# Patient Record
Sex: Male | Born: 1960 | Race: Black or African American | Hispanic: No | Smoking: Current every day smoker
Health system: Southern US, Community
[De-identification: ages and names within clinical notes are randomized; demographics above are authoritative.]

## PROBLEM LIST (undated history)

## (undated) DIAGNOSIS — I10 Essential (primary) hypertension: Secondary | ICD-10-CM

## (undated) DIAGNOSIS — E785 Hyperlipidemia, unspecified: Secondary | ICD-10-CM

## (undated) DIAGNOSIS — E119 Type 2 diabetes mellitus without complications: Secondary | ICD-10-CM

## (undated) DIAGNOSIS — Z720 Tobacco use: Secondary | ICD-10-CM

## (undated) DIAGNOSIS — I739 Peripheral vascular disease, unspecified: Secondary | ICD-10-CM

## (undated) HISTORY — DX: Tobacco use: Z72.0

## (undated) HISTORY — DX: Hyperlipidemia, unspecified: E78.5

## (undated) HISTORY — PX: OTHER SURGICAL HISTORY: SHX169

---

## 1999-07-14 ENCOUNTER — Emergency Department (HOSPITAL_COMMUNITY): Admission: EM | Admit: 1999-07-14 | Discharge: 1999-07-14 | Payer: Self-pay | Admitting: Emergency Medicine

## 2003-12-09 ENCOUNTER — Ambulatory Visit: Payer: Self-pay | Admitting: Internal Medicine

## 2003-12-24 ENCOUNTER — Ambulatory Visit (HOSPITAL_COMMUNITY): Admission: RE | Admit: 2003-12-24 | Discharge: 2003-12-24 | Payer: Self-pay | Admitting: Internal Medicine

## 2004-01-23 ENCOUNTER — Ambulatory Visit: Payer: Self-pay | Admitting: Internal Medicine

## 2004-02-24 ENCOUNTER — Ambulatory Visit: Payer: Self-pay | Admitting: Internal Medicine

## 2004-02-27 ENCOUNTER — Ambulatory Visit (HOSPITAL_COMMUNITY): Admission: RE | Admit: 2004-02-27 | Discharge: 2004-02-27 | Payer: Self-pay | Admitting: Internal Medicine

## 2004-03-31 ENCOUNTER — Ambulatory Visit: Payer: Self-pay | Admitting: Internal Medicine

## 2004-06-01 ENCOUNTER — Observation Stay (HOSPITAL_COMMUNITY): Admission: RE | Admit: 2004-06-01 | Discharge: 2004-06-02 | Payer: Self-pay | Admitting: Orthopedic Surgery

## 2008-07-15 ENCOUNTER — Emergency Department (HOSPITAL_COMMUNITY): Admission: EM | Admit: 2008-07-15 | Discharge: 2008-07-15 | Payer: Self-pay | Admitting: Emergency Medicine

## 2009-07-11 ENCOUNTER — Emergency Department (HOSPITAL_COMMUNITY): Admission: EM | Admit: 2009-07-11 | Discharge: 2009-07-11 | Payer: Self-pay | Admitting: Emergency Medicine

## 2009-10-09 ENCOUNTER — Ambulatory Visit: Payer: Self-pay | Admitting: Internal Medicine

## 2009-10-09 ENCOUNTER — Encounter: Payer: Self-pay | Admitting: Physician Assistant

## 2009-10-09 LAB — CONVERTED CEMR LAB
ALT: 22 units/L (ref 0–53)
AST: 15 units/L (ref 0–37)
Albumin: 4.6 g/dL (ref 3.5–5.2)
Alkaline Phosphatase: 78 units/L (ref 39–117)
BUN: 16 mg/dL (ref 6–23)
Basophils Absolute: 0 10*3/uL (ref 0.0–0.1)
Basophils Relative: 0 % (ref 0–1)
CO2: 23 meq/L (ref 19–32)
Calcium: 9.9 mg/dL (ref 8.4–10.5)
Chloride: 106 meq/L (ref 96–112)
Creatinine, Ser: 1.25 mg/dL (ref 0.40–1.50)
Eosinophils Absolute: 0.1 10*3/uL (ref 0.0–0.7)
Eosinophils Relative: 2 % (ref 0–5)
Glucose, Bld: 100 mg/dL — ABNORMAL HIGH (ref 70–99)
HCT: 45.1 % (ref 39.0–52.0)
Hemoglobin: 15 g/dL (ref 13.0–17.0)
Lymphocytes Relative: 40 % (ref 12–46)
Lymphs Abs: 2.2 10*3/uL (ref 0.7–4.0)
MCHC: 33.3 g/dL (ref 30.0–36.0)
MCV: 89 fL (ref 78.0–100.0)
Monocytes Absolute: 0.7 10*3/uL (ref 0.1–1.0)
Monocytes Relative: 13 % — ABNORMAL HIGH (ref 3–12)
Neutro Abs: 2.4 10*3/uL (ref 1.7–7.7)
Neutrophils Relative %: 45 % (ref 43–77)
Platelets: 232 10*3/uL (ref 150–400)
Potassium: 4.2 meq/L (ref 3.5–5.3)
RBC: 5.07 M/uL (ref 4.22–5.81)
RDW: 15.5 % (ref 11.5–15.5)
Sodium: 140 meq/L (ref 135–145)
TSH: 2.137 microintl units/mL (ref 0.350–4.500)
Total Bilirubin: 0.4 mg/dL (ref 0.3–1.2)
Total Protein: 7.9 g/dL (ref 6.0–8.3)
WBC: 5.4 10*3/uL (ref 4.0–10.5)

## 2009-10-10 ENCOUNTER — Encounter: Payer: Self-pay | Admitting: Physician Assistant

## 2009-10-23 ENCOUNTER — Ambulatory Visit: Payer: Self-pay | Admitting: Internal Medicine

## 2009-10-23 DIAGNOSIS — I10 Essential (primary) hypertension: Secondary | ICD-10-CM | POA: Insufficient documentation

## 2009-11-06 ENCOUNTER — Ambulatory Visit: Payer: Self-pay | Admitting: Physician Assistant

## 2009-11-07 LAB — CONVERTED CEMR LAB
BUN: 15 mg/dL (ref 6–23)
CO2: 24 meq/L (ref 19–32)
Calcium: 9.6 mg/dL (ref 8.4–10.5)
Chloride: 106 meq/L (ref 96–112)
Creatinine, Ser: 0.97 mg/dL (ref 0.40–1.50)
Glucose, Bld: 122 mg/dL — ABNORMAL HIGH (ref 70–99)
Potassium: 4 meq/L (ref 3.5–5.3)
Sodium: 142 meq/L (ref 135–145)

## 2010-02-13 ENCOUNTER — Ambulatory Visit: Admit: 2010-02-13 | Payer: Self-pay | Admitting: Nurse Practitioner

## 2010-02-24 NOTE — Letter (Signed)
Summary: *HSN Results Follow up  [facility]  [address] [place], [place] [address]   Phone: [phone]  Fax: [phone]      10/10/2009   GRASON DRAWHORN [address] [place], [place]  [address]   Dear  Mr. ELII OSCARSON,                            ____S.Drinkard,FNP   ____D. Gore,FNP       ____B. McPherson,MD   ____V. Rankins,MD    ____E. Mulberry,MD    ____N. Hassell Done, FNP  ____D. Jobe Igo, MD    ____K. Tomma Lightning, MD    __x__S. Kathlen Mody, PA-C     This letter is to inform you that your recent test(s):  _______Pap Smear    ___x____Lab Test     _______X-ray    ___x____ is within acceptable limits  _______ requires a medication change  _______ requires a follow-up lab visit  _______ requires a follow-up visit with your provider   Comments:  Liver, kidney, thyroid function normal.  Blood counts normal.       _________________________________________________________ If you have any questions, please contact our office                     Sincerely,  Richardson Dopp PA-C [facility]

## 2010-02-24 NOTE — Assessment & Plan Note (Signed)
Summary: Elevated BP   Vital Signs:  Patient profile:   50 year old male Height:      63 inches Weight:      125.8 pounds BMI:     22.37 Temp:     98.5 degrees F oral Pulse rate:   88 / minute Pulse rhythm:   regular Resp:     18 per minute BP sitting:   140 / 90  (left arm) Cuff size:   regular  Vitals Entered By: [place] Shannon (October 09, 2009 2:30 PM) CC: NP... pt is concerned about bp and would like a cpe... Is Patient Diabetic? No Pain Assessment Patient in pain? no       Does patient need assistance? Functional Status Self care Ambulation Normal   Primary Care Provider:  Richardson Dopp, PA-C  CC:  NP... pt is concerned about bp and would like a cpe....  History of Present Illness: Notes BP high during donating plasma.  Sounds like diastolic in [id number] range on multiple visits. Denies chest pain, sob, syncope, headaches.  No real complaints. Wants to get a physical. Needs a note that says he can donate plasma.    Habits & Providers  Alcohol-Tobacco-Diet     Tobacco Status: current  Exercise-Depression-Behavior     Drug Use: no  Current Medications (verified): 1)  None  Allergies (verified): No Known Drug Allergies  Past History:  Past Medical History: Unremarkable  Past Surgical History: Rotator cuff repair - right shoulder  Family History: Family History Diabetes 1st degree relative - bro, dad  Social History: Married-separated Current Smoker - 1/2 ppd Alcohol use-yes (on weekends) Drug use-no Smoking Status:  current Drug Use:  no  Review of Systems      See HPI General:  Denies chills and fever. CV:  Denies chest pain or discomfort and shortness of breath with exertion. Resp:  Denies cough. GI:  Denies bloody stools and dark tarry stools. GU:  Denies dysuria. Neuro:  Denies headaches.  Physical Exam  General:  alert, well-developed, and well-nourished.   Head:  normocephalic and atraumatic.   Eyes:  pupils equal, pupils  round, and pupils reactive to light.  fundi diff to visualize Neck:  supple and no carotid bruits.   Lungs:  normal breath sounds, no crackles, and no wheezes.   Heart:  normal rate and regular rhythm.   Abdomen:  soft and non-tender.   Neurologic:  alert & oriented X3 and cranial nerves II-XII intact.   Psych:  normally interactive.     Impression & Recommendations:  Problem # 1:  ELEVATED BP READING WITHOUT DX HYPERTENSION (ICD-796.2)  initiate TLC first handout on salt get ECG. . . has LVH; will likely need tx. bp checks start med if consistently above 140/90 consider echo. . . discuss at f/u  Orders: T-Comprehensive Metabolic Panel (A999333) T-CBC w/Diff [phone]) T-TSH [phone]) EKG w/ Interpretation (93000)  Problem # 2:  [place] (ICD-V70.0)  schedule cpe  Orders: T-Comprehensive Metabolic Panel (A999333)  Problem # 3:  FAMILY HISTORY DIABETES 1ST DEGREE RELATIVE (ICD-V18.0)  Orders: T-Comprehensive Metabolic Panel (A999333)  Patient Instructions: 1)  Return for BP check with the nurse in 2 weeks, then 4 weeks. 2)  Schedule CPE with Scott in 6 weeks. 3)  Try to follow low salt diet. 4)  It is important that you exercise reguarly at least 20 minutes 5 times a week. If you develop chest pain, have severe difficulty breathing, or feel very tired, stop exercising  immediately and seek medical attention.    EKG  Procedure date:  10/09/2009  Findings:      Normal sinus rhythm with rate of:  77 normal axis LVH PRWP LAE no isch changes

## 2010-02-24 NOTE — Assessment & Plan Note (Signed)
Summary: BP recheck  Nurse Visit   Vital Signs:  Patient profile:   50 year old male Pulse rate:   64 / minute Pulse rhythm:   regular Resp:     20 per minute BP sitting:   148 / 90  (right arm) Cuff size:   regular  Vitals Entered By: Sherian Maroon RN (October 23, 2009 9:32 AM)  Primary Provider:  Richardson Dopp, PA-C  CC:  BP check.  History of Present Illness: Last visit BP 140/90.  In office for f/u BP check.  Currently has no medications for treatment. Is a smoker, last cigarette was last night.   Review of Systems CV:  Denies bluish discoloration of lips or nails, chest pain or discomfort, difficulty breathing at night, difficulty breathing while lying down, fainting, fatigue, leg cramps with exertion, lightheadness, near fainting, palpitations, shortness of breath with exertion, swelling of feet, swelling of hands, and weight gain; Denies headache, visual changes..   Impression & Recommendations:  Problem # 1:  ESSENTIAL HYPERTENSION, BENIGN (ICD-401.1)  BP remains high needs tx (LVH on ECG) start hctz repeat bp and bmet in 2 weeks  His updated medication list for this problem includes:    Hydrochlorothiazide 25 Mg Tabs (Hydrochlorothiazide) .Marland Kitchen... 1/2 by mouth once daily for blood pressure  Complete Medication List: 1)  Hydrochlorothiazide 25 Mg Tabs (Hydrochlorothiazide) .... 1/2 by mouth once daily for blood pressure   Physical Exam  Lungs:  normal respiratory effort, normal breath sounds, no crackles, and no wheezes.   Heart:  normal rate and regular rhythm.     Patient Instructions: 1)  Reviewed with S. Weaver 2)  Blood pressure today is 148/90. 3)  You need to start medication. 4)  Take HCTZ 25 mg 1/2 tab once daily for blood pressure. 5)  Return in 2 weeks for labs (BMET) and BP check with the nurse.  CC: BP check Is Patient Diabetic? No Pain Assessment Patient in pain? no        Allergies: No Known Drug Allergies  Orders Added: 1)   Est. Patient Level II [id number] Prescriptions: HYDROCHLOROTHIAZIDE 25 MG TABS (HYDROCHLOROTHIAZIDE) 1/2 by mouth once daily for blood pressure  #15 x 5   Entered by:   Sherian Maroon RN   Authorized by:   Richardson Dopp PA-C   Signed by:   Sherian Maroon RN on 10/23/2009   Method used:   Print then Give to Patient   RxID:   [id number]

## 2010-02-24 NOTE — Letter (Signed)
Summary: PT INFORMATION SHEET  PT INFORMATION SHEET   Imported By: Roland Earl 10/10/2009 12:36:35  _____________________________________________________________________  External Attachment:    Type:   Image     Comment:   External Document

## 2010-06-12 NOTE — Op Note (Signed)
[name], [name] NO.:  [id number]   MEDICAL RECORD NO.:  [id number]          PATIENT TYPE:  AMB   LOCATION:  DAY                          FACILITY:  [facility]   PHYSICIAN:  Kipp Brood. Gioffre, M.D.DATE OF BIRTH:  21-Feb-1960   DATE OF PROCEDURE:  06/01/2004  DATE OF DISCHARGE:                                 OPERATIVE REPORT   SURGEON:  Kipp Brood. Gladstone Lighter, M.D.   ASSISTANT:  Mardene Sayer, P.A.   PREOPERATIVE DIAGNOSES:  1.  Complete retracted tear of the rotator cuff tendon, right shoulder.  2.  Severe impingement syndrome, right shoulder.   POSTOPERATIVE DIAGNOSES:  1.  Complete retracted tear of the rotator cuff tendon, right shoulder.  2.  Severe impingement syndrome, right shoulder.   OPERATION:  1.  A partial acromionectomy and acromioplasty, right shoulder.  2.  Repair of a severely torn retracted rotator cuff tendon, right shoulder.      I utilized the double thickness tissue mend 4 x 4 graft with three multi-      tack sutures.   DESCRIPTION OF PROCEDURE:  Under general anesthesia, a routine orthopedic  prep and drape of the right shoulder was carried out.  An incision was made  over the anterior aspect of the right shoulder.  Bleeders were identified  and cauterized.  Self-retaining retractors were inserted.  I detached the  deltoid tendon by sharp dissection from the acromion.  He had a severe  impingement-type syndrome.  The acromion was literally thickened and beak-  shaped like an eagle's beak, and after I protected the underlying soft  tissue structures and did my acromionectomy in the usual fashion, I then  identified the rotator cuff.  It was severely retracted and torn.  I had to  take it medially piece-meal and suture a portion of it to the long head of  the biceps for reinforcement there.  I had to bring some sutures across the  piece that was attached more laterally.  Once I repaired that proximal  portion, I was then able to bur down the  lateral articular surface of the  humerus.  I inserted three multi-tack suture.  Then at this particular time  I utilized a double thickness 4 x 4 tissue __________ graft.  I sutured out  to the remaining tendon, after I brought it forward as much as possible.  I  then sutured it, anchored it down in place to the proximal humerus.  I  thoroughly irrigated out the area.  We then repaired the deltoid tendon and  muscle in the usual fashion.  The subcutaneous was closed with #0 Vicryl.  The skin was closed with metal staples.  I injected 10 mL of 0.5% Marcaine  with epinephrine in the wound site.  A sterile Neosporin dressing was  applied.  He was placed in a shoulder immobilizer.      RAG/MEDQ  D:  06/01/2004  T:  06/01/2004  Job:  [id number]

## 2014-10-14 ENCOUNTER — Encounter (HOSPITAL_COMMUNITY): Payer: Self-pay | Admitting: *Deleted

## 2014-10-14 ENCOUNTER — Emergency Department (HOSPITAL_COMMUNITY)
Admission: EM | Admit: 2014-10-14 | Discharge: 2014-10-15 | Disposition: A | Discharge status: Home / Self Care | Payer: Medicare Other | Attending: Emergency Medicine | Admitting: Emergency Medicine

## 2014-10-14 DIAGNOSIS — F1010 Alcohol abuse, uncomplicated: Secondary | ICD-10-CM | POA: Diagnosis not present

## 2014-10-14 DIAGNOSIS — F1410 Cocaine abuse, uncomplicated: Secondary | ICD-10-CM | POA: Diagnosis not present

## 2014-10-14 DIAGNOSIS — Z720 Tobacco use: Secondary | ICD-10-CM | POA: Insufficient documentation

## 2014-10-14 DIAGNOSIS — F1910 Other psychoactive substance abuse, uncomplicated: Secondary | ICD-10-CM

## 2014-10-14 DIAGNOSIS — F1210 Cannabis abuse, uncomplicated: Secondary | ICD-10-CM | POA: Insufficient documentation

## 2014-10-14 LAB — RAPID URINE DRUG SCREEN, HOSP PERFORMED
Amphetamines: NOT DETECTED
Barbiturates: NOT DETECTED
Benzodiazepines: NOT DETECTED
Cocaine: POSITIVE — AB
Opiates: NOT DETECTED
Tetrahydrocannabinol: POSITIVE — AB

## 2014-10-14 LAB — CBC
HCT: 46.2 % (ref 39.0–52.0)
Hemoglobin: 14.8 g/dL (ref 13.0–17.0)
MCH: 30.1 pg (ref 26.0–34.0)
MCHC: 32 g/dL (ref 30.0–36.0)
MCV: 93.9 fL (ref 78.0–100.0)
Platelets: 311 10*3/uL (ref 150–400)
RBC: 4.92 MIL/uL (ref 4.22–5.81)
RDW: 14.8 % (ref 11.5–15.5)
WBC: 5.5 10*3/uL (ref 4.0–10.5)

## 2014-10-14 LAB — ETHANOL: Alcohol, Ethyl (B): <5 mg/dL (ref <5)

## 2014-10-14 LAB — ACETAMINOPHEN LEVEL: Acetaminophen (Tylenol), Serum: <10 ug/mL — ABNORMAL LOW (ref 10–30)

## 2014-10-14 LAB — COMPREHENSIVE METABOLIC PANEL
ALT: 21 U/L (ref 17–63)
AST: 24 U/L (ref 15–41)
Albumin: 3.2 g/dL — ABNORMAL LOW (ref 3.5–5.0)
Alkaline Phosphatase: 84 U/L (ref 38–126)
Anion gap: 8 (ref 5–15)
BUN: 19 mg/dL (ref 6–20)
CO2: 23 mmol/L (ref 22–32)
Calcium: 9.1 mg/dL (ref 8.9–10.3)
Chloride: 110 mmol/L (ref 101–111)
Creatinine, Ser: 1.2 mg/dL (ref 0.61–1.24)
GFR calc Af Amer: >60 mL/min (ref >60)
GFR calc non Af Amer: >60 mL/min (ref >60)
Glucose, Bld: 87 mg/dL (ref 65–99)
Potassium: 4.3 mmol/L (ref 3.5–5.1)
Sodium: 141 mmol/L (ref 135–145)
Total Bilirubin: 0.4 mg/dL (ref 0.3–1.2)
Total Protein: 6 g/dL — ABNORMAL LOW (ref 6.5–8.1)

## 2014-10-14 LAB — SALICYLATE LEVEL: Salicylate Lvl: <4 mg/dL (ref 2.8–30.0)

## 2014-10-14 MED ORDER — THIAMINE HCL 100 MG/ML IJ SOLN: 100.0000 mg | Freq: Every day | INTRAMUSCULAR | Status: DC

## 2014-10-14 MED ORDER — VITAMIN B-1 100 MG PO TABS: 100.0000 mg | ORAL_TABLET | Freq: Every day | ORAL | Status: DC

## 2014-10-14 MED ORDER — LORAZEPAM 1 MG PO TABS: 0.0000 mg | ORAL_TABLET | Freq: Four times a day (QID) | ORAL | Status: DC

## 2014-10-14 MED ORDER — LORAZEPAM 1 MG PO TABS: 0.0000 mg | ORAL_TABLET | Freq: Two times a day (BID) | ORAL | Status: DC

## 2014-10-14 NOTE — ED Notes (Signed)
Staffing notified of need of sitter. Pt changed in paper scrubs. Security notified to wand pt.

## 2014-10-14 NOTE — ED Notes (Signed)
PA at bedside.

## 2014-10-14 NOTE — ED Notes (Signed)
Pt requesting detox from alcohol, cocaine and marijuana. Last alcohol, cocaine and marijuana use was last night. Requesting to be sent to rehab facility.

## 2014-10-14 NOTE — ED Provider Notes (Signed)
CSN: [id number]     Arrival date & time 10/14/14  1752 History  This chart was scribed for non-physician practitioner Hyman Bible, PA-C working with Charlesetta Shanks, MD by Hilda Lias, ED Scribe. This patient was seen in room C21C/C21C and the patient's care was started at 9:33 PM.  Chief Complaint  Patient presents with  . Alcohol Problem  . Drug Problem  . Homicidal      The history is provided by the patient. No language interpreter was used.   HPI Comments: NEEV MCBURNIE is a 54 y.o. male who presents to the Emergency Department requesting detox from alcohol, cocaine, and marijuana use. Pt states that his last use of all three was last night, and states that he uses all three of them at the same time every other day. Pt denies any symptoms of withdrawal at this time.   No history of DT's.  Pt states he has had homicidal ideations, and states he thought about stabbing someone and killing them earlier because they took his money.  Denies SI.  He denies any physical symptoms at this time.     History reviewed. No pertinent past medical history. Past Surgical History  Procedure Laterality Date  . Arm surgery     No family history on file. Social History  Substance Use Topics  . Smoking status: Current Every Day Smoker -- 0.50 packs/day    Types: Cigarettes  . Smokeless tobacco: None  . Alcohol Use: Yes     Comment: beer and liquor daily    Review of Systems  Psychiatric/Behavioral: Positive for behavioral problems. Negative for suicidal ideas and hallucinations.  All other systems reviewed and are negative.     Allergies  Review of patient's allergies indicates no known allergies.  Home Medications   Prior to Admission medications   Not on File   BP 129/76 mmHg  Pulse 80  Temp(Src) 98.4 F (36.9 C) (Oral)  Resp 18  Ht 5\' 2"  (1.575 m)  Wt 120 lb (54.432 kg)  BMI 21.94 kg/m2  SpO2 98% Physical Exam  Constitutional: He is oriented to person, place, and  time. He appears well-developed and well-nourished.  HENT:  Head: Normocephalic and atraumatic.  Neck: Normal range of motion. Neck supple.  Cardiovascular: Normal rate, regular rhythm and normal heart sounds.   Pulmonary/Chest: Effort normal and breath sounds normal.  Abdominal: He exhibits no distension.  Musculoskeletal: Normal range of motion.  Neurological: He is alert and oriented to person, place, and time.  Skin: Skin is warm and dry.  Psychiatric: He has a normal mood and affect. His speech is normal. He is not actively hallucinating. He expresses no homicidal and no suicidal ideation. He expresses no suicidal plans and no homicidal plans.  Nursing note and vitals reviewed.   ED Course  Procedures (including critical care time)  DIAGNOSTIC STUDIES: Oxygen Saturation is 98% on room air, normal by my interpretation.    COORDINATION OF CARE: 9:36 PM Discussed treatment plan with pt at bedside and pt agreed to plan.   Labs Review Labs Reviewed  COMPREHENSIVE METABOLIC PANEL - Abnormal; Notable for the following:    Total Protein 6.0 (*)    Albumin 3.2 (*)    All other components within normal limits  ACETAMINOPHEN LEVEL - Abnormal; Notable for the following:    Acetaminophen (Tylenol), Serum <10 (*)    All other components within normal limits  ETHANOL  SALICYLATE LEVEL  CBC  URINE RAPID DRUG SCREEN, HOSP PERFORMED  Imaging Review No results found. I have personally reviewed and evaluated these images and lab results as part of my medical decision-making.   EKG Interpretation None      MDM   Final diagnoses:  None  Patient presents today requesting detox from alcohol and cocaine.  He is also expressing HI with a plan to stab a person that took his money.  He denies HI.  VSS.  He denies physical complaints.  Labs unremarkable.  CIWA orders placed.  Psych holding orders also placed.  TTS consulted.  I personally performed the services described in this  documentation, which was scribed in my presence. The recorded information has been reviewed and is accurate.    Hyman Bible, PA-C 10/14/14 2329  Charlesetta Shanks, MD 10/15/14 [phone]

## 2014-10-15 ENCOUNTER — Emergency Department (HOSPITAL_COMMUNITY)
Admission: EM | Admit: 2014-10-15 | Discharge: 2014-10-15 | Disposition: A | Discharge status: Home / Self Care | Payer: Medicare Other | Source: Home / Self Care | Attending: Emergency Medicine | Admitting: Emergency Medicine

## 2014-10-15 ENCOUNTER — Emergency Department (HOSPITAL_COMMUNITY)
Admission: EM | Admit: 2014-10-15 | Discharge: 2014-10-15 | Disposition: A | Discharge status: Home / Self Care | Payer: Medicaid Other | Attending: Emergency Medicine | Admitting: Emergency Medicine

## 2014-10-15 ENCOUNTER — Encounter (HOSPITAL_COMMUNITY): Payer: Self-pay | Admitting: *Deleted

## 2014-10-15 ENCOUNTER — Encounter (HOSPITAL_COMMUNITY): Payer: Self-pay

## 2014-10-15 DIAGNOSIS — F1210 Cannabis abuse, uncomplicated: Secondary | ICD-10-CM | POA: Insufficient documentation

## 2014-10-15 DIAGNOSIS — Z720 Tobacco use: Secondary | ICD-10-CM | POA: Insufficient documentation

## 2014-10-15 DIAGNOSIS — F1010 Alcohol abuse, uncomplicated: Secondary | ICD-10-CM

## 2014-10-15 DIAGNOSIS — F1410 Cocaine abuse, uncomplicated: Secondary | ICD-10-CM | POA: Insufficient documentation

## 2014-10-15 DIAGNOSIS — F1910 Other psychoactive substance abuse, uncomplicated: Secondary | ICD-10-CM

## 2014-10-15 DIAGNOSIS — E119 Type 2 diabetes mellitus without complications: Secondary | ICD-10-CM | POA: Insufficient documentation

## 2014-10-15 HISTORY — DX: Type 2 diabetes mellitus without complications: E11.9

## 2014-10-15 NOTE — BH Assessment (Signed)
Tele Assessment Note   [name] is a 54 y.o. male who voluntarily presents to [facility] for detox.  Upon arrival, pt told medical staff that he was HI and thought about stabbing someone and killing them because they took his money.  He no longer endorses HI and denies SI/AVH.  Pt reports the following: he drinks 2-40's, daily and his last drink was 10/13/14.  Pt.'s current BAL <5.  He also uses $20 worth of cocaine, every other day.  His last use was 10/13/14, he used $50 worth of cocaine.  Pt smokes 2 blunts, every other day and his last use was 10/13/14, he used  $20 worth of marijuana.  Pr denies w/d sxs, no seizure/blackout hx and no legal issues.  Pt has no past detox/rehab hx and is requesting help with rehab.    Axis I: Alcohol use disorder, Severe; Cannabis use disorder, Moderate;Cocaine use disorder, Severe  Axis II: Deferred Axis III: History reviewed. No pertinent past medical history. Axis IV: other psychosocial or environmental problems, problems related to social environment and problems with primary support group Axis V: 41-50 serious symptoms  Past Medical History: History reviewed. No pertinent past medical history.  Past Surgical History  Procedure Laterality Date  . Arm surgery      Family History: No family history on file.  Social History:  reports that he has been smoking Cigarettes.  He has been smoking about 0.50 packs per day. He does not have any smokeless tobacco history on file. He reports that he drinks alcohol. He reports that he uses illicit drugs (Cocaine and Marijuana) about 3 times per week.  Additional Social History:  Alcohol / Drug Use Pain Medications: None  Prescriptions: None  Over the Counter: None  History of alcohol / drug use?: Yes Longest period of sobriety (when/how long): None  Negative Consequences of Use: Work / Youth worker, Charity fundraiser relationships Withdrawal Symptoms: Other (Comment) (No current w/d sxs ) Substance #1 Name of Substance 1:  Alcohol  1 - Age of First Use: 15 YOM  1 - Amount (size/oz): 2-40's 1 - Frequency: Daily  1 - Duration: On-going  1 - Last Use / Amount: 10/13/14 Substance #2 Name of Substance 2: Cocaine  2 - Age of First Use: 20 YOM  2 - Amount (size/oz): $20 2 - Frequency: Every Other Day  2 - Duration: On-going  2 - Last Use / Amount: 10/13/14 Substance #3 Name of Substance 3: THC  3 - Age of First Use: 11 YOM  3 - Amount (size/oz): 2 Blunts  3 - Frequency: Every Other Day  3 - Duration: On-going  3 - Last Use / Amount: 10/13/14  CIWA: CIWA-Ar BP: 129/77 mmHg Pulse Rate: 79 Nausea and Vomiting: no nausea and no vomiting Tactile Disturbances: none Tremor: no tremor Auditory Disturbances: not present Paroxysmal Sweats: no sweat visible Visual Disturbances: not present Anxiety: no anxiety, at ease Headache, Fullness in Head: none present Agitation: normal activity Orientation and Clouding of Sensorium: oriented and can do serial additions CIWA-Ar Total: 0 COWS: Clinical Opiate Withdrawal Scale (COWS) Resting Pulse Rate: Pulse Rate 80 or below Sweating: No report of chills or flushing Restlessness: Able to sit still Pupil Size: Pupils pinned or normal size for room light Bone or Joint Aches: Not present Runny Nose or Tearing: Not present GI Upset: No GI symptoms Tremor: No tremor Yawning: No yawning Anxiety or Irritability: None Gooseflesh Skin: Skin is smooth COWS Total Score: 0  PATIENT STRENGTHS: (choose at  least two) Armed forces logistics/support/administrative officer Motivation for treatment/growth  Allergies: No Known Allergies  Home Medications:  (Not in a hospital admission)  OB/GYN Status:  No LMP for male patient.  General Assessment Data Location of Assessment: [facility] ED TTS Assessment: In system Is this a Tele or Face-to-Face Assessment?: Tele Assessment Is this an Initial Assessment or a Re-assessment for this encounter?: Initial Assessment Marital status: Single Maiden name: None  Is  patient pregnant?: No Pregnancy Status: No Living Arrangements: Alone Can pt return to current living arrangement?: Yes Admission Status: Voluntary Is patient capable of signing voluntary admission?: Yes Referral Source: MD Insurance type: MCD  Medical Screening Exam ([place]) Medical Exam completed: No Reason for MSE not completed: Other: (None )  Crisis Care Plan Living Arrangements: Alone Name of Psychiatrist: None  Name of Therapist: None   Education Status Is patient currently in school?: No Current Grade: None  Highest grade of school patient has completed: None  Name of school: None  Contact person: None   Risk to self with the past 6 months Suicidal Ideation: No Has patient been a risk to self within the past 6 months prior to admission? : No Suicidal Intent: No Has patient had any suicidal intent within the past 6 months prior to admission? : No Is patient at risk for suicide?: No Suicidal Plan?: No Has patient had any suicidal plan within the past 6 months prior to admission? : No Access to Means: No What has been your use of drugs/alcohol within the last 12 months?: Pt denies  Previous Attempts/Gestures: No How many times?: 0 Other Self Harm Risks: None Triggers for Past Attempts: None known Intentional Self Injurious Behavior: None Family Suicide History: No Recent stressful life event(s): Other (Comment) (Chronic SA ) Persecutory voices/beliefs?: No Depression: Yes Depression Symptoms: Loss of interest in usual pleasures Substance abuse history and/or treatment for substance abuse?: Yes Suicide prevention information given to non-admitted patients: Not applicable  Risk to Others within the past 6 months Homicidal Ideation: No Does patient have any lifetime risk of violence toward others beyond the six months prior to admission? : No Thoughts of Harm to Others: No Current Homicidal Intent: No Current Homicidal Plan: No Access to Homicidal  Means: No Identified Victim: None  History of harm to others?: No Assessment of Violence: None Noted Violent Behavior Description: None  Does patient have access to weapons?: No Criminal Charges Pending?: No Does patient have a court date: No Is patient on probation?: No  Psychosis Hallucinations: None noted Delusions: None noted  Mental Status Report Appearance/Hygiene: In scrubs Eye Contact: Fair Motor Activity: Unremarkable Speech: Logical/coherent Level of Consciousness: Alert, Quiet/awake Mood: Other (Comment) (Appropriate ) Affect: Appropriate to circumstance Anxiety Level: None Thought Processes: Coherent, Relevant Judgement: Unimpaired Orientation: Person, Place, Time, Situation Obsessive Compulsive Thoughts/Behaviors: None  Cognitive Functioning Concentration: Normal Memory: Recent Intact, Remote Intact IQ: Average Insight: Fair Impulse Control: Fair Appetite: Good Weight Loss: 0 Weight Gain: 0 Sleep: No Change Total Hours of Sleep: 5 Vegetative Symptoms: None  ADLScreening [facility] Assessment Services) Patient's cognitive ability adequate to safely complete daily activities?: Yes Patient able to express need for assistance with ADLs?: Yes Independently performs ADLs?: Yes (appropriate for developmental age)  Prior Inpatient Therapy Prior Inpatient Therapy: No Prior Therapy Dates: None  Prior Therapy Facilty/Shanielle Correll(s): None  Reason for Treatment: None   Prior Outpatient Therapy Prior Outpatient Therapy: No Prior Therapy Dates: None  Prior Therapy Facilty/Jackelin Correia(s): None  Reason for Treatment: None  Does  patient have an ACCT team?: No Does patient have Intensive In-House Services?  : No Does patient have [facility] services? : No Does patient have P4CC services?: No  ADL Screening (condition at time of admission) Patient's cognitive ability adequate to safely complete daily activities?: Yes Is the patient deaf or have difficulty hearing?: No Does  the patient have difficulty seeing, even when wearing glasses/contacts?: No Does the patient have difficulty concentrating, remembering, or making decisions?: Yes Patient able to express need for assistance with ADLs?: Yes Does the patient have difficulty dressing or bathing?: No Independently performs ADLs?: Yes (appropriate for developmental age) Does the patient have difficulty walking or climbing stairs?: No Weakness of Legs: None Weakness of Arms/Hands: None  Home Assistive Devices/Equipment Home Assistive Devices/Equipment: None  Therapy Consults (therapy consults require a physician order) PT Evaluation Needed: No OT Evalulation Needed: No SLP Evaluation Needed: No Abuse/Neglect Assessment (Assessment to be complete while patient is alone) Physical Abuse: Denies Verbal Abuse: Denies Sexual Abuse: Denies Exploitation of patient/patient's resources: Denies Self-Neglect: Denies Values / Beliefs Cultural Requests During Hospitalization: None Spiritual Requests During Hospitalization: None Consults Spiritual Care Consult Needed: No Social Work Consult Needed: No Regulatory affairs officer (For Healthcare) Does patient have an advance directive?: No Would patient like information on creating an advanced directive?: No - patient declined information    Additional Information 1:1 In Past 12 Months?: No CIRT Risk: No Elopement Risk: No Does patient have medical clearance?: Yes     Disposition:  Disposition Initial Assessment Completed for this Encounter: Yes Disposition of Patient: Referred to (Per Patriciaann Clan, PA recommends ) Patient referred to: Other (Comment) (Per Patriciaann Clan, PA recommends )  Polo Riley C 10/15/2014 12:29 AM

## 2014-10-15 NOTE — ED Provider Notes (Signed)
CSN: [id number]     Arrival date & time 10/15/14  1348 History   First MD Initiated Contact with Patient 10/15/14 1505     Chief Complaint  Patient presents with  . Addiction Problem     (Consider location/radiation/quality/duration/timing/severity/associated sxs/prior Treatment) The history is provided by the patient.  [name] is a 54 y.o. male hx of DM, drug and alcohol abuse here requesting detox and a place to stay. He went to [facility] earlier today for cocaine, marijuana, alcohol abuse and was given a list of resources. [facility] and is suppose to go there in a week for 90 day detox. He is homeless and requesting a place to stay. Denies chest pain or abdominal pain or vomiting. Denies fevers or shakiness or seizures. He doesn't want medications to help him with detox.   Past Medical History  Diagnosis Date  . Diabetes mellitus without complication    Past Surgical History  Procedure Laterality Date  . Arm surgery     No family history on file. Social History  Substance Use Topics  . Smoking status: Current Every Day Smoker -- 0.50 packs/day    Types: Cigarettes  . Smokeless tobacco: None  . Alcohol Use: Yes     Comment: beer and liquor daily    Review of Systems  Psychiatric/Behavioral: Negative for suicidal ideas, sleep disturbance and self-injury.  All other systems reviewed and are negative.     Allergies  Review of patient's allergies indicates no known allergies.  Home Medications   Prior to Admission medications   Not on File   BP 151/87 mmHg  Pulse 70  Temp(Src) 98.1 F (36.7 C) (Oral)  Resp 20  SpO2 99% Physical Exam  Constitutional: He appears well-developed and well-nourished.  Disheveled   HENT:  Head: Normocephalic.  Mouth/Throat: Oropharynx is clear and moist.  Eyes: Conjunctivae are normal. Pupils are equal, round, and reactive to light.  Neck: Normal range of motion. Neck supple.  Cardiovascular: Normal rate, regular  rhythm and normal heart sounds.   Pulmonary/Chest: Effort normal and breath sounds normal. No respiratory distress. He has no wheezes. He has no rales.  Abdominal: Soft. Bowel sounds are normal. He exhibits no distension. There is no tenderness. There is no rebound.  Musculoskeletal: Normal range of motion.  Neurological: He is alert. No cranial nerve deficit. Coordination normal.  Skin: Skin is warm and dry.  Psychiatric: He has a normal mood and affect. His behavior is normal. Judgment and thought content normal.  Nursing note and vitals reviewed.   ED Course  Procedures (including critical care time) Labs Review Labs Reviewed - No data to display  Imaging Review No results found. I have personally reviewed and evaluated these images and lab results as part of my medical decision-making.   EKG Interpretation None      MDM   Final diagnoses:  None   [name] is a 54 y.o. male here wanting a place to stay. Doesn't appear intoxicated. Vitals stable. Not in withdrawal. Patient is suppose to go to [facility] in a week. Contacted social work, unfortunately its too late to get into a shelter today. Will give list of resources. Will discharge      Wandra Arthurs, MD 10/15/14 [phone]

## 2014-10-15 NOTE — Discharge Instructions (Signed)
You were seen today for alcohol and drug abuse. You will be given outpatient resources for detox.

## 2014-10-15 NOTE — ED Notes (Signed)
NAD at this time. Pt is calling family to pick him up.

## 2014-10-15 NOTE — ED Notes (Signed)
Per pt he wants to detox from marijuana, cocaine, alcohol. Pt states that he checked back in because he can't utilize his resources until 8 am. The pts last substance use was Sunday.

## 2014-10-15 NOTE — ED Notes (Signed)
Social worker bringing pt resources.

## 2014-10-15 NOTE — ED Provider Notes (Signed)
Spoke with behavioral health. Patient does not meet inpatient criteria. Will sign a safety contract regarding verbalization of HI. Patient reportedly has anger issues toward someone is to lose money.  I have reviewed initial history and physical exam. Resources given for outpatient detox.  Merryl [name], MD 10/15/14 [phone]

## 2014-10-15 NOTE — ED Notes (Signed)
Yao MD at bedside. 

## 2014-10-15 NOTE — Discharge Instructions (Signed)
Call shelters for a place to stay.   [facility] for [facility].   Return to ER if you have thoughts of harming yourself or others, hallucinations, seizures, withdrawal.    Emergency Department Resource Guide 1) Find a Doctor and Pay Out of Pocket Although you won't have to find out who is covered by your insurance plan, it is a good idea to ask around and get recommendations. You will then need to call the office and see if the doctor you have chosen will accept you as a new patient and what types of options they offer for patients who are self-pay. Some doctors offer discounts or will set up payment plans for their patients who do not have insurance, but you will need to ask so you aren't surprised when you get to your appointment.  2) Contact Your Local Health Department Not all health departments have doctors that can see patients for sick visits, but many do, so it is worth a call to see if yours does. If you don't know where your local health department is, you can check in your phone book. The CDC also has a tool to help you locate your state's health department, and many state websites also have listings of all of their local health departments.  3) Find a [facility] If your illness is not likely to be very severe or complicated, you may want to try a walk in clinic. These are popping up all over the country in pharmacies, drugstores, and shopping centers. They're usually staffed by nurse practitioners or physician assistants that have been trained to treat common illnesses and complaints. They're usually fairly quick and inexpensive. However, if you have serious medical issues or chronic medical problems, these are probably not your best option.  No Primary Care Doctor: - Call Health Connect at  [phone] - they can help you locate a primary care doctor that  accepts your insurance, provides certain services, etc. - Physician Referral Service- [phone]  Chronic Pain  Problems: Organization         Address  Phone   Notes  [facility]  [phone] Patients need to be referred by their primary care doctor.   Medication Assistance: Organization         Address  Phone   Notes  [facility] Medication [facility] [address] [place], [place] [address] [phone] --Must be a resident of [facility] -- Must have NO insurance coverage whatsoever (no Medicaid/ Medicare, etc.) -- The pt. MUST have a primary care doctor that directs their care regularly and follows them in the community   MedAssist  [phone]   [employer]  [phone]    Agencies that provide inexpensive medical care: Organization         Address  Phone   Notes  [place]  [phone]   [facility]    [phone]   [facility] [place], [place] [address] [phone]   [place] [address], [place] [phone]   Planned Parenthood    [phone]   [facility]    [phone]   [place] and [place] [address],  Phone:  [phone], Fax:  [phone] Hours of Operation:  9 am - 6 pm, M-F.  Also accepts Medicaid/Medicare and self-pay.  [facility]  [place] [employer], [address], [place] Phone: [phone], Fax: [phone]. Hours of Operation:  8:30 am - 5:30 pm, M-F.  Also accepts Medicaid and self-pay.  [facility] [place] [address], [place] Phone: [phone]   [place], [place], [place] [phone], Ext. [phone] Mondays & Thursdays: 7-9 AM.  First 15 patients are seen on a first come, first serve basis.    [place] Providers:  Organization         Address  Phone   Notes  [facility] [address], [place] [phone] Also  accepts self-pay patients.  [facility] [address], [place]  [phone]   [place], [address], [place] [phone]   [facility] [address], [place] [phone]   Lucianne Lei [address], [place]   [phone] Only accepts [place] Access [place] patients after they have their name applied to their card.   Self-Pay (no insurance) in [facility]:  Organization         Address  Phone   Notes  Sickle Cell Patients, [facility] Internal Medicine [place] [phone]   [facility] Urgent Care [place] [phone]   [facility] [place]  [place], [place], [place] [phone]   [facility]/Dr. Osei-Bonsu  [address] [place] or [address], [place] [phone] Phone number for both [place] and [place] locations is the same.  Urgent Medical and [facility] [address], [place] [phone]   [facility] [address] [place] or [address] [phone]   [facility] [address] [place] [phone], phone; [phone], fax Sees patients 1st and 3rd Saturday of every month.  Must not qualify for public or private insurance (i.e. Medicaid, Medicare, [place] Health Choice, Veterans' Benefits)  Household income should be no more than 200% of the poverty level The clinic cannot treat you if you are pregnant or think you are pregnant  Sexually transmitted diseases are not treated at the clinic.    Dental Care: Organization         Address  Phone  Notes  [facility] Department of [facility] [place] [phone] Accepts children up to age 37 who are enrolled in [place] or [place]; pregnant  women with a Medicaid card; and children who have applied for Medicaid or [place] Health Choice, but were declined, whose parents can pay a reduced fee at time of service.  [facility] Department of [facility]  [address], [place] [phone] Accepts children up to age 58 who are enrolled in [place] or [place]; pregnant women with a Medicaid card; and children who have applied for Medicaid or [place] Health Choice, but were declined, whose parents can pay a reduced fee at time of service.  [place] Adult Dental Access PROGRAM  [place] [phone] Patients are seen by appointment only. Walk-ins are not accepted. [place] will see patients 22 years of age and older. Monday - Tuesday (8am-5pm) Most Wednesdays (8:30-5pm) $30 per visit, cash only  [place] Adult  Dental Access PROGRAM  [address], [facility] [phone] Patients are seen by appointment only. Walk-ins are not accepted. [place] will see patients 60 years of age and older. One Wednesday Evening (Monthly: Volunteer Based).  $30 per visit, cash only  [place]  [phone] for adults; Children under age 4, call Graduate Pediatric Dentistry at [phone]. Children aged 49-14, please call [phone] to request a pediatric application.  Dental services are provided in all areas of dental care including fillings, crowns and bridges, complete and partial dentures, implants, gum treatment, root canals, and extractions. Preventive care is also provided. Treatment is provided to both adults and children. Patients are selected via a lottery and there is often a waiting list.   [facility] [address] [place]  [phone] [email or web address]   [facility] [address] [place], [place] [phone], Ext. [phone] Second and Fourth Thursday of each month, opens at 6:30 AM; Clinic ends at 9 AM.  Patients are  seen on a first-come first-served basis, and a limited number are seen during each clinic.   [facility]  [address] Hillard Danker [place], [place] [phone]   Eligibility Requirements You must have lived in [place], [place], or [place] counties for at least the last three months.   You cannot be eligible for state or federal sponsored [employer], including [employer], [place], or [employer].   You generally cannot be eligible for healthcare insurance through your employer.    How to apply: Eligibility screenings are held every Tuesday and Wednesday afternoon from 1:00 pm until 4:00 pm. You do not need an appointment for the interview!  [facility] [address] [place], [place]   [place]  [place] Department  [place]  [phone]    Behavioral Health Resources in the Community: Intensive Outpatient Programs Organization         Address  Phone  Notes  Deport B[place]ler. [address], [place], [place] [phone]   [facility] Outpatient [address], [place], [place]   ADS: Alcohol & Drug Svcs [address], [place], [place]   [place] [address],  [place], [place] or [phone]   Substance Abuse Resources Organization         Address  Phone  Notes  Alcohol and Drug Services  [phone]   [place]  [phone]   The [place]   Chinita Pester  [phone]   Residential & Outpatient Substance Abuse Program  [phone]   Psychological Services Organization         Address  Phone  Notes  [facility] [place]  [place]  [phone]   [place] [address] [place] [phone] or [phone]    Mobile Crisis  Teams Organization         Address  Phone  Notes  [facility] Alternatives, Mobile Crisis Care Unit  [phone]   [facility]ive Psychotherapeutic Services  [address] [place], [place]   [place] Levels [address] [place] [phone]    Self-Help/Support Groups Organization         Address  Phone             Notes  Mental  Health Assoc. of [place] - variety of support groups  [place] Call for more information  Narcotics Anonymous (NA), [facility]aring [facility] [address], [employer] [place]  2 meetings at this location   Special educational needs teacher         Address  Phone  Notes  [facility]AP Residential Treatment [place],    [place]  [phone]   [facility]  [address], [place] [address], [place], [place]   [place], [place] [phone] Admissions: 8am-3pm M-F  Incentives Substance [place] [address],    [place], [place] [phone]   [facility] [address] [place], [place], [place]   [facility] [address]  [place], [place]   [facility]ight Programs - Intensive Outpatient [address] Kristeen [address], [place], [place]   [facility] ([place] [place].,  [place], [place] [phone] or [phone]   Residential Treatment Services (RTS) [address] [place], [place] Accepts Medicaid  [facility] [place] [address],  [place] [phone] Substance Abuse/Addiction Treatment   [facility] Organization         Address  Phone  Notes  [facility]  [phone]   Domenic Schwab, PhD [address] Arlis [place], [place]   [phone] or [phone]   [place], [place] [phone]   [facility] [address], [place], [place] [phone]  Insurance/Medicaid/sponsorship through [facility] and Families [address]                                    [place], [place] [phone] [place] [address], [place] [phone]    Dr. Adele Schilder  [phone]   [facility] Dept. 1) [address] [place] 2) [place] 3)  [place] [address], [place] [phone]  [phone]   [place] [phone] or [phone] (After Hours)

## 2014-10-15 NOTE — Progress Notes (Signed)
Patient was seen by LCSW this morning as he had been discharged and awaiting for SW in the lobby. Patient reports he is wanting help with substance, reporting: cocaine, etoh, and THC.  Patient is not appropriate for detox as he is not actively detoxing from substance and no UDS/tox report completed.   Assessment screening date given: October 22, 2014. ADS information given as walk in today appointments [facility] information given [facility] information given.  Patient also given a bus pass.  Lane [name], MSW Clinical Social Work: Emergency Room [phone]

## 2014-10-15 NOTE — ED Notes (Signed)
Contract for safety sign by pt and placed in chart per [facility] request

## 2014-10-15 NOTE — ED Provider Notes (Signed)
CSN: [id number]     Arrival date & time 10/15/14  0603 History   First MD Initiated Contact with Patient 10/15/14 [phone]     Chief Complaint  Patient presents with  . detox      (Consider location/radiation/quality/duration/timing/severity/associated sxs/prior Treatment) HPI   Pt is a 54 year-old male with history of polysubstance abuse.  He was evaluated yesterday in the emergency department due to seeking detox, and was subsequently did not meet inpatient psych criteria Patient states that he last used marijuana, cocaine and alcohol 2 nights ago prior to his evaluation in the ER. He was given resources and states that he has a number to call at 8 AM. He decided to come back to the ER this morning because he felt that he would go and buy more drugs and was worried about unintentionally [place].  He also complains of being in withdrawal.  He claims he is shaky, but denies any sweats, chills, abdominal pain, nausea, vomiting, diarrhea, agitation He denies any suicidal ideations, does not have a plan, denies HI, states that he hears voices which tell him "you can get better, even stop this," he does not have any visual hallucinations.  He is asking for a sandwich.  History reviewed. No pertinent past medical history. Past Surgical History  Procedure Laterality Date  . Arm surgery     No family history on file. Social History  Substance Use Topics  . Smoking status: Current Every Day Smoker -- 0.50 packs/day    Types: Cigarettes  . Smokeless tobacco: None  . Alcohol Use: Yes     Comment: beer and liquor daily    Review of Systems    Allergies  Review of patient's allergies indicates no known allergies.  Home Medications   Prior to Admission medications   Not on File   BP 154/83 mmHg  Pulse 70  Temp(Src) 98.4 F (36.9 C) (Oral)  Resp 16  Ht 5\' 2"  (1.575 m)  Wt 120 lb (54.432 kg)  BMI 21.94 kg/m2  SpO2 98% Physical Exam  Constitutional: He is oriented to person, place, and  time. He appears well-developed and well-nourished. No distress.  Cachectic male, appears older than stated age, non-toxic appearing, no distress, pt calm and laying in ER gurney  HENT:  Head: Normocephalic and atraumatic.  Nose: Nose normal.  Mouth/Throat: Oropharynx is clear and moist. No oropharyngeal exudate.  Poor dentition, multiple missing and decayed teeth.  Oral mucosa moist  Eyes: Conjunctivae and EOM are normal. Pupils are equal, round, and reactive to light. Right eye exhibits no discharge. Left eye exhibits no discharge. No scleral icterus.  Neck: Normal range of motion. No JVD present. No tracheal deviation present. No thyromegaly present.  Cardiovascular: Normal rate, regular rhythm, normal heart sounds and intact distal pulses.  Exam reveals no gallop and no friction rub.   No murmur heard. Pulmonary/Chest: Effort normal and breath sounds normal. No respiratory distress. He has no wheezes. He has no rales. He exhibits no tenderness.  Abdominal: Soft. Normal appearance and bowel sounds are normal. He exhibits no distension and no mass. There is no hepatosplenomegaly. There is no tenderness. There is no rebound and no guarding.  Musculoskeletal: Normal range of motion. He exhibits no edema or tenderness.  Lymphadenopathy:    He has no cervical adenopathy.  Neurological: He is alert and oriented to person, place, and time. He has normal reflexes. No cranial nerve deficit. He exhibits normal muscle tone. Coordination normal.  Skin: Skin is  warm and dry. No rash noted. He is not diaphoretic. No erythema. No pallor.  Psychiatric: He has a normal mood and affect. His speech is normal and behavior is normal. Judgment and thought content normal. His mood appears not anxious. Thought content is not paranoid. Cognition and memory are normal. He does not express impulsivity. He does not exhibit a depressed mood. He expresses no homicidal and no suicidal ideation. He expresses no suicidal plans  and no homicidal plans.  Nursing note and vitals reviewed.   ED Course  Procedures (including critical care time) Labs Review Labs Reviewed - No data to display  Imaging Review No results found. I have personally reviewed and evaluated these images and lab results as part of my medical decision-making.   EKG Interpretation None      MDM   Final diagnoses:  None    Pt has no acute issues.  He states that he only came back to the ER because otherwise, if he had to "be out there" he would use drugs, and he is trying to avoid drug use until 8am when he can contact a facility whose information he was given yesterday.  He was well appearing, with stable vital signs, no pain complaints, and non-concerning exam. He denies HI, SI, visual hallucinations.  The "voices" he states he is hearing appear to be his own positive thoughts encouraging him to detox and change his life for the better, and I do not believe they represent auditory hallucinations.  He was given food and drink in the ED, and was discharged and invited to wait in the waiting room if he needed a safe place to stay well he waited for 8 AM it comes he could call the facility which she intends to go to today.  He was discharged with resource guide. Filed Vitals:   10/15/14 0645 10/15/14 0715 10/15/14 0720 10/15/14 0730  BP: 159/91 163/96  140/81  Pulse: 79 90  60  Temp:   97.6 F (36.4 C)   TempSrc:   Oral   Resp:      Height:      Weight:      SpO2: 100% 98%  99%        Delsa Grana, PA-C 10/15/14 0945  Ripley Fraise, MD 10/15/14 2304

## 2014-10-15 NOTE — Progress Notes (Signed)
Dr Darl Householder spoke with ED CM about pt needing a shelter Cm left ED SW a message

## 2014-10-15 NOTE — ED Notes (Signed)
Patient has been seen at [facility] two days in a row with a request for detox. Patient is aware that detox is not provided here. He states that he has a place to go next week for inpatient treatment, but he is here to keep from using until. Patient still wants to be seen although he received the necessary resources this week with a plan for placement next week. Patient uses cocaine marijuana, and alcohol. Last use yesterday. Patient in no distress at this time.

## 2014-10-15 NOTE — Discharge Instructions (Signed)
°Emergency Department Resource Guide °1) Find a Doctor and Pay Out of Pocket °Although you won't have to find out who is covered by your insurance plan, it is a good idea to ask around and get recommendations. You will then need to call the office and see if the doctor you have chosen will accept you as a new patient and what types of options they offer for patients who are self-pay. Some doctors offer discounts or will set up payment plans for their patients who do not have insurance, but you will need to ask so you aren't surprised when you get to your appointment. ° °2) Contact Your Local Health Department °Not all health departments have doctors that can see patients for sick visits, but many do, so it is worth a call to see if yours does. If you don't know where your local health department is, you can check in your phone book. The CDC also has a tool to help you locate your state's health department, and many state websites also have listings of all of their local health departments. ° °3) Find a Walk-in Clinic °If your illness is not likely to be very severe or complicated, you may want to try a walk in clinic. These are popping up all over the country in pharmacies, drugstores, and shopping centers. They're usually staffed by nurse practitioners or physician assistants that have been trained to treat common illnesses and complaints. They're usually fairly quick and inexpensive. However, if you have serious medical issues or chronic medical problems, these are probably not your best option. ° °No Primary Care Doctor: °- Call Health Connect at  832-8000 - they can help you locate a primary care doctor that  accepts your insurance, provides certain services, etc. °- Physician Referral Service- 1-800-533-3463 ° °Chronic Pain Problems: °Organization         Address  Phone   Notes  °[facility]  (336) 297-2271 Patients need to be referred by their primary care doctor.  ° °Medication  Assistance: °Organization         Address  Phone   Notes  °[place] [facility] [address] °[place], [place] [address] (336) 641-8030 --Must be a resident of [place] °-- Must have NO insurance coverage whatsoever (no Medicaid/ Medicare, etc.) °-- The pt. MUST have a primary care doctor that directs their care regularly and follows them in the community °  °[facility]  (866) 331-1348   °[employer]  (888) 892-1162   ° °Agencies that provide inexpensive medical care: °Organization         Address  Phone   Notes  °[facility]  (336) 832-8035   °[facility]    (336) 832-7272   °[facility] [address] °[place], [place] [address] (336) 832-4777   °[facility] [address], °[place] (336) 271-4999   °[facility]    (336) 373-0678   °[facility]    (336) 272-1050   °[facility] ° [address],  Phone:  (336) 832-4444, Fax:  (336) 832-4440 Hours of Operation:  9 am - 6 pm, M-F.  Also accepts Medicaid/Medicare and self-pay.  °[facility] ° [address],  Phone: (336) 832-3150, Fax: (336) 832-3151. Hours of Operation:  8:30 am - 5:30 pm, M-F.  Also accepts Medicaid and self-pay.  °[facility] [place] [address], [place] Phone: (336) 878-6027   °[facility] [address], [place], [place] (336)723-1848, Ext. 123 Mondays & Thursdays: 7-9 AM.  First 15 patients are seen on a first come, first serve basis. °  ° °Medicaid-accepting [place] Providers: ° °Organization         Address  Phone   Notes  °[facility] [address], [place] (336) 641-2100 Also accepts self-pay patients.  °[facility] [address], [place] ° (336) 856-9996   °[facility] [address], [place]  (336) 288-8857   °[facility] [address] [place] [address], [place] (336) 299-7000   °Veita Bland [address], [place]  ° (336) 373-1557 Only accepts [place] Access Medicaid patients after they have their name applied to their card.  ° °Self-Pay (no insurance) in [place]: ° °Organization         Address  Phone   Notes  °Sickle Cell Patients, [facility] [address], [place] (336) 832-1970   °[facility] [address], [place] (336) 832-4400   °[facility] ° [address], [place] (336) 992-4800   °[facility]/Dr. Osei-Bonsu ° [address] [place] [address], [place] or [address], [place] (336) 841-8500 Phone number for both [place] and [place] locations is the same.  °[facility] [address], [place] (336) 299-0000   °[facility] [address] [place] [address], [place] or [address] (336) 852-7530 °(336) 878-2260   °[facility] [address], [place] (336) 350-1642, phone; (336) 294-5005, fax Sees patients 1st and 3rd Saturday of every month.  Must not qualify for public or private insurance (i.e. Medicaid, Medicare, [place] Health Choice, Veterans' Benefits) • Household income should be no more than 200% of the poverty level •The clinic cannot treat you if you are pregnant or think you are pregnant • Sexually transmitted diseases are not treated at the clinic.  ° ° °Dental Care: °Organization         Address  Phone  Notes  °[place] [facility] [address], [place] (336) 641-6152 Accepts children up to age 21 who are enrolled in Medicaid or [place] Health Choice; pregnant women with a Medicaid card; and children who have applied for Medicaid or [place] Health Choice, but were declined, whose parents can pay a reduced fee at time of service.  °[place]  [facility] [place]  [address], [place] (336) 641-7733 Accepts children up to age 21 who are enrolled in Medicaid or [place] Health Choice; pregnant women with a Medicaid card; and children who have applied for Medicaid or [place] Health Choice, but were declined, whose parents can pay a reduced fee at time of service.  °[facility] ° [address], [place] (336) 641-4533 Patients are seen by appointment only. Walk-ins are not accepted. [facility] will see patients 18 years of age and older. °Monday - Tuesday (8am-5pm) °Most Wednesdays (8:30-5pm) °$30 per visit, cash only  °[facility] ° [address], [place] (336) 641-4533 Patients are seen by appointment only. Walk-ins are not accepted. [facility] will see patients 18 years of age and older. °One   Wednesday Evening (Monthly: Volunteer Based).  $30 per visit, cash only  °[facility]  (919) 537-3737 for adults; Children under age 4, call Graduate Pediatric Dentistry at (919) [phone]. Children aged 4-14, please call (919) [phone] to request a pediatric application. ° Dental services are provided in all areas of dental care including fillings, crowns and bridges, complete and partial dentures, implants, gum treatment, root canals, and extractions. Preventive care is also provided. Treatment is provided to both adults and children. °Patients are selected via a lottery and there is often a waiting list. °  °[facility] [address], °[place] ° (336) 763-8833 [email or web address] °  °[facility] [address], [place], [place] (336)723-1848, Ext. 123 Second and Fourth Thursday of each month, opens at 6:30 AM; Clinic ends at 9 AM.  Patients are seen on a first-come first-served basis, and a limited number are seen during each clinic.  ° °[facility] ° [address], [place], [place] (336) 723-7904    Eligibility Requirements °You must have lived in [place], [place], or [place] counties for at least the last three months. °  You cannot be eligible for state or federal sponsored healthcare insurance, including Veterans Administration, Medicaid, or Medicare. °  You generally cannot be eligible for healthcare insurance through your employer.  °  How to apply: °Eligibility screenings are held every Tuesday and Wednesday afternoon from 1:00 pm until 4:00 pm. You do not need an appointment for the interview!  °[facility] [address], [place], [place] 336-631-2330   °[facility]  336-342-8273   °[facility]  336-703-3100   °[facility]  336-570-6415   ° °Behavioral Health Resources in the Community: °Intensive Outpatient Programs °Organization         Address  Phone  Notes  °[facility] [address], [place], [place] 336-878-6098   °[facility] [address], [place], [place] 336-832-9800   °[facility] [address], [place], [place] ° 336-882-2125   °[facility] [address],  °[place], [place] 1-800-853-5163 or 336-641-4981   °Substance Abuse Resources °Organization         Address  Phone  Notes  °[facility]  336-882-2125   °[facility]  336-784-9470   °[facility]  336-285-9073   °[facility]  336-845-3988   °Residential & Outpatient Substance Abuse Program  1-800-659-3381   °Psychological Services °Organization         Address  Phone  Notes  °[facility]  336- 832-9600   °[facility]  336- 378-7881   °[facility] [address], [place] 1-800-853-5163 or 336-641-4981   ° °Mobile Crisis Teams °Organization         Address  Phone  Notes  °[facility], Mobile Crisis Care Unit  1-877-626-1772   °Assertive °[facility] ° [address]  [place], [place] 336-834-9664   °Sharon DeEsch [address] °[place] 336-554-5454   ° °Self-Help/Support Groups °Organization         Address  Phone             Notes  °[facility]  - variety of support groups  336- 373-1402 Call for more information  °Narcotics Anonymous (NA), [facility] [address], °[place]  2 meetings at this location  ° °  Residential Treatment Programs Organization         Address  Phone  Notes  [facility] [address]    [place]  1-(223) 875-7590   [facility]  [address], [place] [id number], [place], [place]   [place], [place] (951)370-2507 Admissions: 8am-3pm M-F  [facility] [place] [address],    [place], [place] [phone]   [facility] [address] [place], [place], [place]   [facility] [address]  [place], [place]   [facility] Intensive Outpatient [address], [place], [place]   [facility] [address]  [place], [place] 1-838-590-7662 or 581-669-1697   [facility] [address], [place], [place] Accepts Medicaid  [facility] [address],  [place] 1-302-697-2349 Substance Abuse/Addiction Treatment   [facility] Organization         Address  Phone  Notes  [facility]  249-383-0151   Domenic Schwab, PhD [address] [place]a [place], [place]   785-215-9904 or 480-764-6229   [facility] [place], [place] 831-522-9185   [facility] [address], [place], [place] [phone] Insurance/Medicaid/sponsorship through [facility] [address] [place]                                    [place], [place] [phone] [facility] [address], [place] [phone]    Dr. Adele Schilder  ([phone]   [facility] Dept. 1) [address] [place] 2) [place] 3)  [address], [place] ([phone])[phone] [phone])[phone]  ([phone]) [phone]   [place] (562)497-6321 or 618-484-8844 (After Hours)        Polysubstance Abuse When people abuse more than one drug or type of drug it is called polysubstance or polydrug abuse. For example, many smokers also drink alcohol. This is one form of polydrug abuse. Polydrug abuse also refers to the use of a drug to counteract an unpleasant effect produced by another drug. It may also be used to help with withdrawal from another drug. People who take stimulants may become agitated. Sometimes this agitation is countered with a tranquilizer. This helps protect against the unpleasant side effects. Polydrug abuse also refers to the use of different drugs at the same time.  Anytime drug use is interfering with normal living activities, it has become abuse. This includes problems with family and friends. Psychological dependence has developed when your mind tells you that the drug is needed. This is usually followed by physical dependence which has developed when continuing increases of drug are required to get the same feeling or "high". This is known as addiction or chemical dependency. A person's risk is much higher if there is a history of chemical dependency in the family. SIGNS OF CHEMICAL DEPENDENCY  You have been told by friends or family that drugs have become a problem.  You fight when using drugs.  You are having blackouts (not remembering what you do while using).  You feel sick from using drugs  but continue using.  You lie about use or amounts of drugs (chemicals) used.  You need chemicals to get you going.  You are suffering in work performance or in  school because of drug use.  You get sick from use of drugs but continue to use anyway.  You need drugs to relate to people or feel comfortable in social situations.  You use drugs to forget problems. "Yes" answered to any of the above signs of chemical dependency indicates there are problems. The longer the use of drugs continues, the greater the problems will become. If there is a family history of drug or alcohol use, it is best not to experiment with these drugs. Continual use leads to tolerance. After tolerance develops more of the drug is needed to get the same feeling. This is followed by addiction. With addiction, drugs become the most important part of life. It becomes more important to take drugs than participate in the other usual activities of life. This includes relating to friends and family. Addiction is followed by dependency. Dependency is a condition where drugs are now needed not just to get high, but to feel normal. Addiction cannot be cured but it can be stopped. This often requires outside help and the care of professionals. Treatment centers are listed in the yellow pages under: Cocaine, Narcotics, and Alcoholics Anonymous. Most hospitals and clinics can refer you to a specialized care center. Talk to your caregiver if you need help. Document Released: 09/02/2004 Document Revised: 04/05/2011 Document Reviewed: 01/11/2005 [facility] Patient Information 2015 [place], [place]. This information is not intended to replace advice given to you by your health care provider. Make sure you discuss any questions you have with your health care provider.

## 2014-10-15 NOTE — ED Notes (Signed)
TTS in process 

## 2014-10-15 NOTE — ED Notes (Signed)
Patient allowed to [place] the phone to call for immediate help from resources provided.
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
## 2014-11-09 DIAGNOSIS — I10 Essential (primary) hypertension: Secondary | ICD-10-CM

## 2014-11-09 NOTE — Congregational Nurse Program (Signed)
B/P check  160/120.  States had run out of meds.  Meds are ready for pick up at the pharmacy.  Client indicates has enough money for co-pay.  Discussed with him the importance of getting blood pressure med today.  Provided bus passes to get to the pharmacy

## 2014-11-09 NOTE — Patient Instructions (Signed)
1.  Pick up medication today from the pharmacy 2.  Return to clinic on 10/13 to re-check B/P 3.  Call PHP for follow-up

## 2015-04-07 ENCOUNTER — Encounter (HOSPITAL_COMMUNITY): Payer: Self-pay | Admitting: Vascular Surgery

## 2015-04-07 ENCOUNTER — Emergency Department (HOSPITAL_COMMUNITY)
Admission: EM | Admit: 2015-04-07 | Discharge: 2015-04-07 | Disposition: A | Discharge status: Home / Self Care | Payer: No Typology Code available for payment source | Attending: Emergency Medicine | Admitting: Emergency Medicine

## 2015-04-07 DIAGNOSIS — F17210 Nicotine dependence, cigarettes, uncomplicated: Secondary | ICD-10-CM | POA: Insufficient documentation

## 2015-04-07 DIAGNOSIS — M5440 Lumbago with sciatica, unspecified side: Secondary | ICD-10-CM | POA: Diagnosis not present

## 2015-04-07 DIAGNOSIS — Y998 Other external cause status: Secondary | ICD-10-CM | POA: Diagnosis not present

## 2015-04-07 DIAGNOSIS — M5442 Lumbago with sciatica, left side: Secondary | ICD-10-CM

## 2015-04-07 DIAGNOSIS — E119 Type 2 diabetes mellitus without complications: Secondary | ICD-10-CM | POA: Insufficient documentation

## 2015-04-07 DIAGNOSIS — I10 Essential (primary) hypertension: Secondary | ICD-10-CM | POA: Insufficient documentation

## 2015-04-07 DIAGNOSIS — Y92410 Unspecified street and highway as the place of occurrence of the external cause: Secondary | ICD-10-CM | POA: Diagnosis not present

## 2015-04-07 DIAGNOSIS — M5441 Lumbago with sciatica, right side: Secondary | ICD-10-CM

## 2015-04-07 DIAGNOSIS — S3992XA Unspecified injury of lower back, initial encounter: Secondary | ICD-10-CM | POA: Diagnosis present

## 2015-04-07 DIAGNOSIS — Y9389 Activity, other specified: Secondary | ICD-10-CM | POA: Diagnosis not present

## 2015-04-07 MED ORDER — IBUPROFEN 400 MG PO TABS
600.0000 mg | ORAL_TABLET | Freq: Once | ORAL | Status: AC
  Administered 2015-04-07: 600 mg via ORAL
  Filled 2015-04-07: qty 1

## 2015-04-07 NOTE — Discharge Instructions (Signed)
Please read and follow all provided instructions.  Your diagnoses today include:  1. Bilateral low back pain with sciatica, sciatica laterality unspecified    Tests performed today include:  Vital signs - see below for your results today  Medications prescribed:   Take any prescribed medications only as directed.  Home care instructions:   Follow any educational materials contained in this packet  Please rest, use ice or heat on your back for the next several days  Do not lift, push, pull anything more than 10 pounds for the next week  Follow-up instructions: Please follow-up with your primary care provider in the next 1 week for further evaluation of your symptoms.   Return instructions:  SEEK IMMEDIATE MEDICAL ATTENTION IF YOU HAVE:  New numbness, tingling, weakness, or problem with the use of your arms or legs  Severe back pain not relieved with medications  Loss control of your bowels or bladder  Increasing pain in any areas of the body (such as chest or abdominal pain)  Shortness of breath, dizziness, or fainting.   Worsening nausea (feeling sick to your stomach), vomiting, fever, or sweats  Any other emergent concerns regarding your health   Additional Information:  Your vital signs today were: BP 173/99 mmHg   Pulse 84   Temp(Src) 98.2 F (36.8 C) (Oral)   Resp 16   SpO2 100% If your blood pressure (BP) was elevated above 135/85 this visit, please have this repeated by your doctor within one month. --------------
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
## 2015-06-02 ENCOUNTER — Encounter (HOSPITAL_COMMUNITY): Payer: Medicare Other

## 2015-10-31 ENCOUNTER — Emergency Department (HOSPITAL_COMMUNITY)
Admission: EM | Admit: 2015-10-31 | Discharge: 2015-10-31 | Disposition: A | Discharge status: Home / Self Care | Payer: Medicare Other | Attending: Emergency Medicine | Admitting: Emergency Medicine

## 2015-10-31 ENCOUNTER — Encounter (HOSPITAL_COMMUNITY): Payer: Self-pay | Admitting: Vascular Surgery

## 2015-10-31 DIAGNOSIS — I739 Peripheral vascular disease, unspecified: Secondary | ICD-10-CM | POA: Insufficient documentation

## 2015-10-31 DIAGNOSIS — E119 Type 2 diabetes mellitus without complications: Secondary | ICD-10-CM | POA: Diagnosis not present

## 2015-10-31 DIAGNOSIS — M79662 Pain in left lower leg: Secondary | ICD-10-CM | POA: Diagnosis present

## 2015-10-31 DIAGNOSIS — F17210 Nicotine dependence, cigarettes, uncomplicated: Secondary | ICD-10-CM | POA: Insufficient documentation

## 2015-10-31 DIAGNOSIS — I10 Essential (primary) hypertension: Secondary | ICD-10-CM | POA: Insufficient documentation

## 2015-10-31 HISTORY — DX: Essential (primary) hypertension: I10

## 2015-10-31 LAB — CBC WITH DIFFERENTIAL/PLATELET
Basophils Absolute: 0 10*3/uL (ref 0.0–0.1)
Basophils Relative: 0 %
Eosinophils Absolute: 0.4 10*3/uL (ref 0.0–0.7)
Eosinophils Relative: 6 %
HCT: 44.7 % (ref 39.0–52.0)
Hemoglobin: 14.1 g/dL (ref 13.0–17.0)
Lymphocytes Relative: 45 %
Lymphs Abs: 2.8 10*3/uL (ref 0.7–4.0)
MCH: 28.1 pg (ref 26.0–34.0)
MCHC: 31.5 g/dL (ref 30.0–36.0)
MCV: 89.2 fL (ref 78.0–100.0)
MONOS PCT: 13 %
Monocytes Absolute: 0.8 10*3/uL (ref 0.1–1.0)
NEUTROS ABS: 2.2 10*3/uL (ref 1.7–7.7)
NEUTROS PCT: 36 %
PLATELETS: 243 10*3/uL (ref 150–400)
RBC: 5.01 MIL/uL (ref 4.22–5.81)
RDW: 16.1 % — ABNORMAL HIGH (ref 11.5–15.5)
WBC: 6.2 10*3/uL (ref 4.0–10.5)

## 2015-10-31 LAB — BASIC METABOLIC PANEL
ANION GAP: 8 (ref 5–15)
BUN: 10 mg/dL (ref 6–20)
CALCIUM: 9.7 mg/dL (ref 8.9–10.3)
CO2: 27 mmol/L (ref 22–32)
CREATININE: 1.11 mg/dL (ref 0.61–1.24)
Chloride: 108 mmol/L (ref 101–111)
Glucose, Bld: 88 mg/dL (ref 65–99)
Potassium: 4.1 mmol/L (ref 3.5–5.1)
SODIUM: 143 mmol/L (ref 135–145)

## 2015-10-31 LAB — MAGNESIUM: Magnesium: 2.5 mg/dL — ABNORMAL HIGH (ref 1.7–2.4)

## 2015-10-31 NOTE — ED Notes (Signed)
Pt eating chips, prior to vitals being reassessed. Pt asked for water. Pt given 1/2c water to wash down chips, per Joellen Jersey - RN.

## 2015-10-31 NOTE — ED Triage Notes (Signed)
Pt reports to the ED for eval of bilateral leg pain. Pt reports he can only walk for 2 mins before he has to sit down because his legs hurt so bad. Pt reports he has had this pain x 3-4 years but it has been getting worse in the past 1 month so he came here.

## 2015-10-31 NOTE — ED Provider Notes (Signed)
[facility] DEPT Provider Note   CSN: [id number] Arrival date & time: 10/31/15  1302     History   Chief Complaint Chief Complaint  Patient presents with  . Leg Pain    HPI [name] is a 55 y.o. male.  HPI Patient ports is sent to the hospital by his nurse. He reports that she checked his pulses in the feet and they were normal. The patient reports for several years he's been having problems with getting severe cramping and pain in his calves and feet when he walks. Ports he can walk about 5 minutes and has to sit down for about 5 minutes. He reports the cramping seems to gotten worse over time. He denies associated symptoms. He has not noted any swelling. No redness. The pain resolves when he rests. No associated chest pain or dyspnea. Past Medical History:  Diagnosis Date  . Diabetes mellitus without complication ([place])   . Hypertension     Patient Active Problem List   Diagnosis Date Noted  . ESSENTIAL HYPERTENSION, BENIGN 10/23/2009    Past Surgical History:  Procedure Laterality Date  . arm surgery         Home Medications    Prior to Admission medications   Not on File    Family History History reviewed. No pertinent family history.  Social History Social History  Substance Use Topics  . Smoking status: Current Every Day Smoker    Packs/day: 0.50    Types: Cigarettes  . Smokeless tobacco: Never Used  . Alcohol use Yes     Comment: beer and liquor daily     Allergies   Review of patient's allergies indicates no known allergies.   Review of Systems Review of Systems 10 Systems reviewed and are negative for acute change except as noted in the HPI.   Physical Exam Updated Vital Signs BP 156/97 (BP Location: Right Arm)   Pulse 73   Temp 98 F (36.7 C) (Oral)   Resp 22   SpO2 100%   Physical Exam  Constitutional: He is oriented to person, place, and time.  Patient is thin but well nourished appearing. He is nontoxic and alert.  No respiratory distress.  HENT:  Head: Normocephalic and atraumatic.  Eyes: EOM are normal.  Cardiovascular: Normal rate, regular rhythm and normal heart sounds.   Pulmonary/Chest: Effort normal and breath sounds normal.  Abdominal: Soft. He exhibits no distension. There is no tenderness.  Musculoskeletal: He exhibits no edema, tenderness or deformity.  Patient has thickened toenails and some dry skin of the feet. Generally however skin is intact good condition. There are no ulcers or erythema. There is skin thinning and hair loss. No peripheral edema. Muscle bodies are well formed in the calf and the thigh. Soft Tissues are nontender to compression. Feet are warm and dry with brisk cap refill. Pulses are not palpable manually. Doppler however, both dorsalis pedis and posterior tibial pulses are present in both feet.  Neurological: He is alert and oriented to person, place, and time. He exhibits normal muscle tone. Coordination normal.  Skin: Skin is warm and dry.  Psychiatric: He has a normal mood and affect.     ED Treatments / Results  Labs (all labs ordered are listed, but only abnormal results are displayed) Labs Reviewed  CBC WITH DIFFERENTIAL/PLATELET - Abnormal; Notable for the following:       Result Value   RDW 16.1 (*)    All other components within normal limits  MAGNESIUM - Abnormal; Notable for the following:    Magnesium 2.5 (*)    All other components within normal limits  BASIC METABOLIC PANEL    EKG  EKG Interpretation None       Radiology No results found.  Procedures Procedures (including critical care time)  Medications Ordered in ED Medications - No data to display   Initial Impression / Assessment and Plan / ED Course  I have reviewed the triage vital signs and the nursing notes.  Pertinent labs & imaging results that were available during my care of the patient were reviewed by me and considered in my medical decision making (see chart for  details).  Clinical Course     Final Clinical Impressions(s) / ED Diagnoses   Final diagnoses:  Claudication [facility])  History is consistent with claudications. Patient has equal pulses by Doppler and no signs of acute occlusion. Also on the follow-up plan with his primary care physician and further outpatient testing and vascular referral.  New Prescriptions New Prescriptions   No medications on file     Charlesetta Shanks, MD 10/31/15 2205

## 2016-08-24 ENCOUNTER — Other Ambulatory Visit: Payer: Self-pay | Admitting: Surgery

## 2016-08-24 DIAGNOSIS — H47292 Other optic atrophy, left eye: Secondary | ICD-10-CM

## 2016-09-06 ENCOUNTER — Ambulatory Visit
Admission: RE | Admit: 2016-09-06 | Discharge: 2016-09-06 | Disposition: A | Discharge status: Home / Self Care | Payer: Medicare Other | Source: Ambulatory Visit | Attending: Surgery | Admitting: Surgery

## 2016-09-06 DIAGNOSIS — H47292 Other optic atrophy, left eye: Secondary | ICD-10-CM

## 2016-09-06 MED ORDER — GADOBENATE DIMEGLUMINE 529 MG/ML IV SOLN: 10.0000 mL | Freq: Once | INTRAVENOUS | Status: DC | PRN

## 2017-04-01 ENCOUNTER — Other Ambulatory Visit (HOSPITAL_COMMUNITY): Payer: Self-pay | Admitting: Primary Care

## 2017-04-01 DIAGNOSIS — M25511 Pain in right shoulder: Secondary | ICD-10-CM

## 2017-04-04 ENCOUNTER — Emergency Department (HOSPITAL_COMMUNITY)
Admission: EM | Admit: 2017-04-04 | Discharge: 2017-04-04 | Disposition: A | Discharge status: Home / Self Care | Payer: Medicare Other | Attending: Emergency Medicine | Admitting: Emergency Medicine

## 2017-04-04 ENCOUNTER — Emergency Department (HOSPITAL_COMMUNITY): Payer: Medicare Other

## 2017-04-04 ENCOUNTER — Encounter (HOSPITAL_COMMUNITY): Payer: Self-pay

## 2017-04-04 ENCOUNTER — Other Ambulatory Visit: Payer: Self-pay

## 2017-04-04 DIAGNOSIS — F17210 Nicotine dependence, cigarettes, uncomplicated: Secondary | ICD-10-CM | POA: Diagnosis not present

## 2017-04-04 DIAGNOSIS — M25511 Pain in right shoulder: Secondary | ICD-10-CM | POA: Diagnosis present

## 2017-04-04 DIAGNOSIS — I10 Essential (primary) hypertension: Secondary | ICD-10-CM | POA: Diagnosis not present

## 2017-04-04 DIAGNOSIS — E119 Type 2 diabetes mellitus without complications: Secondary | ICD-10-CM | POA: Insufficient documentation

## 2017-04-04 MED ORDER — KETOROLAC TROMETHAMINE 30 MG/ML IJ SOLN
15.0000 mg | Freq: Once | INTRAMUSCULAR | Status: AC
  Administered 2017-04-04: 15 mg via INTRAMUSCULAR
  Filled 2017-04-04: qty 1

## 2017-04-04 NOTE — Discharge Instructions (Signed)
As discussed, your evaluation today has been largely reassuring.  But, it is important that you monitor your condition carefully, and do not hesitate to return to the ED if you develop new, or concerning changes in your condition. ? ?Otherwise, please follow-up with your physician for appropriate ongoing care. ? ?

## 2017-04-04 NOTE — ED Provider Notes (Signed)
[facility] DEPT Provider Note   CSN: [id number] Arrival date & time: 04/04/17  6295     History   Chief Complaint Chief Complaint  Patient presents with  . Shoulder Pain    right    HPI [name] is a 57 y.o. male.  HPI Presents with concern of right shoulder pain. Pain began about 1 week ago, since onset is been persistent, worsening, worse with any attempted motion. It is unclear if patient has taken any medication for pain relief. Patient saw his physician on Friday, was referred here for x-ray, evaluation, but has waited 3 days to be seen. He has a notable history of prior surgical intervention in the shoulder, with placement of a rod, but he is unsure of when or why that was done. He states that he is otherwise well, though he admits to cigarette smoking.  We discussed the importance of smoking cessation, the patient seems amenable to this.  Past Medical History:  Diagnosis Date  . Diabetes mellitus without complication ([place])   . Hypertension     Patient Active Problem List   Diagnosis Date Noted  . ESSENTIAL HYPERTENSION, BENIGN 10/23/2009    Past Surgical History:  Procedure Laterality Date  . arm surgery         Home Medications    Prior to Admission medications   Not on File    Family History History reviewed. No pertinent family history.  Social History Social History   Tobacco Use  . Smoking status: Current Every Day Smoker    Packs/day: 0.50    Types: Cigarettes  . Smokeless tobacco: Never Used  Substance Use Topics  . Alcohol use: Yes    Comment: beer and liquor daily  . Drug use: Yes    Frequency: 3.0 times per week    Types: Cocaine, Marijuana     Allergies   Patient has no known allergies.   Review of Systems Review of Systems  Constitutional:       Per HPI, otherwise negative  HENT:       Per HPI, otherwise negative  Respiratory:       Per HPI, otherwise negative    Cardiovascular:       Per HPI, otherwise negative  Gastrointestinal: Negative for vomiting.  Endocrine:       Negative aside from HPI  Genitourinary:       Neg aside from HPI   Musculoskeletal:       Per HPI, otherwise negative  Skin: Negative.   Neurological: Negative for syncope.     Physical Exam Updated Vital Signs Wt 54.4 kg (120 lb)   BMI 21.95 kg/m   Physical Exam  Constitutional: He is oriented to person, place, and time. He appears well-developed. No distress.  HENT:  Head: Normocephalic and atraumatic.  Eyes: Conjunctivae and EOM are normal.  Cardiovascular: Normal rate and regular rhythm.  Pulmonary/Chest: Effort normal. No stridor. No respiratory distress.  Abdominal: He exhibits no distension.  Musculoskeletal: He exhibits no edema.       Right shoulder: He exhibits decreased range of motion, tenderness and bony tenderness. He exhibits no swelling, no effusion and no crepitus.       Left shoulder: Normal.       Right elbow: Normal. Neurological: He is alert and oriented to person, place, and time.  Skin: Skin is warm and dry.  Psychiatric: He has a normal mood and affect.  Nursing note and vitals reviewed.  ED Treatments / Results   Radiology Dg Shoulder Right  Result Date: 04/04/2017 CLINICAL DATA:  Shoulder pain worsening over the last week. Previous rotator cuff repair. EXAM: RIGHT SHOULDER - 2+ VIEW COMPARISON:  Radiography 12/24/2003.  MRI 02/27/2004. FINDINGS: Chronic degenerative change of the glenohumeral joint. Previous acromioplasty. Previous cuff repair with anchors. Irregular contour of the lucency of the greater tuberosity of the humerus with underlying lucency. Given these two views, I cannot rule out a recent avulsion. Findings could be chronic however. IMPRESSION: Question avulsion fracture of the greater tuberosity of the humerus in this patient with previous cuff repair. This is not definite however. Chronic glenohumeral joint  osteoarthritis. Previous acromioplasty. Electronically Signed   By: Nelson Chimes M.D.   On: 04/04/2017 10:30    Procedures Procedures (including critical care time)  Medications Ordered in ED Medications  ketorolac (TORADOL) 30 MG/ML injection 15 mg (15 mg Intramuscular Given 04/04/17 1013)     Initial Impression / Assessment and Plan / ED Course  I have reviewed the triage vital signs and the nursing notes.  Pertinent labs & imaging results that were available during my care of the patient were reviewed by me and considered in my medical decision making (see chart for details).     10:44 AM Patient awake and alert, in no distress. We discussed findings of possible avulsion fracture.  Patient has had a splint applied by our orthopedic technician. This was well tolerated, without complication. We discussed the importance of following up with his orthopedist, the patient was amenable to this. Absent other notable findings, patient discharged in stable condition.  Final Clinical Impressions(s) / ED Diagnoses   Final diagnoses:  Acute pain of right shoulder     Carmin Muskrat, MD 04/04/17 1610

## 2017-04-04 NOTE — ED Notes (Signed)
Bed: AF79 Expected date:  Expected time:  Means of arrival:  Comments: 57 yo m arm pain

## 2017-04-04 NOTE — ED Triage Notes (Signed)
Patient BIB EMS from home with complaints of right shoulder pain. Patient was seen by his doctor on Friday and was told to get an xray of his right shoulder due to a history of hardware in that shoulder. Patient denies injury/falls. Patient states he thinks its "from the way I sleep." Patient ambulatory in triage. AxOx4. VSS.

## 2018-10-13 ENCOUNTER — Ambulatory Visit (INDEPENDENT_AMBULATORY_CARE_PROVIDER_SITE_OTHER): Payer: Medicare Other | Admitting: Family Medicine

## 2018-10-13 ENCOUNTER — Encounter: Payer: Self-pay | Admitting: Family Medicine

## 2018-10-13 ENCOUNTER — Other Ambulatory Visit: Payer: Self-pay

## 2018-10-13 VITALS — BP 94/61 | HR 80 | Temp 97.4°F | Ht 62.0 in | Wt 108.0 lb

## 2018-10-13 DIAGNOSIS — E119 Type 2 diabetes mellitus without complications: Secondary | ICD-10-CM

## 2018-10-13 DIAGNOSIS — Z114 Encounter for screening for human immunodeficiency virus [HIV]: Secondary | ICD-10-CM

## 2018-10-13 DIAGNOSIS — E7800 Pure hypercholesterolemia, unspecified: Secondary | ICD-10-CM

## 2018-10-13 DIAGNOSIS — I10 Essential (primary) hypertension: Secondary | ICD-10-CM | POA: Diagnosis not present

## 2018-10-13 DIAGNOSIS — Z1211 Encounter for screening for malignant neoplasm of colon: Secondary | ICD-10-CM

## 2018-10-13 DIAGNOSIS — Z1159 Encounter for screening for other viral diseases: Secondary | ICD-10-CM

## 2018-10-13 DIAGNOSIS — Z125 Encounter for screening for malignant neoplasm of prostate: Secondary | ICD-10-CM

## 2018-10-13 DIAGNOSIS — Z23 Encounter for immunization: Secondary | ICD-10-CM

## 2018-10-13 DIAGNOSIS — F17200 Nicotine dependence, unspecified, uncomplicated: Secondary | ICD-10-CM | POA: Diagnosis not present

## 2018-10-13 MED ORDER — CHANTIX STARTING MONTH PAK 0.5 MG X 11 & 1 MG X 42 PO TABS: ORAL_TABLET | ORAL | 0 refills | Status: DC

## 2018-10-13 NOTE — Progress Notes (Signed)
 9/18/20209:33 AM  [name] 04/26/1960, 58 y.o., male [id number]  Chief Complaint  Patient presents with  . New Patient (Initial Visit)    taking htn meds, soes not need refills at this time    HPI:   Patient is a 58 y.o. male with past medical history significant for HTN, HLP, DM2, alcohol use who presents today to establish care  Previous PCP Dr Michelle [facility] Last OV was about a year ago  Has not taken BP medication yet Has never taken medication for DM2, diagnosed within the past 2-3 years  Has cut back on drinking Drinks about a 6 pack on the weekends Has never been told he has liver damage Uses THC occasionally Smokes 1/2 ppd x 15 years Denies h/o COPD Interested in quitting Tried patches -did not work  He reports his weight has been stable He rates his health at 8/10  Saw eye doctor yesterday, told normal exam other than he needs eyeglasses Does not remember name of eye doctor  Denies fhx colon cancer Denies fhx prostate cancer Nocturia x 2  Reports bilateral foot pain, recently saw podiatry Prescribed medication - does not know name, has not started yet  Depression screen PHQ 2/9 10/13/2018  Decreased Interest 0  Down, Depressed, Hopeless 0  PHQ - 2 Score 0    Fall Risk  10/13/2018  Falls in the past year? 0  Number falls in past yr: 0  Injury with Fall? 0     No Known Allergies  Prior to Admission medications   Medication Sig Start Date End Date Taking? Authorizing Provider  atorvastatin (LIPITOR) 20 MG tablet Take 20 mg by mouth daily.   Yes [provider]  lisinopril-hydrochlorothiazide (ZESTORETIC) 20-12.5 MG tablet Take 1 tablet by mouth daily.   Yes [provider]    Past Medical History:  Diagnosis Date  . Diabetes mellitus without complication (H[facility])   . Hypertension     Past Surgical History:  Procedure Laterality Date  . arm surgery      Social History   Tobacco Use  .  Smoking status: Current Every Day Smoker    Packs/day: 0.50    Types: Cigarettes  . Smokeless tobacco: Never Used  Substance Use Topics  . Alcohol use: Yes    Comment: beer and liquor daily    No family history on file.  Review of Systems  Constitutional: Negative for chills, diaphoresis, fever and malaise/fatigue.  HENT: Negative for hearing loss.   Eyes: Positive for blurred vision.  Respiratory: Negative for cough and shortness of breath.   Cardiovascular: Negative for chest pain, palpitations and leg swelling.  Gastrointestinal: Negative for abdominal pain, blood in stool, constipation, diarrhea, melena, nausea and vomiting.  Genitourinary: Negative for dysuria and hematuria.  Musculoskeletal: Positive for joint pain. Negative for falls and myalgias.  Neurological: Negative for dizziness, tingling, focal weakness and headaches.  Endo/Heme/Allergies: Negative for polydipsia.  Psychiatric/Behavioral: Negative for depression. The patient is not nervous/anxious and does not have insomnia.   All other systems reviewed and are negative.  Per hpi  OBJECTIVE:  Today's Vitals   10/13/18 0928  BP: 94/61  Pulse: 80  Temp: (!) 97.4 F (36.3 C)  SpO2: 99%  Weight: 108 lb (49 kg)  Height: 5' 2" (1.575 m)   Body mass index is 19.75 kg/m.   Physical Exam Vitals signs and nursing note reviewed.  Constitutional:      Appearance: He is well-developed.  HENT:       Head: Normocephalic and atraumatic.     Right Ear: Hearing, tympanic membrane, ear canal and external ear normal.     Left Ear: Hearing, tympanic membrane, ear canal and external ear normal.     Mouth/Throat:     Pharynx: No oropharyngeal exudate.  Eyes:     Conjunctiva/sclera: Conjunctivae normal.     Pupils: Pupils are equal, round, and reactive to light.  Neck:     Musculoskeletal: Neck supple.     Thyroid: No thyromegaly.  Cardiovascular:     Rate and Rhythm: Normal rate and regular rhythm.     Heart sounds:  Normal heart sounds. No murmur. No friction rub. No gallop.   Pulmonary:     Effort: Pulmonary effort is normal.     Breath sounds: Normal breath sounds. No wheezing, rhonchi or rales.  Abdominal:     General: Bowel sounds are normal. There is no distension.     Palpations: Abdomen is soft. There is no mass.     Tenderness: There is no abdominal tenderness.  Musculoskeletal: Normal range of motion.     Right lower leg: No edema.     Left lower leg: No edema.  Lymphadenopathy:     Cervical: No cervical adenopathy.  Skin:    General: Skin is warm and dry.  Neurological:     Mental Status: He is alert and oriented to person, place, and time.     Cranial Nerves: No cranial nerve deficit.     Coordination: Coordination normal.     Gait: Gait normal.     Deep Tendon Reflexes: Reflexes are normal and symmetric.     No results found for this or any previous visit (from the past 24 hour(s)).  No results found.   ASSESSMENT and PLAN  1. Essential hypertension, benign Patient hypotensive wo meds. Asymptomatic. Hold meds until next visit to re-eval. - CMP14+EGFR  2. Pure hypercholesterolemia Checking labs today, medications will be adjusted as needed.  - Lipid panel  3. Type 2 diabetes mellitus without complication, without long-term current use of insulin (HCC) Not on meds. Control status unknown. Labs pending. Has had recent eye exam.  - TSH - Hemoglobin A1c - Microalbumin/Creatinine Ratio, Urine  4. Tobacco use disorder Smoking cessation instruction/counseling given 3-10 minutes:  counseled patient on the dangers of tobacco use, advised patient to stop smoking, and reviewed strategies to maximize success  Trial of chantix. Reviewed r/se/b  5. Colon cancer screening - Cologuard  6. Prostate cancer screening - PSA  7. Screening for HIV (human immunodeficiency virus) - HIV Antibody (routine testing w rflx)  8. Encounter for hepatitis C screening test for low risk patient  - Hepatitis C antibody  Other orders - Flu Vaccine QUAD 36+ mos IM - atorvastatin (LIPITOR) 20 MG tablet; Take 20 mg by mouth daily. - varenicline (CHANTIX STARTING MONTH PAK) 0.5 MG X 11 & 1 MG X 42 tablet; Take one 0.5 mg tablet by mouth once daily for 3 days, then increase to one 0.5 mg tablet twice daily for 4 days, then increase to one 1 mg tablet twice daily.  Return in about 1 week (around 10/20/2018).    Irma M Santiago, MD [facility] [address] [place], [place] [address] Ph.  [phone] Fax [phone]   

## 2018-10-13 NOTE — Patient Instructions (Addendum)
Hold off on BP medication until next appt in 1 week Please bring me name of eye doctor to next appt    Coping with Quitting Smoking  Quitting smoking is a physical and mental challenge. You will face cravings, withdrawal symptoms, and temptation. Before quitting, work with your health care provider to make a plan that can help you cope. Preparation can help you quit and keep you from giving in. How can I cope with cravings? Cravings usually last for 5-10 minutes. If you get through it, the craving will pass. Consider taking the following actions to help you cope with cravings:  Keep your mouth busy: ? Chew sugar-free gum. ? Suck on hard candies or a straw. ? Brush your teeth.  Keep your hands and body busy: ? Immediately change to a different activity when you feel a craving. ? Squeeze or play with a ball. ? Do an activity or a hobby, like making bead jewelry, practicing needlepoint, or working with wood. ? Mix up your normal routine. ? Take a short exercise break. Go for a quick walk or run up and down stairs. ? Spend time in public places where smoking is not allowed.  Focus on doing something kind or helpful for someone else.  Call a friend or family member to talk during a craving.  Join a support group.  Call a quit line, such as 1-800-QUIT-NOW.  Talk with your health care provider about medicines that might help you cope with cravings and make quitting easier for you. How can I deal with withdrawal symptoms? Your body may experience negative effects as it tries to get used to not having nicotine in the system. These effects are called withdrawal symptoms. They may include:  Feeling hungrier than normal.  Trouble concentrating.  Irritability.  Trouble sleeping.  Feeling depressed.  Restlessness and agitation.  Craving a cigarette. To manage withdrawal symptoms:  Avoid places, people, and activities that trigger your cravings.  Remember why you want to  quit.  Get plenty of sleep.  Avoid coffee and other caffeinated drinks. These may worsen some of your symptoms. How can I handle social situations? Social situations can be difficult when you are quitting smoking, especially in the first few weeks. To manage this, you can:  Avoid parties, bars, and other social situations where people might be smoking.  Avoid alcohol.  Leave right away if you have the urge to smoke.  Explain to your family and friends that you are quitting smoking. Ask for understanding and support.  Plan activities with friends or family where smoking is not an option. What are some ways I can cope with stress? Wanting to smoke may cause stress, and stress can make you want to smoke. Find ways to manage your stress. Relaxation techniques can help. For example:  Breathe slowly and deeply, in through your nose and out through your mouth.  Listen to soothing, relaxing music.  Talk with a family member or friend about your stress.  Light a candle.  Soak in a bath or take a shower.  Think about a peaceful place. What are some ways I can prevent weight gain? Be aware that many people gain weight after they quit smoking. However, not everyone does. To keep from gaining weight, have a plan in place before you quit and stick to the plan after you quit. Your plan should include:  Having healthy snacks. When you have a craving, it may help to: ? Eat plain popcorn, crunchy carrots, celery, or  other cut vegetables. ? Chew sugar-free gum.  Changing how you eat: ? Eat small portion sizes at meals. ? Eat 4-6 small meals throughout the day instead of 1-2 large meals a day. ? Be mindful when you eat. Do not watch television or do other things that might distract you as you eat.  Exercising regularly: ? Make time to exercise each day. If you do not have time for a long workout, do short bouts of exercise for 5-10 minutes several times a day. ? Do some form of strengthening  exercise, like weight lifting, and some form of aerobic exercise, like running or swimming.  Drinking plenty of water or other low-calorie or no-calorie drinks. Drink 6-8 glasses of water daily, or as much as instructed by your health care provider. Summary  Quitting smoking is a physical and mental challenge. You will face cravings, withdrawal symptoms, and temptation to smoke again. Preparation can help you as you go through these challenges.  You can cope with cravings by keeping your mouth busy (such as by chewing gum), keeping your body and hands busy, and making calls to family, friends, or a helpline for people who want to quit smoking.  You can cope with withdrawal symptoms by avoiding places where people smoke, avoiding drinks with caffeine, and getting plenty of rest.  Ask your health care provider about the different ways to prevent weight gain, avoid stress, and handle social situations. This information is not intended to replace advice given to you by your health care provider. Make sure you discuss any questions you have with your health care provider. Document Released: 01/09/2016 Document Revised: 12/24/2016 Document Reviewed: 01/09/2016 Elsevier Patient Education  [employer].  If you have lab work done today you will be contacted with your lab results within the next 2 weeks.  If you have not heard from [place] then please contact us. The fastest way to get your results is to register for My Chart.   IF you received an x-ray today, you will receive an invoice from [facility]. Please contact [facility] at [phone] with questions or concerns regarding your invoice.   IF you received labwork today, you will receive an invoice from [place]. Please contact [facility] at [phone] with questions or concerns regarding your invoice.   Our billing staff will not be able to assist you with questions regarding bills from these companies.  You will be  contacted with the lab results as soon as they are available. The fastest way to get your results is to activate your My Chart account. Instructions are located on the last page of this paperwork. If you have not heard from [place] regarding the results in 2 weeks, please contact this office.

## 2018-10-14 LAB — LIPID PANEL
Chol/HDL Ratio: 3.6 ratio (ref 0.0–5.0)
Cholesterol, Total: 154 mg/dL (ref 100–199)
HDL: 43 mg/dL (ref >39)
LDL Chol Calc (NIH): 89 mg/dL (ref 0–99)
Triglycerides: 121 mg/dL (ref 0–149)
VLDL Cholesterol Cal: 22 mg/dL (ref 5–40)

## 2018-10-14 LAB — HEPATITIS C ANTIBODY: Hep C Virus Ab: <0.1 s/co ratio (ref 0.0–0.9)

## 2018-10-14 LAB — CMP14+EGFR
ALT: 23 IU/L (ref 0–44)
AST: 13 IU/L (ref 0–40)
Albumin/Globulin Ratio: 1.7 (ref 1.2–2.2)
Albumin: 4.3 g/dL (ref 3.8–4.9)
Alkaline Phosphatase: 98 IU/L (ref 39–117)
BUN/Creatinine Ratio: 17 (ref 9–20)
BUN: 26 mg/dL — ABNORMAL HIGH (ref 6–24)
Bilirubin Total: <0.2 mg/dL (ref 0.0–1.2)
CO2: 24 mmol/L (ref 20–29)
Calcium: 9.3 mg/dL (ref 8.7–10.2)
Chloride: 111 mmol/L — ABNORMAL HIGH (ref 96–106)
Creatinine, Ser: 1.55 mg/dL — ABNORMAL HIGH (ref 0.76–1.27)
GFR calc Af Amer: 56 mL/min/{1.73_m2} — ABNORMAL LOW (ref >59)
GFR calc non Af Amer: 48 mL/min/{1.73_m2} — ABNORMAL LOW (ref >59)
Globulin, Total: 2.6 g/dL (ref 1.5–4.5)
Glucose: 121 mg/dL — ABNORMAL HIGH (ref 65–99)
Potassium: 3.9 mmol/L (ref 3.5–5.2)
Sodium: 147 mmol/L — ABNORMAL HIGH (ref 134–144)
Total Protein: 6.9 g/dL (ref 6.0–8.5)

## 2018-10-14 LAB — PSA: Prostate Specific Ag, Serum: 1.2 ng/mL (ref 0.0–4.0)

## 2018-10-14 LAB — HEMOGLOBIN A1C
Est. average glucose Bld gHb Est-mCnc: 134 mg/dL
Hgb A1c MFr Bld: 6.3 % — ABNORMAL HIGH (ref 4.8–5.6)

## 2018-10-14 LAB — HIV ANTIBODY (ROUTINE TESTING W REFLEX): HIV Screen 4th Generation wRfx: NONREACTIVE

## 2018-10-14 LAB — TSH: TSH: 0.59 u[IU]/mL (ref 0.450–4.500)

## 2018-10-20 ENCOUNTER — Ambulatory Visit (INDEPENDENT_AMBULATORY_CARE_PROVIDER_SITE_OTHER): Payer: Medicare Other | Admitting: Family Medicine

## 2018-10-20 ENCOUNTER — Other Ambulatory Visit: Payer: Self-pay

## 2018-10-20 ENCOUNTER — Encounter: Payer: Self-pay | Admitting: Family Medicine

## 2018-10-20 VITALS — BP 124/80 | HR 77 | Temp 98.4°F | Wt 109.0 lb

## 2018-10-20 DIAGNOSIS — E119 Type 2 diabetes mellitus without complications: Secondary | ICD-10-CM

## 2018-10-20 DIAGNOSIS — E7800 Pure hypercholesterolemia, unspecified: Secondary | ICD-10-CM

## 2018-10-20 DIAGNOSIS — N289 Disorder of kidney and ureter, unspecified: Secondary | ICD-10-CM

## 2018-10-20 DIAGNOSIS — I10 Essential (primary) hypertension: Secondary | ICD-10-CM | POA: Diagnosis not present

## 2018-10-20 MED ORDER — BLOOD PRESSURE MONITOR KIT: PACK | 0 refills | Status: AC

## 2018-10-20 NOTE — Patient Instructions (Addendum)
    1. Nurse visit in 2 weeks for BP check (now off meds) and BMP lab work  If you have lab work done today you will be contacted with your lab results within the next 2 weeks.  If you have not heard from [place] then please contact us. The fastest way to get your results is to register for My Chart.   IF you received an x-ray today, you will receive an invoice from [facility]. Please contact [facility] at [phone] with questions or concerns regarding your invoice.   IF you received labwork today, you will receive an invoice from [place]. Please contact [facility] at [phone] with questions or concerns regarding your invoice.   Our billing staff will not be able to assist you with questions regarding bills from these companies.  You will be contacted with the lab results as soon as they are available. The fastest way to get your results is to activate your My Chart account. Instructions are located on the last page of this paperwork. If you have not heard from [place] regarding the results in 2 weeks, please contact this office.

## 2018-10-20 NOTE — Progress Notes (Signed)
9/25/20204:46 PM  [name] 1960-05-12, 58 y.o., male [id number]  Chief Complaint  Patient presents with  . Follow-up    1 wk follow up for bp, did give names of prior pcp and eye doctor. Faxing consents today. was able to give urine today    HPI:   Patient is a 58 y.o. male with past medical history significant for HTN, HLP, DM2, alcohol use  who presents today for followup  Last oV a week ago Stopped BP meds Here to review labs  Lab Results  Component Value Date   HGBA1C 6.3 (H) 10/13/2018   Lab Results  Component Value Date   WBC 6.2 10/31/2015   HGB 14.1 10/31/2015   HCT 44.7 10/31/2015   MCV 89.2 10/31/2015   PLT 243 10/31/2015   Lab Results  Component Value Date   CREATININE 1.55 (H) 10/13/2018   BUN 26 (H) 10/13/2018   NA 147 (H) 10/13/2018   K 3.9 10/13/2018   CL 111 (H) 10/13/2018   CO2 24 10/13/2018   Lab Results  Component Value Date   ALT 23 10/13/2018   AST 13 10/13/2018   ALKPHOS 98 10/13/2018   BILITOT <0.2 10/13/2018   Lab Results  Component Value Date   CHOL 154 10/13/2018   HDL 43 10/13/2018   TRIG 121 10/13/2018   CHOLHDL 3.6 10/13/2018     Depression screen PHQ 2/9 10/20/2018 10/13/2018  Decreased Interest 0 0  Down, Depressed, Hopeless 0 0  PHQ - 2 Score 0 0    Fall Risk  10/20/2018 10/13/2018  Falls in the past year? 0 0  Number falls in past yr: 0 0  Injury with Fall? 0 0     No Known Allergies  Prior to Admission medications   Medication Sig Start Date End Date Taking? Authorizing Provider  atorvastatin (LIPITOR) 20 MG tablet Take 20 mg by mouth daily.   Yes [provider]  varenicline (CHANTIX STARTING MONTH PAK) 0.5 MG X 11 & 1 MG X 42 tablet Take one 0.5 mg tablet by mouth once daily for 3 days, then increase to one 0.5 mg tablet twice daily for 4 days, then increase to one 1 mg tablet twice daily. 10/13/18  Yes Rutherford Guys, MD  lisinopril-hydrochlorothiazide (ZESTORETIC) 20-12.5 MG tablet Take 1  tablet by mouth daily.    [provider]    Past Medical History:  Diagnosis Date  . Diabetes mellitus without complication ([place])   . Hyperlipidemia   . Hypertension   . Tobacco use     Past Surgical History:  Procedure Laterality Date  . arm surgery      Social History   Tobacco Use  . Smoking status: Current Every Day Smoker    Packs/day: 0.50    Types: Cigarettes  . Smokeless tobacco: Never Used  Substance Use Topics  . Alcohol use: Yes    Comment: beer and liquor daily    No family history on file.  Review of Systems  Constitutional: Negative for chills and fever.  Respiratory: Negative for cough and shortness of breath.   Cardiovascular: Negative for chest pain, palpitations and leg swelling.  Gastrointestinal: Negative for abdominal pain, nausea and vomiting.     OBJECTIVE:  Today's Vitals   10/20/18 1623  BP: 124/80  Pulse: 77  Temp: 98.4 F (36.9 C)  SpO2: 100%  Weight: 109 lb (49.4 kg)   Body mass index is 19.94 kg/m.   Physical Exam Vitals signs  and nursing note reviewed.  Constitutional:      Appearance: He is well-developed.  HENT:     Head: Normocephalic and atraumatic.  Eyes:     Conjunctiva/sclera: Conjunctivae normal.     Pupils: Pupils are equal, round, and reactive to light.  Neck:     Musculoskeletal: Neck supple.  Cardiovascular:     Rate and Rhythm: Normal rate and regular rhythm.     Heart sounds: No murmur. No friction rub. No gallop.   Pulmonary:     Effort: Pulmonary effort is normal.     Breath sounds: Normal breath sounds. No wheezing or rales.  Skin:    General: Skin is warm and dry.  Neurological:     Mental Status: He is alert and oriented to person, place, and time.     No results found for this or any previous visit (from the past 24 hour(s)).  No results found.   ASSESSMENT and PLAN  1. Essential hypertension, benign Hypotension resolved. Normal bp off meds. Discussed nurse bp check and  home bp monitoring  2. Type 2 diabetes mellitus without complication, without long-term current use of insulin (HCC) Diet controlled. - urine microalbuminuria/creatinine ratio  3. Pure hypercholesterolemia Controlled. Continue current regime.  4. Decreased renal function - Basic Metabolic Panel; Future  Other orders - Blood Pressure Monitor KIT; Check blood pressure daily  Return in about 3 months (around 01/19/2019).    Rutherford Guys, MD [facility], [place] [address] Ph.  [phone] Fax [phone]

## 2018-10-21 LAB — MICROALBUMIN / CREATININE URINE RATIO
Creatinine, Urine: 88.3 mg/dL
Microalb/Creat Ratio: 10 mg/g creat (ref 0–29)
Microalbumin, Urine: 8.9 ug/mL

## 2018-11-03 ENCOUNTER — Ambulatory Visit: Payer: Medicare Other | Admitting: Family Medicine

## 2018-11-07 ENCOUNTER — Encounter: Payer: Self-pay | Admitting: Family Medicine

## 2019-01-22 ENCOUNTER — Ambulatory Visit: Payer: Medicare Other | Admitting: Family Medicine

## 2019-01-25 ENCOUNTER — Ambulatory Visit: Payer: Medicare Other | Admitting: Family Medicine

## 2019-01-29 ENCOUNTER — Encounter: Payer: Self-pay | Admitting: Family Medicine

## 2019-02-26 ENCOUNTER — Ambulatory Visit (INDEPENDENT_AMBULATORY_CARE_PROVIDER_SITE_OTHER): Payer: Medicaid Other

## 2019-02-26 ENCOUNTER — Other Ambulatory Visit: Payer: Self-pay

## 2019-02-26 ENCOUNTER — Ambulatory Visit (INDEPENDENT_AMBULATORY_CARE_PROVIDER_SITE_OTHER): Payer: Medicare HMO | Admitting: Podiatry

## 2019-02-26 DIAGNOSIS — E0843 Diabetes mellitus due to underlying condition with diabetic autonomic (poly)neuropathy: Secondary | ICD-10-CM

## 2019-02-26 DIAGNOSIS — M7751 Other enthesopathy of right foot: Secondary | ICD-10-CM

## 2019-02-26 DIAGNOSIS — M79676 Pain in unspecified toe(s): Secondary | ICD-10-CM

## 2019-02-26 DIAGNOSIS — M7752 Other enthesopathy of left foot: Secondary | ICD-10-CM

## 2019-02-26 DIAGNOSIS — M778 Other enthesopathies, not elsewhere classified: Secondary | ICD-10-CM

## 2019-02-26 DIAGNOSIS — B351 Tinea unguium: Secondary | ICD-10-CM

## 2019-02-26 MED ORDER — MELOXICAM 15 MG PO TABS: 15.0000 mg | ORAL_TABLET | Freq: Every day | ORAL | 1 refills | Status: DC

## 2019-03-01 NOTE — Progress Notes (Signed)
   SUBJECTIVE Patient with a history of diabetes mellitus presents to office today complaining of elongated, thickened nails that cause pain while ambulating in shoes. He is unable to trim his own nails.  He also notes a new complaint of a stinging pain to the dorsal midfoot and plantar arch that has been ongoing for the past year. Walking increases the pain. He has been soaking the feet in Epsom salt, taking OTC pain medication and applying pain cream and patches for treatment. Patient is here for further evaluation and treatment.   Past Medical History:  Diagnosis Date  . Diabetes mellitus without complication ([place])   . Hyperlipidemia   . Hypertension   . Tobacco use     OBJECTIVE General Patient is awake, alert, and oriented x 3 and in no acute distress. Derm Skin is dry and supple bilateral. Negative open lesions or macerations. Remaining integument unremarkable. Nails are tender, long, thickened and dystrophic with subungual debris, consistent with onychomycosis, 1-5 bilateral. No signs of infection noted. Vasc  DP and PT pedal pulses palpable bilaterally. Temperature gradient within normal limits.  Neuro Epicritic and protective threshold sensation diminished bilaterally.  Musculoskeletal Exam Pain with palpation noted to the bilateral midfoot. No symptomatic pedal deformities noted bilateral. Muscular strength within normal limits.  ASSESSMENT 1. Diabetes Mellitus w/ peripheral neuropathy 2. Onychomycosis of nail due to dermatophyte bilateral 3. Bilateral midfoot capsulitis   PLAN OF CARE 1. Patient evaluated today. X-Rays reviewed.  2. Instructed to maintain good pedal hygiene and foot care. Stressed importance of controlling blood sugar.  3. Mechanical debridement of nails 1-5 bilaterally performed using a nail nipper. Filed with dremel without incident.  4. Injection of 0.5 mLs Celestone Soluspan injected into the bilateral midfoot.  5. Prescription for Meloxicam provided to  patient. 6. Return to clinic in 4 weeks.  Disabled.    Edrick Kins, DPM [facility]  Dr. Edrick Kins, [place]                                        [place], [place] [address]                Office [phone]  Fax [phone]

## 2019-03-21 ENCOUNTER — Other Ambulatory Visit: Payer: Self-pay

## 2019-03-21 MED ORDER — MELOXICAM 15 MG PO TABS: 15.0000 mg | ORAL_TABLET | Freq: Every day | ORAL | 1 refills | Status: DC

## 2019-03-21 NOTE — Progress Notes (Signed)
Rx meloxicam 15mg  sent to [facility] on [address]

## 2019-03-26 ENCOUNTER — Ambulatory Visit: Payer: Medicare HMO | Admitting: Podiatry

## 2019-03-30 ENCOUNTER — Encounter: Payer: Self-pay | Admitting: Podiatry

## 2019-03-30 ENCOUNTER — Ambulatory Visit (INDEPENDENT_AMBULATORY_CARE_PROVIDER_SITE_OTHER): Payer: Medicare Other | Admitting: Podiatry

## 2019-03-30 ENCOUNTER — Other Ambulatory Visit: Payer: Self-pay

## 2019-03-30 DIAGNOSIS — M778 Other enthesopathies, not elsewhere classified: Secondary | ICD-10-CM

## 2019-03-30 DIAGNOSIS — M19071 Primary osteoarthritis, right ankle and foot: Secondary | ICD-10-CM

## 2019-03-30 DIAGNOSIS — M19079 Primary osteoarthritis, unspecified ankle and foot: Secondary | ICD-10-CM

## 2019-03-30 NOTE — Progress Notes (Signed)
  Subjective:  Patient ID: Ruben Beams., male    DOB: [birth year],  MRN: [id number]  Chief Complaint  Patient presents with  . Foot Pain    Bilateral capsulitis follow up. Pt states bilateral dorsal midfoot is still painful, although his previous injections did help a little.    59 y.o. male presents with the above complaint.  Patient presents with a follow-up bilateral dorsal midfoot arthritic changes that received injections by Dr. Amalia Hailey.  Patient states the left side completely resolved with injection however the right side only got 30% relief.  Patient states is still painful to touch.  Is very painful to ambulate on.  He denies any other acute plaints.  He would like to know if there is anything else that could be done for this.   Review of Systems: Negative except as noted in the HPI. Denies N/V/F/Ch.  Past Medical History:  Diagnosis Date  . Diabetes mellitus without complication ([place])   . Hyperlipidemia   . Hypertension   . Tobacco use     Current Outpatient Medications:  .  atorvastatin (LIPITOR) 20 MG tablet, Take 20 mg by mouth daily., Disp: , Rfl:  .  Blood Pressure Monitor KIT, Check blood pressure daily, Disp: 1 kit, Rfl: 0 .  meloxicam (MOBIC) 15 MG tablet, Take 1 tablet (15 mg total) by mouth daily., Disp: 30 tablet, Rfl: 1 .  varenicline (CHANTIX STARTING MONTH PAK) 0.5 MG X 11 & 1 MG X 42 tablet, Take one 0.5 mg tablet by mouth once daily for 3 days, then increase to one 0.5 mg tablet twice daily for 4 days, then increase to one 1 mg tablet twice daily., Disp: 53 tablet, Rfl: 0  Social History   Tobacco Use  Smoking Status Current Every Day Smoker  . Packs/day: 0.50  . Types: Cigarettes  Smokeless Tobacco Never Used    No Known Allergies Objective:  There were no vitals filed for this visit. There is no height or weight on file to calculate BMI. Constitutional Well developed. Well nourished.  Vascular Dorsalis pedis pulses palpable  bilaterally. Posterior tibial pulses palpable bilaterally. Capillary refill normal to all digits.  No cyanosis or clubbing noted. Pedal hair growth normal.  Neurologic Normal speech. Oriented to person, place, and time. Epicritic sensation to light touch grossly present bilaterally.  Dermatologic Nails well groomed and normal in appearance. No open wounds. No skin lesions.  Orthopedic:  Pain on palpation to the right midfoot generalized with some local point of maximal tenderness.  Mild pain with range of motion of the tarsometatarsal joints.  No pain on palpation to the left midfoot   Radiographs: None Assessment:  No diagnosis found. Plan:  Patient was evaluated and treated and all questions answered.  Left midfoot arthritis -Resolved after 1 steroid injection given by Dr. Amalia Hailey.  Right midfoot arthritis -It appears that patient got 30 to 40% improvement with steroid injection.  I believe that patient will benefit from another steroid injection I explained to the patient that sometimes it takes more than 1 injection to decrease the acute inflammatory component of pain associated with arthritis.  Patient states understanding would like to proceed with the injection. -A steroid injection was performed at right dorsal midfoot at point of maximal tenderness using 1% plain Lidocaine and 10 mg of Kenalog. This was well tolerated.   Return in about 4 weeks (around 04/27/2019), or see Dr. Amalia Hailey.

## 2019-04-30 ENCOUNTER — Ambulatory Visit (INDEPENDENT_AMBULATORY_CARE_PROVIDER_SITE_OTHER): Payer: Medicare Other | Admitting: Podiatry

## 2019-04-30 ENCOUNTER — Other Ambulatory Visit: Payer: Self-pay

## 2019-04-30 VITALS — Temp 97.9°F

## 2019-04-30 DIAGNOSIS — M778 Other enthesopathies, not elsewhere classified: Secondary | ICD-10-CM

## 2019-04-30 DIAGNOSIS — L97512 Non-pressure chronic ulcer of other part of right foot with fat layer exposed: Secondary | ICD-10-CM | POA: Diagnosis not present

## 2019-04-30 DIAGNOSIS — E0843 Diabetes mellitus due to underlying condition with diabetic autonomic (poly)neuropathy: Secondary | ICD-10-CM

## 2019-05-03 NOTE — Progress Notes (Signed)
   Subjective:  59 y.o. male with PMHx of diabetes mellitus presenting today for follow up evaluation of bilateral foot pain. He reports pain where he last received injections. He has not done anything at home for treatment. There are no modifying factors noted. Patient is here for further evaluation and treatment.   Past Medical History:  Diagnosis Date  . Diabetes mellitus without complication ([employer])   . Hyperlipidemia   . Hypertension   . Tobacco use       Objective/Physical Exam General: The patient is alert and oriented x3 in no acute distress.  Dermatology:  Wound #1 noted to the right dorsal midfoot measuring approximately 0.6 x 0.7 x 0.1 cm (LxWxD).   To the noted ulceration(s), there is no eschar. There is a moderate amount of slough, fibrin, and necrotic tissue noted. Granulation tissue and wound base is red. There is a minimal amount of serosanguineous drainage noted. There is no exposed bone muscle-tendon ligament or joint. There is no malodor. Periwound integrity is intact. Skin is warm, dry and supple bilateral lower extremities.  Vascular: Palpable pedal pulses bilaterally. No edema or erythema noted. Capillary refill within normal limits.  Neurological: Epicritic and protective threshold diminished bilaterally.   Musculoskeletal Exam: Range of motion within normal limits to all pedal and ankle joints bilateral. Muscle strength 5/5 in all groups bilateral.   Assessment: 1. Ulceration of the right dorsal midfoot secondary to diabetes mellitus 2. Capsulitis bilateral right worse than left  3. diabetes mellitus w/ peripheral neuropathy   Plan of Care:  1. Patient was evaluated. 2. medically necessary excisional debridement including subcutaneous tissue was performed using a tissue nipper and a chisel blade. Excisional debridement of all the necrotic nonviable tissue down to healthy bleeding viable tissue was performed with post-debridement measurements same as  pre-. 3. the wound was cleansed and dry sterile dressing applied. 4. Recommended Silvadene cream daily with a bandage.  5. patient is to return to clinic in 4 weeks.   Edrick Kins, DPM [facility]  Dr. Edrick Kins, [place]                                        [place], [place] [address]                Office [phone]  Fax [phone]

## 2019-05-28 ENCOUNTER — Other Ambulatory Visit: Payer: Self-pay

## 2019-05-28 ENCOUNTER — Ambulatory Visit (INDEPENDENT_AMBULATORY_CARE_PROVIDER_SITE_OTHER): Payer: Medicare Other | Admitting: Podiatry

## 2019-05-28 ENCOUNTER — Telehealth: Payer: Self-pay | Admitting: *Deleted

## 2019-05-28 VITALS — Temp 97.0°F

## 2019-05-28 DIAGNOSIS — I70229 Atherosclerosis of native arteries of extremities with rest pain, unspecified extremity: Secondary | ICD-10-CM

## 2019-05-28 DIAGNOSIS — E0843 Diabetes mellitus due to underlying condition with diabetic autonomic (poly)neuropathy: Secondary | ICD-10-CM | POA: Diagnosis not present

## 2019-05-28 DIAGNOSIS — L97512 Non-pressure chronic ulcer of other part of right foot with fat layer exposed: Secondary | ICD-10-CM

## 2019-05-28 DIAGNOSIS — I998 Other disorder of circulatory system: Secondary | ICD-10-CM | POA: Diagnosis not present

## 2019-05-28 NOTE — Telephone Encounter (Signed)
Pt is established with [facility] on [facility] [address] Dr. Amalia Hailey would like pt referred as urgent. Prepared available clinical and demographics to be faxed with referral to [facility], once 05/28/2019 clinicals are available.

## 2019-05-28 NOTE — Progress Notes (Signed)
   HPI: 59 y.o. male presenting today for follow-up evaluation of right lower extremity pain.  The patient has developed a superficial ulcer to the right dorsal midfoot.  Patient was last seen on 04/30/2019.  Patient states that the pain has increased recently.  He is also noticed increased ulcers developing along the dorsal midfoot.  He presents with throbbing and he is currently on antibiotics from his primary care physician.  Patient is an active smoker for several years  Past Medical History:  Diagnosis Date  . Diabetes mellitus without complication ([place])   . Hyperlipidemia   . Hypertension   . Tobacco use         Physical Exam: General: The patient is alert and oriented x3 in no acute distress.  Dermatology: Skin is warm, dry and supple bilateral lower extremities. Negative for open lesions or macerations.  Vascular: NonPalpable pedal pulses right lower extremity today. No edema or erythema noted.  There is some dusky discoloration to digits 1-5 of the right foot compared to the contralateral limb  Neurological: Epicritic and protective threshold diminished bilaterally.   Musculoskeletal Exam: Range of motion within normal limits to all pedal and ankle joints bilateral. Muscle strength 5/5 in all groups bilateral.   Assessment: 1.  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  Diabetes mellitus with peripheral polyneuropathy 3.  Right lower extremity limb ischemia   Plan of Care:  1. Patient evaluated.  2.  Orders placed today for arterial Dopplers right lower extremity.  If arterial Doppler is abnormal he will need an immediate consult with vascular 3.  Continue Silvadene cream and light dressing 4.  Return to clinic in 3 weeks      Edrick Kins, DPM [facility]  Dr. Edrick Kins, DPM    [address], [place] [address]                Office [phone]  Fax [phone]

## 2019-06-07 NOTE — Telephone Encounter (Signed)
Faxed referral to [facility].

## 2019-06-11 ENCOUNTER — Telehealth: Payer: Self-pay | Admitting: Podiatry

## 2019-06-11 NOTE — Telephone Encounter (Signed)
Pt called states the number he was given was to a heart doctor and he doesn't need a heart doctor he needs something to heal his foot.

## 2019-06-11 NOTE — Telephone Encounter (Signed)
I spoke with pt and informed the referral was made to [facility] 05/28/2019 and gave [phone] to call to schedule.

## 2019-06-11 NOTE — Telephone Encounter (Signed)
Pt called wanting an update on the referral to [facility] please advise

## 2019-06-11 NOTE — Telephone Encounter (Signed)
Left message informing pt the heart doctor would help with his circulation which would help with the healing of the foot wounds, [facility] [phone].

## 2019-06-12 ENCOUNTER — Telehealth: Payer: Self-pay

## 2019-06-12 NOTE — Telephone Encounter (Signed)
NOTES ON FILE FROM  [facility] . REFERRAL SENT TO SCHEDULING

## 2019-06-12 NOTE — Telephone Encounter (Signed)
Pt called and states he needs an appt to get his feet healed. I told pt that he needs to see the heart doctor so the circulation to can be improved to help his feet heal. Pt states understanding and I gave the [facility] [phone].

## 2019-06-17 NOTE — Progress Notes (Signed)
Cardiology Office Note:   Date:  06/19/2019  NAME:  Ruben J Cunliffe Jr.    MRN: [id number] DOB:  09/28/1960   PCP:  Daye, Deneda T, FNP  Cardiologist:  No primary care provider on file.   Referring MD: Evans, Brent M, DPM   Chief Complaint  Patient presents with  . Claudication    History of Present Illness:   Ruben J Bachmann Jr. is a 58 y.o. male with a hx of DM, HTN, HLD, tobacco abuse who is being seen today for the evaluation of foot ulcer/PAD at the request of Evans, Brent M, DPM. He reports that he had steroid shots in his right foot about 5 months ago. Apparently after the shots he developed nonhealing ulcers. He was evaluated by podiatrist who recommended he be evaluated for PAD given his lack of pulses in the right lower extremity. He reports his ulcer started after the shots 5 months ago. He is in quite a deal of pain daily. Reports pain is worse with walking and activity. He also reports that he has intermittent episodes of thigh cramping in the right lower extremity and thigh. Symptoms are improved by rest. He does have a significant smoking history nearly 40 pack years. He is still smoking 1/2 pack/day. CVD risk factors include hypertension and prediabetes. His most recent cholesterol levels were acceptable. His EKG today demonstrates normal sinus rhythm with possibly an old anteroseptal infarct. No prior history per his knowledge. No echocardiogram in the system. No strong family history of heart disease.  Problem List  1. Diabetes -A1c 6.3 2. CKD 3 3. HLD -T chol 154, HDL 43, LDL 89, TG 121 4. HTN 5. Tobacco abuse   Past Medical History: Past Medical History:  Diagnosis Date  . Diabetes mellitus without complication (HCC)   . Hyperlipidemia   . Hypertension   . Tobacco use     Past Surgical History: Past Surgical History:  Procedure Laterality Date  . arm surgery      Current Medications: Current Meds  Medication Sig  . atorvastatin (LIPITOR) 20 MG  tablet Take 20 mg by mouth daily.  . Blood Pressure Monitor KIT Check blood pressure daily  . meloxicam (MOBIC) 15 MG tablet Take 1 tablet (15 mg total) by mouth daily.  . varenicline (CHANTIX STARTING MONTH PAK) 0.5 MG X 11 & 1 MG X 42 tablet Take one 0.5 mg tablet by mouth once daily for 3 days, then increase to one 0.5 mg tablet twice daily for 4 days, then increase to one 1 mg tablet twice daily.     Allergies:    Patient has no known allergies.   Social History: Social History   Socioeconomic History  . Marital status: Legally Separated    Spouse name: Not on file  . Number of children: Not on file  . Years of education: Not on file  . Highest education level: Not on file  Occupational History  . Not on file  Tobacco Use  . Smoking status: Current Every Day Smoker    Packs/day: 0.50    Years: 40.00    Pack years: 20.00    Types: Cigarettes  . Smokeless tobacco: Never Used  Substance and Sexual Activity  . Alcohol use: Yes    Comment: beer and liquor daily  . Drug use: Yes    Frequency: 3.0 times per week    Types: Marijuana  . Sexual activity: Not on file  Other Topics Concern  . Not on file    Social History Narrative  . Not on file   Social Determinants of Health   Financial Resource Strain:   . Difficulty of Paying Living Expenses:   Food Insecurity:   . Worried About Running Out of Food in the Last Year:   . Ran Out of Food in the Last Year:   Transportation Needs:   . Lack of Transportation (Medical):   . Lack of Transportation (Non-Medical):   Physical Activity:   . Days of Exercise per Week:   . Minutes of Exercise per Session:   Stress:   . Feeling of Stress :   Social Connections:   . Frequency of Communication with Friends and Family:   . Frequency of Social Gatherings with Friends and Family:   . Attends Religious Services:   . Active Member of Clubs or Organizations:   . Attends Club or Organization Meetings:   . Marital Status:       Family History: The patient's family history includes Cancer in his mother; Diabetes in his father.  ROS:   All other ROS reviewed and negative. Pertinent positives noted in the HPI.     EKGs/Labs/Other Studies Reviewed:   The following studies were personally reviewed by me today:  EKG:  EKG is ordered today.  The ekg ordered today demonstrates normal sinus rhythm, heart rate 64, old anteroseptal infarct, no acute ischemic changes, and was personally reviewed by me.   Recent Labs: 10/13/2018: ALT 23; BUN 26; Creatinine, Ser 1.55; Potassium 3.9; Sodium 147; TSH 0.590   Recent Lipid Panel    Component Value Date/Time   CHOL 154 10/13/2018 1008   TRIG 121 10/13/2018 1008   HDL 43 10/13/2018 1008   CHOLHDL 3.6 10/13/2018 1008   LDLCALC 89 10/13/2018 1008    Physical Exam:   VS:  BP (!) 142/80   Pulse 64   Ht 5' 2" (1.575 m)   Wt 113 lb 3.2 oz (51.3 kg)   SpO2 98%   BMI 20.70 kg/m    Wt Readings from Last 3 Encounters:  06/19/19 113 lb 3.2 oz (51.3 kg)  10/20/18 109 lb (49.4 kg)  10/13/18 108 lb (49 kg)    General: Well nourished, well developed, in no acute distress Heart: Atraumatic, normal size  Eyes: PEERLA, EOMI  Neck: Supple, no JVD Endocrine: No thryomegaly Cardiac: Normal S1, S2; RRR; no murmurs, rubs, or gallops Lungs: Clear to auscultation bilaterally, no wheezing, rhonchi or rales  Abd: Soft, nontender, no hepatomegaly  Ext: Diminished pulses in the left lower extremity, absent pulses in the right lower extremity, 2 clean punched-out ulcers noted over the right dorsal aspect of the foot. They appear clean and dry. The foot overall is warm to touch without signs of infection Musculoskeletal: No deformities, BUE and BLE strength normal and equal Neuro: Alert and oriented to person, place, time, and situation, CNII-XII grossly intact, no focal deficits  Psych: Normal mood and affect   ASSESSMENT:   Ruben J Daughtrey Jr. is a 58 y.o. male who presents for the  following: 1. Ulcer of foot, unspecified laterality, unspecified ulcer stage (HCC)   2. Mixed hyperlipidemia   3. Essential hypertension   4. Nonspecific abnormal electrocardiogram (ECG) (EKG)     PLAN:   1. Ulcer of foot, unspecified laterality, unspecified ulcer stage (HCC) -Absent pulses in the right lower extremity. He is clean-based dry ulcers on the right foot. I highly suspect he has PAD and nonhealing ulcers do this. We will proceed with vascular   ultrasounds of the arteries below. I did briefly discussed the case with Dr. Alvester Chou we will proceed with his first. I suspect he will have significant PAD that will need evaluation with angiogram.  2. Mixed hyperlipidemia -We will continue to monitor for now. His most recent LDL cholesterol was acceptable. Should he have PAD he will need to be on a statin. Also will likely need to be on aspirin. We will discuss this after we know the results of his test.  3. Essential hypertension -Acceptable today. Is unclear what medications he is taking. When he sees his back he will bring his medications with him.  4. 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 Could be hypertensive related. We will obtain an echocardiogram.  Disposition: Return in about 1 month (around 07/20/2019).  Medication Adjustments/Labs and Tests Ordered: Current medicines are reviewed at length with the patient today.  Concerns regarding medicines are outlined above.  Orders Placed This Encounter  Procedures  . EKG 12-Lead  . ECHOCARDIOGRAM COMPLETE  . VAS [place] LOWER EXTREMITY ARTERIAL DUPLEX   No orders of the defined types were placed in this encounter.   Patient Instructions  Medication Instructions:  The current medical regimen is effective;  continue present plan and medications.  *If you need a refill on your cardiac medications before your next appointment, please call your pharmacy*   Testing/Procedures: Echocardiogram  - Your physician has requested that you have an echocardiogram. Echocardiography is a painless test that uses sound waves to create images of your heart. It provides your doctor with information about the size and shape of your heart and how well your heart's chambers and valves are working. This procedure takes approximately one hour. There are no restrictions for this procedure. This will be performed at our [facility] location - [address], [address].  Your physician has requested that you have a lower or upper extremity arterial duplex. This test is an ultrasound of the arteries in the legs or arms. It looks at arterial blood flow in the legs and arms. Allow one hour for Lower and Upper Arterial scans. There are no restrictions or special instructions    Follow-Up: At [facility], you and your health needs are our priority.  As part of our continuing mission to provide you with exceptional heart care, we have created designated Provider Care Teams.  These Care Teams include your primary Cardiologist (physician) and Advanced Practice Providers (APPs -  Physician Assistants and Nurse Practitioners) who all work together to provide you with the care you need, when you need it.  We recommend signing up for the patient portal called "MyChart".  Sign up information is provided on this After Visit Summary.  MyChart is used to connect with patients for Virtual Visits (Telemedicine).  Patients are able to view lab/test results, encounter notes, upcoming appointments, etc.  Non-urgent messages can be sent to your provider as well.   To learn more about what you can do with MyChart, go to [email or web address].    Your next appointment:   1 month(s)  The format for your next appointment:   In Person  Provider:   Eleonore Chiquito, MD     Signed, Addison Naegeli. Audie Box, [place]  [address], [place], [place] [address] [phone]  06/19/2019 12:16 PM

## 2019-06-19 ENCOUNTER — Encounter: Payer: Self-pay | Admitting: Cardiovascular Disease

## 2019-06-19 ENCOUNTER — Other Ambulatory Visit: Payer: Self-pay

## 2019-06-19 ENCOUNTER — Ambulatory Visit (INDEPENDENT_AMBULATORY_CARE_PROVIDER_SITE_OTHER): Payer: Medicare Other | Admitting: Cardiovascular Disease

## 2019-06-19 VITALS — BP 142/80 | HR 64 | Ht 62.0 in | Wt 113.2 lb

## 2019-06-19 DIAGNOSIS — I10 Essential (primary) hypertension: Secondary | ICD-10-CM

## 2019-06-19 DIAGNOSIS — E782 Mixed hyperlipidemia: Secondary | ICD-10-CM

## 2019-06-19 DIAGNOSIS — R9431 Abnormal electrocardiogram [ECG] [EKG]: Secondary | ICD-10-CM | POA: Diagnosis not present

## 2019-06-19 DIAGNOSIS — L97509 Non-pressure chronic ulcer of other part of unspecified foot with unspecified severity: Secondary | ICD-10-CM

## 2019-06-19 NOTE — Patient Instructions (Addendum)
Medication Instructions:  The current medical regimen is effective;  continue present plan and medications.  *If you need a refill on your cardiac medications before your next appointment, please call your pharmacy*   Testing/Procedures: Echocardiogram - Your physician has requested that you have an echocardiogram. Echocardiography is a painless test that uses sound waves to create images of your heart. It provides your doctor with information about the size and shape of your heart and how well your heart's chambers and valves are working. This procedure takes approximately one hour. There are no restrictions for this procedure. This will be performed at our [facility] location - [address].  Your physician has requested that you have a lower or upper extremity arterial duplex. This test is an ultrasound of the arteries in the legs or arms. It looks at arterial blood flow in the legs and arms. Allow one hour for Lower and Upper Arterial scans. There are no restrictions or special instructions    Follow-Up: At [facility], you and your health needs are our priority.  As part of our continuing mission to provide you with exceptional heart care, we have created designated Provider Care Teams.  These Care Teams include your primary Cardiologist (physician) and Advanced Practice Providers (APPs -  Physician Assistants and Nurse Practitioners) who all work together to provide you with the care you need, when you need it.  We recommend signing up for the patient portal called "MyChart".  Sign up information is provided on this After Visit Summary.  MyChart is used to connect with patients for Virtual Visits (Telemedicine).  Patients are able to view lab/test results, encounter notes, upcoming appointments, etc.  Non-urgent messages can be sent to your provider as well.   To learn more about what you can do with MyChart, go to [email or web address].    Your next appointment:   1  month(s)  The format for your next appointment:   In Person  Provider:   Eleonore Chiquito, MD

## 2019-06-27 ENCOUNTER — Other Ambulatory Visit (HOSPITAL_COMMUNITY): Payer: Self-pay | Admitting: Cardiovascular Disease

## 2019-06-27 DIAGNOSIS — M79604 Pain in right leg: Secondary | ICD-10-CM

## 2019-07-10 ENCOUNTER — Other Ambulatory Visit: Payer: Self-pay

## 2019-07-10 ENCOUNTER — Telehealth: Payer: Self-pay | Admitting: Cardiovascular Disease

## 2019-07-10 ENCOUNTER — Encounter: Payer: Self-pay | Admitting: Cardiovascular Disease

## 2019-07-10 ENCOUNTER — Ambulatory Visit (HOSPITAL_COMMUNITY)
Admission: RE | Admit: 2019-07-10 | Discharge: 2019-07-10 | Disposition: A | Discharge status: Home / Self Care | Payer: Medicare Other | Source: Ambulatory Visit | Attending: Cardiology | Admitting: Cardiology

## 2019-07-10 ENCOUNTER — Ambulatory Visit (INDEPENDENT_AMBULATORY_CARE_PROVIDER_SITE_OTHER): Payer: Medicare Other | Admitting: Cardiovascular Disease

## 2019-07-10 DIAGNOSIS — M79604 Pain in right leg: Secondary | ICD-10-CM | POA: Insufficient documentation

## 2019-07-10 DIAGNOSIS — Z01810 Encounter for preprocedural cardiovascular examination: Secondary | ICD-10-CM | POA: Diagnosis not present

## 2019-07-10 DIAGNOSIS — L97509 Non-pressure chronic ulcer of other part of unspecified foot with unspecified severity: Secondary | ICD-10-CM | POA: Insufficient documentation

## 2019-07-10 DIAGNOSIS — I70229 Atherosclerosis of native arteries of extremities with rest pain, unspecified extremity: Secondary | ICD-10-CM

## 2019-07-10 DIAGNOSIS — I998 Other disorder of circulatory system: Secondary | ICD-10-CM | POA: Diagnosis not present

## 2019-07-10 DIAGNOSIS — Z01812 Encounter for preprocedural laboratory examination: Secondary | ICD-10-CM

## 2019-07-10 NOTE — Addendum Note (Signed)
Addended by: Meryl Crutch on: 07/10/2019 02:45 PM   Modules accepted: Orders

## 2019-07-10 NOTE — Telephone Encounter (Signed)
Patient would like to know where to go for his appt on 07/16/19. He states he knows to go to [facility] but would like to know exactly where to go.

## 2019-07-10 NOTE — H&P (View-Only) (Signed)
07/10/2019 [place].   May 08, 1960  [id number]  Primary Physician Loretha Brasil, FNP Primary Cardiologist: Lorretta Harp MD Lupe Carney, [place]  HPI:  [name]. is a 59 y.o. thin and chronically ill-appearing separated African-American male with no children who does not work because of being disabled.  Is referred by Dr. Audie Box to me for evaluation treatment of critical limb ischemia.  The patient was initially referred to our practice by Dr. Daylene Katayama, podiatry.  His risk factors include 20 to 30 pack years of tobacco use having smoked for last 45 years, treated hypertension and hyperlipidemia.  He is never had a heart attack or stroke.  He denies chest pain or shortness of breath.  He apparently had 3 cortisone shots in his right foot 4 to 5 months ago which resulted in wounds at the injection sites which have not healed.  Also complains of claudication bilaterally.  Recent Dopplers performed today revealed a right ABI of 0.60 and a left of 0.45 with occluded SFAs bilaterally.  He does have CKD 3 with serum creatinine in the mid 1 range.   No outpatient medications have been marked as taking for the 07/10/19 encounter (Office Visit) with Lorretta Harp, MD.     No Known Allergies  Social History   Socioeconomic History  . Marital status: Legally Separated    Spouse name: Not on file  . Number of children: Not on file  . Years of education: Not on file  . Highest education level: Not on file  Occupational History  . Not on file  Tobacco Use  . Smoking status: Current Every Day Smoker    Packs/day: 0.50    Years: 40.00    Pack years: 20.00    Types: Cigarettes  . Smokeless tobacco: Never Used  Substance and Sexual Activity  . Alcohol use: Yes    Comment: beer and liquor daily  . Drug use: Yes    Frequency: 3.0 times per week    Types: Marijuana  . Sexual activity: Not on file  Other Topics Concern  . Not on file  Social History  Narrative  . Not on file   Social Determinants of Health   Financial Resource Strain:   . Difficulty of Paying Living Expenses:   Food Insecurity:   . Worried About Charity fundraiser in the Last Year:   . Arboriculturist in the Last Year:   Transportation Needs:   . Film/video editor (Medical):   Marland Kitchen Lack of Transportation (Non-Medical):   Physical Activity:   . Days of Exercise per Week:   . Minutes of Exercise per Session:   Stress:   . Feeling of Stress :   Social Connections:   . Frequency of Communication with Friends and Family:   . Frequency of Social Gatherings with Friends and Family:   . Attends Religious Services:   . Active Member of Clubs or Organizations:   . Attends Archivist Meetings:   Marland Kitchen Marital Status:   Intimate Partner Violence:   . Fear of Current or Ex-Partner:   . Emotionally Abused:   Marland Kitchen Physically Abused:   . Sexually Abused:      Review of Systems: General: negative for chills, fever, night sweats or weight changes.  Cardiovascular: negative for chest pain, dyspnea on exertion, edema, orthopnea, palpitations, paroxysmal nocturnal dyspnea or shortness of breath Dermatological: negative for rash Respiratory: negative for cough  or wheezing Urologic: negative for hematuria Abdominal: negative for nausea, vomiting, diarrhea, bright red blood per rectum, melena, or hematemesis Neurologic: negative for visual changes, syncope, or dizziness All other systems reviewed and are otherwise negative except as noted above.    There were no vitals taken for this visit.  General appearance: alert and no distress Neck: no adenopathy, no carotid bruit, no JVD, supple, symmetrical, trachea midline and thyroid not enlarged, symmetric, no tenderness/mass/nodules Lungs: clear to auscultation bilaterally Heart: regular rate and rhythm, S1, S2 normal, no murmur, click, rub or gallop Extremities: extremities normal, atraumatic, no cyanosis or  edema Pulses: Absent pedal pulses Skin: 3 ischemic appearing wounds on the ventral surface of the right foot Neurologic: Grossly normal  EKG not performed today  ASSESSMENT AND PLAN:   Critical lower limb ischemia Mr. Dejaynes was referred to me by Dr. Audie Box for evaluation treatment of critical limb ischemia.  He was referred initially by Dr. Amalia Hailey, podiatrist, for evaluation of this.  He does have a history of tobacco abuse, hypertension hyperlipidemia.  He apparently had 3 cortisone shots for 5 months ago and since that time is developed 3 wounds on his right foot that have not been healing.  He is also complained of left limiting claudication.  Recent Dopplers performed today revealed a right ABI of 0.60 and a left of 0.45 with occluded SFAs bilaterally.  He will need peripheral angiography potential endovascular therapy for critical ischemia.      Lorretta Harp MD FACP,FACC,FAHA, [facility] 07/10/2019 2:27 PM

## 2019-07-10 NOTE — Progress Notes (Signed)
07/10/2019 [place].   19-Mar-1960  [id number]  Primary Physician Loretha Brasil, FNP Primary Cardiologist: Lorretta Harp MD Lupe Carney, [place]  HPI:  [name]. is a 59 y.o. thin and chronically ill-appearing separated African-American male with no children who does not work because of being disabled.  Is referred by Dr. Audie Box to me for evaluation treatment of critical limb ischemia.  The patient was initially referred to our practice by Dr. Daylene Katayama, podiatry.  His risk factors include 20 to 30 pack years of tobacco use having smoked for last 45 years, treated hypertension and hyperlipidemia.  He is never had a heart attack or stroke.  He denies chest pain or shortness of breath.  He apparently had 3 cortisone shots in his right foot 4 to 5 months ago which resulted in wounds at the injection sites which have not healed.  Also complains of claudication bilaterally.  Recent Dopplers performed today revealed a right ABI of 0.60 and a left of 0.45 with occluded SFAs bilaterally.  He does have CKD 3 with serum creatinine in the mid 1 range.   No outpatient medications have been marked as taking for the 07/10/19 encounter (Office Visit) with Lorretta Harp, MD.     No Known Allergies  Social History   Socioeconomic History  . Marital status: Legally Separated    Spouse name: Not on file  . Number of children: Not on file  . Years of education: Not on file  . Highest education level: Not on file  Occupational History  . Not on file  Tobacco Use  . Smoking status: Current Every Day Smoker    Packs/day: 0.50    Years: 40.00    Pack years: 20.00    Types: Cigarettes  . Smokeless tobacco: Never Used  Substance and Sexual Activity  . Alcohol use: Yes    Comment: beer and liquor daily  . Drug use: Yes    Frequency: 3.0 times per week    Types: Marijuana  . Sexual activity: Not on file  Other Topics Concern  . Not on file  Social History  Narrative  . Not on file   Social Determinants of Health   Financial Resource Strain:   . Difficulty of Paying Living Expenses:   Food Insecurity:   . Worried About Charity fundraiser in the Last Year:   . Arboriculturist in the Last Year:   Transportation Needs:   . Film/video editor (Medical):   Marland Kitchen Lack of Transportation (Non-Medical):   Physical Activity:   . Days of Exercise per Week:   . Minutes of Exercise per Session:   Stress:   . Feeling of Stress :   Social Connections:   . Frequency of Communication with Friends and Family:   . Frequency of Social Gatherings with Friends and Family:   . Attends Religious Services:   . Active Member of Clubs or Organizations:   . Attends Archivist Meetings:   Marland Kitchen Marital Status:   Intimate Partner Violence:   . Fear of Current or Ex-Partner:   . Emotionally Abused:   Marland Kitchen Physically Abused:   . Sexually Abused:      Review of Systems: General: negative for chills, fever, night sweats or weight changes.  Cardiovascular: negative for chest pain, dyspnea on exertion, edema, orthopnea, palpitations, paroxysmal nocturnal dyspnea or shortness of breath Dermatological: negative for rash Respiratory: negative for cough  or wheezing Urologic: negative for hematuria Abdominal: negative for nausea, vomiting, diarrhea, bright red blood per rectum, melena, or hematemesis Neurologic: negative for visual changes, syncope, or dizziness All other systems reviewed and are otherwise negative except as noted above.    There were no vitals taken for this visit.  General appearance: alert and no distress Neck: no adenopathy, no carotid bruit, no JVD, supple, symmetrical, trachea midline and thyroid not enlarged, symmetric, no tenderness/mass/nodules Lungs: clear to auscultation bilaterally Heart: regular rate and rhythm, S1, S2 normal, no murmur, click, rub or gallop Extremities: extremities normal, atraumatic, no cyanosis or  edema Pulses: Absent pedal pulses Skin: 3 ischemic appearing wounds on the ventral surface of the right foot Neurologic: Grossly normal  EKG not performed today  ASSESSMENT AND PLAN:   Critical lower limb ischemia Mr. Proehl was referred to me by Dr. Audie Box for evaluation treatment of critical limb ischemia.  He was referred initially by Dr. Amalia Hailey, podiatrist, for evaluation of this.  He does have a history of tobacco abuse, hypertension hyperlipidemia.  He apparently had 3 cortisone shots for 5 months ago and since that time is developed 3 wounds on his right foot that have not been healing.  He is also complained of left limiting claudication.  Recent Dopplers performed today revealed a right ABI of 0.60 and a left of 0.45 with occluded SFAs bilaterally.  He will need peripheral angiography potential endovascular therapy for critical ischemia.      Lorretta Harp MD FACP,FACC,FAHA, [facility] 07/10/2019 2:27 PM

## 2019-07-10 NOTE — Telephone Encounter (Signed)
Called patient . No answer , left message on voicemail  main entrance(  A)  [place]  .   written instruction  given with after summary visit .  any question may call back .

## 2019-07-10 NOTE — Patient Instructions (Addendum)
Medication Instructions:  Your Physician recommend you continue on your current medication as directed.    *If you need a refill on your cardiac medications before your next appointment, please call your pharmacy*   Lab Work: Your physician recommends that you return for lab work today ( CBC, BMP)    Testing/Procedures: Your physician has requested that you have a peripheral vascular angiogram. This exam is performed at the hospital. During this exam IV contrast is used to look at arterial blood flow. Please review the information sheet given for details.  Your physician has requested that you have an ankle brachial index (ABI) . During this test an ultrasound and blood pressure cuff are used to evaluate the arteries that supply the arms and legs with blood. Allow thirty minutes for this exam. There are no restrictions or special instructions.  Your physician has requested that you have a lower extremity arterial exercise duplex. During this test, exercise and ultrasound are used to evaluate arterial blood flow in the legs. Allow one hour for this exam. There are no restrictions or special instructions.    Follow-Up: At [facility], you and your health needs are our priority.  As part of our continuing mission to provide you with exceptional heart care, we have created designated Provider Care Teams.  These Care Teams include your primary Cardiologist (physician) and Advanced Practice Providers (APPs -  Physician Assistants and Nurse Practitioners) who all work together to provide you with the care you need, when you need it.  We recommend signing up for the patient portal called "MyChart".  Sign up information is provided on this After Visit Summary.  MyChart is used to connect with patients for Virtual Visits (Telemedicine).  Patients are able to view lab/test results, encounter notes, upcoming appointments, etc.  Non-urgent messages can be sent to your provider as well.   To learn more  about what you can do with MyChart, go to [email or web address].    Your next appointment:   3 week(s)  The format for your next appointment:   In Person  Provider:   Quay Burow, MD      [address] [place] [address] Dept: [phone] Loc: [place].  07/10/2019  You are scheduled for a Peripheral Angiogram on Monday, June 21 with Dr. Quay Burow.  1. Please arrive at the [facility] (Main Entrance A) at [facility]: [address] [place], [place] [address] at 6:30 AM (This time is two hours before your procedure to ensure your preparation). Free valet parking service is available.   Special note: Every effort is made to have your procedure done on time. Please understand that emergencies sometimes delay scheduled procedures.  2. Diet: Do not eat solid foods after midnight.  The patient may have clear liquids until 5am upon the day of the procedure.  3. Labs: You will need to have blood drawn today ( CBC. BMP)  4. Medication instructions in preparation for your procedure:   Contrast Allergy: No     On the morning of your procedure, take your Aspirin and any morning medicines NOT listed above.  You may use sips of water.  5. Plan for one night stay--bring personal belongings. 6. Bring a current list of your medications and current insurance cards. 7. You MUST have a responsible person to drive you home. 8. Someone MUST be with you the first 24 hours  after you arrive home or your discharge will be delayed. 9. Please wear clothes that are easy to get on and off and wear slip-on shoes.  Thank you for allowing [place] to care for you!   -- [facility]

## 2019-07-10 NOTE — Assessment & Plan Note (Signed)
[name] was referred to me by Dr. Audie Box for evaluation treatment of critical limb ischemia.  He was referred initially by Dr. Amalia Hailey, podiatrist, for evaluation of this.  He does have a history of tobacco abuse, hypertension hyperlipidemia.  He apparently had 3 cortisone shots for 5 months ago and since that time is developed 3 wounds on his right foot that have not been healing.  He is also complained of left limiting claudication.  Recent Dopplers performed today revealed a right ABI of 0.60 and a left of 0.45 with occluded SFAs bilaterally.  He will need peripheral angiography potential endovascular therapy for critical ischemia.

## 2019-07-11 LAB — BASIC METABOLIC PANEL
BUN/Creatinine Ratio: 13 (ref 9–20)
BUN: 16 mg/dL (ref 6–24)
CO2: 26 mmol/L (ref 20–29)
Calcium: 9.5 mg/dL (ref 8.7–10.2)
Chloride: 104 mmol/L (ref 96–106)
Creatinine, Ser: 1.19 mg/dL (ref 0.76–1.27)
GFR calc Af Amer: 77 mL/min/{1.73_m2} (ref >59)
GFR calc non Af Amer: 66 mL/min/{1.73_m2} (ref >59)
Glucose: 84 mg/dL (ref 65–99)
Potassium: 4.7 mmol/L (ref 3.5–5.2)
Sodium: 143 mmol/L (ref 134–144)

## 2019-07-11 LAB — CBC
Hematocrit: 43.7 % (ref 37.5–51.0)
Hemoglobin: 14.1 g/dL (ref 13.0–17.7)
MCH: 29.2 pg (ref 26.6–33.0)
MCHC: 32.3 g/dL (ref 31.5–35.7)
MCV: 91 fL (ref 79–97)
Platelets: 216 10*3/uL (ref 150–450)
RBC: 4.83 x10E6/uL (ref 4.14–5.80)
RDW: 13.6 % (ref 11.6–15.4)
WBC: 5.9 10*3/uL (ref 3.4–10.8)

## 2019-07-12 ENCOUNTER — Ambulatory Visit (HOSPITAL_COMMUNITY): Payer: Medicare Other | Attending: Cardiovascular Disease

## 2019-07-12 ENCOUNTER — Other Ambulatory Visit: Payer: Self-pay

## 2019-07-12 ENCOUNTER — Telehealth: Payer: Self-pay | Admitting: *Deleted

## 2019-07-12 DIAGNOSIS — R9431 Abnormal electrocardiogram [ECG] [EKG]: Secondary | ICD-10-CM | POA: Diagnosis not present

## 2019-07-12 NOTE — Telephone Encounter (Addendum)
Pt contacted pre-abdominal aortogram scheduled at [facility] for: Monday July 16, 2019 11:30 AM Verified arrival time and place: [facility]) at: 9:30 AM   No solid food after midnight prior to cath, clear liquids until 5 AM day of procedure.  Hold: Lisinopril-HCT -AM of procedure   Except hold medications AM meds can be  taken pre-cath with sips of water including: ASA 81 mg   Confirmed patient has responsible adult to drive home post procedure and observe 24 hours after arriving home:   You are allowed ONE visitor in the waiting room during your procedure. Both you and your visitor must wear masks.      COVID-19 Pre-Screening Questions:  . In the past 7 to 10 days have you had a new cough, shortness of breath, headache, congestion, fever (100 or greater) unexplained body aches, new sore throat, or sudden loss of taste or sense of smell? Marland Kitchen In the past 7 to 10 days have you been around anyone with known Covid 19?     LMTCB to review procedure instructions with patient.

## 2019-07-12 NOTE — Telephone Encounter (Signed)
Voicemail message, no answer. 

## 2019-07-13 NOTE — Telephone Encounter (Addendum)
Patient returning call, he has questions about his medication he has to take that day.

## 2019-07-13 NOTE — Telephone Encounter (Signed)
Left message for patient, per instructions from [facility]. Only medication to hold on morning of procedure is Lisinopril -HCTZ. May take all other medications and an aspirin 81mg .

## 2019-07-16 ENCOUNTER — Ambulatory Visit (HOSPITAL_COMMUNITY)
Admission: RE | Admit: 2019-07-16 | Discharge: 2019-07-17 | Disposition: A | Discharge status: Home / Self Care | Payer: Medicare Other | Attending: Cardiovascular Disease | Admitting: Cardiovascular Disease

## 2019-07-16 ENCOUNTER — Other Ambulatory Visit: Payer: Self-pay

## 2019-07-16 ENCOUNTER — Encounter (HOSPITAL_COMMUNITY)
Admission: RE | Disposition: A | Discharge status: Home / Self Care | Payer: Self-pay | Source: Home / Self Care | Attending: Cardiovascular Disease

## 2019-07-16 DIAGNOSIS — Z7982 Long term (current) use of aspirin: Secondary | ICD-10-CM | POA: Diagnosis not present

## 2019-07-16 DIAGNOSIS — Z79899 Other long term (current) drug therapy: Secondary | ICD-10-CM | POA: Diagnosis not present

## 2019-07-16 DIAGNOSIS — E1122 Type 2 diabetes mellitus with diabetic chronic kidney disease: Secondary | ICD-10-CM | POA: Insufficient documentation

## 2019-07-16 DIAGNOSIS — I998 Other disorder of circulatory system: Secondary | ICD-10-CM | POA: Diagnosis not present

## 2019-07-16 DIAGNOSIS — Z7902 Long term (current) use of antithrombotics/antiplatelets: Secondary | ICD-10-CM | POA: Diagnosis not present

## 2019-07-16 DIAGNOSIS — N183 Chronic kidney disease, stage 3 unspecified: Secondary | ICD-10-CM | POA: Insufficient documentation

## 2019-07-16 DIAGNOSIS — I70235 Atherosclerosis of native arteries of right leg with ulceration of other part of foot: Secondary | ICD-10-CM | POA: Insufficient documentation

## 2019-07-16 DIAGNOSIS — F17210 Nicotine dependence, cigarettes, uncomplicated: Secondary | ICD-10-CM | POA: Insufficient documentation

## 2019-07-16 DIAGNOSIS — E7800 Pure hypercholesterolemia, unspecified: Secondary | ICD-10-CM | POA: Diagnosis present

## 2019-07-16 DIAGNOSIS — I10 Essential (primary) hypertension: Secondary | ICD-10-CM | POA: Diagnosis present

## 2019-07-16 DIAGNOSIS — E785 Hyperlipidemia, unspecified: Secondary | ICD-10-CM | POA: Diagnosis not present

## 2019-07-16 DIAGNOSIS — E119 Type 2 diabetes mellitus without complications: Secondary | ICD-10-CM

## 2019-07-16 DIAGNOSIS — E11621 Type 2 diabetes mellitus with foot ulcer: Secondary | ICD-10-CM | POA: Diagnosis not present

## 2019-07-16 DIAGNOSIS — I129 Hypertensive chronic kidney disease with stage 1 through stage 4 chronic kidney disease, or unspecified chronic kidney disease: Secondary | ICD-10-CM | POA: Diagnosis not present

## 2019-07-16 DIAGNOSIS — L97519 Non-pressure chronic ulcer of other part of right foot with unspecified severity: Secondary | ICD-10-CM | POA: Insufficient documentation

## 2019-07-16 DIAGNOSIS — F17200 Nicotine dependence, unspecified, uncomplicated: Secondary | ICD-10-CM | POA: Diagnosis present

## 2019-07-16 DIAGNOSIS — E1151 Type 2 diabetes mellitus with diabetic peripheral angiopathy without gangrene: Secondary | ICD-10-CM | POA: Insufficient documentation

## 2019-07-16 DIAGNOSIS — I70211 Atherosclerosis of native arteries of extremities with intermittent claudication, right leg: Secondary | ICD-10-CM

## 2019-07-16 DIAGNOSIS — I70229 Atherosclerosis of native arteries of extremities with rest pain, unspecified extremity: Secondary | ICD-10-CM | POA: Diagnosis present

## 2019-07-16 HISTORY — PX: ABDOMINAL AORTOGRAM W/LOWER EXTREMITY: CATH118223

## 2019-07-16 HISTORY — PX: PERIPHERAL VASCULAR INTERVENTION: CATH118257

## 2019-07-16 LAB — GLUCOSE, CAPILLARY: Glucose-Capillary: 113 mg/dL — ABNORMAL HIGH (ref 70–99)

## 2019-07-16 LAB — POCT ACTIVATED CLOTTING TIME
Activated Clotting Time: 406 seconds
Activated Clotting Time: 268 seconds

## 2019-07-16 SURGERY — ABDOMINAL AORTOGRAM W/LOWER EXTREMITY
Anesthesia: LOCAL | Laterality: Right

## 2019-07-16 MED ORDER — METHYLPREDNISOLONE SODIUM SUCC 125 MG IJ SOLR
INTRAMUSCULAR | Status: AC
  Filled 2019-07-16: qty 2

## 2019-07-16 MED ORDER — HEPARIN (PORCINE) IN NACL 1000-0.9 UT/500ML-% IV SOLN
INTRAVENOUS | Status: DC | PRN
  Administered 2019-07-16 (×2): 500 mL

## 2019-07-16 MED ORDER — ACETAMINOPHEN 325 MG PO TABS: 650.0000 mg | ORAL_TABLET | ORAL | Status: DC | PRN

## 2019-07-16 MED ORDER — CLOPIDOGREL BISULFATE 300 MG PO TABS
ORAL_TABLET | ORAL | Status: AC
  Filled 2019-07-16: qty 1

## 2019-07-16 MED ORDER — SODIUM CHLORIDE 0.9% FLUSH: 3.0000 mL | INTRAVENOUS | Status: DC | PRN

## 2019-07-16 MED ORDER — CLOPIDOGREL BISULFATE 300 MG PO TABS
ORAL_TABLET | ORAL | Status: DC | PRN
  Administered 2019-07-16: 300 mg via ORAL

## 2019-07-16 MED ORDER — SODIUM CHLORIDE 0.9% FLUSH
3.0000 mL | INTRAVENOUS | Status: DC | PRN
  Administered 2019-07-16: 3 mL via INTRAVENOUS

## 2019-07-16 MED ORDER — FENTANYL CITRATE (PF) 100 MCG/2ML IJ SOLN
INTRAMUSCULAR | Status: AC
  Filled 2019-07-16: qty 2

## 2019-07-16 MED ORDER — ONDANSETRON HCL 4 MG/2ML IJ SOLN: 4.0000 mg | Freq: Four times a day (QID) | INTRAMUSCULAR | Status: DC | PRN

## 2019-07-16 MED ORDER — ATORVASTATIN CALCIUM 80 MG PO TABS
80.0000 mg | ORAL_TABLET | Freq: Every day | ORAL | Status: DC
  Administered 2019-07-17: 80 mg via ORAL
  Filled 2019-07-16: qty 1

## 2019-07-16 MED ORDER — DIPHENHYDRAMINE HCL 50 MG/ML IJ SOLN
INTRAMUSCULAR | Status: AC
  Filled 2019-07-16: qty 1

## 2019-07-16 MED ORDER — SODIUM CHLORIDE 0.9 % IV SOLN: 250.0000 mL | INTRAVENOUS | Status: DC | PRN

## 2019-07-16 MED ORDER — LISINOPRIL 20 MG PO TABS
20.0000 mg | ORAL_TABLET | Freq: Every day | ORAL | Status: DC
  Administered 2019-07-17: 20 mg via ORAL
  Filled 2019-07-16: qty 1

## 2019-07-16 MED ORDER — FENTANYL CITRATE (PF) 100 MCG/2ML IJ SOLN
INTRAMUSCULAR | Status: DC | PRN
  Administered 2019-07-16 (×2): 25 ug via INTRAVENOUS

## 2019-07-16 MED ORDER — NITROGLYCERIN 1 MG/10 ML FOR IR/CATH LAB
INTRA_ARTERIAL | Status: DC | PRN
  Administered 2019-07-16: 200 ug via INTRA_ARTERIAL

## 2019-07-16 MED ORDER — HEPARIN (PORCINE) IN NACL 1000-0.9 UT/500ML-% IV SOLN
INTRAVENOUS | Status: AC
  Filled 2019-07-16: qty 1000

## 2019-07-16 MED ORDER — ATORVASTATIN CALCIUM 10 MG PO TABS: 20.0000 mg | ORAL_TABLET | Freq: Every day | ORAL | Status: DC

## 2019-07-16 MED ORDER — HEPARIN SODIUM (PORCINE) 1000 UNIT/ML IJ SOLN
INTRAMUSCULAR | Status: DC | PRN
  Administered 2019-07-16: 7000 [IU] via INTRAVENOUS

## 2019-07-16 MED ORDER — IODIXANOL 320 MG/ML IV SOLN
INTRAVENOUS | Status: DC | PRN
  Administered 2019-07-16: 280 mL

## 2019-07-16 MED ORDER — SODIUM CHLORIDE 0.9 % WEIGHT BASED INFUSION: 3.0000 mL/kg/h | INTRAVENOUS | Status: DC

## 2019-07-16 MED ORDER — LISINOPRIL-HYDROCHLOROTHIAZIDE 20-25 MG PO TABS: 1.0000 | ORAL_TABLET | Freq: Every day | ORAL | Status: DC

## 2019-07-16 MED ORDER — SODIUM CHLORIDE 0.9 % IV SOLN: INTRAVENOUS | Status: AC

## 2019-07-16 MED ORDER — NITROGLYCERIN 1 MG/10 ML FOR IR/CATH LAB
INTRA_ARTERIAL | Status: AC
  Filled 2019-07-16: qty 10

## 2019-07-16 MED ORDER — MORPHINE SULFATE (PF) 2 MG/ML IV SOLN: 2.0000 mg | INTRAVENOUS | Status: DC | PRN

## 2019-07-16 MED ORDER — SODIUM CHLORIDE 0.9% FLUSH
3.0000 mL | Freq: Two times a day (BID) | INTRAVENOUS | Status: DC
  Administered 2019-07-17: 3 mL via INTRAVENOUS

## 2019-07-16 MED ORDER — LIDOCAINE HCL (PF) 1 % IJ SOLN
INTRAMUSCULAR | Status: AC
  Filled 2019-07-16: qty 30

## 2019-07-16 MED ORDER — MIDAZOLAM HCL 2 MG/2ML IJ SOLN
INTRAMUSCULAR | Status: AC
  Filled 2019-07-16: qty 2

## 2019-07-16 MED ORDER — METHYLPREDNISOLONE SODIUM SUCC 125 MG IJ SOLR
INTRAMUSCULAR | Status: DC | PRN
  Administered 2019-07-16: 60 mg via INTRAVENOUS

## 2019-07-16 MED ORDER — MIDAZOLAM HCL 2 MG/2ML IJ SOLN
INTRAMUSCULAR | Status: DC | PRN
  Administered 2019-07-16 (×2): 1 mg via INTRAVENOUS

## 2019-07-16 MED ORDER — LABETALOL HCL 5 MG/ML IV SOLN
10.0000 mg | INTRAVENOUS | Status: DC | PRN
  Administered 2019-07-16: 10 mg via INTRAVENOUS

## 2019-07-16 MED ORDER — HYDRALAZINE HCL 20 MG/ML IJ SOLN: 5.0000 mg | INTRAMUSCULAR | Status: DC | PRN

## 2019-07-16 MED ORDER — LIDOCAINE HCL (PF) 1 % IJ SOLN
INTRAMUSCULAR | Status: DC | PRN
  Administered 2019-07-16: 30 mL via INTRADERMAL

## 2019-07-16 MED ORDER — HEPARIN SODIUM (PORCINE) 1000 UNIT/ML IJ SOLN
INTRAMUSCULAR | Status: AC
  Filled 2019-07-16: qty 1

## 2019-07-16 MED ORDER — DIPHENHYDRAMINE HCL 50 MG/ML IJ SOLN
INTRAMUSCULAR | Status: DC | PRN
  Administered 2019-07-16: 25 mg via INTRAVENOUS

## 2019-07-16 MED ORDER — LABETALOL HCL 5 MG/ML IV SOLN
INTRAVENOUS | Status: AC
  Filled 2019-07-16: qty 4

## 2019-07-16 MED ORDER — ASPIRIN EC 81 MG PO TBEC
81.0000 mg | DELAYED_RELEASE_TABLET | Freq: Every day | ORAL | Status: DC
  Administered 2019-07-17: 81 mg via ORAL
  Filled 2019-07-16: qty 1

## 2019-07-16 MED ORDER — CLOPIDOGREL BISULFATE 75 MG PO TABS
75.0000 mg | ORAL_TABLET | Freq: Every day | ORAL | Status: DC
  Administered 2019-07-17: 75 mg via ORAL
  Filled 2019-07-16: qty 1

## 2019-07-16 MED ORDER — HYDROCHLOROTHIAZIDE 25 MG PO TABS
25.0000 mg | ORAL_TABLET | Freq: Every day | ORAL | Status: DC
  Administered 2019-07-17: 25 mg via ORAL
  Filled 2019-07-16: qty 1

## 2019-07-16 MED ORDER — ASPIRIN 81 MG PO CHEW
81.0000 mg | CHEWABLE_TABLET | ORAL | Status: AC
  Administered 2019-07-16: 81 mg via ORAL
  Filled 2019-07-16: qty 1

## 2019-07-16 MED ORDER — SODIUM CHLORIDE 0.9 % WEIGHT BASED INFUSION: 1.0000 mL/kg/h | INTRAVENOUS | Status: DC

## 2019-07-16 SURGICAL SUPPLY — 30 items
BAG SNAP BAND KOVER 36X36 (MISCELLANEOUS) ×1 IMPLANT
BALLN IN.PACT DCB 4X150 (BALLOONS) ×6
BALLN STERLING OTW 2X150X150 (BALLOONS) ×3
BALLOON STERLING OTW 2X150X150 (BALLOONS) IMPLANT
CATH ANGIO 5F PIGTAIL 65CM (CATHETERS) ×1 IMPLANT
CATH CROSS OVER TEMPO 5F (CATHETERS) ×1 IMPLANT
CATH HAWKONE LX EXTENDED TIP (CATHETERS) ×1 IMPLANT
CATH VIANCE CROSS STAND 150CM (MICROCATHETER) ×3
CATH VIANCE CROSS STD 150CM (MICROCATHETER) IMPLANT
DCB IN.PACT 4X150 (BALLOONS) IMPLANT
DEVICE CLOSURE PERCLS PRGLD 6F (VASCULAR PRODUCTS) IMPLANT
DEVICE SPIDERFX EMB PROT 5MM (WIRE) ×1 IMPLANT
DEVICE TORQUE .014-.018 (MISCELLANEOUS) IMPLANT
GUIDEWIRE ZILIENT 6G 014 (WIRE) ×1 IMPLANT
KIT ENCORE 26 ADVANTAGE (KITS) ×1 IMPLANT
KIT PV (KITS) ×3 IMPLANT
PERCLOSE PROGLIDE 6F (VASCULAR PRODUCTS) ×3
SHEATH HIGHFLEX ANSEL 7FR 55CM (SHEATH) ×1 IMPLANT
SHEATH PINNACLE 5F 10CM (SHEATH) ×1 IMPLANT
SHEATH PINNACLE MP 7F 45CM (SHEATH) ×1 IMPLANT
SHEATH PROBE COVER 6X72 (BAG) ×1 IMPLANT
STOPCOCK MORSE 400PSI 3WAY (MISCELLANEOUS) ×1 IMPLANT
SYR MEDRAD MARK 7 150ML (SYRINGE) ×3 IMPLANT
TAPE VIPERTRACK RADIOPAQ (MISCELLANEOUS) IMPLANT
TAPE VIPERTRACK RADIOPAQUE (MISCELLANEOUS) ×3
TORQUE DEVICE .014-.018 (MISCELLANEOUS) ×3
TRANSDUCER W/STOPCOCK (MISCELLANEOUS) ×3 IMPLANT
TRAY PV CATH (CUSTOM PROCEDURE TRAY) ×3 IMPLANT
TUBING CIL FLEX 10 FLL-RA (TUBING) ×1 IMPLANT
WIRE HI TORQ VERSACORE-J 145CM (WIRE) ×1 IMPLANT

## 2019-07-16 NOTE — Interval H&P Note (Signed)
History and Physical Interval Note:  07/16/2019 10:29 AM  Hall Busing [employer].  has presented today for surgery, with the diagnosis of critical limb ischemia.  The various methods of treatment have been discussed with the patient and family. After consideration of risks, benefits and other options for treatment, the patient has consented to  Procedure(s): ABDOMINAL AORTOGRAM W/LOWER EXTREMITY (Left) as a surgical intervention.  The patient's history has been reviewed, patient examined, no change in status, stable for surgery.  I have reviewed the patient's chart and labs.  Questions were answered to the patient's satisfaction.     Quay Burow

## 2019-07-16 NOTE — Progress Notes (Signed)
Rennis Harding RN notified and she will notify Dr Gwenlyn Found

## 2019-07-17 ENCOUNTER — Encounter (HOSPITAL_COMMUNITY): Payer: Self-pay | Admitting: Cardiovascular Disease

## 2019-07-17 ENCOUNTER — Telehealth: Payer: Self-pay

## 2019-07-17 DIAGNOSIS — I998 Other disorder of circulatory system: Secondary | ICD-10-CM

## 2019-07-17 DIAGNOSIS — E1151 Type 2 diabetes mellitus with diabetic peripheral angiopathy without gangrene: Secondary | ICD-10-CM | POA: Diagnosis not present

## 2019-07-17 LAB — BASIC METABOLIC PANEL
Anion gap: 10 (ref 5–15)
BUN: 18 mg/dL (ref 6–20)
CO2: 20 mmol/L — ABNORMAL LOW (ref 22–32)
Calcium: 8.9 mg/dL (ref 8.9–10.3)
Chloride: 107 mmol/L (ref 98–111)
Creatinine, Ser: 1.01 mg/dL (ref 0.61–1.24)
GFR calc Af Amer: >60 mL/min (ref >60)
GFR calc non Af Amer: >60 mL/min (ref >60)
Glucose, Bld: 112 mg/dL — ABNORMAL HIGH (ref 70–99)
Potassium: 4.1 mmol/L (ref 3.5–5.1)
Sodium: 137 mmol/L (ref 135–145)

## 2019-07-17 LAB — CBC
HCT: 38.3 % — ABNORMAL LOW (ref 39.0–52.0)
Hemoglobin: 12.5 g/dL — ABNORMAL LOW (ref 13.0–17.0)
MCH: 29.7 pg (ref 26.0–34.0)
MCHC: 32.6 g/dL (ref 30.0–36.0)
MCV: 91 fL (ref 80.0–100.0)
Platelets: 198 10*3/uL (ref 150–400)
RBC: 4.21 MIL/uL — ABNORMAL LOW (ref 4.22–5.81)
RDW: 14.7 % (ref 11.5–15.5)
WBC: 6.9 10*3/uL (ref 4.0–10.5)
nRBC: 0 % (ref 0.0–0.2)

## 2019-07-17 MED ORDER — ATORVASTATIN CALCIUM 80 MG PO TABS: 80.0000 mg | ORAL_TABLET | Freq: Every day | ORAL | 4 refills | Status: DC

## 2019-07-17 MED ORDER — CLOPIDOGREL BISULFATE 75 MG PO TABS: 75.0000 mg | ORAL_TABLET | Freq: Every day | ORAL | 3 refills | Status: DC

## 2019-07-17 MED ORDER — ASPIRIN 81 MG PO TBEC: 81.0000 mg | DELAYED_RELEASE_TABLET | Freq: Every day | ORAL | 3 refills | Status: DC

## 2019-07-17 MED FILL — CLOPIDOGREL 75 MG TABLET: 75 | 90 days supply | Qty: 90 | Fill #0

## 2019-07-17 MED FILL — ATORVASTATIN CALCIUM 80 MG: 80 | 60 days supply | Qty: 60 | Fill #0

## 2019-07-17 MED FILL — ASPIRIN LOW DOSE 81 MG TBEC: 81 | 90 days supply | Qty: 90 | Fill #0

## 2019-07-17 NOTE — Discharge Instructions (Signed)
Information about your medication: Plavix (anti-platelet agent)  Generic Name (Brand): clopidogrel (Plavix), once daily medication  PURPOSE: You are taking this medication along with aspirin to lower your chance of having a heart attack, stroke, or blood clots in your stent. These can be fatal. Plavix and aspirin help prevent platelets from sticking together and forming a clot that can block an artery or your stent.   Common SIDE EFFECTS you may experience include: bruising or bleeding more easily, shortness of breath  Do not stop taking PLAVIX without talking to the doctor who prescribes it for you. People who are treated with a stent and stop taking Plavix too soon, have a higher risk of getting a blood clot in the stent, having a heart attack, or dying. If you stop Plavix because of bleeding, or for other reasons, your risk of a heart attack or stroke may increase.   Tell all of your doctors and dentists that you are taking Plavix. They should talk to the doctor who prescribed plavix for you before you have any surgery or invasive procedure.   Contact your health care provider if you experience: severe or uncontrollable bleeding, pink/red/brown urine, vomiting blood or vomit that looks like "coffee grounds", red or black stools (looks like tar), coughing up blood or blood clots ----------------------------------------------------------------------------------------------------------------------

## 2019-07-17 NOTE — Progress Notes (Addendum)
Progress Note  Patient Name: [name]. Date of Encounter: 07/17/2019  [facility] Cardiologist: Evalina Field, MD   Subjective   Patient underwent successful atherectomy of long right SFA CTO yesterday. Cath site left groin is stable.   Inpatient Medications    Scheduled Meds: . aspirin EC  81 mg Oral Daily  . atorvastatin  80 mg Oral Daily  . clopidogrel  75 mg Oral Q breakfast  . lisinopril  20 mg Oral Daily   And  . hydrochlorothiazide  25 mg Oral Daily  . sodium chloride flush  3 mL Intravenous Q12H   Continuous Infusions: . sodium chloride     PRN Meds: sodium chloride, acetaminophen, hydrALAZINE, labetalol, morphine injection, ondansetron (ZOFRAN) IV, sodium chloride flush   Vital Signs    Vitals:   07/16/19 1930 07/16/19 1955 07/16/19 2325 07/17/19 0355  BP:  (!) 146/85 (!) 144/86 115/77  Pulse: 86 82 65 67  Resp: 10 13    Temp:  98.4 F (36.9 C) 98.2 F (36.8 C) 98.1 F (36.7 C)  TempSrc:  Oral Oral Oral  SpO2: 97% 99% 97% 99%  Weight:    49.8 kg  Height:        Intake/Output Summary (Last 24 hours) at 07/17/2019 0858 Last data filed at 07/17/2019 0144 Gross per 24 hour  Intake 555.88 ml  Output 620 ml  Net -64.12 ml   Last 3 Weights 07/17/2019 07/16/2019 06/19/2019  Weight (lbs) 109 lb 13.7 oz 112 lb 7 oz 113 lb 3.2 oz  Weight (kg) 49.831 kg 51 kg 51.347 kg      Telemetry    NSR HR 60-70s - Personally Reviewed  ECG    No new- Personally Reviewed  Physical Exam   GEN: No acute distress.   Neck: No JVD Cardiac: RRR, no murmurs, rubs, or gallops.  Respiratory: Clear to auscultation bilaterally. GI: Soft, nontender, non-distended  MS: No edema; No deformity Neuro:  Nonfocal  Psych: Normal affect   Labs    High Sensitivity Troponin:  No results for input(s): TROPONINIHS in the last 720 hours.    Chemistry Recent Labs  Lab 07/10/19 1447 07/17/19 0442  NA 143 137  K 4.7 4.1  CL 104 107  CO2 26 20*  GLUCOSE 84  112*  BUN 16 18  CREATININE 1.19 1.01  CALCIUM 9.5 8.9  GFRNONAA 66 >60  GFRAA 77 >60  ANIONGAP  --  10     Hematology Recent Labs  Lab 07/10/19 1447 07/17/19 0442  WBC 5.9 6.9  RBC 4.83 4.21*  HGB 14.1 12.5*  HCT 43.7 38.3*  MCV 91 91.0  MCH 29.2 29.7  MCHC 32.3 32.6  RDW 13.6 14.7  PLT 216 198    BNPNo results for input(s): BNP, PROBNP in the last 168 hours.   DDimer No results for input(s): DDIMER in the last 168 hours.   Radiology    PERIPHERAL VASCULAR CATHETERIZATION  Result Date: 07/16/2019  [id number] LOCATION:  FACILITY: [facility] PHYSICIAN: Quay Burow, M.D. 31-Aug-1960 DATE OF PROCEDURE:  07/16/2019 DATE OF DISCHARGE: PV Angiogram/Intervention History obtained from chart review.Amir [employer]. is a 59 y.o. thin and chronically ill-appearing separated African-American male with no children who does not work because of being disabled.  Is referred by Dr. Audie Box to me for evaluation treatment of critical limb ischemia.  The patient was initially referred to our practice by Dr. Daylene Katayama, podiatry.  His risk factors include 20 to 30 pack  years of tobacco use having smoked for last 45 years, treated hypertension and hyperlipidemia.  He is never had a heart attack or stroke.  He denies chest pain or shortness of breath.  He apparently had 3 cortisone shots in his right foot 4 to 5 months ago which resulted in wounds at the injection sites which have not healed.  Also complains of claudication bilaterally.  Recent Dopplers performed today revealed a right ABI of 0.60 and a left of 0.45 with occluded SFAs bilaterally.  He does have CKD 3 with serum creatinine in the mid 1 range. Pre Procedure Diagnosis: Critical limb ischemia Post Procedure Diagnosis: Critical limb ischemia Operators: Dr. Quay Burow Procedures Performed:  1.  Ultrasound-guided left common femoral access  2.  Abdominal aortogram/bilateral iliac angiogram/bifemoral runoff  3.  Contralateral access (secondary  catheter placement)  4.  Placement of a spider distal protection device in the right below the knee popliteal artery  5.  Hawk 1 directional atherectomy right SFA  6.  DCB right SFA  7.  Perclose left common femoral artery PROCEDURE DESCRIPTION: The patient was brought to the second floor [facility] cath lab in the the postabsorptive state. He was premedicated with IV Versed and fentanyl.Marland Kitchen  He did complain of some itching and therefore received 25 mg of Benadryl IV and 60 mg of Solu-Medrol. His left groin was prepped and shaved in usual sterile fashion. Xylocaine 1% was used for local anesthesia. A 5 French sheath was inserted into the left common femoral artery using standard Seldinger technique.  Ultrasound was used to identify the left common femoral artery and obtain ultrasound-guided access.  A digital image of this was saved and placed the patient's chart.  A 5 French pigtail catheters placed the distal abdominal aorta.  Distal abdominal aortography, bilateral iliac angiogram echography with bifemoral runoff was performed using bolus chase, digital subtraction and step table technique.  Isovue dye was used for the entirety of the case.  Retrograde aortic pressures monitored in the case.  Angiographic Data: 1: Abdominal aortogram-there was a 75% left renal artery stenosis.  The the infrarenal abdominal aorta was mildly atherosclerotic 2: Right lower extremity-80% segmental ostial/proximal right SFA followed by a long segment CTO with reconstitution in the adductor canal.  There is one-vessel runoff via the peroneal. 3: Left lower extremity-long segment left SFA CTO beginning after the origin and reconstituting in the adductor canal by which reconstituted by profunda femoris collaterals.  There is one-vessel runoff via the anterior tibial   Mr. Hack has critical limb ischemia on the right with a long segment right SFA CTO and one-vessel runoff.  We will proceed with attempt at right SFA endovascular  recanalization for limb salvage. Procedure Description: The patient received a total of 11,000 as of heparin with an ACT in the 400 range.  Isovue dye was used for the entirety intervention (280 cc).  Retrograde aortic pressures monitored during the case. I think contralateral arc access with a crossover catheter, versa core wire and a 7 [place] destination sheath.  There was damping of the waveform probably related to small iliac arteries with diffuse though not physically obstructive disease. I was able to cross the CTO with a Viance CTO catheter along with a 0.14 6 gm Zilient wire.  I then performed PTA with a 2 mm x 150 mm Sterling balloon.  I placed a 5 mm spider distal protection device and the below the knee popliteal artery.  I then performed directional atherectomy  with a Hawk 1 LX device of the entire right SFA performing multiple circumferential cuts removing a copious amount of atherosclerotic plaque.  I performed drug-coated balloon angioplasty with a 4 mm x 150 mm long impact Admiral balloon x2 from the origin of the vessel down to the adductor canal resulting reduction of a long CTO to 0% residual.  There was some decrease in flow and lumen dimension in the peroneal artery potentially related to the spider device although PTA was not performed in this. The sheath was then withdrawn across the bifurcation and a Perclose hemostasis device was successfully deployed.  The patient received 300 mg of p.o. Plavix at the end of the case. Final Impression: Successful Hawk 1 directional atherectomy of a long right SFA CTO from the origin down to the adductor canal using spider distal protection and drug-coated balloon angioplasty with one-vessel runoff via the peroneal.  There was some potential luminal damage of the peroneal with the spider distal protection device.  I elected not to go back after this because I did not want to go back through the previously treated CTO.  We will follow this by duplex  ultrasound.  If Doppler show this to have low flow I may like to go back and reintervene.  The patient left lab in stable condition. Quay Burow. MD, [facility] 07/16/2019 12:37 PM    Cardiac Studies   Peripheral catheterization 07/16/19 Angiographic Data:   1: Abdominal aortogram-there was a 75% left renal artery stenosis.  The the infrarenal abdominal aorta was mildly atherosclerotic 2: Right lower extremity-80% segmental ostial/proximal right SFA followed by a long segment CTO with reconstitution in the adductor canal.  There is one-vessel runoff via the peroneal. 3: Left lower extremity-long segment left SFA CTO beginning after the origin and reconstituting in the adductor canal by which reconstituted by profunda femoris collaterals.  There is one-vessel runoff via the anterior tibial  IMPRESSION: Mr. Koopmann has critical limb ischemia on the right with a long segment right SFA CTO and one-vessel runoff.  We will proceed with attempt at right SFA endovascular recanalization for limb salvage.  Final Impression: Successful Hawk 1 directional atherectomy of a long right SFA CTO from the origin down to the adductor canal using spider distal protection and drug-coated balloon angioplasty with one-vessel runoff via the peroneal.  There was some potential luminal damage of the peroneal with the spider distal protection device.  I elected not to go back after this because I did not want to go back through the previously treated CTO.  We will follow this by duplex ultrasound.  If Doppler show this to have low flow I may like to go back and reintervene.  The patient left lab in stable condition.    Patient Profile     59 y.o. male with pmh of PAD with nonhealing wounds, tobacco use, HTN, HLD, CKD stage 3 who was admitted for peripheral angiography.   Assessment & Plan    PAD/critical limb ischemia - History of nonhealing wounds on right foot and limiting claudication - Right ABI 0.60 and left  0.45 - Peripheral angiography showed 75 % L renl artery stenosis. Right lower extremity 80% segmental ostial/prox right SFA followed by long segment CTO with reconitution in the adductor canal and one-vessel runoff via peroneal. LLE showed long segment left SFA beginning after the origin and reconstituting int he adductor canal by which reconstituted by profunda femoris collaterals. He underwent successful directional atherectomy of long right SFA CTO.  -  He got plavix 300 mg yesterday - creatinine stable at 1.01, Hgb 12.5 - Aspirin, 82 mg daily, Plavix 75 mg daily, statin - patient has follow-up with Dr. Gwenlyn Found  HTN - lisinopril 20 mg and HCTZ 25mg  daily  For questions or updates, please contact [place] Please consult [email or web address] for contact info under        Signed, Cadence Ninfa Meeker, PA-C  07/17/2019, 8:58 AM    I have seen and examined the patient along with Cadence Ninfa Meeker, PA-C .  I have reviewed the chart, notes and new data.  I agree with PA/NP's note.  Key new complaints: feels well, no complaints Key examination changes: no ecchymosis/hematoma or bleeding L groin access site Key new findings / data: creatinine 1.01, Hgb 12.5  PLAN: DC home. Has general Cardiology f/u w Dr. Audie Box tomorrow. F/U w Dr. Gwenlyn Found 1 months.  Sanda Klein, MD, [place] [phone] 07/17/2019, 10:24 AM

## 2019-07-17 NOTE — Plan of Care (Signed)
Problem: Education: Goal: Knowledge of General Education information will improve Description: Including pain rating scale, medication(s)/side effects and non-pharmacologic comfort measures 07/17/2019 1211 by Threasa Beards, RN Outcome: Adequate for Discharge 07/17/2019 0858 by Threasa Beards, RN Outcome: Progressing   Problem: Health Behavior/Discharge Planning: Goal: Ability to manage health-related needs will improve 07/17/2019 1211 by Threasa Beards, RN Outcome: Adequate for Discharge 07/17/2019 0858 by Threasa Beards, RN Outcome: Progressing   Problem: Clinical Measurements: Goal: Ability to maintain clinical measurements within normal limits will improve 07/17/2019 1211 by Threasa Beards, RN Outcome: Adequate for Discharge 07/17/2019 0858 by Threasa Beards, RN Outcome: Progressing Goal: Will remain free from infection 07/17/2019 1211 by Threasa Beards, RN Outcome: Adequate for Discharge 07/17/2019 0858 by Threasa Beards, RN Outcome: Progressing Goal: Diagnostic test results will improve 07/17/2019 1211 by Threasa Beards, RN Outcome: Adequate for Discharge 07/17/2019 0858 by Threasa Beards, RN Outcome: Progressing Goal: Respiratory complications will improve 07/17/2019 1211 by Threasa Beards, RN Outcome: Adequate for Discharge 07/17/2019 0858 by Threasa Beards, RN Outcome: Progressing Goal: Cardiovascular complication will be avoided 07/17/2019 1211 by Threasa Beards, RN Outcome: Adequate for Discharge 07/17/2019 0858 by Threasa Beards, RN Outcome: Progressing   Problem: Activity: Goal: Risk for activity intolerance will decrease 07/17/2019 1211 by Threasa Beards, RN Outcome: Adequate for Discharge 07/17/2019 0858 by Threasa Beards, RN Outcome: Progressing   Problem: Nutrition: Goal: Adequate nutrition will be maintained 07/17/2019 1211 by Threasa Beards, RN Outcome: Adequate for Discharge 07/17/2019 0858 by Threasa Beards, RN Outcome: Progressing   Problem: Coping: Goal: Level of anxiety will decrease 07/17/2019 1211 by Threasa Beards, RN Outcome: Adequate for Discharge 07/17/2019 0858 by Threasa Beards, RN Outcome: Progressing   Problem: Elimination: Goal: Will not experience complications related to bowel motility 07/17/2019 1211 by Threasa Beards, RN Outcome: Adequate for Discharge 07/17/2019 0858 by Threasa Beards, RN Outcome: Progressing Goal: Will not experience complications related to urinary retention 07/17/2019 1211 by Threasa Beards, RN Outcome: Adequate for Discharge 07/17/2019 0858 by Threasa Beards, RN Outcome: Progressing   Problem: Pain Managment: Goal: General experience of comfort will improve 07/17/2019 1211 by Threasa Beards, RN Outcome: Adequate for Discharge 07/17/2019 0858 by Threasa Beards, RN Outcome: Progressing   Problem: Safety: Goal: Ability to remain free from injury will improve 07/17/2019 1211 by Threasa Beards, RN Outcome: Adequate for Discharge 07/17/2019 0858 by Threasa Beards, RN Outcome: Progressing   Problem: Skin Integrity: Goal: Risk for impaired skin integrity will decrease 07/17/2019 1211 by Threasa Beards, RN Outcome: Adequate for Discharge 07/17/2019 0858 by Threasa Beards, RN Outcome: Progressing   Problem: Education: Goal: Understanding of CV disease, CV risk reduction, and recovery process will improve 07/17/2019 1211 by Threasa Beards, RN Outcome: Adequate for Discharge 07/17/2019 0858 by Threasa Beards, RN Outcome: Progressing Goal: Individualized Educational Video(s) 07/17/2019 1211 by Threasa Beards, RN Outcome: Adequate for Discharge 07/17/2019 0858 by Threasa Beards, RN Outcome: Progressing   Problem: Activity: Goal: Ability to return to baseline activity level will improve 07/17/2019 1211 by Threasa Beards, RN Outcome: Adequate for Discharge 07/17/2019 0858 by Threasa Beards,  RN Outcome: Progressing   Problem: Cardiovascular: Goal: Ability to achieve and maintain adequate cardiovascular perfusion will improve 07/17/2019 1211 by Threasa Beards, RN Outcome: Adequate for Discharge 07/17/2019 0858 by Threasa Beards, RN Outcome: Progressing Goal: Vascular access  site(s) Level 0-1 will be maintained 07/17/2019 1211 by Threasa Beards, RN Outcome: Adequate for Discharge 07/17/2019 0858 by Threasa Beards, RN Outcome: Progressing   Problem: Health Behavior/Discharge Planning: Goal: Ability to safely manage health-related needs after discharge will improve 07/17/2019 1211 by Threasa Beards, RN Outcome: Adequate for Discharge 07/17/2019 0858 by Threasa Beards, RN Outcome: Progressing

## 2019-07-17 NOTE — Discharge Summary (Signed)
Discharge Summary    Patient ID: [name]. MRN: [id number]; DOB: 12/08/1960  Admit date: 07/16/2019 Discharge date: 07/17/2019  Primary Care Provider: Loretha Brasil, FNP  Primary Cardiologist: Evalina Field, MD  Primary Electrophysiologist:  None   Discharge Diagnoses    Principal Problem:   Critical lower limb ischemia Active Problems:   Essential hypertension, benign   Pure hypercholesterolemia   Type 2 diabetes mellitus without complication, without long-term current use of insulin ([place])   Tobacco use disorder   Critical ischemia of foot    Diagnostic Studies/Procedures    Peripheral Vascular catheterization 07/16/19 Procedures Performed:               1.  Ultrasound-guided left common femoral access               2.  Abdominal aortogram/bilateral iliac angiogram/bifemoral runoff               3.  Contralateral access (secondary catheter placement)               4.  Placement of a spider distal protection device in the right below the knee popliteal artery               5.  Hawk 1 directional atherectomy right SFA               6.  DCB right SFA               7.  Perclose left common femoral artery 1: Abdominal aortogram-there was a 75% left renal artery stenosis.  The the infrarenal abdominal aorta was mildly atherosclerotic 2: Right lower extremity-80% segmental ostial/proximal right SFA followed by a long segment CTO with reconstitution in the adductor canal.  There is one-vessel runoff via the peroneal. 3: Left lower extremity-long segment left SFA CTO beginning after the origin and reconstituting in the adductor canal by which reconstituted by profunda femoris collaterals.  There is one-vessel runoff via the anterior tibial  IMPRESSION: [name] has critical limb ischemia on the right with a long segment right SFA CTO and one-vessel runoff.  We will proceed with attempt at right SFA endovascular recanalization for limb salvage. Final Impression:  Successful Hawk 1 directional atherectomy of a long right SFA CTO from the origin down to the adductor canal using spider distal protection and drug-coated balloon angioplasty with one-vessel runoff via the peroneal.  There was some potential luminal damage of the peroneal with the spider distal protection device.  I elected not to go back after this because I did not want to go back through the previously treated CTO.  We will follow this by duplex ultrasound.  If Doppler show this to have low flow I may like to go back and reintervene.  The patient left lab in stable condition.  _____________   History of Present Illness     [name]. is a 59 y.o. male with  with pmh of PAD with nonhealing wounds, tobacco use, HTN, HLD, CKD stage 3 who was admitted for peripheral angiography for critical limb ischemia. He was referred by Dr. Carmie End from podiatry for non healing wounds for the last 5 months. He had 3 cortisone shots in the right foot which resulted in injection site wounds that did not heal. He was seen by Dr. Audie Box where he also reported bilateral claudication and was referred to Dr. Gwenlyn Found. Dopplers showed right ABI 0.60 and left 0.45 with occluded SFAs  bilaterally and he was set up for peripheral angiography.   Hospital Course     Consultants: None   The patient was brought into the hospital 6/21 for his scheduled procedure. Peripheral angiography showed 75 % L renal artery stenosis; Right lower extremity 80% segmental ostial/prox right SFA followed by long segment CTO with reconstitution in the adductor canal and one-vessel runoff via peroneal; LLE showed long segment left SFA beginning after the origin and reconstituting in the adductor canal by which reconstituted by profunda femoris collaterals with one-vessel runoff via anterior tibial. He underwent successful Hawk 1 directional atherectomy of a lon right SFA CTO using spider distal protection and drug-coated balloon angioplasty  with one-vessel runoff via peroneal. There was possible luminal damage but did not want to enter back into previously treated CTO. Patient tolerated the procedure well. He was given 326m Plavix post procedure. The patient was admitted overnight. Cath site left groin remained stable with no tenderness, bleeding, bruising. Patient was started on Aspirin 81 mg daily, Plavix 75 mg daily and atorvastatin was increased to 80 mg daily. Follow-up labs showed stable creatinine and hemoglobin.  Plan to repeat UKoreaduplex to evaluate flow. Hospital follow-up was made.   The patient was evaluated by Dr. CSallyanne Kusteron 07/17/19 and felt to be stable for discharge.   Did the patient have an acute coronary syndrome (MI, NSTEMI, STEMI, etc) this admission?:  No                               Did the patient have a percutaneous coronary intervention (stent / angioplasty)?:  No.   _____________  Discharge Vitals Blood pressure 115/77, pulse 67, temperature 98.1 F (36.7 C), temperature source Oral, resp. rate 13, height 5' 2" (1.575 m), weight 49.8 kg, SpO2 99 %.  Filed Weights   07/16/19 0628 07/17/19 0355  Weight: 51 kg 49.8 kg    Labs & Radiologic Studies    CBC Recent Labs    07/17/19 0442  WBC 6.9  HGB 12.5*  HCT 38.3*  MCV 91.0  PLT 1678  Basic Metabolic Panel Recent Labs    07/17/19 0442  NA 137  K 4.1  CL 107  CO2 20*  GLUCOSE 112*  BUN 18  CREATININE 1.01  CALCIUM 8.9   Liver Function Tests No results for input(s): AST, ALT, ALKPHOS, BILITOT, PROT, ALBUMIN in the last 72 hours. No results for input(s): LIPASE, AMYLASE in the last 72 hours. High Sensitivity Troponin:   No results for input(s): TROPONINIHS in the last 720 hours.  BNP Invalid input(s): POCBNP D-Dimer No results for input(s): DDIMER in the last 72 hours. Hemoglobin A1C No results for input(s): HGBA1C in the last 72 hours. Fasting Lipid Panel No results for input(s): CHOL, HDL, LDLCALC, TRIG, CHOLHDL, LDLDIRECT in  the last 72 hours. Thyroid Function Tests No results for input(s): TSH, T4TOTAL, T3FREE, THYROIDAB in the last 72 hours.  Invalid input(s): FREET3 _____________  PERIPHERAL VASCULAR CATHETERIZATION  Result Date: 07/16/2019  [id number]:  [facility]: JQuay Burow M.D. [id number] OF PROCEDURE:  07/16/2019 DATE OF DISCHARGE: PV Angiogram/Intervention History obtained from chart review.Harvel J [employer] is a 59y.o. thin and chronically ill-appearing separated African-American male with no children who does not work because of being disabled.  Is referred by Dr. OAudie Boxto me for evaluation treatment of critical limb ischemia.  The patient was initially referred to our practice by  Dr. Daylene Katayama, podiatry.  His risk factors include 20 to 30 pack years of tobacco use having smoked for last 45 years, treated hypertension and hyperlipidemia.  He is never had a heart attack or stroke.  He denies chest pain or shortness of breath.  He apparently had 3 cortisone shots in his right foot 4 to 5 months ago which resulted in wounds at the injection sites which have not healed.  Also complains of claudication bilaterally.  Recent Dopplers performed today revealed a right ABI of 0.60 and a left of 0.45 with occluded SFAs bilaterally.  He does have CKD 3 with serum creatinine in the mid 1 range. Pre Procedure Diagnosis: Critical limb ischemia Post Procedure Diagnosis: Critical limb ischemia Operators: Dr. Quay Burow Procedures Performed:  1.  Ultrasound-guided left common femoral access  2.  Abdominal aortogram/bilateral iliac angiogram/bifemoral runoff  3.  Contralateral access (secondary catheter placement)  4.  Placement of a spider distal protection device in the right below the knee popliteal artery  5.  Hawk 1 directional atherectomy right SFA  6.  DCB right SFA  7.  Perclose left common femoral artery PROCEDURE DESCRIPTION: The patient was brought to the second floor [facility]  Cardiac cath lab in the the postabsorptive state. He was premedicated with IV Versed and fentanyl.Marland Kitchen  He did complain of some itching and therefore received 25 mg of Benadryl IV and 60 mg of Solu-Medrol. His left groin was prepped and shaved in usual sterile fashion. Xylocaine 1% was used for local anesthesia. A 5 French sheath was inserted into the left common femoral artery using standard Seldinger technique.  Ultrasound was used to identify the left common femoral artery and obtain ultrasound-guided access.  A digital image of this was saved and placed the patient's chart.  A 5 French pigtail catheters placed the distal abdominal aorta.  Distal abdominal aortography, bilateral iliac angiogram echography with bifemoral runoff was performed using bolus chase, digital subtraction and step table technique.  Isovue dye was used for the entirety of the case.  Retrograde aortic pressures monitored in the case.  Angiographic Data: 1: Abdominal aortogram-there was a 75% left renal artery stenosis.  The the infrarenal abdominal aorta was mildly atherosclerotic 2: Right lower extremity-80% segmental ostial/proximal right SFA followed by a long segment CTO with reconstitution in the adductor canal.  There is one-vessel runoff via the peroneal. 3: Left lower extremity-long segment left SFA CTO beginning after the origin and reconstituting in the adductor canal by which reconstituted by profunda femoris collaterals.  There is one-vessel runoff via the anterior tibial   [name] has critical limb ischemia on the right with a long segment right SFA CTO and one-vessel runoff.  We will proceed with attempt at right SFA endovascular recanalization for limb salvage. Procedure Description: The patient received a total of 11,000 as of heparin with an ACT in the 400 range.  Isovue dye was used for the entirety intervention (280 cc).  Retrograde aortic pressures monitored during the case. I think contralateral arc access with a  crossover catheter, versa core wire and a 7 [place] destination sheath.  There was damping of the waveform probably related to small iliac arteries with diffuse though not physically obstructive disease. I was able to cross the CTO with a Viance CTO catheter along with a 0.14 6 gm Zilient wire.  I then performed PTA with a 2 mm x 150 mm Sterling balloon.  I placed a 5 mm spider distal protection device  and the below the knee popliteal artery.  I then performed directional atherectomy with a Hawk 1 LX device of the entire right SFA performing multiple circumferential cuts removing a copious amount of atherosclerotic plaque.  I performed drug-coated balloon angioplasty with a 4 mm x 150 mm long impact Admiral balloon x2 from the origin of the vessel down to the adductor canal resulting reduction of a long CTO to 0% residual.  There was some decrease in flow and lumen dimension in the peroneal artery potentially related to the spider device although PTA was not performed in this. The sheath was then withdrawn across the bifurcation and a Perclose hemostasis device was successfully deployed.  The patient received 300 mg of p.o. Plavix at the end of the case. Final Impression: Successful Hawk 1 directional atherectomy of a long right SFA CTO from the origin down to the adductor canal using spider distal protection and drug-coated balloon angioplasty with one-vessel runoff via the peroneal.  There was some potential luminal damage of the peroneal with the spider distal protection device.  I elected not to go back after this because I did not want to go back through the previously treated CTO.  We will follow this by duplex ultrasound.  If Doppler show this to have low flow I may like to go back and reintervene.  The patient left lab in stable condition. Quay Burow. MD, [facility] 07/16/2019 12:37 PM   ECHOCARDIOGRAM COMPLETE  Result Date: 07/12/2019    ECHOCARDIOGRAM REPORT   Patient Name:   [name]. Date of  Exam: 07/12/2019 Medical Rec #:  [id number]             Height:       62.0 in Accession #:    [id number]            Weight:       113.2 lb Date of Birth:  Jun 08, 1960             BSA:          1.501 m Patient Age:    20 years              BP:           142/80 mmHg Patient Gender: M                     HR:           67 bpm. Exam Location:  [place] Procedure: 2D Echo, 3D Echo, Cardiac Doppler, Color Doppler and Strain Analysis Indications:    R94.31 Abnormal EKG  History:        Patient has no prior history of Echocardiogram examinations.                 Risk Factors:Hypertension, Diabetes, Dyslipidemia and Current                 Smoker.  Sonographer:    Cresenciano Lick RDCS Referring Phys: [id number] [place]  1. Left ventricular ejection fraction, by estimation, is 55 to 60%. Left ventricular ejection fraction by 3D volume is 59 %. The left ventricle has normal function. The left ventricle has no regional wall motion abnormalities. Left ventricular diastolic  parameters are indeterminate. The average left ventricular global longitudinal strain is -20.4 %. The global longitudinal strain is normal.  2. Right ventricular systolic function is normal. The right ventricular size is normal. Tricuspid regurgitation signal is inadequate for assessing PA pressure.  3. The mitral valve is grossly normal. Mild mitral valve regurgitation. No evidence of mitral stenosis.  4. The aortic valve is tricuspid. Aortic valve regurgitation is not visualized. No aortic stenosis is present.  5. The inferior vena cava is normal in size with greater than 50% respiratory variability, suggesting right atrial pressure of 3 mmHg. FINDINGS  Left Ventricle: Left ventricular ejection fraction, by estimation, is 55 to 60%. Left ventricular ejection fraction by 3D volume is 59 %. The left ventricle has normal function. The left ventricle has no regional wall motion abnormalities. The average left ventricular global  longitudinal strain is -20.4 %. The global longitudinal strain is normal. The left ventricular internal cavity size was normal in size. There is no left ventricular hypertrophy. Left ventricular diastolic parameters are indeterminate. Right Ventricle: The right ventricular size is normal. No increase in right ventricular wall thickness. Right ventricular systolic function is normal. Tricuspid regurgitation signal is inadequate for assessing PA pressure. Left Atrium: Left atrial size was normal in size. Right Atrium: Right atrial size was normal in size. Pericardium: There is no evidence of pericardial effusion. Mitral Valve: The mitral valve is grossly normal. There is mild thickening of the anterior and posterior mitral valve leaflet(s). Mild mitral valve regurgitation. No evidence of mitral valve stenosis. Tricuspid Valve: The tricuspid valve is grossly normal. Tricuspid valve regurgitation is not demonstrated. No evidence of tricuspid stenosis. Aortic Valve: The aortic valve is tricuspid. Aortic valve regurgitation is not visualized. No aortic stenosis is present. There is mild calcification of the aortic valve. Pulmonic Valve: The pulmonic valve was grossly normal. Pulmonic valve regurgitation is not visualized. No evidence of pulmonic stenosis. Aorta: The aortic root and ascending aorta are structurally normal, with no evidence of dilitation. Venous: The inferior vena cava is normal in size with greater than 50% respiratory variability, suggesting right atrial pressure of 3 mmHg. IAS/Shunts: The atrial septum is grossly normal.  LEFT VENTRICLE PLAX 2D LVIDd:         3.70 cm         Diastology LVIDs:         2.40 cm         LV e' lateral:   7.40 cm/s LV PW:         1.00 cm         LV E/e' lateral: 13.4 LV IVS:        1.00 cm         LV e' medial:    6.31 cm/s LVOT diam:     1.90 cm         LV E/e' medial:  15.7 LV SV:         46 LV SV Index:   31              2D LVOT Area:     2.84 cm        Longitudinal                                 Strain                                2D Strain GLS  -19.9 %                                (  A2C):                                2D Strain GLS  -20.2 %                                (A3C):                                2D Strain GLS  -21.2 %                                (A4C):                                2D Strain GLS  -20.4 %                                Avg:                                 3D Volume EF                                LV 3D EF:    Left                                             ventricular                                             ejection                                             fraction by                                             3D volume                                             is 59 %.                                 3D Volume EF:                                3D EF:        59 %  LV EDV:       79 ml                                LV ESV:       33 ml                                LV SV:        47 ml RIGHT VENTRICLE RV Basal diam:  3.20 cm RV S prime:     12.30 cm/s TAPSE (M-mode): 2.4 cm LEFT ATRIUM             Index       RIGHT ATRIUM          Index LA diam:        3.50 cm 2.33 cm/m  RA Area:     7.62 cm LA Vol (A2C):   56.6 ml 37.71 ml/m RA Volume:   13.00 ml 8.66 ml/m LA Vol (A4C):   28.4 ml 18.92 ml/m LA Biplane Vol: 40.8 ml 27.18 ml/m  AORTIC VALVE LVOT Vmax:   76.30 cm/s LVOT Vmean:  55.000 cm/s LVOT VTI:    0.162 m  AORTA Ao Root diam: 2.70 cm Ao Asc diam:  3.10 cm MITRAL VALVE MV Area (PHT): 3.48 cm     SHUNTS MV Decel Time: 218 msec     Systemic VTI:  0.16 m MR Peak grad: 117.1 mmHg    Systemic Diam: 1.90 cm MR Vmax:      541.00 cm/s MV E velocity: 98.80 cm/s MV A velocity: 101.00 cm/s MV E/A ratio:  0.98 Eleonore Chiquito MD Electronically signed by Eleonore Chiquito MD Signature Date/Time: 07/12/2019/1:00:52 PM    Final    VAS [place] LE ART SEG MULTI (Segm&LE Reynauds)  Result Date: 07/10/2019 LOWER EXTREMITY  DOPPLER STUDY Indications: Claudication, rest pain, ulceration, gangrene, and Patient has been              having claudication and rest pain symptoms for about 4 months. He              notices when he walks the pain will start in his mid thighs and              hurt all the way to feet. Today he presents with 3 non healing              ulcers on the dorsal right foot, both sets of toes are discolored. High Risk Factors: Hypertension, hyperlipidemia, Diabetes, current smoker. Other Factors: SEE BILATERAL LEG ARTERIAL DUPLEX REPORT.  Performing Technologist: Salvadore Dom RVT  Examination Guidelines: A complete evaluation includes at minimum, Doppler waveform signals and systolic blood pressure reading at the level of bilateral brachial, anterior tibial, and posterior tibial arteries, when vessel segments are accessible. Bilateral testing is considered an integral part of a complete examination. Photoelectric Plethysmograph (PPG) waveforms and toe systolic pressure readings are included as required and additional duplex testing as needed. Limited examinations for reoccurring indications may be performed as noted.  ABI Findings: +---------+------------------+-----+-------------------+--------+ Right    Rt Pressure (mmHg)IndexWaveform           Comment  +---------+------------------+-----+-------------------+--------+ Brachial 152                                                +---------+------------------+-----+-------------------+--------+  CFA                             biphasic                    +---------+------------------+-----+-------------------+--------+ Popliteal                       monophasic                  +---------+------------------+-----+-------------------+--------+ ATA                             absent                      +---------+------------------+-----+-------------------+--------+ PTA      94                0.60 dampened monophasic          +---------+------------------+-----+-------------------+--------+ PERO     80                0.51 dampened monophasic         +---------+------------------+-----+-------------------+--------+ Great Toe39                0.25 Abnormal                    +---------+------------------+-----+-------------------+--------+ +---------+------------------+-----+-------------------+-------+ Left     Lt Pressure (mmHg)IndexWaveform           Comment +---------+------------------+-----+-------------------+-------+ Brachial 156                                               +---------+------------------+-----+-------------------+-------+ CFA                             biphasic                   +---------+------------------+-----+-------------------+-------+ Popliteal                       monophasic                 +---------+------------------+-----+-------------------+-------+ ATA      65                0.42 monophasic                 +---------+------------------+-----+-------------------+-------+ PTA      24                0.15 dampened monophasic        +---------+------------------+-----+-------------------+-------+ PERO     70                0.45 dampened monophasic        +---------+------------------+-----+-------------------+-------+ Great Toe0                 0.00 Absent                     +---------+------------------+-----+-------------------+-------+ +-------+-----------+-----------+------------+------------+ ABI/TBIToday's ABIToday's TBIPrevious ABIPrevious TBI +-------+-----------+-----------+------------+------------+ Right  .60        .25                                 +-------+-----------+-----------+------------+------------+  Left   .45        0                                   +-------+-----------+-----------+------------+------------+ TOES Findings: +----------+---------------+--------+-------+ Right ToesPressure  (mmHg)WaveformComment +----------+---------------+--------+-------+ 1st Digit                Abnormal        +----------+---------------+--------+-------+ 2nd Digit                Abnormal        +----------+---------------+--------+-------+ 3rd Digit                Abnormal        +----------+---------------+--------+-------+ 4th Digit                Abnormal        +----------+---------------+--------+-------+ 5th Digit                Abnormal        +----------+---------------+--------+-------+  +---------+---------------+--------+-------+ Left ToesPressure (mmHg)WaveformComment +---------+---------------+--------+-------+ 1st Digit               Absent          +---------+---------------+--------+-------+ 2nd Digit               Abnormal        +---------+---------------+--------+-------+ 3rd Digit               Abnormal        +---------+---------------+--------+-------+ 4th Digit               Absent          +---------+---------------+--------+-------+ 5th Digit               Absent          +---------+---------------+--------+-------+    Summary: Right: Resting right ankle-brachial index indicates moderate right lower extremity arterial disease. The right toe-brachial index is abnormal. Left: Resting left ankle-brachial index indicates severe left lower extremity arterial disease. The left toe-brachial index is abnormal.  *See table(s) above for measurements and observations.  Electronically signed by Ena Dawley MD on 07/10/2019 at 8:26:09 PM.    Final    VAS [place] LOWER EXTREMITY ARTERIAL DUPLEX  Result Date: 07/10/2019 LOWER EXTREMITY ARTERIAL DUPLEX STUDY Indications: Claudication, rest pain, ulceration, gangrene, and Patient has been              having claudication and rest pain symptoms for about 4 months. He              notices when he walks the pain will start in his mid thighs and              hurt all the way to feet. Today he presents  with 3 non healing              ulcers on the dorsal right foot, both sets of toes are discolored. High Risk Factors: Hypertension, hyperlipidemia, Diabetes, current smoker. Other Factors: SEE ABI REPORT.  Current ABI: Right .60 Left .25 Comparison Study: None Performing Technologist: Alecia Mackin RVT, RDCS (AE), RDMS  Examination Guidelines: A complete evaluation includes B-mode imaging, spectral Doppler, color Doppler, and power Doppler as needed of all accessible portions of each vessel. Bilateral testing is considered an integral part of a complete examination. Limited examinations for reoccurring indications may be performed as noted.  +-----------+--------+-----+--------+-------------------------+----------------+ RIGHT  PSV cm/sRatioStenosisWaveform                 Comments         +-----------+--------+-----+--------+-------------------------+----------------+ CFA Prox   148                  triphasic                                 +-----------+--------+-----+--------+-------------------------+----------------+ CFA Distal 105                  monophasic                                +-----------+--------+-----+--------+-------------------------+----------------+ DFA        189                  monophasic                                +-----------+--------+-----+--------+-------------------------+----------------+ SFA Prox   83                   monophasic                                +-----------+--------+-----+--------+-------------------------+----------------+ SFA Mid    14           occluded                         collateral noted +-----------+--------+-----+--------+-------------------------+----------------+ SFA Distal 88                   monophasic               recannulization  +-----------+--------+-----+--------+-------------------------+----------------+ POP Prox   44                   monophasic                                 +-----------+--------+-----+--------+-------------------------+----------------+ POP Distal 38                   monophasic                                +-----------+--------+-----+--------+-------------------------+----------------+ TP Trunk   77                   monophasic                                +-----------+--------+-----+--------+-------------------------+----------------+ ATA Prox   20                   monophasic                                +-----------+--------+-----+--------+-------------------------+----------------+ ATA Mid                 occluded                                          +-----------+--------+-----+--------+-------------------------+----------------+  ATA Distal 13                   monophasic               ? near occlusion +-----------+--------+-----+--------+-------------------------+----------------+ PTA Prox   70                   monophasic                                +-----------+--------+-----+--------+-------------------------+----------------+ PTA Mid    11                   dampened monophasic ?                                                     near occlusion                            +-----------+--------+-----+--------+-------------------------+----------------+ PTA Distal              occluded                                          +-----------+--------+-----+--------+-------------------------+----------------+ PERO Prox  61                   monophasic                                +-----------+--------+-----+--------+-------------------------+----------------+ PERO Mid   35                   monophasic                                +-----------+--------+-----+--------+-------------------------+----------------+ PERO Distal31                   monophasic                                +-----------+--------+-----+--------+-------------------------+----------------+ Right  pedal artery 194 ms = category 3 moderate ischemia.  +-----------+--------+-----+--------+-------------------+----------------+ LEFT       PSV cm/sRatioStenosisWaveform           Comments         +-----------+--------+-----+--------+-------------------+----------------+ CFA Prox   132                  blunted triphasic                   +-----------+--------+-----+--------+-------------------+----------------+ CFA Distal 92                   triphasic                           +-----------+--------+-----+--------+-------------------+----------------+ DFA        103                  monophasic                          +-----------+--------+-----+--------+-------------------+----------------+  SFA Prox   88                   blunted triphasic                   +-----------+--------+-----+--------+-------------------+----------------+ SFA Mid    19           occluded                   collateral noted +-----------+--------+-----+--------+-------------------+----------------+ SFA Distal 124                  monophasic         recannulization  +-----------+--------+-----+--------+-------------------+----------------+ POP Prox   22                   monophasic                          +-----------+--------+-----+--------+-------------------+----------------+ POP Distal 15                   monophasic                          +-----------+--------+-----+--------+-------------------+----------------+ TP Trunk   26                   monophasic                          +-----------+--------+-----+--------+-------------------+----------------+ ATA Prox   55                   monophasic                          +-----------+--------+-----+--------+-------------------+----------------+ ATA Mid    19                   monophasic                          +-----------+--------+-----+--------+-------------------+----------------+ ATA Distal 16                    monophasic                          +-----------+--------+-----+--------+-------------------+----------------+ PTA Prox   18                   monophasic                          +-----------+--------+-----+--------+-------------------+----------------+ PTA Mid                 occluded                                    +-----------+--------+-----+--------+-------------------+----------------+ PTA Distal              occluded                                    +-----------+--------+-----+--------+-------------------+----------------+ PERO Prox  20                   monophasic                          +-----------+--------+-----+--------+-------------------+----------------+  PERO Mid   11                   dampened monophasic                 +-----------+--------+-----+--------+-------------------+----------------+ PERO Distal             occluded                                    +-----------+--------+-----+--------+-------------------+----------------+ Left pedal artery 278m = category 3 moderate ischemia.  Summary: Right: Total occlusion noted in the superficial femoral artery. Total occlusion noted in the anterior tibial artery. Total occlusion noted in the posterior tibial artery. Homogenous plaque seen within arteries with decreasing lumen size. One vessel runoff. Left: Total occlusion noted in the superficial femoral artery. Total occlusion noted in the posterior tibial artery. Total occlusion noted in the peroneal artery. Homogenous plaque seen within arteries with decreasing lumen size. One vessel runoff.  See table(s) above for measurements and observations. Patient seeing Dr. BGwenlyn Foundtoday at 2:00 pm. Vascular consult recommended. Electronically signed by KEna DawleyMD on 07/10/2019 at 8:32:52 PM.    Final    Disposition   Pt is being discharged home today in good condition.  Follow-up Plans & Appointments     Follow-up Information      [facility] Follow up on 07/27/2019.   Specialty: Cardiology Why: Lower extremity Ultra sound 1PM Contact information: [phone]      BLorretta Harp MD Follow up on 07/31/2019.   Specialties: Cardiology, Radiology Why: @ 1:30PM Contact information: [id number] [phone]        OGeralynn Rile MD Follow up on 07/18/2019.   Specialties: Internal Medicine, Cardiology, Radiology Contact information: [place] [phone]               Discharge Medications   Allergies as of 07/17/2019   No Known Allergies     Medication List    STOP taking these medications   meloxicam 15 MG tablet Commonly known as: MOBIC     TAKE these medications   aspirin 81 MG EC tablet Take 1 tablet (81 mg total) by mouth daily. Swallow whole. Start taking on: July 18, 2019   atorvastatin 80 MG tablet Commonly known as: LIPITOR Take 1 tablet (80 mg total) by mouth daily. Start taking on: July 18, 2019 What changed:   medication strength  how much to take  when to take this   Blood Pressure Monitor Kit Check blood pressure daily   Chantix Starting Month Pak 0.5 MG X 11 & 1 MG X 42 tablet Generic drug: varenicline Take one 0.5 mg tablet by mouth once daily for 3 days, then increase to one 0.5 mg tablet twice daily for 4 days, then increase to one 1 mg tablet twice daily.   clopidogrel 75 MG tablet Commonly known as: PLAVIX Take 1 tablet (75 mg total) by mouth daily with breakfast. Start taking on: July 18, 2019   doxycycline 100 MG tablet Commonly known as: VIBRA-TABS Take 100 mg by mouth 2 (two) times daily.   gabapentin 100 MG capsule Commonly known as: NEURONTIN Take 200 mg by mouth 3 (three) times daily.   lisinopril-hydrochlorothiazide 20-25 MG tablet Commonly known as: ZESTORETIC Take 1 tablet by  mouth daily.          Outstanding Labs/Studies   LE arterial US dopplers 7/2  Duration of Discharge Encounter   Greater than 30 minutes including physician time.  Signed, Cadence Ninfa Meeker, PA-C 07/17/2019, 10:39 AM

## 2019-07-17 NOTE — Telephone Encounter (Signed)
Per Dr.O'Neal- I called patient to cancel the appointment with him for tomorrow.   He will follow up with Dr.Berry as scheduled for July.   We can get him rescheduled with Dr.O'Neal in 3 months.   I did not cancel the appointment yet until I heard from patient. Gave call back number to discuss.

## 2019-07-17 NOTE — Plan of Care (Signed)
  Problem: Pain Managment: Goal: General experience of comfort will improve Outcome: Progressing   Problem: Safety: Goal: Ability to remain free from injury will improve Outcome: Progressing   

## 2019-07-18 ENCOUNTER — Encounter: Payer: Self-pay | Admitting: Cardiovascular Disease

## 2019-07-18 ENCOUNTER — Other Ambulatory Visit: Payer: Self-pay

## 2019-07-18 ENCOUNTER — Ambulatory Visit (INDEPENDENT_AMBULATORY_CARE_PROVIDER_SITE_OTHER): Payer: Medicare Other | Admitting: Cardiovascular Disease

## 2019-07-18 ENCOUNTER — Telehealth: Payer: Self-pay

## 2019-07-18 VITALS — BP 136/78 | HR 69 | Temp 96.4°F | Ht 62.0 in | Wt 110.0 lb

## 2019-07-18 DIAGNOSIS — M79604 Pain in right leg: Secondary | ICD-10-CM | POA: Diagnosis not present

## 2019-07-18 DIAGNOSIS — Z720 Tobacco use: Secondary | ICD-10-CM

## 2019-07-18 DIAGNOSIS — I998 Other disorder of circulatory system: Secondary | ICD-10-CM | POA: Diagnosis not present

## 2019-07-18 DIAGNOSIS — I70229 Atherosclerosis of native arteries of extremities with rest pain, unspecified extremity: Secondary | ICD-10-CM

## 2019-07-18 DIAGNOSIS — E782 Mixed hyperlipidemia: Secondary | ICD-10-CM | POA: Diagnosis not present

## 2019-07-18 DIAGNOSIS — I10 Essential (primary) hypertension: Secondary | ICD-10-CM | POA: Diagnosis not present

## 2019-07-18 NOTE — Patient Instructions (Signed)
Medication Instructions:  The current medical regimen is effective;  continue present plan and medications.  *If you need a refill on your cardiac medications before your next appointment, please call your pharmacy*   Lab Work: LIPID when you have blood work drawn and come see Dr.Berry   If you have labs (blood work) drawn today and your tests are completely normal, you will receive your results only by: Marland Kitchen MyChart Message (if you have MyChart) OR . A paper copy in the mail If you have any lab test that is abnormal or we need to change your treatment, we will call you to review the results.   Follow-Up: At [facility], you and your health needs are our priority.  As part of our continuing mission to provide you with exceptional heart care, we have created designated Provider Care Teams.  These Care Teams include your primary Cardiologist (physician) and Advanced Practice Providers (APPs -  Physician Assistants and Nurse Practitioners) who all work together to provide you with the care you need, when you need it.  We recommend signing up for the patient portal called "MyChart".  Sign up information is provided on this After Visit Summary.  MyChart is used to connect with patients for Virtual Visits (Telemedicine).  Patients are able to view lab/test results, encounter notes, upcoming appointments, etc.  Non-urgent messages can be sent to your provider as well.   To learn more about what you can do with MyChart, go to [email or web address].    Your next appointment:   6 month(s)  The format for your next appointment:   In Person  Provider:   Eleonore Chiquito, MD

## 2019-07-18 NOTE — Progress Notes (Signed)
Cardiology Office Note:   Date:  07/18/2019  NAME:  [name].    MRN: [id number] DOB:  October 20, 1960   PCP:  Loretha Brasil, FNP  Cardiologist:  Evalina Field, MD  Electrophysiologist:  None   Referring MD: Loretha Brasil, FNP   Chief Complaint  Patient presents with  . Follow-up    3 months   History of Present Illness:   Ruben Cavazos. is a 59 y.o. male with a hx of PAD, CKD, hypertension, prediabetes who presents for follow-up.  He was evaluated last month for poor wound healing in the right lower extremity.  He was found to have critical limb ischemia in the right lower extremity.  He underwent atherectomy and balloon angioplasty to the right superficial femoral artery.  He reports he is doing well from his procedure.  This was 2 days ago.  I did look at his left femoral cath site and this is clean and dry without evidence of hematoma.  He will continue with precautions as instructed by Dr. Alvester Chou.  His EKG today demonstrates normal sinus rhythm with LVH.  We did go over his recent testing which shows normal LV function.  He denies any chest pain or shortness of breath with exertion.  The main issue is the right lower extremity pain.  This is due to critical limb ischemia.  His most recent LDL cholesterol was 89.  He is on Lipitor 80 mg has been on this for 1 month.  We will need to recheck a lipid profile and he can do this when he comes back for vascular ultrasound studies in July.  He is without symptoms of angina today.  Still having pain in his leg due to the ones that are not healing.  He remains on aspirin and Plavix.  Overall doing well without any bleeding issues.  We will need intervention of the left leg as well.  Problem List  1. Diabetes -A1c 6.3 2. CKD 3 3. HLD -T chol 154, HDL 43, LDL 89, TG 121 4. HTN 5. Tobacco abuse  6.  PAD -Right SFA atherectomy and angioplasty 07/16/2019 -Critical limb ischemia with poor wound healing of right ankle wounds  Past  Medical History: Past Medical History:  Diagnosis Date  . Diabetes mellitus without complication ([place])   . Hyperlipidemia   . Hypertension   . Tobacco use     Past Surgical History: Past Surgical History:  Procedure Laterality Date  . ABDOMINAL AORTOGRAM W/LOWER EXTREMITY Left 07/16/2019   Procedure: ABDOMINAL AORTOGRAM W/LOWER EXTREMITY;  Surgeon: Lorretta Harp, MD;  Location: [place] [facility];  Service: Cardiovascular;  Laterality: Left;  . arm surgery    . PERIPHERAL VASCULAR INTERVENTION Right 07/16/2019   Procedure: PERIPHERAL VASCULAR INTERVENTION;  Surgeon: Lorretta Harp, MD;  Location: [place] [facility];  Service: Cardiovascular;  Laterality: Right;    Current Medications: Current Meds  Medication Sig  . aspirin EC 81 MG EC tablet Take 1 tablet (81 mg total) by mouth daily. Swallow whole.  Marland Kitchen atorvastatin (LIPITOR) 80 MG tablet Take 1 tablet (80 mg total) by mouth daily.  . Blood Pressure Monitor KIT Check blood pressure daily  . clopidogrel (PLAVIX) 75 MG tablet Take 1 tablet (75 mg total) by mouth daily with breakfast.  . doxycycline (VIBRA-TABS) 100 MG tablet Take 100 mg by mouth 2 (two) times daily.   Marland Kitchen gabapentin (NEURONTIN) 100 MG capsule Take 200 mg by mouth 3 (three) times  daily.  . lisinopril-hydrochlorothiazide (ZESTORETIC) 20-25 MG tablet Take 1 tablet by mouth daily.  . varenicline (CHANTIX STARTING MONTH PAK) 0.5 MG X 11 & 1 MG X 42 tablet Take one 0.5 mg tablet by mouth once daily for 3 days, then increase to one 0.5 mg tablet twice daily for 4 days, then increase to one 1 mg tablet twice daily.     Allergies:    Patient has no known allergies.   Social History: Social History   Socioeconomic History  . Marital status: Legally Separated    Spouse name: Not on file  . Number of children: Not on file  . Years of education: Not on file  . Highest education level: Not on file  Occupational History  . Not on file  Tobacco Use  . Smoking status:  Current Every Day Smoker    Packs/day: 0.50    Years: 40.00    Pack years: 20.00    Types: Cigarettes  . Smokeless tobacco: Never Used  Substance and Sexual Activity  . Alcohol use: Yes    Comment: beer and liquor daily  . Drug use: Yes    Frequency: 3.0 times per week    Types: Marijuana  . Sexual activity: Not on file  Other Topics Concern  . Not on file  Social History Narrative  . Not on file   Social Determinants of Health   Financial Resource Strain:   . Difficulty of Paying Living Expenses:   Food Insecurity:   . Worried About Charity fundraiser in the Last Year:   . Arboriculturist in the Last Year:   Transportation Needs:   . Film/video editor (Medical):   Marland Kitchen Lack of Transportation (Non-Medical):   Physical Activity:   . Days of Exercise per Week:   . Minutes of Exercise per Session:   Stress:   . Feeling of Stress :   Social Connections:   . Frequency of Communication with Friends and Family:   . Frequency of Social Gatherings with Friends and Family:   . Attends Religious Services:   . Active Member of Clubs or Organizations:   . Attends Archivist Meetings:   Marland Kitchen Marital Status:      Family History: The patient's family history includes Cancer in his mother; Diabetes in his father.  ROS:   All other ROS reviewed and negative. Pertinent positives noted in the HPI.     EKGs/Labs/Other Studies Reviewed:   The following studies were personally reviewed by me today:  EKG:  EKG is ordered today.  The ekg ordered today demonstrates normal sinus rhythm, heart rate 69, LVH by voltage, and was personally reviewed by me.   TTE 07/12/2019 1. Left ventricular ejection fraction, by estimation, is 55 to 60%. Left  ventricular ejection fraction by 3D volume is 59 %. The left ventricle has  normal function. The left ventricle has no regional wall motion  abnormalities. Left ventricular diastolic  parameters are indeterminate. The average left  ventricular global  longitudinal strain is -20.4 %. The global longitudinal strain is normal.  2. Right ventricular systolic function is normal. The right ventricular  size is normal. Tricuspid regurgitation signal is inadequate for assessing  PA pressure.  3. The mitral valve is grossly normal. Mild mitral valve regurgitation.  No evidence of mitral stenosis.  4. The aortic valve is tricuspid. Aortic valve regurgitation is not  visualized. No aortic stenosis is present.  5. The inferior vena cava is  normal in size with greater than 50%  respiratory variability, suggesting right atrial pressure of 3 mmHg.   Abdominal aortogram 07/16/2019 Procedures Performed:               1.  Ultrasound-guided left common femoral access               2.  Abdominal aortogram/bilateral iliac angiogram/bifemoral runoff               3.  Contralateral access (secondary catheter placement)               4.  Placement of a spider distal protection device in the right below the knee popliteal artery               5.  Hawk 1 directional atherectomy right SFA               6.  DCB right SFA               7.  Perclose left common femoral artery  VASC US 07/10/2019 Right: Total occlusion noted in the superficial femoral artery. Total occlusion noted in the anterior tibial artery. Total occlusion noted in the posterior tibial artery. Homogenous plaque seen within arteries with decreasing lumen size. One vessel runoff. Left: Total occlusion noted in the superficial femoral artery. Total occlusion noted in the posterior tibial artery. Total occlusion noted in the peroneal artery. Homogenous plaque seen within arteries with decreasing lumen size. One vessel runoff.  Recent Labs: 10/13/2018: ALT 23; TSH 0.590 07/17/2019: BUN 18; Creatinine, Ser 1.01; Hemoglobin 12.5; Platelets 198; Potassium 4.1; Sodium 137   Recent Lipid Panel    Component Value Date/Time   CHOL 154 10/13/2018 1008   TRIG 121 10/13/2018 1008     HDL 43 10/13/2018 1008   CHOLHDL 3.6 10/13/2018 1008   LDLCALC 89 10/13/2018 1008    Physical Exam:   VS:  BP 136/78 (BP Location: Left Arm, Patient Position: Sitting, Cuff Size: Normal)   Pulse 69   Temp (!) 96.4 F (35.8 C)   Ht '5\' 2"'  (1.575 m)   Wt 110 lb (49.9 kg)   SpO2 97%   BMI 20.12 kg/m    Wt Readings from Last 3 Encounters:  07/18/19 110 lb (49.9 kg)  07/17/19 109 lb 13.7 oz (49.8 kg)  06/19/19 113 lb 3.2 oz (51.3 kg)    General: Well nourished, well developed, in no acute distress Heart: Atraumatic, normal size  Eyes: PEERLA, EOMI  Neck: Supple, no JVD Endocrine: No thryomegaly Cardiac: Normal S1, S2; RRR; no murmurs, rubs, or gallops Lungs: Clear to auscultation bilaterally, no wheezing, rhonchi or rales  Abd: Soft, nontender, no hepatomegaly  Ext: Right femoral cath site clean dry without evidence of hematoma, right lower extremity wounds noted over the ankle, diminished pulses bilaterally Musculoskeletal: No deformities, BUE and BLE strength normal and equal Skin: Warm and dry, no rashes   Neuro: Alert and oriented to person, place, time, and situation, CNII-XII grossly intact, no focal deficits  Psych: Normal mood and affect   ASSESSMENT:   [name]. is a 59 y.o. male who presents for the following: 1. Critical lower limb ischemia   2. Right leg pain   3. Mixed hyperlipidemia   4. Essential hypertension   5. Tobacco abuse     PLAN:   1. Critical lower limb ischemia 2. Right leg pain -Significant PAD on recent vascular ultrasound studies.  He had critical limb ischemia  with poor wound healing in the right ankle.  He had right SFA atherectomy and angioplasty performed Dr. Alvester Chou on 07/16/2019.  He is doing well.  He remains on aspirin and Plavix.  His left femoral cath site is clean and dry without hematoma or evidence of bruit.  He seems to be doing well.  We will continue with aspirin and Plavix for now.  We will also continue his Lipitor 80  mg daily.  He will follow-up with Dr. Alvester Chou for vascular ultrasound studies in early July we will recheck a lipid profile at that time.  He will be intervention on the left SFA.  We will plan to see him back in 6 months but I will manage his cholesterol remotely until then.  He describes no symptoms of angina or shortness of breath.  We will let Dr. Alvester Chou finish interventions and then we will resume his care.  3. Mixed hyperlipidemia -Continue Lipitor 80 mg daily.  On aspirin Plavix as above.  Goal LDL cholesterol less than 70.  4. Essential hypertension -Acceptable today.  5. Tobacco abuse -He is still smoking.  Counseled on smoking cessation today.  He really needs to quit smoking.   Disposition: Return in about 6 months (around 01/17/2020).  Medication Adjustments/Labs and Tests Ordered: Current medicines are reviewed at length with the patient today.  Concerns regarding medicines are outlined above.  Orders Placed This Encounter  Procedures  . Lipid panel   No orders of the defined types were placed in this encounter.   Patient Instructions  Medication Instructions:  The current medical regimen is effective;  continue present plan and medications.  *If you need a refill on your cardiac medications before your next appointment, please call your pharmacy*   Lab Work: LIPID when you have blood work drawn and come see Dr.Berry   If you have labs (blood work) drawn today and your tests are completely normal, you will receive your results only by: Marland Kitchen MyChart Message (if you have MyChart) OR . A paper copy in the mail If you have any lab test that is abnormal or we need to change your treatment, we will call you to review the results.   Follow-Up: At [facility], you and your health needs are our priority.  As part of our continuing mission to provide you with exceptional heart care, we have created designated Provider Care Teams.  These Care Teams include your primary  Cardiologist (physician) and Advanced Practice Providers (APPs -  Physician Assistants and Nurse Practitioners) who all work together to provide you with the care you need, when you need it.  We recommend signing up for the patient portal called "MyChart".  Sign up information is provided on this After Visit Summary.  MyChart is used to connect with patients for Virtual Visits (Telemedicine).  Patients are able to view lab/test results, encounter notes, upcoming appointments, etc.  Non-urgent messages can be sent to your provider as well.   To learn more about what you can do with MyChart, go to [email or web address].    Your next appointment:   6 month(s)  The format for your next appointment:   In Person  Provider:   Eleonore Chiquito, MD       Time Spent with Patient: I have spent a total of 25 minutes with patient reviewing hospital notes, telemetry, EKGs, labs and examining the patient as well as establishing an assessment and plan that was discussed with the patient.  > 50% of time was  spent in direct patient care.  Signed, Addison Naegeli. Audie Box, [place]  [address] [place], [place] [address] [phone]  07/18/2019 11:36 AM

## 2019-07-18 NOTE — Telephone Encounter (Signed)
Called patient- advised to return call, as we did not need the appointment scheduled for today 06/23 with Dr.O'Neal.  Left call back number.

## 2019-07-27 ENCOUNTER — Other Ambulatory Visit (HOSPITAL_COMMUNITY): Payer: Self-pay | Admitting: Cardiovascular Disease

## 2019-07-27 ENCOUNTER — Other Ambulatory Visit: Payer: Self-pay

## 2019-07-27 ENCOUNTER — Ambulatory Visit (HOSPITAL_COMMUNITY)
Admission: RE | Admit: 2019-07-27 | Discharge: 2019-07-27 | Disposition: A | Discharge status: Home / Self Care | Payer: Medicare Other | Source: Ambulatory Visit | Attending: Cardiology | Admitting: Cardiology

## 2019-07-27 DIAGNOSIS — I998 Other disorder of circulatory system: Secondary | ICD-10-CM

## 2019-07-27 DIAGNOSIS — Z9862 Peripheral vascular angioplasty status: Secondary | ICD-10-CM

## 2019-07-27 DIAGNOSIS — I739 Peripheral vascular disease, unspecified: Secondary | ICD-10-CM

## 2019-07-27 DIAGNOSIS — I70229 Atherosclerosis of native arteries of extremities with rest pain, unspecified extremity: Secondary | ICD-10-CM

## 2019-07-31 ENCOUNTER — Encounter: Payer: Self-pay | Admitting: Cardiovascular Disease

## 2019-07-31 ENCOUNTER — Other Ambulatory Visit: Payer: Self-pay

## 2019-07-31 ENCOUNTER — Ambulatory Visit (INDEPENDENT_AMBULATORY_CARE_PROVIDER_SITE_OTHER): Payer: Medicare Other | Admitting: Cardiovascular Disease

## 2019-07-31 VITALS — BP 144/82 | HR 86 | Ht 62.0 in | Wt 105.8 lb

## 2019-07-31 DIAGNOSIS — I998 Other disorder of circulatory system: Secondary | ICD-10-CM | POA: Diagnosis not present

## 2019-07-31 DIAGNOSIS — Z01812 Encounter for preprocedural laboratory examination: Secondary | ICD-10-CM

## 2019-07-31 DIAGNOSIS — I70229 Atherosclerosis of native arteries of extremities with rest pain, unspecified extremity: Secondary | ICD-10-CM

## 2019-07-31 NOTE — Progress Notes (Signed)
07/31/2019 [name].   1960-02-05  [id number]  Primary Physician Loretha Brasil, FNP (Inactive) Primary Cardiologist: Lorretta Harp MD FACP, [place], [place], [place]  HPI:  [name]. is a 59 y.o.  thin and chronically ill-appearing separated African-American male with no children who does not work because of being disabled.  Is referred by Dr. Audie Box to me for evaluation treatment of critical limb ischemia.    I last saw him in the office 07/10/2019.  The patient was initially referred to our practice by Dr. Daylene Katayama, podiatry.  His risk factors include 20 to 30 pack years of tobacco use having smoked for last 45 years, treated hypertension and hyperlipidemia.  He is never had a heart attack or stroke.  He denies chest pain or shortness of breath.  He apparently had 3 cortisone shots in his right foot 4 to 5 months ago which resulted in wounds at the injection sites which have not healed.  Also complains of claudication bilaterally.  Recent Dopplers performed today revealed a right ABI of 0.60 and a left of 0.45 with occluded SFAs bilaterally.  He does have CKD 3 with serum creatinine in the mid 1 range.  I performed peripheral angiography on him 07/16/2019 revealing occluded SFAs bilaterally with one-vessel runoff.  I performed directional atherectomy followed by drug-coated balloon angioplasty of his right SFA.  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  His right ABI increased from 0.60-0.85.  The wounds on his right foot are still painful and still have not yet to heal.   Current Meds  Medication Sig  . aspirin EC 81 MG EC tablet Take 1 tablet (81 mg total) by mouth daily. Swallow whole.  Marland Kitchen atorvastatin (LIPITOR) 80 MG tablet Take 1 tablet (80 mg total) by mouth daily.  . Blood Pressure Monitor KIT Check blood pressure daily  . clopidogrel (PLAVIX) 75 MG tablet Take 1 tablet (75 mg total) by mouth daily with breakfast.  . doxycycline  (VIBRA-TABS) 100 MG tablet Take 100 mg by mouth 2 (two) times daily.   Marland Kitchen gabapentin (NEURONTIN) 100 MG capsule Take 200 mg by mouth 3 (three) times daily.  Marland Kitchen lisinopril-hydrochlorothiazide (ZESTORETIC) 20-25 MG tablet Take 1 tablet by mouth daily.  . varenicline (CHANTIX STARTING MONTH PAK) 0.5 MG X 11 & 1 MG X 42 tablet Take one 0.5 mg tablet by mouth once daily for 3 days, then increase to one 0.5 mg tablet twice daily for 4 days, then increase to one 1 mg tablet twice daily.     No Known Allergies  Social History   Socioeconomic History  . Marital status: Legally Separated    Spouse name: Not on file  . Number of children: Not on file  . Years of education: Not on file  . Highest education level: Not on file  Occupational History  . Not on file  Tobacco Use  . Smoking status: Current Every Day Smoker    Packs/day: 0.50    Years: 40.00    Pack years: 20.00    Types: Cigarettes  . Smokeless tobacco: Never Used  Substance and Sexual Activity  . Alcohol use: Yes    Comment: beer and liquor daily  . Drug use: Yes    Frequency: 3.0 times per week    Types: Marijuana  . Sexual activity: Not on file  Other Topics Concern  . Not on file  Social History Narrative  .  Not on file   Social Determinants of Health   Financial Resource Strain:   . Difficulty of Paying Living Expenses:   Food Insecurity:   . Worried About Charity fundraiser in the Last Year:   . Arboriculturist in the Last Year:   Transportation Needs:   . Film/video editor (Medical):   Marland Kitchen Lack of Transportation (Non-Medical):   Physical Activity:   . Days of Exercise per Week:   . Minutes of Exercise per Session:   Stress:   . Feeling of Stress :   Social Connections:   . Frequency of Communication with Friends and Family:   . Frequency of Social Gatherings with Friends and Family:   . Attends Religious Services:   . Active Member of Clubs or Organizations:   . Attends Archivist Meetings:     Marland Kitchen Marital Status:   Intimate Partner Violence:   . Fear of Current or Ex-Partner:   . Emotionally Abused:   Marland Kitchen Physically Abused:   . Sexually Abused:      Review of Systems: General: negative for chills, fever, night sweats or weight changes.  Cardiovascular: negative for chest pain, dyspnea on exertion, edema, orthopnea, palpitations, paroxysmal nocturnal dyspnea or shortness of breath Dermatological: negative for rash Respiratory: negative for cough or wheezing Urologic: negative for hematuria Abdominal: negative for nausea, vomiting, diarrhea, bright red blood per rectum, melena, or hematemesis Neurologic: negative for visual changes, syncope, or dizziness All other systems reviewed and are otherwise negative except as noted above.    Blood pressure (!) 144/82, pulse 86, height _0  (1.575 m), weight 105 lb 12.8 oz (48 kg), SpO2 99 %.  General appearance: alert and no distress Neck: no adenopathy, no carotid bruit, no JVD, supple, symmetrical, trachea midline and thyroid not enlarged, symmetric, no tenderness/mass/nodules Lungs: clear to auscultation bilaterally Heart: regular rate and rhythm, S1, S2 normal, no murmur, click, rub or gallop Extremities: extremities normal, atraumatic, no cyanosis or edema Pulses: Diminished pedal pulses bilaterally Skin: 3 circular lesions on the ventral aspect of his right foot Neurologic: Alert and oriented X 3, normal strength and tone. Normal symmetric reflexes. Normal coordination and gait  EKG not performed today  ASSESSMENT AND PLAN:   Critical ischemia of foot [name] returns a for follow-up.  He was referred to me by Dr. Audie Box for critical limb ischemia.  He has 3 wounds on the ventral surface of his right foot secondary to steroid injections 5 or 6 months ago which never healed.  I angiogrammed him 07/16/2019 revealing total SFAs bilaterally with one-vessel runoff on the right and on the left.  He did have patent peroneal with  moderate disease in in the proximal portion.  I performed directional arthrectomy 5 but followed by drug-coated balloon angioplasty.  His follow-up Doppler studies performed 07/28/2019 revealed an increase in his right ABI from 0.6 up to 0.85.  He says the wound still are painful and have not healed.  I am going to refer him to the vascular wound care center for further aggressive local wound care.      Lorretta Harp MD FACP,FACC,FAHA, [facility] 07/31/2019 1:23 PM

## 2019-07-31 NOTE — Assessment & Plan Note (Signed)
[name] returns a for follow-up.  He was referred to me by Dr. Audie Box for critical limb ischemia.  He has 3 wounds on the ventral surface of his right foot secondary to steroid injections 5 or 6 months ago which never healed.  I angiogrammed him 07/16/2019 revealing total SFAs bilaterally with one-vessel runoff on the right and on the left.  He did have patent peroneal with moderate disease in in the proximal portion.  I performed directional arthrectomy 5 but followed by drug-coated balloon angioplasty.  His follow-up Doppler studies performed 07/28/2019 revealed an increase in his right ABI from 0.6 up to 0.85.  He says the wound still are painful and have not healed.  I am going to refer him to the vascular wound care center for further aggressive local wound care.

## 2019-07-31 NOTE — Patient Instructions (Addendum)
Medication Instructions:  Your Physician recommend you continue on your current medication as directed.    *If you need a refill on your cardiac medications before your next appointment, please call your pharmacy*  Lab Work: Your physician recommends that you return for lab work today ( CBC, BMP)  Testing/Procedures: Your physician has requested that you have a peripheral vascular angiogram. This exam is performed at the hospital. During this exam IV contrast is used to look at arterial blood flow. Please review the information sheet given for details.  Your physician has requested that you have an ankle brachial index (ABI) . During this test an ultrasound and blood pressure cuff are used to evaluate the arteries that supply the arms and legs with blood. Allow thirty minutes for this exam. There are no restrictions or special instructions.  Your physician has requested that you have a lower extremity arterial exercise duplex. During this test, exercise and ultrasound are used to evaluate arterial blood flow in the legs. Allow one hour for this exam. There are no restrictions or special instructions.   Follow-Up: At [facility], you and your health needs are our priority. As part of our continuing mission to provide you with exceptional heart care, we have created designated Provider Care Teams. These Care Teams include your primary Cardiologist (physician) and Advanced Practice Providers (APPs -  Physician Assistants and Nurse Practitioners) who all work together to provide you with the care you need, when you need it.  We recommend signing up for the patient portal called "MyChart".  Sign up information is provided on this After Visit Summary.  MyChart is used to connect with patients for Virtual Visits (Telemedicine).  Patients are able to view lab/test results, encounter notes, upcoming appointments, etc.  Non-urgent messages can be sent to your provider as well.   To learn more  about what you can do with MyChart, go to [email or web address].    Your next appointment:   4 week(s)  The format for your next appointment:   In Person  Provider:   Quay Burow, MD  [place] [address] Dept: [phone] Loc: [place].               07/31/2019  You are scheduled for a Peripheral Angiogram on  Thursday, July 22 with Dr. Quay Burow.  1. Please arrive at the [facility] (Main Entrance A) at [facility]: [address] [place], [place] [address] at 5:30 AM (This time is two hours before your procedure to ensure your preparation). Free valet parking service is available.   Special note: Every effort is made to have your procedure done on time. Please understand that emergencies sometimes delay scheduled procedures.  2. Diet: Do not eat solid foods after midnight.  The patient may have clear liquids until 5am upon the day of the procedure.  3. Labs: You will need to have blood drawn today ( CBC. BMP)  4. Medication instructions in preparation for your procedure:   Contrast Allergy: No  On the morning of your procedure, take your Aspirin and any morning medicines NOT listed above.  You may use sips of water.  5. Plan for one night stay--bring personal belongings. 6. Bring a current list of your medications and current insurance cards. 7. You MUST have a responsible  person to drive you home. 8. Someone MUST be with you the first 24 hours after you arrive home or your discharge will be delayed. 9. Please wear clothes that are easy to get on and off and wear slip-on shoes.  Thank you for allowing [place] to care for you!   -- [place] [facility]

## 2019-07-31 NOTE — H&P (View-Only) (Signed)
07/31/2019 [name].   1960-09-08  [id number]  Primary Physician Loretha Brasil, FNP (Inactive) Primary Cardiologist: Lorretta Harp MD FACP, [place], [place], [place]  HPI:  [name]. is a 59 y.o.  thin and chronically ill-appearing separated African-American male with no children who does not work because of being disabled.  Is referred by Dr. Audie Box to me for evaluation treatment of critical limb ischemia.    I last saw him in the office 07/10/2019.  The patient was initially referred to our practice by Dr. Daylene Katayama, podiatry.  His risk factors include 20 to 30 pack years of tobacco use having smoked for last 45 years, treated hypertension and hyperlipidemia.  He is never had a heart attack or stroke.  He denies chest pain or shortness of breath.  He apparently had 3 cortisone shots in his right foot 4 to 5 months ago which resulted in wounds at the injection sites which have not healed.  Also complains of claudication bilaterally.  Recent Dopplers performed today revealed a right ABI of 0.60 and a left of 0.45 with occluded SFAs bilaterally.  He does have CKD 3 with serum creatinine in the mid 1 range.  I performed peripheral angiography on him 07/16/2019 revealing occluded SFAs bilaterally with one-vessel runoff.  I performed directional atherectomy followed by drug-coated balloon angioplasty of his right SFA.  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  His right ABI increased from 0.60-0.85.  The wounds on his right foot are still painful and still have not yet to heal.   Current Meds  Medication Sig  . aspirin EC 81 MG EC tablet Take 1 tablet (81 mg total) by mouth daily. Swallow whole.  Marland Kitchen atorvastatin (LIPITOR) 80 MG tablet Take 1 tablet (80 mg total) by mouth daily.  . Blood Pressure Monitor KIT Check blood pressure daily  . clopidogrel (PLAVIX) 75 MG tablet Take 1 tablet (75 mg total) by mouth daily with breakfast.  . doxycycline  (VIBRA-TABS) 100 MG tablet Take 100 mg by mouth 2 (two) times daily.   Marland Kitchen gabapentin (NEURONTIN) 100 MG capsule Take 200 mg by mouth 3 (three) times daily.  Marland Kitchen lisinopril-hydrochlorothiazide (ZESTORETIC) 20-25 MG tablet Take 1 tablet by mouth daily.  . varenicline (CHANTIX STARTING MONTH PAK) 0.5 MG X 11 & 1 MG X 42 tablet Take one 0.5 mg tablet by mouth once daily for 3 days, then increase to one 0.5 mg tablet twice daily for 4 days, then increase to one 1 mg tablet twice daily.     No Known Allergies  Social History   Socioeconomic History  . Marital status: Legally Separated    Spouse name: Not on file  . Number of children: Not on file  . Years of education: Not on file  . Highest education level: Not on file  Occupational History  . Not on file  Tobacco Use  . Smoking status: Current Every Day Smoker    Packs/day: 0.50    Years: 40.00    Pack years: 20.00    Types: Cigarettes  . Smokeless tobacco: Never Used  Substance and Sexual Activity  . Alcohol use: Yes    Comment: beer and liquor daily  . Drug use: Yes    Frequency: 3.0 times per week    Types: Marijuana  . Sexual activity: Not on file  Other Topics Concern  . Not on file  Social History Narrative  .  Not on file   Social Determinants of Health   Financial Resource Strain:   . Difficulty of Paying Living Expenses:   Food Insecurity:   . Worried About Charity fundraiser in the Last Year:   . Arboriculturist in the Last Year:   Transportation Needs:   . Film/video editor (Medical):   Marland Kitchen Lack of Transportation (Non-Medical):   Physical Activity:   . Days of Exercise per Week:   . Minutes of Exercise per Session:   Stress:   . Feeling of Stress :   Social Connections:   . Frequency of Communication with Friends and Family:   . Frequency of Social Gatherings with Friends and Family:   . Attends Religious Services:   . Active Member of Clubs or Organizations:   . Attends Archivist Meetings:     Marland Kitchen Marital Status:   Intimate Partner Violence:   . Fear of Current or Ex-Partner:   . Emotionally Abused:   Marland Kitchen Physically Abused:   . Sexually Abused:      Review of Systems: General: negative for chills, fever, night sweats or weight changes.  Cardiovascular: negative for chest pain, dyspnea on exertion, edema, orthopnea, palpitations, paroxysmal nocturnal dyspnea or shortness of breath Dermatological: negative for rash Respiratory: negative for cough or wheezing Urologic: negative for hematuria Abdominal: negative for nausea, vomiting, diarrhea, bright red blood per rectum, melena, or hematemesis Neurologic: negative for visual changes, syncope, or dizziness All other systems reviewed and are otherwise negative except as noted above.    Blood pressure (!) 144/82, pulse 86, height _0  (1.575 m), weight 105 lb 12.8 oz (48 kg), SpO2 99 %.  General appearance: alert and no distress Neck: no adenopathy, no carotid bruit, no JVD, supple, symmetrical, trachea midline and thyroid not enlarged, symmetric, no tenderness/mass/nodules Lungs: clear to auscultation bilaterally Heart: regular rate and rhythm, S1, S2 normal, no murmur, click, rub or gallop Extremities: extremities normal, atraumatic, no cyanosis or edema Pulses: Diminished pedal pulses bilaterally Skin: 3 circular lesions on the ventral aspect of his right foot Neurologic: Alert and oriented X 3, normal strength and tone. Normal symmetric reflexes. Normal coordination and gait  EKG not performed today  ASSESSMENT AND PLAN:   Critical ischemia of foot [name] returns a for follow-up.  He was referred to me by Dr. Audie Box for critical limb ischemia.  He has 3 wounds on the ventral surface of his right foot secondary to steroid injections 5 or 6 months ago which never healed.  I angiogrammed him 07/16/2019 revealing total SFAs bilaterally with one-vessel runoff on the right and on the left.  He did have patent peroneal with  moderate disease in in the proximal portion.  I performed directional arthrectomy 5 but followed by drug-coated balloon angioplasty.  His follow-up Doppler studies performed 07/28/2019 revealed an increase in his right ABI from 0.6 up to 0.85.  He says the wound still are painful and have not healed.  I am going to refer him to the vascular wound care center for further aggressive local wound care.      Lorretta Harp MD FACP,FACC,FAHA, [facility] 07/31/2019 1:23 PM

## 2019-08-01 LAB — CBC WITH DIFFERENTIAL/PLATELET
Basophils Absolute: 0 10*3/uL (ref 0.0–0.2)
Basos: 0 %
EOS (ABSOLUTE): 0.2 10*3/uL (ref 0.0–0.4)
Eos: 4 %
Hematocrit: 41.4 % (ref 37.5–51.0)
Hemoglobin: 13.6 g/dL (ref 13.0–17.7)
Immature Grans (Abs): 0 10*3/uL (ref 0.0–0.1)
Immature Granulocytes: 0 %
Lymphocytes Absolute: 1.5 10*3/uL (ref 0.7–3.1)
Lymphs: 29 %
MCH: 30 pg (ref 26.6–33.0)
MCHC: 32.9 g/dL (ref 31.5–35.7)
MCV: 91 fL (ref 79–97)
Monocytes Absolute: 0.5 10*3/uL (ref 0.1–0.9)
Monocytes: 9 %
Neutrophils Absolute: 3.1 10*3/uL (ref 1.4–7.0)
Neutrophils: 58 %
Platelets: 276 10*3/uL (ref 150–450)
RBC: 4.54 x10E6/uL (ref 4.14–5.80)
RDW: 13.9 % (ref 11.6–15.4)
WBC: 5.3 10*3/uL (ref 3.4–10.8)

## 2019-08-01 LAB — BASIC METABOLIC PANEL
BUN/Creatinine Ratio: 17 (ref 9–20)
BUN: 22 mg/dL (ref 6–24)
CO2: 24 mmol/L (ref 20–29)
Calcium: 9.8 mg/dL (ref 8.7–10.2)
Chloride: 103 mmol/L (ref 96–106)
Creatinine, Ser: 1.28 mg/dL — ABNORMAL HIGH (ref 0.76–1.27)
GFR calc Af Amer: 70 mL/min/{1.73_m2} (ref >59)
GFR calc non Af Amer: 61 mL/min/{1.73_m2} (ref >59)
Glucose: 145 mg/dL — ABNORMAL HIGH (ref 65–99)
Potassium: 4.1 mmol/L (ref 3.5–5.2)
Sodium: 145 mmol/L — ABNORMAL HIGH (ref 134–144)

## 2019-08-03 NOTE — Addendum Note (Signed)
Addended by: Darlen Round on: 08/03/2019 02:24 PM   Modules accepted: Orders

## 2019-08-06 ENCOUNTER — Telehealth: Payer: Self-pay

## 2019-08-06 NOTE — Telephone Encounter (Signed)
Sent letter to pt regarding returning their cologuard kit.

## 2019-08-08 ENCOUNTER — Other Ambulatory Visit: Payer: Self-pay

## 2019-08-08 DIAGNOSIS — I70229 Atherosclerosis of native arteries of extremities with rest pain, unspecified extremity: Secondary | ICD-10-CM

## 2019-08-09 ENCOUNTER — Other Ambulatory Visit: Payer: Self-pay | Admitting: *Deleted

## 2019-08-09 DIAGNOSIS — I70229 Atherosclerosis of native arteries of extremities with rest pain, unspecified extremity: Secondary | ICD-10-CM

## 2019-08-09 MED ORDER — SODIUM CHLORIDE 0.9% FLUSH: 3.0000 mL | Freq: Two times a day (BID) | INTRAVENOUS | Status: DC

## 2019-08-14 ENCOUNTER — Telehealth: Payer: Self-pay | Admitting: *Deleted

## 2019-08-14 NOTE — Telephone Encounter (Signed)
Pt contacted pre-abdominal aortogram  scheduled at [facility] for: Thursday August 16, 2019 7:30 AM Verified arrival time and place: [facility]) at: 5:30 AM   No solid food after midnight prior to cath, clear liquids until 5 AM day of procedure.  Hold: Lisinopril-HCT-AM of procedure  Except hold medications AM meds can be  taken pre-cath with sips of water including: ASA 81 mg Plavix 75 mg  Confirmed patient has responsible adult to drive home post procedure and observe 24 hours after arriving home: yes  You are allowed ONE visitor in the waiting room during your procedure. Both you and your visitor must wear a mask once you enter the hospital.      COVID-19 Pre-Screening Questions:  . In the past 14 days have you had a new cough associated with shortness of breath, fever (100.4 or greater) or sudden loss of taste or sense of smell? no . In the past 14 days have you been around anyone with known Covid 19? no . Any international travel in the past 14 days? no . Have you been vaccinated for COVID-19? Yes, see immunization history  Reviewed procedure/mask/visitor instructions, COVID-19  questions with patient.

## 2019-08-15 ENCOUNTER — Other Ambulatory Visit: Payer: Self-pay

## 2019-08-15 ENCOUNTER — Encounter (HOSPITAL_BASED_OUTPATIENT_CLINIC_OR_DEPARTMENT_OTHER): Payer: Medicare Other | Attending: Physician Assistant | Admitting: Physician Assistant

## 2019-08-15 DIAGNOSIS — E1151 Type 2 diabetes mellitus with diabetic peripheral angiopathy without gangrene: Secondary | ICD-10-CM | POA: Insufficient documentation

## 2019-08-15 DIAGNOSIS — Z8249 Family history of ischemic heart disease and other diseases of the circulatory system: Secondary | ICD-10-CM | POA: Diagnosis not present

## 2019-08-15 DIAGNOSIS — L97512 Non-pressure chronic ulcer of other part of right foot with fat layer exposed: Secondary | ICD-10-CM | POA: Insufficient documentation

## 2019-08-15 DIAGNOSIS — F17210 Nicotine dependence, cigarettes, uncomplicated: Secondary | ICD-10-CM | POA: Diagnosis not present

## 2019-08-15 DIAGNOSIS — E1140 Type 2 diabetes mellitus with diabetic neuropathy, unspecified: Secondary | ICD-10-CM | POA: Insufficient documentation

## 2019-08-15 DIAGNOSIS — I10 Essential (primary) hypertension: Secondary | ICD-10-CM | POA: Insufficient documentation

## 2019-08-15 DIAGNOSIS — E11621 Type 2 diabetes mellitus with foot ulcer: Secondary | ICD-10-CM | POA: Insufficient documentation

## 2019-08-15 DIAGNOSIS — Z833 Family history of diabetes mellitus: Secondary | ICD-10-CM | POA: Diagnosis not present

## 2019-08-16 ENCOUNTER — Encounter (HOSPITAL_COMMUNITY): Payer: Self-pay | Admitting: Cardiovascular Disease

## 2019-08-16 ENCOUNTER — Encounter (HOSPITAL_COMMUNITY)
Admission: RE | Disposition: A | Discharge status: Home / Self Care | Payer: Self-pay | Source: Home / Self Care | Attending: Cardiovascular Disease

## 2019-08-16 ENCOUNTER — Ambulatory Visit (HOSPITAL_COMMUNITY)
Admission: RE | Admit: 2019-08-16 | Discharge: 2019-08-17 | Disposition: A | Discharge status: Home / Self Care | Payer: Medicare Other | Attending: Cardiovascular Disease | Admitting: Cardiovascular Disease

## 2019-08-16 DIAGNOSIS — I998 Other disorder of circulatory system: Secondary | ICD-10-CM

## 2019-08-16 DIAGNOSIS — I739 Peripheral vascular disease, unspecified: Secondary | ICD-10-CM | POA: Diagnosis present

## 2019-08-16 DIAGNOSIS — F17210 Nicotine dependence, cigarettes, uncomplicated: Secondary | ICD-10-CM | POA: Insufficient documentation

## 2019-08-16 DIAGNOSIS — I70213 Atherosclerosis of native arteries of extremities with intermittent claudication, bilateral legs: Secondary | ICD-10-CM

## 2019-08-16 DIAGNOSIS — I70235 Atherosclerosis of native arteries of right leg with ulceration of other part of foot: Secondary | ICD-10-CM | POA: Diagnosis not present

## 2019-08-16 DIAGNOSIS — E1151 Type 2 diabetes mellitus with diabetic peripheral angiopathy without gangrene: Secondary | ICD-10-CM | POA: Diagnosis present

## 2019-08-16 DIAGNOSIS — M5137 Other intervertebral disc degeneration, lumbosacral region: Secondary | ICD-10-CM | POA: Diagnosis not present

## 2019-08-16 DIAGNOSIS — I129 Hypertensive chronic kidney disease with stage 1 through stage 4 chronic kidney disease, or unspecified chronic kidney disease: Secondary | ICD-10-CM | POA: Insufficient documentation

## 2019-08-16 DIAGNOSIS — E785 Hyperlipidemia, unspecified: Secondary | ICD-10-CM | POA: Insufficient documentation

## 2019-08-16 DIAGNOSIS — Z7902 Long term (current) use of antithrombotics/antiplatelets: Secondary | ICD-10-CM | POA: Insufficient documentation

## 2019-08-16 DIAGNOSIS — D649 Anemia, unspecified: Secondary | ICD-10-CM | POA: Insufficient documentation

## 2019-08-16 DIAGNOSIS — L97519 Non-pressure chronic ulcer of other part of right foot with unspecified severity: Secondary | ICD-10-CM | POA: Insufficient documentation

## 2019-08-16 DIAGNOSIS — N183 Chronic kidney disease, stage 3 unspecified: Secondary | ICD-10-CM | POA: Insufficient documentation

## 2019-08-16 DIAGNOSIS — E11621 Type 2 diabetes mellitus with foot ulcer: Secondary | ICD-10-CM | POA: Insufficient documentation

## 2019-08-16 DIAGNOSIS — E1122 Type 2 diabetes mellitus with diabetic chronic kidney disease: Secondary | ICD-10-CM | POA: Diagnosis not present

## 2019-08-16 DIAGNOSIS — Z79899 Other long term (current) drug therapy: Secondary | ICD-10-CM | POA: Insufficient documentation

## 2019-08-16 DIAGNOSIS — I70229 Atherosclerosis of native arteries of extremities with rest pain, unspecified extremity: Secondary | ICD-10-CM

## 2019-08-16 DIAGNOSIS — Z7982 Long term (current) use of aspirin: Secondary | ICD-10-CM | POA: Diagnosis not present

## 2019-08-16 HISTORY — DX: Peripheral vascular disease, unspecified: I73.9

## 2019-08-16 HISTORY — PX: ABDOMINAL AORTOGRAM W/LOWER EXTREMITY: CATH118223

## 2019-08-16 HISTORY — PX: PERIPHERAL VASCULAR BALLOON ANGIOPLASTY: CATH118281

## 2019-08-16 LAB — BASIC METABOLIC PANEL
Anion gap: 7 (ref 5–15)
BUN: 23 mg/dL — ABNORMAL HIGH (ref 6–20)
CO2: 26 mmol/L (ref 22–32)
Calcium: 9 mg/dL (ref 8.9–10.3)
Chloride: 105 mmol/L (ref 98–111)
Creatinine, Ser: 1.18 mg/dL (ref 0.61–1.24)
GFR calc Af Amer: >60 mL/min (ref >60)
GFR calc non Af Amer: >60 mL/min (ref >60)
Glucose, Bld: 160 mg/dL — ABNORMAL HIGH (ref 70–99)
Potassium: 3.9 mmol/L (ref 3.5–5.1)
Sodium: 138 mmol/L (ref 135–145)

## 2019-08-16 LAB — GLUCOSE, CAPILLARY
Glucose-Capillary: 104 mg/dL — ABNORMAL HIGH (ref 70–99)
Glucose-Capillary: 105 mg/dL — ABNORMAL HIGH (ref 70–99)

## 2019-08-16 LAB — POCT ACTIVATED CLOTTING TIME: Activated Clotting Time: 329 seconds

## 2019-08-16 SURGERY — ABDOMINAL AORTOGRAM W/LOWER EXTREMITY
Anesthesia: LOCAL

## 2019-08-16 MED ORDER — SODIUM CHLORIDE 0.9% FLUSH: 3.0000 mL | Freq: Two times a day (BID) | INTRAVENOUS | Status: DC

## 2019-08-16 MED ORDER — HEPARIN SODIUM (PORCINE) 1000 UNIT/ML IJ SOLN
INTRAMUSCULAR | Status: AC
  Filled 2019-08-16: qty 1

## 2019-08-16 MED ORDER — HEPARIN (PORCINE) IN NACL 1000-0.9 UT/500ML-% IV SOLN
INTRAVENOUS | Status: DC | PRN
  Administered 2019-08-16 (×2): 500 mL

## 2019-08-16 MED ORDER — LABETALOL HCL 5 MG/ML IV SOLN: 10.0000 mg | INTRAVENOUS | Status: DC | PRN

## 2019-08-16 MED ORDER — LIDOCAINE HCL (PF) 1 % IJ SOLN
INTRAMUSCULAR | Status: DC | PRN
  Administered 2019-08-16: 15 mL via INTRADERMAL

## 2019-08-16 MED ORDER — CLOPIDOGREL BISULFATE 75 MG PO TABS
75.0000 mg | ORAL_TABLET | Freq: Every day | ORAL | Status: DC
  Administered 2019-08-17: 75 mg via ORAL
  Filled 2019-08-16: qty 1

## 2019-08-16 MED ORDER — HYDROCHLOROTHIAZIDE 25 MG PO TABS
25.0000 mg | ORAL_TABLET | Freq: Every day | ORAL | Status: DC
  Administered 2019-08-17: 25 mg via ORAL
  Filled 2019-08-16: qty 1

## 2019-08-16 MED ORDER — LISINOPRIL 20 MG PO TABS
20.0000 mg | ORAL_TABLET | Freq: Every day | ORAL | Status: DC
  Administered 2019-08-16 – 2019-08-17 (×2): 20 mg via ORAL
  Filled 2019-08-16 (×2): qty 1

## 2019-08-16 MED ORDER — HEPARIN (PORCINE) IN NACL 1000-0.9 UT/500ML-% IV SOLN
INTRAVENOUS | Status: AC
  Filled 2019-08-16: qty 1000

## 2019-08-16 MED ORDER — SODIUM CHLORIDE 0.9 % IV SOLN: 250.0000 mL | INTRAVENOUS | Status: DC | PRN

## 2019-08-16 MED ORDER — MIDAZOLAM HCL 2 MG/2ML IJ SOLN
INTRAMUSCULAR | Status: AC
  Filled 2019-08-16: qty 2

## 2019-08-16 MED ORDER — HEPARIN SODIUM (PORCINE) 1000 UNIT/ML IJ SOLN
INTRAMUSCULAR | Status: DC | PRN
  Administered 2019-08-16: 6000 [IU] via INTRAVENOUS

## 2019-08-16 MED ORDER — DOXYCYCLINE HYCLATE 100 MG PO TABS
100.0000 mg | ORAL_TABLET | Freq: Two times a day (BID) | ORAL | Status: DC
  Administered 2019-08-16 – 2019-08-17 (×3): 100 mg via ORAL
  Filled 2019-08-16 (×3): qty 1

## 2019-08-16 MED ORDER — FENTANYL CITRATE (PF) 100 MCG/2ML IJ SOLN
INTRAMUSCULAR | Status: AC
  Filled 2019-08-16: qty 2

## 2019-08-16 MED ORDER — HYDRALAZINE HCL 20 MG/ML IJ SOLN
INTRAMUSCULAR | Status: AC
  Filled 2019-08-16: qty 1

## 2019-08-16 MED ORDER — LIDOCAINE HCL (PF) 1 % IJ SOLN
INTRAMUSCULAR | Status: AC
  Filled 2019-08-16: qty 30

## 2019-08-16 MED ORDER — ONDANSETRON HCL 4 MG/2ML IJ SOLN: 4.0000 mg | Freq: Four times a day (QID) | INTRAMUSCULAR | Status: DC | PRN

## 2019-08-16 MED ORDER — ASPIRIN 81 MG PO CHEW
81.0000 mg | CHEWABLE_TABLET | ORAL | Status: AC
  Administered 2019-08-16: 81 mg via ORAL
  Filled 2019-08-16: qty 1

## 2019-08-16 MED ORDER — GABAPENTIN 100 MG PO CAPS
200.0000 mg | ORAL_CAPSULE | Freq: Three times a day (TID) | ORAL | Status: DC
  Administered 2019-08-16 – 2019-08-17 (×4): 200 mg via ORAL
  Filled 2019-08-16 (×4): qty 2

## 2019-08-16 MED ORDER — MIDAZOLAM HCL 2 MG/2ML IJ SOLN
INTRAMUSCULAR | Status: DC | PRN
  Administered 2019-08-16: 1 mg via INTRAVENOUS

## 2019-08-16 MED ORDER — HYDRALAZINE HCL 20 MG/ML IJ SOLN: 5.0000 mg | INTRAMUSCULAR | Status: DC | PRN

## 2019-08-16 MED ORDER — ASPIRIN EC 81 MG PO TBEC
81.0000 mg | DELAYED_RELEASE_TABLET | Freq: Every day | ORAL | Status: DC
  Administered 2019-08-17: 81 mg via ORAL
  Filled 2019-08-16 (×2): qty 1

## 2019-08-16 MED ORDER — ACETAMINOPHEN 325 MG PO TABS
650.0000 mg | ORAL_TABLET | ORAL | Status: DC | PRN
  Administered 2019-08-16: 650 mg via ORAL
  Filled 2019-08-16: qty 2

## 2019-08-16 MED ORDER — IODIXANOL 320 MG/ML IV SOLN
INTRAVENOUS | Status: DC | PRN
  Administered 2019-08-16: 200 mL via INTRA_ARTERIAL

## 2019-08-16 MED ORDER — ASPIRIN 81 MG PO TBEC: 81.0000 mg | DELAYED_RELEASE_TABLET | Freq: Every day | ORAL | Status: DC

## 2019-08-16 MED ORDER — SODIUM CHLORIDE 0.9 % IV SOLN: INTRAVENOUS | Status: AC

## 2019-08-16 MED ORDER — SODIUM CHLORIDE 0.9 % WEIGHT BASED INFUSION
3.0000 mL/kg/h | INTRAVENOUS | Status: DC
  Administered 2019-08-16: 3 mL/kg/h via INTRAVENOUS

## 2019-08-16 MED ORDER — FENTANYL CITRATE (PF) 100 MCG/2ML IJ SOLN
INTRAMUSCULAR | Status: DC | PRN
  Administered 2019-08-16 (×4): 25 ug via INTRAVENOUS

## 2019-08-16 MED ORDER — HYDRALAZINE HCL 20 MG/ML IJ SOLN
INTRAMUSCULAR | Status: DC | PRN
  Administered 2019-08-16: 10 mg via INTRAVENOUS

## 2019-08-16 MED ORDER — SODIUM CHLORIDE 0.9% FLUSH: 3.0000 mL | INTRAVENOUS | Status: DC | PRN

## 2019-08-16 MED ORDER — SODIUM CHLORIDE 0.9 % WEIGHT BASED INFUSION: 1.0000 mL/kg/h | INTRAVENOUS | Status: DC

## 2019-08-16 MED ORDER — LISINOPRIL-HYDROCHLOROTHIAZIDE 20-25 MG PO TABS: 1.0000 | ORAL_TABLET | Freq: Every day | ORAL | Status: DC

## 2019-08-16 MED ORDER — CLOPIDOGREL BISULFATE 75 MG PO TABS: 75.0000 mg | ORAL_TABLET | Freq: Every day | ORAL | Status: DC

## 2019-08-16 MED ORDER — SODIUM CHLORIDE 0.9% FLUSH
3.0000 mL | Freq: Two times a day (BID) | INTRAVENOUS | Status: DC
  Administered 2019-08-17: 3 mL via INTRAVENOUS

## 2019-08-16 MED ORDER — ATORVASTATIN CALCIUM 80 MG PO TABS
80.0000 mg | ORAL_TABLET | Freq: Every day | ORAL | Status: DC
  Administered 2019-08-16 – 2019-08-17 (×2): 80 mg via ORAL
  Filled 2019-08-16 (×2): qty 1

## 2019-08-16 MED ORDER — CLOPIDOGREL BISULFATE 75 MG PO TABS
75.0000 mg | ORAL_TABLET | Freq: Once | ORAL | Status: AC
  Administered 2019-08-16: 75 mg via ORAL
  Filled 2019-08-16: qty 1

## 2019-08-16 MED ORDER — MORPHINE SULFATE (PF) 2 MG/ML IV SOLN
2.0000 mg | INTRAVENOUS | Status: DC | PRN
  Administered 2019-08-16 – 2019-08-17 (×2): 2 mg via INTRAVENOUS
  Filled 2019-08-16 (×2): qty 1

## 2019-08-16 SURGICAL SUPPLY — 30 items
BAG SNAP BAND KOVER 36X36 (MISCELLANEOUS) ×2 IMPLANT
CATH 0.018 NAVICROSS ST 135 (CATHETERS) ×1 IMPLANT
CATH ANGIO 5F PIGTAIL 65CM (CATHETERS) ×1 IMPLANT
CATH CROSS OVER TEMPO 5F (CATHETERS) ×1 IMPLANT
CATH VIANCE CROSS STAND 150CM (MICROCATHETER) ×3
CATH VIANCE CROSS STD 150CM (MICROCATHETER) IMPLANT
COVER DOME SNAP 22 D (MISCELLANEOUS) ×1 IMPLANT
DEVICE CLOSURE PERCLS PRGLD 6F (VASCULAR PRODUCTS) IMPLANT
DEVICE TORQUE .014-.018 (MISCELLANEOUS) IMPLANT
GLIDEWIRE ANGLED NITR .018X260 (WIRE) ×1 IMPLANT
GUIDEWIRE ZILIENT 12G 018 (WIRE) ×1 IMPLANT
GUIDEWIRE ZILIENT 6G 014 (WIRE) ×1 IMPLANT
KIT ESSENTIALS PG (KITS) ×1 IMPLANT
KIT PV (KITS) ×3 IMPLANT
PERCLOSE PROGLIDE 6F (VASCULAR PRODUCTS) ×3
PROTECTION STATION PRESSURIZED (MISCELLANEOUS) ×3
SHEATH HIGHFLEX ANSEL 7FR 55CM (SHEATH) ×1 IMPLANT
SHEATH PINNACLE 5F 10CM (SHEATH) ×1 IMPLANT
SHEATH PINNACLE 7F 10CM (SHEATH) ×1 IMPLANT
SHEATH PROBE COVER 6X72 (BAG) ×1 IMPLANT
STATION PROTECTION PRESSURIZED (MISCELLANEOUS) IMPLANT
SYR CONTROL 10ML ANGIOGRAPHIC (SYRINGE) ×1 IMPLANT
SYR MEDRAD MARK 7 150ML (SYRINGE) ×3 IMPLANT
TAPE VIPERTRACK RADIOPAQ (MISCELLANEOUS) IMPLANT
TAPE VIPERTRACK RADIOPAQUE (MISCELLANEOUS) ×3
TORQUE DEVICE .014-.018 (MISCELLANEOUS) ×3
TRANSDUCER W/STOPCOCK (MISCELLANEOUS) ×3 IMPLANT
TRAY PV CATH (CUSTOM PROCEDURE TRAY) ×3 IMPLANT
TUBING INJECTOR 48 (MISCELLANEOUS) ×1 IMPLANT
WIRE HI TORQ VERSACORE-J 145CM (WIRE) ×1 IMPLANT

## 2019-08-16 NOTE — Progress Notes (Signed)
Ruben, Huang ([id number]) Visit Report for 08/15/2019 Chief Complaint Document Details Patient Name: Date of Service: [name] 08/15/2019 9:00 A M Medical Record Number: [id number] Patient Account Number: [id number] Date of Birth/Sex: Treating RN: [birth year] (58 y.o. Male) Baruch Gouty Primary Care Provider: [name] Other Clinician: Referring Provider: Treating Provider/Extender: Vira Blanco Weeks in Treatment: 0 Information Obtained from: Patient Chief Complaint Right foot ulcers Electronic Signature(s) Signed: 08/15/2019 9:34:22 AM By: Worthy Keeler PA-C Entered By: Worthy Keeler on 08/15/2019 09:34:22 -------------------------------------------------------------------------------- Debridement Details Patient Name: Date of Service: [name]. 08/15/2019 9:00 A M Medical Record Number: [id number] Patient Account Number: [id number] Date of Birth/Sex: Treating RN: [birth year] (58 y.o. Male) Baruch Gouty Primary Care Provider: [name] Other Clinician: Referring Provider: Treating Provider/Extender: Vira Blanco Weeks in Treatment: 0 Debridement Performed for Assessment: Wound #2 Right,Lateral,Dorsal Foot Performed By: Physician Worthy Keeler, PA Debridement Type: Debridement Severity of Tissue Pre Debridement: Fat layer exposed Level of Consciousness (Pre-procedure): Awake and Alert Pre-procedure Verification/Time Out Yes - 09:40 Taken: Start Time: 09:41 Pain Control: Lidocaine 5% topical ointment T Area Debrided (L x W): otal 0.6 (cm) x 0.6 (cm) = 0.36 (cm) Tissue and other material debrided: Viable, Non-Viable, Slough, Subcutaneous, Slough Level: Skin/Subcutaneous Tissue Debridement Description: Excisional Instrument: Curette Bleeding: Minimum Hemostasis Achieved: Pressure End Time: 09:45 Procedural Pain: 7 Post Procedural Pain: 3 Response to Treatment: Procedure was tolerated  well Level of Consciousness (Post- Awake and Alert procedure): Post Debridement Measurements of Total Wound Length: (cm) 0.6 Width: (cm) 0.6 Depth: (cm) 0.1 Volume: (cm) 0.028 Character of Wound/Ulcer Post Debridement: Improved Severity of Tissue Post Debridement: Fat layer exposed Post Procedure Diagnosis Same as Pre-procedure Electronic Signature(s) Signed: 08/15/2019 6:54:26 PM By: Worthy Keeler PA-C Signed: 08/16/2019 4:56:58 PM By: Baruch Gouty RN, BSN Entered By: Baruch Gouty on 08/15/2019 09:45:28 -------------------------------------------------------------------------------- Debridement Details Patient Name: Date of Service: Ruben Leavell J. 08/15/2019 9:00 A M Medical Record Number: [id number] Patient Account Number: [id number] Date of Birth/Sex: Treating RN: May 20, 1960 (58 y.o. Male) Baruch Gouty Primary Care Provider: [name] Other Clinician: Referring Provider: Treating Provider/Extender: Vira Blanco Weeks in Treatment: 0 Debridement Performed for Assessment: Wound #1 Right,Dorsal Foot Performed By: Physician Worthy Keeler, PA Debridement Type: Debridement Severity of Tissue Pre Debridement: Fat layer exposed Level of Consciousness (Pre-procedure): Awake and Alert Pre-procedure Verification/Time Out Yes - 09:40 Taken: Start Time: 09:41 Pain Control: Lidocaine 5% topical ointment T Area Debrided (L x W): otal 1.8 (cm) x 2 (cm) = 3.6 (cm) Tissue and other material debrided: Viable, Non-Viable, Slough, Subcutaneous, Slough Level: Skin/Subcutaneous Tissue Debridement Description: Excisional Instrument: Curette Bleeding: Minimum Hemostasis Achieved: Pressure End Time: 09:45 Procedural Pain: 7 Post Procedural Pain: 3 Response to Treatment: Procedure was tolerated well Level of Consciousness (Post- Awake and Alert procedure): Post Debridement Measurements of Total Wound Length: (cm) 1.8 Width: (cm) 2 Depth: (cm)  0.1 Volume: (cm) 0.283 Character of Wound/Ulcer Post Debridement: Improved Severity of Tissue Post Debridement: Fat layer exposed Post Procedure Diagnosis Same as Pre-procedure Electronic Signature(s) Signed: 08/15/2019 6:54:26 PM By: Worthy Keeler PA-C Signed: 08/16/2019 4:56:58 PM By: Baruch Gouty RN, BSN Entered By: Baruch Gouty on 08/15/2019 09:46:04 -------------------------------------------------------------------------------- HPI Details Patient Name: Date of Service: [name], Ruben Kalata J. 08/15/2019 9:00 A M Medical Record Number: [id number] Patient Account Number: [id number] Date of Birth/Sex: Treating RN: [birth year] (59 y.o.  Male) Baruch Gouty Primary Care Provider: [name] Other Clinician: Referring Provider: Treating Provider/Extender: Hazle Quant in Treatment: 0 History of Present Illness HPI Description: 08/15/2019 upon evaluation today patient presents for evaluation here in our clinic concerning issues that he is having with wounds on the dorsal surface of his right foot. This is something that was noted to occur after he was obtaining cortisone injections with [facility]. Subsequently he developed ulcerations he was then referred to Dr. Alvester Chou who performed arterial testing and subsequently found that the patient had poor arterial flow with an ABI of 0.6 and a TBI of 0.26. With that being said he has since on 07/16/2019 had an angiogram on the right with improvement of his ABI to 0.85 and TBI to 0.5. Obviously this is not perfect but is definitely far superior to where it was previous. He does have some necrotic tissue on the surface of the wounds is going to need debriding away he has been using Silvadene on this but to be honest that is not can to do anything for these wounds. He needs something more to clear this away sharp debridement is ideal and we may even look towards Santyl as well. The patient does have a history of  diabetes noted in his chart though is not on medication and his A1c was around 6.4 so is not terrible. With that being said he does have known peripheral vascular disease he is actually have an angiogram on the left tomorrow. He also has hypertension and he does smoke. Electronic Signature(s) Signed: 08/15/2019 9:50:26 AM By: Worthy Keeler PA-C Entered By: Worthy Keeler on 08/15/2019 09:50:26 -------------------------------------------------------------------------------- Physical Exam Details Patient Name: Date of Service: [name]. 08/15/2019 9:00 A M Medical Record Number: [id number] Patient Account Number: [id number] Date of Birth/Sex: Treating RN: [birth year] (58 y.o. Male) Baruch Gouty Primary Care Provider: [name] Other Clinician: Referring Provider: Treating Provider/Extender: Vira Blanco Weeks in Treatment: 0 Constitutional sitting or standing blood pressure is within target range for patient.. pulse regular and within target range for patient.Marland Kitchen respirations regular, non-labored and within target range for patient.Marland Kitchen temperature within target range for patient.. Well-nourished and well-hydrated in no acute distress. Eyes conjunctiva clear no eyelid edema noted. pupils equal round and reactive to light and accommodation. Ears, Nose, Mouth, and Throat no gross abnormality of ear auricles or external auditory canals. normal hearing noted during conversation. mucus membranes moist. Respiratory normal breathing without difficulty. Cardiovascular Absent posterior tibial and dorsalis pedis pulses bilateral lower extremities. no clubbing, cyanosis, significant edema, <3 sec cap refill. Musculoskeletal normal gait and posture. no significant deformity or arthritic changes, no loss or range of motion, no clubbing. Psychiatric this patient is able to make decisions and demonstrates good insight into disease process. Alert and Oriented x 3.  pleasant and cooperative. Notes Upon inspection patient's wounds did require sharp debridement to remove necrotic tissue from the surface of the wound. The patient actually tolerated the debridement today he did have some pain but fortunately post debridement wound bed appears to be doing much better which is excellent news and there is no signs of active infection at this time. No fevers, chills, nausea, vomiting, or diarrhea. Electronic Signature(s) Signed: 08/15/2019 9:51:04 AM By: Worthy Keeler PA-C Entered By: Worthy Keeler on 08/15/2019 09:51:03 -------------------------------------------------------------------------------- Physician Orders Details Patient Name: Date of Service: [name], Ruben Kalata J. 08/15/2019 9:00 A M Medical  Record Number: [id number] Patient Account Number: [id number] Date of Birth/Sex: Treating RN: Jun 12, 1960 (58 y.o. Male) Baruch Gouty Primary Care Provider: [name] Other Clinician: Referring Provider: Treating Provider/Extender: Vira Blanco Weeks in Treatment: 0 Verbal / Phone Orders: No Diagnosis Coding ICD-10 Coding Code Description I73.89 Other specified peripheral vascular diseases E11.621 Type 2 diabetes mellitus with foot ulcer L97.512 Non-pressure chronic ulcer of other part of right foot with fat layer exposed I10 Essential (primary) hypertension F17.218 Nicotine dependence, cigarettes, with other nicotine-induced disorders Follow-up Appointments Return Appointment in 1 week. Dressing Change Frequency Wound #1 Right,Dorsal Foot Change dressing every day. Wound #2 Right,Lateral,Dorsal Foot Change dressing every day. Wound Cleansing Wound #1 Right,Dorsal Foot May shower and wash wound with soap and water. Wound #2 Right,Lateral,Dorsal Foot May shower and wash wound with soap and water. Primary Wound Dressing Wound #1 Right,Dorsal Foot Santyl Ointment Wound #2 Right,Lateral,Dorsal Foot Santyl  Ointment Secondary Dressing Wound #1 Right,Dorsal Foot Kerlix/Rolled Gauze Dry Gauze Wound #2 Right,Lateral,Dorsal Foot Kerlix/Rolled Gauze Dry Gauze Patient Medications llergies: No Known Drug Allergies A Notifications Medication Indication Start End 08/15/2019 Santyl DOSE topical 250 unit/gram ointment - ointment topical apply nickel thick daily to the wound bed and then cover with a dressing as directed in clinic Electronic Signature(s) Signed: 08/15/2019 9:55:05 AM By: Worthy Keeler PA-C Entered By: Worthy Keeler on 08/15/2019 09:55:04 -------------------------------------------------------------------------------- Problem List Details Patient Name: Date of Service: Ruben Leavell J. 08/15/2019 9:00 A M Medical Record Number: [id number] Patient Account Number: [id number] Date of Birth/Sex: Treating RN: [birth year] (58 y.o. Male) Baruch Gouty Primary Care Provider: [name] Other Clinician: Referring Provider: Treating Provider/Extender: Vira Blanco Weeks in Treatment: 0 Active Problems ICD-10 Encounter Code Description Active Date MDM Diagnosis I73.89 Other specified peripheral vascular diseases 08/15/2019 No Yes E11.621 Type 2 diabetes mellitus with foot ulcer 08/15/2019 No Yes L97.512 Non-pressure chronic ulcer of other part of right foot with fat layer exposed 08/15/2019 No Yes I10 Essential (primary) hypertension 08/15/2019 No Yes F17.218 Nicotine dependence, cigarettes, with other nicotine-induced disorders 08/15/2019 No Yes Inactive Problems Resolved Problems Electronic Signature(s) Signed: 08/15/2019 9:33:51 AM By: Worthy Keeler PA-C Entered By: Worthy Keeler on 08/15/2019 09:33:50 -------------------------------------------------------------------------------- Progress Note Details Patient Name: Date of Service: Ruben Leavell J. 08/15/2019 9:00 A M Medical Record Number: [id number] Patient Account Number:  [id number] Date of Birth/Sex: Treating RN: [birth year] (58 y.o. Male) Baruch Gouty Primary Care Provider: [name] Other Clinician: Referring Provider: Treating Provider/Extender: Vira Blanco Weeks in Treatment: 0 Subjective Chief Complaint Information obtained from Patient Right foot ulcers History of Present Illness (HPI) 08/15/2019 upon evaluation today patient presents for evaluation here in our clinic concerning issues that he is having with wounds on the dorsal surface of his right foot. This is something that was noted to occur after he was obtaining cortisone injections with [facility]. Subsequently he developed ulcerations he was then referred to Dr. Alvester Chou who performed arterial testing and subsequently found that the patient had poor arterial flow with an ABI of 0.6 and a TBI of 0.26. With that being said he has since on 07/16/2019 had an angiogram on the right with improvement of his ABI to 0.85 and TBI to 0.5. Obviously this is not perfect but is definitely far superior to where it was previous. He does have some necrotic tissue on the surface of the wounds is going to need debriding  away he has been using Silvadene on this but to be honest that is not can to do anything for these wounds. He needs something more to clear this away sharp debridement is ideal and we may even look towards Santyl as well. The patient does have a history of diabetes noted in his chart though is not on medication and his A1c was around 6.4 so is not terrible. With that being said he does have known peripheral vascular disease he is actually have an angiogram on the left tomorrow. He also has hypertension and he does smoke. Patient History Information obtained from Patient. Allergies No Known Drug Allergies Family History Diabetes - Siblings, Heart Disease - Father, No family history of Cancer, Hereditary Spherocytosis, Hypertension, Kidney Disease, Lung Disease,  Seizures, Stroke, Thyroid Problems, Tuberculosis. Social History Current every day smoker - 1/2 pack per day, Marital Status - Separated, Alcohol Use - Moderate, Drug Use - No History, Caffeine Use - Rarely. Medical History Cardiovascular Patient has history of Hypertension, Peripheral Arterial Disease Endocrine Patient has history of Type II Diabetes Neurologic Patient has history of Neuropathy Patient is treated with Controlled Diet. Blood sugar is not tested. Medical A Surgical History Notes nd Cardiovascular Hyperlipidemia Review of Systems (ROS) Constitutional Symptoms (General Health) Denies complaints or symptoms of Fatigue, Fever, Chills, Marked Weight Change. Eyes Denies complaints or symptoms of Dry Eyes, Vision Changes, Glasses / Contacts. Ear/Nose/Mouth/Throat Denies complaints or symptoms of Chronic sinus problems or rhinitis. Respiratory Denies complaints or symptoms of Chronic or frequent coughs, Shortness of Breath. Gastrointestinal Denies complaints or symptoms of Frequent diarrhea, Nausea, Vomiting. Genitourinary Denies complaints or symptoms of Frequent urination. Integumentary (Skin) Complains or has symptoms of Wounds - wounds on right foot. Musculoskeletal Denies complaints or symptoms of Muscle Pain, Muscle Weakness. Psychiatric Denies complaints or symptoms of Claustrophobia, Suicidal. Objective Constitutional sitting or standing blood pressure is within target range for patient.. pulse regular and within target range for patient.Marland Kitchen respirations regular, non-labored and within target range for patient.Marland Kitchen temperature within target range for patient.. Well-nourished and well-hydrated in no acute distress. Vitals Time Taken: 9:00 AM, Height: 62 in, Source: Stated, Weight: 130 lbs, BMI: 23.8, Temperature: 98.4 F, Pulse: 75 bpm, Respiratory Rate: 16 breaths/min, Blood Pressure: 134/81 mmHg. Eyes conjunctiva clear no eyelid edema noted. pupils equal round  and reactive to light and accommodation. Ears, Nose, Mouth, and Throat no gross abnormality of ear auricles or external auditory canals. normal hearing noted during conversation. mucus membranes moist. Respiratory normal breathing without difficulty. Cardiovascular Absent posterior tibial and dorsalis pedis pulses bilateral lower extremities. no clubbing, cyanosis, significant edema, Musculoskeletal normal gait and posture. no significant deformity or arthritic changes, no loss or range of motion, no clubbing. Psychiatric this patient is able to make decisions and demonstrates good insight into disease process. Alert and Oriented x 3. pleasant and cooperative. General Notes: Upon inspection patient's wounds did require sharp debridement to remove necrotic tissue from the surface of the wound. The patient actually tolerated the debridement today he did have some pain but fortunately post debridement wound bed appears to be doing much better which is excellent news and there is no signs of active infection at this time. No fevers, chills, nausea, vomiting, or diarrhea. Integumentary (Hair, Skin) Wound #1 status is Open. Original cause of wound was Gradually Appeared. The wound is located on the Right,Dorsal Foot. The wound measures 1.8cm length x 2cm width x 0.1cm depth; 2.827cm^2 area and 0.283cm^3 volume. There is no tunneling or undermining  noted. There is a medium amount of serosanguineous drainage noted. The wound margin is flat and intact. There is no granulation within the wound bed. There is a large (67-100%) amount of necrotic tissue within the wound bed including Adherent Slough. Wound #2 status is Open. Original cause of wound was Gradually Appeared. The wound is located on the Right,Lateral,Dorsal Foot. The wound measures 0.6cm length x 0.6cm width x 0.1cm depth; 0.283cm^2 area and 0.028cm^3 volume. There is Fat Layer (Subcutaneous Tissue) Exposed exposed. There is a medium amount of  serosanguineous drainage noted. The wound margin is flat and intact. There is small (1-33%) pink granulation within the wound bed. There is a large (67-100%) amount of necrotic tissue within the wound bed including Adherent Slough. Assessment Active Problems ICD-10 Other specified peripheral vascular diseases Type 2 diabetes mellitus with foot ulcer Non-pressure chronic ulcer of other part of right foot with fat layer exposed Essential (primary) hypertension Nicotine dependence, cigarettes, with other nicotine-induced disorders Procedures Wound #1 Pre-procedure diagnosis of Wound #1 is a Diabetic Wound/Ulcer of the Lower Extremity located on the Right,Dorsal Foot .Severity of Tissue Pre Debridement is: Fat layer exposed. There was a Excisional Skin/Subcutaneous Tissue Debridement with a total area of 3.6 sq cm performed by Worthy Keeler, PA. With the following instrument(s): Curette to remove Viable and Non-Viable tissue/material. Material removed includes Subcutaneous Tissue and Slough and after achieving pain control using Lidocaine 5% topical ointment. No specimens were taken. A time out was conducted at 09:40, prior to the start of the procedure. A Minimum amount of bleeding was controlled with Pressure. The procedure was tolerated well with a pain level of 7 throughout and a pain level of 3 following the procedure. Post Debridement Measurements: 1.8cm length x 2cm width x 0.1cm depth; 0.283cm^3 volume. Character of Wound/Ulcer Post Debridement is improved. Severity of Tissue Post Debridement is: Fat layer exposed. Post procedure Diagnosis Wound #1: Same as Pre-Procedure Wound #2 Pre-procedure diagnosis of Wound #2 is a Diabetic Wound/Ulcer of the Lower Extremity located on the Right,Lateral,Dorsal Foot .Severity of Tissue Pre Debridement is: Fat layer exposed. There was a Excisional Skin/Subcutaneous Tissue Debridement with a total area of 0.36 sq cm performed by Worthy Keeler, PA.  With the following instrument(s): Curette to remove Viable and Non-Viable tissue/material. Material removed includes Subcutaneous Tissue and Slough and after achieving pain control using Lidocaine 5% topical ointment. No specimens were taken. A time out was conducted at 09:40, prior to the start of the procedure. A Minimum amount of bleeding was controlled with Pressure. The procedure was tolerated well with a pain level of 7 throughout and a pain level of 3 following the procedure. Post Debridement Measurements: 0.6cm length x 0.6cm width x 0.1cm depth; 0.028cm^3 volume. Character of Wound/Ulcer Post Debridement is improved. Severity of Tissue Post Debridement is: Fat layer exposed. Post procedure Diagnosis Wound #2: Same as Pre-Procedure Plan Follow-up Appointments: Return Appointment in 1 week. Dressing Change Frequency: Wound #1 Right,Dorsal Foot: Change dressing every day. Wound #2 Right,Lateral,Dorsal Foot: Change dressing every day. Wound Cleansing: Wound #1 Right,Dorsal Foot: May shower and wash wound with soap and water. Wound #2 Right,Lateral,Dorsal Foot: May shower and wash wound with soap and water. Primary Wound Dressing: Wound #1 Right,Dorsal Foot: Santyl Ointment Wound #2 Right,Lateral,Dorsal Foot: Santyl Ointment Secondary Dressing: Wound #1 Right,Dorsal Foot: Kerlix/Rolled Gauze Dry Gauze Wound #2 Right,Lateral,Dorsal Foot: Kerlix/Rolled Gauze Dry Gauze The following medication(s) was prescribed: Santyl topical 250 unit/gram ointment ointment topical apply nickel thick daily to  the wound bed and then cover with a dressing as directed in clinic starting 08/15/2019 1. I would recommend at this time that we go ahead and initiate treatment with Santyl I was able to remove a vast majority of the necrotic debris today and the patient tolerated that without complication. With that being said he did have some pain post debridement we did go ahead and reapply some  numbing medication today as well. 2. I am also can recommend that we go ahead and have the patient change this dressing out daily I think that is ideal. 3. With regard to the blood flow I think that he is sufficient at this point to likely be able to heal though not perfect. He did have some minimal bleeding noted with debridement today and the tissue looks well to me. We will see patient back for reevaluation in 1 week here in the clinic. If anything worsens or changes patient will contact our office for additional recommendations. Electronic Signature(s) Signed: 08/15/2019 9:55:13 AM By: Worthy Keeler PA-C Entered By: Worthy Keeler on 08/15/2019 09:55:13 -------------------------------------------------------------------------------- HxROS Details Patient Name: Date of Service: [name], [place]. 08/15/2019 9:00 A M Medical Record Number: [id number] Patient Account Number: [id number] Date of Birth/Sex: Treating RN: [birth year] (58 y.o. Male) Levan Hurst Primary Care Provider: [name] Other Clinician: Referring Provider: Treating Provider/Extender: Vira Blanco Weeks in Treatment: 0 Information Obtained From Patient Constitutional Symptoms (General Health) Complaints and Symptoms: Negative for: Fatigue; Fever; Chills; Marked Weight Change Eyes Complaints and Symptoms: Negative for: Dry Eyes; Vision Changes; Glasses / Contacts Ear/Nose/Mouth/Throat Complaints and Symptoms: Negative for: Chronic sinus problems or rhinitis Respiratory Complaints and Symptoms: Negative for: Chronic or frequent coughs; Shortness of Breath Gastrointestinal Complaints and Symptoms: Negative for: Frequent diarrhea; Nausea; Vomiting Genitourinary Complaints and Symptoms: Negative for: Frequent urination Integumentary (Skin) Complaints and Symptoms: Positive for: Wounds - wounds on right foot Musculoskeletal Complaints and Symptoms: Negative for: Muscle Pain;  Muscle Weakness Psychiatric Complaints and Symptoms: Negative for: Claustrophobia; Suicidal Hematologic/Lymphatic Cardiovascular Medical History: Positive for: Hypertension; Peripheral Arterial Disease Past Medical History Notes: Hyperlipidemia Endocrine Medical History: Positive for: Type II Diabetes Treated with: Diet Blood sugar tested every day: No Immunological Neurologic Medical History: Positive for: Neuropathy Oncologic Immunizations Pneumococcal Vaccine: Received Pneumococcal Vaccination: No Implantable Devices None Family and Social History Cancer: No; Diabetes: Yes - Siblings; Heart Disease: Yes - Father; Hereditary Spherocytosis: No; Hypertension: No; Kidney Disease: No; Lung Disease: No; Seizures: No; Stroke: No; Thyroid Problems: No; Tuberculosis: No; Current every day smoker - 1/2 pack per day; Marital Status - Separated; Alcohol Use: Moderate; Drug Use: No History; Caffeine Use: Rarely; Financial Concerns: No; Food, Clothing or Shelter Needs: No; Support System Lacking: No; Transportation Concerns: No Electronic Signature(s) Signed: 08/15/2019 6:54:26 PM By: Worthy Keeler PA-C Signed: 08/16/2019 5:02:14 PM By: Levan Hurst RN, BSN Entered By: Levan Hurst on 08/15/2019 09:29:14 -------------------------------------------------------------------------------- [place] Details Patient Name: Date of Service: [name] 08/15/2019 Medical Record Number: [id number] Patient Account Number: [id number] Date of Birth/Sex: Treating RN: 05-21-1960 (58 y.o. Male) Baruch Gouty Primary Care Provider: [name] Other Clinician: Referring Provider: Treating Provider/Extender: Vira Blanco Weeks in Treatment: 0 Diagnosis Coding ICD-10 Codes Code Description I73.89 Other specified peripheral vascular diseases E11.621 Type 2 diabetes mellitus with foot ulcer L97.512 Non-pressure chronic ulcer of other part of right foot with  fat layer exposed I10 Essential (primary) hypertension F17.218 Nicotine dependence, cigarettes,  with other nicotine-induced disorders Facility Procedures CPT4 Code: 65537482 Description: 70786 - WOUND CARE VISIT-LEV 3 EST PT Modifier: 25 Quantity: 1 CPT4 Code: 75449201 Description: 00712 - DEB SUBQ TISSUE 20 SQ CM/< ICD-10 Diagnosis Description L97.512 Non-pressure chronic ulcer of other part of right foot with fat layer exposed Modifier: Quantity: 1 Physician Procedures : CPT4 Code Description Modifier 1975883 99204 - WC PHYS LEVEL 4 - NEW PT 25 ICD-10 Diagnosis Description I73.89 Other specified peripheral vascular diseases E11.621 Type 2 diabetes mellitus with foot ulcer L97.512 Non-pressure chronic ulcer of other  part of right foot with fat layer exposed I10 Essential (primary) hypertension Quantity: 1 : [id number] 41583 - WC PHYS SUBQ TISS 20 SQ CM ICD-10 Diagnosis Description L97.512 Non-pressure chronic ulcer of other part of right foot with fat layer exposed Quantity: 1 Electronic Signature(s) Signed: 08/15/2019 9:55:27 AM By: Worthy Keeler PA-C Entered By: Worthy Keeler on 08/15/2019 09:55:26

## 2019-08-16 NOTE — Interval H&P Note (Signed)
History and Physical Interval Note:  08/16/2019 7:36 AM  Hall Busing [employer].  has presented today for surgery, with the diagnosis of ischemia of foot.  The various methods of treatment have been discussed with the patient and family. After consideration of risks, benefits and other options for treatment, the patient has consented to  Procedure(s): ABDOMINAL AORTOGRAM W/LOWER EXTREMITY (N/A) as a surgical intervention.  The patient's history has been reviewed, patient examined, no change in status, stable for surgery.  I have reviewed the patient's chart and labs.  Questions were answered to the patient's satisfaction.     Quay Burow

## 2019-08-16 NOTE — Progress Notes (Signed)
CONSUELO, THAYNE ([id number]) Visit Report for 08/15/2019 Allergy List Details Patient Name: Date of Service: [name] 08/15/2019 9:00 A M Medical Record Number: [id number] Patient Account Number: [id number] Date of Birth/Sex: Treating RN: July 18, 1960 (58 y.o. Male) Levan Hurst Primary Care Tarea Skillman: [name] Other Clinician: Referring Cambryn Charters: Treating Tonyia Marschall/Extender: Vira Blanco Weeks in Treatment: 0 Allergies Active Allergies No Known Drug Allergies Allergy Notes Electronic Signature(s) Signed: 08/16/2019 5:02:14 PM By: Levan Hurst RN, BSN Entered By: Levan Hurst on 08/15/2019 09:01:50 -------------------------------------------------------------------------------- Arrival Information Details Patient Name: Date of Service: Ruben Leavell J. 08/15/2019 9:00 A M Medical Record Number: [id number] Patient Account Number: [id number] Date of Birth/Sex: Treating RN: [birth year] (58 y.o. Male) Levan Hurst Primary Care Lydiann Bonifas: [name] Other Clinician: Referring Chanette Demo: Treating Mervil Wacker/Extender: Hazle Quant in Treatment: 0 Visit Information Patient Arrived: Ambulatory Arrival Time: 08:59 Accompanied By: alone Transfer Assistance: None Patient Identification Verified: Yes Secondary Verification Process Completed: Yes Patient Requires Transmission-Based Precautions: No Patient Has Alerts: Yes Patient Alerts: Patient on Blood Thinner R ABI: 0.85 R TBI: 0.50 Electronic Signature(s) Signed: 08/16/2019 5:02:14 PM By: Levan Hurst RN, BSN Entered By: Levan Hurst on 08/15/2019 09:01:21 -------------------------------------------------------------------------------- Clinic Level of Care Assessment Details Patient Name: Date of Service: [name], Ruben Axon. 08/15/2019 9:00 A M Medical Record Number: [id number] Patient Account Number: [id number] Date of Birth/Sex: Treating RN: [birth year]  (58 y.o. Male) Baruch Gouty Primary Care Carli Lefevers: [name] Other Clinician: Referring Emmalynn Pinkham: Treating Brodee Mauritz/Extender: Vira Blanco Weeks in Treatment: 0 Clinic Level of Care Assessment Items TOOL 1 Quantity Score []  - 0 Use when EandM and Procedure is performed on INITIAL visit ASSESSMENTS - Nursing Assessment / Reassessment X- 1 20 General Physical Exam (combine w/ comprehensive assessment (listed just below) when performed on Ruben pt. evals) X- 1 25 Comprehensive Assessment (HX, ROS, Risk Assessments, Wounds Hx, etc.) ASSESSMENTS - Wound and Skin Assessment / Reassessment []  - 0 Dermatologic / Skin Assessment (not related to wound area) ASSESSMENTS - Ostomy and/or Continence Assessment and Care []  - 0 Incontinence Assessment and Management []  - 0 Ostomy Care Assessment and Management (repouching, etc.) PROCESS - Coordination of Care X - Simple Patient / Family Education for ongoing care 1 15 []  - 0 Complex (extensive) Patient / Family Education for ongoing care X- 1 10 Staff obtains Programmer, systems, Records, T Results / Process Orders est []  - 0 Staff telephones HHA, Nursing Homes / Clarify orders / etc []  - 0 Routine Transfer to another Facility (non-emergent condition) []  - 0 Routine Hospital Admission (non-emergent condition) X- 1 15 Ruben Admissions / Biomedical engineer / Ordering NPWT Apligraf, etc. , []  - 0 Emergency Hospital Admission (emergent condition) PROCESS - Special Needs []  - 0 Pediatric / Minor Patient Management []  - 0 Isolation Patient Management []  - 0 Hearing / Language / Visual special needs []  - 0 Assessment of Community assistance (transportation, D/C planning, etc.) []  - 0 Additional assistance / Altered mentation []  - 0 Support Surface(s) Assessment (bed, cushion, seat, etc.) INTERVENTIONS - Miscellaneous []  - 0 External ear exam []  - 0 Patient Transfer (multiple staff / Civil Service fast streamer / Similar  devices) []  - 0 Simple Staple / Suture removal (25 or less) []  - 0 Complex Staple / Suture removal (26 or more) []  - 0 Hypo/Hyperglycemic Management (do not check if billed separately) []  - 0 Ankle / Brachial Index (ABI) - do  not check if billed separately Has the patient been seen at the hospital within the last three years: Yes Total Score: 85 Level Of Care: Ruben/Established - Level 3 Electronic Signature(s) Signed: 08/16/2019 4:56:58 PM By: Baruch Gouty RN, BSN Entered By: Baruch Gouty on 08/15/2019 09:43:51 -------------------------------------------------------------------------------- Encounter Discharge Information Details Patient Name: Date of Service: Ruben Leavell J. 08/15/2019 9:00 A M Medical Record Number: [id number] Patient Account Number: [id number] Date of Birth/Sex: Treating RN: [birth year] (58 y.o. Male) Baruch Gouty Primary Care Nasreen Goedecke: [name] Other Clinician: Referring Morton Simson: Treating Jahmel Flannagan/Extender: Vira Blanco Weeks in Treatment: 0 Encounter Discharge Information Items Post Procedure Vitals Discharge Condition: Stable Temperature (F): 98.4 Ambulatory Status: Ambulatory Pulse (bpm): 75 Discharge Destination: Home Respiratory Rate (breaths/min): 18 Transportation: Private Auto Blood Pressure (mmHg): 134/81 Accompanied By: self Schedule Follow-up Appointment: Yes Clinical Summary of Care: Patient Declined Electronic Signature(s) Signed: 08/16/2019 4:56:58 PM By: Baruch Gouty RN, BSN Entered By: Baruch Gouty on 08/15/2019 10:00:40 -------------------------------------------------------------------------------- Lower Extremity Assessment Details Patient Name: Date of Service: [name], [place]. 08/15/2019 9:00 A M Medical Record Number: [id number] Patient Account Number: [id number] Date of Birth/Sex: Treating RN: [birth year] (58 y.o. Male) Levan Hurst Primary Care Jodee Wagenaar: [name] Other  Clinician: Referring Lanna Labella: Treating Reyonna Haack/Extender: Vira Blanco Weeks in Treatment: 0 Edema Assessment Assessed: [Left: No] [Right: No] Edema: [Left: N] [Right: o] Calf Left: Right: Point of Measurement: 28 cm From Medial Instep cm 28 cm Ankle Left: Right: Point of Measurement: 9 cm From Medial Instep cm 18 cm Vascular Assessment Pulses: Dorsalis Pedis Palpable: [Right:Yes] Electronic Signature(s) Signed: 08/16/2019 5:02:14 PM By: Levan Hurst RN, BSN Entered By: Levan Hurst on 08/15/2019 09:22:51 -------------------------------------------------------------------------------- [place] Details Patient Name: Date of Service: [name], [place]. 08/15/2019 9:00 A M Medical Record Number: [id number] Patient Account Number: [id number] Date of Birth/Sex: Treating RN: 1960-04-14 (58 y.o. Male) Baruch Gouty Primary Care Quinci Gavidia: [name] Other Clinician: Referring Deontrey Massi: Treating Kytzia Gienger/Extender: Vira Blanco Weeks in Treatment: 0 Active Inactive Nutrition Nursing Diagnoses: Impaired glucose control: actual or potential Potential for alteratiion in Nutrition/Potential for imbalanced nutrition Goals: Patient/caregiver will maintain therapeutic glucose control Date Initiated: 08/15/2019 Target Resolution Date: 09/12/2019 Goal Status: Active Interventions: Assess HgA1c results as ordered upon admission and as needed Assess patient nutrition upon admission and as needed per policy Treatment Activities: Patient referred to Primary Care Physician for further nutritional evaluation : 08/15/2019 Notes: Tissue Oxygenation Nursing Diagnoses: Actual ineffective tissue perfusion; peripheral (select once diagnosis is confirmed) Knowledge deficit related to disease process and management Goals: Patient/caregiver will verbalize understanding of disease process and disease management Date Initiated:  08/15/2019 Target Resolution Date: 09/12/2019 Goal Status: Active Interventions: Assess patient understanding of disease process and management upon diagnosis and as needed Assess peripheral arterial status upon admission and as needed Provide education on tissue oxygenation and ischemia Treatment Activities: T ordered outside of clinic : 08/15/2019 est Notes: Wound/Skin Impairment Nursing Diagnoses: Impaired tissue integrity Knowledge deficit related to smoking impact on wound healing Knowledge deficit related to ulceration/compromised skin integrity Goals: Patient will demonstrate a reduced rate of smoking or cessation of smoking Date Initiated: 08/15/2019 Target Resolution Date: 09/12/2019 Goal Status: Active Patient/caregiver will verbalize understanding of skin care regimen Date Initiated: 08/15/2019 Target Resolution Date: 09/12/2019 Goal Status: Active Ulcer/skin breakdown will have a volume reduction of 30% by week 4 Date Initiated: 08/15/2019 Target Resolution Date: 09/12/2019 Goal Status: Active  Interventions: Assess patient/caregiver ability to obtain necessary supplies Assess patient/caregiver ability to perform ulcer/skin care regimen upon admission and as needed Assess ulceration(s) every visit Provide education on ulcer and skin care Treatment Activities: Skin care regimen initiated : 08/15/2019 Topical wound management initiated : 08/15/2019 Notes: Electronic Signature(s) Signed: 08/16/2019 4:56:58 PM By: Baruch Gouty RN, BSN Entered By: Baruch Gouty on 08/15/2019 09:41:59 -------------------------------------------------------------------------------- Pain Assessment Details Patient Name: Date of Service: [name]. 08/15/2019 9:00 A M Medical Record Number: [id number] Patient Account Number: [id number] Date of Birth/Sex: Treating RN: 09-12-1960 (58 y.o. Male) Levan Hurst Primary Care Dimas Scheck: [name] Other Clinician: Referring  Jaylene Arrowood: Treating Ayauna Mcnay/Extender: Vira Blanco Weeks in Treatment: 0 Active Problems Location of Pain Severity and Description of Pain Patient Has Paino Yes Site Locations Pain Location: Pain in Ulcers With Dressing Change: Yes Duration of the Pain. Constant / Intermittento Intermittent Rate the pain. Current Pain Level: 8 Worst Pain Level: 10 Least Pain Level: 0 Character of Pain Describe the Pain: Throbbing Pain Management and Medication Current Pain Management: Medication: Yes Cold Application: No Rest: No Massage: No Activity: No T.E.N.S.: No Heat Application: No Leg drop or elevation: No Is the Current Pain Management Adequate: Inadequate How does your wound impact your activities of daily livingo Sleep: No Bathing: No Appetite: No Relationship With Others: No Bladder Continence: No Emotions: No Bowel Continence: No Work: No Toileting: No Drive: No Dressing: No Hobbies: No Electronic Signature(s) Signed: 08/16/2019 5:02:14 PM By: Levan Hurst RN, BSN Entered By: Levan Hurst on 08/15/2019 09:25:46 -------------------------------------------------------------------------------- Patient/Caregiver Education Details Patient Name: Date of Service: [name] 7/21/2021andnbsp9:00 A M Medical Record Number: [id number] Patient Account Number: [id number] Date of Birth/Gender: Treating RN: Sep 13, 1960 (58 y.o. Male) Baruch Gouty Primary Care Physician: [name] Other Clinician: Referring Physician: Treating Physician/Extender: Vira Blanco Weeks in Treatment: 0 Education Assessment Education Provided To: Patient Education Topics Provided Tissue Oxygenation: Handouts: Peripheral Arterial Disease and Related Ulcers Methods: Explain/Verbal, Printed Responses: Reinforcements needed, State content correctly Wound Debridement: Handouts: Wound Debridement Methods: Explain/Verbal,  Printed Responses: Reinforcements needed, State content correctly Wound/Skin Impairment: Handouts: Caring for Your Ulcer, Skin Care Do's and Dont's, Smoking and Wound Healing Methods: Explain/Verbal, Printed Responses: Reinforcements needed, State content correctly Electronic Signature(s) Signed: 08/16/2019 4:56:58 PM By: Baruch Gouty RN, BSN Entered By: Baruch Gouty on 08/15/2019 09:42:43 -------------------------------------------------------------------------------- Wound Assessment Details Patient Name: Date of Service: [name]. 08/15/2019 9:00 A M Medical Record Number: [id number] Patient Account Number: [id number] Date of Birth/Sex: Treating RN: 1960-02-16 (58 y.o. Male) Levan Hurst Primary Care Taylormarie Register: [name] Other Clinician: Referring Shirley Bolle: Treating Ayodele Sangalang/Extender: Vira Blanco Weeks in Treatment: 0 Wound Status Wound Number: 1 Primary Etiology: Diabetic Wound/Ulcer of the Lower Extremity Wound Location: Right, Dorsal Foot Secondary Etiology: Arterial Insufficiency Ulcer Wounding Event: Gradually Appeared Wound Status: Open Date Acquired: 03/26/2019 Comorbid History: Hypertension, Type II Diabetes, Neuropathy Weeks Of Treatment: 0 Clustered Wound: No Photos Photo Uploaded By: [name] on 08/15/2019 12:39:16 Wound Measurements Length: (cm) 1.8 Width: (cm) 2 Depth: (cm) 0.1 Area: (cm) 2.827 Volume: (cm) 0.283 % Reduction in Area: 0% % Reduction in Volume: 0% Epithelialization: None Tunneling: No Undermining: No Wound Description Classification: Unable to visualize wound bed Wound Margin: Flat and Intact Exudate Amount: Medium Exudate Type: Serosanguineous Exudate Color: red, brown Foul Odor After Cleansing: No Slough/Fibrino Yes Wound Bed Granulation Amount: None Present (0%) Exposed  Structure Necrotic Amount: Large (67-100%) Fascia Exposed: No Necrotic Quality: Adherent Slough Fat Layer  (Subcutaneous Tissue) Exposed: No Tendon Exposed: No Muscle Exposed: No Joint Exposed: No Bone Exposed: No Treatment Notes Wound #1 (Right, Dorsal Foot) 3. Primary Dressing Applied Santyl 4. Secondary Dressing Dry Gauze Roll Gauze 5. Secured With Recruitment consultant) Signed: 08/16/2019 5:02:14 PM By: Levan Hurst RN, BSN Entered By: Levan Hurst on 08/15/2019 09:25:14 -------------------------------------------------------------------------------- Wound Assessment Details Patient Name: Date of Service: [name]. 08/15/2019 9:00 A M Medical Record Number: [id number] Patient Account Number: [id number] Date of Birth/Sex: Treating RN: 1960/03/29 (58 y.o. Male) Levan Hurst Primary Care Amory Simonetti: [name] Other Clinician: Referring Teasia Zapf: Treating Raquell Richer/Extender: Vira Blanco Weeks in Treatment: 0 Wound Status Wound Number: 2 Primary Etiology: Diabetic Wound/Ulcer of the Lower Extremity Wound Location: Right, Lateral, Dorsal Foot Secondary Etiology: Arterial Insufficiency Ulcer Wounding Event: Gradually Appeared Wound Status: Open Date Acquired: 03/26/2019 Comorbid History: Hypertension, Type II Diabetes, Neuropathy Weeks Of Treatment: 0 Clustered Wound: No Photos Photo Uploaded By: [name] on 08/15/2019 13:00:10 Wound Measurements Length: (cm) Width: (cm) Depth: (cm) Area: (cm) Volume: (cm) 0.6 % Reduction in Area: 0.6 % Reduction in Volume: 0.1 0.283 0.028 Wound Description Classification: Grade 2 Wound Margin: Flat and Intact Exudate Amount: Medium Exudate Type: Serosanguineous Exudate Color: red, brown Foul Odor After Cleansing: No Slough/Fibrino Yes Wound Bed Granulation Amount: Small (1-33%) Exposed Structure Granulation Quality: Pink Fascia Exposed: No Necrotic Amount: Large (67-100%) Fat Layer (Subcutaneous Tissue) Exposed: Yes Necrotic Quality: Adherent Slough Tendon Exposed:  No Muscle Exposed: No Joint Exposed: No Bone Exposed: No Treatment Notes Wound #2 (Right, Lateral, Dorsal Foot) 3. Primary Dressing Applied Santyl 4. Secondary Dressing Dry Gauze Roll Gauze 5. Secured With Recruitment consultant) Signed: 08/16/2019 5:02:14 PM By: Levan Hurst RN, BSN Entered By: Levan Hurst on 08/15/2019 09:24:57 -------------------------------------------------------------------------------- Vitals Details Patient Name: Date of Service: [name], [place]. 08/15/2019 9:00 A M Medical Record Number: [id number] Patient Account Number: [id number] Date of Birth/Sex: Treating RN: 08/11/1960 (58 y.o. Male) Levan Hurst Primary Care Enrrique Mierzwa: Other Clinician: [name] Referring Quinetta Shilling: Treating Cystal Shannahan/Extender: Vira Blanco Weeks in Treatment: 0 Vital Signs Time Taken: 09:00 Temperature (F): 98.4 Height (in): 62 Pulse (bpm): 75 Source: Stated Respiratory Rate (breaths/min): 16 Weight (lbs): 130 Blood Pressure (mmHg): 134/81 Body Mass Index (BMI): 23.8 Reference Range: 80 - 120 mg / dl Electronic Signature(s) Signed: 08/16/2019 5:02:14 PM By: Levan Hurst RN, BSN Entered By: Levan Hurst on 08/15/2019 09:01:41

## 2019-08-16 NOTE — Progress Notes (Signed)
[name], [name] ([id number]) Visit Report for 08/15/2019 Abuse/Suicide Risk Screen Details Patient Name: Date of Service: Rowley RD, [name] 08/15/2019 9:00 A M Medical Record Number: [id number] Patient Account Number: [id number] Date of Birth/Sex: Treating RN: [birth year] (59 y.o. Male) Levan Hurst Primary Care Sheamus Hasting: [name] Other Clinician: Referring Gumecindo Hopkin: Treating Gillie Crisci/Extender: Vira Blanco Weeks in Treatment: 0 Abuse/Suicide Risk Screen Items Answer ABUSE RISK SCREEN: Has anyone close to you tried to hurt or harm you recentlyo No Do you feel uncomfortable with anyone in your familyo No Has anyone forced you do things that you didnt want to doo No Electronic Signature(s) Signed: 08/16/2019 5:02:14 PM By: Levan Hurst RN, BSN Entered By: Levan Hurst on 08/15/2019 09:19:36 -------------------------------------------------------------------------------- Activities of Daily Living Details Patient Name: Date of Service: Ruben, [name] 08/15/2019 9:00 A M Medical Record Number: [id number] Patient Account Number: [id number] Date of Birth/Sex: Treating RN: February 27, 1960 (59 y.o. Male) Levan Hurst Primary Care Omran Keelin: [name] Other Clinician: Referring Hatsue Sime: Treating Lakeria Starkman/Extender: Vira Blanco Weeks in Treatment: 0 Activities of Daily Living Items Answer Activities of Daily Living (Please select one for each item) Drive Automobile Completely Able T Medications ake Completely Able Use T elephone Completely Able Care for Appearance Completely Able Use T oilet Completely Able Bath / Shower Completely Able Dress Self Completely Able Feed Self Completely Able Walk Completely Able Get In / Out Bed Completely Able Housework Completely Able Prepare Meals Completely [place] for Self Completely Able Electronic Signature(s) Signed: 08/16/2019 5:02:14 PM By:  Levan Hurst RN, BSN Entered By: Levan Hurst on 08/15/2019 09:19:52 -------------------------------------------------------------------------------- Education Screening Details Patient Name: Date of Service: Ruben Leavell J. 08/15/2019 9:00 A M Medical Record Number: [id number] Patient Account Number: [id number] Date of Birth/Sex: Treating RN: Jun 12, 1960 (59 y.o. Male) Levan Hurst Primary Care Tekoa Amon: [name] Other Clinician: Referring Deola Rewis: Treating Aurelius Gildersleeve/Extender: Hazle Quant in Treatment: 0 Primary Learner Assessed: Patient Learning Preferences/Education Level/Primary Language Learning Preference: Explanation, Demonstration, Printed Material Highest Education Level: High School Preferred Language: English Cognitive Barrier Language Barrier: No Translator Needed: No Memory Deficit: No Emotional Barrier: No Cultural/Religious Beliefs Affecting Medical Care: No Physical Barrier Impaired Vision: No Impaired Hearing: No Decreased Hand dexterity: No Knowledge/Comprehension Knowledge Level: High Comprehension Level: High Ability to understand written instructions: High Ability to understand verbal instructions: High Motivation Anxiety Level: Calm Cooperation: Cooperative Education Importance: Acknowledges Need Interest in Health Problems: Asks Questions Perception: Coherent Willingness to Engage in Self-Management High Activities: Readiness to Engage in Self-Management High Activities: Electronic Signature(s) Signed: 08/16/2019 5:02:14 PM By: Levan Hurst RN, BSN Entered By: Levan Hurst on 08/15/2019 09:20:13 -------------------------------------------------------------------------------- Fall Risk Assessment Details Patient Name: Date of Service: Ruben Hahn RD, [name] 08/15/2019 9:00 A M Medical Record Number: [id number] Patient Account Number: [id number] Date of Birth/Sex: Treating RN: 1960/08/15 (59 y.o.  Male) Levan Hurst Primary Care Linzey Ramser: [name] Other Clinician: Referring Ariell Gunnels: Treating Adil Tugwell/Extender: Vira Blanco Weeks in Treatment: 0 Fall Risk Assessment Items Have you had 2 or more falls in the last 12 monthso 0 No Have you had any fall that resulted in injury in the last 12 monthso 0 No FALLS RISK SCREEN History of falling - immediate or within 3 months 0 No Secondary diagnosis (Do you have 2 or more medical diagnoseso) 15 Yes Ambulatory aid None/bed rest/wheelchair/nurse 0 Yes Crutches/cane/walker 0 No  Furniture 0 No Intravenous therapy Access/Saline/Heparin Lock 0 No Gait/Transferring Normal/ bed rest/ wheelchair 0 Yes Weak (short steps with or without shuffle, stooped but able to lift head while walking, may seek 0 No support from furniture) Impaired (short steps with shuffle, may have difficulty arising from chair, head down, impaired 0 No balance) Mental Status Oriented to own ability 0 Yes Electronic Signature(s) Signed: 08/16/2019 5:02:14 PM By: Levan Hurst RN, BSN Entered By: Levan Hurst on 08/15/2019 09:20:43 -------------------------------------------------------------------------------- Foot Assessment Details Patient Name: Date of Service: [name]. 08/15/2019 9:00 A M Medical Record Number: [id number] Patient Account Number: [id number] Date of Birth/Sex: Treating RN: 1960/07/26 (59 y.o. Male) Levan Hurst Primary Care Rithik Odea: [name] Other Clinician: Referring Jericca Russett: Treating Adarsh Mundorf/Extender: Vira Blanco Weeks in Treatment: 0 Foot Assessment Items Site Locations + = Sensation present, - = Sensation absent, C = Callus, U = Ulcer R = Redness, W = Warmth, M = Maceration, PU = Pre-ulcerative lesion F = Fissure, S = Swelling, D = Dryness Assessment Right: Left: Other Deformity: No No Prior Foot Ulcer: No No Prior Amputation: No No Charcot Joint: No  No Ambulatory Status: Ambulatory Without Help Gait: Steady Electronic Signature(s) Signed: 08/16/2019 5:02:14 PM By: Levan Hurst RN, BSN Entered By: Levan Hurst on 08/15/2019 09:22:26 -------------------------------------------------------------------------------- Nutrition Risk Screening Details Patient Name: Date of Service: [name]. 08/15/2019 9:00 A M Medical Record Number: [id number] Patient Account Number: [id number] Date of Birth/Sex: Treating RN: [birth year] (58 y.o. Male) Levan Hurst Primary Care Locke Barrell: [name] Other Clinician: Referring Gerasimos Plotts: Treating Jamille Fisher/Extender: Vira Blanco Weeks in Treatment: 0 Height (in): 62 Weight (lbs): 130 Body Mass Index (BMI): 23.8 Nutrition Risk Screening Items Score Screening NUTRITION RISK SCREEN: I have an illness or condition that made me change the kind and/or amount of food I eat 2 Yes I eat fewer than two meals per day 0 No I eat few fruits and vegetables, or milk products 0 No I have three or more drinks of beer, liquor or wine almost every day 0 No I have tooth or mouth problems that make it hard for me to eat 0 No I don't always have enough money to buy the food I need 0 No I eat alone most of the time 0 No I take three or more different prescribed or over-the-counter drugs a day 1 Yes Without wanting to, I have lost or gained 10 pounds in the last six months 2 Yes I am not always physically able to shop, cook and/or feed myself 0 No Nutrition Protocols Good Risk Protocol Moderate Risk Protocol 0 Provide education on nutrition High Risk Proctocol Risk Level: Moderate Risk Score: 5 Electronic Signature(s) Signed: 08/16/2019 5:02:14 PM By: Levan Hurst RN, BSN Entered By: Levan Hurst on 08/15/2019 09:20:57

## 2019-08-17 ENCOUNTER — Ambulatory Visit (HOSPITAL_COMMUNITY): Payer: Medicare Other

## 2019-08-17 DIAGNOSIS — D62 Acute posthemorrhagic anemia: Secondary | ICD-10-CM | POA: Diagnosis not present

## 2019-08-17 DIAGNOSIS — I739 Peripheral vascular disease, unspecified: Secondary | ICD-10-CM

## 2019-08-17 DIAGNOSIS — I998 Other disorder of circulatory system: Secondary | ICD-10-CM

## 2019-08-17 DIAGNOSIS — E1151 Type 2 diabetes mellitus with diabetic peripheral angiopathy without gangrene: Secondary | ICD-10-CM | POA: Diagnosis not present

## 2019-08-17 LAB — CBC
HCT: 31.6 % — ABNORMAL LOW (ref 39.0–52.0)
Hemoglobin: 10.1 g/dL — ABNORMAL LOW (ref 13.0–17.0)
MCH: 29.6 pg (ref 26.0–34.0)
MCHC: 32 g/dL (ref 30.0–36.0)
MCV: 92.7 fL (ref 80.0–100.0)
Platelets: 252 10*3/uL (ref 150–400)
RBC: 3.41 MIL/uL — ABNORMAL LOW (ref 4.22–5.81)
RDW: 15.1 % (ref 11.5–15.5)
WBC: 6.2 10*3/uL (ref 4.0–10.5)
nRBC: 0 % (ref 0.0–0.2)

## 2019-08-17 LAB — BASIC METABOLIC PANEL
Anion gap: 7 (ref 5–15)
BUN: 16 mg/dL (ref 6–20)
CO2: 23 mmol/L (ref 22–32)
Calcium: 8.9 mg/dL (ref 8.9–10.3)
Chloride: 107 mmol/L (ref 98–111)
Creatinine, Ser: 1.03 mg/dL (ref 0.61–1.24)
GFR calc Af Amer: >60 mL/min (ref >60)
GFR calc non Af Amer: >60 mL/min (ref >60)
Glucose, Bld: 101 mg/dL — ABNORMAL HIGH (ref 70–99)
Potassium: 3.9 mmol/L (ref 3.5–5.1)
Sodium: 137 mmol/L (ref 135–145)

## 2019-08-17 LAB — HEMOGLOBIN AND HEMATOCRIT, BLOOD
HCT: 32.3 % — ABNORMAL LOW (ref 39.0–52.0)
Hemoglobin: 10.2 g/dL — ABNORMAL LOW (ref 13.0–17.0)

## 2019-08-17 MED ORDER — COLLAGENASE 250 UNIT/GM EX OINT
TOPICAL_OINTMENT | Freq: Every day | CUTANEOUS | Status: DC
  Filled 2019-08-17: qty 30

## 2019-08-17 NOTE — Consult Note (Signed)
[facility] Nurse Consult Note: Reason for Consult:Right dorsal foot with nonhealing wounds.  Vascular insufficiency present.  Seen at wound care center with recommendations for santyl.  Will implement the orders recommended 08/15/19. Wound type:nonhealing vascular  Pressure Injury POA: NA Measurement: 3 open areas:  0.5 cm round 1 cm round and 0.3 cm round Wound bed: slough to wound bed.  Drainage (amount, consistency, odor) dry Periwound:intact Dressing procedure/placement/frequency: Cleanse wounds to right foot with Ns and pat dry.  Apply santyl to wounds.  Cover with NS moist gauze and secure with dry dressing and kerlix/tape.  Change daily.  Will not follow at this time.  Please re-consult if needed.  Domenic Moras MSN, RN, FNP-BC CWON Wound, Ostomy, Continence Nurse Pager [phone]

## 2019-08-17 NOTE — Discharge Instructions (Signed)
Follow-up with Wound Care specialist as directed at last visit.  Groin Site Care: Refer to this sheet in the next few weeks. These instructions provide you with information on caring for yourself after your procedure. Your caregiver may also give you more specific instructions. Your treatment has been planned according to current medical practices, but problems sometimes occur. Call your caregiver if you have any problems or questions after your procedure. HOME CARE INSTRUCTIONS  You may shower 24 hours after the procedure. Remove the bandage (dressing) and gently wash the site with plain soap and water. Gently pat the site dry.   Do not apply powder or lotion to the site.   Do not sit in a bathtub, swimming pool, or whirlpool for 5 to 7 days.   No bending, squatting, or lifting anything over 10 pounds (4.5 kg) as directed by your caregiver.   Inspect the site at least twice daily.   Do not drive home if you are discharged the same day of the procedure. Have someone else drive you.   You may drive 24 hours after the procedure unless otherwise instructed by your caregiver.  What to expect:  Any bruising will usually fade within 1 to 2 weeks.   Blood that collects in the tissue (hematoma) may be painful to the touch. It should usually decrease in size and tenderness within 1 to 2 weeks.  SEEK IMMEDIATE MEDICAL CARE IF:  You have unusual pain at the groin site or down the affected leg.   You have redness, warmth, swelling, or pain at the groin site.   You have drainage (other than a small amount of blood on the dressing).   You have chills.   You have a fever or persistent symptoms for more than 72 hours.   You have a fever and your symptoms suddenly get worse.   Your leg becomes pale, cool, tingly, or numb.   You have heavy bleeding from the site. Hold pressure on the site.

## 2019-08-17 NOTE — Progress Notes (Signed)
Progress Note  Patient Name: [name]. Date of Encounter: 08/17/2019  [facility] Cardiologist: Ruben Field, MD   Subjective   No acute overnight events. Patient's main complaint is pain of right foots wounds. Requesting pain medications. No chest pain or shortness of breath. Mild right femoral cath site tenderness with palpation. Hemoglobin down from 13.6 to 10.1.  Inpatient Medications    Scheduled Meds: . aspirin EC  81 mg Oral Daily  . atorvastatin  80 mg Oral Daily  . clopidogrel  75 mg Oral Q breakfast  . doxycycline  100 mg Oral BID  . gabapentin  200 mg Oral TID  . lisinopril  20 mg Oral Daily   And  . hydrochlorothiazide  25 mg Oral Daily  . sodium chloride flush  3 mL Intravenous Q12H  . sodium chloride flush  3 mL Intravenous Q12H   Continuous Infusions: . sodium chloride     PRN Meds: sodium chloride, acetaminophen, hydrALAZINE, labetalol, morphine injection, ondansetron (ZOFRAN) IV, sodium chloride flush   Vital Signs    Vitals:   08/16/19 2046 08/16/19 2332 08/17/19 0333 08/17/19 0758  BP: (!) 104/61 106/73 106/76 105/66  Pulse: 86 67 58 80  Resp: 17 15 15 16   Temp: 98.1 F (36.7 C) 98 F (36.7 C) 98 F (36.7 C) 98 F (36.7 C)  TempSrc: Oral Oral Oral Oral  SpO2: 100% 96% 100% 100%  Weight:   48.5 kg   Height:        Intake/Output Summary (Last 24 hours) at 08/17/2019 0905 Last data filed at 08/17/2019 0758 Gross per 24 hour  Intake 418 ml  Output 850 ml  Net -432 ml   Last 3 Weights 08/17/2019 08/16/2019 07/31/2019  Weight (lbs) 106 lb 14.4 oz 110 lb 105 lb 12.8 oz  Weight (kg) 48.49 kg 49.896 kg 47.991 kg      Telemetry    Normal sinus rhythm with rates mostly in the 70's to 90's.  - Personally Reviewed  ECG    No new ECG tracing today. - Personally Reviewed  Physical Exam   GEN: No acute distress.   Neck: Supple. Cardiac: RRR. Possible soft systolic murmur. No rubs or gallops. Right femoral cath site with  ecchymosis but mild hematoma but mostly soft. No bruit. Not pulsatile. Respiratory: Clear to auscultation bilaterally. GI: Soft, non-tender, non-distended  MS: No edema. No deformity. Skin: 3 non-healing wounds on ventral side of right foot. Neuro:  Nonfocal  Psych: Normal affect   Labs    High Sensitivity Troponin:  No results for input(s): TROPONINIHS in the last 720 hours.    Chemistry Recent Labs  Lab 08/16/19 1203 08/17/19 0540  NA 138 137  K 3.9 3.9  CL 105 107  CO2 26 23  GLUCOSE 160* 101*  BUN 23* 16  CREATININE 1.18 1.03  CALCIUM 9.0 8.9  GFRNONAA >60 >60  GFRAA >60 >60  ANIONGAP 7 7     Hematology Recent Labs  Lab 08/17/19 0540  WBC 6.2  RBC 3.41*  HGB 10.1*  HCT 31.6*  MCV 92.7  MCH 29.6  MCHC 32.0  RDW 15.1  PLT 252    BNPNo results for input(s): BNP, PROBNP in the last 168 hours.   DDimer No results for input(s): DDIMER in the last 168 hours.   Radiology    PERIPHERAL VASCULAR CATHETERIZATION  Result Date: 08/16/2019  [id number] LOCATION:  FACILITY: [facility] PHYSICIAN: [name], M.D. [birth year] DATE OF PROCEDURE:  08/16/2019  DATE OF DISCHARGE: PV Angiogram/Intervention History obtained from chart review.Ruben J [employer]. is a 59 y.o.  thin and chronically ill-appearing separated African-American male with no children who does not work because of being disabled. Is referred by Dr. Lesia Huang me for evaluation treatment of critical limb ischemia.   I last saw him in the office 07/10/2019.  The patient was initially referred to our practice by Dr. Daylene Huang, podiatry. His risk factors include 20 to 30 pack years of tobacco use having smoked for last 45 years, treated hypertension and hyperlipidemia. He is never had a heart attack or stroke. He denies chest pain or shortness of breath. He apparently had 3 cortisone shots in his right foot 4 to 5 months ago which resulted in wounds at the injection sites which have not healed. Also complains of  claudication bilaterally. Recent Dopplers performed today revealed a right ABI of 0.60 and a left of 0.45 with occluded SFAs bilaterally. He does have CKD 3 with serum creatinine in the mid 1 range.  I performed peripheral angiography on him 07/16/2019 revealing occluded SFAs bilaterally with one-vessel runoff.  I performed directional atherectomy followed by drug-coated balloon angioplasty of his right SFA.  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  His right ABI increased from 0.60-0.85.  The wounds on his right foot are still painful and still have not yet to heal.  Because of ongoing claudication in the left leg he wishes to proceed with left SFA intervention for lifestyle limiting claudication. Pre Procedure Diagnosis: Peripheral arterial disease Post Procedure Diagnosis: Peripheral arterial disease Operators: Dr. Quay Huang Procedures Performed:  1.  Ultrasound-guided right common femoral access  2.  Contralateral access (second order catheter placement)  3.  Failed attempt at left SFA CTO crossing  4.  Partial failure of right SFA.  Perclose PROCEDURE DESCRIPTION: The patient was brought to the second floor  Cardiac cath lab in the the postabsorptive state. He was premedicated with IV Versed and fentanyl. His right groin was prepped and shaved in usual sterile fashion. Xylocaine 1% was used for local anesthesia. A 5 French sheath was inserted into the right common femoral artery using standard Seldinger technique.  Ultrasound was used to identify the right common femoral artery and obtain access.  Digital image was captured and placed the patient's chart.  A crossover catheter was used to obtain contralateral access (secondary catheter placement).  A 7 French 55 cm multipurpose Ansell sheath was then advanced over a 0.35 versa core wire into the distal right external iliac artery.  Patient received a total of 6000 use of heparin with an ACT of 329.  Total contrast  administered to the patient was 200 cc. Using a Viance crossing catheter along with a 6 g Zilient wire followed by an 018 Nava cross endhole catheter, and a 12 g Zilient wire I crossed the proximal But was never able to reenter the true distal lumen.  I ultimately aborted the procedure.  I then performed a right right common femoral angiogram.  I also angiogram the right tibioperoneal trunk demonstrating it to be widely patent compared to the end of the previous procedure suspecting it was spasm related.  I deployed the Perclose device with only partial success.  The patient required a manual hold for 15 to 20 minutes.   Unsuccessful attempt at left SFA CTO recanalization due to failure to reenter the true lumen at the distal.  Given the partial failure  of the Perclose I am going to keep the patient overnight for observation.  We will continue DAPT.  Discharged home the morning.  Follow-up with me in 2 weeks.  He left lab in stable condition. [name]. MD, [facility] 08/16/2019 9:02 AM    Cardiac Studies   Abdominal Aortogram with Lower Extremity 08/16/2019: Procedure Description: The patient was brought to the second floor [facility] Cardiac cath lab in the the postabsorptive state. Hewas premedicated with IV Versed and fentanyl. Hisright groin was prepped and shaved in usual sterile fashion. Xylocaine 1% was used for local anesthesia. A 5 French sheath was inserted into the right common femoral artery using standard Seldinger technique. Ultrasound was used to identify the right common femoral artery and obtain access. Digital image was captured and placed the patient's chart. A crossover catheter was used to obtain contralateral access (secondary catheter placement). A 7 French 55 cm multipurpose Ansell sheath was then advanced over a 0.35 versa core wire into the distal right external iliac artery. Patient received a total of 6000 use of heparin with an ACT of 329. Total contrast administered to  the patient was 200 cc.  Using a Viance crossing catheter along with a 6 g Zilient wire followed by an 018 Nava cross endhole catheter, and a 12 g Zilient wire I crossed the proximal But was never able to reenter the true distal lumen. I ultimately aborted the procedure. I then performed a right right common femoral angiogram. I also angiogram the right tibioperoneal trunk demonstrating it to be widely patent compared to the end of the previous procedure suspecting it was spasm related. I deployed the Perclose device with only partial success. The patient required a manual hold for 15 to 20 minutes.   Impressions: Unsuccessful attempt at left SFA CTO recanalization due to failure to reenter the true lumen at the distal. Given the partial failure of the Perclose I am going to keep the patient overnight for observation. We will continue DAPT. Discharged home the morning. Follow-up with me in 2 weeks. He left lab in stable condition.  Patient Profile     59 y.o. male history of PAD s/p direction atherectomy with drug coated balloon angioplasty of right SFA on 07/16/2019, hypertension, hyperlipidemia, type 2 diabetes mellitus, and significant smoking history. Presented for outpatient peripheral angiography with plans for treatment of left SFA on 08/16/2019. Unfortunately this was unsuccessful. Patient admitted overnight due to partial failure of Perclose device.  Assessment & Plan    Critical Limb Ischemia - Patient presented for outpatient peripheral angiography for treatment of left SFA occlusion. Unfortunately this was unsuccessful due to failure to re-enter the true lumen. Perclose device was used with only partial successful so patient admitted overnight for observation. - Patient doing well this morning. However, hemoglobin dropped from 13.6 to 10.1. He does have some ecchymosis at cath site and small hematoma. Discussed with MD. Will get CT to rule out retroperitoneal bleed given  significant drop in hemoglobin.   Right Foot Wound - Patient has 3 unhealing wounds on the ventral side of his right foot that have been there for several months.  - Seen by outpatient wound care earlier this week.  - Will consult Wound Care RN to see patient.  - He requested morphine to help with the pain. I explained that I do not think that is a good option since this has been going on for several months and narcotics are not a good long term plan. Can use  Tylenol.  Hypertension - Continue home medications.  Hyperlipidemia - Continue home statin.  Anemia - Hemoglobin 10.1 today. - Will rule out retroperitoneal bleed. - Continue Aspirin and Plavix for now unless bleed is confirmed.  For questions or updates, please contact Paul Please consult [email or web address] for contact info under        Signed, Darreld Mclean, PA-C  08/17/2019, 9:05 AM

## 2019-08-17 NOTE — Progress Notes (Signed)
I reviewed d/c instructions with patient and how to change his dressing. Patient verbalized understanding of how to do dressing change. Patient d/c'd in stable condition to private vehicle via wheelchair

## 2019-08-17 NOTE — Discharge Summary (Addendum)
Discharge Summary    Patient ID: [name]. MRN: [id number]; DOB: [birth year]  Admit date: 08/16/2019 Discharge date: 08/17/2019  Primary Care Provider: Loretha Brasil, FNP (Inactive)  Primary Cardiologist: Evalina Field, MD  Primary Electrophysiologist:  None   Discharge Diagnoses    Active Problems:   Critical ischemia of foot   Claudication in peripheral vascular disease [facility])    Diagnostic Studies/Procedures    Abdominal Aortogram with Lower Extremity 08/16/2019: Procedure Description: The patient was brought to the second floor [facility] cath lab in the the postabsorptive state. He was premedicated with IV Versed and fentanyl. His right groin was prepped and shaved in usual sterile fashion. Xylocaine 1% was used for local anesthesia. A 5 French sheath was inserted into the right common femoral artery using standard Seldinger technique.  Ultrasound was used to identify the right common femoral artery and obtain access.  Digital image was captured and placed the patient's chart.  A crossover catheter was used to obtain contralateral access (secondary catheter placement).  A 7 French 55 cm multipurpose Ansell sheath was then advanced over a 0.35 versa core wire into the distal right external iliac artery.  Patient received a total of 6000 use of heparin with an ACT of 329.  Total contrast administered to the patient was 200 cc.  Using a Viance crossing catheter along with a 6 g Zilient wire followed by an 018 Nava cross endhole catheter, and a 12 g Zilient wire I crossed the proximal But was never able to reenter the true distal lumen.  I ultimately aborted the procedure.  I then performed a right right common femoral angiogram.  I also angiogram the right tibioperoneal trunk demonstrating it to be widely patent compared to the end of the previous procedure suspecting it was spasm related.  I deployed the Perclose device with only partial success.  The patient required  a manual hold for 15 to 20 minutes.  Impressions: Unsuccessful attempt at left SFA CTO recanalization due to failure to reenter the true lumen at the distal.  Given the partial failure of the Perclose I am going to keep the patient overnight for observation.  We will continue DAPT.  Discharged home the morning.  Follow-up with me in 2 weeks.  He left lab in stable condition. _____________   History of Present Illness     [name]. is a 59 y.o. male with a history of PAD, hypertension, hyperlipidemia, type 2 diabetes mellitus, and significant smoking history. He was referred to Dr. Gwenlyn Found on 07/10/2019 for further evaluation and treatment of critical limb ischemia. At that visit, he had 3 cortisone shots in his right foot 4-5 months prior which results in wounds at the injection sites which have not healed. He also reported claudication bilaterally. Dopplers showed right ABI of 0.60 and left ABI of 0.45 with occluded SFAs bilaterally. He denied any chest pain or shortness of breath. Dr. Gwenlyn Found performed peripheral angiography on 07/16/2019 revealing occluded SFAs bilaterally with one-vessel runoff. Direction atherectomy followed by drug-coated balloon angioplasty of his right SFA was performed. Follow-up dopplers showed improved right ABI from 0.60 to 0.85. Patient was seen by Dr. Gwenlyn Found for follow-up on 07/31/2019 at which time the wounds on his right foot still had not yet healed and were still very painful. He was referred to vascular wound care center for further aggressive local wound care and peripheral angiography for treatment of left SFA was arranged.  _______________  Abdominal/Pelvic  CT 08/17/2019: Impressions: 1. No CT findings for retroperitoneal or extraperitoneal pelvic hematoma. No large upper thigh hematoma. 2. No acute abdominal/pelvic findings, mass lesions or adenopathy. 3. Age advanced vascular calcifications. 4. Contracted gallbladder with high attenuation material which  could be gallstones or vicarious excretion of contrast material.  Hospital Course     Consultants: Wound Care  Patient presented for outpatient peripheral angiogram for treatment of left SFA as stated above. Unfortunately, attempt at left SFA CTO recanalization was unsuccessful due to failure to reenter the true lumen. Perclose device was used but was only partially successful and patient required manual hold for 15-20 minutes. Therefore, patient was admitted for observation overnight. Hemoglobin 10.1 today, down from 13.6 on 07/31/2019. Ecchymosis and mild hematoma noted at right femoral cath site. No bruits or pulsatile mass noted. Given significant drop in hemoglobin, abdominal/pelvic CT ordered to rule retroperitoneal bleed. This was negative for retroperitoneal or extraperitoneal pelvic hematoma. No large upper thigh hematoma observed either. There were hazy interstitial changes in the left upper thigh though which was felt to likely be some amount of hemorrhage related to procedure. Repeat H&H showed hemoglobin of 10.2 suggestive of no active bleed. Therefore, felt to be stable for discharge. Will continue DAPT with Aspirin and Plavix. Continue all home medications. Wound Care consulted prior to discharge for assistance with caring for ongoing open foot wounds. RN will give patient some supplies at discharge. Follow-up visit with Dr. Gwenlyn Found arranged.  Can repeat CBC at follow-up visit. Patient also supposed to follow back up with Wound Care in 1 week.   Did the patient have an acute coronary syndrome (MI, NSTEMI, STEMI, etc) this admission?:  No                               Did the patient have a percutaneous coronary intervention (stent / angioplasty)?:  No.   _____________  Discharge Vitals Blood pressure 122/79, pulse 76, temperature 98.2 F (36.8 C), temperature source Oral, resp. rate 16, height '5\' 2"'  (1.575 m), weight 48.5 kg, SpO2 100 %.  Filed Weights   08/16/19 0536 08/17/19 0333    Weight: 49.9 kg 48.5 kg    Labs & Radiologic Studies    CBC Recent Labs    08/17/19 0540 08/17/19 1318  WBC 6.2  --   HGB 10.1* 10.2*  HCT 31.6* 32.3*  MCV 92.7  --   PLT 252  --    Basic Metabolic Panel Recent Labs    08/16/19 1203 08/17/19 0540  NA 138 137  K 3.9 3.9  CL 105 107  CO2 26 23  GLUCOSE 160* 101*  BUN 23* 16  CREATININE 1.18 1.03  CALCIUM 9.0 8.9   Liver Function Tests No results for input(s): AST, ALT, ALKPHOS, BILITOT, PROT, ALBUMIN in the last 72 hours. No results for input(s): LIPASE, AMYLASE in the last 72 hours. High Sensitivity Troponin:   No results for input(s): TROPONINIHS in the last 720 hours.  BNP Invalid input(s): POCBNP D-Dimer No results for input(s): DDIMER in the last 72 hours. Hemoglobin A1C No results for input(s): HGBA1C in the last 72 hours. Fasting Lipid Panel No results for input(s): CHOL, HDL, LDLCALC, TRIG, CHOLHDL, LDLDIRECT in the last 72 hours. Thyroid Function Tests No results for input(s): TSH, T4TOTAL, T3FREE, THYROIDAB in the last 72 hours.  Invalid input(s): FREET3 _____________  CT ABDOMEN PELVIS WO CONTRAST  Result Date: 08/17/2019 CLINICAL DATA:  Drop  in hemoglobin. Recent abdominal aortogram and angioplasty. EXAM: CT ABDOMEN AND PELVIS WITHOUT CONTRAST TECHNIQUE: Multidetector CT imaging of the abdomen and pelvis was performed following the standard protocol without IV contrast. COMPARISON:  None. FINDINGS: Lower chest: The lung bases are clear of acute process. No pleural effusion or pulmonary lesions. The heart is normal in size. No pericardial effusion. The distal esophagus and aorta are unremarkable. Hepatobiliary: Small low-attenuation lesion in the left hepatic lobe is likely a benign cyst. No worrisome hepatic lesions are identified without contrast. No intrahepatic biliary dilatation. The gallbladder is contracted. High attenuation material could be gallstones or possibly vicarious excretion of contrast  related to the recent arteriogram. Pancreas: No mass, inflammation or ductal dilatation. Spleen: Normal size. No focal lesions. Adrenals/Urinary Tract: The adrenal glands and kidneys are unremarkable. No renal lesions or hydronephrosis. Bladder contains contrast material from the previous aortogram. No bladder lesions. Stomach/Bowel: The stomach, duodenum, small bowel and colon are unremarkable. No acute inflammatory changes, mass lesions or obstructive findings. The terminal ileum and appendix are normal. Vascular/Lymphatic: Age advanced vascular calcifications but no aneurysm. No mesenteric or retroperitoneal mass or adenopathy. Reproductive: The prostate gland and seminal vesicles are unremarkable. Other: No CT findings for retroperitoneal or extraperitoneal pelvic hematoma. No large upper left thigh hematoma. Hazy interstitial changes in the left upper thigh likely some amount of hemorrhage related to the patient's recent procedure. Musculoskeletal: No significant bony findings. Advanced degenerative disc disease noted at L5-S1. IMPRESSION: 1. No CT findings for retroperitoneal or extraperitoneal pelvic hematoma. No large upper thigh hematoma. 2. No acute abdominal/pelvic findings, mass lesions or adenopathy. 3. Age advanced vascular calcifications. 4. Contracted gallbladder with high attenuation material which could be gallstones or vicarious excretion of contrast material. Electronically Signed   By: Marijo Sanes M.D.   On: 08/17/2019 10:25   PERIPHERAL VASCULAR CATHETERIZATION  Result Date: 08/16/2019  [id number] LOCATION:  FACILITY: [facility] PHYSICIAN: Quay Burow, M.D. 11-20-1960 DATE OF PROCEDURE:  08/16/2019 DATE OF DISCHARGE: PV Angiogram/Intervention History obtained from chart review.[employer]. is a 59 y.o.  thin and chronically ill-appearing separated African-American male with no children who does not work because of being disabled. Is referred by Dr. Lesia Sago me for evaluation  treatment of critical limb ischemia.   I last saw him in the office 07/10/2019.  The patient was initially referred to our practice by Dr. Daylene Katayama, podiatry. His risk factors include 20 to 30 pack years of tobacco use having smoked for last 45 years, treated hypertension and hyperlipidemia. He is never had a heart attack or stroke. He denies chest pain or shortness of breath. He apparently had 3 cortisone shots in his right foot 4 to 5 months ago which resulted in wounds at the injection sites which have not healed. Also complains of claudication bilaterally. Recent Dopplers performed today revealed a right ABI of 0.60 and a left of 0.45 with occluded SFAs bilaterally. He does have CKD 3 with serum creatinine in the mid 1 range.  I performed peripheral angiography on him 07/16/2019 revealing occluded SFAs bilaterally with one-vessel runoff.  I performed directional atherectomy followed by drug-coated balloon angioplasty of his right SFA.  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  His right ABI increased from 0.60-0.85.  The wounds on his right foot are still painful and still have not yet to heal.  Because of ongoing claudication in the left leg he wishes to proceed with left SFA intervention for lifestyle  limiting claudication. Pre Procedure Diagnosis: Peripheral arterial disease Post Procedure Diagnosis: Peripheral arterial disease Operators: Dr. Quay Burow Procedures Performed:  1.  Ultrasound-guided right common femoral access  2.  Contralateral access (second order catheter placement)  3.  Failed attempt at left SFA CTO crossing  4.  Partial failure of right SFA.  Perclose PROCEDURE DESCRIPTION: The patient was brought to the second floor [facility] lab in the the postabsorptive state. He was premedicated with IV Versed and fentanyl. His right groin was prepped and shaved in usual sterile fashion. Xylocaine 1% was used for local anesthesia. A 5 French  sheath was inserted into the right common femoral artery using standard Seldinger technique.  Ultrasound was used to identify the right common femoral artery and obtain access.  Digital image was captured and placed the patient's chart.  A crossover catheter was used to obtain contralateral access (secondary catheter placement).  A 7 French 55 cm multipurpose Ansell sheath was then advanced over a 0.35 versa core wire into the distal right external iliac artery.  Patient received a total of 6000 use of heparin with an ACT of 329.  Total contrast administered to the patient was 200 cc. Using a Viance crossing catheter along with a 6 g Zilient wire followed by an 018 Nava cross endhole catheter, and a 12 g Zilient wire I crossed the proximal But was never able to reenter the true distal lumen.  I ultimately aborted the procedure.  I then performed a right right common femoral angiogram.  I also angiogram the right tibioperoneal trunk demonstrating it to be widely patent compared to the end of the previous procedure suspecting it was spasm related.  I deployed the Perclose device with only partial success.  The patient required a manual hold for 15 to 20 minutes.   Unsuccessful attempt at left SFA CTO recanalization due to failure to reenter the true lumen at the distal.  Given the partial failure of the Perclose I am going to keep the patient overnight for observation.  We will continue DAPT.  Discharged home the morning.  Follow-up with me in 2 weeks.  He left lab in stable condition. Quay Burow. MD, [facility] 08/16/2019 9:02 AM   VAS [place] ABI WITH/WO TBI  Result Date: 07/28/2019 LOWER EXTREMITY DOPPLER STUDY Indications: History of PAD with right SFA intervention. Patient states there              has not been any improvement in his leg or foot since procedure. High Risk Factors: Hypertension, hyperlipidemia, Diabetes, current smoker.  Vascular Interventions: Successful Hawk 1 directional atherectomy of a long                          right SFA CTO from the origin down to the adductor canal                         using spider distal protection and drug-coated balloon                         angioplasty with one-vessel runoff via the peroneal on                         07/10/2019. Comparison Study: In 06/2019, an arterial Doppler showed an ABI of .60 on the  right and .42 on the left. Performing Technologist: Sharlett Iles RVT  Examination Guidelines: A complete evaluation includes at minimum, Doppler waveform signals and systolic blood pressure reading at the level of bilateral brachial, anterior tibial, and posterior tibial arteries, when vessel segments are accessible. Bilateral testing is considered an integral part of a complete examination. Photoelectric Plethysmograph (PPG) waveforms and toe systolic pressure readings are included as required and additional duplex testing as needed. Limited examinations for reoccurring indications may be performed as noted.  ABI Findings: +---------+------------------+-----+----------+--------+ Right    Rt Pressure (mmHg)IndexWaveform  Comment  +---------+------------------+-----+----------+--------+ Brachial 171                                       +---------+------------------+-----+----------+--------+ ATA      136               0.80 monophasic         +---------+------------------+-----+----------+--------+ PTA      146               0.85 monophasic         +---------+------------------+-----+----------+--------+ PERO     127               0.74 monophasic         +---------+------------------+-----+----------+--------+ Great Toe86                0.50 Abnormal           +---------+------------------+-----+----------+--------+ +---------+------------------+-----+----------+-----------------------------+ Left     Lt Pressure (mmHg)IndexWaveform  Comment                        +---------+------------------+-----+----------+-----------------------------+ Brachial 160                                                            +---------+------------------+-----+----------+-----------------------------+ ATA      86                0.50 monophasic                              +---------+------------------+-----+----------+-----------------------------+ PTA      0                 0.00 absent    inaudible, possible occlusion +---------+------------------+-----+----------+-----------------------------+ PERO     0                 0.00 absent    inaudible, possible occlusion +---------+------------------+-----+----------+-----------------------------+ Great Toe55                0.32 Abnormal                                +---------+------------------+-----+----------+-----------------------------+ +-------+-----------+-----------+------------+------------+ ABI/TBIToday's ABIToday's TBIPrevious ABIPrevious TBI +-------+-----------+-----------+------------+------------+ Right  .85        .50        .60         .25          +-------+-----------+-----------+------------+------------+ Left   .50        .32        .42         .  0           +-------+-----------+-----------+------------+------------+ Bilateral ABIs and TBIs appear increased compared to prior study on 07/10/2019.  Summary: Right: Resting right ankle-brachial index indicates mild right lower extremity arterial disease. The right toe-brachial index is abnormal. ABIs increased by .25; TBIs increased by .25. Left: Resting left ankle-brachial index indicates moderate left lower extremity arterial disease. The left toe-brachial index is abnormal. ABIs increased by ..08; TBIs increased by .32.  *See table(s) above for measurements and observations.  Suggest follow up study in 6 months. Electronically signed by Quay Burow MD on 07/28/2019 at 7:29:24 AM.    Final    VAS [place] LOWER EXTREMITY ARTERIAL  DUPLEX  Result Date: 07/28/2019 LOWER EXTREMITY ARTERIAL DUPLEX STUDY Indications: History of PAD with right SFA intervention. Patient states there              has not been any improvement in his leg or foot since procedure. High Risk Factors: Hypertension, hyperlipidemia, Diabetes, current smoker.  Vascular Interventions: Successful Hawk 1 directional atherectomy of a long                         right SFA CTO from the origin down to the adductor canal                         using spider distal protection and drug-coated balloon                         angioplasty with one-vessel runoff via the peroneal on                         07/10/2019. Current ABI:            .85 on the right and .50 on the left Comparison Study: In 06/2019, a lower arterial duplex showed an occluded right                   mid to distal SFA. occluded mid ATA and near occlusion mid PTA                   and occluded distal PTA. One vessel run-off via right peroneal                   artery. Performing Technologist: Sharlett Iles RVT  Examination Guidelines: A complete evaluation includes B-mode imaging, spectral Doppler, color Doppler, and power Doppler as needed of all accessible portions of each vessel. Bilateral testing is considered an integral part of a complete examination. Limited examinations for reoccurring indications may be performed as noted.  +----------+--------+-----+---------------+--------+--------------------------+ RIGHT     PSV cm/sRatioStenosis       WaveformComments                   +----------+--------+-----+---------------+--------+--------------------------+ CFA Prox  263          50-74% stenosis        turbulent flow now evident +----------+--------+-----+---------------+--------+--------------------------+ CFA Mid   243          50-74% stenosis        turbulent flow now evident +----------+--------+-----+---------------+--------+--------------------------+ CFA Distal195                                                             +----------+--------+-----+---------------+--------+--------------------------+  DFA       188                                                            +----------+--------+-----+---------------+--------+--------------------------+ SFA Prox  126                                                            +----------+--------+-----+---------------+--------+--------------------------+ SFA Mid   286          50-74% stenosis                                   +----------+--------+-----+---------------+--------+--------------------------+ SFA Distal104                                                            +----------+--------+-----+---------------+--------+--------------------------+ POP Prox  72                                                             +----------+--------+-----+---------------+--------+--------------------------+ POP Mid   72                                                             +----------+--------+-----+---------------+--------+--------------------------+ POP Distal114                                                            +----------+--------+-----+---------------+--------+--------------------------+ TP Trunk  68                                                             +----------+--------+-----+---------------+--------+--------------------------+ A focal velocity elevation of 286 cm/s was obtained at mid SFA with a VR of 2.5. Findings are characteristic of 50-74% stenosis.  Summary: Right: Heterogenous plaque throughout, with low resistant waveforms noted throughout. Turbulent flow now evident in the proximal and mid CFA. The proximal and mid CFA are now 50-74% stenosed. Patent right SFA with elevated velocities in the mid segment suggesting >50% restenosis, s/p atherectomy and angioplasty.   See table(s) above for measurements and observations. See ABI report. Suggest follow up  study in 6 months. Electronically signed by Quay Burow MD  on 07/28/2019 at 7:27:37 AM.    Final    Disposition   Patient is being discharged home today in good condition.  Follow-up Plans & Appointments     Follow-up Information    Lorretta Harp, MD Follow up.   Specialties: Cardiology, Radiology Why: Hospital follow-up scheduled for 08/29/2019 at 3:00pm. Please arrive 15 minutes early for check-in. If this date/time does not work for you, please call our office to reschedule. Contact information: [address] [place] [address] [phone]              Discharge Instructions    Diet - low sodium heart healthy   Complete by: As directed    Discharge wound care:   Complete by: As directed    Follow the wound care instructions given by Wound Care specialist (Hoyte Stone, PA-C) at office visit on 08/15/2019.   Increase activity slowly   Complete by: As directed       Discharge Medications   Allergies as of 08/17/2019   No Known Allergies     Medication List    TAKE these medications   aspirin 81 MG EC tablet Take 1 tablet (81 mg total) by mouth daily. Swallow whole.   atorvastatin 80 MG tablet Commonly known as: LIPITOR Take 1 tablet (80 mg total) by mouth daily.   Blood Pressure Monitor Kit Check blood pressure daily   Chantix Starting Month Pak 0.5 MG X 11 & 1 MG X 42 tablet Generic drug: varenicline Take one 0.5 mg tablet by mouth once daily for 3 days, then increase to one 0.5 mg tablet twice daily for 4 days, then increase to one 1 mg tablet twice daily. What changed:   how much to take  how to take this  when to take this  additional instructions   clopidogrel 75 MG tablet Commonly known as: PLAVIX Take 1 tablet (75 mg total) by mouth daily with breakfast.   doxycycline 100 MG tablet Commonly known as: VIBRA-TABS Take 100 mg by mouth 2 (two) times daily.   gabapentin 100 MG capsule Commonly known as: NEURONTIN Take  200 mg by mouth 3 (three) times daily.   lisinopril-hydrochlorothiazide 20-25 MG tablet Commonly known as: ZESTORETIC Take 1 tablet by mouth daily.   Lubricant Eye Drops 0.5 % Soln Generic drug: carboxymethylcellulose Place 1 drop into both eyes 3 (three) times daily as needed.            Discharge Care Instructions  (From admission, onward)         Start     Ordered   08/17/19 0000  Discharge wound care:       Comments: Follow the wound care instructions given by Wound Care specialist [facility] Joaquim Lai, [place]) at office visit on 08/15/2019.   08/17/19 1448             Outstanding Labs/Studies   Repeat CBC at follow-up visit.  Duration of Discharge Encounter   Greater than 30 minutes including physician time.  Signed, Darreld Mclean, PA-C 08/17/2019, 3:14 PM  Patient seen and evaluated today.  Doing relatively well.  Concerning issue is his drop in hemoglobin in a patient who has just had a peripheral procedure with failed closure device.  CT scan done today does not show any significant bleeding or hematoma. Hemoglobin level is stable suggesting that there is no active bleeding.  Okay for discharge home.  We have asked wound care to come and assist with recommendations for  how to care for his ongoing foot ulcers.Marland Kitchen  He is a patient of Dr. Eleonore Chiquito for general cardiology and Dr. Quay Burow for vascular cardiology.  We will need follow-up arranged.   Glenetta Hew, MD

## 2019-08-22 ENCOUNTER — Encounter (HOSPITAL_BASED_OUTPATIENT_CLINIC_OR_DEPARTMENT_OTHER): Payer: Medicare Other | Admitting: Physician Assistant

## 2019-08-23 ENCOUNTER — Ambulatory Visit (HOSPITAL_COMMUNITY): Payer: Medicare Other

## 2019-08-24 ENCOUNTER — Telehealth: Payer: Self-pay | Admitting: Cardiovascular Disease

## 2019-08-24 NOTE — Telephone Encounter (Signed)
[facility] is calling requesting to speak with a nurse in regards to why his procedure scheduled for 08/23/19 was canceled. Please advise.

## 2019-08-24 NOTE — Telephone Encounter (Signed)
Per chart review, 7/29 doppler was cancelled b/c no intervention was done. LM on name-verified VM with this info. Patient has office visit with MD 08/29/19

## 2019-08-29 ENCOUNTER — Ambulatory Visit: Payer: Medicare Other | Admitting: Cardiovascular Disease

## 2019-08-29 ENCOUNTER — Encounter (HOSPITAL_BASED_OUTPATIENT_CLINIC_OR_DEPARTMENT_OTHER): Payer: Medicare Other | Attending: Physician Assistant | Admitting: Physician Assistant

## 2019-09-11 ENCOUNTER — Ambulatory Visit (INDEPENDENT_AMBULATORY_CARE_PROVIDER_SITE_OTHER): Payer: Medicare Other | Admitting: Cardiovascular Disease

## 2019-09-11 ENCOUNTER — Encounter: Payer: Self-pay | Admitting: Cardiovascular Disease

## 2019-09-11 ENCOUNTER — Other Ambulatory Visit: Payer: Self-pay

## 2019-09-11 DIAGNOSIS — I998 Other disorder of circulatory system: Secondary | ICD-10-CM | POA: Diagnosis not present

## 2019-09-11 DIAGNOSIS — I70229 Atherosclerosis of native arteries of extremities with rest pain, unspecified extremity: Secondary | ICD-10-CM

## 2019-09-11 NOTE — Patient Instructions (Signed)
Medication Instructions:  The current medical regimen is effective;  continue present plan and medications.  *If you need a refill on your cardiac medications before your next appointment, please call your pharmacy*    Follow-Up: At [facility], you and your health needs are our priority.  As part of our continuing mission to provide you with exceptional heart care, we have created designated Provider Care Teams.  These Care Teams include your primary Cardiologist (physician) and Advanced Practice Providers (APPs -  Physician Assistants and Nurse Practitioners) who all work together to provide you with the care you need, when you need it.  We recommend signing up for the patient portal called "MyChart".  Sign up information is provided on this After Visit Summary.  MyChart is used to connect with patients for Virtual Visits (Telemedicine).  Patients are able to view lab/test results, encounter notes, upcoming appointments, etc.  Non-urgent messages can be sent to your provider as well.   To learn more about what you can do with MyChart, go to [email or web address].    Your next appointment:   3 month(s)  The format for your next appointment:   In Person  Provider:   Quay Burow, MD

## 2019-09-11 NOTE — Assessment & Plan Note (Signed)
[name] returns a for follow-up.  He was sent to me for critical limb ischemia and nonhealing ulcer on the dorsum of his right foot with bilateral lower extremity claudication.  I performed right SFA intervention of a long CTO 07/16/2019.  He had one-vessel runoff via peroneal.  His ABI did increase although the wound on his foot has yet to heal.  I did not see an adequate entry point for his tibial vessels on the right to perform additional revascularization and promote healing.  I performed staged intervention on him 08/16/2019 With Failure to Reenter the Distal of a long left SFA CTO.  Continues to have left calf claudication.  He may be a candidate for tibial pedal access on the left.  I reviewed this with Dr. Fletcher Anon.

## 2019-09-11 NOTE — Progress Notes (Signed)
09/11/2019 [place].   July 15, 1960  [id number]  Primary Physician Loretha Brasil, FNP (Inactive) Primary Cardiologist: Lorretta Harp MD FACP, [place], [place], [place]  HPI:  Clearance Chenault. is a 59 y.o.  thin and chronically ill-appearing separated African-American male with no children who does not work because of being disabled. Is referred by Dr. Lesia Sago me for evaluation treatment of critical limb ischemia.   I last saw him in the office 07/31/2019.  The patient was initially referred to our practice by Dr. Daylene Katayama, podiatry. His risk factors include 20 to 30 pack years of tobacco use having smoked for last 45 years, treated hypertension and hyperlipidemia. He is never had a heart attack or stroke. He denies chest pain or shortness of breath. He apparently had 3 cortisone shots in his right foot 4 to 5 months ago which resulted in wounds at the injection sites which have not healed. Also complains of claudication bilaterally. Recent Dopplers performed today revealed a right ABI of 0.60 and a left of 0.45 with occluded SFAs bilaterally. He does have CKD 3 with serum creatinine in the mid 1 range.  I performed peripheral angiography on him 07/16/2019 revealing occluded SFAs bilaterally with one-vessel runoff.  I performed directional atherectomy followed by drug-coated balloon angioplasty of his right SFA.  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  His right ABI increased from 0.60-0.85.  The wounds on his right foot are still painful and still have not yet to heal.  I attempted to open up his left SFA CTO 08/16/2019 was unsuccessful reentering the distal.  He also had a partial failure of his right common femoral Perclose.  He was kept overnight.  He still has claudication on the left.  He also continues to smoke.   Current Meds  Medication Sig  . aspirin EC 81 MG EC tablet Take 1 tablet (81 mg total) by mouth daily. Swallow whole.  Marland Kitchen  atorvastatin (LIPITOR) 80 MG tablet Take 1 tablet (80 mg total) by mouth daily.  . Blood Pressure Monitor KIT Check blood pressure daily  . carboxymethylcellulose (LUBRICANT EYE DROPS) 0.5 % SOLN Place 1 drop into both eyes 3 (three) times daily as needed.  . clopidogrel (PLAVIX) 75 MG tablet Take 1 tablet (75 mg total) by mouth daily with breakfast.  . doxycycline (VIBRA-TABS) 100 MG tablet Take 100 mg by mouth 2 (two) times daily.   Marland Kitchen gabapentin (NEURONTIN) 100 MG capsule Take 200 mg by mouth 3 (three) times daily.  Marland Kitchen lisinopril-hydrochlorothiazide (ZESTORETIC) 20-25 MG tablet Take 1 tablet by mouth daily.  Marland Kitchen SANTYL ointment Apply topically.  Marland Kitchen SSD 1 % cream Apply topically.  . traMADol (ULTRAM) 50 MG tablet Take 50 mg by mouth 3 (three) times daily as needed.  . varenicline (CHANTIX STARTING MONTH PAK) 0.5 MG X 11 & 1 MG X 42 tablet Take one 0.5 mg tablet by mouth once daily for 3 days, then increase to one 0.5 mg tablet twice daily for 4 days, then increase to one 1 mg tablet twice daily. (Patient taking differently: Take 1 mg by mouth 2 (two) times daily. one 1 mg tablet twice daily.)     No Known Allergies  Social History   Socioeconomic History  . Marital status: Legally Separated    Spouse name: Not on file  . Number of children: Not on file  . Years of education: Not on file  .  Highest education level: Not on file  Occupational History  . Not on file  Tobacco Use  . Smoking status: Current Every Day Smoker    Packs/day: 0.50    Years: 40.00    Pack years: 20.00    Types: Cigarettes  . Smokeless tobacco: Never Used  Vaping Use  . Vaping Use: Never used  Substance and Sexual Activity  . Alcohol use: Yes    Comment: beer and liquor daily  . Drug use: Yes    Frequency: 3.0 times per week    Types: Marijuana  . Sexual activity: Not on file  Other Topics Concern  . Not on file  Social History Narrative  . Not on file   Social Determinants of Health   Financial  Resource Strain:   . Difficulty of Paying Living Expenses:   Food Insecurity:   . Worried About Charity fundraiser in the Last Year:   . Arboriculturist in the Last Year:   Transportation Needs:   . Film/video editor (Medical):   Marland Kitchen Lack of Transportation (Non-Medical):   Physical Activity:   . Days of Exercise per Week:   . Minutes of Exercise per Session:   Stress:   . Feeling of Stress :   Social Connections:   . Frequency of Communication with Friends and Family:   . Frequency of Social Gatherings with Friends and Family:   . Attends Religious Services:   . Active Member of Clubs or Organizations:   . Attends Archivist Meetings:   Marland Kitchen Marital Status:   Intimate Partner Violence:   . Fear of Current or Ex-Partner:   . Emotionally Abused:   Marland Kitchen Physically Abused:   . Sexually Abused:      Review of Systems: General: negative for chills, fever, night sweats or weight changes.  Cardiovascular: negative for chest pain, dyspnea on exertion, edema, orthopnea, palpitations, paroxysmal nocturnal dyspnea or shortness of breath Dermatological: negative for rash Respiratory: negative for cough or wheezing Urologic: negative for hematuria Abdominal: negative for nausea, vomiting, diarrhea, bright red blood per rectum, melena, or hematemesis Neurologic: negative for visual changes, syncope, or dizziness All other systems reviewed and are otherwise negative except as noted above.    Blood pressure 106/64, pulse 65, height _0  (1.575 m), weight 105 lb 6.4 oz (47.8 kg), SpO2 97 %.  General appearance: alert and no distress Neck: no adenopathy, no carotid bruit, no JVD, supple, symmetrical, trachea midline and thyroid not enlarged, symmetric, no tenderness/mass/nodules Lungs: clear to auscultation bilaterally Heart: regular rate and rhythm, S1, S2 normal, no murmur, click, rub or gallop Extremities: extremities normal, atraumatic, no cyanosis or edema Pulses: 2+ and  symmetric Skin: Ischemic ulcer dorsum right foot Neurologic: Alert and oriented X 3, normal strength and tone. Normal symmetric reflexes. Normal coordination and gait  EKG not performed today  ASSESSMENT AND PLAN:   Critical ischemia of foot Mr. Ryle returns a for follow-up.  He was sent to me for critical limb ischemia and nonhealing ulcer on the dorsum of his right foot with bilateral lower extremity claudication.  I performed right SFA intervention of a long CTO 07/16/2019.  He had one-vessel runoff via peroneal.  His ABI did increase although the wound on his foot has yet to heal.  I did not see an adequate entry point for his tibial vessels on the right to perform additional revascularization and promote healing.  I performed staged intervention on him 08/16/2019 With Failure  to Reenter the Distal of a long left SFA CTO.  Continues to have left calf claudication.  He may be a candidate for tibial pedal access on the left.  I reviewed this with Dr. Fletcher Anon.      Lorretta Harp MD FACP,FACC,FAHA, [facility] 09/11/2019 11:04 AM

## 2019-09-17 ENCOUNTER — Encounter (HOSPITAL_BASED_OUTPATIENT_CLINIC_OR_DEPARTMENT_OTHER): Payer: Medicare Other | Admitting: Internal Medicine

## 2019-09-21 ENCOUNTER — Encounter (HOSPITAL_BASED_OUTPATIENT_CLINIC_OR_DEPARTMENT_OTHER): Payer: Medicare Other | Attending: Internal Medicine | Admitting: Internal Medicine

## 2019-09-21 DIAGNOSIS — E1151 Type 2 diabetes mellitus with diabetic peripheral angiopathy without gangrene: Secondary | ICD-10-CM | POA: Diagnosis not present

## 2019-09-21 DIAGNOSIS — I10 Essential (primary) hypertension: Secondary | ICD-10-CM | POA: Insufficient documentation

## 2019-09-21 DIAGNOSIS — E11621 Type 2 diabetes mellitus with foot ulcer: Secondary | ICD-10-CM | POA: Insufficient documentation

## 2019-09-21 DIAGNOSIS — L97512 Non-pressure chronic ulcer of other part of right foot with fat layer exposed: Secondary | ICD-10-CM | POA: Insufficient documentation

## 2019-09-21 DIAGNOSIS — F17218 Nicotine dependence, cigarettes, with other nicotine-induced disorders: Secondary | ICD-10-CM | POA: Insufficient documentation

## 2019-09-21 DIAGNOSIS — E1140 Type 2 diabetes mellitus with diabetic neuropathy, unspecified: Secondary | ICD-10-CM | POA: Insufficient documentation

## 2019-09-21 DIAGNOSIS — D649 Anemia, unspecified: Secondary | ICD-10-CM | POA: Diagnosis not present

## 2019-09-21 DIAGNOSIS — I771 Stricture of artery: Secondary | ICD-10-CM | POA: Diagnosis not present

## 2019-09-23 NOTE — Progress Notes (Signed)
[name] ([id number]) Visit Report for 09/21/2019 Debridement Details Patient Name: Date of Service: [place] 09/21/2019 9:00 A M Medical Record Number: [id number] Patient Account Number: [id number] Date of Birth/Sex: Treating RN: July 18, 1960 (59 y.o. [name] Primary Care Provider: [name] Other Clinician: Referring Provider: Treating Provider/Extender: [name] in Treatment: 5 Debridement Performed for Assessment: Wound #1 Right,Dorsal Foot Performed By: Physician Ricard Dillon., MD Debridement Type: Chemical/Enzymatic/Mechanical Agent Used: Santyl Severity of Tissue Pre Debridement: Fat layer exposed Level of Consciousness (Pre-procedure): Awake and Alert Pre-procedure Verification/Time Out No Taken: Bleeding: None Response to Treatment: Procedure was tolerated well Level of Consciousness (Post- Awake and Alert procedure): Post Debridement Measurements of Total Wound Length: (cm) 1.5 Width: (cm) 1.3 Depth: (cm) 0.1 Volume: (cm) 0.153 Character of Wound/Ulcer Post Debridement: Improved Severity of Tissue Post Debridement: Fat layer exposed Post Procedure Diagnosis Same as Pre-procedure Electronic Signature(s) Signed: 09/21/2019 5:56:33 PM By: Kela Millin Signed: 09/23/2019 7:39:18 AM By: Linton Ham MD Entered By: Linton Ham on 09/21/2019 09:56:04 -------------------------------------------------------------------------------- HPI Details Patient Name: Date of Service: [name], Ruben Kalata J. 09/21/2019 9:00 A M Medical Record Number: [id number] Patient Account Number: [id number] Date of Birth/Sex: Treating RN: 1960/08/31 (59 y.o. [name] Primary Care Provider: [name] Other Clinician: Referring Provider: Treating Provider/Extender: Domenic Polite Weeks in Treatment: 5 History of Present Illness HPI Description: 08/15/2019 upon evaluation today  patient presents for evaluation here in our clinic concerning issues that he is having with wounds on the dorsal surface of his right foot. This is something that was noted to occur after he was obtaining cortisone injections with [facility]. Subsequently he developed ulcerations he was then referred to Dr. Alvester Chou who performed arterial testing and subsequently found that the patient had poor arterial flow with an ABI of 0.6 and a TBI of 0.26. With that being said he has since on 07/16/2019 had an angiogram on the right with improvement of his ABI to 0.85 and TBI to 0.5. Obviously this is not perfect but is definitely far superior to where it was previous. He does have some necrotic tissue on the surface of the wounds is going to need debriding away he has been using Silvadene on this but to be honest that is not can to do anything for these wounds. He needs something more to clear this away sharp debridement is ideal and we may even look towards Santyl as well. The patient does have a history of diabetes noted in his chart though is not on medication and his A1c was around 6.4 so is not terrible. With that being said he does have known peripheral vascular disease he is actually have an angiogram on the left tomorrow. He also has hypertension and he does smoke. 8/27; this is a patient has been here once before about 6 weeks ago. Since then he was hospitalized from 7/22 through 7/23 by Dr. Gwenlyn Found for an attempt at revascularization. He had previously been seen by Dr. Amalia Hailey of podiatry. He is a continued tobacco smoker. His angiogram on 6/21 via revealed a occluded SFAs bilaterally with one-vessel runoff. He had a atherectomy followed by a drug-coated balloon angioplasty of the right SFA. 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 He had some improvement in his right ABI from 0.6-0.85. There is also an attempt to open his left SFA but that was unsuccessful. He does  not have  a wound on the left side. We are using Santyl on the patient's wounds on the right dorsal foot. He says he has about a 1 minute exercise tolerance before stopping because of pain compatible with claudication Electronic Signature(s) Signed: 09/23/2019 7:39:18 AM By: Linton Ham MD Entered By: Linton Ham on 09/21/2019 09:58:14 -------------------------------------------------------------------------------- Physical Exam Details Patient Name: Date of Service: [name]. 09/21/2019 9:00 A M Medical Record Number: [id number] Patient Account Number: [id number] Date of Birth/Sex: Treating RN: 08/29/1960 (59 y.o. [name] Primary Care Provider: [name] Other Clinician: Referring Provider: Treating Provider/Extender: Domenic Polite Weeks in Treatment: 5 Constitutional Sitting or standing Blood Pressure is within target range for patient.. Pulse regular and within target range for patient.Marland Kitchen Respirations regular, non-labored and within target range.. Temperature is normal and within the target range for the patient.Marland Kitchen Appears in no distress. Cardiovascular He has pedal pulses on both sides however somewhat anemic. I could not feel his posterior tibial pulses. Notes Wound exam; the patient has 2 wounds on the dorsal foot. Fortunately the small lateral wound has healed. The larger wound just medial to this wound is still open. This was vigorously washed out with wound cleanser and gauze. Surprisingly it cleans up fairly well. The tissue underneath is satisfactory looking. Electronic Signature(s) Signed: 09/23/2019 7:39:18 AM By: Linton Ham MD Entered By: Linton Ham on 09/21/2019 09:59:23 -------------------------------------------------------------------------------- Physician Orders Details Patient Name: Date of Service: [name], Ruben Kalata J. 09/21/2019 9:00 A M Medical Record Number: [id number] Patient Account Number: [id number] Date  of Birth/Sex: Treating RN: 1960/04/30 (59 y.o. [name] Primary Care Provider: [name] Other Clinician: Referring Provider: Treating Provider/Extender: [name] in Treatment: 5 Verbal / Phone Orders: No Diagnosis Coding Follow-up Appointments Return Appointment in 2 weeks. Dressing Change Frequency Wound #1 Right,Dorsal Foot Change dressing every day. Wound Cleansing Wound #1 Right,Dorsal Foot May shower and wash wound with soap and water. Primary Wound Dressing Wound #1 Right,Dorsal Foot Santyl Ointment Secondary Dressing Wound #1 Right,Dorsal Foot Kerlix/Rolled Gauze Dry Gauze Electronic Signature(s) Signed: 09/21/2019 5:56:33 PM By: Kela Millin Signed: 09/23/2019 7:39:18 AM By: Linton Ham MD Entered By: Kela Millin on 09/21/2019 09:15:43 -------------------------------------------------------------------------------- Problem List Details Patient Name: Date of Service: [name], Ruben Kalata J. 09/21/2019 9:00 A M Medical Record Number: [id number] Patient Account Number: [id number] Date of Birth/Sex: Treating RN: [birth year] (59 y.o. [name] Primary Care Provider: [name] Other Clinician: Referring Provider: Treating Provider/Extender: Domenic Polite Weeks in Treatment: 5 Active Problems ICD-10 Encounter Code Description Active Date MDM Diagnosis I73.89 Other specified peripheral vascular diseases 08/15/2019 No Yes E11.621 Type 2 diabetes mellitus with foot ulcer 08/15/2019 No Yes L97.512 Non-pressure chronic ulcer of other part of right foot with fat layer exposed 08/15/2019 No Yes I10 Essential (primary) hypertension 08/15/2019 No Yes F17.218 Nicotine dependence, cigarettes, with other nicotine-induced disorders 08/15/2019 No Yes Inactive Problems Resolved Problems Electronic Signature(s) Signed: 09/23/2019 7:39:18 AM By: Linton Ham MD Entered By: Linton Ham on  09/21/2019 09:55:41 -------------------------------------------------------------------------------- Progress Note Details Patient Name: Date of Service: [name], Ruben Kalata J. 09/21/2019 9:00 A M Medical Record Number: [id number] Patient Account Number: [id number] Date of Birth/Sex: Treating RN: 06-24-1960 (59 y.o. [name] Primary Care Provider: [name] Other Clinician: Referring Provider: Treating Provider/Extender: Domenic Polite Weeks in Treatment: 5 Subjective History of Present Illness (HPI) 08/15/2019 upon evaluation today  patient presents for evaluation here in our clinic concerning issues that he is having with wounds on the dorsal surface of his right foot. This is something that was noted to occur after he was obtaining cortisone injections with [facility]. Subsequently he developed ulcerations he was then referred to Dr. Alvester Chou who performed arterial testing and subsequently found that the patient had poor arterial flow with an ABI of 0.6 and a TBI of 0.26. With that being said he has since on 07/16/2019 had an angiogram on the right with improvement of his ABI to 0.85 and TBI to 0.5. Obviously this is not perfect but is definitely far superior to where it was previous. He does have some necrotic tissue on the surface of the wounds is going to need debriding away he has been using Silvadene on this but to be honest that is not can to do anything for these wounds. He needs something more to clear this away sharp debridement is ideal and we may even look towards Santyl as well. The patient does have a history of diabetes noted in his chart though is not on medication and his A1c was around 6.4 so is not terrible. With that being said he does have known peripheral vascular disease he is actually have an angiogram on the left tomorrow. He also has hypertension and he does smoke. 8/27; this is a patient has been here once before about 6 weeks ago.  Since then he was hospitalized from 7/22 through 7/23 by Dr. Gwenlyn Found for an attempt at revascularization. He had previously been seen by Dr. Amalia Hailey of podiatry. He is a continued tobacco smoker. His angiogram on 6/21 via revealed a occluded SFAs bilaterally with one-vessel runoff. He had a atherectomy followed by a drug-coated balloon angioplasty of the right SFA. 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 He had some improvement in his right ABI from 0.6-0.85. There is also an attempt to open his left SFA but that was unsuccessful. He does not have a wound on the left side. We are using Santyl on the patient's wounds on the right dorsal foot. He says he has about a 1 minute exercise tolerance before stopping because of pain compatible with claudication Objective Constitutional Sitting or standing Blood Pressure is within target range for patient.. Pulse regular and within target range for patient.Marland Kitchen Respirations regular, non-labored and within target range.. Temperature is normal and within the target range for the patient.Marland Kitchen Appears in no distress. Vitals Time Taken: 8:22 AM, Height: 62 in, Weight: 130 lbs, BMI: 23.8, Temperature: 97.7 F, Pulse: 86 bpm, Respiratory Rate: 16 breaths/min, Blood Pressure: 112/72 mmHg. Cardiovascular He has pedal pulses on both sides however somewhat anemic. I could not feel his posterior tibial pulses. General Notes: Wound exam; the patient has 2 wounds on the dorsal foot. Fortunately the small lateral wound has healed. The larger wound just medial to this wound is still open. This was vigorously washed out with wound cleanser and gauze. Surprisingly it cleans up fairly well. The tissue underneath is satisfactory looking. Integumentary (Hair, Skin) Wound #1 status is Open. Original cause of wound was Gradually Appeared. The wound is located on the Right,Dorsal Foot. The wound measures 1.5cm length x 1.3cm width x 0.1cm depth; 1.532cm^2  area and 0.153cm^3 volume. There is no tunneling or undermining noted. There is a medium amount of serosanguineous drainage noted. The wound margin is flat and intact. There is no granulation within the wound  bed. There is a large (67-100%) amount of necrotic tissue within the wound bed including Adherent Slough. Wound #2 status is Healed - Epithelialized. Original cause of wound was Gradually Appeared. The wound is located on the Right,Lateral,Dorsal Foot. The wound measures 0cm length x 0cm width x 0cm depth; 0cm^2 area and 0cm^3 volume. There is no tunneling or undermining noted. There is a none present amount of drainage noted. The wound margin is flat and intact. There is no granulation within the wound bed. There is no necrotic tissue within the wound bed. Assessment Active Problems ICD-10 Other specified peripheral vascular diseases Type 2 diabetes mellitus with foot ulcer Non-pressure chronic ulcer of other part of right foot with fat layer exposed Essential (primary) hypertension Nicotine dependence, cigarettes, with other nicotine-induced disorders Procedures Wound #1 Pre-procedure diagnosis of Wound #1 is a Diabetic Wound/Ulcer of the Lower Extremity located on the Right,Dorsal Foot .Severity of Tissue Pre Debridement is: Fat layer exposed. There was a Chemical/Enzymatic/Mechanical debridement performed by Ricard Dillon., MD.. Agent used was Santyl. There was no bleeding. The procedure was tolerated well. Post Debridement Measurements: 1.5cm length x 1.3cm width x 0.1cm depth; 0.153cm^3 volume. Character of Wound/Ulcer Post Debridement is improved. Severity of Tissue Post Debridement is: Fat layer exposed. Post procedure Diagnosis Wound #1: Same as Pre-Procedure Plan Follow-up Appointments: Return Appointment in 2 weeks. Dressing Change Frequency: Wound #1 Right,Dorsal Foot: Change dressing every day. Wound Cleansing: Wound #1 Right,Dorsal Foot: May shower and wash wound  with soap and water. Primary Wound Dressing: Wound #1 Right,Dorsal Foot: Santyl Ointment Secondary Dressing: Wound #1 Right,Dorsal Foot: Kerlix/Rolled Gauze Dry Gauze 1. I am going to continue with the Santyl to the remaining wound on the right dorsal foot. 2. He is status post a right SFA intervention I believe with angioplasty and stenting but he only has 1 vessel runoff via the peroneal. Dr. Gwenlyn Found made the further comment that he did not see an adequate entry point for his tibial vessels on the right to perform additional revascularization on the right side. He continues to have bilateral claudication with minimal activity 3. If we can continue to clean up the wound on the right dorsal foot I will consider changing the dressing next week 4. Would even consider an advanced treatment product. 5. I talked to him about cigarette smoking. Electronic Signature(s) Signed: 09/23/2019 7:39:18 AM By: Linton Ham MD Entered By: Linton Ham on 09/21/2019 10:01:19 -------------------------------------------------------------------------------- SuperBill Details Patient Name: Date of Service: [name], [name] 09/21/2019 Medical Record Number: [id number] Patient Account Number: [id number] Date of Birth/Sex: Treating RN: 04/25/1960 (59 y.o. [name] Primary Care Provider: [name] Other Clinician: Referring Provider: Treating Provider/Extender: Domenic Polite Weeks in Treatment: 5 Diagnosis Coding ICD-10 Codes Code Description I73.89 Other specified peripheral vascular diseases E11.621 Type 2 diabetes mellitus with foot ulcer L97.512 Non-pressure chronic ulcer of other part of right foot with fat layer exposed I10 Essential (primary) hypertension F17.218 Nicotine dependence, cigarettes, with other nicotine-induced disorders Facility Procedures CPT4 Code: [id number] Description: [phone] - DEBRIDE W/O ANES NON SELECT Modifier: Quantity: 1 Physician  Procedures : CPT4 Code Description Modifier [id number] - WC PHYS LEVEL 3 - EST PT ICD-10 Diagnosis Description L97.512 Non-pressure chronic ulcer of other part of right foot with fat layer exposed I73.89 Other specified peripheral vascular diseases Quantity: 1 Electronic Signature(s) Signed: 09/23/2019 7:39:18 AM By: Linton Ham MD Entered By: Linton Ham on 09/21/2019 10:01:48

## 2019-09-24 ENCOUNTER — Ambulatory Visit: Payer: Medicare Other | Admitting: Podiatrist

## 2019-09-26 NOTE — Progress Notes (Signed)
[name], [name] ([id number]) Visit Report for 09/21/2019 Arrival Information Details Patient Name: Date of Service: [place] 09/21/2019 9:00 A M Medical Record Number: [id number] Patient Account Number: [id number] Date of Birth/Sex: Treating RN: [birth year] (59 y.o. [name] Primary Care Mcihael Hinderman: [name] Other Clinician: Referring Donyel Nester: Treating Jordany Russett/Extender: [name] in Treatment: 5 Visit Information History Since Last Visit Added or deleted any medications: No Patient Arrived: Ambulatory Any new allergies or adverse reactions: No Arrival Time: 08:20 Had a fall or experienced change in No Accompanied By: self activities of daily living that may affect Transfer Assistance: None risk of falls: Patient Identification Verified: Yes Signs or symptoms of abuse/neglect since last visito No Secondary Verification Process Completed: Yes Hospitalized since last visit: No Patient Requires Transmission-Based Precautions: No Implantable device outside of the clinic excluding No Patient Has Alerts: Yes cellular tissue based products placed in the center Patient Alerts: Patient on Blood Thinner since last visit: R ABI: 0.85 R TBI: 0.50 Has Dressing in Place as Prescribed: Yes Pain Present Now: Yes Electronic Signature(s) Signed: 09/26/2019 11:00:36 AM By: Sandre Kitty Entered By: Sandre Kitty on 09/21/2019 08:22:34 -------------------------------------------------------------------------------- Encounter Discharge Information Details Patient Name: Date of Service: [name], [name] 09/21/2019 9:00 A M Medical Record Number: [id number] Patient Account Number: [id number] Date of Birth/Sex: Treating RN: 05/30/1960 (59 y.o. Janyth Contes Primary Care Angla Delahunt: [name] Other Clinician: Referring Aysen Shieh: Treating Brenson Hartman/Extender: [name] in Treatment: 5 Encounter  Discharge Information Items Post Procedure Vitals Discharge Condition: Stable Temperature (F): 97.7 Ambulatory Status: Ambulatory Pulse (bpm): 86 Discharge Destination: Home Respiratory Rate (breaths/min): 16 Transportation: Private Auto Blood Pressure (mmHg): 112/72 Accompanied By: alone Schedule Follow-up Appointment: Yes Clinical Summary of Care: Patient Declined Electronic Signature(s) Signed: 09/21/2019 6:01:16 PM By: Levan Hurst RN, BSN Entered By: Levan Hurst on 09/21/2019 09:36:00 -------------------------------------------------------------------------------- Lower Extremity Assessment Details Patient Name: Date of Service: [name]. 09/21/2019 9:00 A M Medical Record Number: [id number] Patient Account Number: [id number] Date of Birth/Sex: Treating RN: [birth year] (59 y.o. Jerilynn Mages) Carlene Coria Primary Care Kasiah Manka: [name] Other Clinician: Referring Oziah Vitanza: Treating Vasiliki Smaldone/Extender: Domenic Polite Weeks in Treatment: 5 Edema Assessment Assessed: Shirlyn Goltz: No] [Right: No] Edema: [Left: N] [Right: o] Calf Left: Right: Point of Measurement: 28 cm From Medial Instep cm 27 cm Ankle Left: Right: Point of Measurement: 9 cm From Medial Instep cm 17 cm Electronic Signature(s) Signed: 09/21/2019 5:49:17 PM By: Carlene Coria RN Entered By: Carlene Coria on 09/21/2019 08:34:38 -------------------------------------------------------------------------------- Multi Wound Chart Details Patient Name: Date of Service: [name] RD, [name] 09/21/2019 9:00 A M Medical Record Number: [id number] Patient Account Number: [id number] Date of Birth/Sex: Treating RN: 02-04-1960 (59 y.o. [name] Primary Care Lorrane Mccay: [name] Other Clinician: Referring Yanel Dombrosky: Treating Galo Sayed/Extender: Domenic Polite Weeks in Treatment: 5 Vital Signs Height(in): 55 Pulse(bpm): 23 Weight(lbs): 130 Blood Pressure(mmHg):  112/72 Body Mass Index(BMI): 24 Temperature(F): 97.7 Respiratory Rate(breaths/min): 16 Photos: [1:No Photos Right, Dorsal Foot] [2:No Photos Right, Lateral, Dorsal Foot] [N/A:N/A N/A] Wound Location: [1:Gradually Appeared] [2:Gradually Appeared] [N/A:N/A] Wounding Event: [1:Diabetic Wound/Ulcer of the Lower] [2:Diabetic Wound/Ulcer of the Lower] [N/A:N/A] Primary Etiology: [1:Extremity Arterial Insufficiency Ulcer] [2:Extremity Arterial Insufficiency Ulcer] [N/A:N/A] Secondary Etiology: [1:Hypertension, Peripheral Arterial] [2:Hypertension, Peripheral Arterial] [N/A:N/A] Comorbid History: [1:Disease, Type II Diabetes, Neuropathy 03/26/2019] [2:Disease, Type II Diabetes, Neuropathy 03/26/2019] [N/A:N/A] Date Acquired: [1:5] [2:5] [N/A:N/A] Weeks of  Treatment: [1:Open] [2:Healed - Epithelialized] [N/A:N/A] Wound Status: [1:1.5x1.3x0.1] [2:0x0x0] [N/A:N/A] Measurements L x W x D (cm) [1:1.532] [2:0] [N/A:N/A] A (cm) : rea [1:0.153] [2:0] [N/A:N/A] Volume (cm) : [1:45.80%] [2:100.00%] [N/A:N/A] % Reduction in Area: [1:45.90%] [2:100.00%] [N/A:N/A] % Reduction in Volume: [1:Grade 2] [2:Grade 2] [N/A:N/A] Classification: [1:Medium] [2:None Present] [N/A:N/A] Exudate A mount: [1:Serosanguineous] [2:N/A] [N/A:N/A] Exudate Type: [1:red, brown] [2:N/A] [N/A:N/A] Exudate Color: [1:Flat and Intact] [2:Flat and Intact] [N/A:N/A] Wound Margin: [1:None Present (0%)] [2:None Present (0%)] [N/A:N/A] Granulation A mount: [1:Large (67-100%)] [2:None Present (0%)] [N/A:N/A] Necrotic A mount: [1:Fascia: No] [2:Fascia: No] [N/A:N/A] Exposed Structures: [1:Fat Layer (Subcutaneous Tissue): No Tendon: No Muscle: No Joint: No Bone: No None] [2:Fat Layer (Subcutaneous Tissue): No Tendon: No Muscle: No Joint: No Bone: No Large (67-100%)] [N/A:N/A] Epithelialization: [1:Chemical/Enzymatic/Mechanical] [2:N/A] [N/A:N/A] Debridement: [1:N/A] [2:N/A] [N/A:N/A] Instrument: [1:None] [2:N/A]  [N/A:N/A] Bleeding: Debridement Treatment Response: Procedure was tolerated well [2:N/A] [N/A:N/A] Post Debridement Measurements L x 1.5x1.3x0.1 [2:N/A] [N/A:N/A] W x D (cm) [1:0.153] [2:N/A] [N/A:N/A] Post Debridement Volume: (cm) [1:Debridement] [2:N/A] [N/A:N/A] Treatment Notes Wound #1 (Right, Dorsal Foot) 1. Cleanse With Wound Cleanser 3. Primary Dressing Applied Santyl 4. Secondary Dressing Dry Gauze Roll Gauze 5. Secured With Recruitment consultant) Signed: 09/21/2019 5:56:33 PM By: Kela Millin Signed: 09/23/2019 7:39:18 AM By: Linton Ham MD Entered By: Linton Ham on 09/21/2019 09:55:52 -------------------------------------------------------------------------------- Multi-Disciplinary Care Plan Details Patient Name: Date of Service: [name] RD, [name] 09/21/2019 9:00 A M Medical Record Number: [id number] Patient Account Number: [id number] Date of Birth/Sex: Treating RN: Apr 12, 1960 (59 y.o. [name] Primary Care [name]: [name] Other Clinician: Referring Sayde Lish: Treating Americus Scheurich/Extender: [name] in Treatment: 5 Active Inactive Nutrition Nursing Diagnoses: Impaired glucose control: actual or potential Potential for alteratiion in Nutrition/Potential for imbalanced nutrition Goals: Patient/caregiver will maintain therapeutic glucose control Date Initiated: 08/15/2019 Target Resolution Date: 10/12/2019 Goal Status: Active Interventions: Assess HgA1c results as ordered upon admission and as needed Assess patient nutrition upon admission and as needed per policy Treatment Activities: Patient referred to Primary Care Physician for further nutritional evaluation : 08/15/2019 Notes: Tissue Oxygenation Nursing Diagnoses: Actual ineffective tissue perfusion; peripheral (select once diagnosis is confirmed) Knowledge deficit related to disease process and management Goals: Patient/caregiver  will verbalize understanding of disease process and disease management Date Initiated: 08/15/2019 Target Resolution Date: 10/12/2019 Goal Status: Active Interventions: Assess patient understanding of disease process and management upon diagnosis and as needed Assess peripheral arterial status upon admission and as needed Provide education on tissue oxygenation and ischemia Treatment Activities: T ordered outside of clinic : 08/15/2019 est Notes: Wound/Skin Impairment Nursing Diagnoses: Impaired tissue integrity Knowledge deficit related to smoking impact on wound healing Knowledge deficit related to ulceration/compromised skin integrity Goals: Patient will demonstrate a reduced rate of smoking or cessation of smoking Date Initiated: 08/15/2019 Target Resolution Date: 10/12/2019 Goal Status: Active Patient/caregiver will verbalize understanding of skin care regimen Date Initiated: 08/15/2019 Target Resolution Date: 10/12/2019 Goal Status: Active Ulcer/skin breakdown will have a volume reduction of 30% by week 4 Date Initiated: 08/15/2019 Date Inactivated: 09/21/2019 Target Resolution Date: 09/12/2019 Goal Status: Met Interventions: Assess patient/caregiver ability to obtain necessary supplies Assess patient/caregiver ability to perform ulcer/skin care regimen upon admission and as needed Assess ulceration(s) every visit Provide education on ulcer and skin care Treatment Activities: Skin care regimen initiated : 08/15/2019 Topical wound management initiated : 08/15/2019 Notes: Electronic Signature(s) Signed: 09/21/2019 5:56:33 PM By: Kela Millin Entered By: Kela Millin on 09/21/2019 09:16:39 --------------------------------------------------------------------------------  Pain Assessment Details Patient Name: Date of Service: [name] 09/21/2019 9:00 A M Medical Record Number: [id number] Patient Account Number: [id number] Date of Birth/Sex: Treating  RN: 12/27/1960 (59 y.o. [name] Primary Care Azjah Pardo: [name] Other Clinician: Referring Francis Doenges: Treating Emyah Roznowski/Extender: [name] in Treatment: 5 Active Problems Location of Pain Severity and Description of Pain Patient Has Paino Yes Site Locations Rate the pain. Current Pain Level: 6 Pain Management and Medication Current Pain Management: Electronic Signature(s) Signed: 09/21/2019 5:56:33 PM By: Kela Millin Signed: 09/26/2019 11:00:36 AM By: Sandre Kitty Entered By: Sandre Kitty on 09/21/2019 08:23:00 -------------------------------------------------------------------------------- Patient/Caregiver Education Details Patient Name: Date of Service: CLINA RD, LO RENZO J. 8/27/2021andnbsp9:00 A M Medical Record Number: [id number] Patient Account Number: [id number] Date of Birth/Gender: Treating RN: 1960/03/24 (59 y.o. [name] Primary Care Physician: [name] Other Clinician: Referring Physician: Treating Physician/Extender: [name] in Treatment: 5 Education Assessment Education Provided To: Patient Education Topics Provided Wound/Skin Impairment: Handouts: Caring for Your Ulcer Methods: Explain/Verbal Responses: State content correctly Electronic Signature(s) Signed: 09/21/2019 5:56:33 PM By: Kela Millin Entered By: Kela Millin on 09/21/2019 09:16:52 -------------------------------------------------------------------------------- Wound Assessment Details Patient Name: Date of Service: [name]. 09/21/2019 9:00 A M Medical Record Number: [id number] Patient Account Number: [id number] Date of Birth/Sex: Treating RN: 09/23/1960 (59 y.o. [name] Primary Care Jonesha Tsuchiya: [name] Other Clinician: Referring Shenika Quint: Treating Lucella Pommier/Extender: Domenic Polite Weeks in Treatment: 5 Wound  Status Wound Number: 1 Primary Diabetic Wound/Ulcer of the Lower Extremity Etiology: Wound Location: Right, Dorsal Foot Secondary Arterial Insufficiency Ulcer Wounding Event: Gradually Appeared Etiology: Date Acquired: 03/26/2019 Wound Status: Open Weeks Of Treatment: 5 Comorbid Hypertension, Peripheral Arterial Disease, Type II Diabetes, Clustered Wound: No History: Neuropathy Photos Photo Uploaded By: Mikeal Hawthorne on 09/24/2019 10:22:47 Wound Measurements Length: (cm) 1.5 % Redu Width: (cm) 1.3 % Redu Depth: (cm) 0.1 Epithe Area: (cm) 1.532 [place] Volume: (cm) 0.153 Under ction in Area: 45.8% ction in Volume: 45.9% lialization: None ling: No mining: No Wound Description Classification: Grade 2 Foul O Wound Margin: Flat and Intact Slough Exudate Amount: Medium Exudate Type: Serosanguineous Exudate Color: red, brown dor After Cleansing: No /Fibrino Yes Wound Bed Granulation Amount: None Present (0%) Exposed Structure Necrotic Amount: Large (67-100%) Fascia Exposed: No Necrotic Quality: Adherent Slough Fat Layer (Subcutaneous Tissue) Exposed: No Tendon Exposed: No Muscle Exposed: No Joint Exposed: No Bone Exposed: No Treatment Notes Wound #1 (Right, Dorsal Foot) 1. Cleanse With Wound Cleanser 3. Primary Dressing Applied Santyl 4. Secondary Dressing Dry Gauze Roll Gauze 5. Secured With Recruitment consultant) Signed: 09/21/2019 5:49:17 PM By: Carlene Coria RN Signed: 09/21/2019 5:56:33 PM By: Kela Millin Entered By: Carlene Coria on 09/21/2019 08:35:02 -------------------------------------------------------------------------------- Wound Assessment Details Patient Name: Date of Service: [name], [name] 09/21/2019 9:00 A M Medical Record Number: [id number] Patient Account Number: [id number] Date of Birth/Sex: Treating RN: 01/11/1961 (59 y.o. [name] Primary Care Chrystina Naff: [name] Other Clinician: Referring  Lenee Franze: Treating Rica Heather/Extender: Domenic Polite Weeks in Treatment: 5 Wound Status Wound Number: 2 Primary Diabetic Wound/Ulcer of the Lower Extremity Etiology: Wound Location: Right, Lateral, Dorsal Foot Secondary Arterial Insufficiency Ulcer Wounding Event: Gradually Appeared Etiology: Date Acquired: 03/26/2019 Wound Status: Healed - Epithelialized Weeks Of Treatment: 5 Comorbid Hypertension, Peripheral Arterial Disease, Type II Diabetes, Clustered Wound: No History: Neuropathy Wound Measurements Length: (cm) Width: (cm) Depth: (  cm) Area: (cm) Volume: (cm) 0 % Reduction in Area: 100% 0 % Reduction in Volume: 100% 0 Epithelialization: Large (67-100%) 0 Tunneling: No 0 Undermining: No Wound Description Classification: Grade 2 Wound Margin: Flat and Intact Exudate Amount: None Present Foul Odor After Cleansing: No Slough/Fibrino No Wound Bed Granulation Amount: None Present (0%) Exposed Structure Necrotic Amount: None Present (0%) Fascia Exposed: No Fat Layer (Subcutaneous Tissue) Exposed: No Tendon Exposed: No Muscle Exposed: No Joint Exposed: No Bone Exposed: No Electronic Signature(s) Signed: 09/21/2019 5:56:33 PM By: Kela Millin Entered By: Kela Millin on 09/21/2019 09:15:10 -------------------------------------------------------------------------------- [place] Details Patient Name: Date of Service: [name] RD, [name] 09/21/2019 9:00 A M Medical Record Number: [id number] Patient Account Number: [id number] Date of Birth/Sex: Treating RN: 09/01/1960 (59 y.o. [name] Primary Care Abdulahi Schor: [name] Other Clinician: Referring November Sypher: Treating Sharma Lawrance/Extender: [name] in Treatment: 5 Vital Signs Time Taken: 08:22 Temperature (F): 97.7 Height (in): 62 Pulse (bpm): 86 Weight (lbs): 130 Respiratory Rate (breaths/min): 16 Body Mass Index (BMI): 23.8 Blood Pressure  (mmHg): 112/72 Reference Range: 80 - 120 mg / dl Electronic Signature(s) Signed: 09/26/2019 11:00:36 AM By: Sandre Kitty Entered By: Sandre Kitty on 09/21/2019 08:22:49

## 2019-10-05 ENCOUNTER — Encounter (HOSPITAL_BASED_OUTPATIENT_CLINIC_OR_DEPARTMENT_OTHER): Payer: Medicare Other | Attending: Internal Medicine | Admitting: Internal Medicine

## 2019-10-05 DIAGNOSIS — E11621 Type 2 diabetes mellitus with foot ulcer: Secondary | ICD-10-CM | POA: Insufficient documentation

## 2019-10-05 DIAGNOSIS — E1140 Type 2 diabetes mellitus with diabetic neuropathy, unspecified: Secondary | ICD-10-CM | POA: Diagnosis not present

## 2019-10-05 DIAGNOSIS — F17218 Nicotine dependence, cigarettes, with other nicotine-induced disorders: Secondary | ICD-10-CM | POA: Insufficient documentation

## 2019-10-05 DIAGNOSIS — E1151 Type 2 diabetes mellitus with diabetic peripheral angiopathy without gangrene: Secondary | ICD-10-CM | POA: Insufficient documentation

## 2019-10-05 DIAGNOSIS — I10 Essential (primary) hypertension: Secondary | ICD-10-CM | POA: Diagnosis not present

## 2019-10-05 DIAGNOSIS — L97512 Non-pressure chronic ulcer of other part of right foot with fat layer exposed: Secondary | ICD-10-CM | POA: Diagnosis not present

## 2019-10-08 NOTE — Progress Notes (Signed)
[name], [name] ([id number]) Visit Report for 10/05/2019 Arrival Information Details Patient Name: Date of Service: [place] 10/05/2019 9:15 A M Medical Record Number: [id number] Patient Account Number: [id number] Date of Birth/Sex: Treating RN: 11-30-1960 (59 y.o. [name] Primary Care Ryen Rhames: [name] Other Clinician: Referring Rashiya Lofland: Treating Laterra Lubinski/Extender: [name] in Treatment: 7 Visit Information History Since Last Visit Added or deleted any medications: No Patient Arrived: Ambulatory Any new allergies or adverse reactions: No Arrival Time: 08:59 Had a fall or experienced change in No Accompanied By: self activities of daily living that may affect Transfer Assistance: None risk of falls: Patient Identification Verified: Yes Signs or symptoms of abuse/neglect since last visito No Secondary Verification Process Completed: Yes Hospitalized since last visit: No Patient Requires Transmission-Based Precautions: No Implantable device outside of the clinic excluding No Patient Has Alerts: Yes cellular tissue based products placed in the center Patient Alerts: Patient on Blood Thinner since last visit: R ABI: 0.85 R TBI: 0.50 Has Dressing in Place as Prescribed: Yes Pain Present Now: Yes Electronic Signature(s) Signed: 10/05/2019 11:28:52 AM By: Sandre Kitty Entered By: Sandre Kitty on 10/05/2019 08:59:29 -------------------------------------------------------------------------------- Encounter Discharge Information Details Patient Name: Date of Service: Ruben Leavell J. 10/05/2019 9:15 A M Medical Record Number: [id number] Patient Account Number: [id number] Date of Birth/Sex: Treating RN: 1960/12/30 (59 y.o. [name] Primary Care Mennie Spiller: [name] Other Clinician: Referring Gerturde Kuba: Treating Aleeyah Bensen/Extender: [name] in Treatment: 7 Encounter  Discharge Information Items Post Procedure Vitals Discharge Condition: Stable Temperature (F): 98.2 Ambulatory Status: Ambulatory Pulse (bpm): 101 Discharge Destination: Home Respiratory Rate (breaths/min): 16 Transportation: Private Auto Blood Pressure (mmHg): 143/88 Accompanied By: self Schedule Follow-up Appointment: Yes Clinical Summary of Care: Electronic Signature(s) Signed: 10/05/2019 4:55:25 PM By: Deon Pilling Entered By: Deon Pilling on 10/05/2019 09:54:46 -------------------------------------------------------------------------------- Lower Extremity Assessment Details Patient Name: Date of Service: [name] 10/05/2019 9:15 A M Medical Record Number: [id number] Patient Account Number: [id number] Date of Birth/Sex: Treating RN: 04-03-1960 (59 y.o. [name] Primary Care Kenosha Doster: [name] Other Clinician: Referring Amarra Sawyer: Treating Dalonda Simoni/Extender: Domenic Polite Weeks in Treatment: 7 Edema Assessment Assessed: [Left: No] [Right: No] Edema: [Left: N] [Right: o] Calf Left: Right: Point of Measurement: 28 cm From Medial Instep cm 27 cm Ankle Left: Right: Point of Measurement: 9 cm From Medial Instep cm 17 cm Vascular Assessment Pulses: Dorsalis Pedis Palpable: [Right:No] Electronic Signature(s) Signed: 10/05/2019 4:47:07 PM By: Baruch Gouty RN, BSN Entered By: Baruch Gouty on 10/05/2019 09:07:01 -------------------------------------------------------------------------------- Multi Wound Chart Details Patient Name: Date of Service: Ruben Leavell J. 10/05/2019 9:15 A M Medical Record Number: [id number] Patient Account Number: [id number] Date of Birth/Sex: Treating RN: September 13, 1960 (59 y.o. 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] [N/A:N/A] Secondary Etiology: [1:Hypertension, Peripheral Arterial] [N/A:N/A] Comorbid History: [1:Disease, Type II Diabetes, Neuropathy 03/26/2019] [N/A:N/A] Date Acquired: [1:7] [N/A:N/A] Weeks of Treatment: [1:Open] [N/A:N/A] Wound Status: [1:1.3x1.4x0.1] [N/A:N/A] Measurements L x W x D (cm) [1:1.429] [N/A:N/A] A (cm) : rea [1:0.143] [N/A:N/A] Volume (  cm) : [1:49.50%] [N/A:N/A] % Reduction in A rea: [1:49.50%] [N/A:N/A] % Reduction in Volume: [1:Grade 2] [N/A:N/A] Classification: [1:None Present] [N/A:N/A] Exudate A mount: [1:Flat and Intact] [N/A:N/A] Wound Margin: [1:None Present (0%)] [N/A:N/A] Granulation A mount: [1:Large (67-100%)] [N/A:N/A] Necrotic A mount: [1:Fat Layer (Subcutaneous Tissue): Yes N/A] Exposed Structures: [1:Fascia: No Tendon: No Muscle: No Joint: No Bone: No None] [N/A:N/A] Epithelialization: [1:Debridement - Excisional] [N/A:N/A] Debridement: Pre-procedure Verification/Time Out 09:44 [N/A:N/A] Taken: [1:Other] [N/A:N/A] Pain Control: [1:Subcutaneous] [N/A:N/A] Tissue Debrided: [1:Skin/Subcutaneous Tissue] [N/A:N/A] Level: [1:1.82] [N/A:N/A] Debridement A (sq cm): [1:rea Curette] [N/A:N/A] Instrument: [1:Minimum] [N/A:N/A] Bleeding: [1:0] [N/A:N/A] Procedural Pain: [1:0] [N/A:N/A] Post Procedural Pain: [1:Procedure was tolerated well] [N/A:N/A] Debridement Treatment Response: [1:1.3x1.4x0.1] [N/A:N/A] Post Debridement Measurements L x W x D (cm) [1:0.143] [N/A:N/A] Post Debridement Volume: (cm) [1:Debridement] [N/A:N/A] Treatment Notes Wound #1 (Right, Dorsal Foot) 1.  Cleanse With Wound Cleanser 2. Periwound Care Skin Prep 3. Primary Dressing Applied Santyl 4. Secondary Dressing Foam Border Dressing 5. Secured With Office manager) Signed: 10/05/2019 4:40:23 PM By: Kela Millin Signed: 10/08/2019 7:26:40 PM By: Linton Ham MD Entered By: Linton Ham on 10/05/2019 10:14:00 -------------------------------------------------------------------------------- Multi-Disciplinary Care Plan Details Patient Name: Date of Service: [place] RD, [name] 10/05/2019 9:15 A M Medical Record Number: [id number] Patient Account Number: [id number] Date of Birth/Sex: Treating RN: May 02, 1960 (59 y.o. [name] Primary Care Naziya Hegwood: [name] Other Clinician: Referring Neoma Uhrich: Treating Eneida Evers/Extender: [name] in Treatment: 7 Active Inactive Nutrition Nursing Diagnoses: Impaired glucose control: actual or potential Potential for alteratiion in Nutrition/Potential for imbalanced nutrition Goals: Patient/caregiver will maintain therapeutic glucose control Date Initiated: 08/15/2019 Target Resolution Date: 10/12/2019 Goal Status: Active Interventions: Assess HgA1c results as ordered upon admission and as needed Assess patient nutrition upon admission and as needed per policy Treatment Activities: Patient referred to Primary Care Physician for further nutritional evaluation : 08/15/2019 Notes: Tissue Oxygenation Nursing Diagnoses: Actual ineffective tissue perfusion; peripheral (select once diagnosis is confirmed) Knowledge deficit related to disease process and management Goals: Patient/caregiver will verbalize understanding of disease process and disease management Date Initiated: 08/15/2019 Target Resolution Date: 10/12/2019 Goal Status: Active Interventions: Assess patient understanding of disease process and management upon diagnosis and as needed Assess peripheral  arterial status upon admission and as needed Provide education on tissue oxygenation and ischemia Treatment Activities: T ordered outside of clinic : 08/15/2019 est Notes: Wound/Skin Impairment Nursing Diagnoses: Impaired tissue integrity Knowledge deficit related to smoking impact on wound healing Knowledge deficit related to ulceration/compromised skin integrity Goals: Patient will demonstrate a reduced rate of smoking or cessation of smoking Date Initiated: 08/15/2019 Target Resolution Date: 10/12/2019 Goal Status: Active Patient/caregiver will verbalize understanding of skin care regimen Date Initiated: 08/15/2019 Target Resolution Date: 10/12/2019 Goal Status: Active Ulcer/skin breakdown will have a volume reduction of 30% by week 4 Date Initiated: 08/15/2019 Date Inactivated: 09/21/2019 Target Resolution Date: 09/12/2019 Goal Status: Met Interventions: Assess patient/caregiver ability to obtain necessary supplies Assess patient/caregiver ability to perform ulcer/skin care regimen upon admission and as needed Assess ulceration(s) every visit Provide education on ulcer and skin care Treatment Activities: Skin care regimen initiated : 08/15/2019 Topical wound management initiated : 08/15/2019 Notes: Electronic Signature(s) Signed: 10/05/2019 4:40:23 PM By: Kela Millin Entered By: Kela Millin on 10/05/2019 09:39:02 -------------------------------------------------------------------------------- Pain Assessment Details Patient Name: Date of Service: [name]. 10/05/2019 9:15 A M Medical Record Number: [id number] Patient Account Number: [id number] Date of Birth/Sex: Treating RN: [birth year] (59 y.o. M) Dwiggins,  Larene Beach Primary Care Fatisha Rabalais: [name] Other Clinician: Referring Araiya Tilmon: Treating Elice Crigger/Extender: [name] in Treatment: 7 Active Problems Location of Pain Severity and Description of Pain Patient Has  Paino Yes Site Locations Pain Location: Pain in Ulcers With Dressing Change: Yes Duration of the Pain. Constant / Intermittento Constant Rate the pain. Current Pain Level: 7 Worst Pain Level: 10 Least Pain Level: 5 Character of Pain Describe the Pain: Aching, Burning, Throbbing, Other: tingling Pain Management and Medication Current Pain Management: Medication: Yes Other: leg drop Is the Current Pain Management Adequate: Inadequate How does your wound impact your activities of daily livingo Sleep: Yes Bathing: No Appetite: No Relationship With Others: No Bladder Continence: No Emotions: Yes Bowel Continence: No Drive: No Toileting: No Hobbies: Yes Dressing: No Electronic Signature(s) Signed: 10/05/2019 4:40:23 PM By: Kela Millin Signed: 10/05/2019 4:47:07 PM By: Baruch Gouty RN, BSN Entered By: Baruch Gouty on 10/05/2019 09:06:24 -------------------------------------------------------------------------------- Patient/Caregiver Education Details Patient Name: Date of Service: [name] 9/10/2021andnbsp9:15 A M Medical Record Number: [id number] Patient Account Number: [id number] Date of Birth/Gender: Treating RN: Jul 01, 1960 (59 y.o. [name] Primary Care Physician: [name] Other Clinician: Referring Physician: Treating Physician/Extender: [name] in Treatment: 7 Education Assessment Education Provided To: Patient Education Topics Provided Wound/Skin Impairment: Handouts: Caring for Your Ulcer Methods: Explain/Verbal Responses: State content correctly Electronic Signature(s) Signed: 10/05/2019 4:40:23 PM By: Kela Millin Entered By: Kela Millin on 10/05/2019 09:39:15 -------------------------------------------------------------------------------- Wound Assessment Details Patient Name: Date of Service: [name]. 10/05/2019 9:15 A M Medical Record Number:  [id number] Patient Account Number: [id number] Date of Birth/Sex: Treating RN: 1960-10-13 (59 y.o. [name] Primary Care Arisha Gervais: [name] Other Clinician: Referring Ginni Eichler: Treating Britt Petroni/Extender: Domenic Polite Weeks in Treatment: 7 Wound Status Wound Number: 1 Primary Diabetic Wound/Ulcer of the Lower Extremity Etiology: Wound Location: Right, Dorsal Foot Secondary Arterial Insufficiency Ulcer Wounding Event: Gradually Appeared Etiology: Date Acquired: 03/26/2019 Wound Status: Open Weeks Of Treatment: 7 Comorbid Hypertension, Peripheral Arterial Disease, Type II Diabetes, Clustered Wound: No History: Neuropathy Wound Measurements Length: (cm) 1.3 Width: (cm) 1.4 Depth: (cm) 0.1 Area: (cm) 1.429 Volume: (cm) 0.143 % Reduction in Area: 49.5% % Reduction in Volume: 49.5% Epithelialization: None Tunneling: No Undermining: No Wound Description Classification: Grade 2 Wound Margin: Flat and Intact Exudate Amount: None Present Foul Odor After Cleansing: No Slough/Fibrino Yes Wound Bed Granulation Amount: None Present (0%) Exposed Structure Necrotic Amount: Large (67-100%) Fascia Exposed: No Necrotic Quality: Adherent Slough Fat Layer (Subcutaneous Tissue) Exposed: Yes Tendon Exposed: No Muscle Exposed: No Joint Exposed: No Bone Exposed: No Treatment Notes Wound #1 (Right, Dorsal Foot) 1. Cleanse With Wound Cleanser 2. Periwound Care Skin Prep 3. Primary Dressing Applied Santyl 4. Secondary Dressing Foam Border Dressing 5. Secured With Office manager) Signed: 10/05/2019 4:40:23 PM By: Kela Millin Signed: 10/05/2019 4:47:07 PM By: Baruch Gouty RN, BSN Entered By: Baruch Gouty on 10/05/2019 09:07:28 -------------------------------------------------------------------------------- Vitals Details Patient Name: Date of Service: [name], [name] 10/05/2019 9:15 A M Medical  Record Number: [id number] Patient Account Number: [id number] Date of Birth/Sex: Treating RN: 10/31/1960 (59 y.o. [name] Primary Care Neita Landrigan: [name] Other Clinician: Referring Autymn Omlor: Treating Yarelly Kuba/Extender: [name] in Treatment: 7 Vital Signs Time Taken: 08:59 Temperature (F): 98.2 Height (in): 62 Pulse (bpm): 101 Weight (lbs): 130 Respiratory Rate (breaths/min): 16 Body Mass Index (BMI):  23.8 Blood Pressure (mmHg): 143/88 Reference Range: 80 - 120 mg / dl Electronic Signature(s) Signed: 10/05/2019 11:28:52 AM By: Sandre Kitty Entered By: Sandre Kitty on 10/05/2019 08:59:42

## 2019-10-08 NOTE — Progress Notes (Signed)
[name], [name] ([id number]) Visit Report for 10/05/2019 Debridement Details Patient Name: Date of Service: [place] 10/05/2019 9:15 A M Medical Record Number: [id number] Patient Account Number: [id number] Date of Birth/Sex: Treating RN: July 09, 1960 (59 y.o. [name] Other Clinician: Referring Provider: Treating Provider/Extender: [name] in Treatment: 7 Debridement Performed for Assessment: Wound #1 Right,Dorsal Foot Performed By: Physician [name]., MD Debridement Type: Debridement Severity of Tissue Pre Debridement: Fat layer exposed Level of Consciousness (Pre-procedure): Awake and Alert Pre-procedure Verification/Time Out Yes - 09:44 Taken: Start Time: 09:44 Pain Control: Other : benzocaine, 20% T Area Debrided (L x W): otal 1.3 (cm) x 1.4 (cm) = 1.82 (cm) Tissue and other material debrided: Viable, Non-Viable, Subcutaneous, Fibrin/Exudate Level: Skin/Subcutaneous Tissue Debridement Description: Excisional Instrument: Curette Bleeding: Minimum End Time: 09:45 Procedural Pain: 0 Post Procedural Pain: 0 Response to Treatment: Procedure was tolerated well Level of Consciousness (Post- Awake and Alert procedure): Post Debridement Measurements of Total Wound Length: (cm) 1.3 Width: (cm) 1.4 Depth: (cm) 0.1 Volume: (cm) 0.143 Character of Wound/Ulcer Post Debridement: Improved Severity of Tissue Post Debridement: Fat layer exposed Post Procedure Diagnosis Same as Pre-procedure Electronic Signature(s) Signed: 10/05/2019 4:40:23 PM By: [name] Signed: 10/08/2019 7:26:40 PM By: Ruben Ham MD Entered By: [name] on 10/05/2019 10:14:12 -------------------------------------------------------------------------------- HPI Details Patient Name: Date of Service: [name], Ruben Kalata J. 10/05/2019 9:15 A M Medical Record Number: [id number] Patient Account  Number: [id number] Date of Birth/Sex: Treating RN: [birth year] (59 y.o. [name] Other Clinician: Referring Provider: Treating Provider/Extender: [name] in Treatment: 7 History of Present Illness HPI Description: 08/15/2019 upon evaluation today patient presents for evaluation here in our clinic concerning issues that he is having with wounds on the dorsal surface of his right foot. This is something that was noted to occur after he was obtaining cortisone injections with [facility]. Subsequently he developed ulcerations he was then referred to Dr. Alvester Chou who performed arterial testing and subsequently found that the patient had poor arterial flow with an ABI of 0.6 and a TBI of 0.26. With that being said he has since on 07/16/2019 had an angiogram on the right with improvement of his ABI to 0.85 and TBI to 0.5. Obviously this is not perfect but is definitely far superior to where it was previous. He does have some necrotic tissue on the surface of the wounds is going to need debriding away he has been using Silvadene on this but to be honest that is not can to do anything for these wounds. He needs something more to clear this away sharp debridement is ideal and we may even look towards Santyl as well. The patient does have a history of diabetes noted in his chart though is not on medication and his A1c was around 6.4 so is not terrible. With that being said he does have known peripheral vascular disease he is actually have an angiogram on the left tomorrow. He also has hypertension and he does smoke. 8/27; this is a patient has been here once before about 6 Huang ago. Since then he was hospitalized from 7/22 through 7/23 by Dr. Gwenlyn Found for an attempt at revascularization. He had previously been seen by Dr. Amalia Hailey of podiatry. He is a continued tobacco smoker. His angiogram on 6/21 via revealed a occluded SFAs  bilaterally with one-vessel runoff. He had a  atherectomy followed by a drug-coated balloon angioplasty of the right SFA. 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 He had some improvement in his right ABI from 0.6-0.85. There is also an attempt to open his left SFA but that was unsuccessful. He does not have a wound on the left side. We are using Santyl on the patient's wounds on the right dorsal foot. He says he has about a 1 minute exercise tolerance before stopping because of pain compatible with claudication 9/10; we are using Santyl on the wound on the right dorsal foot. Still has significant claudication symptoms. He is status post angioplasty of the right SFA and atherectomy. He has one-vessel runoff via a diseased peroneal. He follows up with Dr. Gwenlyn Found on 9/14 Electronic Signature(s) Signed: 10/08/2019 7:26:40 PM By: Ruben Ham MD Entered By: [name] on 10/05/2019 10:15:52 -------------------------------------------------------------------------------- Physical Exam Details Patient Name: Date of Service: Ruben Shell. 10/05/2019 9:15 A M Medical Record Number: [id number] Patient Account Number: [id number] Date of Birth/Sex: Treating RN: 1960-07-19 (59 y.o. [name] Other Clinician: Referring Provider: Treating Provider/Extender: [name] in Treatment: 7 Constitutional Sitting or standing Blood Pressure is within target range for patient.. Pulse regular and within target range for patient.Marland Kitchen Respirations regular, non-labored and within target range.Marland Kitchen Appears in no distress. Cardiovascular Dorsalis pedis pulses palpable posterior tibial was not. Faint popliteal pulse.. Notes Wound exam; the patient has the 2 wounds on the dorsal foot. Dime sized area dorsally with a small satellite laterally. Necrotic debris on the surface removed with a #3 curette eschar  around the circumference. Minimal bleeding controlled with direct pressure. Electronic Signature(s) Signed: 10/08/2019 7:26:40 PM By: Ruben Ham MD Entered By: [name] on 10/05/2019 10:17:02 -------------------------------------------------------------------------------- Physician Orders Details Patient Name: Date of Service: [name], Ruben Kalata J. 10/05/2019 9:15 A M Medical Record Number: [id number] Patient Account Number: [id number] Date of Birth/Sex: Treating RN: 1960/08/10 (59 y.o. [name] Other Clinician: Referring Provider: Treating Provider/Extender: [name] in Treatment: 7 Verbal / Phone Orders: No Diagnosis Coding ICD-10 Coding Code Description I73.89 Other specified peripheral vascular diseases E11.621 Type 2 diabetes mellitus with foot ulcer L97.512 Non-pressure chronic ulcer of other part of right foot with fat layer exposed I10 Essential (primary) hypertension F17.218 Nicotine dependence, cigarettes, with other nicotine-induced disorders Follow-up Appointments Return Appointment in 1 week. Dressing Change Frequency Wound #1 Right,Dorsal Foot Change dressing every day. Wound Cleansing Wound #1 Right,Dorsal Foot May shower and wash wound with soap and water. Primary Wound Dressing Wound #1 Right,Dorsal Foot Santyl Ointment Secondary Dressing Wound #1 Right,Dorsal Foot Kerlix/Rolled Gauze Dry Gauze Electronic Signature(s) Signed: 10/05/2019 4:40:23 PM By: [name] Signed: 10/08/2019 7:26:40 PM By: Ruben Ham MD Entered By: [name] on 10/05/2019 09:47:30 -------------------------------------------------------------------------------- Problem List Details Patient Name: Date of Service: [name], Ruben Kalata J. 10/05/2019 9:15 A M Medical Record Number: [id number] Patient Account Number: [id number] Date of Birth/Sex: Treating RN: Oct 08, 1960 (59 y.o. [name] Other Clinician: Referring Provider: Treating Provider/Extender: [name] in Treatment: 7 Active Problems ICD-10 Encounter Code Description Active Date MDM Diagnosis I73.89 Other specified peripheral vascular diseases 08/15/2019 No Yes E11.621 Type 2 diabetes mellitus with foot ulcer 08/15/2019 No Yes L97.512 Non-pressure chronic ulcer of other part of right foot with fat layer  exposed 08/15/2019 No Yes I10 Essential (primary) hypertension 08/15/2019 No Yes F17.218 Nicotine dependence, cigarettes, with other nicotine-induced disorders 08/15/2019 No Yes Inactive Problems Resolved Problems Electronic Signature(s) Signed: 10/08/2019 7:26:40 PM By: Ruben Ham MD Entered By: [name] on 10/05/2019 10:13:50 -------------------------------------------------------------------------------- Progress Note Details Patient Name: Date of Service: [name], Ruben Kalata J. 10/05/2019 9:15 A M Medical Record Number: [id number] Patient Account Number: [id number] Date of Birth/Sex: Treating RN: 03-12-1960 (59 y.o. [name] Other Clinician: Referring Provider: Treating Provider/Extender: [name] in Treatment: 7 Subjective History of Present Illness (HPI) 08/15/2019 upon evaluation today patient presents for evaluation here in our clinic concerning issues that he is having with wounds on the dorsal surface of his right foot. This is something that was noted to occur after he was obtaining cortisone injections with [facility]. Subsequently he developed ulcerations he was then referred to Dr. Alvester Chou who performed arterial testing and subsequently found that the patient had poor arterial flow with an ABI of 0.6 and a TBI of 0.26. With that being said he has since on 07/16/2019 had an angiogram on the right with improvement of his ABI  to 0.85 and TBI to 0.5. Obviously this is not perfect but is definitely far superior to where it was previous. He does have some necrotic tissue on the surface of the wounds is going to need debriding away he has been using Silvadene on this but to be honest that is not can to do anything for these wounds. He needs something more to clear this away sharp debridement is ideal and we may even look towards Santyl as well. The patient does have a history of diabetes noted in his chart though is not on medication and his A1c was around 6.4 so is not terrible. With that being said he does have known peripheral vascular disease he is actually have an angiogram on the left tomorrow. He also has hypertension and he does smoke. 8/27; this is a patient has been here once before about 6 Huang ago. Since then he was hospitalized from 7/22 through 7/23 by Dr. Gwenlyn Found for an attempt at revascularization. He had previously been seen by Dr. Amalia Hailey of podiatry. He is a continued tobacco smoker. His angiogram on 6/21 via revealed a occluded SFAs bilaterally with one-vessel runoff. He had a atherectomy followed by a drug-coated balloon angioplasty of the right SFA. 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 He had some improvement in his right ABI from 0.6-0.85. There is also an attempt to open his left SFA but that was unsuccessful. He does not have a wound on the left side. We are using Santyl on the patient's wounds on the right dorsal foot. He says he has about a 1 minute exercise tolerance before stopping because of pain compatible with claudication 9/10; we are using Santyl on the wound on the right dorsal foot. Still has significant claudication symptoms. He is status post angioplasty of the right SFA and atherectomy. He has one-vessel runoff via a diseased peroneal. He follows up with Dr. Gwenlyn Found on 9/14 Objective Constitutional Sitting or standing Blood Pressure is within target range  for patient.. Pulse regular and within target range for patient.Marland Kitchen Respirations regular, non-labored and within target range.Marland Kitchen Appears in no distress. Vitals Time Taken: 8:59 AM, Height: 62 in, Weight: 130 lbs, BMI: 23.8, Temperature: 98.2 F, Pulse: 101 bpm, Respiratory Rate: 16 breaths/min,  Blood Pressure: 143/88 mmHg. Cardiovascular Dorsalis pedis pulses palpable posterior tibial was not. Faint popliteal pulse.. General Notes: Wound exam; the patient has the 2 wounds on the dorsal foot. Dime sized area dorsally with a small satellite laterally. Necrotic debris on the surface removed with a #3 curette eschar around the circumference. Minimal bleeding controlled with direct pressure. Integumentary (Hair, Skin) Wound #1 status is Open. Original cause of wound was Gradually Appeared. The wound is located on the Right,Dorsal Foot. The wound measures 1.3cm length x 1.4cm width x 0.1cm depth; 1.429cm^2 area and 0.143cm^3 volume. There is Fat Layer (Subcutaneous Tissue) exposed. There is no tunneling or undermining noted. There is a none present amount of drainage noted. The wound margin is flat and intact. There is no granulation within the wound bed. There is a large (67- 100%) amount of necrotic tissue within the wound bed including Adherent Slough. Assessment Active Problems ICD-10 Other specified peripheral vascular diseases Type 2 diabetes mellitus with foot ulcer Non-pressure chronic ulcer of other part of right foot with fat layer exposed Essential (primary) hypertension Nicotine dependence, cigarettes, with other nicotine-induced disorders Procedures Wound #1 Pre-procedure diagnosis of Wound #1 is a Diabetic Wound/Ulcer of the Lower Extremity located on the Right,Dorsal Foot .Severity of Tissue Pre Debridement is: Fat layer exposed. There was a Excisional Skin/Subcutaneous Tissue Debridement with a total area of 1.82 sq cm performed by [name]., MD. With the following  instrument(s): Curette to remove Viable and Non-Viable tissue/material. Material removed includes Subcutaneous Tissue and Fibrin/Exudate and after achieving pain control using Other (benzocaine, 20%). No specimens were taken. A time out was conducted at 09:44, prior to the start of the procedure. A Minimum amount of bleeding was controlled with N/A. The procedure was tolerated well with a pain level of 0 throughout and a pain level of 0 following the procedure. Post Debridement Measurements: 1.3cm length x 1.4cm width x 0.1cm depth; 0.143cm^3 volume. Character of Wound/Ulcer Post Debridement is improved. Severity of Tissue Post Debridement is: Fat layer exposed. Post procedure Diagnosis Wound #1: Same as Pre-Procedure Plan Follow-up Appointments: Return Appointment in 1 week. Dressing Change Frequency: Wound #1 Right,Dorsal Foot: Change dressing every day. Wound Cleansing: Wound #1 Right,Dorsal Foot: May shower and wash wound with soap and water. Primary Wound Dressing: Wound #1 Right,Dorsal Foot: Santyl Ointment Secondary Dressing: Wound #1 Right,Dorsal Foot: Kerlix/Rolled Gauze Dry Gauze 1. Continue with Santyl ointment. If I can maintain the wound surface in the reasonably healthy state I may try to do something more aggressive with this area. 2. He has a dorsalis pedis pulse. Hopefully enough perfusion to heal this. 3. He sees Dr. Gwenlyn Found on 9/14. By my recollection it was not felt that there was anything that could be done with his tibial vessels. Electronic Signature(s) Signed: 10/08/2019 7:26:40 PM By: Ruben Ham MD Entered By: [name] on 10/05/2019 10:18:05 -------------------------------------------------------------------------------- SuperBill Details Patient Name: Date of Service: [name], [name] 10/05/2019 Medical Record Number: [id number] Patient Account Number: [id number] Date of Birth/Sex: Treating RN: 1960-03-13 (59 y.o. [name] Primary  Care Provider: Twanna Huang Other Clinician: Referring Provider: Treating Provider/Extender: [name] in Treatment: 7 Diagnosis Coding ICD-10 Codes Code Description I73.89 Other specified peripheral vascular diseases E11.621 Type 2 diabetes mellitus with foot ulcer L97.512 Non-pressure chronic ulcer of other part of right foot with fat layer exposed I10 Essential (primary) hypertension F17.218 Nicotine dependence, cigarettes, with other nicotine-induced disorders Facility Procedures CPT4 Code: [id number] Description:  11042 - DEB SUBQ TISSUE 20 SQ CM/< ICD-10 Diagnosis Description L97.512 Non-pressure chronic ulcer of other part of right foot with fat layer exposed Modifier: Quantity: 1 Physician Procedures : CPT4 Code Description Modifier [id number] 11042 - WC PHYS SUBQ TISS 20 SQ CM ICD-10 Diagnosis Description L97.512 Non-pressure chronic ulcer of other part of right foot with fat layer exposed Quantity: 1 Electronic Signature(s) Signed: 10/08/2019 7:26:40 PM By: Ruben Ham MD Entered By: [name] on 10/05/2019 10:18:15

## 2019-10-12 ENCOUNTER — Encounter (HOSPITAL_BASED_OUTPATIENT_CLINIC_OR_DEPARTMENT_OTHER): Payer: Medicare Other | Admitting: Internal Medicine

## 2019-10-12 ENCOUNTER — Other Ambulatory Visit: Payer: Self-pay

## 2019-10-12 DIAGNOSIS — E11621 Type 2 diabetes mellitus with foot ulcer: Secondary | ICD-10-CM | POA: Diagnosis not present

## 2019-10-15 NOTE — Progress Notes (Signed)
ORIN, EBERWEIN ([id number]) Visit Report for 10/12/2019 Debridement Details Patient Name: Date of Service: [place] 10/12/2019 9:15 A M Medical Record Number: [id number] Patient Account Number: [id number] Date of Birth/Sex: Treating RN: Aug 10, 1960 (59 y.o. [name] Primary Care Provider: [name] Other Clinician: Referring Provider: Treating Provider/Extender: [name] in Treatment: 8 Debridement Performed for Assessment: Wound #1 Right,Dorsal Foot Performed By: Physician Ricard Dillon., MD Debridement Type: Chemical/Enzymatic/Mechanical Agent Used: Santyl Severity of Tissue Pre Debridement: Fat layer exposed Level of Consciousness (Pre-procedure): Awake and Alert Pre-procedure Verification/Time Out No Taken: Bleeding: None Response to Treatment: Procedure was tolerated well Level of Consciousness (Post- Awake and Alert procedure): Post Debridement Measurements of Total Wound Length: (cm) 1 Width: (cm) 1.3 Depth: (cm) 0.2 Volume: (cm) 0.204 Character of Wound/Ulcer Post Debridement: Requires Further Debridement Severity of Tissue Post Debridement: Fat layer exposed Post Procedure Diagnosis Same as Pre-procedure Electronic Signature(s) Signed: 10/15/2019 8:08:43 AM By: Linton Ham MD Signed: 10/15/2019 1:33:30 PM By: Kela Millin Entered By: Kela Millin on 10/12/2019 10:12:53 -------------------------------------------------------------------------------- HPI Details Patient Name: Date of Service: [name], [name] 10/12/2019 9:15 A M Medical Record Number: [id number] Patient Account Number: [id number] Date of Birth/Sex: Treating RN: 1960-02-18 (59 y.o. [name] Primary Care Provider: [name] Other Clinician: Referring Provider: Treating Provider/Extender: [name] in Treatment: 8 History of Present Illness HPI Description: 08/15/2019 upon  evaluation today patient presents for evaluation here in our clinic concerning issues that he is having with wounds on the dorsal surface of his right foot. This is something that was noted to occur after he was obtaining cortisone injections with [facility]. Subsequently he developed ulcerations he was then referred to Dr. Alvester Chou who performed arterial testing and subsequently found that the patient had poor arterial flow with an ABI of 0.6 and a TBI of 0.26. With that being said he has since on 07/16/2019 had an angiogram on the right with improvement of his ABI to 0.85 and TBI to 0.5. Obviously this is not perfect but is definitely far superior to where it was previous. He does have some necrotic tissue on the surface of the wounds is going to need debriding away he has been using Silvadene on this but to be honest that is not can to do anything for these wounds. He needs something more to clear this away sharp debridement is ideal and we may even look towards Santyl as well. The patient does have a history of diabetes noted in his chart though is not on medication and his A1c was around 6.4 so is not terrible. With that being said he does have known peripheral vascular disease he is actually have an angiogram on the left tomorrow. He also has hypertension and he does smoke. 8/27; this is a patient has been here once before about 6 weeks ago. Since then he was hospitalized from 7/22 through 7/23 by Dr. Gwenlyn Found for an attempt at revascularization. He had previously been seen by Dr. Amalia Hailey of podiatry. He is a continued tobacco smoker. His angiogram on 6/21 via revealed a occluded SFAs bilaterally with one-vessel runoff. He had a atherectomy followed by a drug-coated balloon angioplasty of the right SFA. 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 He had some improvement in his right ABI from 0.6-0.85. There is also an attempt to open his left SFA but that was unsuccessful.  He does not have a wound on the left side. We are using Santyl on the patient's wounds on the right dorsal foot. He says he has about a 1 minute exercise tolerance before stopping because of pain compatible with claudication 9/10; we are using Santyl on the wound on the right dorsal foot. Still has significant claudication symptoms. He is status post angioplasty of the right SFA and atherectomy. He has one-vessel runoff via a diseased peroneal. He follows up with Dr. Gwenlyn Found on 9/14 9/17; I had my notes from last week that the patient was to see Dr. Gwenlyn Found on 9/14 he tells me that the appointment is actually on 9/21 although I do not see that in epic. The next appointment I see with Dr. Gwenlyn Found is on 11/17. Nevertheless the patient is fairly adamant that his appointment is early next week I told him to call ahead before he goes. The wound is a lot better we have been using Santyl over that he just ran out of that all represcribe it. Electronic Signature(s) Signed: 10/15/2019 8:08:43 AM By: Linton Ham MD Entered By: Linton Ham on 10/12/2019 10:16:23 -------------------------------------------------------------------------------- Physical Exam Details Patient Name: Date of Service: [name] 10/12/2019 9:15 A M Medical Record Number: [id number] Patient Account Number: [id number] Date of Birth/Sex: Treating RN: 10/09/1960 (59 y.o. [name] Primary Care Provider: [name] Other Clinician: Referring Provider: Treating Provider/Extender: Domenic Polite Weeks in Treatment: 8 Constitutional Sitting or standing Blood Pressure is within target range for patient.. Pulse regular and within target range for patient.Marland Kitchen Respirations regular, non-labored and within target range.. Temperature is normal and within the target range for the patient.Marland Kitchen Appears in no distress. Cardiovascular Pedal pulses are not palpable in the right foot I had trouble feeling his  popliteal today as well. Notes Wound exam; dorsal foot. His major wound looks better better looking surface. Still some debris on the surface I did not debride this further today no evidence of surrounding infection Electronic Signature(s) Signed: 10/15/2019 8:08:43 AM By: Linton Ham MD Entered By: Linton Ham on 10/12/2019 10:17:23 -------------------------------------------------------------------------------- Physician Orders Details Patient Name: Date of Service: [name], [name] 10/12/2019 9:15 A M Medical Record Number: [id number] Patient Account Number: [id number] Date of Birth/Sex: Treating RN: [birth year] (59 y.o. [name] Primary Care Provider: [name] Other Clinician: Referring Provider: Treating Provider/Extender: [name] in Treatment: 8 Verbal / Phone Orders: No Diagnosis Coding ICD-10 Coding Code Description I73.89 Other specified peripheral vascular diseases E11.621 Type 2 diabetes mellitus with foot ulcer L97.512 Non-pressure chronic ulcer of other part of right foot with fat layer exposed I10 Essential (primary) hypertension F17.218 Nicotine dependence, cigarettes, with other nicotine-induced disorders Follow-up Appointments Return Appointment in 1 week. Dressing Change Frequency Wound #1 Right,Dorsal Foot Change dressing every day. Wound Cleansing Wound #1 Right,Dorsal Foot May shower and wash wound with soap and water. Primary Wound Dressing Wound #1 Right,Dorsal Foot Santyl Ointment Secondary Dressing Wound #1 Right,Dorsal Foot Kerlix/Rolled Gauze Dry Gauze Patient Medications llergies: No Known Drug Allergies A Notifications Medication Indication Start End 10/12/2019 Santyl DOSE topical 250 unit/gram ointment - ointment topical to wound change daily Electronic Signature(s) Signed: 10/12/2019 10:21:26 AM By: Linton Ham MD Entered By: Linton Ham on 10/12/2019  10:21:25 -------------------------------------------------------------------------------- Problem List Details Patient Name: Date of Service: [name] 10/12/2019 9:15 A M Medical Record Number: [id number] Patient Account Number: [id number] Date of Birth/Sex: Treating RN: Feb 27, 1960 (  59 y.o. [name] Primary Care Provider: [name] Other Clinician: Referring Provider: Treating Provider/Extender: Domenic Polite Weeks in Treatment: 8 Active Problems ICD-10 Encounter Code Description Active Date MDM Diagnosis I73.89 Other specified peripheral vascular diseases 08/15/2019 No Yes E11.621 Type 2 diabetes mellitus with foot ulcer 08/15/2019 No Yes L97.512 Non-pressure chronic ulcer of other part of right foot with fat layer exposed 08/15/2019 No Yes I10 Essential (primary) hypertension 08/15/2019 No Yes F17.218 Nicotine dependence, cigarettes, with other nicotine-induced disorders 08/15/2019 No Yes Inactive Problems Resolved Problems Electronic Signature(s) Signed: 10/15/2019 8:08:43 AM By: Linton Ham MD Entered By: Linton Ham on 10/12/2019 10:14:30 -------------------------------------------------------------------------------- Progress Note Details Patient Name: Date of Service: [name], [name] 10/12/2019 9:15 A M Medical Record Number: [id number] Patient Account Number: [id number] Date of Birth/Sex: Treating RN: May 11, 1960 (59 y.o. [name] Primary Care Provider: [name] Other Clinician: Referring Provider: Treating Provider/Extender: Domenic Polite Weeks in Treatment: 8 Subjective History of Present Illness (HPI) 08/15/2019 upon evaluation today patient presents for evaluation here in our clinic concerning issues that he is having with wounds on the dorsal surface of his right foot. This is something that was noted to occur after he was obtaining cortisone injections with [facility]. Subsequently he developed ulcerations he was then referred to Dr. Alvester Chou who performed arterial testing and subsequently found that the patient had poor arterial flow with an ABI of 0.6 and a TBI of 0.26. With that being said he has since on 07/16/2019 had an angiogram on the right with improvement of his ABI to 0.85 and TBI to 0.5. Obviously this is not perfect but is definitely far superior to where it was previous. He does have some necrotic tissue on the surface of the wounds is going to need debriding away he has been using Silvadene on this but to be honest that is not can to do anything for these wounds. He needs something more to clear this away sharp debridement is ideal and we may even look towards Santyl as well. The patient does have a history of diabetes noted in his chart though is not on medication and his A1c was around 6.4 so is not terrible. With that being said he does have known peripheral vascular disease he is actually have an angiogram on the left tomorrow. He also has hypertension and he does smoke. 8/27; this is a patient has been here once before about 6 weeks ago. Since then he was hospitalized from 7/22 through 7/23 by Dr. Gwenlyn Found for an attempt at revascularization. He had previously been seen by Dr. Amalia Hailey of podiatry. He is a continued tobacco smoker. His angiogram on 6/21 via revealed a occluded SFAs bilaterally with one-vessel runoff. He had a atherectomy followed by a drug-coated balloon angioplasty of the right SFA. 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 He had some improvement in his right ABI from 0.6-0.85. There is also an attempt to open his left SFA but that was unsuccessful. He does not have a wound on the left side. We are using Santyl on the patient's wounds on the right dorsal foot. He says he has about a 1 minute exercise tolerance before stopping because of pain compatible with claudication 9/10; we are using Santyl on  the wound on the right dorsal foot. Still has significant claudication symptoms. He is status post angioplasty of the right SFA and atherectomy. He has  one-vessel runoff via a diseased peroneal. He follows up with Dr. Gwenlyn Found on 9/14 9/17; I had my notes from last week that the patient was to see Dr. Gwenlyn Found on 9/14 he tells me that the appointment is actually on 9/21 although I do not see that in epic. The next appointment I see with Dr. Gwenlyn Found is on 11/17. Nevertheless the patient is fairly adamant that his appointment is early next week I told him to call ahead before he goes. The wound is a lot better we have been using Santyl over that he just ran out of that all represcribe it. Objective Constitutional Sitting or standing Blood Pressure is within target range for patient.. Pulse regular and within target range for patient.Marland Kitchen Respirations regular, non-labored and within target range.. Temperature is normal and within the target range for the patient.Marland Kitchen Appears in no distress. Vitals Time Taken: 9:36 AM, Height: 62 in, Weight: 130 lbs, BMI: 23.8, Temperature: 97.7 F, Pulse: 78 bpm, Respiratory Rate: 16 breaths/min, Blood Pressure: 127/72 mmHg. Cardiovascular Pedal pulses are not palpable in the right foot I had trouble feeling his popliteal today as well. General Notes: Wound exam; dorsal foot. His major wound looks better better looking surface. Still some debris on the surface I did not debride this further today no evidence of surrounding infection Integumentary (Hair, Skin) Wound #1 status is Open. Original cause of wound was Gradually Appeared. The wound is located on the Right,Dorsal Foot. The wound measures 1cm length x 1.3cm width x 0.2cm depth; 1.021cm^2 area and 0.204cm^3 volume. There is Fat Layer (Subcutaneous Tissue) exposed. There is no tunneling or undermining noted. There is a medium amount of serosanguineous drainage noted. The wound margin is flat and intact. There is large  (67-100%) red, pink granulation within the wound bed. There is a small (1-33%) amount of necrotic tissue within the wound bed including Adherent Slough. Assessment Active Problems ICD-10 Other specified peripheral vascular diseases Type 2 diabetes mellitus with foot ulcer Non-pressure chronic ulcer of other part of right foot with fat layer exposed Essential (primary) hypertension Nicotine dependence, cigarettes, with other nicotine-induced disorders Procedures Wound #1 Pre-procedure diagnosis of Wound #1 is a Diabetic Wound/Ulcer of the Lower Extremity located on the Right,Dorsal Foot .Severity of Tissue Pre Debridement is: Fat layer exposed. There was a Chemical/Enzymatic/Mechanical debridement performed by Ricard Dillon., MD.. Agent used was Santyl. There was no bleeding. The procedure was tolerated well. Post Debridement Measurements: 1cm length x 1.3cm width x 0.2cm depth; 0.204cm^3 volume. Character of Wound/Ulcer Post Debridement requires further debridement. Severity of Tissue Post Debridement is: Fat layer exposed. Post procedure Diagnosis Wound #1: Same as Pre-Procedure Plan Follow-up Appointments: Return Appointment in 1 week. Dressing Change Frequency: Wound #1 Right,Dorsal Foot: Change dressing every day. Wound Cleansing: Wound #1 Right,Dorsal Foot: May shower and wash wound with soap and water. Primary Wound Dressing: Wound #1 Right,Dorsal Foot: Santyl Ointment Secondary Dressing: Wound #1 Right,Dorsal Foot: Kerlix/Rolled Gauze Dry Gauze The following medication(s) was prescribed: Santyl topical 250 unit/gram ointment ointment topical to wound change daily starting 10/12/2019 1. Patient with ischemic wounds on the right dorsal foot 2. I have asked him to clarify when his follow-up appointment with Dr. Gwenlyn Found is. He had right SFA procedure. I believe he has one vessel runoff via the peroneal. I am uncertain about whether anything can be done further here. He still  has claudication symptoms even at night. 3. He told me he did not want mechanical debridement today, fortunately I do not  think he needs any. I have represcribed the Santyl. Follow-up next week Electronic Signature(s) Signed: 10/12/2019 10:21:44 AM By: Linton Ham MD Entered By: Linton Ham on 10/12/2019 10:21:44 -------------------------------------------------------------------------------- SuperBill Details Patient Name: Date of Service: [name], [name] 10/12/2019 Medical Record Number: [id number] Patient Account Number: [id number] Date of Birth/Sex: Treating RN: 1960/10/31 (59 y.o. [name] Primary Care Provider: [name] Other Clinician: Referring Provider: Treating Provider/Extender: Domenic Polite Weeks in Treatment: 8 Diagnosis Coding ICD-10 Codes Code Description I73.89 Other specified peripheral vascular diseases E11.621 Type 2 diabetes mellitus with foot ulcer L97.512 Non-pressure chronic ulcer of other part of right foot with fat layer exposed I10 Essential (primary) hypertension F17.218 Nicotine dependence, cigarettes, with other nicotine-induced disorders Facility Procedures CPT4 Code: [id number] Description: [phone] - DEBRIDE W/O ANES NON SELECT ICD-10 Diagnosis Description L97.512 Non-pressure chronic ulcer of other part of right foot with fat layer exposed Modifier: Quantity: 1 Electronic Signature(s) Signed: 10/15/2019 8:08:43 AM By: Linton Ham MD Signed: 10/15/2019 1:33:30 PM By: Kela Millin Entered By: Kela Millin on 10/12/2019 10:13:15

## 2019-10-16 NOTE — Progress Notes (Signed)
RONNY, KORFF ([id number]) Visit Report for 10/12/2019 Arrival Information Details Patient Name: Date of Service: [place] 10/12/2019 9:15 A M Medical Record Number: [id number] Patient Account Number: [id number] Date of Birth/Sex: Treating RN: [birth year] (59 y.o. [name] Primary Care Sedra Morfin: [name] Other Clinician: Referring Tamelia Michalowski: Treating Genese Quebedeaux/Extender: [name] in Treatment: 8 Visit Information History Since Last Visit Added or deleted any medications: No Patient Arrived: Ambulatory Any new allergies or adverse reactions: No Arrival Time: 09:34 Had a fall or experienced change in No Accompanied By: self activities of daily living that may affect Transfer Assistance: None risk of falls: Patient Identification Verified: Yes Signs or symptoms of abuse/neglect since last visito No Secondary Verification Process Completed: Yes Hospitalized since last visit: No Patient Requires Transmission-Based Precautions: No Implantable device outside of the clinic excluding No Patient Has Alerts: Yes cellular tissue based products placed in the center Patient Alerts: Patient on Blood Thinner since last visit: R ABI: 0.85 R TBI: 0.50 Has Dressing in Place as Prescribed: Yes Pain Present Now: Yes Electronic Signature(s) Signed: 10/15/2019 8:02:18 AM By: Sandre Kitty Entered By: Sandre Kitty on 10/12/2019 09:36:36 -------------------------------------------------------------------------------- Encounter Discharge Information Details Patient Name: Date of Service: [name], [name] 10/12/2019 9:15 A M Medical Record Number: [id number] Patient Account Number: [id number] Date of Birth/Sex: Treating RN: 08/11/1960 (59 y.o. [name] Primary Care Natsha Guidry: [name] Other Clinician: Referring Danila Eddie: Treating Akacia Boltz/Extender: [name] in Treatment: 8 Encounter  Discharge Information Items Post Procedure Vitals Discharge Condition: Stable Temperature (F): 97.7 Ambulatory Status: Ambulatory Pulse (bpm): 78 Discharge Destination: Home Respiratory Rate (breaths/min): 18 Transportation: Private Auto Blood Pressure (mmHg): 127/72 Accompanied By: self Schedule Follow-up Appointment: Yes Clinical Summary of Care: Patient Declined Electronic Signature(s) Signed: 10/16/2019 4:05:29 PM By: Baruch Gouty RN, BSN Entered By: Baruch Gouty on 10/12/2019 10:21:03 -------------------------------------------------------------------------------- Lower Extremity Assessment Details Patient Name: Date of Service: [name] 10/12/2019 9:15 A M Medical Record Number: [id number] Patient Account Number: [id number] Date of Birth/Sex: Treating RN: [birth year] (59 y.o. [name] Primary Care Nicholai Willette: [name] Other Clinician: Referring Mazey Mantell: Treating Sahily Biddle/Extender: Domenic Polite Weeks in Treatment: 8 Edema Assessment Assessed: Shirlyn Goltz: No] Patrice Paradise: Yes] Edema: [Left: N] [Right: o] Calf Left: Right: Point of Measurement: 28 cm From Medial Instep cm 28 cm Ankle Left: Right: Point of Measurement: 9 cm From Medial Instep cm 17 cm Vascular Assessment Pulses: Dorsalis Pedis Palpable: [Right:Yes] Electronic Signature(s) Signed: 10/12/2019 5:49:45 PM By: Deon Pilling Entered By: Deon Pilling on 10/12/2019 09:47:25 -------------------------------------------------------------------------------- Multi Wound Chart Details Patient Name: Date of Service: [name], [name] 10/12/2019 9:15 A M Medical Record Number: [id number] Patient Account Number: [id number] Date of Birth/Sex: Treating RN: Jun 01, 1960 (59 y.o. [name] Primary Care Yobani Schertzer: [name] Other Clinician: Referring Rishik Tubby: Treating Laconda Basich/Extender: Domenic Polite Weeks in Treatment: 8 Vital  Signs Height(in): 62 Pulse(bpm): 20 Weight(lbs): 130 Blood Pressure(mmHg): 127/72 Body Mass Index(BMI): 24 Temperature(F): 97.7 Respiratory Rate(breaths/min): 16 Photos: [1:No Photos Right, Dorsal Foot] [N/A:N/A N/A] Wound Location: [1:Gradually Appeared] [N/A:N/A] Wounding Event: [1:Diabetic Wound/Ulcer of the Lower] [N/A:N/A] Primary Etiology: [1:Extremity Arterial Insufficiency Ulcer] [N/A:N/A] Secondary Etiology: [1:Hypertension, Peripheral Arterial] [N/A:N/A] Comorbid History: [1:Disease, Type II Diabetes, Neuropathy 03/26/2019] [N/A:N/A] Date Acquired: [1:8] [N/A:N/A] Weeks of Treatment: [1:Open] [N/A:N/A] Wound Status: [1:1x1.3x0.2] [N/A:N/A] Measurements L x W x D (cm) [1:1.021] [N/A:N/A] A (cm) : rea [1:0.204] [  N/A:N/A] Volume (cm) : [1:63.90%] [N/A:N/A] % Reduction in A rea: [1:27.90%] [N/A:N/A] % Reduction in Volume: [1:Grade 2] [N/A:N/A] Classification: [1:Medium] [N/A:N/A] Exudate A mount: [1:Serosanguineous] [N/A:N/A] Exudate Type: [1:red, brown] [N/A:N/A] Exudate Color: [1:Flat and Intact] [N/A:N/A] Wound Margin: [1:Large (67-100%)] [N/A:N/A] Granulation A mount: [1:Red, Pink] [N/A:N/A] Granulation Quality: [1:Small (1-33%)] [N/A:N/A] Necrotic A mount: [1:Fat Layer (Subcutaneous Tissue): Yes N/A] Exposed Structures: [1:Fascia: No Tendon: No Muscle: No Joint: No Bone: No Small (1-33%)] [N/A:N/A] Epithelialization: [1:Chemical/Enzymatic/Mechanical] [N/A:N/A] Debridement: [1:N/A] [N/A:N/A] Instrument: [1:None] [N/A:N/A] Bleeding: Debridement Treatment Response: Procedure was tolerated well [N/A:N/A] Post Debridement Measurements L x 1x1.3x0.2 [N/A:N/A] W x D (cm) [1:0.204] [N/A:N/A] Post Debridement Volume: (cm) [1:Debridement] [N/A:N/A] Treatment Notes Electronic Signature(s) Signed: 10/15/2019 8:08:43 AM By: Linton Ham MD Signed: 10/15/2019 1:33:30 PM By: Kela Millin Entered By: Linton Ham on 10/12/2019  10:14:58 -------------------------------------------------------------------------------- Multi-Disciplinary Care Plan Details Patient Name: Date of Service: Hyndman RD, [name] 10/12/2019 9:15 A M Medical Record Number: [id number] Patient Account Number: [id number] Date of Birth/Sex: Treating RN: February 20, 1960 (59 y.o. [name] Primary Care Macyn Remmert: [name] Other Clinician: Referring Anusha Claus: Treating Glenette Bookwalter/Extender: [name] in Treatment: 8 Active Inactive Nutrition Nursing Diagnoses: Impaired glucose control: actual or potential Potential for alteratiion in Nutrition/Potential for imbalanced nutrition Goals: Patient/caregiver will maintain therapeutic glucose control Date Initiated: 08/15/2019 Target Resolution Date: 11/02/2019 Goal Status: Active Interventions: Assess HgA1c results as ordered upon admission and as needed Assess patient nutrition upon admission and as needed per policy Treatment Activities: Patient referred to Primary Care Physician for further nutritional evaluation : 08/15/2019 Notes: Tissue Oxygenation Nursing Diagnoses: Actual ineffective tissue perfusion; peripheral (select once diagnosis is confirmed) Knowledge deficit related to disease process and management Goals: Patient/caregiver will verbalize understanding of disease process and disease management Date Initiated: 08/15/2019 Target Resolution Date: 11/02/2019 Goal Status: Active Interventions: Assess patient understanding of disease process and management upon diagnosis and as needed Assess peripheral arterial status upon admission and as needed Provide education on tissue oxygenation and ischemia Treatment Activities: T ordered outside of clinic : 08/15/2019 est Notes: Wound/Skin Impairment Nursing Diagnoses: Impaired tissue integrity Knowledge deficit related to smoking impact on wound healing Knowledge deficit related to  ulceration/compromised skin integrity Goals: Patient will demonstrate a reduced rate of smoking or cessation of smoking Date Initiated: 08/15/2019 Target Resolution Date: 11/02/2019 Goal Status: Active Patient/caregiver will verbalize understanding of skin care regimen Date Initiated: 08/15/2019 Target Resolution Date: 11/02/2019 Goal Status: Active Ulcer/skin breakdown will have a volume reduction of 30% by week 4 Date Initiated: 08/15/2019 Date Inactivated: 09/21/2019 Target Resolution Date: 09/12/2019 Goal Status: Met Interventions: Assess patient/caregiver ability to obtain necessary supplies Assess patient/caregiver ability to perform ulcer/skin care regimen upon admission and as needed Assess ulceration(s) every visit Provide education on ulcer and skin care Treatment Activities: Skin care regimen initiated : 08/15/2019 Topical wound management initiated : 08/15/2019 Notes: Electronic Signature(s) Signed: 10/15/2019 1:33:30 PM By: Kela Millin Entered By: Kela Millin on 10/12/2019 09:32:00 -------------------------------------------------------------------------------- Pain Assessment Details Patient Name: Date of Service: [name] 10/12/2019 9:15 A M Medical Record Number: [id number] Patient Account Number: [id number] Date of Birth/Sex: Treating RN: 11/07/1960 (59 y.o. [name] Primary Care Corie Vavra: [name] Other Clinician: Referring Erik Burkett: Treating Yuriy Cui/Extender: [name] in Treatment: 8 Active Problems Location of Pain Severity and Description of Pain Patient Has Paino Yes Site Locations Rate the pain. Current Pain Level: 4 Pain Management and Medication Current Pain Management: Electronic  Signature(s) Signed: 10/15/2019 8:02:18 AM By: Sandre Kitty Signed: 10/15/2019 1:33:30 PM By: Kela Millin Entered By: Sandre Kitty on 10/12/2019  09:37:07 -------------------------------------------------------------------------------- Patient/Caregiver Education Details Patient Name: Date of Service: [name] 9/17/2021andnbsp9:15 A M Medical Record Number: [id number] Patient Account Number: [id number] Date of Birth/Gender: Treating RN: August 14, 1960 (59 y.o. [name] Primary Care Physician: [name] Other Clinician: Referring Physician: Treating Physician/Extender: [name] in Treatment: 8 Education Assessment Education Provided To: Patient Education Topics Provided Wound/Skin Impairment: Handouts: Caring for Your Ulcer Methods: Explain/Verbal Responses: State content correctly Electronic Signature(s) Signed: 10/15/2019 1:33:30 PM By: Kela Millin Entered By: Kela Millin on 10/12/2019 09:32:16 -------------------------------------------------------------------------------- Wound Assessment Details Patient Name: Date of Service: [name] 10/12/2019 9:15 A M Medical Record Number: [id number] Patient Account Number: [id number] Date of Birth/Sex: Treating RN: [birth year] (59 y.o. [name] Primary Care Tasheena Wambolt: [name] Other Clinician: Referring Megyn Leng: Treating Onyinyechi Huante/Extender: Domenic Polite Weeks in Treatment: 8 Wound Status Wound Number: 1 Primary Diabetic Wound/Ulcer of the Lower Extremity Etiology: Wound Location: Right, Dorsal Foot Secondary Arterial Insufficiency Ulcer Wounding Event: Gradually Appeared Etiology: Date Acquired: 03/26/2019 Wound Status: Open Weeks Of Treatment: 8 Comorbid Hypertension, Peripheral Arterial Disease, Type II Diabetes, Clustered Wound: No History: Neuropathy Photos Photo Uploaded By: Mikeal Hawthorne on 10/16/2019 11:32:14 Wound Measurements Length: (cm) 1 Width: (cm) 1.3 Depth: (cm) 0.2 Area: (cm) 1.021 Volume: (cm) 0.204 % Reduction in Area: 63.9% %  Reduction in Volume: 27.9% Epithelialization: Small (1-33%) Tunneling: No Undermining: No Wound Description Classification: Grade 2 Wound Margin: Flat and Intact Exudate Amount: Medium Exudate Type: Serosanguineous Exudate Color: red, brown Foul Odor After Cleansing: No Slough/Fibrino Yes Wound Bed Granulation Amount: Large (67-100%) Exposed Structure Granulation Quality: Red, Pink Fascia Exposed: No Necrotic Amount: Small (1-33%) Fat Layer (Subcutaneous Tissue) Exposed: Yes Necrotic Quality: Adherent Slough Tendon Exposed: No Muscle Exposed: No Joint Exposed: No Bone Exposed: No Treatment Notes Wound #1 (Right, Dorsal Foot) 2. Periwound Care Skin Prep 3. Primary Dressing Applied Santyl 4. Secondary Dressing Foam Border Dressing Electronic Signature(s) Signed: 10/12/2019 5:49:45 PM By: Deon Pilling Signed: 10/15/2019 1:33:30 PM By: Kela Millin Entered By: Deon Pilling on 10/12/2019 09:47:45 -------------------------------------------------------------------------------- Vitals Details Patient Name: Date of Service: [name], [name] 10/12/2019 9:15 A M Medical Record Number: [id number] Patient Account Number: [id number] Date of Birth/Sex: Treating RN: [birth year] (59 y.o. [name] Primary Care Dason Mosley: [name] Other Clinician: Referring Nijee Heatwole: Treating Roy Snuffer/Extender: [name] in Treatment: 8 Vital Signs Time Taken: 09:36 Temperature (F): 97.7 Height (in): 62 Pulse (bpm): 78 Weight (lbs): 130 Respiratory Rate (breaths/min): 16 Body Mass Index (BMI): 23.8 Blood Pressure (mmHg): 127/72 Reference Range: 80 - 120 mg / dl Electronic Signature(s) Signed: 10/15/2019 8:02:18 AM By: Sandre Kitty Entered By: Sandre Kitty on 10/12/2019 09:36:56

## 2019-10-22 ENCOUNTER — Other Ambulatory Visit: Payer: Self-pay

## 2019-10-22 ENCOUNTER — Ambulatory Visit (INDEPENDENT_AMBULATORY_CARE_PROVIDER_SITE_OTHER): Payer: Medicare Other | Admitting: Podiatry

## 2019-10-22 DIAGNOSIS — E0843 Diabetes mellitus due to underlying condition with diabetic autonomic (poly)neuropathy: Secondary | ICD-10-CM

## 2019-10-22 DIAGNOSIS — I998 Other disorder of circulatory system: Secondary | ICD-10-CM

## 2019-10-22 DIAGNOSIS — I70229 Atherosclerosis of native arteries of extremities with rest pain, unspecified extremity: Secondary | ICD-10-CM

## 2019-10-22 DIAGNOSIS — L97512 Non-pressure chronic ulcer of other part of right foot with fat layer exposed: Secondary | ICD-10-CM | POA: Diagnosis not present

## 2019-10-22 NOTE — Progress Notes (Signed)
   HPI: 59 y.o. male presenting today for follow-up evaluation of right lower extremity pain and an ulcer to the dorsal aspect of the right foot that is progressed since last visit.  He was last seen here in the office on 05/28/2019.  Currently he is being managed by cardiovascular, Dr. Quay Burow, and wound care at the [facility] wound care center.  Patient states that his wound care center mentioned that his ulcer is strictly of a vascular etiology and recommended follow-up with his vascular doctor.  Patient is an active smoker for several years.  He presented today for further treatment and evaluation of the wound.  Past Medical History:  Diagnosis Date  . Diabetes mellitus without complication ([place])   . Hyperlipidemia   . Hypertension   . Peripheral vascular disease ([place])   . Tobacco use      Physical Exam: General: The patient is alert and oriented x3 in no acute distress.  Dermatology: Skin is warm, dry and supple bilateral lower extremities. Negative for open lesions or macerations.  Wound noted to the dorsal aspect of the right foot which has progressed since last visit.  Wound measures approximately 2.5 cm in diameter x0.2 cm in depth.  Please see attached picture.  There does not appear to be any exposed tendon muscle ligament or joint.  No exposed bone.  No malodor noted.  Minimal serosanguineous drainage noted.  Wound base is fibrotic with granular tissue around the borders of the wound.  Vascular: NonPalpable pedal pulses right lower extremity today. No edema or erythema noted.    Neurological: Epicritic and protective threshold diminished bilaterally.   Musculoskeletal Exam: Range of motion within normal limits to all pedal and ankle joints bilateral. Muscle strength 5/5 in all groups bilateral.   Assessment: 1.  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  Diabetes mellitus with peripheral polyneuropathy 3.  Right lower extremity limb  ischemia   Plan of Care:  1. Patient evaluated.  2.  Today I simply recommended and stressed the importance of the patient follows up with cardiovascular.  Explained to the patient this is strictly a vascular issue and wound care will be limited unless he is able to get adequate flow to the wound. 3.  In the meantime continue management of the wound at the [facility] wound care center 4.  Patient understands and is going to make an appointment follow-up with cardiovascular as soon as possible 5.  Return to clinic as needed     Edrick Kins, DPM [facility]  Dr. Edrick Kins, DPM    [address], [place] [address]                Office [phone]  Fax [phone]

## 2019-10-26 ENCOUNTER — Other Ambulatory Visit: Payer: Self-pay

## 2019-10-26 ENCOUNTER — Encounter (HOSPITAL_BASED_OUTPATIENT_CLINIC_OR_DEPARTMENT_OTHER): Payer: Medicare Other | Attending: Internal Medicine | Admitting: Internal Medicine

## 2019-10-26 DIAGNOSIS — I10 Essential (primary) hypertension: Secondary | ICD-10-CM | POA: Diagnosis not present

## 2019-10-26 DIAGNOSIS — L97512 Non-pressure chronic ulcer of other part of right foot with fat layer exposed: Secondary | ICD-10-CM | POA: Diagnosis not present

## 2019-10-26 DIAGNOSIS — E11621 Type 2 diabetes mellitus with foot ulcer: Secondary | ICD-10-CM | POA: Insufficient documentation

## 2019-10-26 DIAGNOSIS — F17218 Nicotine dependence, cigarettes, with other nicotine-induced disorders: Secondary | ICD-10-CM | POA: Diagnosis not present

## 2019-10-26 NOTE — Progress Notes (Signed)
JANZIEL, HOCKETT ([id number]) Visit Report for 10/26/2019 Debridement Details Patient Name: Date of Service: [name] 10/26/2019 9:15 A M Medical Record Number: [id number] Patient Account Number: [id number] Date of Birth/Sex: Treating RN: 10-Dec-1960 (59 y.o. [name] Primary Care Provider: [name] Other Clinician: Referring Provider: Treating Provider/Extender: Domenic Polite Weeks in Treatment: 10 Debridement Performed for Assessment: Wound #1 Right,Dorsal Foot Performed By: Physician Ricard Dillon., MD Debridement Type: Chemical/Enzymatic/Mechanical Agent Used: Santyl Severity of Tissue Pre Debridement: Fat layer exposed Level of Consciousness (Pre-procedure): Awake and Alert Pre-procedure Verification/Time Out No Taken: Bleeding: None Response to Treatment: Procedure was tolerated well Level of Consciousness (Post- Awake and Alert procedure): Post Debridement Measurements of Total Wound Length: (cm) 2.1 Width: (cm) 2.2 Depth: (cm) 0.1 Volume: (cm) 0.363 Character of Wound/Ulcer Post Debridement: Requires Further Debridement Severity of Tissue Post Debridement: Fat layer exposed Post Procedure Diagnosis Same as Pre-procedure Electronic Signature(s) Signed: 10/26/2019 4:10:39 PM By: Kela Millin Signed: 10/26/2019 4:24:19 PM By: Linton Ham MD Entered By: Kela Millin on 10/26/2019 09:28:33 -------------------------------------------------------------------------------- HPI Details Patient Name: Date of Service: [name] RD, [name] 10/26/2019 9:15 A M Medical Record Number: [id number] Patient Account Number: [id number] Date of Birth/Sex: Treating RN: November 25, 1960 (59 y.o. [name] Primary Care Provider: [name] Other Clinician: Referring Provider: Treating Provider/Extender: [name] in Treatment: 10 History of Present Illness HPI Description: 08/15/2019  upon evaluation today patient presents for evaluation here in our clinic concerning issues that he is having with wounds on the dorsal surface of his right foot. This is something that was noted to occur after he was obtaining cortisone injections with [facility]. Subsequently he developed ulcerations he was then referred to Dr. Alvester Chou who performed arterial testing and subsequently found that the patient had poor arterial flow with an ABI of 0.6 and a TBI of 0.26. With that being said he has since on 07/16/2019 had an angiogram on the right with improvement of his ABI to 0.85 and TBI to 0.5. Obviously this is not perfect but is definitely far superior to where it was previous. He does have some necrotic tissue on the surface of the wounds is going to need debriding away he has been using Silvadene on this but to be honest that is not can to do anything for these wounds. He needs something more to clear this away sharp debridement is ideal and we may even look towards Santyl as well. The patient does have a history of diabetes noted in his chart though is not on medication and his A1c was around 6.4 so is not terrible. With that being said he does have known peripheral vascular disease he is actually have an angiogram on the left tomorrow. He also has hypertension and he does smoke. 8/27; this is a patient has been here once before about 6 weeks ago. Since then he was hospitalized from 7/22 through 7/23 by Dr. Gwenlyn Found for an attempt at revascularization. He had previously been seen by Dr. Amalia Hailey of podiatry. He is a continued tobacco smoker. His angiogram on 6/21 via revealed a occluded SFAs bilaterally with one-vessel runoff. He had a atherectomy followed by a drug-coated balloon angioplasty of the right SFA. 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 He had some improvement in his right ABI from 0.6-0.85. There is also an attempt to open his left SFA but that was  unsuccessful. He does not have a wound on the left side. We are using Santyl on the patient's wounds on the right dorsal foot. He says he has about a 1 minute exercise tolerance before stopping because of pain compatible with claudication 9/10; we are using Santyl on the wound on the right dorsal foot. Still has significant claudication symptoms. He is status post angioplasty of the right SFA and atherectomy. He has one-vessel runoff via a diseased peroneal. He follows up with Dr. Gwenlyn Found on 9/14 9/17; I had my notes from last week that the patient was to see Dr. Gwenlyn Found on 9/14 he tells me that the appointment is actually on 9/21 although I do not see that in epic. The next appointment I see with Dr. Gwenlyn Found is on 11/17. Nevertheless the patient is fairly adamant that his appointment is early next week I told him to call ahead before he goes. The wound is a lot better we have been using Santyl over that he just ran out of that all represcribe it. 10/1; patient arrives today with nothing on his wound. He says he has been using the Santyl but he feels the border foam is too painful. We told him he needs to put a covering on this of some sort perhaps gauze. Indeed after our discussion last time his appointment with Dr. Gwenlyn Found 11/17. Previously he has had a atherectomy and an angioplasty of the right SFA. I note in my previous notes it was said he had bilateral one-vessel runoff all need to review the tibial vessels on the angiogram. The patient is currently rating his pain at 8 out of 10 He followed up with Dr. Amalia Hailey at [facility] on 9/27. He emphasized to follow-up with vascular and osseous. Follow-up with them as needed. Electronic Signature(s) Signed: 10/26/2019 4:24:19 PM By: Linton Ham MD Entered By: Linton Ham on 10/26/2019 09:41:17 -------------------------------------------------------------------------------- Physical Exam Details Patient Name: Date of Service: [name]. 10/26/2019 9:15 A M Medical Record Number: [id number] Patient Account Number: [id number] Date of Birth/Sex: Treating RN: October 11, 1960 (59 y.o. [name] Primary Care Provider: [name] Other Clinician: Referring Provider: Treating Provider/Extender: Domenic Polite Weeks in Treatment: 10 Constitutional Patient is hypertensive.. Pulse regular and within target range for patient.Marland Kitchen Respirations regular, non-labored and within target range.. Temperature is normal and within the target range for the patient.Marland Kitchen Appears in no distress. Cardiovascular Both dorsalis pedis and posterior tibial pulses are absent on the right. I could not feel a popliteal pulse.. Notes Wound exam; dorsal foot. This is larger very painful. He has satellite lesions laterally and proximally. Neither 1 of these has a particularly viable surface there is debris on the surface of the wound. I did not attempt further debridements. Electronic Signature(s) Signed: 10/26/2019 4:24:19 PM By: Linton Ham MD Entered By: Linton Ham on 10/26/2019 09:42:26 -------------------------------------------------------------------------------- Physician Orders Details Patient Name: Date of Service: [name], [name]. 10/26/2019 9:15 A M Medical Record Number: [id number] Patient Account Number: [id number] Date of Birth/Sex: Treating RN: [birth year] (59 y.o. [name] Primary Care Provider: [name] Other Clinician: Referring Provider: Treating Provider/Extender: [name] in Treatment: 10 Verbal / Phone Orders: No Diagnosis Coding ICD-10 Coding Code Description I73.89 Other specified peripheral vascular diseases E11.621 Type 2 diabetes mellitus with foot ulcer L97.512 Non-pressure chronic ulcer of other part of right foot with fat layer exposed I10 Essential (primary) hypertension F17.218 Nicotine dependence, cigarettes,  with other  nicotine-induced disorders Follow-up Appointments Return Appointment in 2 weeks. Dressing Change Frequency Wound #1 Right,Dorsal Foot Change dressing every day. Wound Cleansing Wound #1 Right,Dorsal Foot May shower and wash wound with soap and water. Primary Wound Dressing Wound #1 Right,Dorsal Foot Santyl Ointment Secondary Dressing Wound #1 Right,Dorsal Foot Kerlix/Rolled Gauze Dry Gauze Off-Loading Open toe surgical shoe to: Electronic Signature(s) Signed: 10/26/2019 4:10:39 PM By: Kela Millin Signed: 10/26/2019 4:24:19 PM By: Linton Ham MD Entered By: Kela Millin on 10/26/2019 09:30:59 -------------------------------------------------------------------------------- Problem List Details Patient Name: Date of Service: [name], [name]. 10/26/2019 9:15 A M Medical Record Number: [id number] Patient Account Number: [id number] Date of Birth/Sex: Treating RN: [birth year] (59 y.o. [name] Primary Care Provider: [name] Other Clinician: Referring Provider: Treating Provider/Extender: Domenic Polite Weeks in Treatment: 10 Active Problems ICD-10 Encounter Code Description Active Date MDM Diagnosis I73.89 Other specified peripheral vascular diseases 08/15/2019 No Yes E11.621 Type 2 diabetes mellitus with foot ulcer 08/15/2019 No Yes L97.512 Non-pressure chronic ulcer of other part of right foot with fat layer exposed 08/15/2019 No Yes I10 Essential (primary) hypertension 08/15/2019 No Yes F17.218 Nicotine dependence, cigarettes, with other nicotine-induced disorders 08/15/2019 No Yes Inactive Problems Resolved Problems Electronic Signature(s) Signed: 10/26/2019 4:24:19 PM By: Linton Ham MD Entered By: Linton Ham on 10/26/2019 09:36:42 -------------------------------------------------------------------------------- Progress Note Details Patient Name: Date of Service: Ruben Leavell J. 10/26/2019 9:15 A  M Medical Record Number: [id number] Patient Account Number: [id number] Date of Birth/Sex: Treating RN: 04/15/1960 (59 y.o. [name] Primary Care Provider: [name] Other Clinician: Referring Provider: Treating Provider/Extender: Domenic Polite Weeks in Treatment: 10 Subjective History of Present Illness (HPI) 08/15/2019 upon evaluation today patient presents for evaluation here in our clinic concerning issues that he is having with wounds on the dorsal surface of his right foot. This is something that was noted to occur after he was obtaining cortisone injections with [facility]. Subsequently he developed ulcerations he was then referred to Dr. Alvester Chou who performed arterial testing and subsequently found that the patient had poor arterial flow with an ABI of 0.6 and a TBI of 0.26. With that being said he has since on 07/16/2019 had an angiogram on the right with improvement of his ABI to 0.85 and TBI to 0.5. Obviously this is not perfect but is definitely far superior to where it was previous. He does have some necrotic tissue on the surface of the wounds is going to need debriding away he has been using Silvadene on this but to be honest that is not can to do anything for these wounds. He needs something more to clear this away sharp debridement is ideal and we may even look towards Santyl as well. The patient does have a history of diabetes noted in his chart though is not on medication and his A1c was around 6.4 so is not terrible. With that being said he does have known peripheral vascular disease he is actually have an angiogram on the left tomorrow. He also has hypertension and he does smoke. 8/27; this is a patient has been here once before about 6 weeks ago. Since then he was hospitalized from 7/22 through 7/23 by Dr. Gwenlyn Found for an attempt at revascularization. He had previously been seen by Dr. Amalia Hailey of podiatry. He is a continued tobacco smoker. His  angiogram on 6/21 via revealed a occluded SFAs bilaterally with one-vessel runoff. He had a atherectomy followed  by a drug-coated balloon angioplasty of the right SFA. 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 He had some improvement in his right ABI from 0.6-0.85. There is also an attempt to open his left SFA but that was unsuccessful. He does not have a wound on the left side. We are using Santyl on the patient's wounds on the right dorsal foot. He says he has about a 1 minute exercise tolerance before stopping because of pain compatible with claudication 9/10; we are using Santyl on the wound on the right dorsal foot. Still has significant claudication symptoms. He is status post angioplasty of the right SFA and atherectomy. He has one-vessel runoff via a diseased peroneal. He follows up with Dr. Gwenlyn Found on 9/14 9/17; I had my notes from last week that the patient was to see Dr. Gwenlyn Found on 9/14 he tells me that the appointment is actually on 9/21 although I do not see that in epic. The next appointment I see with Dr. Gwenlyn Found is on 11/17. Nevertheless the patient is fairly adamant that his appointment is early next week I told him to call ahead before he goes. The wound is a lot better we have been using Santyl over that he just ran out of that all represcribe it. 10/1; patient arrives today with nothing on his wound. He says he has been using the Santyl but he feels the border foam is too painful. We told him he needs to put a covering on this of some sort perhaps gauze. Indeed after our discussion last time his appointment with Dr. Gwenlyn Found 11/17. Previously he has had a atherectomy and an angioplasty of the right SFA. I note in my previous notes it was said he had bilateral one-vessel runoff all need to review the tibial vessels on the angiogram. The patient is currently rating his pain at 8 out of 10 He followed up with Dr. Amalia Hailey at [facility] on 9/27. He emphasized  to follow-up with vascular and osseous. Follow-up with them as needed. Objective Constitutional Patient is hypertensive.. Pulse regular and within target range for patient.Marland Kitchen Respirations regular, non-labored and within target range.. Temperature is normal and within the target range for the patient.Marland Kitchen Appears in no distress. Vitals Time Taken: 9:15 AM, Height: 62 in, Weight: 130 lbs, BMI: 23.8, Temperature: 98.7 F, Pulse: 90 bpm, Respiratory Rate: 19 breaths/min, Blood Pressure: 147/78 mmHg. Cardiovascular Both dorsalis pedis and posterior tibial pulses are absent on the right. I could not feel a popliteal pulse.. General Notes: Wound exam; dorsal foot. This is larger very painful. He has satellite lesions laterally and proximally. Neither 1 of these has a particularly viable surface there is debris on the surface of the wound. I did not attempt further debridements. Integumentary (Hair, Skin) Wound #1 status is Open. Original cause of wound was Gradually Appeared. The wound is located on the Right,Dorsal Foot. The wound measures 2.1cm length x 2.2cm width x 0.1cm depth; 3.629cm^2 area and 0.363cm^3 volume. There is Fat Layer (Subcutaneous Tissue) exposed. There is no tunneling or undermining noted. There is a medium amount of serosanguineous drainage noted. The wound margin is flat and intact. There is medium (34-66%) red, pink granulation within the wound bed. There is a medium (34-66%) amount of necrotic tissue within the wound bed including Adherent Slough. Assessment Active Problems ICD-10 Other specified peripheral vascular diseases Type 2 diabetes mellitus with foot ulcer Non-pressure chronic ulcer of other part of right foot with  fat layer exposed Essential (primary) hypertension Nicotine dependence, cigarettes, with other nicotine-induced disorders Procedures Wound #1 Pre-procedure diagnosis of Wound #1 is a Diabetic Wound/Ulcer of the Lower Extremity located on the Right,Dorsal  Foot .Severity of Tissue Pre Debridement is: Fat layer exposed. There was a Chemical/Enzymatic/Mechanical debridement performed by Ricard Dillon., MD.. Agent used was Santyl. There was no bleeding. The procedure was tolerated well. Post Debridement Measurements: 2.1cm length x 2.2cm width x 0.1cm depth; 0.363cm^3 volume. Character of Wound/Ulcer Post Debridement requires further debridement. Severity of Tissue Post Debridement is: Fat layer exposed. Post procedure Diagnosis Wound #1: Same as Pre-Procedure Plan Follow-up Appointments: Return Appointment in 2 weeks. Dressing Change Frequency: Wound #1 Right,Dorsal Foot: Change dressing every day. Wound Cleansing: Wound #1 Right,Dorsal Foot: May shower and wash wound with soap and water. Primary Wound Dressing: Wound #1 Right,Dorsal Foot: Santyl Ointment Secondary Dressing: Wound #1 Right,Dorsal Foot: Kerlix/Rolled Gauze Dry Gauze Off-Loading: Open toe surgical shoe to: 1. Continue with Santyl. We emphasized the need to keep this wrappedo Gauze. The patient is very adamant about what he will and will do. He is still smoking. 2. Indeed after the discussion with him last week his next appointment with Dr. Gwenlyn Found is not until 11/17. I will review his angiogram as it applies to the tibial vessels on the right and get back with Dr. Gwenlyn Found to see what can be done. The patient is still 8 out of 10 pain. Claudication with minimal or no activity Electronic Signature(s) Signed: 10/26/2019 4:24:19 PM By: Linton Ham MD Entered By: Linton Ham on 10/26/2019 09:43:29 -------------------------------------------------------------------------------- SuperBill Details Patient Name: Date of Service: [name], [name]. 10/26/2019 Medical Record Number: [id number] Patient Account Number: [id number] Date of Birth/Sex: Treating RN: 1960-05-01 (59 y.o. [name] Primary Care Provider: [name] Other Clinician: Referring  Provider: Treating Provider/Extender: Domenic Polite Weeks in Treatment: 10 Diagnosis Coding ICD-10 Codes Code Description I73.89 Other specified peripheral vascular diseases E11.621 Type 2 diabetes mellitus with foot ulcer L97.512 Non-pressure chronic ulcer of other part of right foot with fat layer exposed I10 Essential (primary) hypertension F17.218 Nicotine dependence, cigarettes, with other nicotine-induced disorders Facility Procedures CPT4 Code: [id number] Description: [phone] - DEBRIDE W/O ANES NON SELECT Modifier: Quantity: 1 Physician Procedures : CPT4 Code Description Modifier [id number] - WC PHYS LEVEL 3 - EST PT ICD-10 Diagnosis Description I73.89 Other specified peripheral vascular diseases E11.621 Type 2 diabetes mellitus with foot ulcer L97.512 Non-pressure chronic ulcer of other part  of right foot with fat layer exposed Quantity: 1 Electronic Signature(s) Signed: 10/26/2019 4:24:19 PM By: Linton Ham MD Entered By: Linton Ham on 10/26/2019 09:43:50

## 2019-10-26 NOTE — Progress Notes (Signed)
GRIGOR, LIPSCHUTZ ([id number]) Visit Report for 10/26/2019 Arrival Information Details Patient Name: Date of Service: [name] 10/26/2019 9:15 A M Medical Record Number: [id number] Patient Account Number: [id number] Date of Birth/Sex: Treating RN: [birth year] (59 y.o. [name] Primary Care Provider: [name] Other Clinician: Referring Provider: Treating Provider/Extender: [name] in Treatment: 10 Visit Information History Since Last Visit Added or deleted any medications: No Patient Arrived: Ambulatory Any new allergies or adverse reactions: No Arrival Time: 09:14 Had a fall or experienced change in No Accompanied By: self activities of daily living that may affect Transfer Assistance: None risk of falls: Patient Identification Verified: Yes Signs or symptoms of abuse/neglect since last visito No Secondary Verification Process Completed: Yes Hospitalized since last visit: No Patient Requires Transmission-Based Precautions: No Implantable device outside of the clinic excluding No Patient Has Alerts: Yes cellular tissue based products placed in the center Patient Alerts: Patient on Blood Thinner since last visit: R ABI: 0.85 R TBI: 0.50 Has Dressing in Place as Prescribed: No Pain Present Now: Yes Electronic Signature(s) Signed: 10/26/2019 4:10:39 PM By: Kela Millin Entered By: Kela Millin on 10/26/2019 09:24:32 -------------------------------------------------------------------------------- Encounter Discharge Information Details Patient Name: Date of Service: Ruben Leavell J. 10/26/2019 9:15 A M Medical Record Number: [id number] Patient Account Number: [id number] Date of Birth/Sex: Treating RN: Dec 30, 1960 (59 y.o. [name] Primary Care Provider: [name] Other Clinician: Referring Provider: Treating Provider/Extender: [name] in Treatment:  10 Encounter Discharge Information Items Post Procedure Vitals Discharge Condition: Stable Temperature (F): 98.7 Ambulatory Status: Ambulatory Pulse (bpm): 90 Discharge Destination: Home Respiratory Rate (breaths/min): 19 Transportation: Private Auto Blood Pressure (mmHg): 147/78 Accompanied By: self Schedule Follow-up Appointment: Yes Clinical Summary of Care: Patient Declined Electronic Signature(s) Signed: 10/26/2019 4:10:39 PM By: Kela Millin Entered By: Kela Millin on 10/26/2019 09:37:58 -------------------------------------------------------------------------------- Lower Extremity Assessment Details Patient Name: Date of Service: [name]. 10/26/2019 9:15 A M Medical Record Number: [id number] Patient Account Number: [id number] Date of Birth/Sex: Treating RN: 10-28-1960 (59 y.o. [name] Primary Care Provider: [name] Other Clinician: Referring Provider: Treating Provider/Extender: Domenic Polite Weeks in Treatment: 10 Edema Assessment Assessed: Shirlyn Goltz: No] Patrice Paradise: No] Edema: [Left: N] [Right: o] Calf Left: Right: Point of Measurement: 28 cm From Medial Instep 28 cm Ankle Left: Right: Point of Measurement: 9 cm From Medial Instep 17 cm Vascular Assessment Pulses: Dorsalis Pedis Palpable: [Right:No] Electronic Signature(s) Signed: 10/26/2019 4:10:39 PM By: Kela Millin Entered By: Kela Millin on 10/26/2019 09:17:48 -------------------------------------------------------------------------------- Multi Wound Chart Details Patient Name: Date of Service: Ruben Leavell J. 10/26/2019 9:15 A M Medical Record Number: [id number] Patient Account Number: [id number] Date of Birth/Sex: Treating RN: March 24, 1960 (59 y.o. 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] [N/A:N/A] Secondary Etiology: [1:Hypertension, Peripheral Arterial] [N/A:N/A] Comorbid History: [1:Disease, Type II Diabetes, Neuropathy 03/26/2019] [N/A:N/A] Date Acquired: [1:10] [N/A:N/A] Weeks of Treatment: [1:Open] [N/A:N/A] Wound Status: [1:2.1x2.2x0.1] [N/A:N/A] Measurements L x W x D (cm) [1:3.629] [N/A:N/A] A (cm) : rea [1:0.363] [N/A:N/A] Volume (cm) : [  1:-28.40%] [N/A:N/A] % Reduction in A rea: [1:-28.30%] [N/A:N/A] % Reduction in Volume: [1:Grade 2] [N/A:N/A] Classification: [1:Medium] [N/A:N/A] Exudate A mount: [1:Serosanguineous] [N/A:N/A] Exudate Type: [1:red, brown] [N/A:N/A] Exudate Color: [1:Flat and Intact] [N/A:N/A] Wound Margin: [1:Medium (34-66%)] [N/A:N/A] Granulation A mount: [1:Red, Pink] [N/A:N/A] Granulation Quality: [1:Medium (34-66%)] [N/A:N/A] Necrotic A mount: [1:Fat Layer (Subcutaneous Tissue): Yes N/A] Exposed Structures: [1:Fascia: No Tendon: No Muscle: No Joint: No Bone: No Small (1-33%)] [N/A:N/A] Epithelialization: [1:Chemical/Enzymatic/Mechanical] [N/A:N/A] Debridement: [1:N/A] [N/A:N/A] Instrument: [1:None] [N/A:N/A] Bleeding: Debridement Treatment Response: Procedure was tolerated well [N/A:N/A] Post Debridement Measurements L x 2.1x2.2x0.1 [N/A:N/A] W x D (cm) [1:0.363] [N/A:N/A] Post Debridement Volume: (cm) [1:Debridement] [N/A:N/A] Treatment Notes Wound #1 (Right, Dorsal Foot) 1. Cleanse With Wound Cleanser 2. Periwound Care Skin Prep 3. Primary Dressing Applied Santyl 4. Secondary Dressing Dry Gauze Roll Gauze 5. Secured  With Tape 7. Footwear/Offloading device applied Surgical shoe Electronic Signature(s) Signed: 10/26/2019 4:10:39 PM By: Kela Millin Signed: 10/26/2019 4:24:19 PM By: Linton Ham MD Entered By: Linton Ham on 10/26/2019 09:38:09 -------------------------------------------------------------------------------- Multi-Disciplinary Care Plan Details Patient Name: Date of Service: [name], Ruben Kalata J. 10/26/2019 9:15 A M Medical Record Number: [id number] Patient Account Number: [id number] Date of Birth/Sex: Treating RN: 12-27-1960 (59 y.o. [name] Primary Care Provider: [name] Other Clinician: Referring Provider: Treating Provider/Extender: [name] in Treatment: 10 Active Inactive Nutrition Nursing Diagnoses: Impaired glucose control: actual or potential Potential for alteratiion in Nutrition/Potential for imbalanced nutrition Goals: Patient/caregiver will maintain therapeutic glucose control Date Initiated: 08/15/2019 Target Resolution Date: 11/02/2019 Goal Status: Active Interventions: Assess HgA1c results as ordered upon admission and as needed Assess patient nutrition upon admission and as needed per policy Treatment Activities: Patient referred to Primary Care Physician for further nutritional evaluation : 08/15/2019 Notes: Tissue Oxygenation Nursing Diagnoses: Actual ineffective tissue perfusion; peripheral (select once diagnosis is confirmed) Knowledge deficit related to disease process and management Goals: Patient/caregiver will verbalize understanding of disease process and disease management Date Initiated: 08/15/2019 Target Resolution Date: 11/02/2019 Goal Status: Active Interventions: Assess patient understanding of disease process and management upon diagnosis and as needed Assess peripheral arterial status upon admission and as needed Provide education on tissue oxygenation and ischemia Treatment  Activities: T ordered outside of clinic : 08/15/2019 est Notes: Wound/Skin Impairment Nursing Diagnoses: Impaired tissue integrity Knowledge deficit related to smoking impact on wound healing Knowledge deficit related to ulceration/compromised skin integrity Goals: Patient will demonstrate a reduced rate of smoking or cessation of smoking Date Initiated: 08/15/2019 Target Resolution Date: 11/02/2019 Goal Status: Active Patient/caregiver will verbalize understanding of skin care regimen Date Initiated: 08/15/2019 Target Resolution Date: 11/02/2019 Goal Status: Active Ulcer/skin breakdown will have a volume reduction of 30% by week 4 Date Initiated: 08/15/2019 Date Inactivated: 09/21/2019 Target Resolution Date: 09/12/2019 Goal Status: Met Interventions: Assess patient/caregiver ability to obtain necessary supplies Assess patient/caregiver ability to perform ulcer/skin care regimen upon admission and as needed Assess ulceration(s) every visit Provide education on ulcer and skin care Treatment Activities: Skin care regimen initiated : 08/15/2019 Topical wound management initiated : 08/15/2019 Notes: Electronic Signature(s) Signed: 10/26/2019 4:10:39 PM By: Kela Millin Entered By: Kela Millin on 10/26/2019 09:21:05 -------------------------------------------------------------------------------- Pain Assessment Details Patient Name: Date of Service: [name]. 10/26/2019 9:15 A M Medical Record Number: [id number] Patient Account Number: [id number] Date of Birth/Sex: Treating RN: 1960-07-02 (59 y.o. [name] Primary Care Provider: [name] Other Clinician: Referring Provider: Treating Provider/Extender: Domenic Polite Weeks in  Treatment: 10 Active Problems Location of Pain Severity and Description of Pain Patient Has Paino Yes Site Locations Pain Location: Pain in Ulcers With Dressing Change: Yes Duration of the  Pain. Constant / Intermittento Constant Rate the pain. Current Pain Level: 8 Worst Pain Level: 10 Least Pain Level: 5 Tolerable Pain Level: 6 Character of Pain Describe the Pain: Aching, Burning Pain Management and Medication Current Pain Management: Electronic Signature(s) Signed: 10/26/2019 4:10:39 PM By: Kela Millin Entered By: Kela Millin on 10/26/2019 09:16:54 -------------------------------------------------------------------------------- Patient/Caregiver Education Details Patient Name: Date of Service: [name] 10/1/2021andnbsp9:15 A M Medical Record Number: [id number] Patient Account Number: [id number] Date of Birth/Gender: Treating RN: 10-Mar-1960 (59 y.o. [name] Primary Care Physician: [name] Other Clinician: Referring Physician: Treating Physician/Extender: [name] in Treatment: 10 Education Assessment Education Provided To: Patient Education Topics Provided Tissue Oxygenation: Responses: State content correctly Wound/Skin Impairment: Responses: State content correctly Electronic Signature(s) Signed: 10/26/2019 4:10:39 PM By: Kela Millin Entered By: Kela Millin on 10/26/2019 09:21:17 -------------------------------------------------------------------------------- Wound Assessment Details Patient Name: Date of Service: [name]. 10/26/2019 9:15 A M Medical Record Number: [id number] Patient Account Number: [id number] Date of Birth/Sex: Treating RN: [birth year] (59 y.o. [name] Primary Care Kohan Azizi: [name] Other Clinician: Referring Crystle Carelli: Treating Ayush Boulet/Extender: Domenic Polite Weeks in Treatment: 10 Wound Status Wound Number: 1 Primary Diabetic Wound/Ulcer of the Lower Extremity Etiology: Wound Location: Right, Dorsal Foot Secondary Arterial Insufficiency Ulcer Wounding Event: Gradually  Appeared Etiology: Date Acquired: 03/26/2019 Wound Status: Open Weeks Of Treatment: 10 Comorbid Hypertension, Peripheral Arterial Disease, Type II Diabetes, Clustered Wound: No History: Neuropathy Wound Measurements Length: (cm) 2.1 Width: (cm) 2.2 Depth: (cm) 0.1 Area: (cm) 3.629 Volume: (cm) 0.363 % Reduction in Area: -28.4% % Reduction in Volume: -28.3% Epithelialization: Small (1-33%) Tunneling: No Undermining: No Wound Description Classification: Grade 2 Wound Margin: Flat and Intact Exudate Amount: Medium Exudate Type: Serosanguineous Exudate Color: red, brown Foul Odor After Cleansing: No Slough/Fibrino Yes Wound Bed Granulation Amount: Medium (34-66%) Exposed Structure Granulation Quality: Red, Pink Fascia Exposed: No Necrotic Amount: Medium (34-66%) Fat Layer (Subcutaneous Tissue) Exposed: Yes Necrotic Quality: Adherent Slough Tendon Exposed: No Muscle Exposed: No Joint Exposed: No Bone Exposed: No Treatment Notes Wound #1 (Right, Dorsal Foot) 1. Cleanse With Wound Cleanser 2. Periwound Care Skin Prep 3. Primary Dressing Applied Santyl 4. Secondary Dressing Dry Gauze Roll Gauze 5. Secured With Tape 7. Footwear/Offloading device applied Surgical shoe Electronic Signature(s) Signed: 10/26/2019 4:10:39 PM By: Kela Millin Entered By: Kela Millin on 10/26/2019 09:18:50 -------------------------------------------------------------------------------- Vitals Details Patient Name: Date of Service: [name], Ruben Kalata J. 10/26/2019 9:15 A M Medical Record Number: [id number] Patient Account Number: [id number] Date of Birth/Sex: Treating RN: 04-May-1960 (59 y.o. [name] Primary Care Cymone Yeske: [name] Other Clinician: Referring Saburo Luger: Treating Jasmene Goswami/Extender: Domenic Polite Weeks in Treatment: 10 Vital Signs Time Taken: 09:15 Temperature (F): 98.7 Height (in): 62 Pulse (bpm): 90 Weight (lbs):  130 Respiratory Rate (breaths/min): 19 Body Mass Index (BMI): 23.8 Blood Pressure (mmHg): 147/78 Reference Range: 80 - 120 mg / dl Electronic Signature(s) Signed: 10/26/2019 4:10:39 PM By: Kela Millin Entered By: Kela Millin on 10/26/2019 09:15:24

## 2019-11-09 ENCOUNTER — Encounter (HOSPITAL_BASED_OUTPATIENT_CLINIC_OR_DEPARTMENT_OTHER): Payer: Medicare Other | Admitting: Internal Medicine

## 2019-11-09 DIAGNOSIS — E11621 Type 2 diabetes mellitus with foot ulcer: Secondary | ICD-10-CM | POA: Diagnosis not present

## 2019-11-09 NOTE — Progress Notes (Signed)
[name], [name] ([id number]) Visit Report for 11/09/2019 HPI Details Patient Name: Date of Service: [name] 11/09/2019 9:15 A M Medical Record Number: [id number] Patient Account Number: [id number] Date of Birth/Sex: Treating RN: October 22, 1960 (59 y.o. [name] Primary Care Provider: [name] Other Clinician: Referring Provider: Treating Provider/Extender: [name] in Treatment: 12 History of Present Illness HPI Description: 08/15/2019 upon evaluation today patient presents for evaluation here in our clinic concerning issues that he is having with wounds on the dorsal surface of his right foot. This is something that was noted to occur after he was obtaining cortisone injections with [facility]. Subsequently he developed ulcerations he was then referred to Dr. Alvester Chou who performed arterial testing and subsequently found that the patient had poor arterial flow with an ABI of 0.6 and a TBI of 0.26. With that being said he has since on 07/16/2019 had an angiogram on the right with improvement of his ABI to 0.85 and TBI to 0.5. Obviously this is not perfect but is definitely far superior to where it was previous. He does have some necrotic tissue on the surface of the wounds is going to need debriding away he has been using Silvadene on this but to be honest that is not can to do anything for these wounds. He needs something more to clear this away sharp debridement is ideal and we may even look towards Santyl as well. The patient does have a history of diabetes noted in his chart though is not on medication and his A1c was around 6.4 so is not terrible. With that being said he does have known peripheral vascular disease he is actually have an angiogram on the left tomorrow. He also has hypertension and he does smoke. 8/27; this is a patient has been here once before about 6 weeks ago. Since then he was hospitalized from 7/22 through  7/23 by Dr. Gwenlyn Found for an attempt at revascularization. He had previously been seen by Dr. Amalia Hailey of podiatry. He is a continued tobacco smoker. His angiogram on 6/21 via revealed a occluded SFAs bilaterally with one-vessel runoff. He had a atherectomy followed by a drug-coated balloon angioplasty of the right SFA. 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 He had some improvement in his right ABI from 0.6-0.85. There is also an attempt to open his left SFA but that was unsuccessful. He does not have a wound on the left side. We are using Santyl on the patient's wounds on the right dorsal foot. He says he has about a 1 minute exercise tolerance before stopping because of pain compatible with claudication 9/10; we are using Santyl on the wound on the right dorsal foot. Still has significant claudication symptoms. He is status post angioplasty of the right SFA and atherectomy. He has one-vessel runoff via a diseased peroneal. He follows up with Dr. Gwenlyn Found on 9/14 9/17; I had my notes from last week that the patient was to see Dr. Gwenlyn Found on 9/14 he tells me that the appointment is actually on 9/21 although I do not see that in epic. The next appointment I see with Dr. Gwenlyn Found is on 11/17. Nevertheless the patient is fairly adamant that his appointment is early next week I told him to call ahead before he goes. The wound is a lot better we have been using Santyl over that he just ran out of that all represcribe it. 10/1; patient  arrives today with nothing on his wound. He says he has been using the Santyl but he feels the border foam is too painful. We told him he needs to put a covering on this of some sort perhaps gauze. Indeed after our discussion last time his appointment with Dr. Gwenlyn Found 11/17. Previously he has had a atherectomy and an angioplasty of the right SFA. I note in my previous notes it was said he had bilateral one-vessel runoff I'll need to review the tibial vessels on  the angiogram. The patient is currently rating his pain at 8 out of 10 He followed up with Dr. Amalia Hailey at [facility] on 9/27. He emphasized to follow-up with vascular and [place]. Follow-up with them as needed. 10/15; patient ran out of Santyl and has been using Silvadene. He still has constant pain which I think is largely claudication. I have looked over his angiogram results. I need to communicate with Dr. Gwenlyn Found to see if anything else can be done here. He arrives in clinic today with tendon over most of the wound surface area Electronic Signature(s) Signed: 11/09/2019 6:16:54 PM By: Linton Ham MD Entered By: Linton Ham on 11/09/2019 09:49:24 -------------------------------------------------------------------------------- Physical Exam Details Patient Name: Date of Service: [name]. 11/09/2019 9:15 A M Medical Record Number: [id number] Patient Account Number: [id number] Date of Birth/Sex: Treating RN: 28-Nov-1960 (59 y.o. [name] Primary Care Provider: [name] Other Clinician: Referring Provider: Treating Provider/Extender: Domenic Polite Weeks in Treatment: 12 Constitutional Sitting or standing Blood Pressure is within target range for patient.. Pulse regular and within target range for patient.Marland Kitchen Respirations regular, non-labored and within target range.. Temperature is normal and within the target range for the patient.Marland Kitchen Appears in no distress. Cardiovascular Needle pulses are not palpable at the dorsalis pedis posterior tibial or popliteal artery. Notes Wound exam; dorsal foot still very painful. Largely exposed tendon dorsally. No debridement. The proximal area actually seems better. Electronic Signature(s) Signed: 11/09/2019 6:16:54 PM By: Linton Ham MD Entered By: Linton Ham on 11/09/2019 09:50:18 -------------------------------------------------------------------------------- Physician Orders  Details Patient Name: Date of Service: [name], Ruben Kalata J. 11/09/2019 9:15 A M Medical Record Number: [id number] Patient Account Number: [id number] Date of Birth/Sex: Treating RN: April 19, 1960 (59 y.o. [name] Primary Care Provider: [name] Other Clinician: Referring Provider: Treating Provider/Extender: [name] in Treatment: 12 Verbal / Phone Orders: No Diagnosis Coding ICD-10 Coding Code Description I73.89 Other specified peripheral vascular diseases E11.621 Type 2 diabetes mellitus with foot ulcer L97.512 Non-pressure chronic ulcer of other part of right foot with fat layer exposed I10 Essential (primary) hypertension F17.218 Nicotine dependence, cigarettes, with other nicotine-induced disorders Follow-up Appointments Return Appointment in 2 weeks. Dressing Change Frequency Wound #1 Right,Dorsal Foot Change Dressing every other day. Wound Cleansing Wound #1 Right,Dorsal Foot May shower and wash wound with soap and water. Primary Wound Dressing Wound #1 Right,Dorsal Foot Silver Collagen - moisten with hydrogel or KY gel Secondary Dressing Wound #1 Right,Dorsal Foot Kerlix/Rolled Gauze Dry Gauze Off-Loading Open toe surgical shoe to: Additional Orders / Instructions Stop/Decrease Smoking Electronic Signature(s) Signed: 11/09/2019 6:16:54 PM By: Linton Ham MD Signed: 11/09/2019 6:20:53 PM By: Baruch Gouty RN, BSN Entered By: Baruch Gouty on 11/09/2019 48:54:62 -------------------------------------------------------------------------------- Problem List Details Patient Name: Date of Service: [name]. 11/09/2019 9:15 A M Medical Record Number: [id number] Patient Account Number: [id number] Date of Birth/Sex: Treating RN: April 19, 1960 (59 y.o. Ulyses Amor,  Vaughan Basta Primary Care Provider: [name] Other Clinician: Referring Provider: Treating Provider/Extender: Domenic Polite Weeks in Treatment: 12 Active Problems ICD-10 Encounter Code Description Active Date MDM Diagnosis I73.89 Other specified peripheral vascular diseases 08/15/2019 No Yes E11.621 Type 2 diabetes mellitus with foot ulcer 08/15/2019 No Yes L97.512 Non-pressure chronic ulcer of other part of right foot with fat layer exposed 08/15/2019 No Yes I10 Essential (primary) hypertension 08/15/2019 No Yes F17.218 Nicotine dependence, cigarettes, with other nicotine-induced disorders 08/15/2019 No Yes Inactive Problems Resolved Problems Electronic Signature(s) Signed: 11/09/2019 6:16:54 PM By: Linton Ham MD Entered By: Linton Ham on 11/09/2019 09:47:01 -------------------------------------------------------------------------------- Progress Note Details Patient Name: Date of Service: [name] 11/09/2019 9:15 A M Medical Record Number: [id number] Patient Account Number: [id number] Date of Birth/Sex: Treating RN: [birth year] (59 y.o. [name] Primary Care Provider: [name] Other Clinician: Referring Provider: Treating Provider/Extender: Domenic Polite Weeks in Treatment: 12 Subjective History of Present Illness (HPI) 08/15/2019 upon evaluation today patient presents for evaluation here in our clinic concerning issues that he is having with wounds on the dorsal surface of his right foot. This is something that was noted to occur after he was obtaining cortisone injections with [facility]. Subsequently he developed ulcerations he was then referred to Dr. Alvester Chou who performed arterial testing and subsequently found that the patient had poor arterial flow with an ABI of 0.6 and a TBI of 0.26. With that being said he has since on 07/16/2019 had an angiogram on the right with improvement of his ABI to 0.85 and TBI to 0.5. Obviously this is not perfect but is definitely far superior to where it was previous. He does have some necrotic tissue on  the surface of the wounds is going to need debriding away he has been using Silvadene on this but to be honest that is not can to do anything for these wounds. He needs something more to clear this away sharp debridement is ideal and we may even look towards Santyl as well. The patient does have a history of diabetes noted in his chart though is not on medication and his A1c was around 6.4 so is not terrible. With that being said he does have known peripheral vascular disease he is actually have an angiogram on the left tomorrow. He also has hypertension and he does smoke. 8/27; this is a patient has been here once before about 6 weeks ago. Since then he was hospitalized from 7/22 through 7/23 by Dr. Gwenlyn Found for an attempt at revascularization. He had previously been seen by Dr. Amalia Hailey of podiatry. He is a continued tobacco smoker. His angiogram on 6/21 via revealed a occluded SFAs bilaterally with one-vessel runoff. He had a atherectomy followed by a drug-coated balloon angioplasty of the right SFA. 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 He had some improvement in his right ABI from 0.6-0.85. There is also an attempt to open his left SFA but that was unsuccessful. He does not have a wound on the left side. We are using Santyl on the patient's wounds on the right dorsal foot. He says he has about a 1 minute exercise tolerance before stopping because of pain compatible with claudication 9/10; we are using Santyl on the wound on the right dorsal foot. Still has significant claudication symptoms. He is status post angioplasty of the right SFA and atherectomy. He has one-vessel runoff via a  diseased peroneal. He follows up with Dr. Gwenlyn Found on 9/14 9/17; I had my notes from last week that the patient was to see Dr. Gwenlyn Found on 9/14 he tells me that the appointment is actually on 9/21 although I do not see that in epic. The next appointment I see with Dr. Gwenlyn Found is on 11/17.  Nevertheless the patient is fairly adamant that his appointment is early next week I told him to call ahead before he goes. The wound is a lot better we have been using Santyl over that he just ran out of that all represcribe it. 10/1; patient arrives today with nothing on his wound. He says he has been using the Santyl but he feels the border foam is too painful. We told him he needs to put a covering on this of some sort perhaps gauze. Indeed after our discussion last time his appointment with Dr. Gwenlyn Found 11/17. Previously he has had a atherectomy and an angioplasty of the right SFA. I note in my previous notes it was said he had bilateral one-vessel runoff I'll need to review the tibial vessels on the angiogram. The patient is currently rating his pain at 8 out of 10 He followed up with Dr. Amalia Hailey at [facility] on 9/27. He emphasized to follow-up with vascular and [place]. Follow-up with them as needed. 10/15; patient ran out of Santyl and has been using Silvadene. He still has constant pain which I think is largely claudication. I have looked over his angiogram results. I need to communicate with Dr. Gwenlyn Found to see if anything else can be done here. He arrives in clinic today with tendon over most of the wound surface area Objective Constitutional Sitting or standing Blood Pressure is within target range for patient.. Pulse regular and within target range for patient.Marland Kitchen Respirations regular, non-labored and within target range.. Temperature is normal and within the target range for the patient.Marland Kitchen Appears in no distress. Vitals Time Taken: 9:04 AM, Height: 62 in, Weight: 130 lbs, BMI: 23.8, Temperature: 98.4 F, Pulse: 94 bpm, Respiratory Rate: 18 breaths/min, Blood Pressure: 143/85 mmHg. Cardiovascular Needle pulses are not palpable at the dorsalis pedis posterior tibial or popliteal artery. General Notes: Wound exam; dorsal foot still very painful. Largely exposed tendon dorsally. No  debridement. The proximal area actually seems better. Integumentary (Hair, Skin) Wound #1 status is Open. Original cause of wound was Gradually Appeared. The wound is located on the Right,Dorsal Foot. The wound measures 1.7cm length x 1.5cm width x 0.3cm depth; 2.003cm^2 area and 0.601cm^3 volume. There is tendon and Fat Layer (Subcutaneous Tissue) exposed. There is no tunneling or undermining noted. There is a medium amount of serosanguineous drainage noted. The wound margin is flat and intact. There is large (67-100%) red, pink granulation within the wound bed. There is a small (1-33%) amount of necrotic tissue within the wound bed including Adherent Slough. Assessment Active Problems ICD-10 Other specified peripheral vascular diseases Type 2 diabetes mellitus with foot ulcer Non-pressure chronic ulcer of other part of right foot with fat layer exposed Essential (primary) hypertension Nicotine dependence, cigarettes, with other nicotine-induced disorders Plan Follow-up Appointments: Return Appointment in 2 weeks. Dressing Change Frequency: Wound #1 Right,Dorsal Foot: Change Dressing every other day. Wound Cleansing: Wound #1 Right,Dorsal Foot: May shower and wash wound with soap and water. Primary Wound Dressing: Wound #1 Right,Dorsal Foot: Silver Collagen - moisten with hydrogel or KY gel Secondary Dressing: Wound #1 Right,Dorsal Foot: Kerlix/Rolled Gauze Dry Gauze Off-Loading: Open toe surgical shoe  to: Additional Orders / Instructions: Stop/Decrease Smoking 1. Wound is certainly not anywhere near healing. Exposed tendon today. I change the dressing to silver collagen but I do not know that were going to get any closure here. 2. The patient is still smoking and I talked to him about this. 3. I looked over his angiogram reports. Indeed he had one-vessel runoff in the right via the peroneal. He did have a atherectomy and angioplasty of the right SFA done by Dr. Gwenlyn Found in July.  Nevertheless things are not healing he still in a lot of pain. I will communicate with Dr. Gwenlyn Found today to see if there is anything that can be done especially with the anterior tibial artery although from the tone of the 2 angiograms he has had I am not optimistic Electronic Signature(s) Signed: 11/09/2019 6:16:54 PM By: Linton Ham MD Entered By: Linton Ham on 11/09/2019 09:55:03 -------------------------------------------------------------------------------- SuperBill Details Patient Name: Date of Service: [name], [name] 11/09/2019 Medical Record Number: [id number] Patient Account Number: [id number] Date of Birth/Sex: Treating RN: Feb 21, 1960 (59 y.o. [name] Primary Care Provider: [name] Other Clinician: Referring Provider: Treating Provider/Extender: Domenic Polite Weeks in Treatment: 12 Diagnosis Coding ICD-10 Codes Code Description I73.89 Other specified peripheral vascular diseases E11.621 Type 2 diabetes mellitus with foot ulcer L97.512 Non-pressure chronic ulcer of other part of right foot with fat layer exposed I10 Essential (primary) hypertension F17.218 Nicotine dependence, cigarettes, with other nicotine-induced disorders Facility Procedures CPT4 Code: [id number] Description: 99213 - WOUND CARE VISIT-LEV 3 EST PT Modifier: Quantity: 1 Physician Procedures : CPT4 Code Description Modifier [id number] - WC PHYS LEVEL 4 - EST PT ICD-10 Diagnosis Description E11.621 Type 2 diabetes mellitus with foot ulcer L97.512 Non-pressure chronic ulcer of other part of right foot with fat layer exposed Quantity: 1 Electronic Signature(s) Signed: 11/09/2019 6:16:54 PM By: Linton Ham MD Entered By: Linton Ham on 11/09/2019 09:55:21

## 2019-11-09 NOTE — Progress Notes (Signed)
[name], [name] ([id number]) Visit Report for 11/09/2019 Arrival Information Details Patient Name: Date of Service: [name] 11/09/2019 9:15 A M Medical Record Number: [id number] Patient Account Number: [id number] Date of Birth/Sex: Treating RN: 1961-01-14 (59 y.o. [name] Primary Care [name]: [name] Other Clinician: Referring [name]: Treating [name]/Extender: [name] in Treatment: 12 Visit Information History Since Last Visit Added or deleted any medications: No Patient Arrived: Ambulatory Any new allergies or adverse reactions: No Arrival Time: 09:03 Had a fall or experienced change in No Accompanied By: self activities of daily living that may affect Transfer Assistance: None risk of falls: Patient Identification Verified: Yes Signs or symptoms of abuse/neglect since No Secondary Verification Process Completed: Yes last visito Patient Requires Transmission-Based Precautions: No Hospitalized since last visit: No Patient Has Alerts: Yes Implantable device outside of the clinic No Patient Alerts: Patient on Blood Thinner excluding R ABI: 0.85 R TBI: 0.50 cellular tissue based products placed in the center since last visit: Has Dressing in Place as Prescribed: Yes Has Footwear/Offloading in Place as Yes Prescribed: Right: Surgical Shoe with Pressure Relief Insole Pain Present Now: Yes Electronic Signature(s) Signed: 11/09/2019 5:54:53 PM By: [name] Entered By: [name] on 11/09/2019 09:08:45 -------------------------------------------------------------------------------- Clinic Level of Care Assessment Details Patient Name: Date of Service: [name] 11/09/2019 9:15 A M Medical Record Number: [id number] Patient Account Number: [id number] Date of Birth/Sex: Treating RN: 12-Mar-1960 (59 y.o. [name] Primary Care Ashrita Chrismer: [name] Other Clinician: Referring  [name]: Treating [name]/Extender: [name] in Treatment: 12 Clinic Level of Care Assessment Items TOOL 4 Quantity Score [] - 0 Use when only an EandM is performed on FOLLOW-UP visit ASSESSMENTS - Nursing Assessment / Reassessment X- 1 10 Reassessment of Co-morbidities (includes updates in patient status) X- 1 5 Reassessment of Adherence to Treatment Plan ASSESSMENTS - Wound and Skin A ssessment / Reassessment X - Simple Wound Assessment / Reassessment - one wound 1 5 [] - 0 Complex Wound Assessment / Reassessment - multiple wounds [] - 0 Dermatologic / Skin Assessment (not related to wound area) ASSESSMENTS - Focused Assessment [] - 0 Circumferential Edema Measurements - multi extremities [] - 0 Nutritional Assessment / Counseling / Intervention X- 1 5 Lower Extremity Assessment (monofilament, tuning fork, pulses) [] - 0 Peripheral Arterial Disease Assessment (using hand held doppler) ASSESSMENTS - Ostomy and/or Continence Assessment and Care [] - 0 Incontinence Assessment and Management [] - 0 Ostomy Care Assessment and Management (repouching, etc.) PROCESS - Coordination of Care X - Simple Patient / Family Education for ongoing care 1 15 [] - 0 Complex (extensive) Patient / Family Education for ongoing care X- 1 10 Staff obtains Programmer, systems, Records, T Results / Process Orders est [] - 0 Staff telephones HHA, Nursing Homes / Clarify orders / etc [] - 0 Routine Transfer to another Facility (non-emergent condition) [] - 0 Routine Hospital Admission (non-emergent condition) [] - 0 New Admissions / Biomedical engineer / Ordering NPWT Apligraf, etc. , [] - 0 Emergency Hospital Admission (emergent condition) X- 1 10 Simple Discharge Coordination [] - 0 Complex (extensive) Discharge Coordination PROCESS - Special Needs [] - 0 Pediatric / Minor Patient Management [] - 0 Isolation Patient Management [] - 0 Hearing / Language /  Visual special needs [] - 0 Assessment of Community assistance (transportation, D/C planning, etc.) [] - 0 Additional assistance / Altered mentation [] - 0  Support Surface(s) Assessment (bed, cushion, seat, etc.) INTERVENTIONS - Wound Cleansing / Measurement X - Simple Wound Cleansing - one wound 1 5 [] - 0 Complex Wound Cleansing - multiple wounds X- 1 5 Wound Imaging (photographs - any number of wounds) [] - 0 Wound Tracing (instead of photographs) X- 1 5 Simple Wound Measurement - one wound [] - 0 Complex Wound Measurement - multiple wounds INTERVENTIONS - Wound Dressings X - Small Wound Dressing one or multiple wounds 1 10 [] - 0 Medium Wound Dressing one or multiple wounds [] - 0 Large Wound Dressing one or multiple wounds X- 1 5 Application of Medications - topical [] - 0 Application of Medications - injection INTERVENTIONS - Miscellaneous [] - 0 External ear exam [] - 0 Specimen Collection (cultures, biopsies, blood, body fluids, etc.) [] - 0 Specimen(s) / Culture(s) sent or taken to Lab for analysis [] - 0 Patient Transfer (multiple staff / Civil Service fast streamer / Similar devices) [] - 0 Simple Staple / Suture removal (25 or less) [] - 0 Complex Staple / Suture removal (26 or more) [] - 0 Hypo / Hyperglycemic Management (close monitor of Blood Glucose) [] - 0 Ankle / Brachial Index (ABI) - do not check if billed separately X- 1 5 Vital Signs Has the patient been seen at the hospital within the last three years: Yes Total Score: 95 Level Of Care: New/Established - Level 3 Electronic Signature(s) Signed: 11/09/2019 6:20:53 PM By: Baruch Gouty RN, BSN Entered By: Baruch Gouty on 11/09/2019 09:26:03 -------------------------------------------------------------------------------- Encounter Discharge Information Details Patient Name: Date of Service: Ruben Leavell J. 11/09/2019 9:15 A M Medical Record Number: [id number] Patient Account Number:  [id number] Date of Birth/Sex: Treating RN: [birth year] (59 y.o. [name] Primary Care Provider: Twanna Huang Other Clinician: Referring Provider: Treating Provider/Extender: [name] in Treatment: 12 Encounter Discharge Information Items Discharge Condition: Stable Ambulatory Status: Ambulatory Discharge Destination: Home Transportation: Private Auto Accompanied By: self Schedule Follow-up Appointment: Yes Clinical Summary of Care: Electronic Signature(s) Signed: 11/09/2019 5:54:53 PM By: [name] Entered By: [name] on 11/09/2019 09:34:41 -------------------------------------------------------------------------------- Lower Extremity Assessment Details Patient Name: Date of Service: [name] 11/09/2019 9:15 A M Medical Record Number: [id number] Patient Account Number: [id number] Date of Birth/Sex: Treating RN: 12-26-1960 (59 y.o. [name] Primary Care Provider: Twanna Huang Other Clinician: Referring Provider: Treating Provider/Extender: Domenic Polite Weeks in Treatment: 12 Edema Assessment Assessed: Shirlyn Goltz: No] Patrice Paradise: Yes] Edema: [Left: N] [Right: o] Calf Left: Right: Point of Measurement: 28 cm From Medial Instep 28 cm Ankle Left: Right: Point of Measurement: 9 cm From Medial Instep 17 cm Vascular Assessment Pulses: Dorsalis Pedis Palpable: [Right:Yes] Electronic Signature(s) Signed: 11/09/2019 5:54:53 PM By: [name] Entered By: [name] on 11/09/2019 09:09:42 -------------------------------------------------------------------------------- Multi Wound Chart Details Patient Name: Date of Service: Ruben Leavell J. 11/09/2019 9:15 A M Medical Record Number: [id number] Patient Account Number: [id number] Date of Birth/Sex: Treating RN: [birth year] (59 y.o. 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] [N/A:N/A] Secondary Etiology: [1:Hypertension, Peripheral Arterial] [N/A:N/A] Comorbid  History: [1:Disease, Type II Diabetes, Neuropathy 03/26/2019] [N/A:N/A] Date Acquired: [1:12] [N/A:N/A] Weeks of Treatment: [1:Open] [N/A:N/A] Wound Status: [1:1.7x1.5x0.3] [N/A:N/A] Measurements L x W x D (cm) [1:2.003] [N/A:N/A] A (cm) : rea [1:0.601] [N/A:N/A] Volume (cm) : [1:29.10%] [N/A:N/A] % Reduction in A rea: [1:-112.40%] [N/A:N/A] % Reduction in Volume: [1:Grade 2] [N/A:N/A] Classification: [1:Medium] [N/A:N/A] Exudate A mount: [1:Serosanguineous] [N/A:N/A] Exudate Type: [1:red, brown] [N/A:N/A] Exudate Color: [1:Flat and Intact] [N/A:N/A] Wound Margin: [1:Large (67-100%)] [N/A:N/A] Granulation A mount: [1:Red, Pink] [N/A:N/A] Granulation Quality: [1:Small (1-33%)] [N/A:N/A] Necrotic A mount: [1:Fat Layer (Subcutaneous Tissue): Yes N/A] Exposed Structures: [1:Tendon: Yes Fascia: No Muscle: No Joint: No Bone: No Small (1-33%)] [N/A:N/A] Treatment Notes Wound #1 (Right, Dorsal Foot) 1. Cleanse With Wound Cleanser 3. Primary Dressing Applied Collegen AG Hydrogel or K-Y Jelly 4. Secondary Dressing Dry Gauze Roll Gauze 5. Secured With Medipore tape Notes netting. explained the orders, and new dressing. explained how supplies will arrive to his home. patient in agreement. Electronic Signature(s) Signed: 11/09/2019 6:16:54 PM By: Linton Ham MD Signed: 11/09/2019 6:20:53 PM By: Baruch Gouty RN,  BSN Entered By: Linton Ham on 11/09/2019 09:47:11 -------------------------------------------------------------------------------- Multi-Disciplinary Care Plan Details Patient Name: Date of Service: [place] RD, [name] 11/09/2019 9:15 A M Medical Record Number: [id number] Patient Account Number: [id number] Date of Birth/Sex: Treating RN: Dec 06, 1960 (59 y.o. [name] Primary Care Provider: Twanna Huang Other Clinician: Referring Provider: Treating Provider/Extender: [name] in Treatment: 12 Active Inactive Nutrition Nursing Diagnoses: Impaired glucose control: actual or potential Potential for alteratiion in Nutrition/Potential for imbalanced nutrition Goals: Patient/caregiver will maintain therapeutic glucose control Date Initiated: 08/15/2019 Target Resolution Date: 11/30/2019 Goal Status: Active Interventions: Assess HgA1c results as ordered upon admission and as needed Assess patient nutrition upon admission and as needed per policy Treatment Activities: Patient referred to Primary Care Physician for further nutritional evaluation : 08/15/2019 Notes: Tissue Oxygenation Nursing Diagnoses: Actual ineffective tissue perfusion; peripheral (select once diagnosis is confirmed) Knowledge deficit related to disease process and management Goals: Patient/caregiver will verbalize understanding of disease process and disease management Date Initiated: 08/15/2019 Target Resolution Date: 11/30/2019 Goal Status: Active Interventions: Assess patient understanding of disease process and management upon diagnosis and as needed Assess peripheral arterial status upon admission and as needed Provide education on tissue oxygenation and ischemia Treatment Activities: T ordered outside of clinic : 08/15/2019 est Notes: Wound/Skin Impairment Nursing Diagnoses: Impaired tissue integrity Knowledge deficit related to smoking impact on wound  healing Knowledge deficit related to ulceration/compromised skin integrity Goals: Patient will demonstrate a reduced rate of smoking or cessation of smoking Date Initiated: 08/15/2019 Target Resolution Date: 11/30/2019 Goal Status: Active Patient/caregiver will verbalize understanding of skin care regimen Date Initiated: 08/15/2019 Target Resolution Date: 11/30/2019 Goal Status: Active Ulcer/skin breakdown will have a volume reduction of 30% by week 4 Date Initiated: 08/15/2019 Date Inactivated: 09/21/2019 Target Resolution Date: 09/12/2019 Goal Status: Met Interventions: Assess patient/caregiver ability to obtain necessary supplies Assess patient/caregiver ability to perform ulcer/skin care regimen upon admission and as needed Assess ulceration(s) every visit Provide education on ulcer and skin care Treatment Activities: Skin care regimen initiated : 08/15/2019 Topical wound management initiated : 08/15/2019 Notes: Electronic Signature(s) Signed: 11/09/2019 6:20:53 PM By: Baruch Gouty RN, BSN Entered By: Baruch Gouty on 11/09/2019 09:11:42 -------------------------------------------------------------------------------- Pain Assessment Details Patient Name: Date of Service: [name]. 11/09/2019 9:15 A M Medical Record Number: [id number] Patient Account Number: [id number] Date of Birth/Sex: Treating RN: 1960/11/09 (59 y.o. M) Rolin Barry,  JDYNX Primary Care Ladene Allocca: [name] Other Clinician: Referring Savon Cobbs: Treating Watt Geiler/Extender: [name] in Treatment: 12 Active Problems Location of Pain Severity and Description of Pain Patient Has Paino Yes Site Locations Pain Location: Pain Location: Pain in Ulcers Rate the pain. Current Pain Level: 9 Worst Pain Level: 10 Least Pain Level: 0 Tolerable Pain Level: 8 Character of Pain Describe the Pain: Heavy, Sharp Pain Management and Medication Current Pain  Management: Medication: Yes Cold Application: No Rest: Yes Massage: No Activity: No T.E.N.S.: No Heat Application: No Leg drop or elevation: No Is the Current Pain Management Adequate: Adequate How does your wound impact your activities of daily livingo Sleep: Yes Bathing: No Appetite: No Relationship With Others: No Bladder Continence: No Emotions: No Bowel Continence: No Work: No Toileting: No Drive: No Dressing: No Hobbies: Yes Electronic Signature(s) Signed: 11/09/2019 5:54:53 PM By: [name] Entered By: [name] on 11/09/2019 09:09:31 -------------------------------------------------------------------------------- Patient/Caregiver Education Details Patient Name: Date of Service: [name] 10/15/2021andnbsp9:15 A M Medical Record Number: [id number] Patient Account Number: [id number] Date of Birth/Gender: Treating RN: 09-06-1960 (59 y.o. [name] Primary Care Physician: [name] Other Clinician: Referring Physician: Treating Physician/Extender: [name] in Treatment: 12 Education Assessment Education Provided To: Patient Education Topics Provided Smoking and Wound Healing: Methods: Explain/Verbal Responses: Reinforcements needed, State content correctly Tissue Oxygenation: Methods: Explain/Verbal Responses: Reinforcements needed Wound/Skin Impairment: Methods: Explain/Verbal Responses: Reinforcements needed, State content correctly Electronic Signature(s) Signed: 11/09/2019 6:20:53 PM By: Baruch Gouty RN, BSN Entered By: Baruch Gouty on 11/09/2019 09:12:25 -------------------------------------------------------------------------------- Wound Assessment Details Patient Name: Date of Service: [name]. 11/09/2019 9:15 A M Medical Record Number: [id number] Patient Account Number: [id number] Date of Birth/Sex: Treating RN: 1960/12/28 (59 y.o. Lorette Ang, Meta.Reding Primary  Care Janeese Mcgloin: [name] Other Clinician: Referring Marcianne Ozbun: Treating Inice Sanluis/Extender: Domenic Polite Weeks in Treatment: 12 Wound Status Wound Number: 1 Primary Diabetic Wound/Ulcer of the Lower Extremity Etiology: Wound Location: Right, Dorsal Foot Secondary Arterial Insufficiency Ulcer Wounding Event: Gradually Appeared Etiology: Date Acquired: 03/26/2019 Wound Status: Open Weeks Of Treatment: 12 Comorbid Hypertension, Peripheral Arterial Disease, Type II Diabetes, Clustered Wound: No History: Neuropathy Wound Measurements Length: (cm) 1.7 Width: (cm) 1.5 Depth: (cm) 0.3 Area: (cm) 2.003 Volume: (cm) 0.601 % Reduction in Area: 29.1% % Reduction in Volume: -112.4% Epithelialization: Small (1-33%) Tunneling: No Undermining: No Wound Description Classification: Grade 2 Wound Margin: Flat and Intact Exudate Amount: Medium Exudate Type: Serosanguineous Exudate Color: red, brown Foul Odor After Cleansing: No Slough/Fibrino Yes Wound Bed Granulation Amount: Large (67-100%) Exposed Structure Granulation Quality: Red, Pink Fascia Exposed: No Necrotic Amount: Small (1-33%) Fat Layer (Subcutaneous Tissue) Exposed: Yes Necrotic Quality: Adherent Slough Tendon Exposed: Yes Muscle Exposed: No Joint Exposed: No Bone Exposed: No Treatment Notes Wound #1 (Right, Dorsal Foot) 1. Cleanse With Wound Cleanser 3. Primary Dressing Applied Collegen AG Hydrogel or K-Y Jelly 4. Secondary Dressing Dry Gauze Roll Gauze 5. Secured With Medipore tape Notes netting. explained the orders, and new dressing. explained how supplies will arrive to his home. patient in agreement. Electronic Signature(s) Signed: 11/09/2019 5:54:53 PM By: [name] Entered By: [name] on 11/09/2019 09:11:22 -------------------------------------------------------------------------------- Vitals Details Patient Name: Date of Service: [name], [name] 11/09/2019  9:15 A M Medical Record Number: [id number] Patient Account Number: [id number] Date of Birth/Sex: Treating RN: Apr 08, 1960 (59 y.o. [name] Primary Care Miliani Deike: [name] Other Clinician:  Referring Provider: Treating Provider/Extender: Domenic Polite Weeks in Treatment: 12 Vital Signs Time Taken: 09:04 Temperature (F): 98.4 Height (in): 62 Pulse (bpm): 94 Weight (lbs): 130 Respiratory Rate (breaths/min): 18 Body Mass Index (BMI): 23.8 Blood Pressure (mmHg): 143/85 Reference Range: 80 - 120 mg / dl Electronic Signature(s) Signed: 11/09/2019 5:54:53 PM By: [name] Entered By: [name] on 11/09/2019 09:08:57

## 2019-11-23 ENCOUNTER — Encounter (HOSPITAL_BASED_OUTPATIENT_CLINIC_OR_DEPARTMENT_OTHER): Payer: Medicare Other | Admitting: Internal Medicine

## 2019-11-23 ENCOUNTER — Other Ambulatory Visit: Payer: Self-pay

## 2019-11-23 DIAGNOSIS — E11621 Type 2 diabetes mellitus with foot ulcer: Secondary | ICD-10-CM | POA: Diagnosis not present

## 2019-11-26 NOTE — Progress Notes (Signed)
[name], [name] ([id number]) Visit Report for 11/23/2019 Arrival Information Details Patient Name: Date of Service: [place] 11/23/2019 9:15 A M Medical Record Number: [id number] Patient Account Number: [id number] Date of Birth/Sex: Treating RN: [birth year] (59 y.o. [name] Primary Care [name]: [name] Other Clinician: Referring [name]: Treating [name]/Extender: [name] in Treatment: 14 Visit Information History Since Last Visit Added or deleted any medications: No Patient Arrived: Ambulatory Any new allergies or adverse reactions: No Arrival Time: 09:09 Had a fall or experienced change in No Accompanied By: self activities of daily living that may affect Transfer Assistance: None risk of falls: Patient Identification Verified: Yes Signs or symptoms of abuse/neglect since last visito No Secondary Verification Process Completed: Yes Hospitalized since last visit: No Patient Requires Transmission-Based Precautions: No Implantable device outside of the clinic excluding No Patient Has Alerts: Yes cellular tissue based products placed in the center Patient Alerts: Patient on Blood Thinner since last visit: R ABI: 0.85 R TBI: 0.50 Has Dressing in Place as Prescribed: Yes Pain Present Now: No Electronic Signature(s) Signed: 11/23/2019 4:26:07 PM By: Deon Pilling Entered By: Deon Pilling on 11/23/2019 09:11:12 -------------------------------------------------------------------------------- Clinic Level of Care Assessment Details Patient Name: Date of Service: [name] 11/23/2019 9:15 A M Medical Record Number: [id number] Patient Account Number: [id number] Date of Birth/Sex: Treating RN: 01-16-1961 (59 y.o. [name] Primary Care Nikolai Wilczak: [name] Other Clinician: Referring [name]: Treating [name]/Extender: [name] in Treatment: 14 Clinic Level of  Care Assessment Items TOOL 4 Quantity Score '[]'  - 0 Use when only an EandM is performed on FOLLOW-UP visit ASSESSMENTS - Nursing Assessment / Reassessment X- 1 10 Reassessment of Co-morbidities (includes updates in patient status) X- 1 5 Reassessment of Adherence to Treatment Plan ASSESSMENTS - Wound and Skin A ssessment / Reassessment '[]'  - 0 Simple Wound Assessment / Reassessment - one wound X- 2 5 Complex Wound Assessment / Reassessment - multiple wounds '[]'  - 0 Dermatologic / Skin Assessment (not related to wound area) ASSESSMENTS - Focused Assessment '[]'  - 0 Circumferential Edema Measurements - multi extremities '[]'  - 0 Nutritional Assessment / Counseling / Intervention X- 1 5 Lower Extremity Assessment (monofilament, tuning fork, pulses) '[]'  - 0 Peripheral Arterial Disease Assessment (using hand held doppler) ASSESSMENTS - Ostomy and/or Continence Assessment and Care '[]'  - 0 Incontinence Assessment and Management '[]'  - 0 Ostomy Care Assessment and Management (repouching, etc.) PROCESS - Coordination of Care X - Simple Patient / Family Education for ongoing care 1 15 '[]'  - 0 Complex (extensive) Patient / Family Education for ongoing care X- 1 10 Staff obtains Programmer, systems, Records, T Results / Process Orders est '[]'  - 0 Staff telephones HHA, Nursing Homes / Clarify orders / etc '[]'  - 0 Routine Transfer to another Facility (non-emergent condition) '[]'  - 0 Routine Hospital Admission (non-emergent condition) '[]'  - 0 New Admissions / Biomedical engineer / Ordering NPWT Apligraf, etc. , '[]'  - 0 Emergency Hospital Admission (emergent condition) X- 1 10 Simple Discharge Coordination '[]'  - 0 Complex (extensive) Discharge Coordination PROCESS - Special Needs '[]'  - 0 Pediatric / Minor Patient Management '[]'  - 0 Isolation Patient Management '[]'  - 0 Hearing / Language / Visual special needs '[]'  - 0 Assessment of Community assistance (transportation, D/C planning, etc.) '[]'  -  0 Additional assistance / Altered mentation '[]'  - 0 Support Surface(s) Assessment (bed, cushion, seat, etc.) INTERVENTIONS - Wound Cleansing / Measurement '[]'  -  0 Simple Wound Cleansing - one wound X- 2 5 Complex Wound Cleansing - multiple wounds X- 1 5 Wound Imaging (photographs - any number of wounds) '[]'  - 0 Wound Tracing (instead of photographs) '[]'  - 0 Simple Wound Measurement - one wound X- 2 5 Complex Wound Measurement - multiple wounds INTERVENTIONS - Wound Dressings X - Small Wound Dressing one or multiple wounds 2 10 '[]'  - 0 Medium Wound Dressing one or multiple wounds '[]'  - 0 Large Wound Dressing one or multiple wounds X- 1 5 Application of Medications - topical '[]'  - 0 Application of Medications - injection INTERVENTIONS - Miscellaneous '[]'  - 0 External ear exam '[]'  - 0 Specimen Collection (cultures, biopsies, blood, body fluids, etc.) '[]'  - 0 Specimen(s) / Culture(s) sent or taken to Lab for analysis '[]'  - 0 Patient Transfer (multiple staff / Civil Service fast streamer / Similar devices) '[]'  - 0 Simple Staple / Suture removal (25 or less) '[]'  - 0 Complex Staple / Suture removal (26 or more) '[]'  - 0 Hypo / Hyperglycemic Management (close monitor of Blood Glucose) '[]'  - 0 Ankle / Brachial Index (ABI) - do not check if billed separately X- 1 5 Vital Signs Has the patient been seen at the hospital within the last three years: Yes Total Score: 120 Level Of Care: New/Established - Level 4 Electronic Signature(s) Signed: 11/23/2019 4:23:42 PM By: Baruch Gouty RN, BSN Entered By: Baruch Gouty on 11/23/2019 10:22:55 -------------------------------------------------------------------------------- Encounter Discharge Information Details Patient Name: Date of Service: Ruben Leavell J. 11/23/2019 9:15 A M Medical Record Number: [id number] Patient Account Number: [id number] Date of Birth/Sex: Treating RN: [birth year] (59 y.o. [name] Primary Care Taheerah Guldin: [name]  Other Clinician: Referring Martie Fulgham: Treating Lynnet Hefley/Extender: [name] in Treatment: 14 Encounter Discharge Information Items Discharge Condition: Stable Ambulatory Status: Ambulatory Discharge Destination: Home Transportation: Private Auto Accompanied By: self Schedule Follow-up Appointment: Yes Clinical Summary of Care: Electronic Signature(s) Signed: 11/23/2019 4:26:07 PM By: Deon Pilling Entered By: Deon Pilling on 11/23/2019 12:08:27 -------------------------------------------------------------------------------- Lower Extremity Assessment Details Patient Name: Date of Service: [name]. 11/23/2019 9:15 A M Medical Record Number: [id number] Patient Account Number: [id number] Date of Birth/Sex: Treating RN: Nov 26, 1960 (59 y.o. [name] Primary Care Virgal Warmuth: [name] Other Clinician: Referring Halcyon Heck: Treating Arryn Terrones/Extender: Domenic Polite Weeks in Treatment: 14 Edema Assessment Assessed: Shirlyn Goltz: No] Patrice Paradise: Yes] Edema: [Left: N] [Right: o] Calf Left: Right: Point of Measurement: 28 cm From Medial Instep 26 cm Ankle Left: Right: Point of Measurement: 9 cm From Medial Instep 17.5 cm Vascular Assessment Pulses: Dorsalis Pedis Palpable: [Right:Yes] Electronic Signature(s) Signed: 11/23/2019 4:26:07 PM By: Deon Pilling Entered By: Deon Pilling on 11/23/2019 09:12:43 -------------------------------------------------------------------------------- Multi Wound Chart Details Patient Name: Date of Service: Ruben Leavell J. 11/23/2019 9:15 A M Medical Record Number: [id number] Patient Account Number: [id number] Date of Birth/Sex: Treating RN: 31-May-1960 (59 y.o. 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] [N/A:N/A] Secondary Etiology: [1:Hypertension, Peripheral Arterial] [N/A:N/A] Comorbid History: [1:Disease, Type II Diabetes, Neuropathy 03/26/2019] [N/A:N/A] Date Acquired: [1:14] [N/A:N/A] Weeks of Treatment: [1:Open] [  N/A:N/A] Wound Status: [1:1.5x1.5x0.3] [N/A:N/A] Measurements L x W x D (cm) [1:1.767] [N/A:N/A] A (cm) : rea [1:0.53] [N/A:N/A] Volume (cm) : [1:37.50%] [N/A:N/A] % Reduction in A rea: [1:-87.30%] [N/A:N/A] % Reduction in Volume: [1:Grade 2] [N/A:N/A] Classification: [1:Medium] [N/A:N/A] Exudate A mount: [1:Serosanguineous] [N/A:N/A] Exudate Type: [1:red, brown] [N/A:N/A] Exudate Color: [1:Flat and Intact] [N/A:N/A] Wound Margin: [1:Medium (34-66%)] [N/A:N/A] Granulation A mount: [1:Red, Pink] [N/A:N/A] Granulation Quality: [1:Medium (34-66%)] [N/A:N/A] Necrotic A mount: [1:Fat Layer (Subcutaneous Tissue): Yes N/A] Exposed Structures: [1:Tendon: Yes Fascia: No Muscle: No Joint: No Bone: No Small (1-33%)] [N/A:N/A] Treatment Notes Electronic Signature(s) Signed: 11/23/2019 4:23:42 PM By: Baruch Gouty RN, BSN Signed: 11/26/2019 2:10:44 PM By: Linton Ham MD Entered By: Linton Ham on 11/23/2019 10:40:13 -------------------------------------------------------------------------------- Multi-Disciplinary Care Plan Details Patient Name: Date of Service: May RD, [name] 11/23/2019 9:15 A M Medical Record Number: [id number] Patient Account Number: [id number] Date of Birth/Sex: Treating RN: 12/28/1960 (59 y.o. [name] Primary Care Sophie Tamez: [name] Other Clinician: Referring  Joelene Barriere: Treating Angi Goodell/Extender: [name] in Treatment: 14 Active Inactive Nutrition Nursing Diagnoses: Impaired glucose control: actual or potential Potential for alteratiion in Nutrition/Potential for imbalanced nutrition Goals: Patient/caregiver will maintain therapeutic glucose control Date Initiated: 08/15/2019 Target Resolution Date: 11/30/2019 Goal Status: Active Interventions: Assess HgA1c results as ordered upon admission and as needed Assess patient nutrition upon admission and as needed per policy Treatment Activities: Patient referred to Primary Care Physician for further nutritional evaluation : 08/15/2019 Notes: Tissue Oxygenation Nursing Diagnoses: Actual ineffective tissue perfusion; peripheral (select once diagnosis is confirmed) Knowledge deficit related to disease process and management Goals: Patient/caregiver will verbalize understanding of disease process and disease management Date Initiated: 08/15/2019 Target Resolution Date: 11/30/2019 Goal Status: Active Interventions: Assess patient understanding of disease process and management upon diagnosis and as needed Assess peripheral arterial status upon admission and as needed Provide education on tissue oxygenation and ischemia Treatment Activities: T ordered outside of clinic : 08/15/2019 est Notes: Wound/Skin Impairment Nursing Diagnoses: Impaired tissue integrity Knowledge deficit related to smoking impact on wound healing Knowledge deficit related to ulceration/compromised skin integrity Goals: Patient will demonstrate a reduced rate of smoking or cessation of smoking Date Initiated: 08/15/2019 Target Resolution Date: 11/30/2019 Goal Status: Active Patient/caregiver will verbalize understanding of skin care regimen Date Initiated: 08/15/2019 Target Resolution Date: 11/30/2019 Goal Status: Active Ulcer/skin breakdown will have a volume reduction of 30% by week  4 Date Initiated: 08/15/2019 Date Inactivated: 09/21/2019 Target Resolution Date: 09/12/2019 Goal Status: Met Interventions: Assess patient/caregiver ability to obtain necessary supplies Assess patient/caregiver ability to perform ulcer/skin care regimen upon admission and as needed Assess ulceration(s) every visit Provide education on ulcer and skin care Treatment Activities: Skin care regimen initiated : 08/15/2019 Topical wound management initiated : 08/15/2019 Notes: Electronic Signature(s) Signed: 11/23/2019 4:23:42 PM By: Baruch Gouty RN, BSN Entered By: Baruch Gouty on 11/23/2019 10:21:57 -------------------------------------------------------------------------------- Pain Assessment Details Patient Name: Date of Service: [name]. 11/23/2019 9:15 A M Medical Record Number: [id number] Patient Account Number: [id number] Date of Birth/Sex: Treating RN: 03/18/1960 (59 y.o. [name] Primary Care Eduard Penkala: [name] Other Clinician: Referring Markeia Harkless: Treating Nupur Hohman/Extender: Domenic Polite Weeks in Treatment: 14 Active Problems Location of Pain Severity and Description of Pain Patient Has Paino No Site Locations Pain Management and Medication Current Pain Management: Electronic Signature(s) Signed: 11/23/2019 4:26:07 PM By: Deon Pilling Entered By: Deon Pilling on 11/23/2019 09:11:53 -------------------------------------------------------------------------------- Patient/Caregiver Education Details Patient Name:  Date of Service: [name] 10/29/2021andnbsp9:15 A M Medical Record Number: [id number] Patient Account Number: [id number] Date of Birth/Gender: Treating RN: July 05, 1960 (59 y.o. [name] Primary Care Physician: [name] Other Clinician: Referring Physician: Treating Physician/Extender: [name] in Treatment: 14 Education Assessment Education  Provided To: Patient Education Topics Provided Tissue Oxygenation: Methods: Explain/Verbal Responses: Reinforcements needed, State content correctly Wound/Skin Impairment: Handouts: Smoking and Wound Healing Methods: Explain/Verbal Responses: Reinforcements needed, State content correctly Electronic Signature(s) Signed: 11/23/2019 4:23:42 PM By: Baruch Gouty RN, BSN Entered By: Baruch Gouty on 11/23/2019 10:22:18 -------------------------------------------------------------------------------- Wound Assessment Details Patient Name: Date of Service: [name]. 11/23/2019 9:15 A M Medical Record Number: [id number] Patient Account Number: [id number] Date of Birth/Sex: Treating RN: [birth year] (59 y.o. Lorette Ang, Meta.Reding Primary Care Deltha Bernales: [name] Other Clinician: Referring Pink Maye: Treating Saivion Goettel/Extender: Domenic Polite Weeks in Treatment: 14 Wound Status Wound Number: 1 Primary Diabetic Wound/Ulcer of the Lower Extremity Etiology: Wound Location: Right, Dorsal Foot Secondary Arterial Insufficiency Ulcer Wounding Event: Gradually Appeared Etiology: Date Acquired: 03/26/2019 Wound Status: Open Weeks Of Treatment: 14 Comorbid Hypertension, Peripheral Arterial Disease, Type II Diabetes, Clustered Wound: No History: Neuropathy Wound Measurements Length: (cm) 1.5 Width: (cm) 1.5 Depth: (cm) 0.3 Area: (cm) 1.767 Volume: (cm) 0.53 % Reduction in Area: 37.5% % Reduction in Volume: -87.3% Epithelialization: Small (1-33%) Tunneling: No Undermining: No Wound Description Classification: Grade 2 Wound Margin: Flat and Intact Exudate Amount: Medium Exudate Type: Serosanguineous Exudate Color: red, brown Foul Odor After Cleansing: No Slough/Fibrino Yes Wound Bed Granulation Amount: Medium (34-66%) Exposed Structure Granulation Quality: Red, Pink Fascia Exposed: No Necrotic Amount: Medium (34-66%) Fat Layer (Subcutaneous  Tissue) Exposed: Yes Necrotic Quality: Adherent Slough Tendon Exposed: Yes Muscle Exposed: No Joint Exposed: No Bone Exposed: No Treatment Notes Wound #1 (Right, Dorsal Foot) 1. Cleanse With Wound Cleanser 2. Periwound Care Skin Prep 3. Primary Dressing Applied Collegen AG Hydrogel or K-Y Jelly 4. Secondary Dressing Foam Border Dressing 5. Secured With Office manager) Signed: 11/23/2019 4:26:07 PM By: Deon Pilling Entered By: Deon Pilling on 11/23/2019 09:15:47 -------------------------------------------------------------------------------- Vitals Details Patient Name: Date of Service: [name], [name] 11/23/2019 9:15 A M Medical Record Number: [id number] Patient Account Number: [id number] Date of Birth/Sex: Treating RN: 1961/01/23 (59 y.o. [name] Primary Care Moncerrath Berhe: [name] Other Clinician: Referring Jennfer Gassen: Treating Matther Labell/Extender: [name] in Treatment: 14 Vital Signs Time Taken: 09:10 Temperature (F): 99.1 Height (in): 62 Pulse (bpm): 99 Weight (lbs): 130 Respiratory Rate (breaths/min): 18 Body Mass Index (BMI): 23.8 Blood Pressure (mmHg): 110/73 Reference Range: 80 - 120 mg / dl Electronic Signature(s) Signed: 11/23/2019 4:26:07 PM By: Deon Pilling Entered By: Deon Pilling on 11/23/2019 09:11:48

## 2019-11-26 NOTE — Progress Notes (Signed)
ARAEL, PICCIONE ([id number]) Visit Report for 11/23/2019 HPI Details Patient Name: Date of Service: [name] 11/23/2019 9:15 A M Medical Record Number: [id number] Patient Account Number: [id number] Date of Birth/Sex: Treating RN: 08-29-1960 (59 y.o. [name] Primary Care Provider: [name] Other Clinician: Referring Provider: Treating Provider/Extender: [name] in Treatment: 14 History of Present Illness HPI Description: 08/15/2019 upon evaluation today patient presents for evaluation here in our clinic concerning issues that he is having with wounds on the dorsal surface of his right foot. This is something that was noted to occur after he was obtaining cortisone injections with [facility]. Subsequently he developed ulcerations he was then referred to Dr. Alvester Chou who performed arterial testing and subsequently found that the patient had poor arterial flow with an ABI of 0.6 and a TBI of 0.26. With that being said he has since on 07/16/2019 had an angiogram on the right with improvement of his ABI to 0.85 and TBI to 0.5. Obviously this is not perfect but is definitely far superior to where it was previous. He does have some necrotic tissue on the surface of the wounds is going to need debriding away he has been using Silvadene on this but to be honest that is not can to do anything for these wounds. He needs something more to clear this away sharp debridement is ideal and we may even look towards Santyl as well. The patient does have a history of diabetes noted in his chart though is not on medication and his A1c was around 6.4 so is not terrible. With that being said he does have known peripheral vascular disease he is actually have an angiogram on the left tomorrow. He also has hypertension and he does smoke. 8/27; this is a patient has been here once before about 6 weeks ago. Since then he was hospitalized from 7/22 through  7/23 by Dr. Gwenlyn Found for an attempt at revascularization. He had previously been seen by Dr. Amalia Hailey of podiatry. He is a continued tobacco smoker. His angiogram on 6/21 via revealed a occluded SFAs bilaterally with one-vessel runoff. He had a atherectomy followed by a drug-coated balloon angioplasty of the right SFA. 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 He had some improvement in his right ABI from 0.6-0.85. There is also an attempt to open his left SFA but that was unsuccessful. He does not have a wound on the left side. We are using Santyl on the patient's wounds on the right dorsal foot. He says he has about a 1 minute exercise tolerance before stopping because of pain compatible with claudication 9/10; we are using Santyl on the wound on the right dorsal foot. Still has significant claudication symptoms. He is status post angioplasty of the right SFA and atherectomy. He has one-vessel runoff via a diseased peroneal. He follows up with Dr. Gwenlyn Found on 9/14 9/17; I had my notes from last week that the patient was to see Dr. Gwenlyn Found on 9/14 he tells me that the appointment is actually on 9/21 although I do not see that in epic. The next appointment I see with Dr. Gwenlyn Found is on 11/17. Nevertheless the patient is fairly adamant that his appointment is early next week I told him to call ahead before he goes. The wound is a lot better we have been using Santyl over that he just ran out of that all represcribe it. 10/1; patient  arrives today with nothing on his wound. He says he has been using the Santyl but he feels the border foam is too painful. We told him he needs to put a covering on this of some sort perhaps gauze. Indeed after our discussion last time his appointment with Dr. Gwenlyn Found 11/17. Previously he has had a atherectomy and an angioplasty of the right SFA. I note in my previous notes it was said he had bilateral one-vessel runoff I'll need to review the tibial vessels on  the angiogram. The patient is currently rating his pain at 8 out of 10 He followed up with Dr. Amalia Hailey at [facility] on 9/27. He emphasized to follow-up with vascular and [place]. Follow-up with them as needed. 10/15; patient ran out of Santyl and has been using Silvadene. He still has constant pain which I think is largely claudication. I have looked over his angiogram results. I need to communicate with Dr. Gwenlyn Found to see if anything else can be done here. He arrives in clinic today with tendon over most of the wound surface area 10/29; we are using silver collagen on the right foot. Still complaining of a lot of pain especially at night. Dr. Gwenlyn Found is out of town this week I will send him an email but he has a follow-up appointment on 11/17. Electronic Signature(s) Signed: 11/26/2019 2:10:44 PM By: Linton Ham MD Entered By: Linton Ham on 11/26/2019 08:05:28 -------------------------------------------------------------------------------- Physical Exam Details Patient Name: Date of Service: [name] 11/23/2019 9:15 A M Medical Record Number: [id number] Patient Account Number: [id number] Date of Birth/Sex: Treating RN: [birth year] (59 y.o. [name] Primary Care Provider: [name] Other Clinician: Referring Provider: Treating Provider/Extender: Domenic Polite Weeks in Treatment: 14 Cardiovascular Popliteal pulses not palpable either. There is no palpable dorsalis pedis or posterior tibial pulse. Notes 10/29; dorsal right foot wound. I think this has mostly tendon. No debridement. The small area approximately I think is closed over Electronic Signature(s) Signed: 11/26/2019 2:10:44 PM By: Linton Ham MD Entered By: Linton Ham on 11/23/2019 10:43:08 -------------------------------------------------------------------------------- Physician Orders Details Patient Name: Date of Service: [name]. 11/23/2019 9:15 A  M Medical Record Number: [id number] Patient Account Number: [id number] Date of Birth/Sex: Treating RN: 12-03-1960 (59 y.o. [name] Primary Care Provider: [name] Other Clinician: Referring Provider: Treating Provider/Extender: [name] in Treatment: 14 Verbal / Phone Orders: No Diagnosis Coding ICD-10 Coding Code Description I73.89 Other specified peripheral vascular diseases E11.621 Type 2 diabetes mellitus with foot ulcer L97.512 Non-pressure chronic ulcer of other part of right foot with fat layer exposed I10 Essential (primary) hypertension F17.218 Nicotine dependence, cigarettes, with other nicotine-induced disorders Follow-up Appointments Return Appointment in 1 week. Dressing Change Frequency Wound #1 Right,Dorsal Foot Change Dressing every other day. Wound Cleansing Wound #1 Right,Dorsal Foot May shower and wash wound with soap and water. Primary Wound Dressing Wound #1 Right,Dorsal Foot Silver Collagen - moisten with hydrogel or KY gel Secondary Dressing Wound #1 Right,Dorsal Foot Foam Border Off-Loading Open toe surgical shoe to: Additional Orders / Instructions Stop/Decrease Smoking Electronic Signature(s) Signed: 11/23/2019 4:23:42 PM By: Baruch Gouty RN, BSN Signed: 11/26/2019 2:10:44 PM By: Linton Ham MD Entered By: Baruch Gouty on 11/23/2019 10:25:34 -------------------------------------------------------------------------------- Problem List Details Patient Name: Date of Service: [name]. 11/23/2019 9:15 A M Medical Record Number: [id number] Patient Account Number: [id number] Date of Birth/Sex: Treating RN: Aug 11, 1960 (59 y.o.  [name] Primary Care Provider: [name] Other Clinician: Referring Provider: Treating Provider/Extender: Domenic Polite Weeks in Treatment: 14 Active Problems ICD-10 Encounter Code Description Active Date  MDM Diagnosis I73.89 Other specified peripheral vascular diseases 08/15/2019 No Yes E11.621 Type 2 diabetes mellitus with foot ulcer 08/15/2019 No Yes L97.512 Non-pressure chronic ulcer of other part of right foot with fat layer exposed 08/15/2019 No Yes I10 Essential (primary) hypertension 08/15/2019 No Yes F17.218 Nicotine dependence, cigarettes, with other nicotine-induced disorders 08/15/2019 No Yes Inactive Problems Resolved Problems Electronic Signature(s) Signed: 11/26/2019 2:10:44 PM By: Linton Ham MD Entered By: Linton Ham on 11/23/2019 10:40:06 -------------------------------------------------------------------------------- Progress Note Details Patient Name: Date of Service: [name] 11/23/2019 9:15 A M Medical Record Number: [id number] Patient Account Number: [id number] Date of Birth/Sex: Treating RN: May 13, 1960 (59 y.o. [name] Primary Care Provider: [name] Other Clinician: Referring Provider: Treating Provider/Extender: Domenic Polite Weeks in Treatment: 14 Subjective History of Present Illness (HPI) 08/15/2019 upon evaluation today patient presents for evaluation here in our clinic concerning issues that he is having with wounds on the dorsal surface of his right foot. This is something that was noted to occur after he was obtaining cortisone injections with [facility]. Subsequently he developed ulcerations he was then referred to Dr. Alvester Chou who performed arterial testing and subsequently found that the patient had poor arterial flow with an ABI of 0.6 and a TBI of 0.26. With that being said he has since on 07/16/2019 had an angiogram on the right with improvement of his ABI to 0.85 and TBI to 0.5. Obviously this is not perfect but is definitely far superior to where it was previous. He does have some necrotic tissue on the surface of the wounds is going to need debriding away he has been using Silvadene on this  but to be honest that is not can to do anything for these wounds. He needs something more to clear this away sharp debridement is ideal and we may even look towards Santyl as well. The patient does have a history of diabetes noted in his chart though is not on medication and his A1c was around 6.4 so is not terrible. With that being said he does have known peripheral vascular disease he is actually have an angiogram on the left tomorrow. He also has hypertension and he does smoke. 8/27; this is a patient has been here once before about 6 weeks ago. Since then he was hospitalized from 7/22 through 7/23 by Dr. Gwenlyn Found for an attempt at revascularization. He had previously been seen by Dr. Amalia Hailey of podiatry. He is a continued tobacco smoker. His angiogram on 6/21 via revealed a occluded SFAs bilaterally with one-vessel runoff. He had a atherectomy followed by a drug-coated balloon angioplasty of the right SFA. 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 He had some improvement in his right ABI from 0.6-0.85. There is also an attempt to open his left SFA but that was unsuccessful. He does not have a wound on the left side. We are using Santyl on the patient's wounds on the right dorsal foot. He says he has about a 1 minute exercise tolerance before stopping because of pain compatible with claudication 9/10; we are using Santyl on the wound on the right dorsal foot. Still has significant claudication symptoms. He is status post angioplasty of the right SFA and atherectomy. He has one-vessel runoff  via a diseased peroneal. He follows up with Dr. Gwenlyn Found on 9/14 9/17; I had my notes from last week that the patient was to see Dr. Gwenlyn Found on 9/14 he tells me that the appointment is actually on 9/21 although I do not see that in epic. The next appointment I see with Dr. Gwenlyn Found is on 11/17. Nevertheless the patient is fairly adamant that his appointment is early next week I told him to call  ahead before he goes. The wound is a lot better we have been using Santyl over that he just ran out of that all represcribe it. 10/1; patient arrives today with nothing on his wound. He says he has been using the Santyl but he feels the border foam is too painful. We told him he needs to put a covering on this of some sort perhaps gauze. Indeed after our discussion last time his appointment with Dr. Gwenlyn Found 11/17. Previously he has had a atherectomy and an angioplasty of the right SFA. I note in my previous notes it was said he had bilateral one-vessel runoff I'll need to review the tibial vessels on the angiogram. The patient is currently rating his pain at 8 out of 10 He followed up with Dr. Amalia Hailey at [facility] on 9/27. He emphasized to follow-up with vascular and [place]. Follow-up with them as needed. 10/15; patient ran out of Santyl and has been using Silvadene. He still has constant pain which I think is largely claudication. I have looked over his angiogram results. I need to communicate with Dr. Gwenlyn Found to see if anything else can be done here. He arrives in clinic today with tendon over most of the wound surface area 10/29; we are using silver collagen on the right foot. Still complaining of a lot of pain especially at night. Dr. Gwenlyn Found is out of town this week I will send him an email but he has a follow-up appointment on 11/17. Objective Constitutional Vitals Time Taken: 9:10 AM, Height: 62 in, Weight: 130 lbs, BMI: 23.8, Temperature: 99.1 F, Pulse: 99 bpm, Respiratory Rate: 18 breaths/min, Blood Pressure: 110/73 mmHg. Cardiovascular Popliteal pulses not palpable either. There is no palpable dorsalis pedis or posterior tibial pulse. General Notes: 10/29; dorsal right foot wound. I think this has mostly tendon. No debridement. The small area approximately I think is closed over Integumentary (Hair, Skin) Wound #1 status is Open. Original cause of wound was Gradually Appeared. The wound  is located on the Right,Dorsal Foot. The wound measures 1.5cm length x 1.5cm width x 0.3cm depth; 1.767cm^2 area and 0.53cm^3 volume. There is tendon and Fat Layer (Subcutaneous Tissue) exposed. There is no tunneling or undermining noted. There is a medium amount of serosanguineous drainage noted. The wound margin is flat and intact. There is medium (34-66%) red, pink granulation within the wound bed. There is a medium (34-66%) amount of necrotic tissue within the wound bed including Adherent Slough. Assessment Active Problems ICD-10 Other specified peripheral vascular diseases Type 2 diabetes mellitus with foot ulcer Non-pressure chronic ulcer of other part of right foot with fat layer exposed Essential (primary) hypertension Nicotine dependence, cigarettes, with other nicotine-induced disorders Plan Follow-up Appointments: Return Appointment in 1 week. Dressing Change Frequency: Wound #1 Right,Dorsal Foot: Change Dressing every other day. Wound Cleansing: Wound #1 Right,Dorsal Foot: May shower and wash wound with soap and water. Primary Wound Dressing: Wound #1 Right,Dorsal Foot: Silver Collagen - moisten with hydrogel or KY gel Secondary Dressing: Wound #1 Right,Dorsal Foot: Foam Border  Off-Loading: Open toe surgical shoe to: Additional Orders / Instructions: Stop/Decrease Smoking 1 at this point I am continuing with silver collagen moistened with hydrogel and a border foam. I do not think there is any hope of healing this unless we can get more blood flow. 2. Still smoking "2 cigarettes a day" 3. I will send a message to Dr. Gwenlyn Found seeing if anything else can be done especially with the anterior tibial artery ADDENDUM; 11/26/19 Secure T to Dr. Gwenlyn Found. He has f/u on 11/17 per EPIC ext Electronic Signature(s) Signed: 11/26/2019 8:06:55 AM By: Linton Ham MD Entered By: Linton Ham on 11/26/2019  08:06:55 -------------------------------------------------------------------------------- SuperBill Details Patient Name: Date of Service: [name], [name] 11/23/2019 Medical Record Number: [id number] Patient Account Number: [id number] Date of Birth/Sex: Treating RN: 19-Nov-1960 (59 y.o. [name] Primary Care Provider: [name] Other Clinician: Referring Provider: Treating Provider/Extender: Domenic Polite Weeks in Treatment: 14 Diagnosis Coding ICD-10 Codes Code Description I73.89 Other specified peripheral vascular diseases E11.621 Type 2 diabetes mellitus with foot ulcer L97.512 Non-pressure chronic ulcer of other part of right foot with fat layer exposed I10 Essential (primary) hypertension F17.218 Nicotine dependence, cigarettes, with other nicotine-induced disorders Facility Procedures CPT4 Code: [id number] Description: 99214 - WOUND CARE VISIT-LEV 4 EST PT Modifier: Quantity: 1 Physician Procedures : CPT4 Code Description Modifier [id number] - WC PHYS LEVEL 3 - EST PT ICD-10 Diagnosis Description E11.621 Type 2 diabetes mellitus with foot ulcer L97.512 Non-pressure chronic ulcer of other part of right foot with fat layer exposed Quantity: 1 Electronic Signature(s) Signed: 11/26/2019 2:10:44 PM By: Linton Ham MD Entered By: Linton Ham on 11/23/2019 10:44:35

## 2019-11-30 ENCOUNTER — Encounter (HOSPITAL_BASED_OUTPATIENT_CLINIC_OR_DEPARTMENT_OTHER): Payer: Medicare Other | Attending: Internal Medicine | Admitting: Internal Medicine

## 2019-11-30 DIAGNOSIS — E1140 Type 2 diabetes mellitus with diabetic neuropathy, unspecified: Secondary | ICD-10-CM | POA: Insufficient documentation

## 2019-11-30 DIAGNOSIS — L97512 Non-pressure chronic ulcer of other part of right foot with fat layer exposed: Secondary | ICD-10-CM | POA: Insufficient documentation

## 2019-11-30 DIAGNOSIS — I10 Essential (primary) hypertension: Secondary | ICD-10-CM | POA: Insufficient documentation

## 2019-11-30 DIAGNOSIS — E1151 Type 2 diabetes mellitus with diabetic peripheral angiopathy without gangrene: Secondary | ICD-10-CM | POA: Insufficient documentation

## 2019-11-30 DIAGNOSIS — E11621 Type 2 diabetes mellitus with foot ulcer: Secondary | ICD-10-CM | POA: Insufficient documentation

## 2019-11-30 DIAGNOSIS — F17218 Nicotine dependence, cigarettes, with other nicotine-induced disorders: Secondary | ICD-10-CM | POA: Insufficient documentation

## 2019-12-07 ENCOUNTER — Encounter (HOSPITAL_BASED_OUTPATIENT_CLINIC_OR_DEPARTMENT_OTHER): Payer: Medicare Other | Admitting: Internal Medicine

## 2019-12-12 ENCOUNTER — Ambulatory Visit (INDEPENDENT_AMBULATORY_CARE_PROVIDER_SITE_OTHER): Payer: Medicare Other | Admitting: Cardiovascular Disease

## 2019-12-12 ENCOUNTER — Encounter: Payer: Self-pay | Admitting: Cardiovascular Disease

## 2019-12-12 ENCOUNTER — Other Ambulatory Visit: Payer: Self-pay

## 2019-12-12 VITALS — BP 114/66 | HR 85 | Ht 62.0 in | Wt 108.0 lb

## 2019-12-12 DIAGNOSIS — I10 Essential (primary) hypertension: Secondary | ICD-10-CM

## 2019-12-12 DIAGNOSIS — E7800 Pure hypercholesterolemia, unspecified: Secondary | ICD-10-CM | POA: Diagnosis not present

## 2019-12-12 DIAGNOSIS — F17200 Nicotine dependence, unspecified, uncomplicated: Secondary | ICD-10-CM | POA: Diagnosis not present

## 2019-12-12 DIAGNOSIS — I70229 Atherosclerosis of native arteries of extremities with rest pain, unspecified extremity: Secondary | ICD-10-CM | POA: Diagnosis not present

## 2019-12-12 LAB — HEPATIC FUNCTION PANEL
ALT: 21 IU/L (ref 0–44)
AST: 13 IU/L (ref 0–40)
Albumin: 4.4 g/dL (ref 3.8–4.9)
Alkaline Phosphatase: 88 IU/L (ref 44–121)
Bilirubin Total: 0.6 mg/dL (ref 0.0–1.2)
Bilirubin, Direct: 0.16 mg/dL (ref 0.00–0.40)
Total Protein: 7.4 g/dL (ref 6.0–8.5)

## 2019-12-12 LAB — CBC
Hematocrit: 36.2 % — ABNORMAL LOW (ref 37.5–51.0)
Hemoglobin: 11.7 g/dL — ABNORMAL LOW (ref 13.0–17.7)
MCH: 29 pg (ref 26.6–33.0)
MCHC: 32.3 g/dL (ref 31.5–35.7)
MCV: 90 fL (ref 79–97)
Platelets: 249 10*3/uL (ref 150–450)
RBC: 4.03 x10E6/uL — ABNORMAL LOW (ref 4.14–5.80)
RDW: 14.5 % (ref 11.6–15.4)
WBC: 6.2 10*3/uL (ref 3.4–10.8)

## 2019-12-12 LAB — LIPID PANEL
Chol/HDL Ratio: 3.2 ratio (ref 0.0–5.0)
Cholesterol, Total: 144 mg/dL (ref 100–199)
HDL: 45 mg/dL (ref >39)
LDL Chol Calc (NIH): 87 mg/dL (ref 0–99)
Triglycerides: 57 mg/dL (ref 0–149)
VLDL Cholesterol Cal: 12 mg/dL (ref 5–40)

## 2019-12-12 LAB — BASIC METABOLIC PANEL
BUN/Creatinine Ratio: 16 (ref 10–24)
BUN: 18 mg/dL (ref 8–27)
CO2: 25 mmol/L (ref 20–29)
Calcium: 9.9 mg/dL (ref 8.6–10.2)
Chloride: 98 mmol/L (ref 96–106)
Creatinine, Ser: 1.12 mg/dL (ref 0.76–1.27)
GFR calc Af Amer: 82 mL/min/{1.73_m2} (ref >59)
GFR calc non Af Amer: 71 mL/min/{1.73_m2} (ref >59)
Glucose: 114 mg/dL — ABNORMAL HIGH (ref 65–99)
Potassium: 3.8 mmol/L (ref 3.5–5.2)
Sodium: 136 mmol/L (ref 134–144)

## 2019-12-12 MED ORDER — SODIUM CHLORIDE 0.9% FLUSH: 3.0000 mL | Freq: Two times a day (BID) | INTRAVENOUS | Status: AC

## 2019-12-12 NOTE — Patient Instructions (Signed)
    [name] Dept: [phone] Loc: [place].  12/12/2019  You are scheduled for a Peripheral Angiogram on Wednesday, November 24 with Dr. Kathlyn Sacramento.  1. Please arrive at the [facility] (Main Entrance A) at [facility]: [address] [place], [place] [address] at 10:30 AM (This time is two hours before your procedure to ensure your preparation). Free valet parking service is available.   Special note: Every effort is made to have your procedure done on time. Please understand that emergencies sometimes delay scheduled procedures.  2. Diet: Do not eat solid foods after midnight.  The patient may have clear liquids until 5am upon the day of the procedure.  3. Labs: You will be done today  4. Medication instructions in preparation for your procedure:   On the morning of your procedure, take your Aspirin and Plavix/Clopidogrel and any morning medicines NOT listed above.  You may use sips of water.  5. Plan for one night stay--bring personal belongings. 6. Bring a current list of your medications and current insurance cards. 7. You MUST have a responsible person to drive you home. 8. Someone MUST be with you the first 24 hours after you arrive home or your discharge will be delayed. 9. Please wear clothes that are easy to get on and off and wear slip-on shoes.  Thank you for allowing [place] to care for you!   -- [facility]  You will need a COVID-19  test prior to your procedure. You are scheduled for Saturday, 11/20 at 9:25AM. This is a Drive Up Visit at [address] [place], [place] [address]. Someone will direct you to the appropriate testing line. Stay in your car and someone will be with you shortly.

## 2019-12-12 NOTE — H&P (View-Only) (Signed)
12/12/2019 [place].   01/20/1961  [id number]  Primary Physician Loretha Brasil, FNP (Inactive) Primary Cardiologist: Lorretta Harp MD FACP, [place], [place], [place]  HPI:  [name]. is a 59 y.o.  thin and chronically ill-appearing separated African-American male with no children who does not work because of being disabled. Is referred by Dr. Lesia Sago me for evaluation treatment of critical limb ischemia.I last saw him in the office  09/11/2019. The patient was initially referred to our practice by Dr. Daylene Katayama, podiatry. His risk factors include 20 to 30 pack years of tobacco use having smoked for last 45 years, treated hypertension and hyperlipidemia. He is never had a heart attack or stroke. He denies chest pain or shortness of breath. He apparently had 3 cortisone shots in his right foot 4 to 5 months ago which resulted in wounds at the injection sites which have not healed. Also complains of claudication bilaterally. Recent Dopplers performed today revealed a right ABI of 0.60 and a left of 0.45 with occluded SFAs bilaterally. He does have CKD 3 with serum creatinine in the mid 1 range.  I performed peripheral angiography on him 07/16/2019 revealing occluded SFAs bilaterally with one-vessel runoff. I performed directional atherectomy followed by drug-coated balloon angioplasty of his right SFA. 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 His right ABI increased from 0.60-0.85. The wounds on his right foot are still painful and still have not yet to heal.  I attempted to open up his left SFA CTO 08/16/2019 was unsuccessful reentering the distal.  He also had a partial failure of his right common femoral Perclose.  He was kept overnight.  He still has claudication on the left.  He also continues to smoke.  Since I saw him 3 months ago he continues to smoke several cigarettes a day.  The wound on the dorsal aspect of his right  foot still has not healed despite aggressive local wound care and according to Dr. Dellia Nims, he suspects he needs additional blood flow in order to facilitate this.  He still complains of claudication in his left leg from his left SFA CTO which I was unsuccessful in crossing.  We will arrange for him to undergo attempt at tibial vessel intervention next week.   Current Meds  Medication Sig  . aspirin EC 81 MG EC tablet Take 1 tablet (81 mg total) by mouth daily. Swallow whole.  Marland Kitchen atorvastatin (LIPITOR) 80 MG tablet Take 1 tablet (80 mg total) by mouth daily.  . Blood Pressure Monitor KIT Check blood pressure daily  . carboxymethylcellulose (LUBRICANT EYE DROPS) 0.5 % SOLN Place 1 drop into both eyes 3 (three) times daily as needed.  . clopidogrel (PLAVIX) 75 MG tablet Take 1 tablet (75 mg total) by mouth daily with breakfast.  . doxycycline (VIBRA-TABS) 100 MG tablet Take 100 mg by mouth 2 (two) times daily.   Marland Kitchen gabapentin (NEURONTIN) 100 MG capsule Take 200 mg by mouth 3 (three) times daily.  Marland Kitchen lisinopril-hydrochlorothiazide (ZESTORETIC) 20-25 MG tablet Take 1 tablet by mouth daily.  Marland Kitchen SANTYL ointment Apply topically.  Marland Kitchen SSD 1 % cream Apply topically.  . traMADol (ULTRAM) 50 MG tablet Take 50 mg by mouth 3 (three) times daily as needed.  . varenicline (CHANTIX STARTING MONTH PAK) 0.5 MG X 11 & 1 MG X 42 tablet Take one 0.5 mg tablet by mouth once daily for 3 days, then increase to  one 0.5 mg tablet twice daily for 4 days, then increase to one 1 mg tablet twice daily. (Patient taking differently: Take 1 mg by mouth 2 (two) times daily. one 1 mg tablet twice daily.)     No Known Allergies  Social History   Socioeconomic History  . Marital status: Legally Separated    Spouse name: Not on file  . Number of children: Not on file  . Years of education: Not on file  . Highest education level: Not on file  Occupational History  . Not on file  Tobacco Use  . Smoking status: Current Every Day  Smoker    Packs/day: 0.50    Years: 40.00    Pack years: 20.00    Types: Cigarettes  . Smokeless tobacco: Never Used  Vaping Use  . Vaping Use: Never used  Substance and Sexual Activity  . Alcohol use: Yes    Comment: beer and liquor daily  . Drug use: Yes    Frequency: 3.0 times per week    Types: Marijuana  . Sexual activity: Not on file  Other Topics Concern  . Not on file  Social History Narrative  . Not on file   Social Determinants of Health   Financial Resource Strain:   . Difficulty of Paying Living Expenses: Not on file  Food Insecurity:   . Worried About Charity fundraiser in the Last Year: Not on file  . Ran Out of Food in the Last Year: Not on file  Transportation Needs:   . Lack of Transportation (Medical): Not on file  . Lack of Transportation (Non-Medical): Not on file  Physical Activity:   . Days of Exercise per Week: Not on file  . Minutes of Exercise per Session: Not on file  Stress:   . Feeling of Stress : Not on file  Social Connections:   . Frequency of Communication with Friends and Family: Not on file  . Frequency of Social Gatherings with Friends and Family: Not on file  . Attends Religious Services: Not on file  . Active Member of Clubs or Organizations: Not on file  . Attends Archivist Meetings: Not on file  . Marital Status: Not on file  Intimate Partner Violence:   . Fear of Current or Ex-Partner: Not on file  . Emotionally Abused: Not on file  . Physically Abused: Not on file  . Sexually Abused: Not on file     Review of Systems: General: negative for chills, fever, night sweats or weight changes.  Cardiovascular: negative for chest pain, dyspnea on exertion, edema, orthopnea, palpitations, paroxysmal nocturnal dyspnea or shortness of breath Dermatological: negative for rash Respiratory: negative for cough or wheezing Urologic: negative for hematuria Abdominal: negative for nausea, vomiting, diarrhea, bright red blood per  rectum, melena, or hematemesis Neurologic: negative for visual changes, syncope, or dizziness All other systems reviewed and are otherwise negative except as noted above.    Blood pressure 114/66, pulse 85, height _0  (1.575 m), weight 108 lb (49 kg).  General appearance: alert and no distress Neck: no adenopathy, no carotid bruit, no JVD, supple, symmetrical, trachea midline and thyroid not enlarged, symmetric, no tenderness/mass/nodules Lungs: clear to auscultation bilaterally Heart: regular rate and rhythm, S1, S2 normal, no murmur, click, rub or gallop Extremities: extremities normal, atraumatic, no cyanosis or edema Pulses: Diminished pedal pulses bilaterally Skin: Shallow ischemic ulcer dorsum right foot Neurologic: Alert and oriented X 3, normal strength and tone. Normal symmetric reflexes. Normal  coordination and gait  EKG sinus rhythm 85 without ST or T wave changes.  I personally reviewed this EKG.  ASSESSMENT AND PLAN:   Tobacco use disorder Continues to smoke 3 cigarettes a day  Critical ischemia of foot Mr. Castille returns today for follow-up.  He has a nonhealing wound on the dorsum of his right foot.  I did perform directional atherectomy, drug-coated balloon angioplasty of a right SFA CTO back in June.  He has one-vessel runoff via peroneal.  Despite aggressive local wound care his wound still has yet to heal and I suspect he needs an additional patent vessel to the foot.  We will arrange for him to have attempt at anterior tibial/posterior tibial revascularization.      Lorretta Harp MD FACP,FACC,FAHA, [facility] 12/12/2019 9:27 AM

## 2019-12-12 NOTE — Addendum Note (Signed)
Addended by: Beatrix Fetters on: 12/12/2019 01:02 PM   Modules accepted: Orders, SmartSet

## 2019-12-12 NOTE — Progress Notes (Signed)
   12/12/2019 Ruben J Gorman Jr.   07/11/1960  [id number]  Primary Physician Daye, Deneda T, FNP (Inactive) Primary Cardiologist: Charles Niese J Riverlyn Kizziah MD FACP, FACC, FAHA, FSCAI  HPI:  Ruben J Ratti Jr. is a 59 y.o.  thin and chronically ill-appearing separated African-American male with no children who does not work because of being disabled. Is referred by Dr. O'Nealto me for evaluation treatment of critical limb ischemia.I last saw him in the office  09/11/2019. The patient was initially referred to our practice by Dr. Brent Evans, podiatry. His risk factors include 20 to 30 pack years of tobacco use having smoked for last 45 years, treated hypertension and hyperlipidemia. He is never had a heart attack or stroke. He denies chest pain or shortness of breath. He apparently had 3 cortisone shots in his right foot 4 to 5 months ago which resulted in wounds at the injection sites which have not healed. Also complains of claudication bilaterally. Recent Dopplers performed today revealed a right ABI of 0.60 and a left of 0.45 with occluded SFAs bilaterally. He does have CKD 3 with serum creatinine in the mid 1 range.  I performed peripheral angiography on him 07/16/2019 revealing occluded SFAs bilaterally with one-vessel runoff. I performed directional atherectomy followed by drug-coated balloon angioplasty of his right SFA. 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 His right ABI increased from 0.60-0.85. The wounds on his right foot are still painful and still have not yet to heal.  I attempted to open up his left SFA CTO 08/16/2019 was unsuccessful reentering the distal.  He also had a partial failure of his right common femoral Perclose.  He was kept overnight.  He still has claudication on the left.  He also continues to smoke.  Since I saw him 3 months ago he continues to smoke several cigarettes a day.  The wound on the dorsal aspect of his right  foot still has not healed despite aggressive local wound care and according to Dr. Robson, he suspects he needs additional blood flow in order to facilitate this.  He still complains of claudication in his left leg from his left SFA CTO which I was unsuccessful in crossing.  We will arrange for him to undergo attempt at tibial vessel intervention next week.   Current Meds  Medication Sig  . aspirin EC 81 MG EC tablet Take 1 tablet (81 mg total) by mouth daily. Swallow whole.  . atorvastatin (LIPITOR) 80 MG tablet Take 1 tablet (80 mg total) by mouth daily.  . Blood Pressure Monitor KIT Check blood pressure daily  . carboxymethylcellulose (LUBRICANT EYE DROPS) 0.5 % SOLN Place 1 drop into both eyes 3 (three) times daily as needed.  . clopidogrel (PLAVIX) 75 MG tablet Take 1 tablet (75 mg total) by mouth daily with breakfast.  . doxycycline (VIBRA-TABS) 100 MG tablet Take 100 mg by mouth 2 (two) times daily.   . gabapentin (NEURONTIN) 100 MG capsule Take 200 mg by mouth 3 (three) times daily.  . lisinopril-hydrochlorothiazide (ZESTORETIC) 20-25 MG tablet Take 1 tablet by mouth daily.  . SANTYL ointment Apply topically.  . SSD 1 % cream Apply topically.  . traMADol (ULTRAM) 50 MG tablet Take 50 mg by mouth 3 (three) times daily as needed.  . varenicline (CHANTIX STARTING MONTH PAK) 0.5 MG X 11 & 1 MG X 42 tablet Take one 0.5 mg tablet by mouth once daily for 3 days, then increase to   one 0.5 mg tablet twice daily for 4 days, then increase to one 1 mg tablet twice daily. (Patient taking differently: Take 1 mg by mouth 2 (two) times daily. one 1 mg tablet twice daily.)     No Known Allergies  Social History   Socioeconomic History  . Marital status: Legally Separated    Spouse name: Not on file  . Number of children: Not on file  . Years of education: Not on file  . Highest education level: Not on file  Occupational History  . Not on file  Tobacco Use  . Smoking status: Current Every Day  Smoker    Packs/day: 0.50    Years: 40.00    Pack years: 20.00    Types: Cigarettes  . Smokeless tobacco: Never Used  Vaping Use  . Vaping Use: Never used  Substance and Sexual Activity  . Alcohol use: Yes    Comment: beer and liquor daily  . Drug use: Yes    Frequency: 3.0 times per week    Types: Marijuana  . Sexual activity: Not on file  Other Topics Concern  . Not on file  Social History Narrative  . Not on file   Social Determinants of Health   Financial Resource Strain:   . Difficulty of Paying Living Expenses: Not on file  Food Insecurity:   . Worried About Running Out of Food in the Last Year: Not on file  . Ran Out of Food in the Last Year: Not on file  Transportation Needs:   . Lack of Transportation (Medical): Not on file  . Lack of Transportation (Non-Medical): Not on file  Physical Activity:   . Days of Exercise per Week: Not on file  . Minutes of Exercise per Session: Not on file  Stress:   . Feeling of Stress : Not on file  Social Connections:   . Frequency of Communication with Friends and Family: Not on file  . Frequency of Social Gatherings with Friends and Family: Not on file  . Attends Religious Services: Not on file  . Active Member of Clubs or Organizations: Not on file  . Attends Club or Organization Meetings: Not on file  . Marital Status: Not on file  Intimate Partner Violence:   . Fear of Current or Ex-Partner: Not on file  . Emotionally Abused: Not on file  . Physically Abused: Not on file  . Sexually Abused: Not on file     Review of Systems: General: negative for chills, fever, night sweats or weight changes.  Cardiovascular: negative for chest pain, dyspnea on exertion, edema, orthopnea, palpitations, paroxysmal nocturnal dyspnea or shortness of breath Dermatological: negative for rash Respiratory: negative for cough or wheezing Urologic: negative for hematuria Abdominal: negative for nausea, vomiting, diarrhea, bright red blood per  rectum, melena, or hematemesis Neurologic: negative for visual changes, syncope, or dizziness All other systems reviewed and are otherwise negative except as noted above.    Blood pressure 114/66, pulse 85, height 5' 2" (1.575 m), weight 108 lb (49 kg).  General appearance: alert and no distress Neck: no adenopathy, no carotid bruit, no JVD, supple, symmetrical, trachea midline and thyroid not enlarged, symmetric, no tenderness/mass/nodules Lungs: clear to auscultation bilaterally Heart: regular rate and rhythm, S1, S2 normal, no murmur, click, rub or gallop Extremities: extremities normal, atraumatic, no cyanosis or edema Pulses: Diminished pedal pulses bilaterally Skin: Shallow ischemic ulcer dorsum right foot Neurologic: Alert and oriented X 3, normal strength and tone. Normal symmetric reflexes. Normal   coordination and gait  EKG sinus rhythm 85 without ST or T wave changes.  I personally reviewed this EKG.  ASSESSMENT AND PLAN:   Tobacco use disorder Continues to smoke 3 cigarettes a day  Critical ischemia of foot [name] returns today for follow-up.  He has a nonhealing wound on the dorsum of his right foot.  I did perform directional atherectomy, drug-coated balloon angioplasty of a right SFA CTO back in June.  He has one-vessel runoff via peroneal.  Despite aggressive local wound care his wound still has yet to heal and I suspect he needs an additional patent vessel to the foot.  We will arrange for him to have attempt at anterior tibial/posterior tibial revascularization.      Paticia Moster J. Wilmary Levit MD FACP,FACC,FAHA, FSCAI 12/12/2019 9:27 AM 

## 2019-12-12 NOTE — Assessment & Plan Note (Addendum)
Continues to smoke 3 cigarettes a day

## 2019-12-12 NOTE — Assessment & Plan Note (Signed)
[name] returns today for follow-up.  He has a nonhealing wound on the dorsum of his right foot.  I did perform directional atherectomy, drug-coated balloon angioplasty of a right SFA CTO back in June.  He has one-vessel runoff via peroneal.  Despite aggressive local wound care his wound still has yet to heal and I suspect he needs an additional patent vessel to the foot.  We will arrange for him to have attempt at anterior tibial/posterior tibial revascularization.

## 2019-12-13 ENCOUNTER — Encounter (HOSPITAL_BASED_OUTPATIENT_CLINIC_OR_DEPARTMENT_OTHER): Payer: Medicare Other | Admitting: Internal Medicine

## 2019-12-13 DIAGNOSIS — E1151 Type 2 diabetes mellitus with diabetic peripheral angiopathy without gangrene: Secondary | ICD-10-CM | POA: Diagnosis not present

## 2019-12-13 DIAGNOSIS — I10 Essential (primary) hypertension: Secondary | ICD-10-CM | POA: Diagnosis not present

## 2019-12-13 DIAGNOSIS — F17218 Nicotine dependence, cigarettes, with other nicotine-induced disorders: Secondary | ICD-10-CM | POA: Diagnosis not present

## 2019-12-13 DIAGNOSIS — L97512 Non-pressure chronic ulcer of other part of right foot with fat layer exposed: Secondary | ICD-10-CM | POA: Diagnosis not present

## 2019-12-13 DIAGNOSIS — E1140 Type 2 diabetes mellitus with diabetic neuropathy, unspecified: Secondary | ICD-10-CM | POA: Diagnosis not present

## 2019-12-13 DIAGNOSIS — E11621 Type 2 diabetes mellitus with foot ulcer: Secondary | ICD-10-CM | POA: Diagnosis not present

## 2019-12-13 NOTE — Progress Notes (Signed)
DAMON, HARGROVE ([id number]) Visit Report for 12/13/2019 HPI Details Patient Name: Date of Service: [name] 12/13/2019 9:45 A M Medical Record Number: [id number] Patient Account Number: [id number] Date of Birth/Sex: Treating RN: November 11, 1960 (59 y.o. [name] Primary Care Provider: [name] Other Clinician: Referring Provider: Treating Provider/Extender: [name] in Treatment: 17 History of Present Illness HPI Description: 08/15/2019 upon evaluation today patient presents for evaluation here in our clinic concerning issues that he is having with wounds on the dorsal surface of his right foot. This is something that was noted to occur after he was obtaining cortisone injections with [facility]. Subsequently he developed ulcerations he was then referred to Dr. Alvester Chou who performed arterial testing and subsequently found that the patient had poor arterial flow with an ABI of 0.6 and a TBI of 0.26. With that being said he has since on 07/16/2019 had an angiogram on the right with improvement of his ABI to 0.85 and TBI to 0.5. Obviously this is not perfect but is definitely far superior to where it was previous. He does have some necrotic tissue on the surface of the wounds is going to need debriding away he has been using Silvadene on this but to be honest that is not can to do anything for these wounds. He needs something more to clear this away sharp debridement is ideal and we may even look towards Santyl as well. The patient does have a history of diabetes noted in his chart though is not on medication and his A1c was around 6.4 so is not terrible. With that being said he does have known peripheral vascular disease he is actually have an angiogram on the left tomorrow. He also has hypertension and he does smoke. 8/27; this is a patient has been here once before about 6 weeks ago. Since then he was hospitalized from 7/22 through 7/23  by Dr. Gwenlyn Found for an attempt at revascularization. He had previously been seen by Dr. Amalia Hailey of podiatry. He is a continued tobacco smoker. His angiogram on 6/21 via revealed a occluded SFAs bilaterally with one-vessel runoff. He had a atherectomy followed by a drug-coated balloon angioplasty of the right SFA. 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 He had some improvement in his right ABI from 0.6-0.85. There is also an attempt to open his left SFA but that was unsuccessful. He does not have a wound on the left side. We are using Santyl on the patient's wounds on the right dorsal foot. He says he has about a 1 minute exercise tolerance before stopping because of pain compatible with claudication 9/10; we are using Santyl on the wound on the right dorsal foot. Still has significant claudication symptoms. He is status post angioplasty of the right SFA and atherectomy. He has one-vessel runoff via a diseased peroneal. He follows up with Dr. Gwenlyn Found on 9/14 9/17; I had my notes from last week that the patient was to see Dr. Gwenlyn Found on 9/14 he tells me that the appointment is actually on 9/21 although I do not see that in epic. The next appointment I see with Dr. Gwenlyn Found is on 11/17. Nevertheless the patient is fairly adamant that his appointment is early next week I told him to call ahead before he goes. The wound is a lot better we have been using Santyl over that he just ran out of that all represcribe it. 10/1; patient  arrives today with nothing on his wound. He says he has been using the Santyl but he feels the border foam is too painful. We told him he needs to put a covering on this of some sort perhaps gauze. Indeed after our discussion last time his appointment with Dr. Gwenlyn Found 11/17. Previously he has had a atherectomy and an angioplasty of the right SFA. I note in my previous notes it was said he had bilateral one-vessel runoff I'll need to review the tibial vessels on the  angiogram. The patient is currently rating his pain at 8 out of 10 He followed up with Dr. Amalia Hailey at [facility] on 9/27. He emphasized to follow-up with vascular and [place]. Follow-up with them as needed. 10/15; patient ran out of Santyl and has been using Silvadene. He still has constant pain which I think is largely claudication. I have looked over his angiogram results. I need to communicate with Dr. Gwenlyn Found to see if anything else can be done here. He arrives in clinic today with tendon over most of the wound surface area 10/29; we are using silver collagen on the right foot. Still complaining of a lot of pain especially at night. Dr. Gwenlyn Found is out of town this week I will send him an email but he has a follow-up appointment on 11/17. 11/18; patient saw Dr. Gwenlyn Found yesterday. He has a history of a directional atherectomy followed by a drug-coated balloon angioplasty on the right SFA. 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 His right ABI increased from 0.6 2.85 however the right foot wound has not budged. He is going next Wednesday for an attempt to do tibial artery interventions. He mentions that he has one-vessel runoff via the peroneal.. They are going to attempt to look at the anterior tibial and posterior tibial revascularization next Wednesday. If they were successful at this then I will consider debridement and changing the topical dressings to this wound. Patient is still in a lot of pain Electronic Signature(s) Signed: 12/13/2019 5:14:03 PM By: Linton Ham MD Entered By: Linton Ham on 12/13/2019 11:17:26 -------------------------------------------------------------------------------- Physical Exam Details Patient Name: Date of Service: [name]. 12/13/2019 9:45 A M Medical Record Number: [id number] Patient Account Number: [id number] Date of Birth/Sex: Treating RN: 1960/08/14 (59 y.o. [name] Primary Care  Provider: [name] Other Clinician: Referring Provider: Treating Provider/Extender: Domenic Polite Weeks in Treatment: 17 Constitutional Sitting or standing Blood Pressure is within target range for patient.. Pulse regular and within target range for patient.Marland Kitchen Respirations regular, non-labored and within target range.. Temperature is normal and within the target range for the patient.Marland Kitchen Appears in no distress. Cardiovascular There is no palpable pulses in the right foot. Notes Wound exam; dorsal right foot. There is been absolutely no improvement in this. Nonviable necrotic surface with nonviable material. I did not see any exposed tendon this time. No debridement was done. I think there is some ischemic change around this wound as well Electronic Signature(s) Signed: 12/13/2019 5:14:03 PM By: Linton Ham MD Entered By: Linton Ham on 12/13/2019 11:18:26 -------------------------------------------------------------------------------- Physician Orders Details Patient Name: Date of Service: [name] 12/13/2019 9:45 A M Medical Record Number: [id number] Patient Account Number: [id number] Date of Birth/Sex: Treating RN: 1960/05/04 (59 y.o. [name] Primary Care Provider: [name] Other Clinician: Referring Provider: Treating Provider/Extender: Domenic Polite Weeks in Treatment:  75 Verbal / Phone Orders: No Diagnosis Coding ICD-10 Coding Code Description I73.89 Other specified peripheral vascular diseases E11.621 Type 2 diabetes mellitus with foot ulcer L97.512 Non-pressure chronic ulcer of other part of right foot with fat layer exposed I10 Essential (primary) hypertension F17.218 Nicotine dependence, cigarettes, with other nicotine-induced disorders Follow-up Appointments Return Appointment in 2 weeks. Dressing Change Frequency Wound #1 Right,Dorsal Foot Change Dressing every other day. Wound  Cleansing Wound #1 Right,Dorsal Foot May shower and wash wound with soap and water. Primary Wound Dressing Wound #1 Right,Dorsal Foot Silver Collagen - moisten with hydrogel or KY gel Secondary Dressing Wound #1 Right,Dorsal Foot Foam Border Off-Loading Open toe surgical shoe to: Additional Orders / Instructions Stop/Decrease Smoking Electronic Signature(s) Signed: 12/13/2019 5:14:03 PM By: Linton Ham MD Signed: 12/13/2019 5:38:55 PM By: Deon Pilling Entered By: Deon Pilling on 12/13/2019 11:05:35 -------------------------------------------------------------------------------- Problem List Details Patient Name: Date of Service: [name], [name] 12/13/2019 9:45 A M Medical Record Number: [id number] Patient Account Number: [id number] Date of Birth/Sex: Treating RN: [birth year] (59 y.o. [name] Primary Care Provider: [name] Other Clinician: Referring Provider: Treating Provider/Extender: Domenic Polite Weeks in Treatment: 17 Active Problems ICD-10 Encounter Code Description Active Date MDM Diagnosis I73.89 Other specified peripheral vascular diseases 08/15/2019 No Yes E11.621 Type 2 diabetes mellitus with foot ulcer 08/15/2019 No Yes L97.512 Non-pressure chronic ulcer of other part of right foot with fat layer exposed 08/15/2019 No Yes I10 Essential (primary) hypertension 08/15/2019 No Yes F17.218 Nicotine dependence, cigarettes, with other nicotine-induced disorders 08/15/2019 No Yes Inactive Problems Resolved Problems Electronic Signature(s) Signed: 12/13/2019 5:14:03 PM By: Linton Ham MD Entered By: Linton Ham on 12/13/2019 11:14:44 -------------------------------------------------------------------------------- Progress Note Details Patient Name: Date of Service: [name] 12/13/2019 9:45 A M Medical Record Number: [id number] Patient Account Number: [id number] Date of Birth/Sex: Treating RN: May 14, 1960  (60 y.o. [name] Primary Care Provider: [name] Other Clinician: Referring Provider: Treating Provider/Extender: Domenic Polite Weeks in Treatment: 17 Subjective History of Present Illness (HPI) 08/15/2019 upon evaluation today patient presents for evaluation here in our clinic concerning issues that he is having with wounds on the dorsal surface of his right foot. This is something that was noted to occur after he was obtaining cortisone injections with [facility]. Subsequently he developed ulcerations he was then referred to Dr. Alvester Chou who performed arterial testing and subsequently found that the patient had poor arterial flow with an ABI of 0.6 and a TBI of 0.26. With that being said he has since on 07/16/2019 had an angiogram on the right with improvement of his ABI to 0.85 and TBI to 0.5. Obviously this is not perfect but is definitely far superior to where it was previous. He does have some necrotic tissue on the surface of the wounds is going to need debriding away he has been using Silvadene on this but to be honest that is not can to do anything for these wounds. He needs something more to clear this away sharp debridement is ideal and we may even look towards Santyl as well. The patient does have a history of diabetes noted in his chart though is not on medication and his A1c was around 6.4 so is not terrible. With that being said he does have known peripheral vascular disease he is actually have an angiogram on the left tomorrow. He also has hypertension and he does smoke. 8/27; this is a patient has  been here once before about 6 weeks ago. Since then he was hospitalized from 7/22 through 7/23 by Dr. Gwenlyn Found for an attempt at revascularization. He had previously been seen by Dr. Amalia Hailey of podiatry. He is a continued tobacco smoker. His angiogram on 6/21 via revealed a occluded SFAs bilaterally with one-vessel runoff. He had a atherectomy followed by  a drug-coated balloon angioplasty of the right SFA. 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 He had some improvement in his right ABI from 0.6-0.85. There is also an attempt to open his left SFA but that was unsuccessful. He does not have a wound on the left side. We are using Santyl on the patient's wounds on the right dorsal foot. He says he has about a 1 minute exercise tolerance before stopping because of pain compatible with claudication 9/10; we are using Santyl on the wound on the right dorsal foot. Still has significant claudication symptoms. He is status post angioplasty of the right SFA and atherectomy. He has one-vessel runoff via a diseased peroneal. He follows up with Dr. Gwenlyn Found on 9/14 9/17; I had my notes from last week that the patient was to see Dr. Gwenlyn Found on 9/14 he tells me that the appointment is actually on 9/21 although I do not see that in epic. The next appointment I see with Dr. Gwenlyn Found is on 11/17. Nevertheless the patient is fairly adamant that his appointment is early next week I told him to call ahead before he goes. The wound is a lot better we have been using Santyl over that he just ran out of that all represcribe it. 10/1; patient arrives today with nothing on his wound. He says he has been using the Santyl but he feels the border foam is too painful. We told him he needs to put a covering on this of some sort perhaps gauze. Indeed after our discussion last time his appointment with Dr. Gwenlyn Found 11/17. Previously he has had a atherectomy and an angioplasty of the right SFA. I note in my previous notes it was said he had bilateral one-vessel runoff I'll need to review the tibial vessels on the angiogram. The patient is currently rating his pain at 8 out of 10 He followed up with Dr. Amalia Hailey at [facility] on 9/27. He emphasized to follow-up with vascular and [place]. Follow-up with them as needed. 10/15; patient ran out of Santyl and has been  using Silvadene. He still has constant pain which I think is largely claudication. I have looked over his angiogram results. I need to communicate with Dr. Gwenlyn Found to see if anything else can be done here. He arrives in clinic today with tendon over most of the wound surface area 10/29; we are using silver collagen on the right foot. Still complaining of a lot of pain especially at night. Dr. Gwenlyn Found is out of town this week I will send him an email but he has a follow-up appointment on 11/17. 11/18; patient saw Dr. Gwenlyn Found yesterday. He has a history of a directional atherectomy followed by a drug-coated balloon angioplasty on the right SFA. 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 His right ABI increased from 0.6 2.85 however the right foot wound has not budged. He is going next Wednesday for an attempt to do tibial artery interventions. He mentions that he has one-vessel runoff via the peroneal.. They are going to attempt  to look at the anterior tibial and posterior tibial revascularization next Wednesday. If they were successful at this then I will consider debridement and changing the topical dressings to this wound. Patient is still in a lot of pain Objective Constitutional Sitting or standing Blood Pressure is within target range for patient.. Pulse regular and within target range for patient.Marland Kitchen Respirations regular, non-labored and within target range.. Temperature is normal and within the target range for the patient.Marland Kitchen Appears in no distress. Vitals Time Taken: 10:21 AM, Height: 62 in, Weight: 130 lbs, BMI: 23.8, Temperature: 97.9 F, Pulse: 80 bpm, Respiratory Rate: 18 breaths/min, Blood Pressure: 131/79 mmHg. Cardiovascular There is no palpable pulses in the right foot. General Notes: Wound exam; dorsal right foot. There is been absolutely no improvement in this. Nonviable necrotic surface with nonviable material. I did not see any exposed  tendon this time. No debridement was done. I think there is some ischemic change around this wound as well Integumentary (Hair, Skin) Wound #1 status is Open. Original cause of wound was Gradually Appeared. The wound is located on the Right,Dorsal Foot. The wound measures 1.5cm length x 1.6cm width x 0.4cm depth; 1.885cm^2 area and 0.754cm^3 volume. There is tendon exposed. There is no tunneling or undermining noted. There is a medium amount of serosanguineous drainage noted. The wound margin is flat and intact. There is no granulation within the wound bed. There is a large (67-100%) amount of necrotic tissue within the wound bed including Eschar and Adherent Slough. Assessment Active Problems ICD-10 Other specified peripheral vascular diseases Type 2 diabetes mellitus with foot ulcer Non-pressure chronic ulcer of other part of right foot with fat layer exposed Essential (primary) hypertension Nicotine dependence, cigarettes, with other nicotine-induced disorders Plan Follow-up Appointments: Return Appointment in 2 weeks. Dressing Change Frequency: Wound #1 Right,Dorsal Foot: Change Dressing every other day. Wound Cleansing: Wound #1 Right,Dorsal Foot: May shower and wash wound with soap and water. Primary Wound Dressing: Wound #1 Right,Dorsal Foot: Silver Collagen - moisten with hydrogel or KY gel Secondary Dressing: Wound #1 Right,Dorsal Foot: Foam Border Off-Loading: Open toe surgical shoe to: Additional Orders / Instructions: Stop/Decrease Smoking 1. Continue with silver collagen 2 depending on the results of the vascular interventions next week I will probably attempt to debride this and change the dressing to something to help with ongoing debridement perhaps back to Santyl or Iodoflex. Electronic Signature(s) Signed: 12/13/2019 5:14:03 PM By: Linton Ham MD Entered By: Linton Ham on 12/13/2019  11:19:05 -------------------------------------------------------------------------------- SuperBill Details Patient Name: Date of Service: [name], [name] 12/13/2019 Medical Record Number: [id number] Patient Account Number: [id number] Date of Birth/Sex: Treating RN: 12-08-1960 (59 y.o. [name] Primary Care Provider: [name] Other Clinician: Referring Provider: Treating Provider/Extender: Domenic Polite Weeks in Treatment: 17 Diagnosis Coding ICD-10 Codes Code Description I73.89 Other specified peripheral vascular diseases E11.621 Type 2 diabetes mellitus with foot ulcer L97.512 Non-pressure chronic ulcer of other part of right foot with fat layer exposed I10 Essential (primary) hypertension F17.218 Nicotine dependence, cigarettes, with other nicotine-induced disorders Facility Procedures CPT4 Code: [id number] Description: 9213 - WOUND CARE VISIT-LEV 3 EST PT Modifier: Quantity: 1 Physician Procedures Electronic Signature(s) Signed: 12/13/2019 5:14:03 PM By: Linton Ham MD Entered By: Linton Ham on 12/13/2019 11:19:27

## 2019-12-14 ENCOUNTER — Ambulatory Visit: Payer: Medicare Other | Admitting: Cardiovascular Disease

## 2019-12-14 NOTE — Progress Notes (Signed)
RYLYN, RANGANATHAN ([id number]) Visit Report for 12/13/2019 Arrival Information Details Patient Name: Date of Service: Yaak RD, [place] 12/13/2019 9:45 A M Medical Record Number: [id number] Patient Account Number: [id number] Date of Birth/Sex: Treating RN: 05/02/1960 (59 y.o. [name]) Carlene Coria Primary Care Consuelo Thayne: [name] Other Clinician: Referring Shakisha Abend: Treating Makayleigh Poliquin/Extender: [name] in Treatment: 28 Visit Information History Since Last Visit All ordered tests and consults were completed: No Patient Arrived: Wheel Chair Added or deleted any medications: No Arrival Time: 10:13 Any new allergies or adverse reactions: No Accompanied By: self Had a fall or experienced change in No Transfer Assistance: None activities of daily living that may affect Patient Identification Verified: Yes risk of falls: Secondary Verification Process Completed: Yes Signs or symptoms of abuse/neglect since last visito No Patient Requires Transmission-Based Precautions: No Hospitalized since last visit: No Patient Has Alerts: Yes Implantable device outside of the clinic excluding No Patient Alerts: Patient on Blood Thinner cellular tissue based products placed in the center R ABI: 0.85 R TBI: 0.50 since last visit: Has Dressing in Place as Prescribed: No Pain Present Now: No Electronic Signature(s) Signed: 12/13/2019 5:30:22 PM By: Carlene Coria RN Entered By: Carlene Coria on 12/13/2019 10:21:43 -------------------------------------------------------------------------------- Clinic Level of Care Assessment Details Patient Name: Date of Service: [name] 12/13/2019 9:45 A M Medical Record Number: [id number] Patient Account Number: [id number] Date of Birth/Sex: Treating RN: 12-Jul-1960 (59 y.o. Lorette Ang, Meta.Reding Primary Care Tristan Bramble: [name] Other Clinician: Referring Mikiya Nebergall: Treating Cavan Bearden/Extender: [name] in Treatment: 17 Clinic Level of Care Assessment Items TOOL 4 Quantity Score X- 1 0 Use when only an EandM is performed on FOLLOW-UP visit ASSESSMENTS - Nursing Assessment / Reassessment X- 1 10 Reassessment of Co-morbidities (includes updates in patient status) X- 1 5 Reassessment of Adherence to Treatment Plan ASSESSMENTS - Wound and Skin A ssessment / Reassessment X - Simple Wound Assessment / Reassessment - one wound 1 5 '[]'  - 0 Complex Wound Assessment / Reassessment - multiple wounds X- 1 10 Dermatologic / Skin Assessment (not related to wound area) ASSESSMENTS - Focused Assessment X- 1 5 Circumferential Edema Measurements - multi extremities X- 1 10 Nutritional Assessment / Counseling / Intervention '[]'  - 0 Lower Extremity Assessment (monofilament, tuning fork, pulses) '[]'  - 0 Peripheral Arterial Disease Assessment (using hand held doppler) ASSESSMENTS - Ostomy and/or Continence Assessment and Care '[]'  - 0 Incontinence Assessment and Management '[]'  - 0 Ostomy Care Assessment and Management (repouching, etc.) PROCESS - Coordination of Care X - Simple Patient / Family Education for ongoing care 1 15 '[]'  - 0 Complex (extensive) Patient / Family Education for ongoing care X- 1 10 Staff obtains Programmer, systems, Records, T Results / Process Orders est '[]'  - 0 Staff telephones HHA, Nursing Homes / Clarify orders / etc '[]'  - 0 Routine Transfer to another Facility (non-emergent condition) '[]'  - 0 Routine Hospital Admission (non-emergent condition) '[]'  - 0 New Admissions / Biomedical engineer / Ordering NPWT Apligraf, etc. , '[]'  - 0 Emergency Hospital Admission (emergent condition) X- 1 10 Simple Discharge Coordination '[]'  - 0 Complex (extensive) Discharge Coordination PROCESS - Special Needs '[]'  - 0 Pediatric / Minor Patient Management '[]'  - 0 Isolation Patient Management '[]'  - 0 Hearing / Language / Visual special needs '[]'  - 0 Assessment of Community  assistance (transportation, D/C planning, etc.) '[]'  - 0 Additional assistance / Altered mentation '[]'  - 0 Support Surface(s) Assessment (bed,  cushion, seat, etc.) INTERVENTIONS - Wound Cleansing / Measurement X - Simple Wound Cleansing - one wound 1 5 '[]'  - 0 Complex Wound Cleansing - multiple wounds X- 1 5 Wound Imaging (photographs - any number of wounds) '[]'  - 0 Wound Tracing (instead of photographs) X- 1 5 Simple Wound Measurement - one wound '[]'  - 0 Complex Wound Measurement - multiple wounds INTERVENTIONS - Wound Dressings X - Small Wound Dressing one or multiple wounds 1 10 '[]'  - 0 Medium Wound Dressing one or multiple wounds '[]'  - 0 Large Wound Dressing one or multiple wounds '[]'  - 0 Application of Medications - topical '[]'  - 0 Application of Medications - injection INTERVENTIONS - Miscellaneous '[]'  - 0 External ear exam '[]'  - 0 Specimen Collection (cultures, biopsies, blood, body fluids, etc.) '[]'  - 0 Specimen(s) / Culture(s) sent or taken to Lab for analysis '[]'  - 0 Patient Transfer (multiple staff / Civil Service fast streamer / Similar devices) '[]'  - 0 Simple Staple / Suture removal (25 or less) '[]'  - 0 Complex Staple / Suture removal (26 or more) '[]'  - 0 Hypo / Hyperglycemic Management (close monitor of Blood Glucose) '[]'  - 0 Ankle / Brachial Index (ABI) - do not check if billed separately X- 1 5 Vital Signs Has the patient been seen at the hospital within the last three years: Yes Total Score: 110 Level Of Care: New/Established - Level 3 Electronic Signature(s) Signed: 12/13/2019 5:38:55 PM By: Deon Pilling Entered By: Deon Pilling on 12/13/2019 11:06:40 -------------------------------------------------------------------------------- Encounter Discharge Information Details Patient Name: Date of Service: Ruben Leavell J. 12/13/2019 9:45 A M Medical Record Number: [id number] Patient Account Number: [id number] Date of Birth/Sex: Treating RN: 1960/05/05 (59 y.o. [name] Primary Care Khiree Bukhari: [name] Other Clinician: Referring Rameen Gohlke: Treating Christl Fessenden/Extender: [name] in Treatment: 17 Encounter Discharge Information Items Discharge Condition: Stable Ambulatory Status: Wheelchair Discharge Destination: Home Transportation: Private Auto Accompanied By: self Schedule Follow-up Appointment: Yes Clinical Summary of Care: Patient Declined Electronic Signature(s) Signed: 12/14/2019 12:02:47 PM By: Rhae Hammock RN Entered By: Rhae Hammock on 12/13/2019 11:20:07 -------------------------------------------------------------------------------- Lower Extremity Assessment Details Patient Name: Date of Service: [name]. 12/13/2019 9:45 A M Medical Record Number: [id number] Patient Account Number: [id number] Date of Birth/Sex: Treating RN: [birth year] (59 y.o. [name]) Carlene Coria Primary Care Canary Fister: [name] Other Clinician: Referring Sharniece Gibbon: Treating Dorrie Cocuzza/Extender: Domenic Polite Weeks in Treatment: 17 Edema Assessment Assessed: Shirlyn Goltz: No] Patrice Paradise: No] Edema: [Left: N] [Right: o] Calf Left: Right: Point of Measurement: 28 cm From Medial Instep 27 cm Ankle Left: Right: Point of Measurement: 9 cm From Medial Instep 18 cm Electronic Signature(s) Signed: 12/13/2019 5:30:22 PM By: Carlene Coria RN Entered By: Carlene Coria on 12/13/2019 10:22:23 -------------------------------------------------------------------------------- Multi Wound Chart Details Patient Name: Date of Service: Ruben Leavell J. 12/13/2019 9:45 A M Medical Record Number: [id number] Patient Account Number: [id number] Date of Birth/Sex: Treating RN: 1960-05-18 (59 y.o. 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] [N/A:N/A] Secondary Etiology: [1:Hypertension, Peripheral Arterial] [N/A:N/A] Comorbid History: [1:Disease, Type II Diabetes, Neuropathy 03/26/2019] [N/A:N/A] Date  Acquired: [1:17] [N/A:N/A] Weeks of Treatment: [1:Open] [N/A:N/A] Wound Status: [1:1.5x1.6x0.4] [N/A:N/A] Measurements L x W x D (cm) [1:1.885] [N/A:N/A] A (cm) : rea [1:0.754] [N/A:N/A] Volume (cm) : [1:33.30%] [N/A:N/A] % Reduction in A rea: [1:-166.40%] [N/A:N/A] % Reduction in Volume: [1:Grade 2] [N/A:N/A] Classification: [1:Medium] [N/A:N/A] Exudate A mount: [1:Serosanguineous] [N/A:N/A] Exudate Type: [1:red, brown] [N/A:N/A] Exudate Color: [1:Flat and Intact] [N/A:N/A] Wound Margin: [1:None Present (0%)] [N/A:N/A] Granulation A mount: [1:Large (67-100%)] [N/A:N/A] Necrotic A mount: [1:Eschar, Adherent Slough] [N/A:N/A] Necrotic Tissue: [1:Tendon: Yes] [N/A:N/A] Exposed Structures: [1:Fascia: No Fat Layer (Subcutaneous Tissue): No Muscle: No Joint: No Bone: No Small (1-33%)] [N/A:N/A] Treatment Notes Electronic Signature(s) Signed: 12/13/2019 5:14:03 PM By: Linton Ham MD Signed: 12/13/2019 5:38:55 PM By: Deon Pilling Entered By: Linton Ham on 12/13/2019 11:14:54 -------------------------------------------------------------------------------- Multi-Disciplinary Care Plan Details Patient Name: Date of Service: [name] RD, [name] 12/13/2019 9:45 A M Medical Record Number: [id number] Patient Account Number: [id number] Date of Birth/Sex: Treating RN: December 17, 1960 (59 y.o. [name] Primary Care Gaynelle Pastrana: [name] Other  Clinician: Referring Babak Lucus: Treating Ebrima Ranta/Extender: [name] in Treatment: 17 Active Inactive Nutrition Nursing Diagnoses: Impaired glucose control: actual or potential Potential for alteratiion in Nutrition/Potential for imbalanced nutrition Goals: Patient/caregiver will maintain therapeutic glucose control Date Initiated: 08/15/2019 Target Resolution Date: 12/28/2019 Goal Status: Active Interventions: Assess HgA1c results as ordered upon admission and as needed Assess patient nutrition upon admission and as needed per policy Treatment Activities: Patient referred to Primary Care Physician for further nutritional evaluation : 08/15/2019 Notes: Wound/Skin Impairment Nursing Diagnoses: Impaired tissue integrity Knowledge deficit related to smoking impact on wound healing Knowledge deficit related to ulceration/compromised skin integrity Goals: Patient will demonstrate a reduced rate of smoking or cessation of smoking Date Initiated: 08/15/2019 Target Resolution Date: 12/28/2019 Goal Status: Active Patient/caregiver will verbalize understanding of skin care regimen Date Initiated: 08/15/2019 Target Resolution Date: 12/28/2019 Goal Status: Active Ulcer/skin breakdown will have a volume reduction of 30% by week 4 Date Initiated: 08/15/2019 Date Inactivated: 09/21/2019 Target Resolution Date: 09/12/2019 Goal Status: Met Interventions: Assess patient/caregiver ability to obtain necessary supplies Assess patient/caregiver ability to perform ulcer/skin care regimen upon admission and as needed Assess ulceration(s) every visit Provide education on ulcer and skin care Treatment Activities: Skin care regimen initiated : 08/15/2019 Topical wound management initiated : 08/15/2019 Notes: Electronic Signature(s) Signed: 12/13/2019 5:38:55 PM By: Deon Pilling Entered By: Deon Pilling on 12/13/2019  09:52:20 -------------------------------------------------------------------------------- Pain Assessment Details Patient Name: Date of Service: [name]. 12/13/2019 9:45 A M Medical Record Number: [id number] Patient Account Number: [id number] Date of Birth/Sex: Treating RN: 06/16/1960 (59 y.o. [name]) Carlene Coria Primary Care Kyden Potash: [name] Other Clinician: Referring Zamariya Neal: Treating Airyonna Franklyn/Extender: Domenic Polite Weeks in Treatment: 17 Active Problems Location of Pain Severity and Description of Pain Patient Has Paino No Site Locations Pain Management and Medication Current Pain Management: Electronic Signature(s) Signed: 12/13/2019 5:30:22 PM By: Carlene Coria RN Entered By: Carlene Coria on 12/13/2019 10:22:09 -------------------------------------------------------------------------------- Patient/Caregiver Education Details Patient Name: Date of Service: [name] 11/18/2021andnbsp9:45 A M Medical Record Number: [id number] Patient Account Number: [id number] Date of Birth/Gender: Treating RN: [birth year] (59 y.o. [name] Primary Care Physician: [name] Other Clinician: Referring Physician: Treating Physician/Extender: [name] in Treatment: 61 Education Assessment Education Provided To: Patient Education Topics Provided Wound/Skin Impairment: Handouts: Skin Care Do's and Dont's Methods: Explain/Verbal Responses: Reinforcements needed Electronic Signature(s) Signed: 12/13/2019 5:38:55  PM By: Deon Pilling Entered By: Deon Pilling on 12/13/2019 09:52:48 -------------------------------------------------------------------------------- Wound Assessment Details Patient Name: Date of Service: [place] RD, [name] 12/13/2019 9:45 A M Medical Record Number: [id number] Patient Account Number: [id number] Date of Birth/Sex: Treating RN: [birth year] (59 y.o. [name]) Carlene Coria Primary Care Maebelle Sulton: [name] Other Clinician: Referring Wayne Wicklund: Treating Emmamarie Kluender/Extender: Domenic Polite Weeks in Treatment: 17 Wound Status Wound Number: 1 Primary Diabetic Wound/Ulcer of the Lower Extremity Etiology: Wound Location: Right, Dorsal Foot Secondary Arterial Insufficiency Ulcer Wounding Event: Gradually Appeared Etiology: Date Acquired: 03/26/2019 Wound Status: Open Weeks Of Treatment: 17 Comorbid Hypertension, Peripheral Arterial Disease, Type II Diabetes, Clustered Wound: No History: Neuropathy Wound Measurements Length: (cm) 1.5 Width: (cm) 1.6 Depth: (cm) 0.4 Area: (cm) 1.885 Volume: (cm) 0.754 % Reduction in Area: 33.3% % Reduction in Volume: -166.4% Epithelialization: Small (1-33%) Tunneling: No Undermining: No Wound Description Classification: Grade 2 Wound Margin: Flat and Intact Exudate Amount: Medium Exudate Type: Serosanguineous Exudate Color: red, brown Foul Odor After Cleansing: No Slough/Fibrino Yes Wound Bed Granulation Amount: None Present (0%) Exposed Structure Necrotic Amount: Large (67-100%) Fascia Exposed: No Necrotic Quality: Eschar, Adherent Slough Fat Layer (Subcutaneous Tissue) Exposed: No Tendon Exposed: Yes Muscle Exposed: No Joint Exposed: No Bone Exposed: No Treatment Notes Wound #1 (Right, Dorsal Foot) 1. Cleanse With Saline Soap and water 2. Periwound Care Skin Prep 3. Primary Dressing Applied Collegen AG 4. Secondary Dressing Foam Border Dressing Notes surgical boot Electronic Signature(s) Signed: 12/13/2019 5:30:22 PM By: Carlene Coria RN Entered By: Carlene Coria on 12/13/2019 10:22:55 -------------------------------------------------------------------------------- [place] Details Patient Name: Date of Service: [name] RD, [place]. 12/13/2019 9:45 A M Medical Record Number: [id number] Patient Account Number: [id number] Date of Birth/Sex: Treating RN: 14-Feb-1960 (59  y.o. [name]) Carlene Coria Primary Care Hjalmer Iovino: [name] Other Clinician: Referring Hill Mackie: Treating Yona Stansbury/Extender: Domenic Polite Weeks in Treatment: 17 Vital Signs Time Taken: 10:21 Temperature (F): 97.9 Height (in): 62 Pulse (bpm): 80 Weight (lbs): 130 Respiratory Rate (breaths/min): 18 Body Mass Index (BMI): 23.8 Blood Pressure (mmHg): 131/79 Reference Range: 80 - 120 mg / dl Electronic Signature(s) Signed: 12/13/2019 5:30:22 PM By: Carlene Coria RN Entered By: Carlene Coria on 12/13/2019 10:22:03

## 2019-12-15 ENCOUNTER — Inpatient Hospital Stay (HOSPITAL_COMMUNITY): Admission: RE | Admit: 2019-12-15 | Payer: Medicare Other | Source: Ambulatory Visit

## 2019-12-17 ENCOUNTER — Telehealth: Payer: Self-pay | Admitting: *Deleted

## 2019-12-17 NOTE — Telephone Encounter (Signed)
Pt contacted pre-catheterization scheduled at [facility] for: Wednesday December 19, 2019 12:30 PM Verified arrival time and place: [facility]) at: 10:30 AM   No solid food after midnight prior to cath, clear liquids until 5 AM day of procedure.  Hold: Lisinopriol-HCTZ-AM of procedure  Except hold medications AM meds can be  taken pre-cath with sips of water including: ASA 81 mg Plavix 75 mg  Confirmed patient has responsible adult to drive home post procedure and be with patient first 24 hours after arriving home: yes  You are allowed ONE visitor in the waiting room during the time you are at the hospital for your procedure. Both you and your visitor must wear a mask once you enter the hospital.       COVID-19 Pre-Screening Questions:  . In the past 14 days have you had any symptoms concerning for COVID-19 infection (fever, chills, cough, or new shortness of breath)? no . In the past 14 days have you been around anyone with known Covid 19? No   Reviewed procedure/mask/visitor instructions, COVID-19 questions with patient.

## 2019-12-17 NOTE — Telephone Encounter (Signed)
It looks like patient did not show for pre-procedure COVID-19 test scheduled 12/15/19, call placed to patient to discuss.

## 2019-12-17 NOTE — Telephone Encounter (Addendum)
LMTCB for pt to discuss pre-procedure COVID-19 test.

## 2019-12-17 NOTE — Telephone Encounter (Signed)
Pt reports he wasn't able to make it to pre-procedure COVID-19 scheduled 12/15/19. Pt is aware that pre-procedure COVID-19 test has been scheduled for 12/18/19 10 AM. Pt states he has location directions and is aware that he needs to quarantine at home after COVID test until he goes to hospital for procedure. Pt is aware that it is necessary to COVID test done prior to PV procedure 12/19/19 and PV procedure will need to be rescheduled if he is unable to have this done.

## 2019-12-18 ENCOUNTER — Other Ambulatory Visit (HOSPITAL_COMMUNITY)
Admission: RE | Admit: 2019-12-18 | Discharge: 2019-12-18 | Disposition: A | Discharge status: Home / Self Care | Payer: Medicare Other | Source: Ambulatory Visit | Attending: Cardiovascular Disease | Admitting: Cardiovascular Disease

## 2019-12-18 DIAGNOSIS — Z01812 Encounter for preprocedural laboratory examination: Secondary | ICD-10-CM | POA: Insufficient documentation

## 2019-12-18 DIAGNOSIS — Z20822 Contact with and (suspected) exposure to covid-19: Secondary | ICD-10-CM | POA: Insufficient documentation

## 2019-12-18 LAB — SARS CORONAVIRUS 2 (TAT 6-24 HRS): SARS Coronavirus 2: NEGATIVE

## 2019-12-19 ENCOUNTER — Encounter (HOSPITAL_COMMUNITY)
Admission: AD | Disposition: A | Discharge status: Home / Self Care | Payer: Self-pay | Source: Home / Self Care | Attending: Cardiovascular Disease

## 2019-12-19 ENCOUNTER — Other Ambulatory Visit: Payer: Self-pay

## 2019-12-19 ENCOUNTER — Observation Stay (HOSPITAL_COMMUNITY)
Admission: AD | Admit: 2019-12-19 | Discharge: 2019-12-20 | Disposition: A | Discharge status: Home / Self Care | Payer: Medicare Other | Attending: Cardiovascular Disease | Admitting: Cardiovascular Disease

## 2019-12-19 DIAGNOSIS — I129 Hypertensive chronic kidney disease with stage 1 through stage 4 chronic kidney disease, or unspecified chronic kidney disease: Secondary | ICD-10-CM | POA: Insufficient documentation

## 2019-12-19 DIAGNOSIS — E1122 Type 2 diabetes mellitus with diabetic chronic kidney disease: Secondary | ICD-10-CM | POA: Diagnosis not present

## 2019-12-19 DIAGNOSIS — I998 Other disorder of circulatory system: Secondary | ICD-10-CM | POA: Diagnosis present

## 2019-12-19 DIAGNOSIS — Z79899 Other long term (current) drug therapy: Secondary | ICD-10-CM | POA: Diagnosis not present

## 2019-12-19 DIAGNOSIS — Z7902 Long term (current) use of antithrombotics/antiplatelets: Secondary | ICD-10-CM | POA: Insufficient documentation

## 2019-12-19 DIAGNOSIS — E1151 Type 2 diabetes mellitus with diabetic peripheral angiopathy without gangrene: Secondary | ICD-10-CM | POA: Diagnosis present

## 2019-12-19 DIAGNOSIS — Z7901 Long term (current) use of anticoagulants: Secondary | ICD-10-CM | POA: Diagnosis not present

## 2019-12-19 DIAGNOSIS — N183 Chronic kidney disease, stage 3 unspecified: Secondary | ICD-10-CM | POA: Insufficient documentation

## 2019-12-19 DIAGNOSIS — I70229 Atherosclerosis of native arteries of extremities with rest pain, unspecified extremity: Secondary | ICD-10-CM

## 2019-12-19 DIAGNOSIS — L97519 Non-pressure chronic ulcer of other part of right foot with unspecified severity: Secondary | ICD-10-CM | POA: Insufficient documentation

## 2019-12-19 DIAGNOSIS — F17210 Nicotine dependence, cigarettes, uncomplicated: Secondary | ICD-10-CM | POA: Insufficient documentation

## 2019-12-19 DIAGNOSIS — E11621 Type 2 diabetes mellitus with foot ulcer: Secondary | ICD-10-CM | POA: Insufficient documentation

## 2019-12-19 DIAGNOSIS — Z7982 Long term (current) use of aspirin: Secondary | ICD-10-CM | POA: Diagnosis not present

## 2019-12-19 DIAGNOSIS — I70235 Atherosclerosis of native arteries of right leg with ulceration of other part of foot: Secondary | ICD-10-CM

## 2019-12-19 DIAGNOSIS — I739 Peripheral vascular disease, unspecified: Secondary | ICD-10-CM | POA: Diagnosis not present

## 2019-12-19 HISTORY — PX: ABDOMINAL AORTOGRAM W/LOWER EXTREMITY: CATH118223

## 2019-12-19 HISTORY — PX: PERIPHERAL VASCULAR INTERVENTION: CATH118257

## 2019-12-19 LAB — GLUCOSE, CAPILLARY
Glucose-Capillary: 98 mg/dL (ref 70–99)
Glucose-Capillary: 88 mg/dL (ref 70–99)

## 2019-12-19 LAB — POCT ACTIVATED CLOTTING TIME: Activated Clotting Time: 241 seconds

## 2019-12-19 SURGERY — ABDOMINAL AORTOGRAM W/LOWER EXTREMITY
Anesthesia: LOCAL | Laterality: Right

## 2019-12-19 MED ORDER — MIDAZOLAM HCL 2 MG/2ML IJ SOLN
INTRAMUSCULAR | Status: AC
  Filled 2019-12-19: qty 2

## 2019-12-19 MED ORDER — FENTANYL CITRATE (PF) 100 MCG/2ML IJ SOLN
50.0000 ug | Freq: Once | INTRAMUSCULAR | Status: AC
  Administered 2019-12-19: 50 ug via INTRAVENOUS

## 2019-12-19 MED ORDER — FENTANYL CITRATE (PF) 100 MCG/2ML IJ SOLN
INTRAMUSCULAR | Status: AC
  Filled 2019-12-19: qty 2

## 2019-12-19 MED ORDER — FENTANYL CITRATE (PF) 100 MCG/2ML IJ SOLN
INTRAMUSCULAR | Status: DC | PRN
  Administered 2019-12-19 (×4): 25 ug via INTRAVENOUS

## 2019-12-19 MED ORDER — SODIUM CHLORIDE 0.9% FLUSH: 3.0000 mL | INTRAVENOUS | Status: DC | PRN

## 2019-12-19 MED ORDER — LISINOPRIL-HYDROCHLOROTHIAZIDE 20-12.5 MG PO TABS: 1.0000 | ORAL_TABLET | Freq: Every day | ORAL | Status: DC

## 2019-12-19 MED ORDER — ATORVASTATIN CALCIUM 80 MG PO TABS
80.0000 mg | ORAL_TABLET | Freq: Every day | ORAL | Status: DC
  Administered 2019-12-19 – 2019-12-20 (×2): 80 mg via ORAL
  Filled 2019-12-19 (×2): qty 1

## 2019-12-19 MED ORDER — CLOPIDOGREL BISULFATE 75 MG PO TABS
75.0000 mg | ORAL_TABLET | Freq: Every day | ORAL | Status: DC
  Administered 2019-12-20: 75 mg via ORAL
  Filled 2019-12-19: qty 1

## 2019-12-19 MED ORDER — ONDANSETRON HCL 4 MG/2ML IJ SOLN: 4.0000 mg | Freq: Four times a day (QID) | INTRAMUSCULAR | Status: DC | PRN

## 2019-12-19 MED ORDER — ACETAMINOPHEN 325 MG PO TABS: 650.0000 mg | ORAL_TABLET | ORAL | Status: DC | PRN

## 2019-12-19 MED ORDER — ACETAMINOPHEN 325 MG PO TABS
650.0000 mg | ORAL_TABLET | ORAL | Status: DC | PRN
  Filled 2019-12-19: qty 2

## 2019-12-19 MED ORDER — HEPARIN (PORCINE) IN NACL 1000-0.9 UT/500ML-% IV SOLN
INTRAVENOUS | Status: DC | PRN
  Administered 2019-12-19 (×2): 500 mL

## 2019-12-19 MED ORDER — SODIUM CHLORIDE 0.9 % WEIGHT BASED INFUSION: 1.0000 mL/kg/h | INTRAVENOUS | Status: DC

## 2019-12-19 MED ORDER — HYDRALAZINE HCL 20 MG/ML IJ SOLN: 5.0000 mg | INTRAMUSCULAR | Status: DC | PRN

## 2019-12-19 MED ORDER — FENTANYL CITRATE (PF) 100 MCG/2ML IJ SOLN
INTRAMUSCULAR | Status: DC | PRN
Start: 2019-12-19 — End: 2019-12-19
  Administered 2019-12-19: 50 ug via INTRAVENOUS

## 2019-12-19 MED ORDER — SODIUM CHLORIDE 0.9 % IV SOLN: INTRAVENOUS | Status: AC

## 2019-12-19 MED ORDER — IOPAMIDOL (ISOVUE-370) INJECTION 76%
INTRAVENOUS | Status: DC | PRN
  Administered 2019-12-19: 120 mL

## 2019-12-19 MED ORDER — ASPIRIN EC 81 MG PO TBEC
81.0000 mg | DELAYED_RELEASE_TABLET | Freq: Every day | ORAL | Status: DC
  Administered 2019-12-20: 81 mg via ORAL
  Filled 2019-12-19: qty 1

## 2019-12-19 MED ORDER — HEPARIN (PORCINE) IN NACL 1000-0.9 UT/500ML-% IV SOLN
INTRAVENOUS | Status: AC
  Filled 2019-12-19: qty 1000

## 2019-12-19 MED ORDER — TRAMADOL HCL 50 MG PO TABS
50.0000 mg | ORAL_TABLET | Freq: Three times a day (TID) | ORAL | Status: DC | PRN
  Administered 2019-12-19: 50 mg via ORAL
  Filled 2019-12-19: qty 1

## 2019-12-19 MED ORDER — HYDROCHLOROTHIAZIDE 12.5 MG PO CAPS
12.5000 mg | ORAL_CAPSULE | Freq: Every day | ORAL | Status: DC
  Administered 2019-12-19 – 2019-12-20 (×2): 12.5 mg via ORAL
  Filled 2019-12-19 (×2): qty 1

## 2019-12-19 MED ORDER — MIDAZOLAM HCL 2 MG/2ML IJ SOLN
INTRAMUSCULAR | Status: DC | PRN
  Administered 2019-12-19 (×4): 1 mg via INTRAVENOUS

## 2019-12-19 MED ORDER — LIDOCAINE HCL (PF) 1 % IJ SOLN
INTRAMUSCULAR | Status: AC
  Filled 2019-12-19: qty 30

## 2019-12-19 MED ORDER — LABETALOL HCL 5 MG/ML IV SOLN: 10.0000 mg | INTRAVENOUS | Status: DC | PRN

## 2019-12-19 MED ORDER — CLOPIDOGREL BISULFATE 300 MG PO TABS
ORAL_TABLET | ORAL | Status: AC
  Filled 2019-12-19: qty 1

## 2019-12-19 MED ORDER — LIDOCAINE HCL (PF) 1 % IJ SOLN
INTRAMUSCULAR | Status: DC | PRN
  Administered 2019-12-19: 15 mL

## 2019-12-19 MED ORDER — SODIUM CHLORIDE 0.9 % IV SOLN: 250.0000 mL | INTRAVENOUS | Status: DC | PRN

## 2019-12-19 MED ORDER — ASPIRIN 81 MG PO CHEW
81.0000 mg | CHEWABLE_TABLET | ORAL | Status: AC
  Administered 2019-12-19: 81 mg via ORAL
  Filled 2019-12-19: qty 1

## 2019-12-19 MED ORDER — LISINOPRIL 20 MG PO TABS
20.0000 mg | ORAL_TABLET | Freq: Every day | ORAL | Status: DC
  Administered 2019-12-19 – 2019-12-20 (×2): 20 mg via ORAL
  Filled 2019-12-19 (×2): qty 1

## 2019-12-19 MED ORDER — HEPARIN SODIUM (PORCINE) 1000 UNIT/ML IJ SOLN
INTRAMUSCULAR | Status: DC | PRN
  Administered 2019-12-19: 3000 [IU] via INTRAVENOUS

## 2019-12-19 MED ORDER — SODIUM CHLORIDE 0.9 % WEIGHT BASED INFUSION
3.0000 mL/kg/h | INTRAVENOUS | Status: AC
  Administered 2019-12-19: 3 mL/kg/h via INTRAVENOUS

## 2019-12-19 MED ORDER — ASPIRIN 81 MG PO TBEC: 81.0000 mg | DELAYED_RELEASE_TABLET | Freq: Every day | ORAL | Status: DC

## 2019-12-19 MED ORDER — SODIUM CHLORIDE 0.9% FLUSH
3.0000 mL | Freq: Two times a day (BID) | INTRAVENOUS | Status: DC
  Administered 2019-12-20: 3 mL via INTRAVENOUS

## 2019-12-19 MED ORDER — CLOPIDOGREL BISULFATE 300 MG PO TABS
ORAL_TABLET | ORAL | Status: DC | PRN
  Administered 2019-12-19: 300 mg via ORAL

## 2019-12-19 SURGICAL SUPPLY — 21 items
BALLN MUSTANG 6X60X135 (BALLOONS) ×3
BALLOON MUSTANG 6X60X135 (BALLOONS) IMPLANT
CATH ANGIO 5F PIGTAIL 65CM (CATHETERS) ×1 IMPLANT
CATH CROSS OVER TEMPO 5F (CATHETERS) ×1 IMPLANT
CATH STRAIGHT 5FR 65CM (CATHETERS) ×1 IMPLANT
KIT ENCORE 26 ADVANTAGE (KITS) ×1 IMPLANT
KIT MICROPUNCTURE NIT STIFF (SHEATH) ×1 IMPLANT
KIT PV (KITS) ×3 IMPLANT
SHEATH PINNACLE 5F 10CM (SHEATH) ×1 IMPLANT
SHEATH PINNACLE 6F 10CM (SHEATH) ×1 IMPLANT
SHEATH PINNACLE MP 6F 45CM (SHEATH) ×1 IMPLANT
SHEATH PROBE COVER 6X72 (BAG) ×1 IMPLANT
STENT ELUVIA 7X60X130 (Permanent Stent) ×1 IMPLANT
STOPCOCK MORSE 400PSI 3WAY (MISCELLANEOUS) ×1 IMPLANT
SYR MEDRAD MARK 7 150ML (SYRINGE) ×3 IMPLANT
TRANSDUCER W/STOPCOCK (MISCELLANEOUS) ×3 IMPLANT
TRAY PV CATH (CUSTOM PROCEDURE TRAY) ×3 IMPLANT
TUBING CIL FLEX 10 FLL-RA (TUBING) ×1 IMPLANT
WIRE BENTSON .035X145CM (WIRE) ×1 IMPLANT
WIRE HI TORQ VERSACORE J 260CM (WIRE) ×1 IMPLANT
WIRE J 3MM .035X145CM (WIRE) ×1 IMPLANT

## 2019-12-19 NOTE — Progress Notes (Signed)
Site area: Left groin a 6 french arterial sheath was removed during a [employer] deploment but was unsuccessful.  Manual pressure held for 30 mins  Site Prior to Removal:  Level 0  Pressure Applied For 30 MINUTES    Bedrest Beginning at 1500pm for 5 hours  Manual:   Yes.    Patient Status During Pull:  stable  Post Pull Groin Site:  Level 0  Post Pull Instructions Given:  Yes.    Post Pull Pulses Present:  Yes.    Dressing Applied:  Yes.    Comments:

## 2019-12-19 NOTE — Plan of Care (Signed)

## 2019-12-19 NOTE — Interval H&P Note (Signed)
History and Physical Interval Note:  12/19/2019 1:16 PM  [name].  has presented today for surgery, with the diagnosis of non healing wound.  The various methods of treatment have been discussed with the patient and family. After consideration of risks, benefits and other options for treatment, the patient has consented to  Procedure(s): ABDOMINAL AORTOGRAM W/LOWER EXTREMITY (N/A) as a surgical intervention.  The patient's history has been reviewed, patient examined, no change in status, stable for surgery.  I have reviewed the patient's chart and labs.  Questions were answered to the patient's satisfaction.     Kathlyn [place]

## 2019-12-19 NOTE — Progress Notes (Signed)
Report called to RN for 3E14 and transferred via w/c

## 2019-12-19 NOTE — Progress Notes (Signed)
    Called by Short Stay regarding patient being discharged this evening. RN reports that the patient had a ride arranged however the ride fell through and he no longer has a way to get home.   Orders placed to keep in observation overnight with plans for early discharge in the AM.    Kathyrn Drown NP-C [facility] Pager: [phone]

## 2019-12-19 NOTE — Progress Notes (Signed)
Client states "I have no ride home" states "the person coming to get me had to work a double shift"

## 2019-12-19 NOTE — Discharge Instructions (Signed)
Femoral Site Care This sheet gives you information about how to care for yourself after your procedure. Your health care provider may also give you more specific instructions. If you have problems or questions, contact your health care provider. What can I expect after the procedure? After the procedure, it is common to have:  Bruising that usually fades within 1-2 weeks.  Tenderness at the site. Follow these instructions at home: Wound care  Follow instructions from your health care provider about how to take care of your insertion site. Make sure you: ? Wash your hands with soap and water before you change your bandage (dressing). If soap and water are not available, use hand sanitizer. ? Change your dressing as told by your health care provider. ? Leave stitches (sutures), skin glue, or adhesive strips in place. These skin closures may need to stay in place for 2 weeks or longer. If adhesive strip edges start to loosen and curl up, you may trim the loose edges. Do not remove adhesive strips completely unless your health care provider tells you to do that.  Do not take baths, swim, or use a hot tub until your health care provider approves.  You may shower 24-48 hours after the procedure or as told by your health care provider. ? Gently wash the site with plain soap and water. ? Pat the area dry with a clean towel. ? Do not rub the site. This may cause bleeding.  Do not apply powder or lotion to the site. Keep the site clean and dry.  Check your femoral site every day for signs of infection. Check for: ? Redness, swelling, or pain. ? Fluid or blood. ? Warmth. ? Pus or a bad smell. Activity  For the first 2-3 days after your procedure, or as long as directed: ? Avoid climbing stairs as much as possible. ? Do not squat.  Do not lift anything that is heavier than 10 lb (4.5 kg), or the limit that you are told, until your health care provider says that it is safe.  Rest as  directed. ? Avoid sitting for a long time without moving. Get up to take short walks every 1-2 hours.  Do not drive for 24 hours if you were given a medicine to help you relax (sedative). General instructions  Take over-the-counter and prescription medicines only as told by your health care provider.  Keep all follow-up visits as told by your health care provider. This is important. Contact a health care provider if you have:  A fever or chills.  You have redness, swelling, or pain around your insertion site. Get help right away if:  The catheter insertion area swells very fast.  You pass out.  You suddenly start to sweat or your skin gets clammy.  The catheter insertion area is bleeding, and the bleeding does not stop when you hold steady pressure on the area.  The area near or just beyond the catheter insertion site becomes pale, cool, tingly, or numb. These symptoms may represent a serious problem that is an emergency. Do not wait to see if the symptoms will go away. Get medical help right away. Call your local emergency services (911 in the [place] Do not drive yourself to the hospital. Summary  After the procedure, it is common to have bruising that usually fades within 1-2 weeks.  Check your femoral site every day for signs of infection.  Do not lift anything that is heavier than 10 lb (4.5 kg), or the   limit that you are told, until your health care provider says that it is safe. This information is not intended to replace advice given to you by your health care provider. Make sure you discuss any questions you have with your health care provider. Document Revised: 01/24/2017 Document Reviewed: 01/24/2017 Elsevier Patient Education  2020 [employer]  

## 2019-12-19 NOTE — Progress Notes (Signed)
Patient arrived to unit. NSR. Left groin site level 0. Right foot has deep ulceration wound-patient says he dresses it with gauze and uses santyl on it daily. No wound care orders for right foot wound so RN dressed with gauze and wrapped it. No other complaints. VSS. Will continue to monitor.

## 2019-12-20 DIAGNOSIS — I739 Peripheral vascular disease, unspecified: Secondary | ICD-10-CM

## 2019-12-20 DIAGNOSIS — E1151 Type 2 diabetes mellitus with diabetic peripheral angiopathy without gangrene: Secondary | ICD-10-CM | POA: Diagnosis not present

## 2019-12-20 LAB — BASIC METABOLIC PANEL
Anion gap: 7 (ref 5–15)
BUN: 14 mg/dL (ref 6–20)
CO2: 25 mmol/L (ref 22–32)
Calcium: 9.4 mg/dL (ref 8.9–10.3)
Chloride: 105 mmol/L (ref 98–111)
Creatinine, Ser: 0.88 mg/dL (ref 0.61–1.24)
GFR, Estimated: >60 mL/min (ref >60)
Glucose, Bld: 100 mg/dL — ABNORMAL HIGH (ref 70–99)
Potassium: 4.1 mmol/L (ref 3.5–5.1)
Sodium: 137 mmol/L (ref 135–145)

## 2019-12-20 LAB — HIV ANTIBODY (ROUTINE TESTING W REFLEX): HIV Screen 4th Generation wRfx: NONREACTIVE

## 2019-12-20 NOTE — Progress Notes (Addendum)
Progress Note  Patient Name: [name]. Date of Encounter: 12/20/2019  Primary Cardiologist: Dr. O'Neal/Dr. Gwenlyn Found  Subjective   Pt feeling well today. No specific complaints. Discharge today   Inpatient Medications    Scheduled Meds: . aspirin EC  81 mg Oral Daily  . atorvastatin  80 mg Oral Daily  . clopidogrel  75 mg Oral Q breakfast  . hydrochlorothiazide  12.5 mg Oral Daily  . lisinopril  20 mg Oral Daily  . sodium chloride flush  3 mL Intravenous Q12H   Continuous Infusions: . sodium chloride    . sodium chloride     PRN Meds: sodium chloride, sodium chloride, acetaminophen, hydrALAZINE, labetalol, ondansetron (ZOFRAN) IV, sodium chloride flush, sodium chloride flush, traMADol   Vital Signs    Vitals:   12/19/19 2040 12/19/19 2346 12/20/19 0047 12/20/19 0445  BP: (!) 160/81 123/77  122/80  Pulse: 69 72  79  Resp: 18 18  18   Temp: 98.4 F (36.9 C) 97.9 F (36.6 C)  98 F (36.7 C)  TempSrc: Oral Oral  Oral  SpO2: 100% 100%  100%  Weight: 46 kg  45.9 kg   Height: 5\' 2"  (1.575 m)       Intake/Output Summary (Last 24 hours) at 12/20/2019 0713 Last data filed at 12/20/2019 0047 Gross per 24 hour  Intake 240 ml  Output 300 ml  Net -60 ml   Filed Weights   12/19/19 1054 12/19/19 2040 12/20/19 0047  Weight: 49.9 kg 46 kg 45.9 kg    Physical Exam   General: Well developed, well nourished, NAD Neck: Negative for carotid bruits. No JVD Lungs:Clear to ausculation bilaterally. No wheezes, rales, or rhonchi. Breathing is unlabored. Cardiovascular: RRR with S1 S2. No murmurs Abdomen: Soft, non-tender, non-distended. No obvious abdominal masses. Extremities: No edema. Radial pulses 2+ bilaterally Neuro: Alert and oriented. No focal deficits. No facial asymmetry. MAE spontaneously. Psych: Responds to questions appropriately with normal affect.   Psych: AAOx3.  Normal affect.  Labs    Chemistry Recent Labs  Lab 12/20/19 0239  NA 137  K  4.1  CL 105  CO2 25  GLUCOSE 100*  BUN 14  CREATININE 0.88  CALCIUM 9.4  GFRNONAA >60  ANIONGAP 7     HematologyNo results for input(s): WBC, RBC, HGB, HCT, MCV, MCH, MCHC, RDW, PLT in the last 168 hours.  Cardiac EnzymesNo results for input(s): TROPONINI in the last 168 hours. No results for input(s): TROPIPOC in the last 168 hours.   BNPNo results for input(s): BNP, PROBNP in the last 168 hours.   DDimer No results for input(s): DDIMER in the last 168 hours.   Radiology    PERIPHERAL VASCULAR CATHETERIZATION  Result Date: 12/19/2019 1.  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  Full runoff was not performed. 2.  Right lower extremity: Severe calcified stenosis in the distal common iliac artery into the external iliac artery associated with 88 mm systolic gradient, patent SFA with mild nonobstructive disease, normal popliteal artery, one-vessel runoff below the knee via the peroneal artery which has mild nonobstructive disease and reconstitutes the posterior distal artery to [facility] via a very well developed collateral.  The posterior tibial artery gives good flow to the whole pedal arch.  The anterior tibial artery is occluded with reconstitution distally. 3.  Successful drug-eluting stent placement to the right common iliac artery into the external iliac artery. Recommendations: Right anterior tibial artery is not amenable  for revascularization as it does not reconstitute distally.  The patient seems to have excellent inline flow via a very well developed collaterals from the peroneal artery to the posterior tibial artery.  The blood flow should improve significantly after treating his iliac disease. Continue dual antiplatelet therapy and aggressive treatment of risk factors.  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  Full runoff was not performed. 2.  Right lower extremity: Severe calcified stenosis in the distal common iliac artery into the external iliac artery associated with 88 mm systolic gradient, patent SFA with mild nonobstructive disease, normal popliteal artery, one-vessel runoff below the knee via the peroneal artery which has mild nonobstructive disease and reconstitutes the posterior distal artery to [facility] via a very well developed collateral.  The posterior tibial artery gives good flow to the whole pedal arch.  The anterior tibial artery is occluded with reconstitution distally. 3.  Successful drug-eluting stent placement to the right common iliac artery into the external iliac artery.  Recommendations: Right anterior tibial artery is not amenable for revascularization as it does not reconstitute distally.  The patient seems to have excellent inline flow via a very well developed collaterals from the peroneal artery to the posterior tibial artery.  The blood flow should improve significantly after treating his iliac disease. Continue dual antiplatelet therapy and aggressive treatment of risk factors.  Patient Profile     59 y.o. male with a history of PAD, hypertension, hyperlipidemia, type 2 diabetes mellitus, CKD and significant smoking history who presented to [facility] for elective anterior tibial/posterior tibial revascularization.  Assessment & Plan    1. Critical limb ischemia: -Long standing hx of critical limb ischemia followed by Dr. Gwenlyn Found. He was recently seen in follow up with a non-healing wound after prior directional atherectomy with drug-coated balloon angioplasty of his right SFA was performed with attempted left SFA CTO 08/16/2019 which was unsuccessful. At follow up with Dr. Gwenlyn Found he continued to have a non-healing wound despite aggressive wound care therefore plan was to attempt tibial vessel  intervention  -Procedure performed 12/19/19 with Dr. Fletcher Anon with successful stent placement to the right common iliac artery into the external iliac artery. The  right anterior tibial artery was not amenable for revascularization as it does not reconstitute distally.  The patient seems to have excellent inline flow via a very well developed collaterals from the peroneal artery to the posterior tibial artery felt to have significant improvement in blood flow after treating his iliac disease. -Recommendations are for DAPT with ASA and Plavix with aggressive  treatment of risk factors.  2. Mixed HLD: -Continue atorvastatin  3. HTN: -Stable>>no change in regimen   4. Tobacco use: -Cessation strongly encouraged   Signed, Kathyrn Drown NP-C [facility] Pager: [phone] 12/20/2019, 7:13 AM     For questions or updates, please contact   Please consult [email or web address] for contact info under Cardiology/STEMI.  Personally seen and examined. Agree with above.   Doing well without any complaints.  No leg pain.  GEN: Thin in no acute distress  HEENT: normal  Neck: no JVD, carotid bruits, or masses Cardiac: RRR; no murmurs, rubs, or gallops,no edema  Respiratory:  clear to auscultation bilaterally, normal work of breathing GI: soft, nontender, nondistended, + BS MS: no deformity or  atrophy  Skin: Right foot wound dressed Neuro:  Alert and Oriented x 3, Strength and sensation are intact Psych: euthymic mood, full affect  Medications reviewed as above.  On dual antiplatelet therapy aspirin clopidogrel.  Peripheral vascular procedure reviewed.  1.  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  Full runoff was not performed. 2.  Right lower extremity: Severe calcified stenosis in the distal common iliac artery into the external iliac artery associated with 88 mm systolic gradient, patent SFA with mild nonobstructive disease, normal popliteal artery, one-vessel  runoff below the knee via the peroneal artery which has mild nonobstructive disease and reconstitutes the posterior distal artery to [facility] via a very well developed collateral.  The posterior tibial artery gives good flow to the whole pedal arch.  The anterior tibial artery is occluded with reconstitution distally. 3.  Successful drug-eluting stent placement to the right common iliac artery into the external iliac artery.  Recommendations: Right anterior tibial artery is not amenable for revascularization as it does not reconstitute distally.  The patient seems to have excellent inline flow via a very well developed collaterals from the peroneal artery to the posterior tibial artery.  The blood flow should improve significantly after treating his iliac disease. Continue dual antiplatelet therapy and aggressive treatment of risk factors.  Creatinine this morning 0.88, LDL 87, hemoglobin 11.7 prior to procedure  Assessment and plan:  Peripheral vascular disease: Ischemia -As described above.  Successful drug-eluting stent placement to the right common iliac artery into the external iliac artery. -Dual antiplatelet therapy. -Continue to encourage exercise.  Hyperlipidemia -Continue with high intensity statin.  LDL goal less than 70.  Tobacco use -Continue to counsel on cessation.  Essential hypertension with diabetes -Medications reviewed as above no changes made.  Maintain close clinic follow-up.  Candee Furbish, MD

## 2019-12-20 NOTE — Progress Notes (Signed)
Discharge instructions given to patient. Tele removed CCMD informed. PIV removed without difficulty. Patient instructed to dress and call for his ride home.

## 2019-12-20 NOTE — Discharge Summary (Addendum)
Discharge Summary    Patient ID: [name]. MRN: [id number]; DOB: 1960-02-19  Admit date: 12/19/2019 Discharge date: 12/20/2019  Primary Care Provider: Loretha Brasil, FNP (Inactive)  Primary Cardiologist: Ruben Field, MD /Dr. 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 Full runoff was not performed. 2. Right lower extremity: Severe calcified stenosis in the distal common iliac artery into the external iliac artery associated with 88 mm systolic gradient, patent SFA with mild nonobstructive disease, normal popliteal artery, one-vessel runoff below the knee via the peroneal artery which has mild nonobstructive disease and reconstitutes the posterior distal artery to [facility] via a very well developed collateral. The posterior tibial artery gives good flow to the whole pedal arch. The anterior tibial artery is occluded with reconstitution distally. 3. Successful drug-eluting stent placement to the right common iliac artery into the external iliac artery.  Recommendations: Right anterior tibial artery is not amenable for revascularization as it does not reconstitute distally. The patient seems to have excellent inline flow via a very well developed collaterals from the peroneal artery to the posterior tibial artery. The blood flow should improve significantly after treating his iliac disease. Continue dual antiplatelet therapy and aggressive treatment of risk factors.  History of Present Illness     [name]. is a 59 y.o. male with a history of PAD, hypertension, hyperlipidemia, type 2 diabetes mellitus, CKD and significant smoking history who presented to [facility] for elective anterior tibial/posterior tibial revascularization.  Hospital  Course     He has a hx of long standing hx of critical limb ischemia followed by Dr. Gwenlyn Huang. He was recently seen in follow up with a non-healing wound after prior directional atherectomy with drug-coated balloon angioplasty of his right SFA was performed with attempted left SFA CTO 08/16/2019 which was unsuccessful. At follow up with Dr. Gwenlyn Huang he continued to have a non-healing wound despite aggressive wound care therefore plan was to attempt tibial vessel intervention.   Procedure performed 12/19/19 with Dr. Fletcher Huang with successful stent placement to the right common iliac artery into the external iliac artery. The  right anterior tibial artery was not amenable for revascularization as it does not reconstitute distally. The patient seemed to have excellent inline flow via a very well developed collaterals from the peroneal artery to the posterior tibial artery felt to have significant improvement in blood flow after treating his iliac disease. Recommendations are for DAPT with ASA and Plavix with aggressive  treatment of risk factors.   The initial plan was for patient to be discharge for same day PCI however close to discharge time, patient had complications with his previously planned ride therefore he was admitted for overnight observation.  The patient was seen and examined by Dr. Marlou Huang who feels that he is stable and ready for discharge today, 12/20/2019.  He has follow-up already scheduled.  Other hospital problems include:  Mixed HLD: -Continue atorvastatin  HTN: -Stable>>no change in regimen   Tobacco use: -Cessation strongly encouraged  Consultants: None   Did the patient have an acute coronary syndrome (MI, NSTEMI, STEMI, etc) this admission?:  No                               Did  the patient have a percutaneous coronary intervention (stent / angioplasty)?:  No.       _____________  Discharge Vitals Blood pressure 137/80, pulse 78, temperature 98.9 F (37.2 C),  temperature source Oral, resp. rate 18, height _0  (1.575 m), weight 45.9 kg, SpO2 100 %.  Filed Weights   12/19/19 1054 12/19/19 2040 12/20/19 0047  Weight: 49.9 kg 46 kg 45.9 kg    Labs & Radiologic Studies    CBC No results for input(s): WBC, NEUTROABS, HGB, HCT, MCV, PLT in the last 72 hours. Basic Metabolic Panel Recent Labs    12/20/19 0239  NA 137  K 4.1  CL 105  CO2 25  GLUCOSE 100*  BUN 14  CREATININE 0.88  CALCIUM 9.4   Liver Function Tests No results for input(s): AST, ALT, ALKPHOS, BILITOT, PROT, ALBUMIN in the last 72 hours. No results for input(s): LIPASE, AMYLASE in the last 72 hours. High Sensitivity Troponin:   No results for input(s): TROPONINIHS in the last 720 hours.  BNP Invalid input(s): POCBNP D-Dimer No results for input(s): DDIMER in the last 72 hours. Hemoglobin A1C No results for input(s): HGBA1C in the last 72 hours. Fasting Lipid Panel No results for input(s): CHOL, HDL, LDLCALC, TRIG, CHOLHDL, LDLDIRECT in the last 72 hours. Thyroid Function Tests No results for input(s): TSH, T4TOTAL, T3FREE, THYROIDAB in the last 72 hours.  Invalid input(s): FREET3 _____________  PERIPHERAL VASCULAR CATHETERIZATION  Result Date: 12/19/2019 1.  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  Full runoff was not performed. 2.  Right lower extremity: Severe calcified stenosis in the distal common iliac artery into the external iliac artery associated with 88 mm systolic gradient, patent SFA with mild nonobstructive disease, normal popliteal artery, one-vessel runoff below the knee via the peroneal artery which has mild nonobstructive disease and reconstitutes the posterior distal artery to [facility] via a very well developed collateral.  The posterior tibial artery gives good flow to the whole pedal arch.  The anterior tibial artery is occluded with reconstitution distally. 3.  Successful drug-eluting stent placement to the right  common iliac artery into the external iliac artery. Recommendations: Right anterior tibial artery is not amenable for revascularization as it does not reconstitute distally.  The patient seems to have excellent inline flow via a very well developed collaterals from the peroneal artery to the posterior tibial artery.  The blood flow should improve significantly after treating his iliac disease. Continue dual antiplatelet therapy and aggressive treatment of risk factors.  Disposition   Pt is being discharged home today in good condition.  Follow-up Plans & Appointments    Follow-up Information    Lorretta Harp, MD Follow up on 12/28/2019.   Specialties: Cardiology, Radiology Why: at 1115am  Contact information: [address] [place] [address] [place] Follow up on 01/03/2020.   Specialty: Cardiology Why: at 0800 Contact information: [address] [place] [phone]       Geralynn Rile, MD Follow up on 01/16/2020.   Specialties: Internal Medicine, Cardiology, Radiology Why: at 0900  Contact information: [address] [place] [address] [phone]              Discharge Instructions    Call MD for:  difficulty breathing, headache or visual disturbances   Complete by: As directed    Call MD for:  extreme fatigue   Complete  by: As directed    Call MD for:  hives   Complete by: As directed    Call MD for:  persistant dizziness or light-headedness   Complete by: As directed    Call MD for:  persistant nausea and vomiting   Complete by: As directed    Call MD for:  redness, tenderness, or signs of infection (pain, swelling, redness, odor or green/yellow discharge around incision site)   Complete by: As directed    Call MD for:  severe uncontrolled pain   Complete by: As directed    Call MD for:  temperature >100.4   Complete by: As directed    Diet -  low sodium heart healthy   Complete by: As directed    If the dressing is still on your incision site when you go home, remove it on the third day after your surgery date. Remove dressing if it begins to fall off, or if it is dirty or damaged before the third day.   Complete by: As directed    Increase activity slowly   Complete by: As directed      Discharge Medications   Allergies as of 12/20/2019   No Known Allergies     Medication List    TAKE these medications   aspirin 81 MG EC tablet Take 1 tablet (81 mg total) by mouth daily. Swallow whole. What changed: when to take this   atorvastatin 80 MG tablet Commonly known as: LIPITOR Take 1 tablet (80 mg total) by mouth daily. What changed: when to take this   Blood Pressure Monitor Kit Check blood pressure daily   clopidogrel 75 MG tablet Commonly known as: PLAVIX Take 1 tablet (75 mg total) by mouth daily with breakfast.   gabapentin 300 MG capsule Commonly known as: NEURONTIN Take 300 mg by mouth in the morning, at noon, and at bedtime.   lisinopril-hydrochlorothiazide 20-12.5 MG tablet Commonly known as: ZESTORETIC Take 1 tablet by mouth daily.   Lubricant Eye Drops 0.5 % Soln Generic drug: carboxymethylcellulose Place 1 drop into both eyes 3 (three) times daily as needed (dry/irritated eyes).   Santyl ointment Generic drug: collagenase Apply 1 application topically daily as needed (wound care).   SSD 1 % cream Generic drug: silver sulfADIAZINE Apply 1 application topically daily as needed (wound care).   traMADol 50 MG tablet Commonly known as: ULTRAM Take 50 mg by mouth 3 (three) times daily as needed (pain).            Discharge Care Instructions  (From admission, onward)         Start     Ordered   12/20/19 0000  If the dressing is still on your incision site when you go home, remove it on the third day after your surgery date. Remove dressing if it begins to fall off, or if it is dirty or  damaged before the third day.        12/20/19 0928           Outstanding Labs/Studies   None   Duration of Discharge Encounter   Greater than 30 minutes including physician time.  Signed, Kathyrn Drown, NP 12/20/2019, 9:28 AM   Personally seen and examined. Agree with above.   Doing well without any complaints.  No leg pain.  GEN: Thin in no acute distress  HEENT: normal  Neck: no JVD, carotid bruits, or masses Cardiac: RRR; no murmurs, rubs, or gallops,no edema  Respiratory:  clear to auscultation  bilaterally, normal work of breathing GI: soft, nontender, nondistended, + BS MS: no deformity or atrophy  Skin: Right foot wound dressed Neuro:  Alert and Oriented x 3, Strength and sensation are intact Psych: euthymic mood, full affect  Medications reviewed as above.  On dual antiplatelet therapy aspirin clopidogrel.  Peripheral vascular procedure reviewed.  1. 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 Full runoff was not performed. 2. Right lower extremity: Severe calcified stenosis in the distal common iliac artery into the external iliac artery associated with 88 mm systolic gradient, patent SFA with mild nonobstructive disease, normal popliteal artery, one-vessel runoff below the knee via the peroneal artery which has mild nonobstructive disease and reconstitutes the posterior distal artery to [facility] via a very well developed collateral. The posterior tibial artery gives good flow to the whole pedal arch. The anterior tibial artery is occluded with reconstitution distally. 3. Successful drug-eluting stent placement to the right common iliac artery into the external iliac artery.  Recommendations: Right anterior tibial artery is not amenable for revascularization as it does not reconstitute distally. The patient seems to have excellent inline flow via a very well developed collaterals from the peroneal artery to the posterior  tibial artery. The blood flow should improve significantly after treating his iliac disease. Continue dual antiplatelet therapy and aggressive treatment of risk factors.  Creatinine this morning 0.88, LDL 87, hemoglobin 11.7 prior to procedure  Assessment and plan:  Peripheral vascular disease: Ischemia -As described above.  Successful drug-eluting stent placement to the right common iliac artery into the external iliac artery. -Dual antiplatelet therapy. -Continue to encourage exercise.  Hyperlipidemia -Continue with high intensity statin.  LDL goal less than 70.  Tobacco use -Continue to counsel on cessation.  Essential hypertension with diabetes -Medications reviewed as above no changes made.  Maintain close clinic follow-up.  Candee Furbish, MD

## 2019-12-20 NOTE — Plan of Care (Signed)

## 2019-12-21 ENCOUNTER — Encounter (HOSPITAL_COMMUNITY): Payer: Self-pay | Admitting: Cardiovascular Disease

## 2019-12-27 ENCOUNTER — Other Ambulatory Visit: Payer: Self-pay

## 2019-12-27 ENCOUNTER — Encounter (HOSPITAL_BASED_OUTPATIENT_CLINIC_OR_DEPARTMENT_OTHER): Payer: Medicare Other | Attending: Internal Medicine | Admitting: Internal Medicine

## 2019-12-27 DIAGNOSIS — Z992 Dependence on renal dialysis: Secondary | ICD-10-CM | POA: Insufficient documentation

## 2019-12-27 DIAGNOSIS — L97512 Non-pressure chronic ulcer of other part of right foot with fat layer exposed: Secondary | ICD-10-CM | POA: Diagnosis not present

## 2019-12-27 DIAGNOSIS — E1122 Type 2 diabetes mellitus with diabetic chronic kidney disease: Secondary | ICD-10-CM | POA: Diagnosis not present

## 2019-12-27 DIAGNOSIS — N186 End stage renal disease: Secondary | ICD-10-CM | POA: Insufficient documentation

## 2019-12-27 DIAGNOSIS — E11621 Type 2 diabetes mellitus with foot ulcer: Secondary | ICD-10-CM | POA: Insufficient documentation

## 2019-12-27 DIAGNOSIS — I120 Hypertensive chronic kidney disease with stage 5 chronic kidney disease or end stage renal disease: Secondary | ICD-10-CM | POA: Diagnosis not present

## 2019-12-27 DIAGNOSIS — E1140 Type 2 diabetes mellitus with diabetic neuropathy, unspecified: Secondary | ICD-10-CM | POA: Diagnosis not present

## 2019-12-27 DIAGNOSIS — E1151 Type 2 diabetes mellitus with diabetic peripheral angiopathy without gangrene: Secondary | ICD-10-CM | POA: Insufficient documentation

## 2019-12-27 NOTE — Progress Notes (Signed)
CHEROKEE, CLOWERS ([id number]) Visit Report for 12/27/2019 HPI Details Patient Name: Date of Service: [name] 12/27/2019 9:45 A M Medical Record Number: [id number] Patient Account Number: [id number] Date of Birth/Sex: Treating RN: 1960/10/01 (59 y.o. [name] Primary Care Provider: [name] Other Clinician: Referring Provider: Treating Provider/Extender: [name] in Treatment: 19 History of Present Illness HPI Description: 08/15/2019 upon evaluation today patient presents for evaluation here in our clinic concerning issues that he is having with wounds on the dorsal surface of his right foot. This is something that was noted to occur after he was obtaining cortisone injections with [facility]. Subsequently he developed ulcerations he was then referred to Dr. Alvester Chou who performed arterial testing and subsequently found that the patient had poor arterial flow with an ABI of 0.6 and a TBI of 0.26. With that being said he has since on 07/16/2019 had an angiogram on the right with improvement of his ABI to 0.85 and TBI to 0.5. Obviously this is not perfect but is definitely far superior to where it was previous. He does have some necrotic tissue on the surface of the wounds is going to need debriding away he has been using Silvadene on this but to be honest that is not can to do anything for these wounds. He needs something more to clear this away sharp debridement is ideal and we may even look towards Santyl as well. The patient does have a history of diabetes noted in his chart though is not on medication and his A1c was around 6.4 so is not terrible. With that being said he does have known peripheral vascular disease he is actually have an angiogram on the left tomorrow. He also has hypertension and he does smoke. 8/27; this is a patient has been here once before about 6 weeks ago. Since then he was hospitalized from 7/22 through 7/23 by  Dr. Gwenlyn Found for an attempt at revascularization. He had previously been seen by Dr. Amalia Hailey of podiatry. He is a continued tobacco smoker. His angiogram on 6/21 via revealed a occluded SFAs bilaterally with one-vessel runoff. He had a atherectomy followed by a drug-coated balloon angioplasty of the right SFA. 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 He had some improvement in his right ABI from 0.6-0.85. There is also an attempt to open his left SFA but that was unsuccessful. He does not have a wound on the left side. We are using Santyl on the patient's wounds on the right dorsal foot. He says he has about a 1 minute exercise tolerance before stopping because of pain compatible with claudication 9/10; we are using Santyl on the wound on the right dorsal foot. Still has significant claudication symptoms. He is status post angioplasty of the right SFA and atherectomy. He has one-vessel runoff via a diseased peroneal. He follows up with Dr. Gwenlyn Found on 9/14 9/17; I had my notes from last week that the patient was to see Dr. Gwenlyn Found on 9/14 he tells me that the appointment is actually on 9/21 although I do not see that in epic. The next appointment I see with Dr. Gwenlyn Found is on 11/17. Nevertheless the patient is fairly adamant that his appointment is early next week I told him to call ahead before he goes. The wound is a lot better we have been using Santyl over that he just ran out of that all represcribe it. 10/1; patient  arrives today with nothing on his wound. He says he has been using the Santyl but he feels the border foam is too painful. We told him he needs to put a covering on this of some sort perhaps gauze. Indeed after our discussion last time his appointment with Dr. Gwenlyn Found 11/17. Previously he has had a atherectomy and an angioplasty of the right SFA. I note in my previous notes it was said he had bilateral one-vessel runoff I'll need to review the tibial vessels on the  angiogram. The patient is currently rating his pain at 8 out of 10 He followed up with Dr. Amalia Hailey at [facility] on 9/27. He emphasized to follow-up with vascular and [place]. Follow-up with them as needed. 10/15; patient ran out of Santyl and has been using Silvadene. He still has constant pain which I think is largely claudication. I have looked over his angiogram results. I need to communicate with Dr. Gwenlyn Found to see if anything else can be done here. He arrives in clinic today with tendon over most of the wound surface area 10/29; we are using silver collagen on the right foot. Still complaining of a lot of pain especially at night. Dr. Gwenlyn Found is out of town this week I will send him an email but he has a follow-up appointment on 11/17. 11/18; patient saw Dr. Gwenlyn Found yesterday. He has a history of a directional atherectomy followed by a drug-coated balloon angioplasty on the right SFA. 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 His right ABI increased from 0.6 2.85 however the right foot wound has not budged. He is going next Wednesday for an attempt to do tibial artery interventions. He mentions that he has one-vessel runoff via the peroneal.. They are going to attempt to look at the anterior tibial and posterior tibial revascularization next Wednesday. If they were successful at this then I will consider debridement and changing the topical dressings to this wound. Patient is still in a lot of pain 12/2; the patient underwent revascularization by Dr. Fletcher Anon on 12/19/2019. 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 The posterior tibial artery gives good flow into the whole pedal arch getting collaterals from the peroneal artery the anterior tibial artery was  occluded. Unfortunately his right anterior tibial artery is not amenable for revascularization. He had a drug-eluting stent placed in the right common iliac artery into the external iliac artery. The patient arrives saying his pain is about the same. He still has exposed tendon in the base of the wound however there is some healthy looking granulation at the circumference. Perhaps reason for some guarded optimism. Comes in today not wanting debridement asking for pain medication Electronic Signature(s) Signed: 12/27/2019 12:52:04 PM By: Linton Ham MD Entered By: Linton Ham on 12/27/2019 11:07:57 -------------------------------------------------------------------------------- Physical Exam Details Patient Name: Date of Service: [name]. 12/27/2019 9:45 A M Medical Record Number: [id number] Patient Account Number: [id number] Date of Birth/Sex: Treating RN: 06-21-1960 (59 y.o. [name] Primary Care Provider: [name] Other Clinician: Referring Provider: Treating Provider/Extender: Domenic Polite Weeks in Treatment: 19 Constitutional Sitting or standing Blood Pressure is within target range for patient.. Pulse regular and within target range for patient.Marland Kitchen Respirations regular, non-labored and  within target range.. Temperature is normal and within the target range for the patient.Marland Kitchen Appears in no distress. Cardiovascular Popliteal pulses not palpable. Right dorsalis pedis and posterior tibial arteries have no palpable pulse however generally his foot is warmer. Notes Wound exam; dorsal right foot. He has still some nonviable circumference and some exposed tendon. No debridement was done. Around the circumference there is certainly some better looking granulation tissue. There is no evidence of surrounding infection Electronic Signature(s) Signed: 12/27/2019 12:52:04 PM By: Linton Ham MD Entered By: Linton Ham on 12/27/2019  11:09:18 -------------------------------------------------------------------------------- Physician Orders Details Patient Name: Date of Service: [name] 12/27/2019 9:45 A M Medical Record Number: [id number] Patient Account Number: [id number] Date of Birth/Sex: Treating RN: 04/17/1960 (59 y.o. [name] Primary Care Provider: [name] Other Clinician: Referring Provider: Treating Provider/Extender: [name] in Treatment: 19 Verbal / Phone Orders: No Diagnosis Coding ICD-10 Coding Code Description I73.89 Other specified peripheral vascular diseases E11.621 Type 2 diabetes mellitus with foot ulcer L97.512 Non-pressure chronic ulcer of other part of right foot with fat layer exposed I10 Essential (primary) hypertension F17.218 Nicotine dependence, cigarettes, with other nicotine-induced disorders Follow-up Appointments Return Appointment in 2 weeks. Bathing/ Shower/ Hygiene May shower and wash wound with soap and water. - with dressing changes. Edema Control - Lymphedema / SCD / Other Elevate legs to the level of the heart or above for 30 minutes daily and/or when sitting, a frequency of: - 3-4 times a throughout the day. Avoid standing for long periods of time. Off-Loading Wound #1 Right,Dorsal Foot Open toe surgical shoe to: - ensure no pressure to top of foot. Wound Treatment Wound #1 - Foot Wound Laterality: Dorsal, Right Prim Dressing: Promogran Prisma Matrix, 4.34 (sq in) (silver collagen) Every Other Day/30 Days ary Discharge Instructions: Apply silver collagen to wound bed as instructed. moisten with hydrogel or KJ jelly. Secondary Dressing: ComfortFoam Border, 3x3 in (silicone border) Every Other Day/30 Days Discharge Instructions: Apply over primary dressing as directed. Electronic Signature(s) Signed: 12/27/2019 12:52:04 PM By: Linton Ham MD Signed: 12/27/2019 5:54:37 PM By: Deon Pilling Entered By: Deon Pilling on 12/27/2019 11:03:45 -------------------------------------------------------------------------------- Problem List Details Patient Name: Date of Service: [name], [name] 12/27/2019 9:45 A M Medical Record Number: [id number] Patient Account Number: [id number] Date of Birth/Sex: Treating RN: 05-13-1960 (59 y.o. [name] Primary Care Provider: [name] Other Clinician: Referring Provider: Treating Provider/Extender: Domenic Polite Weeks in Treatment: 19 Active Problems ICD-10 Encounter Code Description Active Date MDM Diagnosis I73.89 Other specified peripheral vascular diseases 08/15/2019 No Yes E11.621 Type 2 diabetes mellitus with foot ulcer 08/15/2019 No Yes L97.512 Non-pressure chronic ulcer of other part of right foot with fat layer exposed 08/15/2019 No Yes I10 Essential (primary) hypertension 08/15/2019 No Yes F17.218 Nicotine dependence, cigarettes, with other nicotine-induced disorders 08/15/2019 No Yes Inactive Problems Resolved Problems Electronic Signature(s) Signed: 12/27/2019 12:52:04 PM By: Linton Ham MD Entered By: Linton Ham on 12/27/2019 11:05:32 -------------------------------------------------------------------------------- Progress Note Details Patient Name: Date of Service: [name] 12/27/2019 9:45 A M Medical Record Number: [id number] Patient Account Number: [id number] Date of Birth/Sex: Treating RN: 30-Mar-1960 (59 y.o. [name] Primary Care Provider: [name] Other Clinician: Referring Provider: Treating Provider/Extender: Domenic Polite Weeks in Treatment: 19 Subjective History of Present Illness (HPI) 08/15/2019 upon evaluation today patient presents for evaluation here in our clinic concerning issues that he is having  with wounds on the dorsal surface of his right foot. This is something that was noted to occur after he was obtaining cortisone injections with  [facility]. Subsequently he developed ulcerations he was then referred to Dr. Alvester Chou who performed arterial testing and subsequently found that the patient had poor arterial flow with an ABI of 0.6 and a TBI of 0.26. With that being said he has since on 07/16/2019 had an angiogram on the right with improvement of his ABI to 0.85 and TBI to 0.5. Obviously this is not perfect but is definitely far superior to where it was previous. He does have some necrotic tissue on the surface of the wounds is going to need debriding away he has been using Silvadene on this but to be honest that is not can to do anything for these wounds. He needs something more to clear this away sharp debridement is ideal and we may even look towards Santyl as well. The patient does have a history of diabetes noted in his chart though is not on medication and his A1c was around 6.4 so is not terrible. With that being said he does have known peripheral vascular disease he is actually have an angiogram on the left tomorrow. He also has hypertension and he does smoke. 8/27; this is a patient has been here once before about 6 weeks ago. Since then he was hospitalized from 7/22 through 7/23 by Dr. Gwenlyn Found for an attempt at revascularization. He had previously been seen by Dr. Amalia Hailey of podiatry. He is a continued tobacco smoker. His angiogram on 6/21 via revealed a occluded SFAs bilaterally with one-vessel runoff. He had a atherectomy followed by a drug-coated balloon angioplasty of the right SFA. 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 He had some improvement in his right ABI from 0.6-0.85. There is also an attempt to open his left SFA but that was unsuccessful. He does not have a wound on the left side. We are using Santyl on the patient's wounds on the right dorsal foot. He says he has about a 1 minute exercise tolerance before stopping because of pain compatible with claudication 9/10; we are using  Santyl on the wound on the right dorsal foot. Still has significant claudication symptoms. He is status post angioplasty of the right SFA and atherectomy. He has one-vessel runoff via a diseased peroneal. He follows up with Dr. Gwenlyn Found on 9/14 9/17; I had my notes from last week that the patient was to see Dr. Gwenlyn Found on 9/14 he tells me that the appointment is actually on 9/21 although I do not see that in epic. The next appointment I see with Dr. Gwenlyn Found is on 11/17. Nevertheless the patient is fairly adamant that his appointment is early next week I told him to call ahead before he goes. The wound is a lot better we have been using Santyl over that he just ran out of that all represcribe it. 10/1; patient arrives today with nothing on his wound. He says he has been using the Santyl but he feels the border foam is too painful. We told him he needs to put a covering on this of some sort perhaps gauze. Indeed after our discussion last time his appointment with Dr. Gwenlyn Found 11/17. Previously he has had a atherectomy and an angioplasty of the right SFA. I note in my previous notes it was said he had bilateral one-vessel runoff I'll need to review the tibial vessels on  the angiogram. The patient is currently rating his pain at 8 out of 10 He followed up with Dr. Amalia Hailey at [facility] on 9/27. He emphasized to follow-up with vascular and [place]. Follow-up with them as needed. 10/15; patient ran out of Santyl and has been using Silvadene. He still has constant pain which I think is largely claudication. I have looked over his angiogram results. I need to communicate with Dr. Gwenlyn Found to see if anything else can be done here. He arrives in clinic today with tendon over most of the wound surface area 10/29; we are using silver collagen on the right foot. Still complaining of a lot of pain especially at night. Dr. Gwenlyn Found is out of town this week I will send him an email but he has a follow-up appointment on  11/17. 11/18; patient saw Dr. Gwenlyn Found yesterday. He has a history of a directional atherectomy followed by a drug-coated balloon angioplasty on the right SFA. 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 His right ABI increased from 0.6 2.85 however the right foot wound has not budged. He is going next Wednesday for an attempt to do tibial artery interventions. He mentions that he has one-vessel runoff via the peroneal.. They are going to attempt to look at the anterior tibial and posterior tibial revascularization next Wednesday. If they were successful at this then I will consider debridement and changing the topical dressings to this wound. Patient is still in a lot of pain 12/2; the patient underwent revascularization by Dr. Fletcher Anon on 12/19/2019. 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 The posterior tibial artery gives good flow into the whole pedal arch getting collaterals from the peroneal artery the anterior tibial artery was occluded. Unfortunately his right anterior tibial artery is not amenable for revascularization. He had a drug-eluting stent placed in the right common iliac artery into the external iliac artery. The patient arrives saying his pain is about the same. He still has exposed tendon in the base of the wound however there is some healthy looking granulation at the circumference. Perhaps reason for some guarded optimism. Comes in today not wanting debridement asking for pain medication Objective Constitutional Sitting or standing Blood Pressure is within target range for patient.. Pulse regular and within target range for patient.Marland Kitchen Respirations regular, non-labored and within target range.. Temperature is normal and within the target range  for the patient.Marland Kitchen Appears in no distress. Vitals Time Taken: 10:17 AM, Height: 62 in, Weight: 130 lbs, BMI: 23.8, Temperature: 97.7 F, Pulse: 85 bpm, Respiratory Rate: 17 breaths/min, Blood Pressure: 114/77 mmHg. Cardiovascular Popliteal pulses not palpable. Right dorsalis pedis and posterior tibial arteries have no palpable pulse however generally his foot is warmer. General Notes: Wound exam; dorsal right foot. He has still some nonviable circumference and some exposed tendon. No debridement was done. Around the circumference there is certainly some better looking granulation tissue. There is no evidence of surrounding infection Integumentary (Hair, Skin) Wound #1 status is Open. Original cause of wound was Gradually Appeared. The wound is located on the Right,Dorsal Foot. The wound measures 1.3cm length x 2.1cm width x 0.3cm depth; 2.144cm^2 area and 0.643cm^3 volume. There is tendon exposed. There is no tunneling or undermining noted. There is a large amount  of serosanguineous drainage noted. The wound margin is flat and intact. There is small (1-33%) red, pink granulation within the wound bed. There is a large (67-100%) amount of necrotic tissue within the wound bed including Eschar and Adherent Slough. Assessment Active Problems ICD-10 Other specified peripheral vascular diseases Type 2 diabetes mellitus with foot ulcer Non-pressure chronic ulcer of other part of right foot with fat layer exposed Essential (primary) hypertension Nicotine dependence, cigarettes, with other nicotine-induced disorders Plan Follow-up Appointments: Return Appointment in 2 weeks. Bathing/ Shower/ Hygiene: May shower and wash wound with soap and water. - with dressing changes. Edema Control - Lymphedema / SCD / Other: Elevate legs to the level of the heart or above for 30 minutes daily and/or when sitting, a frequency of: - 3-4 times a throughout the day. Avoid standing for long periods of  time. Off-Loading: Wound #1 Right,Dorsal Foot: Open toe surgical shoe to: - ensure no pressure to top of foot. WOUND #1: - Foot Wound Laterality: Dorsal, Right Prim Dressing: Promogran Prisma Matrix, 4.34 (sq in) (silver collagen) Every Other Day/30 Days ary Discharge Instructions: Apply silver collagen to wound bed as instructed. moisten with hydrogel or KJ jelly. Secondary Dressing: ComfortFoam Border, 3x3 in (silicone border) Every Other Day/30 Days Discharge Instructions: Apply over primary dressing as directed. #1 I am continue with the silver collagen which will keep the tendon moist and perhaps promote the granulation that is present. 2. I talked to him about continuing cigarette smoking. He is not a diabetic 3. In spite of the efforts of Dr. Fletcher Anon with iliac artery stenting I am not that optimistic however we will have to see how this goes over time Electronic Signature(s) Signed: 12/27/2019 12:52:04 PM By: Linton Ham MD Entered By: Linton Ham on 12/27/2019 11:10:56 -------------------------------------------------------------------------------- SuperBill Details Patient Name: Date of Service: [name], [name] 12/27/2019 Medical Record Number: [id number] Patient Account Number: [id number] Date of Birth/Sex: Treating RN: 1960/04/02 (59 y.o. [name] Primary Care Provider: [name] Other Clinician: Referring Provider: Treating Provider/Extender: Domenic Polite Weeks in Treatment: 19 Diagnosis Coding ICD-10 Codes Code Description I73.89 Other specified peripheral vascular diseases E11.621 Type 2 diabetes mellitus with foot ulcer L97.512 Non-pressure chronic ulcer of other part of right foot with fat layer exposed I10 Essential (primary) hypertension F17.218 Nicotine dependence, cigarettes, with other nicotine-induced disorders Facility Procedures CPT4 Code: [id number] Description: 99213 - WOUND CARE VISIT-LEV 3 EST  PT Modifier: Quantity: 1 Physician Procedures : CPT4 Code Description Modifier [id number] - WC PHYS LEVEL 3 - EST PT ICD-10 Diagnosis Description L97.512 Non-pressure chronic ulcer of other part of right foot with fat layer exposed E11.621 Type 2 diabetes mellitus with foot ulcer I73.89 Other  specified peripheral vascular diseases Quantity: 1 Electronic Signature(s) Signed: 12/27/2019 12:52:04 PM By: Linton Ham MD Signed: 12/27/2019 5:54:37 PM By: Deon Pilling Entered By: Deon Pilling on 12/27/2019 11:32:05

## 2019-12-28 ENCOUNTER — Ambulatory Visit: Payer: Medicare Other | Admitting: Cardiovascular Disease

## 2019-12-28 NOTE — Progress Notes (Signed)
[name] ([id number]) Visit Report for 12/27/2019 Arrival Information Details Patient Name: Date of Service: [name] 12/27/2019 9:45 A M Medical Record Number: [id number] Patient Account Number: [id number] Date of Birth/Sex: Treating RN: [birth year] (59 y.o. Burnadette Pop, Lauren Primary Care Sheleen Conchas: [name] Other Clinician: Referring Lola Lofaro: Treating Clerance Umland/Extender: [name] in Treatment: 46 Visit Information History Since Last Visit Added or deleted any medications: No Patient Arrived: Ambulatory Any new allergies or adverse reactions: No Arrival Time: 10:17 Had a fall or experienced change in No Accompanied By: self activities of daily living that may affect Transfer Assistance: None risk of falls: Patient Identification Verified: Yes Signs or symptoms of abuse/neglect since last visito No Secondary Verification Process Completed: Yes Hospitalized since last visit: No Patient Requires Transmission-Based Precautions: No Implantable device outside of the clinic excluding No Patient Has Alerts: Yes cellular tissue based products placed in the center Patient Alerts: Patient on Blood Thinner since last visit: R ABI: 0.85 R TBI: 0.50 Has Dressing in Place as Prescribed: Yes Pain Present Now: Yes Electronic Signature(s) Signed: 12/27/2019 5:27:06 PM By: Rhae Hammock RN Entered By: Rhae Hammock on 12/27/2019 10:18:09 -------------------------------------------------------------------------------- Clinic Level of Care Assessment Details Patient Name: Date of Service: Ruben RD, [name] 12/27/2019 9:45 A M Medical Record Number: [id number] Patient Account Number: [id number] Date of Birth/Sex: Treating RN: [birth year] (59 y.o. Lorette Ang, Meta.Reding Primary Care Jordana Dugue: [name] Other Clinician: Referring Tashawna Thom: Treating Garion Wempe/Extender: [name] in Treatment: 19 Clinic  Level of Care Assessment Items TOOL 4 Quantity Score X- 1 0 Use when only an EandM is performed on FOLLOW-UP visit ASSESSMENTS - Nursing Assessment / Reassessment X- 1 10 Reassessment of Co-morbidities (includes updates in patient status) X- 1 5 Reassessment of Adherence to Treatment Plan ASSESSMENTS - Wound and Skin A ssessment / Reassessment X - Simple Wound Assessment / Reassessment - one wound 1 5 '[]'  - 0 Complex Wound Assessment / Reassessment - multiple wounds X- 1 10 Dermatologic / Skin Assessment (not related to wound area) ASSESSMENTS - Focused Assessment X- 1 5 Circumferential Edema Measurements - multi extremities X- 1 10 Nutritional Assessment / Counseling / Intervention '[]'  - 0 Lower Extremity Assessment (monofilament, tuning fork, pulses) '[]'  - 0 Peripheral Arterial Disease Assessment (using hand held doppler) ASSESSMENTS - Ostomy and/or Continence Assessment and Care '[]'  - 0 Incontinence Assessment and Management '[]'  - 0 Ostomy Care Assessment and Management (repouching, etc.) PROCESS - Coordination of Care X - Simple Patient / Family Education for ongoing care 1 15 '[]'  - 0 Complex (extensive) Patient / Family Education for ongoing care '[]'  - 0 Staff obtains Programmer, systems, Records, T Results / Process Orders est '[]'  - 0 Staff telephones HHA, Nursing Homes / Clarify orders / etc '[]'  - 0 Routine Transfer to another Facility (non-emergent condition) '[]'  - 0 Routine Hospital Admission (non-emergent condition) '[]'  - 0 New Admissions / Biomedical engineer / Ordering NPWT Apligraf, etc. , '[]'  - 0 Emergency Hospital Admission (emergent condition) X- 1 10 Simple Discharge Coordination '[]'  - 0 Complex (extensive) Discharge Coordination PROCESS - Special Needs '[]'  - 0 Pediatric / Minor Patient Management '[]'  - 0 Isolation Patient Management '[]'  - 0 Hearing / Language / Visual special needs '[]'  - 0 Assessment of Community assistance (transportation, D/C planning,  etc.) '[]'  - 0 Additional assistance / Altered mentation '[]'  - 0 Support Surface(s) Assessment (bed, cushion, seat, etc.) INTERVENTIONS - Wound Cleansing / Measurement  X - Simple Wound Cleansing - one wound 1 5 '[]'  - 0 Complex Wound Cleansing - multiple wounds X- 1 5 Wound Imaging (photographs - any number of wounds) '[]'  - 0 Wound Tracing (instead of photographs) X- 1 5 Simple Wound Measurement - one wound '[]'  - 0 Complex Wound Measurement - multiple wounds INTERVENTIONS - Wound Dressings X - Small Wound Dressing one or multiple wounds 1 10 '[]'  - 0 Medium Wound Dressing one or multiple wounds '[]'  - 0 Large Wound Dressing one or multiple wounds '[]'  - 0 Application of Medications - topical '[]'  - 0 Application of Medications - injection INTERVENTIONS - Miscellaneous '[]'  - 0 External ear exam '[]'  - 0 Specimen Collection (cultures, biopsies, blood, body fluids, etc.) '[]'  - 0 Specimen(s) / Culture(s) sent or taken to Lab for analysis '[]'  - 0 Patient Transfer (multiple staff / Civil Service fast streamer / Similar devices) '[]'  - 0 Simple Staple / Suture removal (25 or less) '[]'  - 0 Complex Staple / Suture removal (26 or more) '[]'  - 0 Hypo / Hyperglycemic Management (close monitor of Blood Glucose) '[]'  - 0 Ankle / Brachial Index (ABI) - do not check if billed separately X- 1 5 Vital Signs Has the patient been seen at the hospital within the last three years: Yes Total Score: 100 Level Of Care: New/Established - Level 3 Electronic Signature(s) Signed: 12/27/2019 5:54:37 PM By: Deon Pilling Entered By: Deon Pilling on 12/27/2019 11:31:49 -------------------------------------------------------------------------------- Complex / Palliative Patient Assessment Details Patient Name: Date of Service: Ruben Shell. 12/27/2019 9:45 A M Medical Record Number: [id number] Patient Account Number: [id number] Date of Birth/Sex: Treating RN: Dec 08, 1960 (59 y.o. Janyth Contes Primary Care Nickolis Diel: [name] Other Clinician: Referring Viyaan Champine: Treating Maelle Sheaffer/Extender: [name] in Treatment: 19 Palliative Management Criteria Complex Wound Management Criteria Patient has remarkable or complex co-morbidities requiring medications or treatments that extend wound healing times. Examples: Diabetes mellitus with chronic renal failure or end stage renal disease requiring dialysis Advanced or poorly controlled rheumatoid arthritis Diabetes mellitus and end stage chronic obstructive pulmonary disease Active cancer with current chemo- or radiation therapy Type 2 Diabetes, PAD, PVD, Smoker Care Approach Wound Care Plan: Complex Wound Management Electronic Signature(s) Signed: 12/27/2019 5:09:23 PM By: Linton Ham MD Signed: 12/28/2019 4:47:23 PM By: Levan Hurst RN, BSN Entered By: Levan Hurst on 12/27/2019 15:26:47 -------------------------------------------------------------------------------- Encounter Discharge Information Details Patient Name: Date of Service: [name] 12/27/2019 9:45 A M Medical Record Number: [id number] Patient Account Number: [id number] Date of Birth/Sex: Treating RN: 1960/12/26 (59 y.o. Burnadette Pop, Lauren Primary Care Gabryel Files: [name] Other Clinician: Referring Geniece Akers: Treating Runette Scifres/Extender: [name] in Treatment: 19 Encounter Discharge Information Items Discharge Condition: Stable Ambulatory Status: Ambulatory Discharge Destination: Home Transportation: Private Auto Accompanied By: self Schedule Follow-up Appointment: Yes Clinical Summary of Care: Patient Declined Electronic Signature(s) Signed: 12/27/2019 4:30:16 PM By: Rhae Hammock RN Entered By: Rhae Hammock on 12/27/2019 16:30:15 -------------------------------------------------------------------------------- Lower Extremity Assessment Details Patient Name: Date of Service: Ruben Shell. 12/27/2019 9:45 A M Medical Record Number: [id number] Patient Account Number: [id number] Date of Birth/Sex: Treating RN: 05/11/1960 (59 y.o. Burnadette Pop, Lauren Primary Care Lennon Richins: [name] Other Clinician: Referring Jacobi Ryant: Treating Masey Scheiber/Extender: Domenic Polite Weeks in Treatment: 19 Edema Assessment Assessed: Shirlyn Goltz: No] Patrice Paradise: No] Edema: [Left: N] [Right: o] Calf Left: Right: Point of Measurement: 28 cm From Medial Instep 29 cm Ankle Left:  Right: Point of Measurement: 9 cm From Medial Instep 18 cm Vascular Assessment Pulses: Dorsalis Pedis Palpable: [Right:Yes] Posterior Tibial Palpable: [Right:Yes] Electronic Signature(s) Signed: 12/27/2019 5:27:06 PM By: Rhae Hammock RN Entered By: Rhae Hammock on 12/27/2019 10:15:35 -------------------------------------------------------------------------------- [place] Details Patient Name: Date of Service: [name], [name] 12/27/2019 9:45 A M Medical Record Number: [id number] Patient Account Number: [id number] Date of Birth/Sex: Treating RN: 1960/03/16 (59 y.o. [name] Primary Care Ruperto Kiernan: [name] Other Clinician: Referring Jakavion Bilodeau: Treating Jacai Kipp/Extender: [name] in Treatment: 19 Active Inactive Nutrition Nursing Diagnoses: Impaired glucose control: actual or potential Potential for alteratiion in Nutrition/Potential for imbalanced nutrition Goals: Patient/caregiver will maintain therapeutic glucose control Date Initiated: 08/15/2019 Target Resolution Date: 01/25/2020 Goal Status: Active Interventions: Assess HgA1c results as ordered upon admission and as needed Assess patient nutrition upon admission and as needed per policy Treatment Activities: Patient referred to Primary Care Physician for further nutritional evaluation : 08/15/2019 Notes: Wound/Skin Impairment Nursing Diagnoses: Impaired  tissue integrity Knowledge deficit related to smoking impact on wound healing Knowledge deficit related to ulceration/compromised skin integrity Goals: Patient will demonstrate a reduced rate of smoking or cessation of smoking Date Initiated: 08/15/2019 Target Resolution Date: 01/25/2020 Goal Status: Active Patient/caregiver will verbalize understanding of skin care regimen Date Initiated: 08/15/2019 Date Inactivated: 12/27/2019 Target Resolution Date: 12/28/2019 Goal Status: Met Ulcer/skin breakdown will have a volume reduction of 30% by week 4 Date Initiated: 08/15/2019 Date Inactivated: 09/21/2019 Target Resolution Date: 09/12/2019 Goal Status: Met Interventions: Assess patient/caregiver ability to obtain necessary supplies Assess patient/caregiver ability to perform ulcer/skin care regimen upon admission and as needed Assess ulceration(s) every visit Provide education on ulcer and skin care Treatment Activities: Skin care regimen initiated : 08/15/2019 Topical wound management initiated : 08/15/2019 Notes: Electronic Signature(s) Signed: 12/27/2019 5:54:37 PM By: Deon Pilling Entered By: Deon Pilling on 12/27/2019 11:00:37 -------------------------------------------------------------------------------- Pain Assessment Details Patient Name: Date of Service: Ruben Shell 12/27/2019 9:45 A M Medical Record Number: [id number] Patient Account Number: [id number] Date of Birth/Sex: Treating RN: [birth year] (59 y.o. Burnadette Pop, Lauren Primary Care Saidee Geremia: [name] Other Clinician: Referring Laquan Beier: Treating Keil Pickering/Extender: [name] in Treatment: 19 Active Problems Location of Pain Severity and Description of Pain Patient Has Paino Yes Site Locations Pain Location: Pain Location: Pain in Ulcers With Dressing Change: Yes Duration of the Pain. Constant / Intermittento Intermittent Rate the pain. Current Pain Level: 2 Worst  Pain Level: 10 Least Pain Level: 0 Tolerable Pain Level: 7 Character of Pain Describe the Pain: Aching Pain Management and Medication Current Pain Management: Medication: Yes Cold Application: No Rest: Yes Massage: No Activity: No T.E.N.S.: No Heat Application: No Leg drop or elevation: No Is the Current Pain Management Adequate: Adequate How does your wound impact your activities of daily livingo Sleep: No Bathing: No Appetite: No Relationship With Others: No Bladder Continence: No Emotions: No Bowel Continence: No Work: No Toileting: No Drive: No Dressing: No Hobbies: No Electronic Signature(s) Signed: 12/27/2019 5:27:06 PM By: Rhae Hammock RN Entered By: Rhae Hammock on 12/27/2019 10:17:09 -------------------------------------------------------------------------------- Patient/Caregiver Education Details Patient Name: Date of Service: Ruben Shell 12/2/2021andnbsp9:45 A M Medical Record Number: [id number] Patient Account Number: [id number] Date of Birth/Gender: Treating RN: [birth year] (59 y.o. [name] Primary Care Physician: [name] Other Clinician: Referring Physician: Treating Physician/Extender: [name] in Treatment: 19 Education Assessment Education Provided To: Patient Education  Topics Provided Wound/Skin Impairment: Handouts: Skin Care Do's and Dont's Methods: Explain/Verbal Responses: Reinforcements needed Electronic Signature(s) Signed: 12/27/2019 5:54:37 PM By: Deon Pilling Signed: 12/27/2019 5:54:37 PM By: Deon Pilling Entered By: Deon Pilling on 12/27/2019 11:00:49 -------------------------------------------------------------------------------- Wound Assessment Details Patient Name: Date of Service: Ruben Shell. 12/27/2019 9:45 A M Medical Record Number: [id number] Patient Account Number: [id number] Date of Birth/Sex: Treating RN: [birth year] (59 y.o. Burnadette Pop,  Lauren Primary Care Merric Yost: [name] Other Clinician: Referring Teleah Villamar: Treating Doloris Servantes/Extender: Domenic Polite Weeks in Treatment: 19 Wound Status Wound Number: 1 Primary Diabetic Wound/Ulcer of the Lower Extremity Etiology: Wound Location: Right, Dorsal Foot Secondary Arterial Insufficiency Ulcer Wounding Event: Gradually Appeared Etiology: Date Acquired: 03/26/2019 Wound Status: Open Weeks Of Treatment: 19 Comorbid Hypertension, Peripheral Arterial Disease, Type II Diabetes, Clustered Wound: No History: Neuropathy Photos Photo Uploaded By: Mikeal Hawthorne on 12/28/2019 15:42:06 Wound Measurements Length: (cm) 1.3 Width: (cm) 2.1 Depth: (cm) 0.3 Area: (cm) 2.144 Volume: (cm) 0.643 % Reduction in Area: 24.2% % Reduction in Volume: -127.2% Epithelialization: Small (1-33%) Tunneling: No Undermining: No Wound Description Classification: Grade 2 Wound Margin: Flat and Intact Exudate Amount: Large Exudate Type: Serosanguineous Exudate Color: red, brown Foul Odor After Cleansing: No Slough/Fibrino Yes Wound Bed Granulation Amount: Small (1-33%) Exposed Structure Granulation Quality: Red, Pink Fascia Exposed: No Necrotic Amount: Large (67-100%) Fat Layer (Subcutaneous Tissue) Exposed: No Necrotic Quality: Eschar, Adherent Slough Tendon Exposed: Yes Muscle Exposed: No Joint Exposed: No Bone Exposed: No Treatment Notes Wound #1 (Foot) Wound Laterality: Dorsal, Right Cleanser Peri-Wound Care Topical Primary Dressing Promogran Prisma Matrix, 4.34 (sq in) (silver collagen) Discharge Instruction: Apply silver collagen to wound bed as instructed. moisten with hydrogel or KJ jelly. Secondary Dressing ComfortFoam Border, 3x3 in (silicone border) Discharge Instruction: Apply over primary dressing as directed. Secured With Compression Wrap Compression Stockings Environmental education officer) Signed: 12/27/2019 5:27:06 PM By: Rhae Hammock RN Entered By: Rhae Hammock on 12/27/2019 10:14:25 -------------------------------------------------------------------------------- Vitals Details Patient Name: Date of Service: [name], [name] 12/27/2019 9:45 A M Medical Record Number: [id number] Patient Account Number: [id number] Date of Birth/Sex: Treating RN: May 10, 1960 (59 y.o. Burnadette Pop, Lauren Primary Care Voris Tigert: [name] Other Clinician: Referring Arisha Gervais: Treating Maizee Reinhold/Extender: [name] in Treatment: 19 Vital Signs Time Taken: 10:17 Temperature (F): 97.7 Height (in): 62 Pulse (bpm): 85 Weight (lbs): 130 Respiratory Rate (breaths/min): 17 Body Mass Index (BMI): 23.8 Blood Pressure (mmHg): 114/77 Reference Range: 80 - 120 mg / dl Electronic Signature(s) Signed: 12/27/2019 5:27:06 PM By: Rhae Hammock RN Entered By: Rhae Hammock on 12/27/2019 10:17:37

## 2020-01-03 ENCOUNTER — Inpatient Hospital Stay (HOSPITAL_COMMUNITY): Admission: RE | Admit: 2020-01-03 | Payer: Medicare Other | Source: Ambulatory Visit

## 2020-01-03 ENCOUNTER — Encounter (HOSPITAL_COMMUNITY): Payer: Medicare Other

## 2020-01-10 ENCOUNTER — Encounter (HOSPITAL_BASED_OUTPATIENT_CLINIC_OR_DEPARTMENT_OTHER): Payer: Medicare Other | Admitting: Internal Medicine

## 2020-01-14 NOTE — Progress Notes (Signed)
Cardiology Office Note:   Date:  01/16/2020  NAME:  [name].    MRN: [id number] DOB:  1960/07/22   PCP:  Loretha Brasil, FNP (Inactive)  Cardiologist:  Evalina Field, MD   Referring MD: Loretha Brasil, FNP   Chief Complaint  Patient presents with  . PAD   History of Present Illness:   [name]. is a 59 y.o. male with a hx of PAD/critical limb ischemia, hypertension, hyperlipidemia, CKD, tobacco abuse who presents for follow-up. Needs repeat lipid profile.   He is undergone 2 interventions to the right leg.  He still has a wound on the top of his right foot.  It appears to be healing.  The wound care physician believes things are improving.  He still describes a lot of pain.  Heart rate initially was 115 bpm on arrival.  He reports significant pain.  It has improved.  Heart rates come down into the 90s.  He is on aspirin Plavix no bleeding issues.  He is still smoking 4 cigarettes/day.  I did inform him that he needs to get this value down to 0 quickly.  He may need left peripheral intervention.  I will reach out to Dr. Alvester Chou about this.  Apparently there was an issue during the initial angiogram in July.  He will have follow-up leg ultrasounds next week.  Blood pressure 115/72.  He reports he is walking a good amount.  No chest pain or shortness of breath.  He denies any palpitations.   Problem List 1. Diabetes -A1c 6.3 2. CKD 3 3. HLD -T chol 144, HDL 45, LDL 87, TG 57 4. HTN 5. Tobacco abuse 6.  PAD -Right SFA atherectomy and angioplasty 07/16/2019 -Critical limb ischemia with poor wound healing of right ankle wounds -R common iliac artery 12/19/2019 stent  Past Medical History: Past Medical History:  Diagnosis Date  . Diabetes mellitus without complication ([place])   . Hyperlipidemia   . Hypertension   . Peripheral vascular disease ([place])   . Tobacco use    Past Surgical History: Past Surgical History:  Procedure Laterality Date  . ABDOMINAL  AORTOGRAM W/LOWER EXTREMITY Left 07/16/2019   Procedure: ABDOMINAL AORTOGRAM W/LOWER EXTREMITY;  Surgeon: Lorretta Harp, MD;  Location: [facility];  Service: Cardiovascular;  Laterality: Left;  . ABDOMINAL AORTOGRAM W/LOWER EXTREMITY Left 08/16/2019  . ABDOMINAL AORTOGRAM W/LOWER EXTREMITY N/A 08/16/2019   Procedure: ABDOMINAL AORTOGRAM W/LOWER EXTREMITY;  Surgeon: Lorretta Harp, MD;  Location: [facility];  Service: Cardiovascular;  Laterality: N/A;  . ABDOMINAL AORTOGRAM W/LOWER EXTREMITY Bilateral 12/19/2019   Procedure: ABDOMINAL AORTOGRAM W/LOWER EXTREMITY;  Surgeon: Wellington Hampshire, MD;  Location: [facility];  Service: Cardiovascular;  Laterality: Bilateral;  . arm surgery    . PERIPHERAL VASCULAR BALLOON ANGIOPLASTY  08/16/2019   Procedure: PERIPHERAL VASCULAR BALLOON ANGIOPLASTY;  Surgeon: Lorretta Harp, MD;  Location: [facility];  Service: Cardiovascular;;  attempted PTA of Left SFA  . PERIPHERAL VASCULAR INTERVENTION Right 07/16/2019   Procedure: PERIPHERAL VASCULAR INTERVENTION;  Surgeon: Lorretta Harp, MD;  Location: [facility];  Service: Cardiovascular;  Laterality: Right;  . PERIPHERAL VASCULAR INTERVENTION Right 12/19/2019   Procedure: PERIPHERAL VASCULAR INTERVENTION;  Surgeon: Wellington Hampshire, MD;  Location: [facility];  Service: Cardiovascular;  Laterality: Right;  iliac    Current Medications: Current Meds  Medication Sig  . aspirin EC 81 MG EC tablet Take  1 tablet (81 mg total) by mouth daily. Swallow whole. (Patient taking differently: Take 81 mg by mouth daily at 2 PM. Swallow whole.)  . atorvastatin (LIPITOR) 80 MG tablet Take 1 tablet (80 mg total) by mouth daily. (Patient taking differently: Take 80 mg by mouth daily at 2 PM.)  . Blood Pressure Monitor KIT Check blood pressure daily  . carboxymethylcellulose (REFRESH PLUS) 0.5 % SOLN Place 1 drop into both eyes 3 (three) times daily as needed (dry/irritated  eyes).   . clopidogrel (PLAVIX) 75 MG tablet Take 1 tablet (75 mg total) by mouth daily with breakfast.  . gabapentin (NEURONTIN) 300 MG capsule Take 300 mg by mouth in the morning, at noon, and at bedtime.   Marland Kitchen lisinopril-hydrochlorothiazide (ZESTORETIC) 20-12.5 MG tablet Take 1 tablet by mouth daily.  Marland Kitchen SANTYL ointment Apply 1 application topically daily as needed (wound care).   . SSD 1 % cream Apply 1 application topically daily as needed (wound care).   . traMADol (ULTRAM) 50 MG tablet Take 50 mg by mouth 3 (three) times daily as needed (pain).    Current Facility-Administered Medications for the 01/16/20 encounter (Office Visit) with Geralynn Rile, MD  Medication  . sodium chloride flush (NS) 0.9 % injection 3 mL     Allergies:    Patient has no known allergies.   Social History: Social History   Socioeconomic History  . Marital status: Legally Separated    Spouse name: Not on file  . Number of children: Not on file  . Years of education: Not on file  . Highest education level: Not on file  Occupational History  . Not on file  Tobacco Use  . Smoking status: Current Every Day Smoker    Packs/day: 0.50    Years: 40.00    Pack years: 20.00    Types: Cigarettes  . Smokeless tobacco: Never Used  Vaping Use  . Vaping Use: Never used  Substance and Sexual Activity  . Alcohol use: Yes    Comment: beer and liquor daily  . Drug use: Yes    Frequency: 3.0 times per week    Types: Marijuana  . Sexual activity: Not on file  Other Topics Concern  . Not on file  Social History Narrative  . Not on file   Social Determinants of Health   Financial Resource Strain: Not on file  Food Insecurity: Not on file  Transportation Needs: Not on file  Physical Activity: Not on file  Stress: Not on file  Social Connections: Not on file     Family History: The patient's family history includes Cancer in his mother; Diabetes in his father.  ROS:   All other ROS reviewed and  negative. Pertinent positives noted in the HPI.     EKGs/Labs/Other Studies Reviewed:   The following studies were personally reviewed by me today:  TTE 07/12/2019 1. Left ventricular ejection fraction, by estimation, is 55 to 60%. Left  ventricular ejection fraction by 3D volume is 59 %. The left ventricle has  normal function. The left ventricle has no regional wall motion  abnormalities. Left ventricular diastolic  parameters are indeterminate. The average left ventricular global  longitudinal strain is -20.4 %. The global longitudinal strain is normal.  2. Right ventricular systolic function is normal. The right ventricular  size is normal. Tricuspid regurgitation signal is inadequate for assessing  PA pressure.  3. The mitral valve is grossly normal. Mild mitral valve regurgitation.  No evidence of mitral stenosis.  4. The aortic valve is tricuspid. Aortic valve regurgitation is not  visualized. No aortic stenosis is present.  5. The inferior vena cava is normal in size with greater than 50%  respiratory variability, suggesting right atrial pressure of 3 mmHg.   Recent Labs: 12/12/2019: ALT 21; Hemoglobin 11.7; Platelets 249 12/20/2019: BUN 14; Creatinine, Ser 0.88; Potassium 4.1; Sodium 137   Recent Lipid Panel    Component Value Date/Time   CHOL 144 12/12/2019 1006   TRIG 57 12/12/2019 1006   HDL 45 12/12/2019 1006   CHOLHDL 3.2 12/12/2019 1006   LDLCALC 87 12/12/2019 1006    Physical Exam:   VS:  BP 115/72   Pulse (!) 115   Temp (!) 97 F (36.1 C)   Ht '5\' 2"'  (1.575 m)   Wt 102 lb 6.4 oz (46.4 kg)   SpO2 95%   BMI 18.73 kg/m    Wt Readings from Last 3 Encounters:  01/16/20 102 lb 6.4 oz (46.4 kg)  12/20/19 101 lb 3.1 oz (45.9 kg)  12/12/19 108 lb (49 kg)    General: Well nourished, well developed, in no acute distress Head: Atraumatic, normal size  Eyes: PEERLA, EOMI  Neck: Supple, no JVD Endocrine: No thryomegaly Cardiac: Normal S1, S2; RRR; no  murmurs, rubs, or gallops Lungs: Clear to auscultation bilaterally, no wheezing, rhonchi or rales  Abd: Soft, nontender, no hepatomegaly  Ext: Absent pulses in the left lower extremity, faint pulses in the right lower extremity, open wound on the right dorsal aspect of the foot. Musculoskeletal: No deformities, BUE and BLE strength normal and equal Skin: Warm and dry, no rashes   Neuro: Alert and oriented to person, place, time, and situation, CNII-XII grossly intact, no focal deficits  Psych: Normal mood and affect   ASSESSMENT:   Devaun Hernandez. is a 59 y.o. male who presents for the following: 1. PVD (peripheral vascular disease) ([place])   2. Critical ischemia of foot ([place])   3. Pure hypercholesterolemia   4. Essential hypertension   5. Tobacco use disorder     PLAN:   1. PVD (peripheral vascular disease) ([place]) 2. Critical ischemia of foot ([place]) 3. Pure hypercholesterolemia -Right SFA atherectomy and angioplasty 07/16/2019 -Critical limb ischemia with poor wound healing of right ankle wounds -R common iliac artery 12/19/2019 stent -Status post 2 interventions to the right leg.  His wound appears to be healing with good granulation tissue.  Hopefully this will continue to heal.  He may need left leg intervention as well.  Follow-up Dr. Alvester Chou for this. -Continue aspirin and Plavix for now.  Likely will pursue indefinite DAPT given severity of PAD. -Most recent LDL cholesterol 87.  He is fasting today.  We will check this today.  Goal LDL cholesterol less than 70. -He is still smoking 4 cigarettes/day.  Needs to quit.  We discussed this in the office today.  He will work on this.  4. Essential hypertension -Well-controlled blood pressure on lisinopril and HCTZ.  Denies any chest pain or shortness of breath.  Continue this.  5. Tobacco use disorder -Still smoking 4 cigarettes/day.  Advised to quit.  Disposition: Return in about 6 months (around 07/16/2020).  Medication  Adjustments/Labs and Tests Ordered: Current medicines are reviewed at length with the patient today.  Concerns regarding medicines are outlined above.  Orders Placed This Encounter  Procedures  . Lipid panel   No orders of the defined types were placed in this encounter.   Patient Instructions  Medication Instructions:  The current medical regimen is effective;  continue present plan and medications.  *If you need a refill on your cardiac medications before your next appointment, please call your pharmacy*   Lab Work: LIPID today  If you have labs (blood work) drawn today and your tests are completely normal, you will receive your results only by: Marland Kitchen MyChart Message (if you have MyChart) OR . A paper copy in the mail If you have any lab test that is abnormal or we need to change your treatment, we will call you to review the results.  Follow-Up: At [facility], you and your health needs are our priority.  As part of our continuing mission to provide you with exceptional heart care, we have created designated Provider Care Teams.  These Care Teams include your primary Cardiologist (physician) and Advanced Practice Providers (APPs -  Physician Assistants and Nurse Practitioners) who all work together to provide you with the care you need, when you need it.  We recommend signing up for the patient portal called "MyChart".  Sign up information is provided on this After Visit Summary.  MyChart is used to connect with patients for Virtual Visits (Telemedicine).  Patients are able to view lab/test results, encounter notes, upcoming appointments, etc.  Non-urgent messages can be sent to your provider as well.   To learn more about what you can do with MyChart, go to [email or web address].    Your next appointment:   6 month(s)  The format for your next appointment:   In Person  Provider:   Eleonore Chiquito, MD        Time Spent with Patient: I have spent a total of 25 minutes  with patient reviewing hospital notes, telemetry, EKGs, labs and examining the patient as well as establishing an assessment and plan that was discussed with the patient.  > 50% of time was spent in direct patient care.  Signed, Addison Naegeli. Audie Box, [place]  [address], [place], [place] [address] [phone]  01/16/2020 8:34 AM

## 2020-01-16 ENCOUNTER — Encounter: Payer: Self-pay | Admitting: Cardiovascular Disease

## 2020-01-16 ENCOUNTER — Ambulatory Visit (INDEPENDENT_AMBULATORY_CARE_PROVIDER_SITE_OTHER): Payer: Medicare Other | Admitting: Cardiovascular Disease

## 2020-01-16 ENCOUNTER — Other Ambulatory Visit: Payer: Self-pay

## 2020-01-16 VITALS — BP 115/72 | HR 115 | Temp 97.0°F | Ht 62.0 in | Wt 102.4 lb

## 2020-01-16 DIAGNOSIS — I70229 Atherosclerosis of native arteries of extremities with rest pain, unspecified extremity: Secondary | ICD-10-CM

## 2020-01-16 DIAGNOSIS — I10 Essential (primary) hypertension: Secondary | ICD-10-CM

## 2020-01-16 DIAGNOSIS — E7800 Pure hypercholesterolemia, unspecified: Secondary | ICD-10-CM

## 2020-01-16 DIAGNOSIS — F17200 Nicotine dependence, unspecified, uncomplicated: Secondary | ICD-10-CM

## 2020-01-16 DIAGNOSIS — I739 Peripheral vascular disease, unspecified: Secondary | ICD-10-CM | POA: Diagnosis not present

## 2020-01-16 LAB — LIPID PANEL
Chol/HDL Ratio: 4.2 ratio (ref 0.0–5.0)
Cholesterol, Total: 157 mg/dL (ref 100–199)
HDL: 37 mg/dL — ABNORMAL LOW (ref >39)
LDL Chol Calc (NIH): 100 mg/dL — ABNORMAL HIGH (ref 0–99)
Triglycerides: 108 mg/dL (ref 0–149)
VLDL Cholesterol Cal: 20 mg/dL (ref 5–40)

## 2020-01-16 NOTE — Patient Instructions (Signed)
Medication Instructions:  The current medical regimen is effective;  continue present plan and medications.  *If you need a refill on your cardiac medications before your next appointment, please call your pharmacy*   Lab Work: LIPID today  If you have labs (blood work) drawn today and your tests are completely normal, you will receive your results only by: Marland Kitchen MyChart Message (if you have MyChart) OR . A paper copy in the mail If you have any lab test that is abnormal or we need to change your treatment, we will call you to review the results.  Follow-Up: At [facility], you and your health needs are our priority.  As part of our continuing mission to provide you with exceptional heart care, we have created designated Provider Care Teams.  These Care Teams include your primary Cardiologist (physician) and Advanced Practice Providers (APPs -  Physician Assistants and Nurse Practitioners) who all work together to provide you with the care you need, when you need it.  We recommend signing up for the patient portal called "MyChart".  Sign up information is provided on this After Visit Summary.  MyChart is used to connect with patients for Virtual Visits (Telemedicine).  Patients are able to view lab/test results, encounter notes, upcoming appointments, etc.  Non-urgent messages can be sent to your provider as well.   To learn more about what you can do with MyChart, go to [email or web address].    Your next appointment:   6 month(s)  The format for your next appointment:   In Person  Provider:   Eleonore Chiquito, MD

## 2020-01-17 ENCOUNTER — Encounter (HOSPITAL_BASED_OUTPATIENT_CLINIC_OR_DEPARTMENT_OTHER): Payer: Medicare Other | Admitting: Internal Medicine

## 2020-01-22 ENCOUNTER — Other Ambulatory Visit: Payer: Self-pay

## 2020-01-22 MED ORDER — EZETIMIBE 10 MG PO TABS: 10.0000 mg | ORAL_TABLET | Freq: Every day | ORAL | 3 refills | Status: DC

## 2020-01-24 ENCOUNTER — Ambulatory Visit (HOSPITAL_COMMUNITY): Payer: Medicare Other

## 2020-01-31 ENCOUNTER — Ambulatory Visit (HOSPITAL_COMMUNITY): Payer: Medicare Other

## 2020-02-20 ENCOUNTER — Ambulatory Visit (HOSPITAL_COMMUNITY)
Admission: RE | Admit: 2020-02-20 | Discharge: 2020-02-20 | Disposition: A | Discharge status: Home / Self Care | Payer: Medicaid Other | Source: Ambulatory Visit | Attending: Cardiovascular Disease | Admitting: Cardiovascular Disease

## 2020-02-20 ENCOUNTER — Ambulatory Visit (HOSPITAL_BASED_OUTPATIENT_CLINIC_OR_DEPARTMENT_OTHER)
Admission: RE | Admit: 2020-02-20 | Discharge: 2020-02-20 | Disposition: A | Discharge status: Home / Self Care | Payer: Medicaid Other | Source: Ambulatory Visit | Attending: Cardiovascular Disease | Admitting: Cardiovascular Disease

## 2020-02-20 ENCOUNTER — Other Ambulatory Visit: Payer: Self-pay

## 2020-02-20 ENCOUNTER — Other Ambulatory Visit: Payer: Self-pay | Admitting: Cardiovascular Disease

## 2020-02-20 DIAGNOSIS — Z9862 Peripheral vascular angioplasty status: Secondary | ICD-10-CM | POA: Insufficient documentation

## 2020-02-20 DIAGNOSIS — I70223 Atherosclerosis of native arteries of extremities with rest pain, bilateral legs: Secondary | ICD-10-CM

## 2020-02-20 DIAGNOSIS — I70229 Atherosclerosis of native arteries of extremities with rest pain, unspecified extremity: Secondary | ICD-10-CM

## 2020-02-20 DIAGNOSIS — I70221 Atherosclerosis of native arteries of extremities with rest pain, right leg: Secondary | ICD-10-CM

## 2020-02-22 ENCOUNTER — Other Ambulatory Visit: Payer: Self-pay

## 2020-02-22 ENCOUNTER — Encounter (HOSPITAL_BASED_OUTPATIENT_CLINIC_OR_DEPARTMENT_OTHER): Payer: Medicaid Other | Attending: Internal Medicine | Admitting: Internal Medicine

## 2020-02-22 DIAGNOSIS — E1140 Type 2 diabetes mellitus with diabetic neuropathy, unspecified: Secondary | ICD-10-CM | POA: Insufficient documentation

## 2020-02-22 DIAGNOSIS — I10 Essential (primary) hypertension: Secondary | ICD-10-CM | POA: Diagnosis not present

## 2020-02-22 DIAGNOSIS — E11621 Type 2 diabetes mellitus with foot ulcer: Secondary | ICD-10-CM | POA: Diagnosis present

## 2020-02-22 DIAGNOSIS — E1151 Type 2 diabetes mellitus with diabetic peripheral angiopathy without gangrene: Secondary | ICD-10-CM | POA: Diagnosis not present

## 2020-02-22 DIAGNOSIS — L97512 Non-pressure chronic ulcer of other part of right foot with fat layer exposed: Secondary | ICD-10-CM | POA: Diagnosis not present

## 2020-02-22 DIAGNOSIS — F17200 Nicotine dependence, unspecified, uncomplicated: Secondary | ICD-10-CM | POA: Insufficient documentation

## 2020-02-22 NOTE — Progress Notes (Signed)
ADRIANO, BISCHOF ([id number]) Visit Report for 02/22/2020 Arrival Information Details Patient Name: Date of Service: [name] 02/22/2020 1:15 PM Medical Record Number: [id number] Patient Account Number: [id number] Date of Birth/Sex: Treating RN: 09/17/1960 (60 y.o. Janyth Contes Primary Care Riccardo Holeman: [name] Other Clinician: Referring Sherah Lund: Treating Anahis Furgeson/Extender: [name] in Treatment: 27 Visit Information History Since Last Visit Added or deleted any medications: No Patient Arrived: Cane Any new allergies or adverse reactions: No Arrival Time: 12:51 Had a fall or experienced change in No Accompanied By: alone activities of daily living that may affect Transfer Assistance: None risk of falls: Patient Identification Verified: Yes Signs or symptoms of abuse/neglect since last visito No Secondary Verification Process Completed: Yes Hospitalized since last visit: No Patient Requires Transmission-Based Precautions: No Implantable device outside of the clinic excluding No Patient Has Alerts: Yes cellular tissue based products placed in the center Patient Alerts: Patient on Blood Thinner since last visit: R ABI: 0.85 R TBI: 0.50 Has Dressing in Place as Prescribed: No Pain Present Now: Yes Electronic Signature(s) Signed: 02/22/2020 5:52:57 PM By: Levan Hurst RN, BSN Entered By: Levan Hurst on 02/22/2020 12:51:46 -------------------------------------------------------------------------------- Encounter Discharge Information Details Patient Name: Date of Service: [name], [name] 02/22/2020 1:15 PM Medical Record Number: [id number] Patient Account Number: [id number] Date of Birth/Sex: Treating RN: 1960/05/31 (60 y.o. [name] Primary Care Bruce Churilla: [name] Other Clinician: Referring Kanyia Heaslip: Treating Markanthony Gedney/Extender: [name] in Treatment: 27 Encounter Discharge  Information Items Post Procedure Vitals Discharge Condition: Stable Temperature (F): 97.9 Ambulatory Status: Ambulatory Pulse (bpm): 97 Discharge Destination: Home Respiratory Rate (breaths/min): 16 Transportation: Private Auto Blood Pressure (mmHg): 127/71 Accompanied By: self Schedule Follow-up Appointment: Yes Clinical Summary of Care: Electronic Signature(s) Signed: 02/22/2020 5:42:38 PM By: Deon Pilling Entered By: Deon Pilling on 02/22/2020 14:18:44 -------------------------------------------------------------------------------- Lower Extremity Assessment Details Patient Name: Date of Service: [name] 02/22/2020 1:15 PM Medical Record Number: [id number] Patient Account Number: [id number] Date of Birth/Sex: Treating RN: [birth year] (60 y.o. Janyth Contes Primary Care Amani Nodarse: [name] Other Clinician: Referring Malikai Gut: Treating Rayvion Stumph/Extender: Domenic Polite Weeks in Treatment: 27 Edema Assessment Assessed: Shirlyn Goltz: No] Patrice Paradise: No] Edema: [Left: N] [Right: o] Calf Left: Right: Point of Measurement: 28 cm From Medial Instep 29 cm Ankle Left: Right: Point of Measurement: 9 cm From Medial Instep 18 cm Vascular Assessment Pulses: Dorsalis Pedis Palpable: [Right:Yes] Electronic Signature(s) Signed: 02/22/2020 5:52:57 PM By: Levan Hurst RN, BSN Entered By: Levan Hurst on 02/22/2020 12:55:56 -------------------------------------------------------------------------------- Multi Wound Chart Details Patient Name: Date of Service: [name], [name] 02/22/2020 1:15 PM Medical Record Number: [id number] Patient Account Number: [id number] Date of Birth/Sex: Treating RN: November 23, 1960 (60 y.o. 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] [N/A:N/A] Secondary Etiology: [1:Hypertension, Peripheral Arterial] [N/A:N/A] Comorbid History: [1:Disease, Type II Diabetes, Neuropathy 03/26/2019] [N/A:N/A] Date Acquired: [1:27] [N/A:N/A] Weeks of Treatment: [1:Open] [N/A:N/A] Wound Status: [1:1.5x1.4x0.3] [N/A:N/A] Measurements L x W x D (cm) [1:1.649] [N/A:N/A] A (cm) : rea [1:0.495] [N/A:N/A] Volume (cm) : [1:41.70%] [N/A:N/A] %  Reduction in A rea: [1:-74.90%] [N/A:N/A] % Reduction in Volume: [1:4] Starting Position 1 (o'clock): [1:7] Ending Position 1 (o'clock): [1:0.3] Maximum Distance 1 (cm): [1:Yes] [N/A:N/A] Undermining: [1:Grade 2] [N/A:N/A] Classification: [1:Medium] [N/A:N/A] Exudate A mount: [1:Serous] [N/A:N/A] Exudate Type: [1:amber] [N/A:N/A] Exudate Color: [1:Flat and Intact] [N/A:N/A] Wound Margin: [1:Small (1-33%)] [N/A:N/A] Granulation A mount: [1:Pink, Pale] [N/A:N/A] Granulation Quality: [1:Large (67-100%)] [N/A:N/A] Necrotic A mount: [1:Tendon: Yes] [N/A:N/A] Exposed Structures: [1:Fascia: No Fat Layer (Subcutaneous Tissue): No Muscle: No Joint: No Bone: No Small (1-33%)] [N/A:N/A] Epithelialization: [1:Chemical/Enzymatic/Mechanical] [N/A:N/A] Debridement: [1:N/A] [N/A:N/A] Instrument: [1:None] [N/A:N/A] Bleeding: Debridement Treatment Response: Procedure was tolerated well [N/A:N/A] Post Debridement Measurements L x 1.5x1.4x0.3 [N/A:N/A] W x D (cm) [1:0.495] [N/A:N/A] Post Debridement Volume: (cm) [1:Debridement] [N/A:N/A] Treatment Notes Electronic Signature(s) Signed: 02/22/2020 5:33:44 PM By: Linton Ham MD Signed: 02/22/2020 5:52:16  PM By: Baruch Gouty RN, BSN Entered By: Linton Ham on 02/22/2020 13:13:05 -------------------------------------------------------------------------------- [place] Details Patient Name: Date of Service: [place] RD, [name] 02/22/2020 1:15 PM Medical Record Number: [id number] Patient Account Number: [id number] Date of Birth/Sex: Treating RN: [birth year] (60 y.o. [name] Primary Care Cassey Bacigalupo: [name] Other Clinician: Referring Amyre Segundo: Treating Uday Jantz/Extender: [name] in Treatment: 27 Active Inactive Nutrition Nursing Diagnoses: Impaired glucose control: actual or potential Potential for alteratiion in Nutrition/Potential for imbalanced nutrition Goals: Patient/caregiver will maintain therapeutic glucose control Date Initiated: 08/15/2019 Target Resolution Date: 03/21/2020 Goal Status: Active Interventions: Assess HgA1c results as ordered upon admission and as needed Assess patient nutrition upon admission and as needed per policy Treatment Activities: Patient referred to Primary Care Physician for further nutritional evaluation : 08/15/2019 Notes: Wound/Skin Impairment Nursing Diagnoses: Impaired tissue integrity Knowledge deficit related to smoking impact on wound healing Knowledge deficit related to ulceration/compromised skin integrity Goals: Patient will demonstrate a reduced rate of smoking or cessation of smoking Date Initiated: 08/15/2019 Target Resolution Date: 03/21/2020 Goal Status: Active Patient/caregiver will verbalize understanding of skin care regimen Date Initiated: 08/15/2019 Date Inactivated: 12/27/2019 Target Resolution Date: 12/28/2019 Goal Status: Met Ulcer/skin breakdown will have a volume reduction of 30% by week 4 Date Initiated: 08/15/2019 Date Inactivated: 09/21/2019 Target Resolution Date: 09/12/2019 Goal Status: Met Interventions: Assess patient/caregiver ability to  obtain necessary supplies Assess patient/caregiver ability to perform ulcer/skin care regimen upon admission and as needed Assess ulceration(s) every visit Provide education on ulcer and skin care Treatment Activities: Skin care regimen initiated : 08/15/2019 Topical wound management initiated : 08/15/2019 Notes: Electronic Signature(s) Signed: 02/22/2020 5:52:16 PM By: Baruch Gouty RN, BSN Entered By: Baruch Gouty on 02/22/2020 13:06:54 -------------------------------------------------------------------------------- Pain Assessment Details Patient Name: Date of Service: [name] 02/22/2020 1:15 PM Medical Record Number: [id number] Patient Account Number: [id number] Date of Birth/Sex: Treating RN: 08/28/1960 (60 y.o. Janyth Contes Primary Care Delvis Kau: [name] Other Clinician: Referring Darrek Leasure: Treating Artha Stavros/Extender: [name] in Treatment: 27 Active Problems Location of Pain Severity and Description of Pain Patient Has Paino Yes Site Locations Pain Location: Pain Location: Pain in Ulcers Rate the pain. Current Pain Level: 6 Character of Pain Describe the Pain: Throbbing Pain Management and Medication Current Pain Management: Medication: Yes Cold Application: No Rest: No Massage: No Activity: No T.E.N.S.: No Heat Application: No Leg drop or elevation: No Is the Current Pain Management Adequate: Inadequate How does your wound impact your activities of daily livingo Sleep: No Bathing: No Appetite: No Relationship With Others: No Bladder Continence: No Emotions: No Bowel Continence: No  Work: No Toileting: No Drive: No Dressing: No Hobbies: No Engineer, maintenance) Signed: 02/22/2020 5:52:57 PM By: Levan Hurst RN, BSN Entered By: Levan Hurst on 02/22/2020 12:52:47 -------------------------------------------------------------------------------- Patient/Caregiver Education Details Patient  Name: Date of Service: [name] 1/28/2022andnbsp1:15 PM Medical Record Number: [id number] Patient Account Number: [id number] Date of Birth/Gender: Treating RN: 04-09-1960 (60 y.o. [name] Primary Care Physician: [name] Other Clinician: Referring Physician: Treating Physician/Extender: [name] in Treatment: 27 Education Assessment Education Provided To: Patient Education Topics Provided Tissue Oxygenation: Methods: Explain/Verbal Responses: Reinforcements needed, State content correctly Wound/Skin Impairment: Methods: Explain/Verbal Responses: Reinforcements needed, State content correctly Electronic Signature(s) Signed: 02/22/2020 5:52:16 PM By: Baruch Gouty RN, BSN Entered By: Baruch Gouty on 02/22/2020 13:07:14 -------------------------------------------------------------------------------- Wound Assessment Details Patient Name: Date of Service: [name] 02/22/2020 1:15 PM Medical Record Number: [id number] Patient Account Number: [id number] Date of Birth/Sex: Treating RN: 02/05/1960 (60 y.o. Janyth Contes Primary Care Traci Plemons: [name] Other Clinician: Referring Saydi Kobel: Treating Kaivon Livesey/Extender: Domenic Polite Weeks in Treatment: 27 Wound Status Wound Number: 1 Primary Diabetic Wound/Ulcer of the Lower Extremity Etiology: Wound Location: Right, Dorsal Foot Secondary Arterial Insufficiency Ulcer Wounding Event: Gradually Appeared Etiology: Date Acquired: 03/26/2019 Wound Status: Open Weeks Of Treatment: 27 Comorbid Hypertension, Peripheral Arterial Disease, Type II Diabetes, Clustered Wound: No History: Neuropathy Wound Measurements Length: (cm) 1.5 Width: (cm) 1.4 Depth: (cm) 0.3 Area: (cm) 1.649 Volume: (cm) 0.495 % Reduction in Area: 41.7% % Reduction in Volume: -74.9% Epithelialization: Small (1-33%) Tunneling: No Undermining: Yes Starting  Position (o'clock): 4 Ending Position (o'clock): 7 Maximum Distance: (cm) 0.3 Wound Description Classification: Grade 2 Wound Margin: Flat and Intact Exudate Amount: Medium Exudate Type: Serous Exudate Color: amber Foul Odor After Cleansing: No Slough/Fibrino Yes Wound Bed Granulation Amount: Small (1-33%) Exposed Structure Granulation Quality: Pink, Pale Fascia Exposed: No Necrotic Amount: Large (67-100%) Fat Layer (Subcutaneous Tissue) Exposed: No Necrotic Quality: Adherent Slough Tendon Exposed: Yes Muscle Exposed: No Joint Exposed: No Bone Exposed: No Treatment Notes Wound #1 (Foot) Wound Laterality: Dorsal, Right Cleanser Peri-Wound Care Topical Primary Dressing Santyl Ointment Discharge Instruction: Apply nickel thick amount to wound bed as instructed Secondary Dressing ComfortFoam Border, 3x3 in (silicone border) Discharge Instruction: Apply over primary dressing as directed. Secured With Compression Wrap Compression Stockings Environmental education officer) Signed: 02/22/2020 5:52:16 PM By: Baruch Gouty RN, BSN Signed: 02/22/2020 5:52:57 PM By: Levan Hurst RN, BSN Entered By: Baruch Gouty on 02/22/2020 13:09:36 -------------------------------------------------------------------------------- Vitals Details Patient Name: Date of Service: [name], [name] 02/22/2020 1:15 PM Medical Record Number: [id number] Patient Account Number: [id number] Date of Birth/Sex: Treating RN: 1960-10-17 (60 y.o. Janyth Contes Primary Care Elber Galyean: [name] Other Clinician: Referring Shamari Lofquist: Treating Britley Gashi/Extender: Domenic Polite Weeks in Treatment: 27 Vital Signs Time Taken: 12:52 Temperature (F): 97.9 Height (in): 62 Pulse (bpm): 97 Weight (lbs): 130 Respiratory Rate (breaths/min): 16 Body Mass Index (BMI): 23.8 Blood Pressure (mmHg): 127/71 Reference Range: 80 - 120 mg / dl Electronic Signature(s) Signed: 02/22/2020  5:52:57 PM By: Levan Hurst RN, BSN Entered By: Levan Hurst on 02/22/2020 12:52:35

## 2020-02-22 NOTE — Progress Notes (Signed)
ARYEN, KISE ([id number]) Visit Report for 02/22/2020 Debridement Details Patient Name: Date of Service: [name] 02/22/2020 1:15 PM Medical Record Number: [id number] Patient Account Number: [id number] Date of Birth/Sex: Treating RN: 03-Oct-1960 (60 y.o. [name] Primary Care Provider: [name] Other Clinician: Referring Provider: Treating Provider/Extender: [name] in Treatment: 27 Debridement Performed for Assessment: Wound #1 Right,Dorsal Foot Performed By: Clinician Deon Pilling, RN Debridement Type: Chemical/Enzymatic/Mechanical Agent Used: Santyl Severity of Tissue Pre Debridement: Muscle involvement without necrosis Level of Consciousness (Pre-procedure): Awake and Alert Pre-procedure Verification/Time Out No Taken: Bleeding: None Response to Treatment: Procedure was tolerated well Level of Consciousness (Post- Awake and Alert procedure): Post Debridement Measurements of Total Wound Length: (cm) 1.5 Width: (cm) 1.4 Depth: (cm) 0.3 Volume: (cm) 0.495 Character of Wound/Ulcer Post Debridement: Requires Further Debridement Severity of Tissue Post Debridement: Muscle involvement without necrosis Post Procedure Diagnosis Same as Pre-procedure Electronic Signature(s) Signed: 02/22/2020 5:33:44 PM By: Linton Ham MD Signed: 02/22/2020 5:52:16 PM By: Baruch Gouty RN, BSN Entered By: Baruch Gouty on 02/22/2020 13:09:11 -------------------------------------------------------------------------------- HPI Details Patient Name: Date of Service: [name], [name]. 02/22/2020 1:15 PM Medical Record Number: [id number] Patient Account Number: [id number] Date of Birth/Sex: Treating RN: January 07, 1961 (60 y.o. [name] Primary Care Provider: [name] Other Clinician: Referring Provider: Treating Provider/Extender: [name] in Treatment: 27 History of Present  Illness HPI Description: 08/15/2019 upon evaluation today patient presents for evaluation here in our clinic concerning issues that he is having with wounds on the dorsal surface of his right foot. This is something that was noted to occur after he was obtaining cortisone injections with [facility]. Subsequently he developed ulcerations he was then referred to Dr. Alvester Chou who performed arterial testing and subsequently found that the patient had poor arterial flow with an ABI of 0.6 and a TBI of 0.26. With that being said he has since on 07/16/2019 had an angiogram on the right with improvement of his ABI to 0.85 and TBI to 0.5. Obviously this is not perfect but is definitely far superior to where it was previous. He does have some necrotic tissue on the surface of the wounds is going to need debriding away he has been using Silvadene on this but to be honest that is not can to do anything for these wounds. He needs something more to clear this away sharp debridement is ideal and we may even look towards Santyl as well. The patient does have a history of diabetes noted in his chart though is not on medication and his A1c was around 6.4 so is not terrible. With that being said he does have known peripheral vascular disease he is actually have an angiogram on the left tomorrow. He also has hypertension and he does smoke. 8/27; this is a patient has been here once before about 6 weeks ago. Since then he was hospitalized from 7/22 through 7/23 by Dr. Gwenlyn Found for an attempt at revascularization. He had previously been seen by Dr. Amalia Hailey of podiatry. He is a continued tobacco smoker. His angiogram on 6/21 via revealed a occluded SFAs bilaterally with one-vessel runoff. He had a atherectomy followed by a drug-coated balloon angioplasty of the right SFA. 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 He had some improvement in his right ABI from 0.6-0.85. There is also an attempt to open  his left SFA but that  was unsuccessful. He does not have a wound on the left side. We are using Santyl on the patient's wounds on the right dorsal foot. He says he has about a 1 minute exercise tolerance before stopping because of pain compatible with claudication 9/10; we are using Santyl on the wound on the right dorsal foot. Still has significant claudication symptoms. He is status post angioplasty of the right SFA and atherectomy. He has one-vessel runoff via a diseased peroneal. He follows up with Dr. Gwenlyn Found on 9/14 9/17; I had my notes from last week that the patient was to see Dr. Gwenlyn Found on 9/14 he tells me that the appointment is actually on 9/21 although I do not see that in epic. The next appointment I see with Dr. Gwenlyn Found is on 11/17. Nevertheless the patient is fairly adamant that his appointment is early next week I told him to call ahead before he goes. The wound is a lot better we have been using Santyl over that he just ran out of that all represcribe it. 10/1; patient arrives today with nothing on his wound. He says he has been using the Santyl but he feels the border foam is too painful. We told him he needs to put a covering on this of some sort perhaps gauze. Indeed after our discussion last time his appointment with Dr. Gwenlyn Found 11/17. Previously he has had a atherectomy and an angioplasty of the right SFA. I note in my previous notes it was said he had bilateral one-vessel runoff I'll need to review the tibial vessels on the angiogram. The patient is currently rating his pain at 8 out of 10 He followed up with Dr. Amalia Hailey at [facility] on 9/27. He emphasized to follow-up with vascular and [place]. Follow-up with them as needed. 10/15; patient ran out of Santyl and has been using Silvadene. He still has constant pain which I think is largely claudication. I have looked over his angiogram results. I need to communicate with Dr. Gwenlyn Found to see if anything else can be done here. He arrives  in clinic today with tendon over most of the wound surface area 10/29; we are using silver collagen on the right foot. Still complaining of a lot of pain especially at night. Dr. Gwenlyn Found is out of town this week I will send him an email but he has a follow-up appointment on 11/17. 11/18; patient saw Dr. Gwenlyn Found yesterday. He has a history of a directional atherectomy followed by a drug-coated balloon angioplasty on the right SFA. 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 His right ABI increased from 0.6 2.85 however the right foot wound has not budged. He is going next Wednesday for an attempt to do tibial artery interventions. He mentions that he has one-vessel runoff via the peroneal.. They are going to attempt to look at the anterior tibial and posterior tibial revascularization next Wednesday. If they were successful at this then I will consider debridement and changing the topical dressings to this wound. Patient is still in a lot of pain 12/2; the patient underwent revascularization by Dr. Fletcher Anon on 12/19/2019. 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 The posterior tibial artery gives good flow into the whole pedal arch getting collaterals from the peroneal artery  the anterior tibial artery was occluded. Unfortunately his right anterior tibial artery is not amenable for revascularization. He had a drug-eluting stent placed in the right common iliac artery into the external iliac artery. The patient arrives saying his pain is about the same. He still has exposed tendon in the base of the wound however there is some healthy looking granulation at the circumference. Perhaps reason for some guarded optimism. Comes in today not wanting debridement asking for pain  medication 02/22/2020; patient has not been here in almost 2 months. He has a punched-out area on the right dorsal foot. Completely necrotic surface. We have been using silver collagen when he was last here the beginning of December he ran out of this a long time ago. I think he has been using Silvadene cream. He follows up with Dr. Fletcher Anon or Dr. Gwenlyn Found next week. He arrives in clinic as usual not wanting debridement unfortunately its debridement is what he needs Electronic Signature(s) Signed: 02/22/2020 5:33:44 PM By: Linton Ham MD Entered By: Linton Ham on 02/22/2020 13:14:41 -------------------------------------------------------------------------------- Physical Exam Details Patient Name: Date of Service: [name]. 02/22/2020 1:15 PM Medical Record Number: [id number] Patient Account Number: [id number] Date of Birth/Sex: Treating RN: August 12, 1960 (60 y.o. [name] Primary Care Provider: [name] Other Clinician: Referring Provider: Treating Provider/Extender: Domenic Polite Weeks in Treatment: 27 Constitutional Sitting or standing Blood Pressure is within target range for patient.. Pulse regular and within target range for patient.Marland Kitchen Respirations regular, non-labored and within target range.. Temperature is normal and within the target range for the patient.Marland Kitchen Appears in no distress. Notes Wound exam; dorsal right foot. Punched out area. Completely nonviable surface probably some exposed tendon underneath this. There is no evidence of surrounding infection however. I cannot feel his right dorsalis pedis and posterior tibial arteries although his popliteal pulses palpable. Electronic Signature(s) Signed: 02/22/2020 5:33:44 PM By: Linton Ham MD Entered By: Linton Ham on 02/22/2020 13:16:32 -------------------------------------------------------------------------------- Physician Orders Details Patient Name: Date of Service: [name], [name] 02/22/2020 1:15 PM Medical Record Number: [id number] Patient Account Number: [id number] Date of Birth/Sex: Treating RN: [birth year] (60 y.o. [name] Primary Care Provider: [name] Other Clinician: Referring Provider: Treating Provider/Extender: [name] in Treatment: 27 Verbal / Phone Orders: No Diagnosis Coding ICD-10 Coding Code Description I73.89 Other specified peripheral vascular diseases E11.621 Type 2 diabetes mellitus with foot ulcer L97.512 Non-pressure chronic ulcer of other part of right foot with fat layer exposed I10 Essential (primary) hypertension F17.218 Nicotine dependence, cigarettes, with other nicotine-induced disorders Follow-up Appointments Return Appointment in 2 weeks. Bathing/ Shower/ Hygiene May shower and wash wound with soap and water. - with dressing changes. Edema Control - Lymphedema / SCD / Other Elevate legs to the level of the heart or above for 30 minutes daily and/or when sitting, a frequency of: - 3-4 times a throughout the day. Avoid standing for long periods of time. Off-Loading Wound #1 Right,Dorsal Foot Open toe surgical shoe to: - ensure no pressure to top of foot. Wound Treatment Wound #1 - Foot Wound Laterality: Dorsal, Right Prim Dressing: Santyl Ointment 1 x Per Day/30 Days ary Discharge Instructions: Apply nickel thick amount to wound bed as instructed Secondary Dressing: ComfortFoam Border, 3x3 in (silicone border) (DME) (Generic) 1 x Per Day/30 Days Discharge Instructions: Apply over primary dressing as directed. Patient Medications llergies: No Known Drug Allergies A Notifications Medication Indication Start End 02/22/2020 Santyl  DOSE topical 250 unit/gram ointment - ointment topical to affected area daily Electronic Signature(s) Signed: 02/22/2020 1:18:43 PM By: Linton Ham MD Entered By: Linton Ham on 02/22/2020  13:18:42 -------------------------------------------------------------------------------- Problem List Details Patient Name: Date of Service: [name], [name]. 02/22/2020 1:15 PM Medical Record Number: [id number] Patient Account Number: [id number] Date of Birth/Sex: Treating RN: 05/24/1960 (60 y.o. [name] Primary Care Provider: [name] Other Clinician: Referring Provider: Treating Provider/Extender: Domenic Polite Weeks in Treatment: 27 Active Problems ICD-10 Encounter Code Description Active Date MDM Diagnosis I73.89 Other specified peripheral vascular diseases 08/15/2019 No Yes E11.621 Type 2 diabetes mellitus with foot ulcer 08/15/2019 No Yes L97.512 Non-pressure chronic ulcer of other part of right foot with fat layer exposed 08/15/2019 No Yes I10 Essential (primary) hypertension 08/15/2019 No Yes F17.218 Nicotine dependence, cigarettes, with other nicotine-induced disorders 08/15/2019 No Yes Inactive Problems Resolved Problems Electronic Signature(s) Signed: 02/22/2020 5:33:44 PM By: Linton Ham MD Entered By: Linton Ham on 02/22/2020 13:12:56 -------------------------------------------------------------------------------- Progress Note Details Patient Name: Date of Service: [name], [name]. 02/22/2020 1:15 PM Medical Record Number: [id number] Patient Account Number: [id number] Date of Birth/Sex: Treating RN: 03-25-1960 (60 y.o. [name] Primary Care Provider: [name] Other Clinician: Referring Provider: Treating Provider/Extender: Domenic Polite Weeks in Treatment: 27 Subjective History of Present Illness (HPI) 08/15/2019 upon evaluation today patient presents for evaluation here in our clinic concerning issues that he is having with wounds on the dorsal surface of his right foot. This is something that was noted to occur after he was obtaining cortisone injections with [facility]. Subsequently he developed ulcerations he was then referred to Dr. Alvester Chou who performed arterial testing and subsequently found that the patient had poor arterial flow with an ABI of 0.6 and a TBI of 0.26. With that being said he has since on 07/16/2019 had an angiogram on the right with improvement of his ABI to 0.85 and TBI to 0.5. Obviously this is not perfect but is definitely far superior to where it was previous. He does have some necrotic tissue on the surface of the wounds is going to need debriding away he has been using Silvadene on this but to be honest that is not can to do anything for these wounds. He needs something more to clear this away sharp debridement is ideal and we may even look towards Santyl as well. The patient does have a history of diabetes noted in his chart though is not on medication and his A1c was around 6.4 so is not terrible. With that being said he does have known peripheral vascular disease he is actually have an angiogram on the left tomorrow. He also has hypertension and he does smoke. 8/27; this is a patient has been here once before about 6 weeks ago. Since then he was hospitalized from 7/22 through 7/23 by Dr. Gwenlyn Found for an attempt at revascularization. He had previously been seen by Dr. Amalia Hailey of podiatry. He is a continued tobacco smoker. His angiogram on 6/21 via revealed a occluded SFAs bilaterally with one-vessel runoff. He had a atherectomy followed by a drug-coated balloon angioplasty of the right SFA. 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 He had some improvement in his right ABI from 0.6-0.85. There is also an attempt to open his left SFA but that was unsuccessful. He does not have a wound on the left side. We are using  Santyl on the patient's wounds on the right dorsal foot. He says he has about a 1 minute exercise tolerance before stopping because of pain compatible with claudication 9/10; we are using Santyl on  the wound on the right dorsal foot. Still has significant claudication symptoms. He is status post angioplasty of the right SFA and atherectomy. He has one-vessel runoff via a diseased peroneal. He follows up with Dr. Gwenlyn Found on 9/14 9/17; I had my notes from last week that the patient was to see Dr. Gwenlyn Found on 9/14 he tells me that the appointment is actually on 9/21 although I do not see that in epic. The next appointment I see with Dr. Gwenlyn Found is on 11/17. Nevertheless the patient is fairly adamant that his appointment is early next week I told him to call ahead before he goes. The wound is a lot better we have been using Santyl over that he just ran out of that all represcribe it. 10/1; patient arrives today with nothing on his wound. He says he has been using the Santyl but he feels the border foam is too painful. We told him he needs to put a covering on this of some sort perhaps gauze. Indeed after our discussion last time his appointment with Dr. Gwenlyn Found 11/17. Previously he has had a atherectomy and an angioplasty of the right SFA. I note in my previous notes it was said he had bilateral one-vessel runoff I'll need to review the tibial vessels on the angiogram. The patient is currently rating his pain at 8 out of 10 He followed up with Dr. Am[facility] Hailey at [facility] on 9/27. He emphasized to follow-up with vascular and [place]. Follow-up with them as needed. 10/15; patient ran out of Santyl and has been using Silvadene. He still has constant pain which I think is largely claudication. I have looked over his angiogram results. I need to communicate with Dr. Gwenlyn Found to see if anything else can be done here. He arrives in clinic today with tendon over most of the wound surface area 10/29; we are using silver collagen on the right foot. Still complaining of a lot of pain especially at night. Dr. Gwenlyn Found is out of town this week I will send him an email but he has a follow-up appointment on 11/17. 11/18;  patient saw Dr. Gwenlyn Found yesterday. He has a history of a directional atherectomy followed by a drug-coated balloon angioplasty on the right SFA. 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 His right ABI increased from 0.6 2.85 however the right foot wound has not budged. He is going next Wednesday for an attempt to do tibial artery interventions. He mentions that he has one-vessel runoff via the peroneal.. They are going to attempt to look at the anterior tibial and posterior tibial revascularization next Wednesday. If they were successful at this then I will consider debridement and changing the topical dressings to this wound. Patient is still in a lot of pain 12/2; the patient underwent revascularization by Dr. Fletcher Anon on 12/19/2019. 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 The posterior tibial artery gives good flow into the whole pedal arch getting collaterals from the peroneal artery the anterior tibial artery was occluded. Unfortunately his right anterior tibial artery is not  amenable for revascularization. He had a drug-eluting stent placed in the right common iliac artery into the external iliac artery. The patient arrives saying his pain is about the same. He still has exposed tendon in the base of the wound however there is some healthy looking granulation at the circumference. Perhaps reason for some guarded optimism. Comes in today not wanting debridement asking for pain medication 02/22/2020; patient has not been here in almost 2 months. He has a punched-out area on the right dorsal foot. Completely necrotic surface. We have been using silver collagen when he was last here the beginning of December he ran out of this a long time ago. I think he has been  using Silvadene cream. He follows up with Dr. Fletcher Anon or Dr. Gwenlyn Found next week. He arrives in clinic as usual not wanting debridement unfortunately its debridement is what he needs Objective Constitutional Sitting or standing Blood Pressure is within target range for patient.. Pulse regular and within target range for patient.Marland Kitchen Respirations regular, non-labored and within target range.. Temperature is normal and within the target range for the patient.Marland Kitchen Appears in no distress. Vitals Time Taken: 12:52 PM, Height: 62 in, Weight: 130 lbs, BMI: 23.8, Temperature: 97.9 F, Pulse: 97 bpm, Respiratory Rate: 16 breaths/min, Blood Pressure: 127/71 mmHg. General Notes: Wound exam; dorsal right foot. Punched out area. Completely nonviable surface probably some exposed tendon underneath this. There is no evidence of surrounding infection however. I cannot feel his right dorsalis pedis and posterior tibial arteries although his popliteal pulses palpable. Integumentary (Hair, Skin) Wound #1 status is Open. Original cause of wound was Gradually Appeared. The wound is located on the Right,Dorsal Foot. The wound measures 1.5cm length x 1.4cm width x 0.3cm depth; 1.649cm^2 area and 0.495cm^3 volume. There is tendon exposed. There is no tunneling noted, however, there is undermining starting at 4:00 and ending at 7:00 with a maximum distance of 0.3cm. There is a medium amount of serous drainage noted. The wound margin is flat and intact. There is small (1-33%) pink, pale granulation within the wound bed. There is a large (67-100%) amount of necrotic tissue within the wound bed including Adherent Slough. Assessment Active Problems ICD-10 Other specified peripheral vascular diseases Type 2 diabetes mellitus with foot ulcer Non-pressure chronic ulcer of other part of right foot with fat layer exposed Essential (primary) hypertension Nicotine dependence, cigarettes, with other nicotine-induced  disorders Procedures Wound #1 Pre-procedure diagnosis of Wound #1 is a Diabetic Wound/Ulcer of the Lower Extremity located on the Right,Dorsal Foot .Severity of Tissue Pre Debridement is: Muscle involvement without necrosis. There was a Chemical/Enzymatic/Mechanical debridement performed by Deon Pilling, RN.Marland Kitchen Agent used was [employer]. There was no bleeding. The procedure was tolerated well. Post Debridement Measurements: 1.5cm length x 1.4cm width x 0.3cm depth; 0.495cm^3 volume. Character of Wound/Ulcer Post Debridement requires further debridement. Severity of Tissue Post Debridement is: Muscle involvement without necrosis. Post procedure Diagnosis Wound #1: Same as Pre-Procedure Plan Follow-up Appointments: Return Appointment in 2 weeks. Bathing/ Shower/ Hygiene: May shower and wash wound with soap and water. - with dressing changes. Edema Control - Lymphedema / SCD / Other: Elevate legs to the level of the heart or above for 30 minutes daily and/or when sitting, a frequency of: - 3-4 times a throughout the day. Avoid standing for long periods of time. Off-Loading: Wound #1 Right,Dorsal Foot: Open toe surgical shoe to: - ensure no pressure to top of foot. The following medication(s) was prescribed: Santyl topical 250 unit/gram ointment ointment topical  to affected area daily starting 02/22/2020 WOUND #1: - Foot Wound Laterality: Dorsal, Right Prim Dressing: Santyl Ointment 1 x Per Day/30 Days ary Discharge Instructions: Apply nickel thick amount to wound bed as instructed Secondary Dressing: ComfortFoam Border, 3x3 in (silicone border) (DME) (Generic) 1 x Per Day/30 Days Discharge Instructions: Apply over primary dressing as directed. 1. I am going to try to put Santyl through this [employer]. He says he has [place] access Medicaid and I think they cover this versus the new commercial Medicaid's he also has Medicare 2. The major option here would be [employer] 3. I did counsel  cigarette smoking cessation 4. Follows up with vascular next week this is important and I emphasized this Electronic Signature(s) Signed: 02/22/2020 1:19:05 PM By: Linton Ham MD Entered By: Linton Ham on 02/22/2020 13:19:05 -------------------------------------------------------------------------------- SuperBill Details Patient Name: Date of Service: [name], [name]. 02/22/2020 Medical Record Number: [id number] Patient Account Number: [id number] Date of Birth/Sex: Treating RN: 30-Nov-1960 (60 y.o. [name] Primary Care Provider: [name] Other Clinician: Referring Provider: Treating Provider/Extender: Domenic Polite Weeks in Treatment: 27 Diagnosis Coding ICD-10 Codes Code Description I73.89 Other specified peripheral vascular diseases E11.621 Type 2 diabetes mellitus with foot ulcer L97.512 Non-pressure chronic ulcer of other part of right foot with fat layer exposed I10 Essential (primary) hypertension F17.218 Nicotine dependence, cigarettes, with other nicotine-induced disorders Facility Procedures CPT4 Code: [id number] Description: [phone] - DEBRIDE W/O ANES NON SELECT Modifier: Quantity: 1 Physician Procedures : CPT4 Code Description Modifier [id number] - WC PHYS LEVEL 3 - EST PT ICD-10 Diagnosis Description L97.512 Non-pressure chronic ulcer of other part of right foot with fat layer exposed E11.621 Type 2 diabetes mellitus with foot ulcer F17.218 Nicotine  dependence, cigarettes, with other nicotine-induced disorders Quantity: 1 Electronic Signature(s) Signed: 02/22/2020 5:33:44 PM By: Linton Ham MD Entered By: Linton Ham on 02/22/2020 13:19:33

## 2020-02-27 ENCOUNTER — Ambulatory Visit (INDEPENDENT_AMBULATORY_CARE_PROVIDER_SITE_OTHER): Payer: Medicare Other | Admitting: Cardiovascular Disease

## 2020-02-27 ENCOUNTER — Encounter: Payer: Self-pay | Admitting: Cardiovascular Disease

## 2020-02-27 ENCOUNTER — Other Ambulatory Visit: Payer: Self-pay

## 2020-02-27 DIAGNOSIS — I70229 Atherosclerosis of native arteries of extremities with rest pain, unspecified extremity: Secondary | ICD-10-CM

## 2020-02-27 NOTE — Patient Instructions (Signed)

## 2020-02-27 NOTE — Assessment & Plan Note (Signed)
Mr. Sennett returns today for follow-up.  He follows with Dr. Dellia Nims at the wound care clinic for a slowly healing wound on the dorsal aspect of his right foot.  He has had 2 interventions on this, one in July 2021 where I revascularized an occluded right SFA.  He had one-vessel runoff.  He did have spasm of his peroneal which ultimately was shown to have resolved repeat CT angiography.  In November he had stenting of a calcified physiologically significant right external iliac artery.  His anterior tibial artery does not reach past the ankle and therefore revascularization would be difficult.  I would continue with aggressive local wound care.  He also has claudication on the left.  I attempted left SFA intervention in July but was unsuccessful in reentering the distal lumen.  Unfortunately, he only has 1 patent tibial vessel on the left common anterior tibial, and no evidence of critical limb ischemia so I am hesitant to recommend tibial pedal access for a retrograde approach.

## 2020-02-27 NOTE — Progress Notes (Signed)
02/27/2020 [name].   1960-10-16  [id number]  Primary Physician Cipriano Mile, NP Primary Cardiologist: Lorretta Harp MD FACP, [place], [place], [place]  HPI:  [name]. is a 60 y.o.  thin and chronically ill-appearing separated African-American male with no children who does not work because of being disabled. Is referred by Dr. Lesia Sago me for evaluation treatment of critical limb ischemia.I last saw him in the office11/17/2021. The patient was initially referred to our practice by Dr. Daylene Katayama, podiatry. His risk factors include 20 to 30 pack years of tobacco use having smoked for last 45 years, treated hypertension and hyperlipidemia. He is never had a heart attack or stroke. He denies chest pain or shortness of breath. He apparently had 3 cortisone shots in his right foot 4 to 5 months ago which resulted in wounds at the injection sites which have not healed. Also complains of claudication bilaterally. Recent Dopplers performed today revealed a right ABI of 0.60 and a left of 0.45 with occluded SFAs bilaterally. He does have CKD 3 with serum creatinine in the mid 1 range.  I performed peripheral angiography on him 07/16/2019 revealing occluded SFAs bilaterally with one-vessel runoff. I performed directional atherectomy followed by drug-coated balloon angioplasty of his right SFA. 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 His right ABI increased from 0.60-0.85. The wounds on his right foot are still painful and still have not yet to heal.  I attempted to open up his left SFA CTO 08/16/2019 was unsuccessful reentering the distal. He also had a partial failure of his right common femoral Perclose. He was kept overnight. He still has claudication on the left. He also continues to smoke.   He continues to smoke several cigarettes a day.  The wound on the dorsal aspect of his right foot still has not healed despite aggressive  local wound care and according to Dr. Dellia Nims, he suspects he needs additional blood flow in order to facilitate this.  He still complains of claudication in his left leg from his left SFA CTO which I was unsuccessful in crossing.    He underwent right external iliac artery stenting by Dr. Fletcher Anon 12/19/2019.  His right SFA was patent as was his peroneal.  He did not have a patent anterior tibial at the level of the ankle.  He still complains of left lower extremity claudication but does not have an open wound.  I was unable to reenter the reconstituted distal SFA via the antegrade approach unfortunately retrograde approach would be via his only patent tibial vessel to the foot, his anterior tibial artery.  He does continue to smoke.    Current Meds  Medication Sig  . aspirin EC 81 MG EC tablet Take 1 tablet (81 mg total) by mouth daily. Swallow whole. (Patient taking differently: Take 81 mg by mouth daily at 2 PM. Swallow whole.)  . atorvastatin (LIPITOR) 80 MG tablet Take 1 tablet (80 mg total) by mouth daily. (Patient taking differently: Take 80 mg by mouth daily at 2 PM.)  . Blood Pressure Monitor KIT Check blood pressure daily  . carboxymethylcellulose (REFRESH PLUS) 0.5 % SOLN Place 1 drop into both eyes 3 (three) times daily as needed (dry/irritated eyes).   . clopidogrel (PLAVIX) 75 MG tablet Take 1 tablet (75 mg total) by mouth daily with breakfast.  . ezetimibe (ZETIA) 10 MG tablet Take 1 tablet (10 mg total) by mouth daily.  Marland Kitchen  gabapentin (NEURONTIN) 300 MG capsule Take 300 mg by mouth in the morning, at noon, and at bedtime.   Marland Kitchen lisinopril-hydrochlorothiazide (ZESTORETIC) 20-12.5 MG tablet Take 1 tablet by mouth daily.  Marland Kitchen SANTYL ointment Apply 1 application topically daily as needed (wound care).   . SSD 1 % cream Apply 1 application topically daily as needed (wound care).   . traMADol (ULTRAM) 50 MG tablet Take 50 mg by mouth 3 (three) times daily as needed (pain).    Current  Facility-Administered Medications for the 02/27/20 encounter (Office Visit) with Lorretta Harp, MD  Medication  . sodium chloride flush (NS) 0.9 % injection 3 mL     No Known Allergies  Social History   Socioeconomic History  . Marital status: Legally Separated    Spouse name: Not on file  . Number of children: Not on file  . Years of education: Not on file  . Highest education level: Not on file  Occupational History  . Not on file  Tobacco Use  . Smoking status: Current Every Day Smoker    Packs/day: 0.50    Years: 40.00    Pack years: 20.00    Types: Cigarettes  . Smokeless tobacco: Never Used  Vaping Use  . Vaping Use: Never used  Substance and Sexual Activity  . Alcohol use: Yes    Comment: beer and liquor daily  . Drug use: Yes    Frequency: 3.0 times per week    Types: Marijuana  . Sexual activity: Not on file  Other Topics Concern  . Not on file  Social History Narrative  . Not on file   Social Determinants of Health   Financial Resource Strain: Not on file  Food Insecurity: Not on file  Transportation Needs: Not on file  Physical Activity: Not on file  Stress: Not on file  Social Connections: Not on file  Intimate Partner Violence: Not on file     Review of Systems: General: negative for chills, fever, night sweats or weight changes.  Cardiovascular: negative for chest pain, dyspnea on exertion, edema, orthopnea, palpitations, paroxysmal nocturnal dyspnea or shortness of breath Dermatological: negative for rash Respiratory: negative for cough or wheezing Urologic: negative for hematuria Abdominal: negative for nausea, vomiting, diarrhea, bright red blood per rectum, melena, or hematemesis Neurologic: negative for visual changes, syncope, or dizziness All other systems reviewed and are otherwise negative except as noted above.    Blood pressure 108/66, pulse 94, height '5\' 2"'  (1.575 m), weight 106 lb (48.1 kg).  General appearance: alert and no  distress Neck: no adenopathy, no carotid bruit, no JVD, supple, symmetrical, trachea midline and thyroid not enlarged, symmetric, no tenderness/mass/nodules Lungs: clear to auscultation bilaterally Heart: regular rate and rhythm, S1, S2 normal, no murmur, click, rub or gallop Extremities: extremities normal, atraumatic, no cyanosis or edema Pulses: 2+ and symmetric Absent pedal pulses Skin: Shallow ischemic wound dorsal aspect of right foot Neurologic: Alert and oriented X 3, normal strength and tone. Normal symmetric reflexes. Normal coordination and gait  EKG not performed today  ASSESSMENT AND PLAN:   Critical ischemia of foot Mr. Mcglasson returns today for follow-up.  He follows with Dr. Dellia Nims at the wound care clinic for a slowly healing wound on the dorsal aspect of his right foot.  He has had 2 interventions on this, one in July 2021 where I revascularized an occluded right SFA.  He had one-vessel runoff.  He did have spasm of his peroneal which ultimately was shown  to have resolved repeat CT angiography.  In November he had stenting of a calcified physiologically significant right external iliac artery.  His anterior tibial artery does not reach past the ankle and therefore revascularization would be difficult.  I would continue with aggressive local wound care.  He also has claudication on the left.  I attempted left SFA intervention in July but was unsuccessful in reentering the distal lumen.  Unfortunately, he only has 1 patent tibial vessel on the left common anterior tibial, and no evidence of critical limb ischemia so I am hesitant to recommend tibial pedal access for a retrograde approach.      Lorretta Harp MD FACP,FACC,FAHA, [facility] 02/27/2020 9:59 AM

## 2020-03-07 ENCOUNTER — Other Ambulatory Visit: Payer: Self-pay

## 2020-03-07 ENCOUNTER — Encounter (HOSPITAL_BASED_OUTPATIENT_CLINIC_OR_DEPARTMENT_OTHER): Payer: Medicare Other | Attending: Internal Medicine | Admitting: Internal Medicine

## 2020-03-07 DIAGNOSIS — E1151 Type 2 diabetes mellitus with diabetic peripheral angiopathy without gangrene: Secondary | ICD-10-CM | POA: Insufficient documentation

## 2020-03-07 DIAGNOSIS — L97512 Non-pressure chronic ulcer of other part of right foot with fat layer exposed: Secondary | ICD-10-CM | POA: Insufficient documentation

## 2020-03-07 DIAGNOSIS — L97519 Non-pressure chronic ulcer of other part of right foot with unspecified severity: Secondary | ICD-10-CM | POA: Diagnosis present

## 2020-03-07 DIAGNOSIS — F17218 Nicotine dependence, cigarettes, with other nicotine-induced disorders: Secondary | ICD-10-CM | POA: Insufficient documentation

## 2020-03-07 DIAGNOSIS — I10 Essential (primary) hypertension: Secondary | ICD-10-CM | POA: Diagnosis not present

## 2020-03-07 DIAGNOSIS — E11621 Type 2 diabetes mellitus with foot ulcer: Secondary | ICD-10-CM | POA: Insufficient documentation

## 2020-03-07 NOTE — Progress Notes (Signed)
SHAKIM, KALRA ([id number]) Visit Report for 03/07/2020 Debridement Details Patient Name: Date of Service: [name] 03/07/2020 12:30 PM Medical Record Number: [id number] Patient Account Number: [id number] Date of Birth/Sex: Treating RN: Dec 03, 1960 (60 y.o. [name] Primary Care Provider: Kennon Rounds, [facility] Other Clinician: Referring Provider: Treating Provider/Extender: [name] in Treatment: 29 Debridement Performed for Assessment: Wound #1 Right,Dorsal Foot Performed By: Physician Ricard Dillon., MD Debridement Type: Debridement Severity of Tissue Pre Debridement: Necrosis of muscle Level of Consciousness (Pre-procedure): Awake and Alert Pre-procedure Verification/Time Out Yes - 13:00 Taken: Start Time: 13:03 Pain Control: Lidocaine 4% T opical Solution T Area Debrided (L x W): otal 1.5 (cm) x 1.5 (cm) = 2.25 (cm) Tissue and other material debrided: Non-Viable, Tendon Level: Skin/Subcutaneous Tissue/Muscle Debridement Description: Excisional Instrument: Forceps, Scissors Bleeding: None End Time: 13:05 Procedural Pain: 5 Post Procedural Pain: 3 Response to Treatment: Procedure was tolerated well Level of Consciousness (Post- Awake and Alert procedure): Post Debridement Measurements of Total Wound Length: (cm) 1.5 Width: (cm) 1.5 Depth: (cm) 0.3 Volume: (cm) 0.53 Character of Wound/Ulcer Post Debridement: Requires Further Debridement Severity of Tissue Post Debridement: Necrosis of muscle Post Procedure Diagnosis Same as Pre-procedure Electronic Signature(s) Signed: 03/07/2020 5:36:32 PM By: Linton Ham MD Signed: 03/07/2020 5:49:38 PM By: Baruch Gouty RN, BSN Entered By: Linton Ham on 03/07/2020 13:11:26 -------------------------------------------------------------------------------- HPI Details Patient Name: Date of Service: [name], Ruben Kalata J. 03/07/2020 12:30 PM Medical Record Number:  [id number] Patient Account Number: [id number] Date of Birth/Sex: Treating RN: [birth year] (60 y.o. [name] Primary Care Provider: Kennon Rounds, [facility] Other Clinician: Referring Provider: Treating Provider/Extender: Domenic Polite Weeks in Treatment: 29 History of Present Illness HPI Description: 08/15/2019 upon evaluation today patient presents for evaluation here in our clinic concerning issues that he is having with wounds on the dorsal surface of his right foot. This is something that was noted to occur after he was obtaining cortisone injections with [facility]. Subsequently he developed ulcerations he was then referred to Dr. Alvester Chou who performed arterial testing and subsequently found that the patient had poor arterial flow with an ABI of 0.6 and a TBI of 0.26. With that being said he has since on 07/16/2019 had an angiogram on the right with improvement of his ABI to 0.85 and TBI to 0.5. Obviously this is not perfect but is definitely far superior to where it was previous. He does have some necrotic tissue on the surface of the wounds is going to need debriding away he has been using Silvadene on this but to be honest that is not can to do anything for these wounds. He needs something more to clear this away sharp debridement is ideal and we may even look towards Santyl as well. The patient does have a history of diabetes noted in his chart though is not on medication and his A1c was around 6.4 so is not terrible. With that being said he does have known peripheral vascular disease he is actually have an angiogram on the left tomorrow. He also has hypertension and he does smoke. 8/27; this is a patient has been here once before about 6 weeks ago. Since then he was hospitalized from 7/22 through 7/23 by Dr. Gwenlyn Found for an attempt at revascularization. He had previously been seen by Dr. Amalia Hailey of podiatry. He is a continued tobacco smoker. His angiogram on 6/21 via  revealed a occluded SFAs bilaterally with one-vessel  runoff. He had a atherectomy followed by a drug-coated balloon angioplasty of the right SFA. 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 He had some improvement in his right ABI from 0.6-0.85. There is also an attempt to open his left SFA but that was unsuccessful. He does not have a wound on the left side. We are using Santyl on the patient's wounds on the right dorsal foot. He says he has about a 1 minute exercise tolerance before stopping because of pain compatible with claudication 9/10; we are using Santyl on the wound on the right dorsal foot. Still has significant claudication symptoms. He is status post angioplasty of the right SFA and atherectomy. He has one-vessel runoff via a diseased peroneal. He follows up with Dr. Gwenlyn Found on 9/14 9/17; I had my notes from last week that the patient was to see Dr. Gwenlyn Found on 9/14 he tells me that the appointment is actually on 9/21 although I do not see that in epic. The next appointment I see with Dr. Gwenlyn Found is on 11/17. Nevertheless the patient is fairly adamant that his appointment is early next week I told him to call ahead before he goes. The wound is a lot better we have been using Santyl over that he just ran out of that all represcribe it. 10/1; patient arrives today with nothing on his wound. He says he has been using the Santyl but he feels the border foam is too painful. We told him he needs to put a covering on this of some sort perhaps gauze. Indeed after our discussion last time his appointment with Dr. Gwenlyn Found 11/17. Previously he has had a atherectomy and an angioplasty of the right SFA. I note in my previous notes it was said he had bilateral one-vessel runoff I'll need to review the tibial vessels on the angiogram. The patient is currently rating his pain at 8 out of 10 He followed up with Dr. Am[facility] Hailey at [facility] on 9/27. He emphasized to follow-up with  vascular and [place]. Follow-up with them as needed. 10/15; patient ran out of Santyl and has been using Silvadene. He still has constant pain which I think is largely claudication. I have looked over his angiogram results. I need to communicate with Dr. Gwenlyn Found to see if anything else can be done here. He arrives in clinic today with tendon over most of the wound surface area 10/29; we are using silver collagen on the right foot. Still complaining of a lot of pain especially at night. Dr. Gwenlyn Found is out of town this week I will send him an email but he has a follow-up appointment on 11/17. 11/18; patient saw Dr. Gwenlyn Found yesterday. He has a history of a directional atherectomy followed by a drug-coated balloon angioplasty on the right SFA. 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 His right ABI increased from 0.6 2.85 however the right foot wound has not budged. He is going next Wednesday for an attempt to do tibial artery interventions. He mentions that he has one-vessel runoff via the peroneal.. They are going to attempt to look at the anterior tibial and posterior tibial revascularization next Wednesday. If they were successful at this then I will consider debridement and changing the topical dressings to this wound. Patient is still in a lot of pain 12/2; the patient underwent revascularization by Dr. Fletcher Anon on 12/19/2019. 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 The posterior tibial artery gives good flow into the whole pedal arch getting collaterals from the peroneal artery the anterior tibial artery was occluded. Unfortunately his right anterior tibial artery is not amenable for revascularization. He had a drug-eluting stent placed in the right common iliac artery  into the external iliac artery. The patient arrives saying his pain is about the same. He still has exposed tendon in the base of the wound however there is some healthy looking granulation at the circumference. Perhaps reason for some guarded optimism. Comes in today not wanting debridement asking for pain medication 02/22/2020; patient has not been here in almost 2 months. He has a punched-out area on the right dorsal foot. Completely necrotic surface. We have been using silver collagen when he was last here the beginning of December he ran out of this a long time ago. I think he has been using Silvadene cream. He follows up with Dr. Fletcher Anon or Dr. Gwenlyn Found next week. He arrives in clinic as usual not wanting debridement unfortunately its debridement is what he needs 2/11; this area on the right foot looks somewhat better than when I saw this 2 weeks ago. I did receive a call from Dr. Gwenlyn Found who went over his PAD history. He had a directional atherectomy followed by drug coated balloon angioplasty of his right SFA on 07/16/2019 his right ABI increased from 0.6-0.85 at that point. He underwent a right external iliac artery stenting by Dr. Fletcher Anon on 12/19/2019 his right SFA was patent as was his peroneal. He did not have a patent posterior tibial at the level of the ankle the only patent vessel to his foot is the anterior tibial artery although that is fortunate in that that is where the or the major wound is. He was not felt to be a candidate for any further angiographic intervention Electronic Signature(s) Signed: 03/07/2020 5:36:32 PM By: Linton Ham MD Entered By: Linton Ham on 03/07/2020 13:13:28 -------------------------------------------------------------------------------- Physical Exam Details Patient Name: Date of Service: Ruben Leavell J. 03/07/2020 12:30 PM Medical Record Number: [id number] Patient Account Number: [id number] Date of Birth/Sex: Treating RN: 1960-12-14 (60 y.o. [name] Primary Care Provider: Kennon Rounds, [facility] Other Clinician: Referring Provider: Treating Provider/Extender: Domenic Polite Weeks in Treatment: 29 Constitutional Sitting or standing Blood Pressure is within target range for patient.. Pulse regular and within target range for patient.Marland Kitchen Respirations regular, non-labored and within target range.. Temperature is normal and within the target range for the patient.Marland Kitchen Appears in no distress. Cardiovascular Dorsalis pedis pulse on the right is difficult to feel. Notes Wound exam; dorsal right foot. This had denuded tendon on the surface that I removed with pickups insert scissors. There is still debris on the surface of this that is going to need for further debridement. Electronic Signature(s) Signed: 03/07/2020 5:36:32 PM By: Linton Ham MD Entered By: Linton Ham on 03/07/2020 13:15:09 -------------------------------------------------------------------------------- Physician Orders Details Patient Name: Date of Service: [name], Ruben Kalata J. 03/07/2020 12:30 PM Medical Record Number: [id number] Patient Account Number: [id number] Date of Birth/Sex: Treating RN: February 26, 1960 (60 y.o. [name] Primary Care Provider: Kennon Rounds, [facility] Other Clinician: Referring Provider: Treating Provider/Extender: [name] in Treatment: 29 Verbal / Phone Orders: No Diagnosis Coding ICD-10 Coding Code  Description I73.89 Other specified peripheral vascular diseases E11.621 Type 2 diabetes mellitus with foot ulcer L97.512 Non-pressure chronic ulcer of other part of right foot with fat layer exposed I10 Essential (primary) hypertension F17.218 Nicotine dependence, cigarettes, with other nicotine-induced disorders Follow-up Appointments Return Appointment in 2 weeks. Bathing/ Shower/ Hygiene May shower and wash wound with soap and water. - with dressing changes. Edema Control -  Lymphedema / SCD / Other Elevate legs to the level of the heart or above for 30 minutes daily and/or when sitting, a frequency of: - 3-4 times a throughout the day. Avoid standing for long periods of time. Off-Loading Wound #1 Right,Dorsal Foot Open toe surgical shoe to: - ensure no pressure to top of foot. Wound Treatment Wound #1 - Foot Wound Laterality: Dorsal, Right Prim Dressing: Santyl Ointment 1 x Per Day/30 Days ary Discharge Instructions: Apply nickel thick amount to wound bed as instructed Secondary Dressing: ComfortFoam Border, 3x3 in (silicone border) (Generic) 1 x Per Day/30 Days Discharge Instructions: Apply over primary dressing as directed. Electronic Signature(s) Signed: 03/07/2020 5:36:32 PM By: Linton Ham MD Signed: 03/07/2020 5:49:38 PM By: Baruch Gouty RN, BSN Entered By: Baruch Gouty on 03/07/2020 13:07:56 -------------------------------------------------------------------------------- Problem List Details Patient Name: Date of Service: [name], Ruben Kalata J. 03/07/2020 12:30 PM Medical Record Number: [id number] Patient Account Number: [id number] Date of Birth/Sex: Treating RN: April 16, 1960 (60 y.o. [name] Primary Care Provider: Kennon Rounds, [facility] Other Clinician: Referring Provider: Treating Provider/Extender: Domenic Polite Weeks in Treatment: 29 Active Problems ICD-10 Encounter Code Description Active Date MDM Diagnosis I73.89 Other specified peripheral vascular diseases 08/15/2019 No Yes E11.621 Type 2 diabetes mellitus with foot ulcer 08/15/2019 No Yes L97.512 Non-pressure chronic ulcer of other part of right foot with fat layer exposed 08/15/2019 No Yes I10 Essential (primary) hypertension 08/15/2019 No Yes F17.218 Nicotine dependence, cigarettes, with other nicotine-induced disorders 08/15/2019 No Yes Inactive Problems Resolved Problems Electronic Signature(s) Signed: 03/07/2020 5:36:32 PM By: Linton Ham  MD Entered By: Linton Ham on 03/07/2020 13:10:52 -------------------------------------------------------------------------------- Progress Note Details Patient Name: Date of Service: [name], Ruben Kalata J. 03/07/2020 12:30 PM Medical Record Number: [id number] Patient Account Number: [id number] Date of Birth/Sex: Treating RN: [birth year] (60 y.o. [name] Primary Care Provider: Kennon Rounds, [facility] Other Clinician: Referring Provider: Treating Provider/Extender: Domenic Polite Weeks in Treatment: 29 Subjective History of Present Illness (HPI) 08/15/2019 upon evaluation today patient presents for evaluation here in our clinic concerning issues that he is having with wounds on the dorsal surface of his right foot. This is something that was noted to occur after he was obtaining cortisone injections with [facility]. Subsequently he developed ulcerations he was then referred to Dr. Alvester Chou who performed arterial testing and subsequently found that the patient had poor arterial flow with an ABI of 0.6 and a TBI of 0.26. With that being said he has since on 07/16/2019 had an angiogram on the right with improvement of his ABI to 0.85 and TBI to 0.5. Obviously this is not perfect but is definitely far superior to where it was previous. He does have some necrotic tissue on the surface of the wounds is going to need debriding away he has been using Silvadene on this but to be honest that is not can to do anything for these wounds. He needs something more to clear this away sharp debridement is ideal and we may even look towards Santyl as well. The patient does have a  history of diabetes noted in his chart though is not on medication and his A1c was around 6.4 so is not terrible. With that being said he does have known peripheral vascular disease he is actually have an angiogram on the left tomorrow. He also has hypertension and he does smoke. 8/27; this is a patient has  been here once before about 6 weeks ago. Since then he was hospitalized from 7/22 through 7/23 by Dr. Gwenlyn Found for an attempt at revascularization. He had previously been seen by Dr. Amalia Hailey of podiatry. He is a continued tobacco smoker. His angiogram on 6/21 via revealed a occluded SFAs bilaterally with one-vessel runoff. He had a atherectomy followed by a drug-coated balloon angioplasty of the right SFA. 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 He had some improvement in his right ABI from 0.6-0.85. There is also an attempt to open his left SFA but that was unsuccessful. He does not have a wound on the left side. We are using Santyl on the patient's wounds on the right dorsal foot. He says he has about a 1 minute exercise tolerance before stopping because of pain compatible with claudication 9/10; we are using Santyl on the wound on the right dorsal foot. Still has significant claudication symptoms. He is status post angioplasty of the right SFA and atherectomy. He has one-vessel runoff via a diseased peroneal. He follows up with Dr. Gwenlyn Found on 9/14 9/17; I had my notes from last week that the patient was to see Dr. Gwenlyn Found on 9/14 he tells me that the appointment is actually on 9/21 although I do not see that in epic. The next appointment I see with Dr. Gwenlyn Found is on 11/17. Nevertheless the patient is fairly adamant that his appointment is early next week I told him to call ahead before he goes. The wound is a lot better we have been using Santyl over that he just ran out of that all represcribe it. 10/1; patient arrives today with nothing on his wound. He says he has been using the Santyl but he feels the border foam is too painful. We told him he needs to put a covering on this of some sort perhaps gauze. Indeed after our discussion last time his appointment with Dr. Gwenlyn Found 11/17. Previously he has had a atherectomy and an angioplasty of the right SFA. I note in my previous notes  it was said he had bilateral one-vessel runoff I'll need to review the tibial vessels on the angiogram. The patient is currently rating his pain at 8 out of 10 He followed up with Dr. Am[facility] Hailey at [facility] on 9/27. He emphasized to follow-up with vascular and [place]. Follow-up with them as needed. 10/15; patient ran out of Santyl and has been using Silvadene. He still has constant pain which I think is largely claudication. I have looked over his angiogram results. I need to communicate with Dr. Gwenlyn Found to see if anything else can be done here. He arrives in clinic today with tendon over most of the wound surface area 10/29; we are using silver collagen on the right foot. Still complaining of a lot of pain especially at night. Dr. Gwenlyn Found is out of town this week I will send him an email but he has a follow-up appointment on 11/17. 11/18; patient saw Dr. Gwenlyn Found yesterday. He has a history of a directional atherectomy followed by a drug-coated balloon angioplasty on the right SFA. 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 His right ABI increased from 0.6 2.85 however the right foot wound has not budged. He is going next Wednesday for an attempt to do tibial artery interventions. He mentions that he has one-vessel runoff via the peroneal.. They are going to attempt to look at the anterior tibial and posterior tibial revascularization next Wednesday. If they were successful at this then I will consider debridement and changing the topical dressings to this wound. Patient is still in a lot of pain 12/2; the patient underwent revascularization by Dr. Fletcher Anon on 12/19/2019. 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 The posterior tibial artery gives good flow into the  whole pedal arch getting collaterals from the peroneal artery the anterior tibial artery was occluded. Unfortunately his right anterior tibial artery is not amenable for revascularization. He had a drug-eluting stent placed in the right common iliac artery into the external iliac artery. The patient arrives saying his pain is about the same. He still has exposed tendon in the base of the wound however there is some healthy looking granulation at the circumference. Perhaps reason for some guarded optimism. Comes in today not wanting debridement asking for pain medication 02/22/2020; patient has not been here in almost 2 months. He has a punched-out area on the right dorsal foot. Completely necrotic surface. We have been using silver collagen when he was last here the beginning of December he ran out of this a long time ago. I think he has been using Silvadene cream. He follows up with Dr. Fletcher Anon or Dr. Gwenlyn Found next week. He arrives in clinic as usual not wanting debridement unfortunately its debridement is what he needs 2/11; this area on the right foot looks somewhat better than when I saw this 2 weeks ago. I did receive a call from Dr. Gwenlyn Found who went over his PAD history. He had a directional atherectomy followed by drug coated balloon angioplasty of his right SFA on 07/16/2019 his right ABI increased from 0.6-0.85 at that point. He underwent a right external iliac artery stenting by Dr. Fletcher Anon on 12/19/2019 his right SFA was patent as was his peroneal. He did not have a patent posterior tibial at the level of the ankle the only patent vessel to his foot is the anterior tibial artery although that is fortunate in that that is where the or the major wound is. He was not felt to be a candidate for any further angiographic intervention Objective Constitutional Sitting or standing Blood Pressure is within target range for patient.. Pulse regular and within target range for patient.Marland Kitchen Respirations regular,  non-labored and within target range.. Temperature is normal and within the target range for the patient.Marland Kitchen Appears in no distress. Vitals Time Taken: 12:34 PM, Height: 62 in, Weight: 130 lbs, BMI: 23.8, Temperature: 98.5 F, Pulse: 77 bpm, Respiratory Rate: 16 breaths/min, Blood Pressure: 136/83 mmHg. Cardiovascular Dorsalis pedis pulse on the right is difficult to feel. General Notes: Wound exam; dorsal right foot. This had denuded tendon on the surface that I removed with pickups insert scissors. There is still debris on the surface of this that is going to need for further debridement. Integumentary (Hair, Skin) Wound #1 status is Open. Original cause of wound was Gradually Appeared. The wound is located on  the Right,Dorsal Foot. The wound measures 1.5cm length x 1.5cm width x 0.3cm depth; 1.767cm^2 area and 0.53cm^3 volume. There is tendon and Fat Layer (Subcutaneous Tissue) exposed. There is no tunneling or undermining noted. There is a medium amount of serous drainage noted. The wound margin is flat and intact. There is no granulation within the wound bed. There is a large (67-100%) amount of necrotic tissue within the wound bed including Adherent Slough. Assessment Active Problems ICD-10 Other specified peripheral vascular diseases Type 2 diabetes mellitus with foot ulcer Non-pressure chronic ulcer of other part of right foot with fat layer exposed Essential (primary) hypertension Nicotine dependence, cigarettes, with other nicotine-induced disorders Procedures Wound #1 Pre-procedure diagnosis of Wound #1 is a Diabetic Wound/Ulcer of the Lower Extremity located on the Right,Dorsal Foot .Severity of Tissue Pre Debridement is: Necrosis of muscle. There was a Excisional Skin/Subcutaneous Tissue/Muscle Debridement with a total area of 2.25 sq cm performed by Ricard Dillon., MD. With the following instrument(s): Forceps, and Scissors to remove Non-Viable tissue/material. Material  removed includes T endon after achieving pain control using Lidocaine 4% T opical Solution. No specimens were taken. A time out was conducted at 13:00, prior to the start of the procedure. There was no bleeding. The procedure was tolerated well with a pain level of 5 throughout and a pain level of 3 following the procedure. Post Debridement Measurements: 1.5cm length x 1.5cm width x 0.3cm depth; 0.53cm^3 volume. Character of Wound/Ulcer Post Debridement requires further debridement. Severity of Tissue Post Debridement is: Necrosis of muscle. Post procedure Diagnosis Wound #1: Same as Pre-Procedure Plan Follow-up Appointments: Return Appointment in 2 weeks. Bathing/ Shower/ Hygiene: May shower and wash wound with soap and water. - with dressing changes. Edema Control - Lymphedema / SCD / Other: Elevate legs to the level of the heart or above for 30 minutes daily and/or when sitting, a frequency of: - 3-4 times a throughout the day. Avoid standing for long periods of time. Off-Loading: Wound #1 Right,Dorsal Foot: Open toe surgical shoe to: - ensure no pressure to top of foot. WOUND #1: - Foot Wound Laterality: Dorsal, Right Prim Dressing: Santyl Ointment 1 x Per Day/30 Days ary Discharge Instructions: Apply nickel thick amount to wound bed as instructed Secondary Dressing: ComfortFoam Border, 3x3 in (silicone border) (Generic) 1 x Per Day/30 Days Discharge Instructions: Apply over primary dressing as directed. 1. I am continuing with the Santyl. At some point he is going to need mechanical debridement which she does not tolerate well 2. I spoke with Dr. Gwenlyn Found and he was quite clear that he did not feel he needed any further interventions at this point. He has a follow-up in 6 months presumably for noninvasive study follow-up Electronic Signature(s) Signed: 03/07/2020 5:36:32 PM By: Linton Ham MD Entered By: Linton Ham on 03/07/2020  13:15:54 -------------------------------------------------------------------------------- SuperBill Details Patient Name: Date of Service: [name], [name] 03/07/2020 Medical Record Number: [id number] Patient Account Number: [id number] Date of Birth/Sex: Treating RN: 10-Oct-1960 (60 y.o. [name] Primary Care Provider: Kennon Rounds, [facility] Other Clinician: Referring Provider: Treating Provider/Extender: Domenic Polite Weeks in Treatment: 29 Diagnosis Coding ICD-10 Codes Code Description I73.89 Other specified peripheral vascular diseases E11.621 Type 2 diabetes mellitus with foot ulcer L97.512 Non-pressure chronic ulcer of other part of right foot with fat layer exposed I10 Essential (primary) hypertension F17.218 Nicotine dependence, cigarettes, with other nicotine-induced disorders Facility Procedures CPT4 Code: [id number] Description: [id number] - DEB MUSC/FASCIA 20 SQ CM/< ICD-10  Diagnosis Description L97.512 Non-pressure chronic ulcer of other part of right foot with fat layer exposed Modifier: Quantity: 1 Physician Procedures : CPT4 Code Description Modifier [id number] - WC PHYS DEBR MUSCLE/FASCIA 20 SQ CM ICD-10 Diagnosis Description L97.512 Non-pressure chronic ulcer of other part of right foot with fat layer exposed Quantity: 1 Electronic Signature(s) Signed: 03/07/2020 5:36:32 PM By: Linton Ham MD Entered By: Linton Ham on 03/07/2020 13:16:08

## 2020-03-10 NOTE — Progress Notes (Signed)
[name], [name] ([id number]) Visit Report for 03/07/2020 Arrival Information Details Patient Name: Date of Service: [name] 03/07/2020 12:30 PM Medical Record Number: [id number] Patient Account Number: [id number] Date of Birth/Sex: Treating RN: [birth year] (60 y.o. Janyth Contes Primary Care Jaxsen Bernhart: Kennon Rounds, [facility] Other Clinician: Referring Rishab Stoudt: Treating Peola Joynt/Extender: [name] in Treatment: 29 Visit Information History Since Last Visit Added or deleted any medications: No Patient Arrived: Ambulatory Any new allergies or adverse reactions: No Arrival Time: 12:34 Had a fall or experienced change in No Accompanied By: alone activities of daily living that may affect Transfer Assistance: None risk of falls: Patient Identification Verified: Yes Signs or symptoms of abuse/neglect since last visito No Secondary Verification Process Completed: Yes Hospitalized since last visit: No Patient Requires Transmission-Based Precautions: No Implantable device outside of the clinic excluding No Patient Has Alerts: Yes cellular tissue based products placed in the center Patient Alerts: Patient on Blood Thinner since last visit: R ABI: 0.85 R TBI: 0.50 Has Dressing in Place as Prescribed: Yes Pain Present Now: No Electronic Signature(s) Signed: 03/10/2020 5:50:50 PM By: Levan Hurst RN, BSN Entered By: Levan Hurst on 03/07/2020 12:34:28 -------------------------------------------------------------------------------- Encounter Discharge Information Details Patient Name: Date of Service: [name], Ruben Kalata J. 03/07/2020 12:30 PM Medical Record Number: [id number] Patient Account Number: [id number] Date of Birth/Sex: Treating RN: December 16, 1960 (60 y.o. [name] Primary Care Edd Reppert: Kennon Rounds, [facility] Other Clinician: Referring Zoriyah Scheidegger: Treating Genella Bas/Extender: [name] in Treatment:  29 Encounter Discharge Information Items Post Procedure Vitals Discharge Condition: Stable Temperature (F): 98.5 Ambulatory Status: Ambulatory Pulse (bpm): 77 Discharge Destination: Home Respiratory Rate (breaths/min): 16 Transportation: Private Auto Blood Pressure (mmHg): 136/83 Accompanied By: self Schedule Follow-up Appointment: Yes Clinical Summary of Care: Electronic Signature(s) Signed: 03/07/2020 5:46:28 PM By: Deon Pilling Entered By: Deon Pilling on 03/07/2020 14:36:59 -------------------------------------------------------------------------------- Lower Extremity Assessment Details Patient Name: Date of Service: [name] 03/07/2020 12:30 PM Medical Record Number: [id number] Patient Account Number: [id number] Date of Birth/Sex: Treating RN: 1960/05/24 (60 y.o. Janyth Contes Primary Care Shermon Bozzi: Kennon Rounds, [facility] Other Clinician: Referring Shritha Bresee: Treating Juddson Cobern/Extender: Domenic Polite Weeks in Treatment: 29 Edema Assessment Assessed: Shirlyn Goltz: No] Patrice Paradise: No] Edema: [Left: N] [Right: o] Calf Left: Right: Point of Measurement: 28 cm From Medial Instep 29 cm Ankle Left: Right: Point of Measurement: 9 cm From Medial Instep 18 cm Vascular Assessment Pulses: Dorsalis Pedis Palpable: [Right:Yes] Electronic Signature(s) Signed: 03/10/2020 5:50:50 PM By: Levan Hurst RN, BSN Entered By: Levan Hurst on 03/07/2020 12:38:41 -------------------------------------------------------------------------------- Multi Wound Chart Details Patient Name: Date of Service: Ruben Leavell J. 03/07/2020 12:30 PM Medical Record Number: [id number] Patient Account Number: [id number] Date of Birth/Sex: Treating RN: [birth year] (60 y.o. 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] [N/A:N/A] Secondary Etiology: [1:Hypertension, Peripheral Arterial] [N/A:N/A] Comorbid History: [1:Disease, Type II Diabetes, Neuropathy 03/26/2019] [N/A:N/A] Date Acquired: [1:29] [N/A:N/A] Weeks of Treatment: [1:Open] [N/A:N/A] Wound Status: [1:1.5x1.5x0.3] [N/A:N/A] Measurements L x W x D (cm) [1:1.767] [N/A:N/A] A (cm) : rea [1:0.53] [N/A:N/A] Volume (  cm) : [1:37.50%] [N/A:N/A] % Reduction in A rea: [1:-87.30%] [N/A:N/A] % Reduction in Volume: [1:Grade 2] [N/A:N/A] Classification: [1:Medium] [N/A:N/A] Exudate A mount: [1:Serous] [N/A:N/A] Exudate Type: [1:amber] [N/A:N/A] Exudate Color: [1:Flat and Intact] [N/A:N/A] Wound Margin: [1:None Present (0%)] [N/A:N/A] Granulation A mount: [1:Large (67-100%)] [N/A:N/A] Necrotic A mount: [1:Fat Layer (Subcutaneous Tissue): Yes N/A] Exposed Structures: [1:Tendon: Yes Fascia: No Muscle: No Joint: No Bone: No Small (1-33%)] [N/A:N/A] Epithelialization: [1:Debridement - Excisional] [N/A:N/A] Debridement: Pre-procedure Verification/Time Out 13:00 [N/A:N/A] Taken: [1:Lidocaine 4% Topical Solution] [N/A:N/A] Pain Control: [1:Tendon] [N/A:N/A] Tissue Debrided: [1:Skin/Subcutaneous Tissue/Muscle] [N/A:N/A] Level: [1:2.25] [N/A:N/A] Debridement A (sq cm): [1:rea Forceps, Scissors] [N/A:N/A] Instrument: [1:None] [N/A:N/A] Bleeding: [1:5] [N/A:N/A] Procedural Pain: [1:3] [N/A:N/A] Post Procedural Pain: [1:Procedure was tolerated well] [N/A:N/A] Debridement Treatment Response: [1:1.5x1.5x0.3] [N/A:N/A] Post Debridement Measurements L x W x D (cm) [1:0.53] [N/A:N/A] Post  Debridement Volume: (cm) [1:Debridement] [N/A:N/A] Treatment Notes Electronic Signature(s) Signed: 03/07/2020 5:36:32 PM By: Linton Ham MD Signed: 03/07/2020 5:49:38 PM By: Baruch Gouty RN, BSN Entered By: Linton Ham on 03/07/2020 13:11:12 -------------------------------------------------------------------------------- Multi-Disciplinary Care Plan Details Patient Name: Date of Service: [place] RD, Ruben Kalata J. 03/07/2020 12:30 PM Medical Record Number: [id number] Patient Account Number: [id number] Date of Birth/Sex: Treating RN: 07/09/1960 (60 y.o. [name] Primary Care Pharrell Ledford: Kennon Rounds, [facility] Other Clinician: Referring Kery Batzel: Treating Adabelle Griffiths/Extender: Domenic Polite Weeks in Treatment: 29 Active Inactive Nutrition Nursing Diagnoses: Impaired glucose control: actual or potential Potential for alteratiion in Nutrition/Potential for imbalanced nutrition Goals: Patient/caregiver will maintain therapeutic glucose control Date Initiated: 08/15/2019 Target Resolution Date: 03/21/2020 Goal Status: Active Interventions: Assess HgA1c results as ordered upon admission and as needed Assess patient nutrition upon admission and as needed per policy Treatment Activities: Patient referred to Primary Care Physician for further nutritional evaluation : 08/15/2019 Notes: Wound/Skin Impairment Nursing Diagnoses: Impaired tissue integrity Knowledge deficit related to smoking impact on wound healing Knowledge deficit related to ulceration/compromised skin integrity Goals: Patient will demonstrate a reduced rate of smoking or cessation of smoking Date Initiated: 08/15/2019 Target Resolution Date: 03/21/2020 Goal Status: Active Patient/caregiver will verbalize understanding of skin care regimen Date Initiated: 08/15/2019 Date Inactivated: 12/27/2019 Target Resolution Date: 12/28/2019 Goal Status: Met Ulcer/skin breakdown will have a volume reduction of  30% by week 4 Date Initiated: 08/15/2019 Date Inactivated: 09/21/2019 Target Resolution Date: 09/12/2019 Goal Status: Met Interventions: Assess patient/caregiver ability to obtain necessary supplies Assess patient/caregiver ability to perform ulcer/skin care regimen upon admission and as needed Assess ulceration(s) every visit Provide education on ulcer and skin care Treatment Activities: Skin care regimen initiated : 08/15/2019 Topical wound management initiated : 08/15/2019 Notes: Electronic Signature(s) Signed: 03/07/2020 5:49:38 PM By: Baruch Gouty RN, BSN Entered By: Baruch Gouty on 03/07/2020 13:04:05 -------------------------------------------------------------------------------- Pain Assessment Details Patient Name: Date of Service: [name]. 03/07/2020 12:30 PM Medical Record Number: [id number] Patient Account Number: [id number] Date of Birth/Sex: Treating RN: 09-06-1960 (60 y.o. Janyth Contes Primary Care Delaynee Alred: Kennon Rounds, [facility] Other Clinician: Referring Iriel Nason: Treating Meily Glowacki/Extender: Domenic Polite Weeks in Treatment: 29 Active Problems Location of Pain Severity and Description of Pain Patient Has Paino No Site Locations Pain Management and Medication Current Pain Management: Electronic Signature(s) Signed: 03/10/2020 5:50:50 PM By: Levan Hurst RN, BSN Entered By: Levan Hurst on 03/07/2020 12:34:46 -------------------------------------------------------------------------------- Patient/Caregiver Education Details Patient Name: Date of Service: [name] 2/11/2022andnbsp12:30 PM Medical Record Number: [id number] Patient Account Number: [id number] Date of Birth/Gender: Treating RN: 11-12-1960 (60 y.o. Ulyses Amor,  Vaughan Basta Primary Care Physician: Kennon Rounds, [facility] Other Clinician: Referring Physician: Treating Physician/Extender: [name] in Treatment: 29 Education  Assessment Education Provided To: Patient Education Topics Provided Wound/Skin Impairment: Methods: Explain/Verbal Responses: Reinforcements needed, State content correctly Electronic Signature(s) Signed: 03/07/2020 5:49:38 PM By: Baruch Gouty RN, BSN Entered By: Baruch Gouty on 03/07/2020 13:04:47 -------------------------------------------------------------------------------- Wound Assessment Details Patient Name: Date of Service: [name]. 03/07/2020 12:30 PM Medical Record Number: [id number] Patient Account Number: [id number] Date of Birth/Sex: Treating RN: 04-Nov-1960 (60 y.o. Janyth Contes Primary Care Margues Filippini: Kennon Rounds, [facility] Other Clinician: Referring Sherrel Shafer: Treating Jerett Odonohue/Extender: Domenic Polite Weeks in Treatment: 29 Wound Status Wound Number: 1 Primary Diabetic Wound/Ulcer of the Lower Extremity Etiology: Wound Location: Right, Dorsal Foot Secondary Arterial Insufficiency Ulcer Wounding Event: Gradually Appeared Etiology: Date Acquired: 03/26/2019 Wound Status: Open Weeks Of Treatment: 29 Comorbid Hypertension, Peripheral Arterial Disease, Type II Diabetes, Clustered Wound: No History: Neuropathy Wound Measurements Length: (cm) 1.5 Width: (cm) 1.5 Depth: (cm) 0.3 Area: (cm) 1.767 Volume: (cm) 0.53 % Reduction in Area: 37.5% % Reduction in Volume: -87.3% Epithelialization: Small (1-33%) Tunneling: No Undermining: No Wound Description Classification: Grade 2 Wound Margin: Flat and Intact Exudate Amount: Medium Exudate Type: Serous Exudate Color: amber Foul Odor After Cleansing: No Slough/Fibrino Yes Wound Bed Granulation Amount: None Present (0%) Exposed Structure Necrotic Amount: Large (67-100%) Fascia Exposed: No Necrotic Quality: Adherent Slough Fat Layer (Subcutaneous Tissue) Exposed: Yes Tendon Exposed: Yes Muscle Exposed: No Joint Exposed: No Bone Exposed: No Treatment Notes Wound #1 (Foot)  Wound Laterality: Dorsal, Right Cleanser Peri-Wound Care Topical Primary Dressing Santyl Ointment Discharge Instruction: Apply nickel thick amount to wound bed as instructed Secondary Dressing ComfortFoam Border, 3x3 in (silicone border) Discharge Instruction: Apply over primary dressing as directed. Secured With Compression Wrap Compression Stockings Environmental education officer) Signed: 03/10/2020 5:50:50 PM By: Levan Hurst RN, BSN Entered By: Levan Hurst on 03/07/2020 12:39:14 -------------------------------------------------------------------------------- Vitals Details Patient Name: Date of Service: [name], Ruben Kalata J. 03/07/2020 12:30 PM Medical Record Number: [id number] Patient Account Number: [id number] Date of Birth/Sex: Treating RN: 09/13/1960 (60 y.o. Janyth Contes Primary Care Doug Bucklin: Other Clinician: Kennon Rounds, [facility] Referring Zhoe Catania: Treating Kielee Care/Extender: [name] in Treatment: 29 Vital Signs Time Taken: 12:34 Temperature (F): 98.5 Height (in): 62 Pulse (bpm): 77 Weight (lbs): 130 Respiratory Rate (breaths/min): 16 Body Mass Index (BMI): 23.8 Blood Pressure (mmHg): 136/83 Reference Range: 80 - 120 mg / dl Electronic Signature(s) Signed: 03/10/2020 5:50:50 PM By: Levan Hurst RN, BSN Entered By: Levan Hurst on 03/07/2020 12:34:42

## 2020-03-21 ENCOUNTER — Encounter (HOSPITAL_BASED_OUTPATIENT_CLINIC_OR_DEPARTMENT_OTHER): Payer: Medicare Other | Admitting: Internal Medicine

## 2020-03-28 ENCOUNTER — Other Ambulatory Visit: Payer: Self-pay

## 2020-03-28 ENCOUNTER — Encounter (HOSPITAL_BASED_OUTPATIENT_CLINIC_OR_DEPARTMENT_OTHER): Payer: Medicare Other | Attending: Internal Medicine | Admitting: Physician Assistant

## 2020-03-28 DIAGNOSIS — E11621 Type 2 diabetes mellitus with foot ulcer: Secondary | ICD-10-CM | POA: Insufficient documentation

## 2020-03-28 DIAGNOSIS — F17218 Nicotine dependence, cigarettes, with other nicotine-induced disorders: Secondary | ICD-10-CM | POA: Insufficient documentation

## 2020-03-28 DIAGNOSIS — I10 Essential (primary) hypertension: Secondary | ICD-10-CM | POA: Insufficient documentation

## 2020-03-28 DIAGNOSIS — E1151 Type 2 diabetes mellitus with diabetic peripheral angiopathy without gangrene: Secondary | ICD-10-CM | POA: Insufficient documentation

## 2020-03-28 DIAGNOSIS — L97512 Non-pressure chronic ulcer of other part of right foot with fat layer exposed: Secondary | ICD-10-CM | POA: Insufficient documentation

## 2020-03-28 NOTE — Progress Notes (Addendum)
ESSIEN, BAHL ([id number]) Visit Report for 03/28/2020 Chief Complaint Document Details Patient Name: Date of Service: [name] 03/28/2020 2:30 PM Medical Record Number: [id number] Patient Account Number: [id number] Date of Birth/Sex: Treating RN: 1960/08/04 (60 y.o. [name], [facility] NNA Other Clinician: Referring Huang: Treating Huang/Extender: Finis Bud WE, SHA NNA Weeks in Treatment: 14 Information Obtained from: Patient Chief Complaint Right foot ulcers Electronic Signature(s) Signed: 03/28/2020 2:45:32 PM By: Worthy Keeler PA-C Entered By: Worthy Keeler on 03/28/2020 14:45:32 -------------------------------------------------------------------------------- Debridement Details Patient Name: Date of Service: [name], [name]. 03/28/2020 2:30 PM Medical Record Number: [id number] Patient Account Number: [id number] Date of Birth/Sex: Treating RN: Dec 18, 1960 (60 y.o. [name], [facility] NNA Other Clinician: Referring Huang: Treating Huang/Extender: Worthy Keeler STO WE, SHA NNA Weeks in Treatment: 32 Debridement Performed for Assessment: Wound #1 Right,Dorsal Foot Performed By: Clinician Deon Pilling, RN Debridement Type: Chemical/Enzymatic/Mechanical Agent Used: Santyl Severity of Tissue Pre Debridement: Fat layer exposed Level of Consciousness (Pre-procedure): Awake and Alert Pre-procedure Verification/Time Out No Taken: Bleeding: None Response to Treatment: Procedure was tolerated well Level of Consciousness (Post- Awake and Alert procedure): Post Debridement Measurements of Total Wound Length: (cm) 1.4 Width: (cm) 1.1 Depth: (cm) 0.3 Volume: (cm) 0.363 Character of Wound/Ulcer Post Debridement: Requires Further Debridement Severity of Tissue Post Debridement: Fat layer exposed Post Procedure Diagnosis Same as Pre-procedure Electronic  Signature(s) Signed: 03/28/2020 4:38:50 PM By: Worthy Keeler PA-C Signed: 03/28/2020 5:05:02 PM By: Ruben Gouty RN, BSN Entered By: [name] on 03/28/2020 15:47:33 -------------------------------------------------------------------------------- HPI Details Patient Name: Date of Service: [name], Ruben Kalata J. 03/28/2020 2:30 PM Medical Record Number: [id number] Patient Account Number: [id number] Date of Birth/Sex: Treating RN: 09-17-1960 (60 y.o. [name], [facility] NNA Other Clinician: Referring Huang: Treating Huang/Extender: Worthy Keeler STO WE, SHA NNA Weeks in Treatment: 32 History of Present Illness HPI Description: 08/15/2019 upon evaluation today patient presents for evaluation here in our clinic concerning issues that he is having with wounds on the dorsal surface of his right foot. This is something that was noted to occur after he was obtaining cortisone injections with [facility]. Subsequently he developed ulcerations he was then referred to Dr. Alvester Chou who performed arterial testing and subsequently found that the patient had poor arterial flow with an ABI of 0.6 and a TBI of 0.26. With that being said he has since on 07/16/2019 had an angiogram on the right with improvement of his ABI to 0.85 and TBI to 0.5. Obviously this is not perfect but is definitely far superior to where it was previous. He does have some necrotic tissue on the surface of the wounds is going to need debriding away he has been using Silvadene on this but to be honest that is not can to do anything for these wounds. He needs something more to clear this away sharp debridement is ideal and we may even look towards Santyl as well. The patient does have a history of diabetes noted in his chart though is not on medication and his A1c was around 6.4 so is not terrible. With that being said he does have known peripheral vascular disease he is actually have  an angiogram on the left tomorrow. He also has hypertension and he does smoke. 8/27; this is a patient has been here once before about 6 weeks ago. Since then  he was hospitalized from 7/22 through 7/23 by Dr. Gwenlyn Found for an attempt at revascularization. He had previously been seen by Dr. Amalia Hailey of podiatry. He is a continued tobacco smoker. His angiogram on 6/21 via revealed a occluded SFAs bilaterally with one-vessel runoff. He had a atherectomy followed by a drug-coated balloon angioplasty of the right SFA. 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 He had some improvement in his right ABI from 0.6-0.85. There is also an attempt to open his left SFA but that was unsuccessful. He does not have a wound on the left side. We are using Santyl on the patient's wounds on the right dorsal foot. He says he has about a 1 minute exercise tolerance before stopping because of pain compatible with claudication 9/10; we are using Santyl on the wound on the right dorsal foot. Still has significant claudication symptoms. He is status post angioplasty of the right SFA and atherectomy. He has one-vessel runoff via a diseased peroneal. He follows up with Dr. Gwenlyn Found on 9/14 9/17; I had my notes from last week that the patient was to see Dr. Gwenlyn Found on 9/14 he tells me that the appointment is actually on 9/21 although I do not see that in epic. The next appointment I see with Dr. Gwenlyn Found is on 11/17. Nevertheless the patient is fairly adamant that his appointment is early next week I told him to call ahead before he goes. The wound is a lot better we have been using Santyl over that he just ran out of that all represcribe it. 10/1; patient arrives today with nothing on his wound. He says he has been using the Santyl but he feels the border foam is too painful. We told him he needs to put a covering on this of some sort perhaps gauze. Indeed after our discussion last time his appointment with Dr. Gwenlyn Found  11/17. Previously he has had a atherectomy and an angioplasty of the right SFA. I note in my previous notes it was said he had bilateral one-vessel runoff I'll need to review the tibial vessels on the angiogram. The patient is currently rating his pain at 8 out of 10 He followed up with Dr. [facility] Ha[facility] at [facility] on 9/27. He emphasized to follow-up with vascular and [place]. Follow-up with them as needed. 10/15; patient ran out of Santyl and has been using Silvadene. He still has constant pain which I think is largely claudication. I have looked over his angiogram results. I need to communicate with Dr. Gwenlyn Found to see if anything else can be done here. He arrives in clinic today with tendon over most of the wound surface area 10/29; we are using silver collagen on the right foot. Still complaining of a lot of pain especially at night. Dr. Gwenlyn Found is out of town this week I will send him an email but he has a follow-up appointment on 11/17. 11/18; patient saw Dr. Gwenlyn Found yesterday. He has a history of a directional atherectomy followed by a drug-coated balloon angioplasty on the right SFA. 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 His right ABI increased from 0.6 2.85 however the right foot wound has not budged. He is going next Wednesday for an attempt to do tibial artery interventions. He mentions that he has one-vessel runoff via the peroneal.. They are going to attempt to look at the anterior tibial and posterior tibial revascularization next  Wednesday. If they were successful at this then I will consider debridement and changing the topical dressings to this wound. Patient is still in a lot of pain 12/2; the patient underwent revascularization by Dr. Fletcher Anon on 12/19/2019. 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 The posterior tibial artery gives good flow into the whole pedal arch getting collaterals from the peroneal artery the anterior tibial artery was occluded. Unfortunately his right anterior tibial artery is not amenable for revascularization. He had a drug-eluting stent placed in the right common iliac artery into the external iliac artery. The patient arrives saying his pain is about the same. He still has exposed tendon in the base of the wound however there is some healthy looking granulation at the circumference. Perhaps reason for some guarded optimism. Comes in today not wanting debridement asking for pain medication 02/22/2020; patient has not been here in almost 2 months. He has a punched-out area on the right dorsal foot. Completely necrotic surface. We have been using silver collagen when he was last here the beginning of December he ran out of this a long time ago. I think he has been using Silvadene cream. He follows up with Dr. Fletcher Anon or Dr. Gwenlyn Found next week. He arrives in clinic as usual not wanting debridement unfortunately its debridement is what he needs 2/11; this area on the right foot looks somewhat better than when I saw this 2 weeks ago. I did receive a call from Dr. Gwenlyn Found who went over his PAD history. He had a directional atherectomy followed by drug coated balloon angioplasty of his right SFA on 07/16/2019 his right ABI increased from 0.6-0.85 at that point. He underwent a right external iliac artery stenting by Dr. Fletcher Anon on 12/19/2019 his right SFA was patent as was his peroneal. He did not have a patent posterior tibial at the level of the ankle the only patent vessel to his foot is the anterior tibial artery although that is fortunate in that that is where the or the major wound is. He was not felt to be a candidate for any further angiographic intervention 03/28/2020 upon evaluation today patient appears to be doing well  with regard to his wound. Is been tolerating the dressing changes without complication. With that being said he has been using Santyl this definitely seems to be doing better than nothing is for breakdown some of the slough and biofilm on the surface of the wound. Fortunately there is no evidence of active infection which is great news. No fevers, chills, nausea, vomiting, or diarrhea. Electronic Signature(s) Signed: 03/28/2020 3:46:26 PM By: Worthy Keeler PA-C Signed: 03/28/2020 3:46:26 PM By: Worthy Keeler PA-C Entered By: Worthy Keeler on 03/28/2020 15:46:26 -------------------------------------------------------------------------------- Physical Exam Details Patient Name: Date of Service: [name] 03/28/2020 2:30 PM Medical Record Number: [id number] Patient Account Number: [id number] Date of Birth/Sex: Treating RN: [birth year] (60 y.o. [name], [facility] NNA Other Clinician: Referring Huang: Treating Huang/Extender: Worthy Keeler STO WE, SHA NNA Weeks in Treatment: 36 Constitutional Well-nourished and well-hydrated in no acute distress. Respiratory normal breathing without difficulty. Psychiatric this patient is able to make decisions and demonstrates good insight into disease process. Alert and Oriented x 3. pleasant and cooperative. Notes Patient's wound bed I again did have  a little bit of slough and biofilm that really would have benefited him to be able to debride this away even mechanically with saline and gauze. With that being said he told me know and would not allow me to do this. For that reason honestly would go ahead just continue with the Santyl I think is in a go much slower but there is really nothing more that I can do in that regard. Electronic Signature(s) Signed: 03/28/2020 3:46:49 PM By: Worthy Keeler PA-C Entered By: Worthy Keeler on 03/28/2020  15:46:48 -------------------------------------------------------------------------------- Physician Orders Details Patient Name: Date of Service: [name], [name]. 03/28/2020 2:30 PM Medical Record Number: [id number] Patient Account Number: [id number] Date of Birth/Sex: Treating RN: 09/30/1960 (60 y.o. [name], [facility] NNA Other Clinician: Referring Huang: Treating Huang/Extender: Worthy Keeler STO WE, SHA NNA Weeks in Treatment: 70 Verbal / Phone Orders: No Diagnosis Coding ICD-10 Coding Code Description I73.89 Other specified peripheral vascular diseases E11.621 Type 2 diabetes mellitus with foot ulcer L97.512 Non-pressure chronic ulcer of other part of right foot with fat layer exposed I10 Essential (primary) hypertension F17.218 Nicotine dependence, cigarettes, with other nicotine-induced disorders Follow-up Appointments Return Appointment in 2 weeks. Bathing/ Shower/ Hygiene May shower and wash wound with soap and water. - with dressing changes. Edema Control - Lymphedema / SCD / Other Elevate legs to the level of the heart or above for 30 minutes daily and/or when sitting, a frequency of: - 3-4 times a throughout the day. Avoid standing for long periods of time. Off-Loading Wound #1 Right,Dorsal Foot Open toe surgical shoe to: - ensure no pressure to top of foot. Wound Treatment Wound #1 - Foot Wound Laterality: Dorsal, Right Prim Dressing: Santyl Ointment 1 x Per Day/30 Days ary Discharge Instructions: Apply nickel thick amount to wound bed as instructed Secondary Dressing: ComfortFoam Border, 3x3 in (silicone border) (Generic) 1 x Per Day/30 Days Discharge Instructions: Apply over primary dressing as directed. Electronic Signature(s) Signed: 03/28/2020 4:38:50 PM By: Worthy Keeler PA-C Signed: 03/28/2020 5:05:02 PM By: Ruben Gouty RN, BSN Entered By: [name] on 03/28/2020  15:48:07 -------------------------------------------------------------------------------- Problem List Details Patient Name: Date of Service: [name], [name]. 03/28/2020 2:30 PM Medical Record Number: [id number] Patient Account Number: [id number] Date of Birth/Sex: Treating RN: [birth year] (60 y.o. [name], [facility] NNA Other Clinician: Referring Huang: Treating Huang/Extender: Worthy Keeler STO WE, SHA NNA Weeks in Treatment: 52 Active Problems ICD-10 Encounter Code Description Active Date MDM Diagnosis I73.89 Other specified peripheral vascular diseases 08/15/2019 No Yes E11.621 Type 2 diabetes mellitus with foot ulcer 08/15/2019 No Yes L97.512 Non-pressure chronic ulcer of other part of right foot with fat layer exposed 08/15/2019 No Yes I10 Essential (primary) hypertension 08/15/2019 No Yes F17.218 Nicotine dependence, cigarettes, with other nicotine-induced disorders 08/15/2019 No Yes Inactive Problems Resolved Problems Electronic Signature(s) Signed: 03/28/2020 2:45:27 PM By: Worthy Keeler PA-C Entered By: Worthy Keeler on 03/28/2020 14:45:24 -------------------------------------------------------------------------------- Progress Note Details Patient Name: Date of Service: [name], [name]. 03/28/2020 2:30 PM Medical Record Number: [id number] Patient Account Number: [id number] Date of Birth/Sex: Treating RN: 10/04/1960 (60 y.o. [name], [facility] NNA Other Clinician: Referring Huang: Treating Huang/Extender: Worthy Keeler STO WE, SHA NNA Weeks in Treatment: 30 Subjective Chief Complaint Information obtained from Patient Right foot ulcers History of Present Illness (HPI) 08/15/2019 upon evaluation today patient presents for  evaluation here in our clinic concerning issues that he is having with wounds on the dorsal surface of his right foot. This is something that was noted to  occur after he was obtaining cortisone injections with [facility]. Subsequently he developed ulcerations he was then referred to Dr. Alvester Chou who performed arterial testing and subsequently found that the patient had poor arterial flow with an ABI of 0.6 and a TBI of 0.26. With that being said he has since on 07/16/2019 had an angiogram on the right with improvement of his ABI to 0.85 and TBI to 0.5. Obviously this is not perfect but is definitely far superior to where it was previous. He does have some necrotic tissue on the surface of the wounds is going to need debriding away he has been using Silvadene on this but to be honest that is not can to do anything for these wounds. He needs something more to clear this away sharp debridement is ideal and we may even look towards Santyl as well. The patient does have a history of diabetes noted in his chart though is not on medication and his A1c was around 6.4 so is not terrible. With that being said he does have known peripheral vascular disease he is actually have an angiogram on the left tomorrow. He also has hypertension and he does smoke. 8/27; this is a patient has been here once before about 6 weeks ago. Since then he was hospitalized from 7/22 through 7/23 by Dr. Gwenlyn Found for an attempt at revascularization. He had previously been seen by Dr. Amalia Hailey of podiatry. He is a continued tobacco smoker. His angiogram on 6/21 via revealed a occluded SFAs bilaterally with one-vessel runoff. He had a atherectomy followed by a drug-coated balloon angioplasty of the right SFA. 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 He had some improvement in his right ABI from 0.6-0.85. There is also an attempt to open his left SFA but that was unsuccessful. He does not have a wound on the left side. We are using Santyl on the patient's wounds on the right dorsal foot. He says he has about a 1 minute exercise tolerance before stopping because of  pain compatible with claudication 9/10; we are using Santyl on the wound on the right dorsal foot. Still has significant claudication symptoms. He is status post angioplasty of the right SFA and atherectomy. He has one-vessel runoff via a diseased peroneal. He follows up with Dr. Gwenlyn Found on 9/14 9/17; I had my notes from last week that the patient was to see Dr. Gwenlyn Found on 9/14 he tells me that the appointment is actually on 9/21 although I do not see that in epic. The next appointment I see with Dr. Gwenlyn Found is on 11/17. Nevertheless the patient is fairly adamant that his appointment is early next week I told him to call ahead before he goes. The wound is a lot better we have been using Santyl over that he just ran out of that all represcribe it. 10/1; patient arrives today with nothing on his wound. He says he has been using the Santyl but he feels the border foam is too painful. We told him he needs to put a covering on this of some sort perhaps gauze. Indeed after our discussion last time his appointment with Dr. Gwenlyn Found 11/17. Previously he has had a atherectomy and an angioplasty of the right SFA. I note in my previous notes it was said he had  bilateral one-vessel runoff I'll need to review the tibial vessels on the angiogram. The patient is currently rating his pain at 8 out of 10 He followed up with Dr. [facility] Ha[facility] at [facility] on 9/[facility]. He emphasized to follow-up with vascular and [place]. Follow-up with them as needed. 10/15; patient ran out of Santyl and has been using Silvadene. He still has constant pain which I think is largely claudication. I have looked over his angiogram results. I need to communicate with Dr. Gwenlyn Found to see if anything else can be done here. He arrives in clinic today with tendon over most of the wound surface area 10/29; we are using silver collagen on the right foot. Still complaining of a lot of pain especially at night. Dr. Gwenlyn Found is out of town this week I will send him  an email but he has a follow-up appointment on 11/17. 11/18; patient saw Dr. Gwenlyn Found yesterday. He has a history of a directional atherectomy followed by a drug-coated balloon angioplasty on the right SFA. 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 His right ABI increased from 0.6 2.85 however the right foot wound has not budged. He is going next Wednesday for an attempt to do tibial artery interventions. He mentions that he has one-vessel runoff via the peroneal.. They are going to attempt to look at the anterior tibial and posterior tibial revascularization next Wednesday. If they were successful at this then I will consider debridement and changing the topical dressings to this wound. Patient is still in a lot of pain 12/2; the patient underwent revascularization by Dr. Fletcher Anon on 12/19/2019. 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 The posterior tibial artery gives good flow into the whole pedal arch getting collaterals from the peroneal artery the anterior tibial artery was occluded. Unfortunately his right anterior tibial artery is not amenable for revascularization. He had a drug-eluting stent placed in the right common iliac artery into the external iliac artery. The patient arrives saying his pain is about the same. He still has exposed tendon in the base of the wound however there is some healthy looking granulation at the circumference. Perhaps reason for some guarded optimism. Comes in today not wanting debridement asking for pain medication 02/22/2020; patient has not been here in almost 2 months. He has a punched-out area on the right dorsal foot. Completely necrotic surface. We have been using silver collagen when he was last here the beginning of  December he ran out of this a long time ago. I think he has been using Silvadene cream. He follows up with Dr. Fletcher Anon or Dr. Gwenlyn Found next week. He arrives in clinic as usual not wanting debridement unfortunately its debridement is what he needs 2/11; this area on the right foot looks somewhat better than when I saw this 2 weeks ago. I did receive a call from Dr. Gwenlyn Found who went over his PAD history. He had a directional atherectomy followed by drug coated balloon angioplasty of his right SFA on 07/16/2019 his right ABI increased from 0.6-0.85 at that point. He underwent a right external iliac artery stenting by Dr. Fletcher Anon on 12/19/2019 his right SFA was patent as was his peroneal. He did not have a patent posterior tibial at the level of the  ankle the only patent vessel to his foot is the anterior tibial artery although that is fortunate in that that is where the or the major wound is. He was not felt to be a candidate for any further angiographic intervention 03/28/2020 upon evaluation today patient appears to be doing well with regard to his wound. Is been tolerating the dressing changes without complication. With that being said he has been using Santyl this definitely seems to be doing better than nothing is for breakdown some of the slough and biofilm on the surface of the wound. Fortunately there is no evidence of active infection which is great news. No fevers, chills, nausea, vomiting, or diarrhea. Objective Constitutional Well-nourished and well-hydrated in no acute distress. Vitals Time Taken: 2:57 PM, Height: 62 in, Weight: 130 lbs, BMI: 23.8, Temperature: 98.7 F, Pulse: 102 bpm, Respiratory Rate: 16 breaths/min, Blood Pressure: 113/70 mmHg. Respiratory normal breathing without difficulty. Psychiatric this patient is able to make decisions and demonstrates good insight into disease process. Alert and Oriented x 3. pleasant and cooperative. General Notes: Patient's wound bed I again did have a  little bit of slough and biofilm that really would have benefited him to be able to debride this away even mechanically with saline and gauze. With that being said he told me know and would not allow me to do this. For that reason honestly would go ahead just continue with the Santyl I think is in a go much slower but there is really nothing more that I can do in that regard. Integumentary (Hair, Skin) Wound #1 status is Open. Original cause of wound was Gradually Appeared. The date acquired was: 03/26/2019. The wound has been in treatment 32 weeks. The wound is located on the Right,Dorsal Foot. The wound measures 1.4cm length x 1.1cm width x 0.3cm depth; 1.21cm^2 area and 0.363cm^3 volume. There is tendon and Fat Layer (Subcutaneous Tissue) exposed. There is no tunneling or undermining noted. There is a medium amount of serosanguineous drainage noted. The wound margin is well defined and not attached to the wound base. There is small (1-33%) granulation within the wound bed. There is a large (67- 100%) amount of necrotic tissue within the wound bed including Adherent Slough. Assessment Active Problems ICD-10 Other specified peripheral vascular diseases Type 2 diabetes mellitus with foot ulcer Non-pressure chronic ulcer of other part of right foot with fat layer exposed Essential (primary) hypertension Nicotine dependence, cigarettes, with other nicotine-induced disorders Procedures Wound #1 Pre-procedure diagnosis of Wound #1 is a Diabetic Wound/Ulcer of the Lower Extremity located on the Right,Dorsal Foot .Severity of Tissue Pre Debridement is: Fat layer exposed. There was a Chemical/Enzymatic/Mechanical debridement performed by Deon Pilling, RN.Marland Kitchen Agent used was [employer]. There was no bleeding. The procedure was tolerated well. Post Debridement Measurements: 1.4cm length x 1.1cm width x 0.3cm depth; 0.363cm^3 volume. Character of Wound/Ulcer Post Debridement requires further debridement.  Severity of Tissue Post Debridement is: Fat layer exposed. Post procedure Diagnosis Wound #1: Same as Pre-Procedure Plan Follow-up Appointments: Return Appointment in 2 weeks. Bathing/ Shower/ Hygiene: May shower and wash wound with soap and water. - with dressing changes. Edema Control - Lymphedema / SCD / Other: Elevate legs to the level of the heart or above for 30 minutes daily and/or when sitting, a frequency of: - 3-4 times a throughout the day. Avoid standing for long periods of time. Off-Loading: Wound #1 Right,Dorsal Foot: Open toe surgical shoe to: - ensure no pressure to top of foot. WOUND #1: - Foot  Wound Laterality: Dorsal, Right Prim Dressing: Santyl Ointment 1 x Per Day/30 Days ary Discharge Instructions: Apply nickel thick amount to wound bed as instructed Secondary Dressing: ComfortFoam Border, 3x3 in (silicone border) (Generic) 1 x Per Day/30 Days Discharge Instructions: Apply over primary dressing as directed. 1. Would recommend currently that we go ahead and continue with the wound care measures as before with the Santyl I think that still the right thing. 2. I am also can recommend patient continue to monitor for any signs of infection or worsening in general. If anything occurs he should let me know soon as possible. We will see patient back for reevaluation in 2 weeks here in the clinic. If anything worsens or changes patient will contact our office for additional recommendations. Electronic Signature(s) Signed: 03/28/2020 4:38:50 PM By: Worthy Keeler PA-C Signed: 03/28/2020 5:05:02 PM By: Ruben Gouty RN, BSN Previous Signature: 03/28/2020 3:47:08 PM Version By: Worthy Keeler PA-C Entered By: [name] on 03/28/2020 15:48:20 -------------------------------------------------------------------------------- SuperBill Details Patient Name: Date of Service: [name], [name] 03/28/2020 Medical Record Number: [id number] Patient Account Number:  [id number] Date of Birth/Sex: Treating RN: 1960/03/21 (60 y.o. [name], [facility] NNA Other Clinician: Referring Huang: Treating Huang/Extender: Worthy Keeler STO WE, SHA NNA Weeks in Treatment: 32 Diagnosis Coding ICD-10 Codes Code Description I73.89 Other specified peripheral vascular diseases E11.621 Type 2 diabetes mellitus with foot ulcer L97.512 Non-pressure chronic ulcer of other part of right foot with fat layer exposed I10 Essential (primary) hypertension F17.218 Nicotine dependence, cigarettes, with other nicotine-induced disorders Physician Procedures : CPT4 Code Description Modifier [id number] - WC PHYS LEVEL 3 - EST PT ICD-10 Diagnosis Description I73.89 Other specified peripheral vascular diseases E11.621 Type 2 diabetes mellitus with foot ulcer L97.512 Non-pressure chronic ulcer of other part  of right foot with fat layer exposed I10 Essential (primary) hypertension Quantity: 1 Electronic Signature(s) Signed: 03/28/2020 3:47:19 PM By: Worthy Keeler PA-C Entered By: Worthy Keeler on 03/28/2020 15:47:18

## 2020-04-01 NOTE — Progress Notes (Signed)
MANDELA, BELLO ([id number]) Visit Report for 03/28/2020 Arrival Information Details Patient Name: Date of Service: [place] 03/28/2020 2:30 PM Medical Record Number: [id number] Patient Account Number: [id number] Date of Birth/Sex: Treating RN: 26-Feb-1960 (60 y.o. [name] Primary Care Nikeshia Keetch: Kennon Rounds, [facility] NNA Other Clinician: Referring Lekita Kerekes: Treating Mellonie Guess/Extender: Worthy Keeler STO WE, SHA NNA Weeks in Treatment: 104 Visit Information History Since Last Visit Added or deleted any medications: No Patient Arrived: Ambulatory Any new allergies or adverse reactions: No Arrival Time: 14:57 Had a fall or experienced change in No Accompanied By: self activities of daily living that may affect Transfer Assistance: None risk of falls: Patient Identification Verified: Yes Signs or symptoms of abuse/neglect since last visito No Secondary Verification Process Completed: Yes Hospitalized since last visit: No Patient Requires Transmission-Based Precautions: No Implantable device outside of the clinic excluding No Patient Has Alerts: Yes cellular tissue based products placed in the center Patient Alerts: Patient on Blood Thinner since last visit: R ABI: 0.85 R TBI: 0.50 Has Dressing in Place as Prescribed: Yes Pain Present Now: Yes Electronic Signature(s) Signed: 04/01/2020 11:11:03 AM By: Sandre Kitty Entered By: Sandre Kitty on 03/28/2020 14:57:25 -------------------------------------------------------------------------------- Encounter Discharge Information Details Patient Name: Date of Service: [name], [name] 03/28/2020 2:30 PM Medical Record Number: [id number] Patient Account Number: [id number] Date of Birth/Sex: Treating RN: 18-Nov-1960 (60 y.o. [name] Primary Care Skilar Marcou: Kennon Rounds, [facility] NNA Other Clinician: Referring Quyen Cutsforth: Treating Scherry Laverne/Extender: Worthy Keeler STO WE, SHA NNA Weeks in Treatment: 70 Encounter  Discharge Information Items Post Procedure Vitals Discharge Condition: Stable Temperature (F): 98.7 Ambulatory Status: Ambulatory Pulse (bpm): 102 Discharge Destination: Home Respiratory Rate (breaths/min): 18 Transportation: Private Auto Blood Pressure (mmHg): 113/70 Accompanied By: self Schedule Follow-up Appointment: Yes Clinical Summary of Care: Patient Declined Electronic Signature(s) Signed: 03/28/2020 5:05:02 PM By: Baruch Gouty RN, BSN Entered By: Baruch Gouty on 03/28/2020 15:52:01 -------------------------------------------------------------------------------- Lower Extremity Assessment Details Patient Name: Date of Service: [name] 03/28/2020 2:30 PM Medical Record Number: [id number] Patient Account Number: [id number] Date of Birth/Sex: Treating RN: January 03, 1961 (60 y.o. [name] Primary Care Vollie Aaron: Kennon Rounds, [facility] NNA Other Clinician: Referring Commodore Bellew: Treating Catalea Labrecque/Extender: Worthy Keeler STO WE, SHA NNA Weeks in Treatment: 32 Edema Assessment Assessed: [Left: No] [Right: No] Edema: [Left: N] [Right: o] Calf Left: Right: Point of Measurement: 28 cm From Medial Instep 27 cm Ankle Left: Right: Point of Measurement: 9 cm From Medial Instep 18 cm Vascular Assessment Pulses: Dorsalis Pedis Palpable: [Right:Yes] Electronic Signature(s) Signed: 03/28/2020 3:56:44 PM By: Lorrin Jackson Signed: 03/28/2020 5:05:02 PM By: Baruch Gouty RN, BSN Entered By: Lorrin Jackson on 03/28/2020 15:05:27 -------------------------------------------------------------------------------- Multi-Disciplinary Care Plan Details Patient Name: Date of Service: [name], [name] 03/28/2020 2:30 PM Medical Record Number: [id number] Patient Account Number: [id number] Date of Birth/Sex: Treating RN: 10-Feb-1960 (60 y.o. [name] Primary Care Branton Einstein: Kennon Rounds, [facility] NNA Other Clinician: Referring Berry Godsey: Treating Darletta Noblett/Extender: Finis Bud WE, SHA NNA Weeks in Treatment: [place] reviewed with physician Active Inactive Nutrition Nursing Diagnoses: Impaired glucose control: actual or potential Potential for alteratiion in Nutrition/Potential for imbalanced nutrition Goals: Patient/caregiver will maintain therapeutic glucose control Date Initiated: 08/15/2019 Target Resolution Date: 04/18/2020 Goal Status: Active Interventions: Assess HgA1c results as ordered upon admission and as needed Assess patient nutrition upon admission and as needed per policy Treatment Activities: Patient referred to Primary Care Physician for further nutritional evaluation :  08/15/2019 Notes: Wound/Skin Impairment Nursing Diagnoses: Impaired tissue integrity Knowledge deficit related to smoking impact on wound healing Knowledge deficit related to ulceration/compromised skin integrity Goals: Patient will demonstrate a reduced rate of smoking or cessation of smoking Date Initiated: 08/15/2019 Target Resolution Date: 04/18/2020 Goal Status: Active Patient/caregiver will verbalize understanding of skin care regimen Date Initiated: 08/15/2019 Target Resolution Date: 04/18/2020 Goal Status: Active Ulcer/skin breakdown will have a volume reduction of 30% by week 4 Date Initiated: 08/15/2019 Date Inactivated: 09/21/2019 Target Resolution Date: 09/12/2019 Goal Status: Met Interventions: Assess patient/caregiver ability to obtain necessary supplies Assess patient/caregiver ability to perform ulcer/skin care regimen upon admission and as needed Assess ulceration(s) every visit Provide education on ulcer and skin care Treatment Activities: Skin care regimen initiated : 08/15/2019 Topical wound management initiated : 08/15/2019 Notes: Electronic Signature(s) Signed: 03/28/2020 5:05:02 PM By: Baruch Gouty RN, BSN Entered By: Baruch Gouty on 03/28/2020  15:44:56 -------------------------------------------------------------------------------- Pain Assessment Details Patient Name: Date of Service: [name] 03/28/2020 2:30 PM Medical Record Number: [id number] Patient Account Number: [id number] Date of Birth/Sex: Treating RN: December 12, 1960 (61 y.o. [name] Primary Care Nyree Yonker: Kennon Rounds, [facility] NNA Other Clinician: Referring Lenise Jr: Treating Daine Croker/Extender: Worthy Keeler STO WE, SHA NNA Weeks in Treatment: 55 Active Problems Location of Pain Severity and Description of Pain Patient Has Paino No Site Locations Pain Management and Medication Current Pain Management: Electronic Signature(s) Signed: 03/28/2020 5:05:02 PM By: Baruch Gouty RN, BSN Signed: 04/01/2020 11:11:03 AM By: Sandre Kitty Entered By: Sandre Kitty on 03/28/2020 14:58:17 -------------------------------------------------------------------------------- Patient/Caregiver Education Details Patient Name: Date of Service: [name] 3/4/2022andnbsp2:30 PM Medical Record Number: [id number] Patient Account Number: [id number] Date of Birth/Gender: Treating RN: 10/18/1960 (60 y.o. [name] Primary Care Physician: Kennon Rounds, [facility] NNA Other Clinician: Referring Physician: Treating Physician/Extender: Finis Bud WE, SHA NNA Weeks in Treatment: 29 Education Assessment Education Provided To: Patient Education Topics Provided Wound/Skin Impairment: Methods: Explain/Verbal Responses: Reinforcements needed, State content correctly [employer]) Signed: 03/28/2020 5:05:02 PM By: Baruch Gouty RN, BSN Entered By: Baruch Gouty on 03/28/2020 15:45:19 -------------------------------------------------------------------------------- Wound Assessment Details Patient Name: Date of Service: [name] 03/28/2020 2:30 PM Medical Record Number: [id number] Patient Account Number: [id number] Date of  Birth/Sex: Treating RN: [birth year] (60 y.o. [name] Primary Care Rayanna Matusik: Kennon Rounds, [facility] NNA Other Clinician: Referring Bell Cai: Treating Alexsandro Salek/Extender: Worthy Keeler STO WE, SHA NNA Weeks in Treatment: 32 Wound Status Wound Number: 1 Primary Diabetic Wound/Ulcer of the Lower Extremity Etiology: Wound Location: Right, Dorsal Foot Secondary Arterial Insufficiency Ulcer Wounding Event: Gradually Appeared Etiology: Date Acquired: 03/26/2019 Wound Status: Open Weeks Of Treatment: 32 Comorbid Hypertension, Peripheral Arterial Disease, Type II Diabetes, Clustered Wound: No History: Neuropathy Photos Wound Measurements Length: (cm) 1.4 Width: (cm) 1.1 Depth: (cm) 0.3 Area: (cm) 1.21 Volume: (cm) 0.363 % Reduction in Area: 57.2% % Reduction in Volume: -28.3% Epithelialization: Small (1-33%) Tunneling: No Undermining: No Wound Description Classification: Grade 2 Wound Margin: Well defined, not attached Exudate Amount: Medium Exudate Type: Serosanguineous Exudate Color: red, brown Foul Odor After Cleansing: No Slough/Fibrino Yes Wound Bed Granulation Amount: Small (1-33%) Exposed Structure Necrotic Amount: Large (67-100%) Fascia Exposed: No Necrotic Quality: Adherent Slough Fat Layer (Subcutaneous Tissue) Exposed: Yes Tendon Exposed: Yes Muscle Exposed: No Joint Exposed: No Bone Exposed: No Treatment Notes Wound #1 (Foot) Wound Laterality: Dorsal, Right Cleanser Peri-Wound Care Topical Primary Dressing Santyl Ointment Discharge Instruction: Apply nickel thick amount to wound bed as instructed  Secondary Dressing ComfortFoam Border, 3x3 in (silicone border) Discharge Instruction: Apply over primary dressing as directed. Secured With Compression Wrap Compression Stockings Add-Ons Electronic Signature(s) Signed: 04/01/2020 11:11:03 AM By: Sandre Kitty Signed: 04/01/2020 6:01:57 PM By: Baruch Gouty RN, BSN Previous Signature: 03/28/2020 3:56:44  PM Version By: Lorrin Jackson Previous Signature: 03/28/2020 5:05:02 PM Version By: Baruch Gouty RN, BSN Entered By: Sandre Kitty on 04/01/2020 11:04:41 -------------------------------------------------------------------------------- Vitals Details Patient Name: Date of Service: [name], [name] 03/28/2020 2:30 PM Medical Record Number: [id number] Patient Account Number: [id number] Date of Birth/Sex: Treating RN: 1960/10/20 (60 y.o. [name] Primary Care Angelita Harnack: Kennon Rounds, [facility] NNA Other Clinician: Referring Chlora Mcbain: Treating Karene Bracken/Extender: Worthy Keeler STO WE, SHA NNA Weeks in Treatment: 32 Vital Signs Time Taken: 14:57 Temperature (F): 98.7 Height (in): 62 Pulse (bpm): 102 Weight (lbs): 130 Respiratory Rate (breaths/min): 16 Body Mass Index (BMI): 23.8 Blood Pressure (mmHg): 113/70 Reference Range: 80 - 120 mg / dl Electronic Signature(s) Signed: 04/01/2020 11:11:03 AM By: Sandre Kitty Entered By: Sandre Kitty on 03/28/2020 14:58:07

## 2020-04-11 ENCOUNTER — Encounter (HOSPITAL_BASED_OUTPATIENT_CLINIC_OR_DEPARTMENT_OTHER): Payer: Medicare Other | Admitting: Internal Medicine

## 2020-04-18 ENCOUNTER — Encounter (HOSPITAL_BASED_OUTPATIENT_CLINIC_OR_DEPARTMENT_OTHER): Payer: Medicare Other | Admitting: Internal Medicine

## 2020-04-18 ENCOUNTER — Other Ambulatory Visit: Payer: Self-pay

## 2020-04-18 DIAGNOSIS — F17218 Nicotine dependence, cigarettes, with other nicotine-induced disorders: Secondary | ICD-10-CM | POA: Diagnosis not present

## 2020-04-18 DIAGNOSIS — I10 Essential (primary) hypertension: Secondary | ICD-10-CM | POA: Diagnosis not present

## 2020-04-18 DIAGNOSIS — L97519 Non-pressure chronic ulcer of other part of right foot with unspecified severity: Secondary | ICD-10-CM | POA: Diagnosis present

## 2020-04-18 DIAGNOSIS — L97512 Non-pressure chronic ulcer of other part of right foot with fat layer exposed: Secondary | ICD-10-CM | POA: Diagnosis not present

## 2020-04-18 DIAGNOSIS — E11621 Type 2 diabetes mellitus with foot ulcer: Secondary | ICD-10-CM | POA: Diagnosis not present

## 2020-04-18 DIAGNOSIS — E1151 Type 2 diabetes mellitus with diabetic peripheral angiopathy without gangrene: Secondary | ICD-10-CM | POA: Diagnosis not present

## 2020-04-21 NOTE — Progress Notes (Signed)
JIA, MALVIN ([id number]) Visit Report for 04/18/2020 Debridement Details Patient Name: Date of Service: [name] 04/18/2020 1:45 PM Medical Record Number: [id number] Patient Account Number: [id number] Date of Birth/Sex: Treating RN: July 31, 1960 (60 y.o. [name] Primary Care Provider: Kennon Rounds, [facility] NNA Other Clinician: Referring Provider: Treating Provider/Extender: Dortha Schwalbe, SHA NNA Weeks in Treatment: 35 Debridement Performed for Assessment: Wound #1 Right,Dorsal Foot Performed By: Physician Ricard Dillon., MD Debridement Type: Debridement Severity of Tissue Pre Debridement: Fat layer exposed Level of Consciousness (Pre-procedure): Awake and Alert Pre-procedure Verification/Time Out Yes - 14:10 Taken: Start Time: 14:11 Pain Control: Other : T Area Debrided (L x W): otal 1 (cm) x 1.3 (cm) = 1.3 (cm) Tissue and other material debrided: Non-Viable, Callus, Eschar, Subcutaneous Level: Skin/Subcutaneous Tissue Debridement Description: Excisional Instrument: Curette Bleeding: Minimum Hemostasis Achieved: Pressure End Time: 14:14 Response to Treatment: Procedure was tolerated well Level of Consciousness (Post- Awake and Alert procedure): Post Debridement Measurements of Total Wound Length: (cm) 1 Width: (cm) 1.3 Depth: (cm) 0.5 Volume: (cm) 0.511 Character of Wound/Ulcer Post Debridement: Stable Severity of Tissue Post Debridement: Fat layer exposed Post Procedure Diagnosis Same as Pre-procedure Electronic Signature(s) Signed: 04/18/2020 6:16:52 PM By: Lorrin Jackson Signed: 04/21/2020 9:50:14 AM By: Linton Ham MD Entered By: Linton Ham on 04/18/2020 14:28:26 -------------------------------------------------------------------------------- HPI Details Patient Name: Date of Service: [name], [name]. 04/18/2020 1:45 PM Medical Record Number: [id number] Patient Account Number: [id number] Date of Birth/Sex: Treating  RN: 1960-09-01 (60 y.o. [name] Primary Care Provider: Kennon Rounds, [facility] NNA Other Clinician: Referring Provider: Treating Provider/Extender: Glennon Mac WE, SHA NNA Weeks in Treatment: 35 History of Present Illness HPI Description: 08/15/2019 upon evaluation today patient presents for evaluation here in our clinic concerning issues that he is having with wounds on the dorsal surface of his right foot. This is something that was noted to occur after he was obtaining cortisone injections with [facility]. Subsequently he developed ulcerations he was then referred to Dr. Alvester Chou who performed arterial testing and subsequently found that the patient had poor arterial flow with an ABI of 0.6 and a TBI of 0.26. With that being said he has since on 07/16/2019 had an angiogram on the right with improvement of his ABI to 0.85 and TBI to 0.5. Obviously this is not perfect but is definitely far superior to where it was previous. He does have some necrotic tissue on the surface of the wounds is going to need debriding away he has been using Silvadene on this but to be honest that is not can to do anything for these wounds. He needs something more to clear this away sharp debridement is ideal and we may even look towards Santyl as well. The patient does have a history of diabetes noted in his chart though is not on medication and his A1c was around 6.4 so is not terrible. With that being said he does have known peripheral vascular disease he is actually have an angiogram on the left tomorrow. He also has hypertension and he does smoke. 8/27; this is a patient has been here once before about 6 weeks ago. Since then he was hospitalized from 7/22 through 7/23 by Dr. Gwenlyn Found for an attempt at revascularization. He had previously been seen by Dr. Amalia Hailey of podiatry. He is a continued tobacco smoker. His angiogram on 6/21 via revealed a occluded SFAs bilaterally with one-vessel runoff. He had a  atherectomy followed by a  drug-coated balloon angioplasty of the right SFA. 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 He had some improvement in his right ABI from 0.6-0.85. There is also an attempt to open his left SFA but that was unsuccessful. He does not have a wound on the left side. We are using Santyl on the patient's wounds on the right dorsal foot. He says he has about a 1 minute exercise tolerance before stopping because of pain compatible with claudication 9/10; we are using Santyl on the wound on the right dorsal foot. Still has significant claudication symptoms. He is status post angioplasty of the right SFA and atherectomy. He has one-vessel runoff via a diseased peroneal. He follows up with Dr. Gwenlyn Found on 9/14 9/17; I had my notes from last week that the patient was to see Dr. Gwenlyn Found on 9/14 he tells me that the appointment is actually on 9/21 although I do not see that in epic. The next appointment I see with Dr. Gwenlyn Found is on 11/17. Nevertheless the patient is fairly adamant that his appointment is early next week I told him to call ahead before he goes. The wound is a lot better we have been using Santyl over that he just ran out of that all represcribe it. 10/1; patient arrives today with nothing on his wound. He says he has been using the Santyl but he feels the border foam is too painful. We told him he needs to put a covering on this of some sort perhaps gauze. Indeed after our discussion last time his appointment with Dr. Gwenlyn Found 11/17. Previously he has had a atherectomy and an angioplasty of the right SFA. I note in my previous notes it was said he had bilateral one-vessel runoff I'll need to review the tibial vessels on the angiogram. The patient is currently rating his pain at 8 out of 10 He followed up with Dr. [facility] Hailey at [facility] on 9/27. He emphasized to follow-up with vascular and [place]. Follow-up with them as needed. 10/15; patient ran out  of Santyl and has been using Silvadene. He still has constant pain which I think is largely claudication. I have looked over his angiogram results. I need to communicate with Dr. Gwenlyn Found to see if anything else can be done here. He arrives in clinic today with tendon over most of the wound surface area 10/29; we are using silver collagen on the right foot. Still complaining of a lot of pain especially at night. Dr. Gwenlyn Found is out of town this week I will send him an email but he has a follow-up appointment on 11/17. 11/18; patient saw Dr. Gwenlyn Found yesterday. He has a history of a directional atherectomy followed by a drug-coated balloon angioplasty on the right SFA. 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 His right ABI increased from 0.6 2.85 however the right foot wound has not budged. He is going next Wednesday for an attempt to do tibial artery interventions. He mentions that he has one-vessel runoff via the peroneal.. They are going to attempt to look at the anterior tibial and posterior tibial revascularization next Wednesday. If they were successful at this then I will consider debridement and changing the topical dressings to this wound. Patient is still in a lot of pain 12/2; the patient underwent revascularization by Dr. Fletcher Anon on 12/19/2019. 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 The posterior tibial artery gives good flow into the whole pedal arch getting collaterals from the peroneal artery the anterior tibial artery was occluded. Unfortunately his right anterior tibial artery is not amenable for revascularization. He had a drug-eluting stent placed in the right common iliac artery into the external iliac artery. The patient arrives saying his pain is  about the same. He still has exposed tendon in the base of the wound however there is some healthy looking granulation at the circumference. Perhaps reason for some guarded optimism. Comes in today not wanting debridement asking for pain medication 02/22/2020; patient has not been here in almost 2 months. He has a punched-out area on the right dorsal foot. Completely necrotic surface. We have been using silver collagen when he was last here the beginning of December he ran out of this a long time ago. I think he has been using Silvadene cream. He follows up with Dr. Fletcher Anon or Dr. Gwenlyn Found next week. He arrives in clinic as usual not wanting debridement unfortunately its debridement is what he needs 2/11; this area on the right foot looks somewhat better than when I saw this 2 weeks ago. I did receive a call from Dr. Gwenlyn Found who went over his PAD history. He had a directional atherectomy followed by drug coated balloon angioplasty of his right SFA on 07/16/2019 his right ABI increased from 0.6-0.85 at that point. He underwent a right external iliac artery stenting by Dr. Fletcher Anon on 12/19/2019 his right SFA was patent as was his peroneal. He did not have a patent posterior tibial at the level of the ankle the only patent vessel to his foot is the anterior tibial artery although that is fortunate in that that is where the or the major wound is. He was not felt to be a candidate for any further angiographic intervention 03/28/2020 upon evaluation today patient appears to be doing well with regard to his wound. Is been tolerating the dressing changes without complication. With that being said he has been using Santyl this definitely seems to be doing better than nothing is for breakdown some of the slough and biofilm on the surface of the wound. Fortunately there is no evidence of active infection which is great news. No fevers, chills, nausea, vomiting, or diarrhea. 3/25; since the patient was last here I was called by  Dr. Gwenlyn Found. They do not anticipate any more interventions at this point. I have listed his impressions on the right leg vasculature below " Mr. Mcneish returns today for follow-up. He follows with Dr. Dellia Nims at the wound care clinic for a slowly healing wound on the dorsal aspect of his right foot. He has had 2 interventions on this, one in July 2021 where I revascularized an occluded right SFA. He had one-vessel runoff. He did have spasm of his peroneal which ultimately was shown to have resolved repeat CT angiography. In November he had stenting of a calcified physiologically significant right external iliac artery. His anterior tibial artery does not reach past the ankle and therefore revascularization would be difficult. I would continue with aggressive local wound care. He also has claudication on the left. I attempted left SFA intervention in July but was unsuccessful in reentering the distal lumen. Unfortunately, he only has 1 patent tibial vessel on the left common anterior tibial, and no evidence of critical limb  ischemia so I am hesitant to recommend tibial pedal access for a retrograde approach." He has been using Santyl on the right dorsal foot. Arrives with the wound above 0.5 cm in depth the surface does not look too bad. He is also complaining about pain in the left fifth toe without any antecedent trauma he is aware of. Says that the pain has been there for the last 5 or 6 weeks Electronic Signature(s) Signed: 04/21/2020 9:50:14 AM By: Linton Ham MD Entered By: Linton Ham on 04/18/2020 14:32:38 -------------------------------------------------------------------------------- Physical Exam Details Patient Name: Date of Service: [name]. 04/18/2020 1:45 PM Medical Record Number: [id number] Patient Account Number: [id number] Date of Birth/Sex: Treating RN: Jun 25, 1960 (60 y.o. [name] Primary Care Provider: Kennon Rounds, [facility] NNA Other Clinician: Referring  Provider: Treating Provider/Extender: Glennon Mac WE, SHA NNA Weeks in Treatment: 35 Constitutional Sitting or standing Blood Pressure is within target range for patient.. Pulse regular and within target range for patient.Marland Kitchen Respirations regular, non-labored and within target range.. Temperature is normal and within the target range for the patient.Marland Kitchen Appears in no distress. Musculoskeletal The left fifth toe is somewhat inflamed and tender over the DIP. May be a small effusion in this area but there is no open wound. Notes Wound exam; using a #3 curette I debrided the wound on the right dorsal foot surrounding eschar thick skin and debris on the surface hemostasis with direct pressure. This does bleed. No evidence of surrounding infection Electronic Signature(s) Signed: 04/21/2020 9:50:14 AM By: Linton Ham MD Entered By: Linton Ham on 04/18/2020 14:33:55 -------------------------------------------------------------------------------- Physician Orders Details Patient Name: Date of Service: [name], [name]. 04/18/2020 1:45 PM Medical Record Number: [id number] Patient Account Number: [id number] Date of Birth/Sex: Treating RN: 1960/07/01 (60 y.o. [name] Primary Care Provider: Kennon Rounds, [facility] NNA Other Clinician: Referring Provider: Treating Provider/Extender: Glennon Mac WE, SHA NNA Weeks in Treatment: 39 Verbal / Phone Orders: No Diagnosis Coding ICD-10 Coding Code Description I73.89 Other specified peripheral vascular diseases E11.621 Type 2 diabetes mellitus with foot ulcer L97.512 Non-pressure chronic ulcer of other part of right foot with fat layer exposed I10 Essential (primary) hypertension F17.218 Nicotine dependence, cigarettes, with other nicotine-induced disorders Follow-up Appointments Return Appointment in 2 weeks. Bathing/ Shower/ Hygiene May shower and wash wound with soap and water. - with dressing changes. Edema Control -  Lymphedema / SCD / Other Elevate legs to the level of the heart or above for 30 minutes daily and/or when sitting, a frequency of: - 3-4 times a throughout the day. Avoid standing for long periods of time. Off-Loading Wound #1 Right,Dorsal Foot Open toe surgical shoe to: - ensure no pressure to top of foot. Wound Treatment Wound #1 - Foot Wound Laterality: Dorsal, Right Cleanser: Soap and Water Every Other Day/30 Days Discharge Instructions: May shower and wash wound with dial antibacterial soap and water prior to dressing change. Prim Dressing: Santyl Ointment ary Every Other Day/30 Days Discharge Instructions: Apply nickel thick amount to wound bed as instructed Secondary Dressing: ComfortFoam Border, 4x4 in (silicone border) (DME) (Generic) Every Other Day/30 Days Discharge Instructions: Apply over primary dressing as directed. Radiology X-ray, foot-Left 5th toe - CPT [id number] - (ICD10 E11.621 - Type 2 diabetes mellitus with foot ulcer) : Electronic Signature(s) Signed: 04/18/2020 6:16:52 PM By: Lorrin Jackson Signed: 04/21/2020 9:50:14 AM By: Linton Ham MD Previous Signature: 04/18/2020 1:52:09 PM Version By: Lorrin Jackson Entered By: Lorrin Jackson on 04/18/2020 14:25:54 Prescription 04/18/2020 --------------------------------------------------------------------------------  Margaree Mackintosh MD Patient Name: Provider: May 07, 1960 [id number] Date of Birth: NPI#: Jerilynn Mages [id number] Sex: DEA #: [phone] [id number] Phone #: License #: [place] Patient Address: [address] [place], [place] [address] [place], [place] [address] [phone] Allergies No Known Drug Allergies Provider's Orders X-ray, foot-Left 5th toe - ICD10: E11.621 - CPT [id number] : Hand Signature: Date(s): Electronic Signature(s) Signed: 04/18/2020 6:16:52 PM By: Lorrin Jackson Signed: 04/21/2020 9:50:14 AM By: Linton Ham MD Entered By: Lorrin Jackson on 04/18/2020 14:25:55 -------------------------------------------------------------------------------- Problem List Details Patient Name: Date of Service: [name]. 04/18/2020 1:45 PM Medical Record Number: [id number] Patient Account Number: [id number] Date of Birth/Sex: Treating RN: Aug 12, 1960 (60 y.o. [name] Primary Care Provider: Kennon Rounds, [facility] NNA Other Clinician: Referring Provider: Treating Provider/Extender: Glennon Mac WE, SHA NNA Weeks in Treatment: 12 Active Problems ICD-10 Encounter Encounter Code Description Active Date MDM Diagnosis I73.89 Other specified peripheral vascular diseases 08/15/2019 No Yes E11.621 Type 2 diabetes mellitus with foot ulcer 08/15/2019 No Yes L97.512 Non-pressure chronic ulcer of other part of right foot with fat layer exposed 08/15/2019 No Yes F17.218 Nicotine dependence, cigarettes, with other nicotine-induced disorders 08/15/2019 No Yes M79.672 Pain in left foot 04/18/2020 No Yes Inactive Problems ICD-10 Code Description Active Date Inactive Date I10 Essential (primary) hypertension 08/15/2019 08/15/2019 Resolved Problems Electronic Signature(s) Signed: 04/21/2020 9:50:14 AM By: Linton Ham MD Previous Signature: 04/18/2020 1:51:39 PM Version By: Lorrin Jackson Entered By: Linton Ham on 04/18/2020 14:27:58 -------------------------------------------------------------------------------- Progress Note Details Patient Name: Date of Service: [name]. 04/18/2020 1:45 PM Medical Record Number: [id number] Patient Account Number: [id number] Date of Birth/Sex: Treating RN: [birth year] (60 y.o. [name] Primary Care Provider: Kennon Rounds, [facility] NNA Other Clinician: Referring Provider: Treating Provider/Extender: Glennon Mac WE, SHA NNA Weeks in Treatment: 35 Subjective History of Present Illness (HPI) 08/15/2019 upon evaluation today patient presents for  evaluation here in our clinic concerning issues that he is having with wounds on the dorsal surface of his right foot. This is something that was noted to occur after he was obtaining cortisone injections with [facility]. Subsequently he developed ulcerations he was then referred to Dr. Alvester Chou who performed arterial testing and subsequently found that the patient had poor arterial flow with an ABI of 0.6 and a TBI of 0.26. With that being said he has since on 07/16/2019 had an angiogram on the right with improvement of his ABI to 0.85 and TBI to 0.5. Obviously this is not perfect but is definitely far superior to where it was previous. He does have some necrotic tissue on the surface of the wounds is going to need debriding away he has been using Silvadene on this but to be honest that is not can to do anything for these wounds. He needs something more to clear this away sharp debridement is ideal and we may even look towards Santyl as well. The patient does have a history of diabetes noted in his chart though is not on medication and his A1c was around 6.4 so is not terrible. With that being said he does have known peripheral vascular disease he is actually have an angiogram on the left tomorrow. He also has hypertension and he does smoke. 8/27; this is a patient has been here once before about 6 weeks ago. Since then he was hospitalized from 7/22 through 7/23 by Dr. Gwenlyn Found  for an attempt at revascularization. He had previously been seen by Dr. Amalia Hailey of podiatry. He is a continued tobacco smoker. His angiogram on 6/21 via revealed a occluded SFAs bilaterally with one-vessel runoff. He had a atherectomy followed by a drug-coated balloon angioplasty of the right SFA. 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 He had some improvement in his right ABI from 0.6-0.85. There is also an attempt to open his left SFA but that was unsuccessful. He does not have a wound on the left  side. We are using Santyl on the patient's wounds on the right dorsal foot. He says he has about a 1 minute exercise tolerance before stopping because of pain compatible with claudication 9/10; we are using Santyl on the wound on the right dorsal foot. Still has significant claudication symptoms. He is status post angioplasty of the right SFA and atherectomy. He has one-vessel runoff via a diseased peroneal. He follows up with Dr. Gwenlyn Found on 9/14 9/17; I had my notes from last week that the patient was to see Dr. Gwenlyn Found on 9/14 he tells me that the appointment is actually on 9/21 although I do not see that in epic. The next appointment I see with Dr. Gwenlyn Found is on 11/17. Nevertheless the patient is fairly adamant that his appointment is early next week I told him to call ahead before he goes. The wound is a lot better we have been using Santyl over that he just ran out of that all represcribe it. 10/1; patient arrives today with nothing on his wound. He says he has been using the Santyl but he feels the border foam is too painful. We told him he needs to put a covering on this of some sort perhaps gauze. Indeed after our discussion last time his appointment with Dr. Gwenlyn Found 11/17. Previously he has had a atherectomy and an angioplasty of the right SFA. I note in my previous notes it was said he had bilateral one-vessel runoff I'll need to review the tibial vessels on the angiogram. The patient is currently rating his pain at 8 out of 10 He followed up with Dr. [facility] at [facility] on [facility]. He emphasized to follow-up with vascular and [place]. Follow-up with them as needed. 10/15; patient ran out of Santyl and has been using Silvadene. He still has constant pain which I think is largely claudication. I have looked over his angiogram results. I need to communicate with Dr. Gwenlyn Found to see if anything else can be done here. He arrives in clinic today with tendon over most of the wound surface area 10/29; we  are using silver collagen on the right foot. Still complaining of a lot of pain especially at night. Dr. Gwenlyn Found is out of town this week I will send him an email but he has a follow-up appointment on 11/17. 11/18; patient saw Dr. Gwenlyn Found yesterday. He has a history of a directional atherectomy followed by a drug-coated balloon angioplasty on the right SFA. 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 His right ABI increased from 0.6 2.85 however the right foot wound has not budged. He is going next Wednesday for an attempt to do tibial artery interventions. He mentions that he has one-vessel runoff via the peroneal.. They are going to attempt to look at the anterior tibial and posterior tibial revascularization next Wednesday. If they were successful at this then I  will consider debridement and changing the topical dressings to this wound. Patient is still in a lot of pain 12/2; the patient underwent revascularization by Dr. Fletcher Anon on 12/19/2019. 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 The posterior tibial artery gives good flow into the whole pedal arch getting collaterals from the peroneal artery the anterior tibial artery was occluded. Unfortunately his right anterior tibial artery is not amenable for revascularization. He had a drug-eluting stent placed in the right common iliac artery into the external iliac artery. The patient arrives saying his pain is about the same. He still has exposed tendon in the base of the wound however there is some healthy looking granulation at the circumference. Perhaps reason for some guarded optimism. Comes in today not wanting debridement asking for pain medication 02/22/2020; patient has not been here in almost 2 months. He has  a punched-out area on the right dorsal foot. Completely necrotic surface. We have been using silver collagen when he was last here the beginning of December he ran out of this a long time ago. I think he has been using Silvadene cream. He follows up with Dr. Fletcher Anon or Dr. Gwenlyn Found next week. He arrives in clinic as usual not wanting debridement unfortunately its debridement is what he needs 2/11; this area on the right foot looks somewhat better than when I saw this 2 weeks ago. I did receive a call from Dr. Gwenlyn Found who went over his PAD history. He had a directional atherectomy followed by drug coated balloon angioplasty of his right SFA on 07/16/2019 his right ABI increased from 0.6-0.85 at that point. He underwent a right external iliac artery stenting by Dr. Fletcher Anon on 12/19/2019 his right SFA was patent as was his peroneal. He did not have a patent posterior tibial at the level of the ankle the only patent vessel to his foot is the anterior tibial artery although that is fortunate in that that is where the or the major wound is. He was not felt to be a candidate for any further angiographic intervention 03/28/2020 upon evaluation today patient appears to be doing well with regard to his wound. Is been tolerating the dressing changes without complication. With that being said he has been using Santyl this definitely seems to be doing better than nothing is for breakdown some of the slough and biofilm on the surface of the wound. Fortunately there is no evidence of active infection which is great news. No fevers, chills, nausea, vomiting, or diarrhea. 3/25; since the patient was last here I was called by Dr. Gwenlyn Found. They do not anticipate any more interventions at this point. I have listed his impressions on the right leg vasculature below " Mr. Mikulak returns today for follow-up. He follows with Dr. Dellia Nims at the wound care clinic for a slowly healing wound on the dorsal aspect of his right foot. He has had 2  interventions on this, one in July 2021 where I revascularized an occluded right SFA. He had one-vessel runoff. He did have spasm of his peroneal which ultimately was shown to have resolved repeat CT angiography. In November he had stenting of a calcified physiologically significant right external iliac artery. His anterior tibial artery does not reach past the ankle and therefore revascularization would be difficult. I would continue with aggressive local wound care.  He also has claudication on the left. I attempted left SFA intervention in July but was unsuccessful in reentering the distal lumen. Unfortunately, he only has 1 patent tibial vessel on the left common anterior tibial, and no evidence of critical limb ischemia so I am hesitant to recommend tibial pedal access for a retrograde approach." He has been using Santyl on the right dorsal foot. Arrives with the wound above 0.5 cm in depth the surface does not look too bad. He is also complaining about pain in the left fifth toe without any antecedent trauma he is aware of. Says that the pain has been there for the last 5 or 6 weeks Objective Constitutional Sitting or standing Blood Pressure is within target range for patient.. Pulse regular and within target range for patient.Marland Kitchen Respirations regular, non-labored and within target range.. Temperature is normal and within the target range for the patient.Marland Kitchen Appears in no distress. Vitals Time Taken: 1:52 PM, Height: 62 in, Weight: 130 lbs, BMI: 23.8, Temperature: 97.4 F, Pulse: 102 bpm, Respiratory Rate: 17 breaths/min, Blood Pressure: 114/74 mmHg. Musculoskeletal The left fifth toe is somewhat inflamed and tender over the DIP. May be a small effusion in this area but there is no open wound. General Notes: Wound exam; using a #3 curette I debrided the wound on the right dorsal foot surrounding eschar thick skin and debris on the surface hemostasis with direct pressure. This does bleed. No  evidence of surrounding infection Integumentary (Hair, Skin) Wound #1 status is Open. Original cause of wound was Gradually Appeared. The date acquired was: 03/26/2019. The wound has been in treatment 35 weeks. The wound is located on the Right,Dorsal Foot. The wound measures 1cm length x 1.3cm width x 0.5cm depth; 1.021cm^2 area and 0.511cm^3 volume. There is tendon and Fat Layer (Subcutaneous Tissue) exposed. There is no tunneling noted, however, there is undermining starting at 12:00 and ending at 12:00 with a maximum distance of 0.4cm. There is a medium amount of serosanguineous drainage noted. The wound margin is well defined and not attached to the wound base. There is large (67-100%) granulation within the wound bed. There is a small (1-33%) amount of necrotic tissue within the wound bed. Assessment Active Problems ICD-10 Other specified peripheral vascular diseases Type 2 diabetes mellitus with foot ulcer Non-pressure chronic ulcer of other part of right foot with fat layer exposed Nicotine dependence, cigarettes, with other nicotine-induced disorders Pain in left foot Procedures Wound #1 Pre-procedure diagnosis of Wound #1 is a Diabetic Wound/Ulcer of the Lower Extremity located on the Right,Dorsal Foot .Severity of Tissue Pre Debridement is: Fat layer exposed. There was a Excisional Skin/Subcutaneous Tissue Debridement with a total area of 1.3 sq cm performed by Ricard Dillon., MD. With the following instrument(s): Curette to remove Non-Viable tissue/material. Material removed includes Eschar, Callus, and Subcutaneous Tissue after achieving pain control using Other. No specimens were taken. A time out was conducted at 14:10, prior to the start of the procedure. A Minimum amount of bleeding was controlled with Pressure. The procedure was tolerated well. Post Debridement Measurements: 1cm length x 1.3cm width x 0.5cm depth; 0.511cm^3 volume. Character of Wound/Ulcer Post Debridement  is stable. Severity of Tissue Post Debridement is: Fat layer exposed. Post procedure Diagnosis Wound #1: Same as Pre-Procedure Plan Follow-up Appointments: Return Appointment in 2 weeks. Bathing/ Shower/ Hygiene: May shower and wash wound with soap and water. - with dressing changes. Edema Control - Lymphedema / SCD / Other: Elevate legs to the level of  the heart or above for 30 minutes daily and/or when sitting, a frequency of: - 3-4 times a throughout the day. Avoid standing for long periods of time. Off-Loading: Wound #1 Right,Dorsal Foot: Open toe surgical shoe to: - ensure no pressure to top of foot. Radiology ordered were: X-ray, foot-Left 5th toe - CPT [phone] : WOUND #1: - Foot Wound Laterality: Dorsal, Right Cleanser: Soap and Water Every Other Day/30 Days Discharge Instructions: May shower and wash wound with dial antibacterial soap and water prior to dressing change. Prim Dressing: Santyl Ointment Every Other Day/30 Days ary Discharge Instructions: Apply nickel thick amount to wound bed as instructed Secondary Dressing: ComfortFoam Border, 4x4 in (silicone border) (DME) (Generic) Every Other Day/30 Days Discharge Instructions: Apply over primary dressing as directed. 1. Frequent for now I continued with Santyl on the right dorsal foot. He does have some granulation that bleeds but there is still depth here not really rapidly filling in 2. He has very limited blood flow in this area. Dr. Gwenlyn Found was reluctant to consider him to retrograde interventions 3. The fifth toe is swollen and tender I will do an x-ray on this. Unrecognized trauma is common in patients like this Electronic Signature(s) Signed: 04/21/2020 9:50:14 AM By: Linton Ham MD Entered By: Linton Ham on 04/18/2020 14:35:53 -------------------------------------------------------------------------------- SuperBill Details Patient Name: Date of Service: [name], [name] 04/18/2020 Medical Record Number:  [id number] Patient Account Number: [id number] Date of Birth/Sex: Treating RN: 03/16/1960 (60 y.o. [name] Primary Care Provider: Kennon Rounds, [facility] NNA Other Clinician: Referring Provider: Treating Provider/Extender: Glennon Mac WE, SHA NNA Weeks in Treatment: 35 Diagnosis Coding ICD-10 Codes Code Description I73.89 Other specified peripheral vascular diseases E11.621 Type 2 diabetes mellitus with foot ulcer L97.512 Non-pressure chronic ulcer of other part of right foot with fat layer exposed I10 Essential (primary) hypertension F17.218 Nicotine dependence, cigarettes, with other nicotine-induced disorders Facility Procedures CPT4 Code: [id number] Description: [id number] - DEB SUBQ TISSUE 20 SQ CM/< ICD-10 Diagnosis Description E11.621 Type 2 diabetes mellitus with foot ulcer L97.512 Non-pressure chronic ulcer of other part of right foot with fat layer exposed Modifier: Quantity: 1 Physician Procedures : CPT4 Code Description Modifier [id number] - WC PHYS SUBQ TISS 20 SQ CM ICD-10 Diagnosis Description E11.621 Type 2 diabetes mellitus with foot ulcer L97.512 Non-pressure chronic ulcer of other part of right foot with fat layer exposed Quantity: 1 Electronic Signature(s) Signed: 04/21/2020 9:50:14 AM By: Linton Ham MD Entered By: Linton Ham on 04/18/2020 14:36:04

## 2020-04-23 NOTE — Progress Notes (Signed)
TRAYQUAN, KOLAKOWSKI ([id number]) Visit Report for 04/18/2020 Arrival Information Details Patient Name: Date of Service: [name] 04/18/2020 1:45 PM Medical Record Number: [id number] Patient Account Number: [id number] Date of Birth/Sex: Treating RN: Jun 22, 1960 (60 y.o. Erie Noe Primary Care Booker Bhatnagar: Kennon Rounds, [facility] NNA Other Clinician: Referring Venita Seng: Treating Marylynn Rigdon/Extender: Glennon Mac WE, SHA NNA Weeks in Treatment: 53 Visit Information History Since Last Visit Added or deleted any medications: No Patient Arrived: Ambulatory Any new allergies or adverse reactions: No Arrival Time: 13:50 Had a fall or experienced change in No Accompanied By: self activities of daily living that may affect Transfer Assistance: None risk of falls: Patient Identification Verified: Yes Signs or symptoms of abuse/neglect since last visito No Secondary Verification Process Completed: Yes Hospitalized since last visit: No Patient Requires Transmission-Based Precautions: No Implantable device outside of the clinic excluding No Patient Has Alerts: Yes cellular tissue based products placed in the center Patient Alerts: Patient on Blood Thinner since last visit: R ABI: 0.85 R TBI: 0.50 Has Dressing in Place as Prescribed: Yes Pain Present Now: No Electronic Signature(s) Signed: 04/18/2020 2:16:37 PM By: Rhae Hammock RN Entered By: Rhae Hammock on 04/18/2020 13:52:18 -------------------------------------------------------------------------------- Encounter Discharge Information Details Patient Name: Date of Service: [name]. 04/18/2020 1:45 PM Medical Record Number: [id number] Patient Account Number: [id number] Date of Birth/Sex: Treating RN: 24-Oct-1960 (60 y.o. [name] Primary Care Aidenn Skellenger: Kennon Rounds, [facility] NNA Other Clinician: Referring Brenly Trawick: Treating Sage Hammill/Extender: Glennon Mac WE, SHA NNA Weeks in Treatment: 54 Encounter  Discharge Information Items Post Procedure Vitals Discharge Condition: Stable Temperature (F): 97.4 Ambulatory Status: Ambulatory Pulse (bpm): 102 Discharge Destination: Home Respiratory Rate (breaths/min): 17 Transportation: Private Auto Blood Pressure (mmHg): 114/74 Accompanied By: self Schedule Follow-up Appointment: Yes Clinical Summary of Care: Electronic Signature(s) Signed: 04/18/2020 6:38:23 PM By: Deon Pilling Entered By: Deon Pilling on 04/18/2020 18:11:50 -------------------------------------------------------------------------------- Lower Extremity Assessment Details Patient Name: Date of Service: [name] 04/18/2020 1:45 PM Medical Record Number: [id number] Patient Account Number: [id number] Date of Birth/Sex: Treating RN: Mar 03, 1960 (60 y.o. Burnadette Pop, Lauren Primary Care Sharica Roedel: Kennon Rounds, [facility] NNA Other Clinician: Referring Metha Kolasa: Treating Liliah Dorian/Extender: Glennon Mac WE, SHA NNA Weeks in Treatment: 35 Edema Assessment Assessed: [Left: No] [Right: Yes] Edema: [Left: N] [Right: o] Calf Left: Right: Point of Measurement: 28 cm From Medial Instep 27 cm Ankle Left: Right: Point of Measurement: 9 cm From Medial Instep 18 cm Vascular Assessment Pulses: Dorsalis Pedis Palpable: [Right:Yes] Posterior Tibial Palpable: [Right:Yes] Electronic Signature(s) Signed: 04/18/2020 2:16:37 PM By: Rhae Hammock RN Entered By: Rhae Hammock on 04/18/2020 13:59:35 -------------------------------------------------------------------------------- Multi Wound Chart Details Patient Name: Date of Service: [name]. 04/18/2020 1:45 PM Medical Record Number: [id number] Patient Account Number: [id number] Date of Birth/Sex: Treating RN: 02-22-1960 (60 y.o. 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] [N/A:N/A] Secondary Etiology: [1:Hypertension, Peripheral Arterial] [N/A:N/A] Comorbid History: [1:Disease, Type II Diabetes, Neuropathy 03/26/2019] [N/A:N/A] Date Acquired: [1:35] [N/A:N/A] Weeks of Treatment: [1:Open] [N/A:N/A] Wound Status: [1:1x1.3x0.5] [N/A:N/A] Measurements L x W x D (cm) [1:1.021] [N/A:N/A] A (  cm) : rea [1:0.511] [N/A:N/A] Volume (cm) : [1:63.90%] [N/A:N/A] % Reduction in A [1:rea: -80.60%] [N/A:N/A] % Reduction in Volume: [1:12] Starting Position 1 (o'clock): [1:12] Ending Position 1 (o'clock): [1:0.4] Maximum Distance 1 (cm): [1:Yes] [N/A:N/A] Undermining: [1:Grade 2] [N/A:N/A] Classification: [1:Medium] [N/A:N/A] Exudate A mount: [1:Serosanguineous] [N/A:N/A] Exudate Type: [1:red, brown] [N/A:N/A] Exudate Color: [1:Well defined, not attached] [N/A:N/A] Wound Margin: [1:Large (67-100%)] [N/A:N/A] Granulation A mount: [1:Small (1-33%)] [N/A:N/A] Necrotic A mount: [1:Fat Layer (Subcutaneous Tissue): Yes N/A] Exposed Structures: [1:Tendon: Yes Fascia: No Muscle: No Joint: No Bone: No Small (1-33%)] [N/A:N/A] Epithelialization: [1:Debridement - Excisional] [N/A:N/A] Debridement: Pre-procedure Verification/Time Out 14:10 [N/A:N/A] Taken: [1:Other] [N/A:N/A] Pain Control: [1:Subcutaneous] [N/A:N/A] Tissue Debrided: [1:Skin/Subcutaneous Tissue] [N/A:N/A] Level: [1:1.3] [N/A:N/A] Debridement A (sq cm): [1:rea Curette] [N/A:N/A] Instrument: [1:Minimum] [N/A:N/A] Bleeding: [1:Pressure] [N/A:N/A] Hemostasis A chieved: [1:Procedure was tolerated well]  [N/A:N/A] Debridement Treatment Response: [1:1x1.3x0.5] [N/A:N/A] Post Debridement Measurements L x W x D (cm) [1:0.511] [N/A:N/A] Post Debridement Volume: (cm) [1:Debridement] [N/A:N/A] Treatment Notes Electronic Signature(s) Signed: 04/18/2020 6:16:52 PM By: Lorrin Jackson Signed: 04/21/2020 9:50:14 AM By: Linton Ham MD Entered By: Linton Ham on 04/18/2020 14:28:06 -------------------------------------------------------------------------------- Multi-Disciplinary Care Plan Details Patient Name: Date of Service: [place] RD, [name] 04/18/2020 1:45 PM Medical Record Number: [id number] Patient Account Number: [id number] Date of Birth/Sex: Treating RN: February 19, 1960 (60 y.o. Marcheta Grammes Primary Care Takota Cahalan: Kennon Rounds, [facility] NNA Other Clinician: Referring Adaia Matthies: Treating Eryk Beavers/Extender: Dortha Schwalbe, SHA NNA Weeks in Treatment: [place] reviewed with physician Active Inactive Nutrition Nursing Diagnoses: Impaired glucose control: actual or potential Potential for alteratiion in Nutrition/Potential for imbalanced nutrition Goals: Patient/caregiver will maintain therapeutic glucose control Date Initiated: 08/15/2019 Target Resolution Date: 05/09/2020 Goal Status: Active Interventions: Assess HgA1c results as ordered upon admission and as needed Assess patient nutrition upon admission and as needed per policy Treatment Activities: Patient referred to Primary Care Physician for further nutritional evaluation : 08/15/2019 Notes: 04/18/20: Glucose control ongoing. Wound/Skin Impairment Nursing Diagnoses: Impaired tissue integrity Knowledge deficit related to smoking impact on wound healing Knowledge deficit related to ulceration/compromised skin integrity Goals: Patient will demonstrate a reduced rate of smoking or cessation of smoking Date Initiated: 08/15/2019 Target Resolution Date: 05/09/2020 Goal Status: Active Patient/caregiver  will verbalize understanding of skin care regimen Date Initiated: 08/15/2019 Target Resolution Date: 05/09/2020 Goal Status: Active Ulcer/skin breakdown will have a volume reduction of 30% by week 4 Date Initiated: 08/15/2019 Date Inactivated: 09/21/2019 Target Resolution Date: 09/12/2019 Goal Status: Met Interventions: Assess patient/caregiver ability to obtain necessary supplies Assess patient/caregiver ability to perform ulcer/skin care regimen upon admission and as needed Assess ulceration(s) every visit Provide education on ulcer and skin care Treatment Activities: Skin care regimen initiated : 08/15/2019 Topical wound management initiated : 08/15/2019 Notes: Electronic Signature(s) Signed: 04/18/2020 1:52:56 PM By: Lorrin Jackson Entered By: Lorrin Jackson on 04/18/2020 13:52:56 -------------------------------------------------------------------------------- Pain Assessment Details Patient Name: Date of Service: [name]. 04/18/2020 1:45 PM Medical Record Number: [id number] Patient Account Number: [id number] Date of Birth/Sex: Treating RN: April 28, 1960 (60 y.o. Burnadette Pop, Lauren Primary Care Stillman Buenger: Kennon Rounds, [facility] NNA Other Clinician: Referring Rayquon Uselman: Treating Tyner Codner/Extender: Glennon Mac WE, SHA NNA Weeks in Treatment: 35 Active Problems Location of Pain Severity and Description of Pain Patient Has Paino Yes Site Locations Pain Location: Pain Location: Pain in Ulcers With Dressing Change: Yes Duration of the Pain. Constant / Intermittento Constant Rate the pain. Current Pain Level: 7 Worst Pain Level: 10 Least Pain Level: 0 Tolerable Pain Level:  7 Character of Pain Describe the Pain: Aching Pain Management and Medication Current Pain Management: Medication: Yes Cold Application: No Rest: Yes Massage: No Activity: No T.E.N.S.: No Heat Application: No Leg drop or elevation: No Is the Current Pain Management Adequate: Adequate How does  your wound impact your activities of daily livingo Sleep: No Bathing: No Appetite: No Relationship With Others: No Bladder Continence: No Emotions: No Bowel Continence: No Work: No Toileting: No Drive: No Dressing: No Hobbies: No Electronic Signature(s) Signed: 04/18/2020 2:16:37 PM By: Rhae Hammock RN Entered By: Rhae Hammock on 04/18/2020 13:53:05 -------------------------------------------------------------------------------- Patient/Caregiver Education Details Patient Name: Date of Service: [name] 3/25/2022andnbsp1:45 PM Medical Record Number: [id number] Patient Account Number: [id number] Date of Birth/Gender: Treating RN: 11/02/1960 (60 y.o. Marcheta Grammes Primary Care Physician: Kennon Rounds, [facility] NNA Other Clinician: Referring Physician: Treating Physician/Extender: Dortha Schwalbe, SHA NNA Weeks in Treatment: 51 Education Assessment Education Provided To: Patient Education Topics Provided Elevated Blood Sugar/ Impact on Healing: Methods: Explain/Verbal, Printed Responses: State content correctly Smoking and Wound Healing: Methods: Explain/Verbal Responses: State content correctly Wound/Skin Impairment: Methods: Explain/Verbal, Printed Responses: State content correctly Electronic Signature(s) Signed: 04/18/2020 6:16:52 PM By: Lorrin Jackson Entered By: Lorrin Jackson on 04/18/2020 13:53:28 -------------------------------------------------------------------------------- Wound Assessment Details Patient Name: Date of Service: [name]. 04/18/2020 1:45 PM Medical Record Number: [id number] Patient Account Number: [id number] Date of Birth/Sex: Treating RN: [birth year] (59 y.o. Marcheta Grammes Primary Care Thelma Lorenzetti: Kennon Rounds, [facility] NNA Other Clinician: Referring Shadie Sweatman: Treating Kalvin Buss/Extender: Glennon Mac WE, SHA NNA Weeks in Treatment: 35 Wound Status Wound Number: 1 Primary Diabetic Wound/Ulcer of the Lower  Extremity Etiology: Wound Location: Right, Dorsal Foot Secondary Arterial Insufficiency Ulcer Wounding Event: Gradually Appeared Etiology: Date Acquired: 03/26/2019 Wound Status: Open Weeks Of Treatment: 35 Comorbid Hypertension, Peripheral Arterial Disease, Type II Diabetes, Clustered Wound: No History: Neuropathy Photos Wound Measurements Length: (cm) 1 Width: (cm) 1.3 Depth: (cm) 0.5 Area: (cm) 1.021 Volume: (cm) 0.511 % Reduction in Area: 63.9% % Reduction in Volume: -80.6% Epithelialization: Small (1-33%) Tunneling: No Undermining: Yes Starting Position (o'clock): 12 Ending Position (o'clock): 12 Maximum Distance: (cm) 0.4 Wound Description Classification: Grade 2 Wound Margin: Well defined, not attached Exudate Amount: Medium Exudate Type: Serosanguineous Exudate Color: red, brown Foul Odor After Cleansing: No Slough/Fibrino Yes Wound Bed Granulation Amount: Large (67-100%) Exposed Structure Necrotic Amount: Small (1-33%) Fascia Exposed: No Fat Layer (Subcutaneous Tissue) Exposed: Yes Tendon Exposed: Yes Muscle Exposed: No Joint Exposed: No Bone Exposed: No Treatment Notes Wound #1 (Foot) Wound Laterality: Dorsal, Right Cleanser Soap and Water Discharge Instruction: May shower and wash wound with dial antibacterial soap and water prior to dressing change. Peri-Wound Care Topical Primary Dressing Santyl Ointment Discharge Instruction: Apply nickel thick amount to wound bed as instructed Secondary Dressing ComfortFoam Border, 4x4 in (silicone border) Discharge Instruction: Apply over primary dressing as directed. Secured With Compression Wrap Compression Stockings Environmental education officer) Signed: 04/18/2020 6:16:52 PM By: Lorrin Jackson Signed: 04/23/2020 9:05:34 AM By: Sandre Kitty Entered By: Sandre Kitty on 04/18/2020 17:28:14 -------------------------------------------------------------------------------- [place] Details Patient  Name: Date of Service: [name], [name] 04/18/2020 1:45 PM Medical Record Number: [id number] Patient Account Number: [id number] Date of Birth/Sex: Treating RN: 07/21/1960 (60 y.o. Burnadette Pop, Lauren Primary Care Jiselle Sheu: Kennon Rounds, [facility] NNA Other Clinician: Referring Yonas Bunda: Treating Mithra Spano/Extender: Glennon Mac WE, SHA NNA Weeks in Treatment: 35 Vital Signs Time Taken: 13:52 Temperature (F): 97.4 Height (in): 62 Pulse (bpm): 102 Weight (  lbs): 130 Respiratory Rate (breaths/min): 17 Body Mass Index (BMI): 23.8 Blood Pressure (mmHg): 114/74 Reference Range: 80 - 120 mg / dl Electronic Signature(s) Signed: 04/18/2020 2:16:37 PM By: Rhae Hammock RN Entered By: Rhae Hammock on 04/18/2020 13:52:37

## 2020-05-02 ENCOUNTER — Encounter (HOSPITAL_BASED_OUTPATIENT_CLINIC_OR_DEPARTMENT_OTHER): Payer: Medicare Other | Admitting: Internal Medicine

## 2020-05-09 ENCOUNTER — Other Ambulatory Visit: Payer: Self-pay

## 2020-05-09 ENCOUNTER — Encounter (HOSPITAL_BASED_OUTPATIENT_CLINIC_OR_DEPARTMENT_OTHER): Payer: Medicare Other | Attending: Internal Medicine | Admitting: Internal Medicine

## 2020-05-09 DIAGNOSIS — F17218 Nicotine dependence, cigarettes, with other nicotine-induced disorders: Secondary | ICD-10-CM | POA: Insufficient documentation

## 2020-05-09 DIAGNOSIS — E11621 Type 2 diabetes mellitus with foot ulcer: Secondary | ICD-10-CM | POA: Diagnosis not present

## 2020-05-09 DIAGNOSIS — M79672 Pain in left foot: Secondary | ICD-10-CM | POA: Diagnosis not present

## 2020-05-09 DIAGNOSIS — E1151 Type 2 diabetes mellitus with diabetic peripheral angiopathy without gangrene: Secondary | ICD-10-CM | POA: Insufficient documentation

## 2020-05-09 DIAGNOSIS — E1140 Type 2 diabetes mellitus with diabetic neuropathy, unspecified: Secondary | ICD-10-CM | POA: Insufficient documentation

## 2020-05-09 DIAGNOSIS — L97512 Non-pressure chronic ulcer of other part of right foot with fat layer exposed: Secondary | ICD-10-CM | POA: Diagnosis not present

## 2020-05-09 DIAGNOSIS — L97519 Non-pressure chronic ulcer of other part of right foot with unspecified severity: Secondary | ICD-10-CM | POA: Diagnosis present

## 2020-05-09 NOTE — Progress Notes (Signed)
ELEK, RINALDO ([id number]) Visit Report for 05/09/2020 Debridement Details Patient Name: Date of Service: [name] 05/09/2020 2:45 PM Medical Record Number: [id number] Patient Account Number: [id number] Date of Birth/Sex: Treating RN: 03-17-1960 (60 y.o. Marcheta Grammes Primary Care Provider: Cipriano Mile Other Clinician: Referring Provider: Treating Provider/Extender: Laroy Apple Weeks in Treatment: 38 Debridement Performed for Assessment: Wound #1 Right,Dorsal Foot Performed By: Physician Ricard Dillon., MD Debridement Type: Chemical/Enzymatic/Mechanical Agent Used: Santyl Severity of Tissue Pre Debridement: Fat layer exposed Level of Consciousness (Pre-procedure): Awake and Alert Pre-procedure Verification/Time Out Yes - 15:07 Taken: Start Time: 15:08 Bleeding: None Response to Treatment: Procedure was tolerated well Level of Consciousness (Post- Awake and Alert procedure): Post Debridement Measurements of Total Wound Length: (cm) 0.5 Width: (cm) 0.4 Depth: (cm) 0.2 Volume: (cm) 0.031 Character of Wound/Ulcer Post Debridement: Stable Severity of Tissue Post Debridement: Fat layer exposed Post Procedure Diagnosis Same as Pre-procedure Electronic Signature(s) Signed: 05/09/2020 4:57:27 PM By: Linton Ham MD Signed: 05/09/2020 5:21:53 PM By: Lorrin Jackson Previous Signature: 05/09/2020 3:12:06 PM Version By: Lorrin Jackson Entered By: Linton Ham on 05/09/2020 15:20:21 -------------------------------------------------------------------------------- HPI Details Patient Name: Date of Service: [name] RD, [name] 05/09/2020 2:45 PM Medical Record Number: [id number] Patient Account Number: [id number] Date of Birth/Sex: Treating RN: Oct 01, 1960 (60 y.o. Marcheta Grammes Primary Care Provider: Cipriano Mile Other Clinician: Referring Provider: Treating Provider/Extender: Laroy Apple Weeks in Treatment:  80 History of Present Illness HPI Description: 08/15/2019 upon evaluation today patient presents for evaluation here in our clinic concerning issues that he is having with wounds on the dorsal surface of his right foot. This is something that was noted to occur after he was obtaining cortisone injections with [facility]. Subsequently he developed ulcerations he was then referred to Dr. Alvester Chou who performed arterial testing and subsequently found that the patient had poor arterial flow with an ABI of 0.6 and a TBI of 0.26. With that being said he has since on 07/16/2019 had an angiogram on the right with improvement of his ABI to 0.85 and TBI to 0.5. Obviously this is not perfect but is definitely far superior to where it was previous. He does have some necrotic tissue on the surface of the wounds is going to need debriding away he has been using Silvadene on this but to be honest that is not can to do anything for these wounds. He needs something more to clear this away sharp debridement is ideal and we may even look towards Santyl as well. The patient does have a history of diabetes noted in his chart though is not on medication and his A1c was around 6.4 so is not terrible. With that being said he does have known peripheral vascular disease he is actually have an angiogram on the left tomorrow. He also has hypertension and he does smoke. 8/27; this is a patient has been here once before about 6 weeks ago. Since then he was hospitalized from 7/22 through 7/23 by Dr. Gwenlyn Found for an attempt at revascularization. He had previously been seen by Dr. Amalia Hailey of podiatry. He is a continued tobacco smoker. His angiogram on 6/21 via revealed a occluded SFAs bilaterally with one-vessel runoff. He had a atherectomy followed by a drug-coated balloon angioplasty of the right SFA. 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 He had some improvement in his right ABI from 0.6-0.85. There is  also an attempt to open  his left SFA but that was unsuccessful. He does not have a wound on the left side. We are using Santyl on the patient's wounds on the right dorsal foot. He says he has about a 1 minute exercise tolerance before stopping because of pain compatible with claudication 9/10; we are using Santyl on the wound on the right dorsal foot. Still has significant claudication symptoms. He is status post angioplasty of the right SFA and atherectomy. He has one-vessel runoff via a diseased peroneal. He follows up with Dr. Gwenlyn Found on 9/14 9/17; I had my notes from last week that the patient was to see Dr. Gwenlyn Found on 9/14 he tells me that the appointment is actually on 9/21 although I do not see that in epic. The next appointment I see with Dr. Gwenlyn Found is on 11/17. Nevertheless the patient is fairly adamant that his appointment is early next week I told him to call ahead before he goes. The wound is a lot better we have been using Santyl over that he just ran out of that all represcribe it. 10/1; patient arrives today with nothing on his wound. He says he has been using the Santyl but he feels the border foam is too painful. We told him he needs to put a covering on this of some sort perhaps gauze. Indeed after our discussion last time his appointment with Dr. Gwenlyn Found 11/17. Previously he has had a atherectomy and an angioplasty of the right SFA. I note in my previous notes it was said he had bilateral one-vessel runoff I'll need to review the tibial vessels on the angiogram. The patient is currently rating his pain at 8 out of 10 He followed up with Dr. Amalia Hailey at [facility] on 9/27. He emphasized to follow-up with vascular and [place]. Follow-up with them as needed. 10/15; patient ran out of Santyl and has been using Silvadene. He still has constant pain which I think is largely claudication. I have looked over his angiogram results. I need to communicate with Dr. Gwenlyn Found to see if anything else can  be done here. He arrives in clinic today with tendon over most of the wound surface area 10/29; we are using silver collagen on the right foot. Still complaining of a lot of pain especially at night. Dr. Gwenlyn Found is out of town this week I will send him an email but he has a follow-up appointment on 11/17. 11/18; patient saw Dr. Gwenlyn Found yesterday. He has a history of a directional atherectomy followed by a drug-coated balloon angioplasty on the right SFA. 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 His right ABI increased from 0.6 2.85 however the right foot wound has not budged. He is going next Wednesday for an attempt to do tibial artery interventions. He mentions that he has one-vessel runoff via the peroneal.. They are going to attempt to look at the anterior tibial and posterior tibial revascularization next Wednesday. If they were successful at this then I will consider debridement and changing the topical dressings to this wound. Patient is still in a lot of pain 12/2; the patient underwent revascularization by Dr. Fletcher Anon on 12/19/2019. 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 The posterior tibial artery gives good flow into the whole pedal arch getting  collaterals from the peroneal artery the anterior tibial artery was occluded. Unfortunately his right anterior tibial artery is not amenable for revascularization. He had a drug-eluting stent placed in the right common iliac artery into the external iliac artery. The patient arrives saying his pain is about the same. He still has exposed tendon in the base of the wound however there is some healthy looking granulation at the circumference. Perhaps reason for some guarded optimism. Comes in today not wanting  debridement asking for pain medication 02/22/2020; patient has not been here in almost 2 months. He has a punched-out area on the right dorsal foot. Completely necrotic surface. We have been using silver collagen when he was last here the beginning of December he ran out of this a long time ago. I think he has been using Silvadene cream. He follows up with Dr. Fletcher Anon or Dr. Gwenlyn Found next week. He arrives in clinic as usual not wanting debridement unfortunately its debridement is what he needs 2/11; this area on the right foot looks somewhat better than when I saw this 2 weeks ago. I did receive a call from Dr. Gwenlyn Found who went over his PAD history. He had a directional atherectomy followed by drug coated balloon angioplasty of his right SFA on 07/16/2019 his right ABI increased from 0.6-0.85 at that point. He underwent a right external iliac artery stenting by Dr. Fletcher Anon on 12/19/2019 his right SFA was patent as was his peroneal. He did not have a patent posterior tibial at the level of the ankle the only patent vessel to his foot is the anterior tibial artery although that is fortunate in that that is where the or the major wound is. He was not felt to be a candidate for any further angiographic intervention 03/28/2020 upon evaluation today patient appears to be doing well with regard to his wound. Is been tolerating the dressing changes without complication. With that being said he has been using Santyl this definitely seems to be doing better than nothing is for breakdown some of the slough and biofilm on the surface of the wound. Fortunately there is no evidence of active infection which is great news. No fevers, chills, nausea, vomiting, or diarrhea. 3/25; since the patient was last here I was called by Dr. Gwenlyn Found. They do not anticipate any more interventions at this point. I have listed his impressions on the right leg vasculature below " Mr. Canty returns today for follow-up. He follows with Dr. Dellia Nims  at the wound care clinic for a slowly healing wound on the dorsal aspect of his right foot. He has had 2 interventions on this, one in July 2021 where I revascularized an occluded right SFA. He had one-vessel runoff. He did have spasm of his peroneal which ultimately was shown to have resolved repeat CT angiography. In November he had stenting of a calcified physiologically significant right external iliac artery. His anterior tibial artery does not reach past the ankle and therefore revascularization would be difficult. I would continue with aggressive local wound care. He also has claudication on the left. I attempted left SFA intervention in July but was unsuccessful in reentering the distal lumen. Unfortunately, he only has 1 patent tibial vessel on the left common anterior tibial, and no evidence of critical limb ischemia so I am hesitant to recommend tibial pedal access for a retrograde approach." He has been using Santyl on the right dorsal foot. Arrives with the wound above 0.5 cm in depth the surface does not look  too bad. He is also complaining about pain in the left fifth toe without any antecedent trauma he is aware of. Says that the pain has been there for the last 5 or 6 weeks 4/15; the area on his right dorsal foot perhaps slightly better. We have been using Santyl. Dr. Kennon Holter last opinions on his vascular supply are listed in my note of 04/18/2020. Again he complains of pain in the left fifth toe. We ordered an x-ray of this almost 3 weeks ago. He did not get it done Electronic Signature(s) Signed: 05/09/2020 4:57:27 PM By: Linton Ham MD Entered By: Linton Ham on 05/09/2020 15:21:42 -------------------------------------------------------------------------------- Physical Exam Details Patient Name: Date of Service: [name]. 05/09/2020 2:45 PM Medical Record Number: [id number] Patient Account Number: [id number] Date of Birth/Sex: Treating RN: 11/24/1960 (60 y.o.  Marcheta Grammes Primary Care Provider: Cipriano Mile Other Clinician: Referring Provider: Treating Provider/Extender: Laroy Apple Weeks in Treatment: 38 Constitutional Sitting or standing Blood Pressure is within target range for patient.. Pulse regular and within target range for patient.Marland Kitchen Respirations regular, non-labored and within target range.. Temperature is normal and within the target range for the patient.Marland Kitchen Appears in no distress. Cardiovascular Pedal pulses absent bilaterally. However his feet are warm especially on the right suggesting he has some perfusion. Musculoskeletal The patient has erythema and tenderness to below the PIP on the left fifth toe. This is the same as it was 3 weeks ago and probably represents some form of inflammation or arthritis although cannot rule out a fracture. Notes Wound exam; I did not debride the wound today. There there may be a combination of new skin and some debris I cannot really tell at this point. The granulation is pale but I think the area may have contracted. I really do not want to disturb this at this point. Electronic Signature(s) Signed: 05/09/2020 4:57:27 PM By: Linton Ham MD Entered By: Linton Ham on 05/09/2020 15:23:22 -------------------------------------------------------------------------------- Physician Orders Details Patient Name: Date of Service: [name], [name] 05/09/2020 2:45 PM Medical Record Number: [id number] Patient Account Number: [id number] Date of Birth/Sex: Treating RN: March 22, 1960 (60 y.o. Marcheta Grammes Primary Care Provider: Cipriano Mile Other Clinician: Referring Provider: Treating Provider/Extender: Laroy Apple Weeks in Treatment: 67 Verbal / Phone Orders: No Diagnosis Coding ICD-10 Coding Code Description I73.89 Other specified peripheral vascular diseases E11.621 Type 2 diabetes mellitus with foot ulcer L97.512 Non-pressure chronic ulcer of  other part of right foot with fat layer exposed F17.218 Nicotine dependence, cigarettes, with other nicotine-induced disorders M79.672 Pain in left foot Follow-up Appointments Return Appointment in 2 weeks. Bathing/ Shower/ Hygiene May shower and wash wound with soap and water. - with dressing changes. Edema Control - Lymphedema / SCD / Other Elevate legs to the level of the heart or above for 30 minutes daily and/or when sitting, a frequency of: - 3-4 times a throughout the day. Avoid standing for long periods of time. Off-Loading Wound #1 Right,Dorsal Foot Open toe surgical shoe to: - ensure no pressure to top of foot. Wound Treatment Wound #1 - Foot Wound Laterality: Dorsal, Right Cleanser: Soap and Water Every Other Day/30 Days Discharge Instructions: May shower and wash wound with dial antibacterial soap and water prior to dressing change. Prim Dressing: Santyl Ointment ary Every Other Day/30 Days Discharge Instructions: Apply nickel thick amount to wound bed as instructed Secondary Dressing: ComfortFoam Border, 4x4 in (silicone border) (Generic) Every Other Day/30 Days Discharge Instructions: Apply over primary  dressing as directed. Radiology X-ray, foot:Left Foot - CPT F3413349 - (ICD10 [id number] - Pain in left foot) : Patient Medications llergies: No Known Drug Allergies A Notifications Medication Indication Start End 05/09/2020 Santyl DOSE topical 250 unit/gram ointment - ointment topical to affected area daily Electronic Signature(s) Signed: 05/09/2020 3:18:37 PM By: Linton Ham MD Previous Signature: 05/09/2020 3:01:58 PM Version By: Lorrin Jackson Entered By: Linton Ham on 05/09/2020 15:18:36 Prescription 05/09/2020 -------------------------------------------------------------------------------- Margaree Mackintosh MD Patient Name: Provider: 1960-11-21 [id number] Date of Birth: NPI#: [id number] Sex: DEA #: [phone] [id number] Phone  #: License #: [place] Patient Address: [address] [place], [place] [address] [place], [place] [address] [phone] Allergies No Known Drug Allergies Provider's Orders X-ray, foot:Left Foot - ICD10: M79.672 - CPT F3413349 : Hand Signature: Date(s): Electronic Signature(s) Signed: 05/09/2020 4:57:27 PM By: Linton Ham MD Entered By: Linton Ham on 05/09/2020 15:18:37 -------------------------------------------------------------------------------- Problem List Details Patient Name: Date of Service: [name], [name] 05/09/2020 2:45 PM Medical Record Number: [id number] Patient Account Number: [id number] Date of Birth/Sex: Treating RN: May 08, 1960 (60 y.o. Marcheta Grammes Primary Care Provider: Cipriano Mile Other Clinician: Referring Provider: Treating Provider/Extender: Laroy Apple Weeks in Treatment: 64 Active Problems ICD-10 Encounter Code Description Active Date MDM Diagnosis I73.89 Other specified peripheral vascular diseases 08/15/2019 No Yes E11.621 Type 2 diabetes mellitus with foot ulcer 08/15/2019 No Yes L97.512 Non-pressure chronic ulcer of other part of right foot with fat layer exposed 08/15/2019 No Yes F17.218 Nicotine dependence, cigarettes, with other nicotine-induced disorders 08/15/2019 No Yes M79.672 Pain in left foot 04/18/2020 No Yes Inactive Problems ICD-10 Code Description Active Date Inactive Date I10 Essential (primary) hypertension 08/15/2019 08/15/2019 Resolved Problems Electronic Signature(s) Signed: 05/09/2020 4:57:27 PM By: Linton Ham MD Previous Signature: 05/09/2020 3:01:32 PM Version By: Lorrin Jackson Entered By: Linton Ham on 05/09/2020 15:20:02 -------------------------------------------------------------------------------- Progress Note Details Patient Name: Date of Service: [name]. 05/09/2020 2:45 PM Medical  Record Number: [id number] Patient Account Number: [id number] Date of Birth/Sex: Treating RN: 19-Oct-1960 (60 y.o. Marcheta Grammes Primary Care Provider: Cipriano Mile Other Clinician: Referring Provider: Treating Provider/Extender: Laroy Apple Weeks in Treatment: 38 Subjective History of Present Illness (HPI) 08/15/2019 upon evaluation today patient presents for evaluation here in our clinic concerning issues that he is having with wounds on the dorsal surface of his right foot. This is something that was noted to occur after he was obtaining cortisone injections with [facility]. Subsequently he developed ulcerations he was then referred to Dr. Alvester Chou who performed arterial testing and subsequently found that the patient had poor arterial flow with an ABI of 0.6 and a TBI of 0.26. With that being said he has since on 07/16/2019 had an angiogram on the right with improvement of his ABI to 0.85 and TBI to 0.5. Obviously this is not perfect but is definitely far superior to where it was previous. He does have some necrotic tissue on the surface of the wounds is going to need debriding away he has been using Silvadene on this but to be honest that is not can to do anything for these wounds. He needs something more to clear this away sharp debridement is ideal and we may even look towards Santyl as well. The patient does have a history of diabetes noted in his chart though is not on medication and his A1c was around 6.4 so  is not terrible. With that being said he does have known peripheral vascular disease he is actually have an angiogram on the left tomorrow. He also has hypertension and he does smoke. 8/27; this is a patient has been here once before about 6 weeks ago. Since then he was hospitalized from 7/22 through 7/23 by Dr. Gwenlyn Found for an attempt at revascularization. He had previously been seen by Dr. Amalia Hailey of podiatry. He is a continued tobacco smoker. His angiogram on  6/21 via revealed a occluded SFAs bilaterally with one-vessel runoff. He had a atherectomy followed by a drug-coated balloon angioplasty of the right SFA. 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 He had some improvement in his right ABI from 0.6-0.85. There is also an attempt to open his left SFA but that was unsuccessful. He does not have a wound on the left side. We are using Santyl on the patient's wounds on the right dorsal foot. He says he has about a 1 minute exercise tolerance before stopping because of pain compatible with claudication 9/10; we are using Santyl on the wound on the right dorsal foot. Still has significant claudication symptoms. He is status post angioplasty of the right SFA and atherectomy. He has one-vessel runoff via a diseased peroneal. He follows up with Dr. Gwenlyn Found on 9/14 9/17; I had my notes from last week that the patient was to see Dr. Gwenlyn Found on 9/14 he tells me that the appointment is actually on 9/21 although I do not see that in epic. The next appointment I see with Dr. Gwenlyn Found is on 11/17. Nevertheless the patient is fairly adamant that his appointment is early next week I told him to call ahead before he goes. The wound is a lot better we have been using Santyl over that he just ran out of that all represcribe it. 10/1; patient arrives today with nothing on his wound. He says he has been using the Santyl but he feels the border foam is too painful. We told him he needs to put a covering on this of some sort perhaps gauze. Indeed after our discussion last time his appointment with Dr. Gwenlyn Found 11/17. Previously he has had a atherectomy and an angioplasty of the right SFA. I note in my previous notes it was said he had bilateral one-vessel runoff I'll need to review the tibial vessels on the angiogram. The patient is currently rating his pain at 8 out of 10 He followed up with Dr. Amalia Hailey at [facility] on 9/27. He emphasized to  follow-up with vascular and [place]. Follow-up with them as needed. 10/15; patient ran out of Santyl and has been using Silvadene. He still has constant pain which I think is largely claudication. I have looked over his angiogram results. I need to communicate with Dr. Gwenlyn Found to see if anything else can be done here. He arrives in clinic today with tendon over most of the wound surface area 10/29; we are using silver collagen on the right foot. Still complaining of a lot of pain especially at night. Dr. Gwenlyn Found is out of town this week I will send him an email but he has a follow-up appointment on 11/17. 11/18; patient saw Dr. Gwenlyn Found yesterday. He has a history of a directional atherectomy followed by a drug-coated balloon angioplasty on the right SFA. 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 His right ABI increased from  0.6 2.85 however the right foot wound has not budged. He is going next Wednesday for an attempt to do tibial artery interventions. He mentions that he has one-vessel runoff via the peroneal.. They are going to attempt to look at the anterior tibial and posterior tibial revascularization next Wednesday. If they were successful at this then I will consider debridement and changing the topical dressings to this wound. Patient is still in a lot of pain 12/2; the patient underwent revascularization by Dr. Fletcher Anon on 12/19/2019. 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 The posterior tibial artery gives good flow into the whole pedal arch getting collaterals from the peroneal artery the anterior tibial artery was occluded. Unfortunately his right anterior tibial artery is not amenable for revascularization. He had a drug-eluting stent placed in the right common  iliac artery into the external iliac artery. The patient arrives saying his pain is about the same. He still has exposed tendon in the base of the wound however there is some healthy looking granulation at the circumference. Perhaps reason for some guarded optimism. Comes in today not wanting debridement asking for pain medication 02/22/2020; patient has not been here in almost 2 months. He has a punched-out area on the right dorsal foot. Completely necrotic surface. We have been using silver collagen when he was last here the beginning of December he ran out of this a long time ago. I think he has been using Silvadene cream. He follows up with Dr. Fletcher Anon or Dr. Gwenlyn Found next week. He arrives in clinic as usual not wanting debridement unfortunately its debridement is what he needs 2/11; this area on the right foot looks somewhat better than when I saw this 2 weeks ago. I did receive a call from Dr. Gwenlyn Found who went over his PAD history. He had a directional atherectomy followed by drug coated balloon angioplasty of his right SFA on 07/16/2019 his right ABI increased from 0.6-0.85 at that point. He underwent a right external iliac artery stenting by Dr. Fletcher Anon on 12/19/2019 his right SFA was patent as was his peroneal. He did not have a patent posterior tibial at the level of the ankle the only patent vessel to his foot is the anterior tibial artery although that is fortunate in that that is where the or the major wound is. He was not felt to be a candidate for any further angiographic intervention 03/28/2020 upon evaluation today patient appears to be doing well with regard to his wound. Is been tolerating the dressing changes without complication. With that being said he has been using Santyl this definitely seems to be doing better than nothing is for breakdown some of the slough and biofilm on the surface of the wound. Fortunately there is no evidence of active infection which is great news. No fevers, chills,  nausea, vomiting, or diarrhea. 3/25; since the patient was last here I was called by Dr. Gwenlyn Found. They do not anticipate any more interventions at this point. I have listed his impressions on the right leg vasculature below " Mr. Goertz returns today for follow-up. He follows with Dr. Dellia Nims at the wound care clinic for a slowly healing wound on the dorsal aspect of his right foot. He has had 2 interventions on this, one in July 2021 where I revascularized an occluded right SFA. He  had one-vessel runoff. He did have spasm of his peroneal which ultimately was shown to have resolved repeat CT angiography. In November he had stenting of a calcified physiologically significant right external iliac artery. His anterior tibial artery does not reach past the ankle and therefore revascularization would be difficult. I would continue with aggressive local wound care. He also has claudication on the left. I attempted left SFA intervention in July but was unsuccessful in reentering the distal lumen. Unfortunately, he only has 1 patent tibial vessel on the left common anterior tibial, and no evidence of critical limb ischemia so I am hesitant to recommend tibial pedal access for a retrograde approach." He has been using Santyl on the right dorsal foot. Arrives with the wound above 0.5 cm in depth the surface does not look too bad. He is also complaining about pain in the left fifth toe without any antecedent trauma he is aware of. Says that the pain has been there for the last 5 or 6 weeks 4/15; the area on his right dorsal foot perhaps slightly better. We have been using Santyl. Dr. Kennon Holter last opinions on his vascular supply are listed in my note of 04/18/2020. Again he complains of pain in the left fifth toe. We ordered an x-ray of this almost 3 weeks ago. He did not get it done Objective Constitutional Sitting or standing Blood Pressure is within target range for patient.. Pulse regular and within target  range for patient.Marland Kitchen Respirations regular, non-labored and within target range.. Temperature is normal and within the target range for the patient.Marland Kitchen Appears in no distress. Vitals Time Taken: 2:38 PM, Height: 62 in, Weight: 130 lbs, BMI: 23.8, Temperature: 98.6 F, Pulse: 115 bpm, Respiratory Rate: 17 breaths/min, Blood Pressure: 108/68 mmHg. Cardiovascular Pedal pulses absent bilaterally. However his feet are warm especially on the right suggesting he has some perfusion. Musculoskeletal The patient has erythema and tenderness to below the PIP on the left fifth toe. This is the same as it was 3 weeks ago and probably represents some form of inflammation or arthritis although cannot rule out a fracture. General Notes: Wound exam; I did not debride the wound today. There there may be a combination of new skin and some debris I cannot really tell at this point. The granulation is pale but I think the area may have contracted. I really do not want to disturb this at this point. Integumentary (Hair, Skin) Wound #1 status is Open. Original cause of wound was Gradually Appeared. The date acquired was: 03/26/2019. The wound has been in treatment 38 weeks. The wound is located on the Right,Dorsal Foot. The wound measures 0.5cm length x 0.4cm width x 0.2cm depth; 0.157cm^2 area and 0.031cm^3 volume. There is tendon and Fat Layer (Subcutaneous Tissue) exposed. There is no tunneling or undermining noted. There is a medium amount of serosanguineous drainage noted. The wound margin is well defined and not attached to the wound base. There is large (67-100%) granulation within the wound bed. There is a small (1- 33%) amount of necrotic tissue within the wound bed. Assessment Active Problems ICD-10 Other specified peripheral vascular diseases Type 2 diabetes mellitus with foot ulcer Non-pressure chronic ulcer of other part of right foot with fat layer exposed Nicotine dependence, cigarettes, with other  nicotine-induced disorders Pain in left foot Procedures Wound #1 Pre-procedure diagnosis of Wound #1 is a Diabetic Wound/Ulcer of the Lower Extremity located on the Right,Dorsal Foot .Severity of Tissue Pre Debridement is: Fat layer exposed. There was  a Chemical/Enzymatic/Mechanical debridement performed by Ricard Dillon., MD.. Agent used was Santyl. A time out was conducted at 15:07, prior to the start of the procedure. There was no bleeding. The procedure was tolerated well. Post Debridement Measurements: 0.5cm length x 0.4cm width x 0.2cm depth; 0.031cm^3 volume. Character of Wound/Ulcer Post Debridement is stable. Severity of Tissue Post Debridement is: Fat layer exposed. Post procedure Diagnosis Wound #1: Same as Pre-Procedure Plan Follow-up Appointments: Return Appointment in 2 weeks. Bathing/ Shower/ Hygiene: May shower and wash wound with soap and water. - with dressing changes. Edema Control - Lymphedema / SCD / Other: Elevate legs to the level of the heart or above for 30 minutes daily and/or when sitting, a frequency of: - 3-4 times a throughout the day. Avoid standing for long periods of time. Off-Loading: Wound #1 Right,Dorsal Foot: Open toe surgical shoe to: - ensure no pressure to top of foot. Radiology ordered were: X-ray, foot:Left Foot - CPT [phone] : The following medication(s) was prescribed: Santyl topical 250 unit/gram ointment ointment topical to affected area daily starting 05/09/2020 WOUND #1: - Foot Wound Laterality: Dorsal, Right Cleanser: Soap and Water Every Other Day/30 Days Discharge Instructions: May shower and wash wound with dial antibacterial soap and water prior to dressing change. Prim Dressing: Santyl Ointment Every Other Day/30 Days ary Discharge Instructions: Apply nickel thick amount to wound bed as instructed Secondary Dressing: ComfortFoam Border, 4x4 in (silicone border) (Generic) Every Other Day/30 Days Discharge Instructions: Apply over  primary dressing as directed. 1. I continued the Santyl and E scribed him another prescription 2. I have written an x-ray of the left foot toe I did the same thing 3 weeks ago. I am doubtful this represents an infection. Incidental traumao Some form of inflammatory arthritis of the top thoughts. Electronic Signature(s) Signed: 05/09/2020 4:57:27 PM By: Linton Ham MD Entered By: Linton Ham on 05/09/2020 15:24:10 -------------------------------------------------------------------------------- SuperBill Details Patient Name: Date of Service: [name] RD, [name] 05/09/2020 Medical Record Number: [id number] Patient Account Number: [id number] Date of Birth/Sex: Treating RN: July 03, 1960 (60 y.o. Marcheta Grammes Primary Care Provider: Cipriano Mile Other Clinician: Referring Provider: Treating Provider/Extender: Laroy Apple Weeks in Treatment: 38 Diagnosis Coding ICD-10 Codes Code Description I73.89 Other specified peripheral vascular diseases E11.621 Type 2 diabetes mellitus with foot ulcer L97.512 Non-pressure chronic ulcer of other part of right foot with fat layer exposed F17.218 Nicotine dependence, cigarettes, with other nicotine-induced disorders M79.672 Pain in left foot Facility Procedures CPT4 Code: [id number] Description: [id number] - DEBRIDE W/O ANES NON SELECT ICD-10 Diagnosis Description E11.621 Type 2 diabetes mellitus with foot ulcer L97.512 Non-pressure chronic ulcer of other part of right foot with fat layer exposed Modifier: Quantity: 1 Physician Procedures : CPT4 Code Description Modifier [id number] - WC PHYS LEVEL 3 - EST PT ICD-10 Diagnosis Description L97.512 Non-pressure chronic ulcer of other part of right foot with fat layer exposed M79.672 Pain in left foot E11.621 Type 2 diabetes mellitus with  foot ulcer I73.89 Other specified peripheral vascular diseases Quantity: 1 Electronic Signature(s) Signed: 05/09/2020 4:57:27 PM By: Linton Ham MD Previous Signature: 05/09/2020 3:12:26 PM Version By: Lorrin Jackson Entered By: Linton Ham on 05/09/2020 15:24:40

## 2020-05-10 ENCOUNTER — Other Ambulatory Visit: Payer: Self-pay

## 2020-05-10 ENCOUNTER — Emergency Department (HOSPITAL_COMMUNITY): Payer: Medicare Other

## 2020-05-10 ENCOUNTER — Emergency Department (HOSPITAL_COMMUNITY)
Admission: EM | Admit: 2020-05-10 | Discharge: 2020-05-10 | Disposition: A | Discharge status: Home / Self Care | Payer: Medicare Other | Attending: Emergency Medicine | Admitting: Emergency Medicine

## 2020-05-10 DIAGNOSIS — Z20822 Contact with and (suspected) exposure to covid-19: Secondary | ICD-10-CM | POA: Insufficient documentation

## 2020-05-10 DIAGNOSIS — Z7902 Long term (current) use of antithrombotics/antiplatelets: Secondary | ICD-10-CM | POA: Insufficient documentation

## 2020-05-10 DIAGNOSIS — F17210 Nicotine dependence, cigarettes, uncomplicated: Secondary | ICD-10-CM | POA: Insufficient documentation

## 2020-05-10 DIAGNOSIS — Z7984 Long term (current) use of oral hypoglycemic drugs: Secondary | ICD-10-CM | POA: Diagnosis not present

## 2020-05-10 DIAGNOSIS — R1084 Generalized abdominal pain: Secondary | ICD-10-CM | POA: Diagnosis present

## 2020-05-10 DIAGNOSIS — I10 Essential (primary) hypertension: Secondary | ICD-10-CM | POA: Diagnosis not present

## 2020-05-10 DIAGNOSIS — Z79899 Other long term (current) drug therapy: Secondary | ICD-10-CM | POA: Diagnosis not present

## 2020-05-10 DIAGNOSIS — E119 Type 2 diabetes mellitus without complications: Secondary | ICD-10-CM | POA: Diagnosis not present

## 2020-05-10 DIAGNOSIS — Z7982 Long term (current) use of aspirin: Secondary | ICD-10-CM | POA: Insufficient documentation

## 2020-05-10 DIAGNOSIS — R112 Nausea with vomiting, unspecified: Secondary | ICD-10-CM | POA: Diagnosis not present

## 2020-05-10 LAB — CBC WITH DIFFERENTIAL/PLATELET
Abs Immature Granulocytes: 0.02 10*3/uL (ref 0.00–0.07)
Basophils Absolute: 0 10*3/uL (ref 0.0–0.1)
Basophils Relative: 0 %
Eosinophils Absolute: 0 10*3/uL (ref 0.0–0.5)
Eosinophils Relative: 0 %
HCT: 41.2 % (ref 39.0–52.0)
Hemoglobin: 13.5 g/dL (ref 13.0–17.0)
Immature Granulocytes: 0 %
Lymphocytes Relative: 20 %
Lymphs Abs: 1.1 10*3/uL (ref 0.7–4.0)
MCH: 29.4 pg (ref 26.0–34.0)
MCHC: 32.8 g/dL (ref 30.0–36.0)
MCV: 89.8 fL (ref 80.0–100.0)
Monocytes Absolute: 0.8 10*3/uL (ref 0.1–1.0)
Monocytes Relative: 15 %
Neutro Abs: 3.6 10*3/uL (ref 1.7–7.7)
Neutrophils Relative %: 65 %
Platelets: 312 10*3/uL (ref 150–400)
RBC: 4.59 MIL/uL (ref 4.22–5.81)
RDW: 15.9 % — ABNORMAL HIGH (ref 11.5–15.5)
WBC: 5.6 10*3/uL (ref 4.0–10.5)
nRBC: 0 % (ref 0.0–0.2)

## 2020-05-10 LAB — RESP PANEL BY RT-PCR (FLU A&B, COVID) ARPGX2
Influenza A by PCR: NEGATIVE
Influenza B by PCR: NEGATIVE
SARS Coronavirus 2 by RT PCR: NEGATIVE

## 2020-05-10 LAB — URINALYSIS, ROUTINE W REFLEX MICROSCOPIC
Bilirubin Urine: NEGATIVE
Glucose, UA: NEGATIVE mg/dL
Hgb urine dipstick: NEGATIVE
Ketones, ur: 5 mg/dL — AB
Leukocytes,Ua: NEGATIVE
Nitrite: NEGATIVE
Protein, ur: NEGATIVE mg/dL
Specific Gravity, Urine: 1.008 (ref 1.005–1.030)
pH: 8 (ref 5.0–8.0)

## 2020-05-10 LAB — COMPREHENSIVE METABOLIC PANEL
ALT: 17 U/L (ref 0–44)
AST: 21 U/L (ref 15–41)
Albumin: 4.3 g/dL (ref 3.5–5.0)
Alkaline Phosphatase: 65 U/L (ref 38–126)
Anion gap: 13 (ref 5–15)
BUN: 16 mg/dL (ref 6–20)
CO2: 30 mmol/L (ref 22–32)
Calcium: 9.9 mg/dL (ref 8.9–10.3)
Chloride: 88 mmol/L — ABNORMAL LOW (ref 98–111)
Creatinine, Ser: 1.22 mg/dL (ref 0.61–1.24)
GFR, Estimated: >60 mL/min (ref >60)
Glucose, Bld: 117 mg/dL — ABNORMAL HIGH (ref 70–99)
Potassium: 3.5 mmol/L (ref 3.5–5.1)
Sodium: 131 mmol/L — ABNORMAL LOW (ref 135–145)
Total Bilirubin: 0.4 mg/dL (ref 0.3–1.2)
Total Protein: 7.7 g/dL (ref 6.5–8.1)

## 2020-05-10 LAB — ETHANOL: Alcohol, Ethyl (B): <10 mg/dL (ref <10)

## 2020-05-10 LAB — LACTIC ACID, PLASMA: Lactic Acid, Venous: 1.2 mmol/L (ref 0.5–1.9)

## 2020-05-10 LAB — LIPASE, BLOOD: Lipase: 22 U/L (ref 11–51)

## 2020-05-10 MED ORDER — ONDANSETRON HCL 4 MG PO TABS
4.0000 mg | ORAL_TABLET | Freq: Four times a day (QID) | ORAL | 0 refills | Status: DC
Start: 2020-05-10 — End: 2020-06-16

## 2020-05-10 MED ORDER — ONDANSETRON HCL 4 MG/2ML IJ SOLN
4.0000 mg | Freq: Once | INTRAMUSCULAR | Status: AC
  Administered 2020-05-10: 4 mg via INTRAVENOUS
  Filled 2020-05-10: qty 2

## 2020-05-10 MED ORDER — SODIUM CHLORIDE 0.9 % IV BOLUS
500.0000 mL | Freq: Once | INTRAVENOUS | Status: AC
  Administered 2020-05-10: 500 mL via INTRAVENOUS

## 2020-05-10 MED ORDER — MORPHINE SULFATE (PF) 4 MG/ML IV SOLN
4.0000 mg | Freq: Once | INTRAVENOUS | Status: AC
  Administered 2020-05-10: 4 mg via INTRAVENOUS
  Filled 2020-05-10: qty 1

## 2020-05-10 MED ORDER — IOHEXOL 300 MG/ML  SOLN
100.0000 mL | Freq: Once | INTRAMUSCULAR | Status: AC | PRN
  Administered 2020-05-10: 100 mL via INTRAVENOUS

## 2020-05-10 NOTE — Discharge Instructions (Signed)
Please return for any problem.  °

## 2020-05-10 NOTE — ED Triage Notes (Signed)
Patient reports abd pain x 2 days with vomiting, patient was unable to give medical history or medication list. abd pain is midline lower quadrant. Patient has been constipated for two days. Ems states patient called 911 and said I dont feel good and walked to ambulance but unable to give history.

## 2020-05-10 NOTE — ED Notes (Addendum)
Discharged no concerns at this time. Ambulatory.

## 2020-05-10 NOTE — ED Provider Notes (Signed)
[facility] DEPT Provider Note   CSN: [id number] Arrival date & time: 05/10/20  1420     History Chief Complaint  Patient presents with  . Abdominal Pain    [name]. is a 60 y.o. male.  60 year old male with prior medical history as detailed below presents for evaluation.  Patient reports approximately 24 to 48 hours of abdominal discomfort.  This was associated with reported nausea and vomiting.  He denies diarrhea.  He denies fever.  The history is provided by the patient and medical records.  Abdominal Pain Pain location:  Generalized Pain quality: aching   Pain radiates to:  Does not radiate Pain severity:  Mild Onset quality:  Gradual Duration:  2 days Timing:  Constant Progression:  Waxing and waning Chronicity:  New Relieved by:  Nothing Worsened by:  Nothing      Past Medical History:  Diagnosis Date  . Diabetes mellitus without complication ([place])   . Hyperlipidemia   . Hypertension   . Peripheral vascular disease ([place])   . Tobacco use     Patient Active Problem List   Diagnosis Date Noted  . PVD (peripheral vascular disease) ([place]) 12/19/2019  . Claudication in peripheral vascular disease ([place]) 08/16/2019  . Critical ischemia of foot ([place]) 07/16/2019  . Critical lower limb ischemia ([place]) 07/10/2019  . Pure hypercholesterolemia 10/13/2018  . Type 2 diabetes mellitus without complication, without long-term current use of insulin ([place]) 10/13/2018  . Tobacco use disorder 10/13/2018  . Essential hypertension, benign 10/23/2009    Past Surgical History:  Procedure Laterality Date  . ABDOMINAL AORTOGRAM W/LOWER EXTREMITY Left 07/16/2019   Procedure: ABDOMINAL AORTOGRAM W/LOWER EXTREMITY;  Surgeon: Lorretta Harp, MD;  Location: [facility];  Service: Cardiovascular;  Laterality: Left;  . ABDOMINAL AORTOGRAM W/LOWER EXTREMITY Left 08/16/2019  . ABDOMINAL AORTOGRAM W/LOWER EXTREMITY N/A 08/16/2019    Procedure: ABDOMINAL AORTOGRAM W/LOWER EXTREMITY;  Surgeon: Lorretta Harp, MD;  Location: [facility];  Service: Cardiovascular;  Laterality: N/A;  . ABDOMINAL AORTOGRAM W/LOWER EXTREMITY Bilateral 12/19/2019   Procedure: ABDOMINAL AORTOGRAM W/LOWER EXTREMITY;  Surgeon: Wellington Hampshire, MD;  Location: [facility];  Service: Cardiovascular;  Laterality: Bilateral;  . arm surgery    . PERIPHERAL VASCULAR BALLOON ANGIOPLASTY  08/16/2019   Procedure: PERIPHERAL VASCULAR BALLOON ANGIOPLASTY;  Surgeon: Lorretta Harp, MD;  Location: [facility];  Service: Cardiovascular;;  attempted PTA of Left SFA  . PERIPHERAL VASCULAR INTERVENTION Right 07/16/2019   Procedure: PERIPHERAL VASCULAR INTERVENTION;  Surgeon: Lorretta Harp, MD;  Location: [facility];  Service: Cardiovascular;  Laterality: Right;  . PERIPHERAL VASCULAR INTERVENTION Right 12/19/2019   Procedure: PERIPHERAL VASCULAR INTERVENTION;  Surgeon: Wellington Hampshire, MD;  Location: [facility];  Service: Cardiovascular;  Laterality: Right;  iliac       Family History  Problem Relation Age of Onset  . Cancer Mother   . Diabetes Father     Social History   Tobacco Use  . Smoking status: Current Every Day Smoker    Packs/day: 0.50    Years: 40.00    Pack years: 20.00    Types: Cigarettes  . Smokeless tobacco: Never Used  Vaping Use  . Vaping Use: Never used  Substance Use Topics  . Alcohol use: Yes    Comment: beer and liquor daily  . Drug use: Yes    Frequency: 3.0 times per week    Types: Marijuana  Home Medications Prior to Admission medications   Medication Sig Start Date End Date Taking? Authorizing Provider  aspirin EC 81 MG EC tablet Take 1 tablet (81 mg total) by mouth daily. Swallow whole. Patient taking differently: Take 81 mg by mouth daily at 2 PM. Swallow whole. 07/18/19  Yes Furth, Cadence H, PA-C  atorvastatin (LIPITOR) 40 MG tablet Take 1 tablet by mouth at bedtime.  04/06/20  Yes [provider]  clopidogrel (PLAVIX) 75 MG tablet Take 1 tablet (75 mg total) by mouth daily with breakfast. 07/18/19  Yes Furth, Cadence H, PA-C  ezetimibe (ZETIA) 10 MG tablet Take 10 mg by mouth daily.   Yes [provider]  gabapentin (NEURONTIN) 600 MG tablet Take 600 mg by mouth 3 (three) times daily. 02/26/20  Yes [provider]  lisinopril-hydrochlorothiazide (ZESTORETIC) 20-25 MG tablet Take 1 tablet by mouth daily. 04/16/20  Yes [provider]  metFORMIN (GLUCOPHAGE-XR) 500 MG 24 hr tablet Take 500 mg by mouth daily. 02/26/20  Yes [provider]  ondansetron (ZOFRAN) 4 MG tablet Take 1 tablet (4 mg total) by mouth every 6 (six) hours. 05/10/20  Yes Valarie Merino, MD  SANTYL ointment Apply 1 application topically daily as needed (wound care).  08/15/19  Yes [provider]  SSD 1 % cream Apply 1 application topically daily as needed (wound care).  08/07/19  Yes [provider]  traMADol (ULTRAM) 50 MG tablet Take 50 mg by mouth 3 (three) times daily as needed (pain).  09/07/19  Yes [provider]  atorvastatin (LIPITOR) 80 MG tablet Take 1 tablet (80 mg total) by mouth daily. Patient not taking: Reported on 05/10/2020 07/18/19   Kathlen Mody, Cadence H, PA-C  Blood Pressure Monitor KIT Check blood pressure daily 10/20/18   Jacelyn Pi, Lilia Argue, MD  ezetimibe (ZETIA) 10 MG tablet Take 1 tablet (10 mg total) by mouth daily. 01/22/20 04/21/20  O'NealCassie Freer, MD    Allergies    Patient has no known allergies.  Review of Systems   Review of Systems  Gastrointestinal: Positive for abdominal pain.  All other systems reviewed and are negative.   Physical Exam Updated Vital Signs BP 135/77   Pulse 86   Temp 98.1 F (36.7 C) (Oral)   Resp 13   Ht '5\' 1"'  (1.549 m)   SpO2 99%   BMI 20.03 kg/m   Physical Exam Vitals and nursing note reviewed.  Constitutional:      General: He is not in acute  distress.    Appearance: He is well-developed.  HENT:     Head: Normocephalic and atraumatic.  Eyes:     Conjunctiva/sclera: Conjunctivae normal.     Pupils: Pupils are equal, round, and reactive to light.  Cardiovascular:     Rate and Rhythm: Normal rate and regular rhythm.     Heart sounds: Normal heart sounds.  Pulmonary:     Effort: Pulmonary effort is normal. No respiratory distress.     Breath sounds: Normal breath sounds.  Abdominal:     General: There is no distension.     Palpations: Abdomen is soft.     Tenderness: There is generalized abdominal tenderness.  Musculoskeletal:        General: No deformity. Normal range of motion.     Cervical back: Normal range of motion and neck supple.  Skin:    General: Skin is warm and dry.  Neurological:     Mental Status: He is alert and  oriented to person, place, and time.     ED Results / Procedures / Treatments   Labs (all labs ordered are listed, but only abnormal results are displayed) Labs Reviewed  COMPREHENSIVE METABOLIC PANEL - Abnormal; Notable for the following components:      Result Value   Sodium 131 (*)    Chloride 88 (*)    Glucose, Bld 117 (*)    All other components within normal limits  CBC WITH DIFFERENTIAL/PLATELET - Abnormal; Notable for the following components:   RDW 15.9 (*)    All other components within normal limits  URINALYSIS, ROUTINE W REFLEX MICROSCOPIC - Abnormal; Notable for the following components:   Color, Urine STRAW (*)    Ketones, ur 5 (*)    All other components within normal limits  RESP PANEL BY RT-PCR (FLU A&B, COVID) ARPGX2  LIPASE, BLOOD  LACTIC ACID, PLASMA  ETHANOL  LACTIC ACID, PLASMA    EKG EKG Interpretation  Date/Time:  Saturday May 10 2020 17:14:17 EDT Ventricular Rate:  74 PR Interval:  142 QRS Duration: 93 QT Interval:  412 QTC Calculation: 458 R Axis:   41 Text Interpretation: Sinus rhythm Biatrial enlargement Probable left ventricular hypertrophy  Anterior Q waves, possibly due to LVH Confirmed by Dene Gentry [phone]) on 05/10/2020 5:42:19 PM   Radiology CT ABDOMEN PELVIS W CONTRAST  Result Date: 05/10/2020 CLINICAL DATA:  Abdominal pain for 2 days EXAM: CT ABDOMEN AND PELVIS WITH CONTRAST TECHNIQUE: Multidetector CT imaging of the abdomen and pelvis was performed using the standard protocol following bolus administration of intravenous contrast. CONTRAST:  174m OMNIPAQUE IOHEXOL 300 MG/ML  SOLN COMPARISON:  08/17/2019 FINDINGS: Lower chest: Lung bases are free of acute infiltrate or sizable effusion. Large systemic branch from the abdominal aorta supplies a portion of the right lower lobe. No true sequestration is seen. This is stable from the prior exam Hepatobiliary: No focal liver abnormality is seen. No gallstones, gallbladder wall thickening, or biliary dilatation. Pancreas: Unremarkable. No pancreatic ductal dilatation or surrounding inflammatory changes. Spleen: Normal in size without focal abnormality. Adrenals/Urinary Tract: Adrenal glands are unremarkable. Kidneys demonstrate a normal enhancement pattern bilaterally. No renal calculi or obstructive changes are seen. Delayed images demonstrate normal excretion of contrast. The bladder is partially distended. Stomach/Bowel: No obstructive or inflammatory changes of the colon are seen. The appendix is well visualized and within normal limits. Small bowel and stomach appear unremarkable. Vascular/Lymphatic: Aortic atherosclerosis. No enlarged abdominal or pelvic lymph nodes. 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 Stenting in the common and external iliac artery on the right is noted and patent. Reproductive: Prostate is unremarkable. Other: No abdominal wall hernia or abnormality. No abdominopelvic ascites. Musculoskeletal: Degenerative changes of lumbar spine are noted. IMPRESSION: No acute abnormality is identified. Stable systemic supplied to a portion of the right lower  lobe lobe. 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 Electronically Signed   By: MInez CatalinaM.D.   On: 05/10/2020 19:52   DG Chest Port 1 View  Result Date: 05/10/2020 CLINICAL DATA:  Abdominal pain. Midline LOWER QUADRANT abdominal pain for 2 days. Vomiting. EXAM: PORTABLE CHEST 1 VIEW COMPARISON:  07/15/2008 FINDINGS: The heart size and mediastinal contours are within normal limits. Both lungs are clear. No free intraperitoneal air. The visualized skeletal structures are unremarkable. Probable bone island within the LEFT humeral head. Postoperative changes in the RIGHT. IMPRESSION: No active disease. Electronically Signed   By: ENolon NationsM.D.  On: 05/10/2020 18:17   DG Foot Complete Left  Result Date: 05/10/2020 CLINICAL DATA:  Left foot wound under 5th toe EXAM: LEFT FOOT - COMPLETE 3+ VIEW COMPARISON:  None. FINDINGS: No acute bony abnormality. Specifically, no fracture, subluxation, or dislocation. Early degenerative changes at the 1st carpometacarpal joint. No bone destruction to suggest osteomyelitis. IMPRESSION: No acute bony abnormality. Electronically Signed   By: Rolm Baptise M.D.   On: 05/10/2020 21:02    Procedures Procedures   Medications Ordered in ED Medications  sodium chloride 0.9 % bolus 500 mL (500 mLs Intravenous New Bag/Given 05/10/20 1802)  ondansetron (ZOFRAN) injection 4 mg (4 mg Intravenous Given 05/10/20 1803)  morphine 4 MG/ML injection 4 mg (4 mg Intravenous Given 05/10/20 1804)  iohexol (OMNIPAQUE) 300 MG/ML solution 100 mL (100 mLs Intravenous Contrast Given 05/10/20 1856)    ED Course  I have reviewed the triage vital signs and the nursing notes.  Pertinent labs & imaging results that were available during my care of the patient were reviewed by me and considered in my medical decision making (see chart for details).    MDM Rules/Calculators/A&P                          MDM  MSE complete  Gradyn Shein. was evaluated  in Emergency Department on 05/10/2020 for the symptoms described in the history of present illness. He was evaluated in the context of the global COVID-19 pandemic, which necessitated consideration that the patient might be at risk for infection with the SARS-CoV-2 virus that causes COVID-19. Institutional protocols and algorithms that pertain to the evaluation of patients at risk for COVID-19 are in a state of rapid change based on information released by regulatory bodies including the CDC and federal and state organizations. These policies and algorithms were followed during the patient's care in the ED.  Patient presented with complaint of abdominal discomfort and associated nausea and vomiting.  Exam was without significant tenderness noted to the abdomen.  Work-up including CT did not reveal significant acute abnormality.  Patient feels improved following IV fluids and antiemetics.  Repeat abdominal exam is benign.  As discharge was being prepared patient requested that we obtain x-rays of his left foot for his wound care physician.  Importance of close follow-up was stressed.  Strict return precautions given and understood. Final Clinical Impression(s) / ED Diagnoses Final diagnoses:  Generalized abdominal pain    Rx / DC Orders ED Discharge Orders         Ordered    ondansetron (ZOFRAN) 4 MG tablet  Every 6 hours        05/10/20 2120           Valarie Merino, MD 05/10/20 2124

## 2020-05-13 NOTE — Progress Notes (Signed)
[name], [name] ([id number]) Visit Report for 05/09/2020 Arrival Information Details Patient Name: Date of Service: [place] 05/09/2020 2:45 PM Medical Record Number: [id number] Patient Account Number: [id number] Date of Birth/Sex: Treating RN: 1960/03/08 (60 y.o. Burnadette Pop, Lauren Primary Care Jakub Debold: Cipriano Mile Other Clinician: Referring Demitria Hay: Treating Limmie Schoenberg/Extender: Laroy Apple Weeks in Treatment: 31 Visit Information History Since Last Visit Added or deleted any medications: No Patient Arrived: Ambulatory Any new allergies or adverse reactions: No Arrival Time: 14:35 Had a fall or experienced change in No Accompanied By: self activities of daily living that may affect Transfer Assistance: None risk of falls: Patient Identification Verified: Yes Signs or symptoms of abuse/neglect since last visito No Secondary Verification Process Completed: Yes Hospitalized since last visit: No Patient Requires Transmission-Based Precautions: No Implantable device outside of the clinic excluding No Patient Has Alerts: Yes cellular tissue based products placed in the center Patient Alerts: Patient on Blood Thinner since last visit: R ABI: 0.85 R TBI: 0.50 Has Dressing in Place as Prescribed: Yes Pain Present Now: Yes Electronic Signature(s) Signed: 05/09/2020 5:30:00 PM By: Rhae Hammock RN Entered By: Rhae Hammock on 05/09/2020 14:37:19 -------------------------------------------------------------------------------- Encounter Discharge Information Details Patient Name: Date of Service: [name], [name] 05/09/2020 2:45 PM Medical Record Number: [id number] Patient Account Number: [id number] Date of Birth/Sex: Treating RN: [birth year] (60 y.o. [name] Primary Care Kamryn Gauthier: Cipriano Mile Other Clinician: Referring Lilliane Sposito: Treating Jameelah Watts/Extender: Laroy Apple Weeks in Treatment: 57 Encounter  Discharge Information Items Post Procedure Vitals Discharge Condition: Stable Temperature (F): 98.6 Ambulatory Status: Ambulatory Pulse (bpm): 115 Discharge Destination: Home Respiratory Rate (breaths/min): 17 Transportation: Private Auto Blood Pressure (mmHg): 108/68 Accompanied By: self Schedule Follow-up Appointment: Yes Clinical Summary of Care: Electronic Signature(s) Signed: 05/09/2020 5:54:45 PM By: Deon Pilling Entered By: Deon Pilling on 05/09/2020 15:48:01 -------------------------------------------------------------------------------- Lower Extremity Assessment Details Patient Name: Date of Service: [name] 05/09/2020 2:45 PM Medical Record Number: [id number] Patient Account Number: [id number] Date of Birth/Sex: Treating RN: Oct 23, 1960 (60 y.o. Burnadette Pop, Lauren Primary Care Temika Sutphin: Cipriano Mile Other Clinician: Referring Shyasia Funches: Treating Male Iafrate/Extender: Laroy Apple Weeks in Treatment: 38 Edema Assessment Assessed: Shirlyn Goltz: No] Patrice Paradise: Yes] Edema: [Left: N] [Right: o] Calf Left: Right: Point of Measurement: 28 cm From Medial Instep 27 cm Ankle Left: Right: Point of Measurement: 9 cm From Medial Instep 18 cm Vascular Assessment Pulses: Dorsalis Pedis Palpable: [Right:Yes] Posterior Tibial Palpable: [Right:Yes] Electronic Signature(s) Signed: 05/09/2020 5:30:00 PM By: Rhae Hammock RN Entered By: Rhae Hammock on 05/09/2020 14:43:19 -------------------------------------------------------------------------------- Multi Wound Chart Details Patient Name: Date of Service: [name] 05/09/2020 2:45 PM Medical Record Number: [id number] Patient Account Number: [id number] Date of Birth/Sex: Treating RN: [birth year] (60 y.o. 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] [N/A:N/A] Secondary Etiology: [1:Hypertension, Peripheral Arterial] [N/A:N/A] Comorbid History: [1:Disease, Type II Diabetes, Neuropathy 03/26/2019] [N/A:N/A] Date Acquired: [1:38] [N/A:N/A] Weeks of Treatment: [1:Open] [N/A:N/A] Wound Status: [1:0.5x0.4x0.2] [N/A:N/A] Measurements L x W x D (cm) [1:0.157] [N/A:N/A] A (cm) : rea [1:0.031] [N/A:N/A] Volume (cm) : [1:94.40%] [N/A:N/A] % Reduction in A [1:rea: 89.00%] [  N/A:N/A] % Reduction in Volume: [1:Grade 2] [N/A:N/A] Classification: [1:Medium] [N/A:N/A] Exudate A mount: [1:Serosanguineous] [N/A:N/A] Exudate Type: [1:red, brown] [N/A:N/A] Exudate Color: [1:Well defined, not attached] [N/A:N/A] Wound Margin: [1:Large (67-100%)] [N/A:N/A] Granulation A mount: [1:Small (1-33%)] [N/A:N/A] Necrotic A mount: [1:Fat Layer (Subcutaneous Tissue): Yes N/A] Exposed Structures: [1:Tendon: Yes Fascia: No Muscle: No Joint: No Bone: No Small (1-33%)] [N/A:N/A] Epithelialization: [1:Chemical/Enzymatic/Mechanical] [N/A:N/A] Debridement: Pre-procedure Verification/Time Out 15:07 [N/A:N/A] Taken: [1:N/A] [N/A:N/A] Instrument: [1:None] [N/A:N/A] Bleeding: [1:Procedure was tolerated well] [N/A:N/A] Debridement Treatment Response: [1:0.5x0.4x0.2] [N/A:N/A] Post Debridement Measurements L x W x D (cm) [1:0.031] [N/A:N/A] Post Debridement Volume: (cm) [1:Debridement] [N/A:N/A] Treatment Notes Electronic Signature(s) Signed: 05/09/2020 4:57:27 PM By: Linton Ham MD Signed: 05/09/2020 5:21:53 PM By: Lorrin Jackson Entered By: Linton Ham on 05/09/2020  15:20:09 -------------------------------------------------------------------------------- Multi-Disciplinary Care Plan Details Patient Name: Date of Service: [name], [name] 05/09/2020 2:45 PM Medical Record Number: [id number] Patient Account Number: [id number] Date of Birth/Sex: Treating RN: 1960/09/02 (60 y.o. Marcheta Grammes Primary Care Daivion Pape: Cipriano Mile Other Clinician: Referring Aftin Lye: Treating Belita Warsame/Extender: Laroy Apple Weeks in Treatment: 38 Multidisciplinary Care Plan reviewed with physician Active Inactive Nutrition Nursing Diagnoses: Impaired glucose control: actual or potential Potential for alteratiion in Nutrition/Potential for imbalanced nutrition Goals: Patient/caregiver will maintain therapeutic glucose control Date Initiated: 08/15/2019 Target Resolution Date: 05/09/2020 Goal Status: Active Interventions: Assess HgA1c results as ordered upon admission and as needed Assess patient nutrition upon admission and as needed per policy Treatment Activities: Patient referred to Primary Care Physician for further nutritional evaluation : 08/15/2019 Notes: 04/18/20: Glucose control ongoing. Wound/Skin Impairment Nursing Diagnoses: Impaired tissue integrity Knowledge deficit related to smoking impact on wound healing Knowledge deficit related to ulceration/compromised skin integrity Goals: Patient will demonstrate a reduced rate of smoking or cessation of smoking Date Initiated: 08/15/2019 Target Resolution Date: 06/06/2020 Goal Status: Active Patient/caregiver will verbalize understanding of skin care regimen Date Initiated: 08/15/2019 Target Resolution Date: 06/06/2020 Goal Status: Active Ulcer/skin breakdown will have a volume reduction of 30% by week 4 Date Initiated: 08/15/2019 Date Inactivated: 09/21/2019 Target Resolution Date: 09/12/2019 Goal Status: Met Interventions: Assess patient/caregiver ability to obtain necessary  supplies Assess patient/caregiver ability to perform ulcer/skin care regimen upon admission and as needed Assess ulceration(s) every visit Provide education on ulcer and skin care Treatment Activities: Skin care regimen initiated : 08/15/2019 Topical wound management initiated : 08/15/2019 Notes: 05/09/20: Wound care compliance has been an issue, not following up with Xray and other orders. Electronic Signature(s) Signed: 05/09/2020 3:02:52 PM By: Lorrin Jackson Entered By: Lorrin Jackson on 05/09/2020 15:02:52 -------------------------------------------------------------------------------- Pain Assessment Details Patient Name: Date of Service: [name] 05/09/2020 2:45 PM Medical Record Number: [id number] Patient Account Number: [id number] Date of Birth/Sex: Treating RN: 1960/04/30 (60 y.o. Erie Noe Primary Care Tymere Depuy: Cipriano Mile Other Clinician: Referring Levester Waldridge: Treating Brantlee Penn/Extender: Laroy Apple Weeks in Treatment: 38 Active Problems Location of Pain Severity and Description of Pain Patient Has Paino Yes Site Locations Pain Location: Pain Location: Pain in Ulcers With Dressing Change: Yes Duration of the Pain. Constant / Intermittento Constant Rate the pain. Current Pain Level: 8 Worst Pain Level: 10 Least Pain Level: 0 Tolerable Pain Level: 8 Character of Pain Describe the Pain: Aching Pain Management and Medication Current Pain Management: Medication: Yes Cold Application: No Rest: Yes Massage: No Activity: No T.E.N.S.: No Heat Application: No Leg drop or elevation: No Is the Current Pain Management Adequate: Adequate How does your wound impact your activities of daily  livingo Sleep: Yes Bathing: No Appetite: Yes Relationship With Others: No Bladder Continence: No Emotions: No Bowel Continence: No Work: No Toileting: No Drive: No Dressing: No Hobbies: No Electronic Signature(s) Signed: 05/09/2020  5:30:00 PM By: Rhae Hammock RN Entered By: Rhae Hammock on 05/09/2020 14:37:49 -------------------------------------------------------------------------------- Patient/Caregiver Education Details Patient Name: Date of Service: [name] 4/15/2022andnbsp2:45 PM Medical Record Number: [id number] Patient Account Number: [id number] Date of Birth/Gender: Treating RN: [birth year] (60 y.o. Marcheta Grammes Primary Care Physician: Cipriano Mile Other Clinician: Referring Physician: Treating Physician/Extender: Laroy Apple Weeks in Treatment: 85 Education Assessment Education Provided To: Patient Education Topics Provided Smoking and Wound Healing: Methods: Explain/Verbal Responses: State content correctly Wound/Skin Impairment: Methods: Explain/Verbal, Printed Responses: State content correctly Electronic Signature(s) Signed: 05/09/2020 5:21:53 PM By: Lorrin Jackson Entered By: Lorrin Jackson on 05/09/2020 15:08:29 -------------------------------------------------------------------------------- Wound Assessment Details Patient Name: Date of Service: [name] 05/09/2020 2:45 PM Medical Record Number: [id number] Patient Account Number: [id number] Date of Birth/Sex: Treating RN: 12-25-1960 (60 y.o. Burnadette Pop, Lauren Primary Care Wise Fees: Cipriano Mile Other Clinician: Referring Otilio Groleau: Treating Maille Halliwell/Extender: Laroy Apple Weeks in Treatment: 38 Wound Status Wound Number: 1 Primary Diabetic Wound/Ulcer of the Lower Extremity Etiology: Wound Location: Right, Dorsal Foot Secondary Arterial Insufficiency Ulcer Wounding Event: Gradually Appeared Etiology: Date Acquired: 03/26/2019 Wound Status: Open Weeks Of Treatment: 38 Comorbid Hypertension, Peripheral Arterial Disease, Type II Diabetes, Clustered Wound: No History: Neuropathy Photos Wound Measurements Length: (cm) 0.5 Width: (cm) 0.4 Depth: (cm)  0.2 Area: (cm) 0.157 Volume: (cm) 0.031 % Reduction in Area: 94.4% % Reduction in Volume: 89% Epithelialization: Small (1-33%) Tunneling: No Undermining: No Wound Description Classification: Grade 2 Wound Margin: Well defined, not attached Exudate Amount: Medium Exudate Type: Serosanguineous Exudate Color: red, brown Foul Odor After Cleansing: No Slough/Fibrino Yes Wound Bed Granulation Amount: Large (67-100%) Exposed Structure Necrotic Amount: Small (1-33%) Fascia Exposed: No Fat Layer (Subcutaneous Tissue) Exposed: Yes Tendon Exposed: Yes Muscle Exposed: No Joint Exposed: No Bone Exposed: No Treatment Notes Wound #1 (Foot) Wound Laterality: Dorsal, Right Cleanser Soap and Water Discharge Instruction: May shower and wash wound with dial antibacterial soap and water prior to dressing change. Peri-Wound Care Topical Primary Dressing Santyl Ointment Discharge Instruction: Apply nickel thick amount to wound bed as instructed Secondary Dressing ComfortFoam Border, 4x4 in (silicone border) Discharge Instruction: Apply over primary dressing as directed. Secured With Compression Wrap Compression Stockings Environmental education officer) Signed: 05/09/2020 5:30:00 PM By: Rhae Hammock RN Signed: 05/13/2020 8:07:32 AM By: Sandre Kitty Entered By: Sandre Kitty on 05/09/2020 16:44:04 -------------------------------------------------------------------------------- Vitals Details Patient Name: Date of Service: [name], [name] 05/09/2020 2:45 PM Medical Record Number: [id number] Patient Account Number: [id number] Date of Birth/Sex: Treating RN: May 15, 1960 (60 y.o. Burnadette Pop, Lauren Primary Care Eileen Kangas: Cipriano Mile Other Clinician: Referring Chiyeko Ferre: Treating Gaven Eugene/Extender: Laroy Apple Weeks in Treatment: 38 Vital Signs Time Taken: 14:38 Temperature (F): 98.6 Height (in): 62 Pulse (bpm): 115 Weight (lbs):  130 Respiratory Rate (breaths/min): 17 Body Mass Index (BMI): 23.8 Blood Pressure (mmHg): 108/68 Reference Range: 80 - 120 mg / dl Electronic Signature(s) Signed: 05/09/2020 5:30:00 PM By: Rhae Hammock RN Entered By: Rhae Hammock on 05/09/2020 14:38:29

## 2020-05-23 ENCOUNTER — Other Ambulatory Visit: Payer: Self-pay

## 2020-05-23 ENCOUNTER — Encounter (HOSPITAL_BASED_OUTPATIENT_CLINIC_OR_DEPARTMENT_OTHER): Payer: Medicare Other | Admitting: Internal Medicine

## 2020-05-23 DIAGNOSIS — L97512 Non-pressure chronic ulcer of other part of right foot with fat layer exposed: Secondary | ICD-10-CM | POA: Diagnosis not present

## 2020-05-23 DIAGNOSIS — E11621 Type 2 diabetes mellitus with foot ulcer: Secondary | ICD-10-CM | POA: Diagnosis not present

## 2020-05-23 DIAGNOSIS — I7389 Other specified peripheral vascular diseases: Secondary | ICD-10-CM | POA: Diagnosis not present

## 2020-05-23 DIAGNOSIS — M79672 Pain in left foot: Secondary | ICD-10-CM

## 2020-05-23 NOTE — Progress Notes (Signed)
FINN, AMOS ([id number]) Visit Report for 05/23/2020 Arrival Information Details Patient Name: Date of Service: [place] 05/23/2020 2:00 PM Medical Record Number: [id number] Patient Account Number: [id number] Date of Birth/Sex: Treating RN: 1961-01-14 (60 y.o. Marcheta Grammes Primary Care Oron Westrup: Cipriano Mile Other Clinician: Referring Mivaan Corbitt: Treating Jakavion Bilodeau/Extender: Phineas Douglas Weeks in Treatment: 56 Visit Information History Since Last Visit Added or deleted any medications: No Patient Arrived: Ambulatory Any new allergies or adverse reactions: No Arrival Time: 14:21 Had a fall or experienced change in No Accompanied By: self activities of daily living that may affect Transfer Assistance: None risk of falls: Patient Identification Verified: Yes Signs or symptoms of abuse/neglect since last visito No Secondary Verification Process Completed: Yes Hospitalized since last visit: No Patient Requires Transmission-Based Precautions: No Implantable device outside of the clinic excluding No Patient Has Alerts: Yes cellular tissue based products placed in the center Patient Alerts: Patient on Blood Thinner since last visit: R ABI: 0.85 R TBI: 0.50 Has Dressing in Place as Prescribed: Yes Pain Present Now: Yes Electronic Signature(s) Signed: 05/23/2020 4:35:52 PM By: Sandre Kitty Entered By: Sandre Kitty on 05/23/2020 14:21:22 -------------------------------------------------------------------------------- Encounter Discharge Information Details Patient Name: Date of Service: [name], [place]. 05/23/2020 2:00 PM Medical Record Number: [id number] Patient Account Number: [id number] Date of Birth/Sex: Treating RN: 09-10-1960 (60 y.o. [name] Primary Care Sandor Arboleda: Cipriano Mile Other Clinician: Referring Dory Verdun: Treating Starr Urias/Extender: Phineas Douglas Weeks in Treatment: 43 Encounter Discharge  Information Items Post Procedure Vitals Discharge Condition: Stable Temperature (F): 98.3 Ambulatory Status: Ambulatory Pulse (bpm): 101 Discharge Destination: Home Respiratory Rate (breaths/min): 17 Transportation: Private Auto Blood Pressure (mmHg): 164/82 Accompanied By: self Schedule Follow-up Appointment: Yes Clinical Summary of Care: Electronic Signature(s) Signed: 05/23/2020 5:30:47 PM By: Deon Pilling Entered By: Deon Pilling on 05/23/2020 17:20:32 -------------------------------------------------------------------------------- Lower Extremity Assessment Details Patient Name: Date of Service: [name] 05/23/2020 2:00 PM Medical Record Number: [id number] Patient Account Number: [id number] Date of Birth/Sex: Treating RN: 1960-10-10 (60 y.o. [name] Primary Care Sailor Haughn: Cipriano Mile Other Clinician: Referring Rishard Delange: Treating Ambrosio Reuter/Extender: Phineas Douglas Weeks in Treatment: 40 Edema Assessment Assessed: [Left: No] [Right: No] Edema: [Left: No] [Right: No] Calf Left: Right: Point of Measurement: 28 cm From Medial Instep 27 cm Ankle Left: Right: Point of Measurement: 9 cm From Medial Instep 18 cm Vascular Assessment Pulses: Dorsalis Pedis Palpable: [Left:No] [Right:No] Electronic Signature(s) Signed: 05/23/2020 5:21:08 PM By: Baruch Gouty RN, BSN Entered By: Baruch Gouty on 05/23/2020 14:32:00 -------------------------------------------------------------------------------- Multi Wound Chart Details Patient Name: Date of Service: [name], [name] 05/23/2020 2:00 PM Medical Record Number: [id number] Patient Account Number: [id number] Date of Birth/Sex: Treating RN: 02-14-1960 (60 y.o. 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] [N/A:N/A] Secondary Etiology: [1:Hypertension, Peripheral Arterial] [N/A:N/A] Comorbid History: [1:Disease, Type II Diabetes, Neuropathy 03/26/2019] [N/A:N/A] Date Acquired: [1:40] [N/A:N/A] Weeks of Treatment: [1:Open] [N/A:N/A] Wound Status: [1:0.6x0.5x0.2] [N/A:N/A] Measurements L x W x D (cm) [1:0.236] [N/A:N/A] A (cm) : rea [1:0.047] [N/A:N/A] Volume (cm) : [1:91.70%] [N/A:N/A] % Reduction in A rea: [1:83.40%] [N/A:N/A] % Reduction  in Volume: [1:Grade 2] [N/A:N/A] Classification: [1:Small] [N/A:N/A] Exudate A mount: [1:Serosanguineous] [N/A:N/A] Exudate Type: [1:red, brown] [N/A:N/A] Exudate Color: [1:Distinct, outline attached] [N/A:N/A] Wound Margin: [1:Large (67-100%)] [N/A:N/A] Granulation A mount: [1:Red] [N/A:N/A] Granulation Quality: [1:None Present (0%)] [N/A:N/A] Necrotic A mount: [1:Fat Layer (Subcutaneous Tissue): Yes N/A] Exposed Structures: [1:Fascia: No Tendon: No Muscle: No Joint: No Bone: No Small (1-33%)] [N/A:N/A] Epithelialization: [1:Debridement - Excisional] [N/A:N/A] Debridement: Pre-procedure Verification/Time Out 15:19 [N/A:N/A] Taken: [1:Lidocaine 4% Topical Solution] [N/A:N/A] Pain Control: [1:Subcutaneous] [N/A:N/A] Tissue Debrided: [1:Skin/Subcutaneous Tissue] [N/A:N/A] Level: [1:0.3] [N/A:N/A] Debridement A (sq cm): [1:rea Curette] [N/A:N/A] Instrument: [1:Minimum] [N/A:N/A] Bleeding: [1:Pressure] [N/A:N/A] Hemostasis A chieved: [1:Procedure was tolerated well] [N/A:N/A] Debridement Treatment Response: [1:0.6x0.5x0.2] [N/A:N/A] Post Debridement Measurements L x W x D (cm) [1:0.047]  [N/A:N/A] Post Debridement Volume: (cm) [1:Debridement] [N/A:N/A] Treatment Notes Electronic Signature(s) Signed: 05/23/2020 3:41:02 PM By: Kalman Shan DO Signed: 05/23/2020 5:13:52 PM By: Lorrin Jackson Entered By: Kalman Shan on 05/23/2020 15:35:48 -------------------------------------------------------------------------------- Multi-Disciplinary Care Plan Details Patient Name: Date of Service: [name], [name] 05/23/2020 2:00 PM Medical Record Number: [id number] Patient Account Number: [id number] Date of Birth/Sex: Treating RN: 21-Nov-1960 (60 y.o. Marcheta Grammes Primary Care Lynett Brasil: Cipriano Mile Other Clinician: Referring Dorcus Riga: Treating Cherisa Brucker/Extender: Phineas Douglas Weeks in Treatment: [place] reviewed with physician Active Inactive Nutrition Nursing Diagnoses: Impaired glucose control: actual or potential Potential for alteratiion in Nutrition/Potential for imbalanced nutrition Goals: Patient/caregiver will maintain therapeutic glucose control Date Initiated: 08/15/2019 Target Resolution Date: 06/20/2020 Goal Status: Active Interventions: Assess HgA1c results as ordered upon admission and as needed Assess patient nutrition upon admission and as needed per policy Treatment Activities: Patient referred to Primary Care Physician for further nutritional evaluation : 08/15/2019 Notes: 04/18/20: Glucose control ongoing. 05/23/20: Glucose control ongoing, target date extended. Wound/Skin Impairment Nursing Diagnoses: Impaired tissue integrity Knowledge deficit related to smoking impact on wound healing Knowledge deficit related to ulceration/compromised skin integrity Goals: Patient will demonstrate a reduced rate of smoking or cessation of smoking Date Initiated: 08/15/2019 Target Resolution Date: 06/06/2020 Goal Status: Active Patient/caregiver will verbalize understanding of skin care regimen Date Initiated:  08/15/2019 Target Resolution Date: 06/06/2020 Goal Status: Active Ulcer/skin breakdown will have a volume reduction of 30% by week 4 Date Initiated: 08/15/2019 Date Inactivated: 09/21/2019 Target Resolution Date: 09/12/2019 Goal Status: Met Interventions: Assess patient/caregiver ability to obtain necessary supplies Assess patient/caregiver ability to perform ulcer/skin care regimen upon admission and as needed Assess ulceration(s) every visit Provide education on ulcer and skin care Treatment Activities: Skin care regimen initiated : 08/15/2019 Topical wound management initiated : 08/15/2019 Notes: 05/09/20: Wound care compliance has been an issue, not following up with Xray and other orders. Electronic Signature(s) Signed: 05/23/2020 2:47:43 PM By: Lorrin Jackson Entered By: Lorrin Jackson on 05/23/2020 14:47:43 -------------------------------------------------------------------------------- Pain Assessment Details Patient Name: Date of Service: [name] 05/23/2020 2:00 PM Medical Record Number: [id number] Patient Account Number: [id number] Date of Birth/Sex: Treating RN: [birth year] (60 y.o. Marcheta Grammes Primary Care Marysa Wessner: Cipriano Mile Other Clinician: Referring Tobechukwu Emmick: Treating Atzel Mccambridge/Extender: Phineas Douglas Weeks in Treatment: 19 Active Problems Location of Pain Severity and Description of Pain Patient Has Paino Yes Site Locations Rate the pain. Rate the pain. Current Pain Level: 10 Pain Management and Medication Current Pain Management: Electronic Signature(s) Signed: 05/23/2020 4:35:52 PM By: Sandre Kitty Signed: 05/23/2020 5:13:52 PM By: Lorrin Jackson Entered By: Sandre Kitty on 05/23/2020 14:22:08 -------------------------------------------------------------------------------- Patient/Caregiver Education Details Patient Name: Date of Service: CLINA RD, [place]  Warrick Parisian 4/29/2022andnbsp2:00 PM Medical Record Number:  [id number] Patient Account Number: [id number] Date of Birth/Gender: Treating RN: [birth year] (60 y.o. Marcheta Grammes Primary Care Physician: Cipriano Mile Other Clinician: Referring Physician: Treating Physician/Extender: Phineas Douglas Weeks in Treatment: 7 Education Assessment Education Provided To: Patient Education Topics Provided Smoking and Wound Healing: Methods: Explain/Verbal Responses: State content correctly Wound/Skin Impairment: Methods: Demonstration, Explain/Verbal, Printed Responses: State content correctly Electronic Signature(s) Signed: 05/23/2020 5:13:52 PM By: Lorrin Jackson Entered By: Lorrin Jackson on 05/23/2020 14:48:24 -------------------------------------------------------------------------------- Wound Assessment Details Patient Name: Date of Service: [name] 05/23/2020 2:00 PM Medical Record Number: [id number] Patient Account Number: [id number] Date of Birth/Sex: Treating RN: 1960-01-27 (61 y.o. [name] Primary Care Xzavien Harada: Cipriano Mile Other Clinician: Referring Felisha Claytor: Treating Harlynn Kimbell/Extender: Phineas Douglas Weeks in Treatment: 40 Wound Status Wound Number: 1 Primary Diabetic Wound/Ulcer of the Lower Extremity Etiology: Wound Location: Right, Dorsal Foot Secondary Arterial Insufficiency Ulcer Wounding Event: Gradually Appeared Etiology: Date Acquired: 03/26/2019 Wound Status: Open Weeks Of Treatment: 40 Comorbid Hypertension, Peripheral Arterial Disease, Type II Diabetes, Clustered Wound: No History: Neuropathy Photos Wound Measurements Length: (cm) 0.6 Width: (cm) 0.5 Depth: (cm) 0.2 Area: (cm) 0.236 Volume: (cm) 0.047 % Reduction in Area: 91.7% % Reduction in Volume: 83.4% Epithelialization: Small (1-33%) Tunneling: No Undermining: No Wound Description Classification: Grade 2 Wound Margin: Distinct, outline attached Exudate Amount: Small Exudate Type:  Serosanguineous Exudate Color: red, brown Foul Odor After Cleansing: No Slough/Fibrino Yes Wound Bed Granulation Amount: Large (67-100%) Exposed Structure Granulation Quality: Red Fascia Exposed: No Necrotic Amount: None Present (0%) Fat Layer (Subcutaneous Tissue) Exposed: Yes Tendon Exposed: No Muscle Exposed: No Joint Exposed: No Bone Exposed: No Treatment Notes Wound #1 (Foot) Wound Laterality: Dorsal, Right Cleanser Soap and Water Discharge Instruction: May shower and wash wound with dial antibacterial soap and water prior to dressing change. Peri-Wound Care Topical Primary Dressing Santyl Ointment Discharge Instruction: Apply nickel thick amount to wound bed as instructed Secondary Dressing ComfortFoam Border, 4x4 in (silicone border) Discharge Instruction: Apply over primary dressing as directed. Secured With Compression Wrap Compression Stockings Environmental education officer) Signed: 05/23/2020 4:35:52 PM By: Sandre Kitty Signed: 05/23/2020 5:21:08 PM By: Baruch Gouty RN, BSN Entered By: Sandre Kitty on 05/23/2020 16:18:00 -------------------------------------------------------------------------------- Vitals Details Patient Name: Date of Service: [name], [name] 05/23/2020 2:00 PM Medical Record Number: [id number] Patient Account Number: [id number] Date of Birth/Sex: Treating RN: 14-Jul-1960 (60 y.o. Marcheta Grammes Primary Care Jaceon Heiberger: Cipriano Mile Other Clinician: Referring Roseann Kees: Treating Alvey Brockel/Extender: Phineas Douglas Weeks in Treatment: 40 Vital Signs Time Taken: 14:21 Temperature (F): 98.3 Height (in): 62 Pulse (bpm): 101 Weight (lbs): 130 Respiratory Rate (breaths/min): 17 Body Mass Index (BMI): 23.8 Blood Pressure (mmHg): 164/82 Reference Range: 80 - 120 mg / dl Electronic Signature(s) Signed: 05/23/2020 4:35:52 PM By: Sandre Kitty Entered By: Sandre Kitty on 05/23/2020 14:21:50

## 2020-05-23 NOTE — Progress Notes (Signed)
ZATHAN, BERTH ([id number]) Visit Report for 05/23/2020 Chief Complaint Document Details Patient Name: Date of Service: [name] 05/23/2020 2:00 PM Medical Record Number: [id number] Patient Account Number: [id number] Date of Birth/Sex: Treating RN: [birth year] (60 y.o. Marcheta Grammes Primary Care Provider: Cipriano Mile Other Clinician: Referring Provider: Treating Provider/Extender: Phineas Douglas Weeks in Treatment: 24 Information Obtained from: Patient Chief Complaint Right foot ulcers Electronic Signature(s) Signed: 05/23/2020 3:41:02 PM By: Kalman Shan DO Entered By: Kalman Shan on 05/23/2020 15:35:58 -------------------------------------------------------------------------------- Debridement Details Patient Name: Date of Service: [name], [name] 05/23/2020 2:00 PM Medical Record Number: [id number] Patient Account Number: [id number] Date of Birth/Sex: Treating RN: [birth year] (60 y.o. Marcheta Grammes Primary Care Provider: Cipriano Mile Other Clinician: Referring Provider: Treating Provider/Extender: Phineas Douglas Weeks in Treatment: 40 Debridement Performed for Assessment: Wound #1 Right,Dorsal Foot Performed By: Physician Kalman Shan, DO Debridement Type: Debridement Severity of Tissue Pre Debridement: Fat layer exposed Level of Consciousness (Pre-procedure): Awake and Alert Pre-procedure Verification/Time Out Yes - 15:19 Taken: Start Time: 15:20 Pain Control: Lidocaine 4% T opical Solution T Area Debrided (L x W): otal 0.6 (cm) x 0.5 (cm) = 0.3 (cm) Tissue and other material debrided: Non-Viable, Subcutaneous Level: Skin/Subcutaneous Tissue Debridement Description: Excisional Instrument: Curette Bleeding: Minimum Hemostasis Achieved: Pressure End Time: 15:26 Response to Treatment: Procedure was tolerated well Level of Consciousness (Post- Awake and Alert procedure): Post Debridement  Measurements of Total Wound Length: (cm) 0.6 Width: (cm) 0.5 Depth: (cm) 0.2 Volume: (cm) 0.047 Character of Wound/Ulcer Post Debridement: Stable Severity of Tissue Post Debridement: Fat layer exposed Post Procedure Diagnosis Same as Pre-procedure Electronic Signature(s) Signed: 05/23/2020 3:41:02 PM By: Kalman Shan DO Signed: 05/23/2020 5:13:52 PM By: Lorrin Jackson Entered By: Lorrin Jackson on 05/23/2020 15:29:31 -------------------------------------------------------------------------------- HPI Details Patient Name: Date of Service: [name], [name] 05/23/2020 2:00 PM Medical Record Number: [id number] Patient Account Number: [id number] Date of Birth/Sex: Treating RN: 1960/04/13 (60 y.o. Marcheta Grammes Primary Care Provider: Cipriano Mile Other Clinician: Referring Provider: Treating Provider/Extender: Phineas Douglas Weeks in Treatment: 40 History of Present Illness HPI Description: 08/15/2019 upon evaluation today patient presents for evaluation here in our clinic concerning issues that he is having with wounds on the dorsal surface of his right foot. This is something that was noted to occur after he was obtaining cortisone injections with [facility]. Subsequently he developed ulcerations he was then referred to Dr. Alvester Chou who performed arterial testing and subsequently found that the patient had poor arterial flow with an ABI of 0.6 and a TBI of 0.26. With that being said he has since on 07/16/2019 had an angiogram on the right with improvement of his ABI to 0.85 and TBI to 0.5. Obviously this is not perfect but is definitely far superior to where it was previous. He does have some necrotic tissue on the surface of the wounds is going to need debriding away he has been using Silvadene on this but to be honest that is not can to do anything for these wounds. He needs something more to clear this away sharp debridement is ideal and we may even look  towards Santyl as well. The patient does have a history of diabetes noted in his chart though is not on medication and his A1c was around 6.4 so is not terrible. With that being said he does have known peripheral vascular disease he is actually have an angiogram on the left tomorrow. He  also has hypertension and he does smoke. 8/27; this is a patient has been here once before about 6 weeks ago. Since then he was hospitalized from 7/22 through 7/23 by Dr. Gwenlyn Found for an attempt at revascularization. He had previously been seen by Dr. Amalia Hailey of podiatry. He is a continued tobacco smoker. His angiogram on 6/21 via revealed a occluded SFAs bilaterally with one-vessel runoff. He had a atherectomy followed by a drug-coated balloon angioplasty of the right SFA. 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 He had some improvement in his right ABI from 0.6-0.85. There is also an attempt to open his left SFA but that was unsuccessful. He does not have a wound on the left side. We are using Santyl on the patient's wounds on the right dorsal foot. He says he has about a 1 minute exercise tolerance before stopping because of pain compatible with claudication 9/10; we are using Santyl on the wound on the right dorsal foot. Still has significant claudication symptoms. He is status post angioplasty of the right SFA and atherectomy. He has one-vessel runoff via a diseased peroneal. He follows up with Dr. Gwenlyn Found on 9/14 9/17; I had my notes from last week that the patient was to see Dr. Gwenlyn Found on 9/14 he tells me that the appointment is actually on 9/21 although I do not see that in epic. The next appointment I see with Dr. Gwenlyn Found is on 11/17. Nevertheless the patient is fairly adamant that his appointment is early next week I told him to call ahead before he goes. The wound is a lot better we have been using Santyl over that he just ran out of that all represcribe it. 10/1; patient arrives today  with nothing on his wound. He says he has been using the Santyl but he feels the border foam is too painful. We told him he needs to put a covering on this of some sort perhaps gauze. Indeed after our discussion last time his appointment with Dr. Gwenlyn Found 11/17. Previously he has had a atherectomy and an angioplasty of the right SFA. I note in my previous notes it was said he had bilateral one-vessel runoff I'll need to review the tibial vessels on the angiogram. The patient is currently rating his pain at 8 out of 10 He followed up with Dr. Amalia Hailey at [facility] on 9/27. He emphasized to follow-up with vascular and [place]. Follow-up with them as needed. 10/15; patient ran out of Santyl and has been using Silvadene. He still has constant pain which I think is largely claudication. I have looked over his angiogram results. I need to communicate with Dr. Gwenlyn Found to see if anything else can be done here. He arrives in clinic today with tendon over most of the wound surface area 10/29; we are using silver collagen on the right foot. Still complaining of a lot of pain especially at night. Dr. Gwenlyn Found is out of town this week I will send him an email but he has a follow-up appointment on 11/17. 11/18; patient saw Dr. Gwenlyn Found yesterday. He has a history of a directional atherectomy followed by a drug-coated balloon angioplasty on the right SFA. 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 His right ABI increased from 0.6 2.85 however the right foot wound has not budged. He is going next Wednesday for an attempt to do tibial artery interventions. He mentions that  he has one-vessel runoff via the peroneal.. They are going to attempt to look at the anterior tibial and posterior tibial revascularization next Wednesday. If they were successful at this then I will consider debridement and changing the topical dressings to this wound. Patient is still in a lot of pain 12/2;  the patient underwent revascularization by Dr. Fletcher Anon on 12/19/2019. 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 The posterior tibial artery gives good flow into the whole pedal arch getting collaterals from the peroneal artery the anterior tibial artery was occluded. Unfortunately his right anterior tibial artery is not amenable for revascularization. He had a drug-eluting stent placed in the right common iliac artery into the external iliac artery. The patient arrives saying his pain is about the same. He still has exposed tendon in the base of the wound however there is some healthy looking granulation at the circumference. Perhaps reason for some guarded optimism. Comes in today not wanting debridement asking for pain medication 02/22/2020; patient has not been here in almost 2 months. He has a punched-out area on the right dorsal foot. Completely necrotic surface. We have been using silver collagen when he was last here the beginning of December he ran out of this a long time ago. I think he has been using Silvadene cream. He follows up with Dr. Fletcher Anon or Dr. Gwenlyn Found next week. He arrives in clinic as usual not wanting debridement unfortunately its debridement is what he needs 2/11; this area on the right foot looks somewhat better than when I saw this 2 weeks ago. I did receive a call from Dr. Gwenlyn Found who went over his PAD history. He had a directional atherectomy followed by drug coated balloon angioplasty of his right SFA on 07/16/2019 his right ABI increased from 0.6-0.85 at that point. He underwent a right external iliac artery stenting by Dr. Fletcher Anon on 12/19/2019 his right SFA was patent as was his peroneal. He did not have a patent posterior tibial at the level of the ankle the only patent vessel to his foot  is the anterior tibial artery although that is fortunate in that that is where the or the major wound is. He was not felt to be a candidate for any further angiographic intervention 03/28/2020 upon evaluation today patient appears to be doing well with regard to his wound. Is been tolerating the dressing changes without complication. With that being said he has been using Santyl this definitely seems to be doing better than nothing is for breakdown some of the slough and biofilm on the surface of the wound. Fortunately there is no evidence of active infection which is great news. No fevers, chills, nausea, vomiting, or diarrhea. 3/25; since the patient was last here I was called by Dr. Gwenlyn Found. They do not anticipate any more interventions at this point. I have listed his impressions on the right leg vasculature below " Mr. Hammers returns today for follow-up. He follows with Dr. Dellia Nims at the wound care clinic for a slowly healing wound on the dorsal aspect of his right foot. He has had 2 interventions on this, one in July 2021 where I revascularized an occluded right SFA. He had one-vessel runoff. He did have spasm of his peroneal which ultimately was shown to have resolved repeat CT angiography. In November he had stenting of  a calcified physiologically significant right external iliac artery. His anterior tibial artery does not reach past the ankle and therefore revascularization would be difficult. I would continue with aggressive local wound care. He also has claudication on the left. I attempted left SFA intervention in July but was unsuccessful in reentering the distal lumen. Unfortunately, he only has 1 patent tibial vessel on the left common anterior tibial, and no evidence of critical limb ischemia so I am hesitant to recommend tibial pedal access for a retrograde approach." He has been using Santyl on the right dorsal foot. Arrives with the wound above 0.5 cm in depth the surface does not look  too bad. He is also complaining about pain in the left fifth toe without any antecedent trauma he is aware of. Says that the pain has been there for the last 5 or 6 weeks 4/15; the area on his right dorsal foot perhaps slightly better. We have been using Santyl. Dr. Kennon Holter last opinions on his vascular supply are listed in my note of 04/18/2020. Again he complains of pain in the left fifth toe. We ordered an x-ray of this almost 3 weeks ago. He did not get it done 4/29; patient has been using Santyl to the right dorsal foot wound. He reports improvement to the wound. He denies signs and symptoms of infection. He was able to obtain the left foot x-ray that was ordered because he was complaining about toe pain at last clinic visit. Electronic Signature(s) Signed: 05/23/2020 3:41:02 PM By: Kalman Shan DO Entered By: Kalman Shan on 05/23/2020 15:37:27 -------------------------------------------------------------------------------- Physical Exam Details Patient Name: Date of Service: [name] 05/23/2020 2:00 PM Medical Record Number: [id number] Patient Account Number: [id number] Date of Birth/Sex: Treating RN: 03-Nov-1960 (60 y.o. Marcheta Grammes Primary Care Provider: Cipriano Mile Other Clinician: Referring Provider: Treating Provider/Extender: Phineas Douglas Weeks in Treatment: 40 Constitutional respirations regular, non-labored and within target range for patient.Marland Kitchen Psychiatric pleasant and cooperative. Notes Right dorsal foot: Small wound with granulation tissue surrounded by nonviable tissue. No signs of infection. No swelling noted. No erythema or increased warmth to the foot. Electronic Signature(s) Signed: 05/23/2020 3:41:02 PM By: Kalman Shan DO Entered By: Kalman Shan on 05/23/2020 15:37:43 -------------------------------------------------------------------------------- Physician Orders Details Patient Name: Date of Service: [name], [name] 05/23/2020 2:00 PM Medical Record Number: [id number] Patient Account Number: [id number] Date of Birth/Sex: Treating RN: November 07, 1960 (60 y.o. Marcheta Grammes Primary Care Provider: Cipriano Mile Other Clinician: Referring Provider: Treating Provider/Extender: Phineas Douglas Weeks in Treatment: 25 Verbal / Phone Orders: No Diagnosis Coding ICD-10 Coding Code Description I73.89 Other specified peripheral vascular diseases E11.621 Type 2 diabetes mellitus with foot ulcer L97.512 Non-pressure chronic ulcer of other part of right foot with fat layer exposed F17.218 Nicotine dependence, cigarettes, with other nicotine-induced disorders M79.672 Pain in left foot Follow-up Appointments ppointment in 2 weeks. - with Dr. Dellia Nims Return A Other: - Make appt with PCP regarding left foot pain, possible claudication r/t know blood flow issues. Bathing/ Shower/ Hygiene May shower and wash wound with soap and water. - with dressing changes. Edema Control - Lymphedema / SCD / Other Elevate legs to the level of the heart or above for 30 minutes daily and/or when sitting, a frequency of: - 3-4 times a throughout the day. Avoid standing for long periods of time. Off-Loading Wound #1 Right,Dorsal Foot Open toe surgical shoe to: - ensure no pressure to top of foot. Additional Orders /  Instructions Follow Nutritious Diet Wound Treatment Wound #1 - Foot Wound Laterality: Dorsal, Right Cleanser: Soap and Water Every Other Day/30 Days Discharge Instructions: May shower and wash wound with dial antibacterial soap and water prior to dressing change. Prim Dressing: Santyl Ointment Every Other Day/30 Days ary Discharge Instructions: Apply nickel thick amount to wound bed as instructed Secondary Dressing: ComfortFoam Border, 4x4 in (silicone border) (Generic) Every Other Day/30 Days Discharge Instructions: Apply over primary dressing as directed. Electronic Signature(s) Signed:  05/23/2020 3:41:02 PM By: Kalman Shan DO Previous Signature: 05/23/2020 2:47:01 PM Version By: Lorrin Jackson Entered By: Kalman Shan on 05/23/2020 15:37:55 -------------------------------------------------------------------------------- Problem List Details Patient Name: Date of Service: [name], [name] 05/23/2020 2:00 PM Medical Record Number: [id number] Patient Account Number: [id number] Date of Birth/Sex: Treating RN: 1960/06/22 (60 y.o. Marcheta Grammes Primary Care Provider: Cipriano Mile Other Clinician: Referring Provider: Treating Provider/Extender: Phineas Douglas Weeks in Treatment: 53 Active Problems ICD-10 Encounter Code Description Active Date MDM Diagnosis I73.89 Other specified peripheral vascular diseases 08/15/2019 No Yes E11.621 Type 2 diabetes mellitus with foot ulcer 08/15/2019 No Yes L97.512 Non-pressure chronic ulcer of other part of right foot with fat layer exposed 08/15/2019 No Yes F17.218 Nicotine dependence, cigarettes, with other nicotine-induced disorders 08/15/2019 No Yes M79.672 Pain in left foot 04/18/2020 No Yes Inactive Problems ICD-10 Code Description Active Date Inactive Date I10 Essential (primary) hypertension 08/15/2019 08/15/2019 Resolved Problems Electronic Signature(s) Signed: 05/23/2020 3:41:02 PM By: Kalman Shan DO Previous Signature: 05/23/2020 2:46:35 PM Version By: Lorrin Jackson Entered By: Kalman Shan on 05/23/2020 15:35:42 -------------------------------------------------------------------------------- Progress Note Details Patient Name: Date of Service: [name], [name] 05/23/2020 2:00 PM Medical Record Number: [id number] Patient Account Number: [id number] Date of Birth/Sex: Treating RN: Jan 07, 1961 (60 y.o. Marcheta Grammes Primary Care Provider: Cipriano Mile Other Clinician: Referring Provider: Treating Provider/Extender: Phineas Douglas Weeks in Treatment:  40 Subjective Chief Complaint Information obtained from Patient Right foot ulcers History of Present Illness (HPI) 08/15/2019 upon evaluation today patient presents for evaluation here in our clinic concerning issues that he is having with wounds on the dorsal surface of his right foot. This is something that was noted to occur after he was obtaining cortisone injections with [facility]. Subsequently he developed ulcerations he was then referred to Dr. Alvester Chou who performed arterial testing and subsequently found that the patient had poor arterial flow with an ABI of 0.6 and a TBI of 0.26. With that being said he has since on 07/16/2019 had an angiogram on the right with improvement of his ABI to 0.85 and TBI to 0.5. Obviously this is not perfect but is definitely far superior to where it was previous. He does have some necrotic tissue on the surface of the wounds is going to need debriding away he has been using Silvadene on this but to be honest that is not can to do anything for these wounds. He needs something more to clear this away sharp debridement is ideal and we may even look towards Santyl as well. The patient does have a history of diabetes noted in his chart though is not on medication and his A1c was around 6.4 so is not terrible. With that being said he does have known peripheral vascular disease he is actually have an angiogram on the left tomorrow. He also has hypertension and he does smoke. 8/27; this is a patient has been here once before about 6 weeks ago. Since then he was hospitalized from  7/22 through 7/23 by Dr. Gwenlyn Found for an attempt at revascularization. He had previously been seen by Dr. Amalia Hailey of podiatry. He is a continued tobacco smoker. His angiogram on 6/21 via revealed a occluded SFAs bilaterally with one-vessel runoff. He had a atherectomy followed by a drug-coated balloon angioplasty of the right SFA. 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 He had some improvement in his right ABI from 0.6-0.85. There is also an attempt to open his left SFA but that was unsuccessful. He does not have a wound on the left side. We are using Santyl on the patient's wounds on the right dorsal foot. He says he has about a 1 minute exercise tolerance before stopping because of pain compatible with claudication 9/10; we are using Santyl on the wound on the right dorsal foot. Still has significant claudication symptoms. He is status post angioplasty of the right SFA and atherectomy. He has one-vessel runoff via a diseased peroneal. He follows up with Dr. Gwenlyn Found on 9/14 9/17; I had my notes from last week that the patient was to see Dr. Gwenlyn Found on 9/14 he tells me that the appointment is actually on 9/21 although I do not see that in epic. The next appointment I see with Dr. Gwenlyn Found is on 11/17. Nevertheless the patient is fairly adamant that his appointment is early next week I told him to call ahead before he goes. The wound is a lot better we have been using Santyl over that he just ran out of that all represcribe it. 10/1; patient arrives today with nothing on his wound. He says he has been using the Santyl but he feels the border foam is too painful. We told him he needs to put a covering on this of some sort perhaps gauze. Indeed after our discussion last time his appointment with Dr. Gwenlyn Found 11/17. Previously he has had a atherectomy and an angioplasty of the right SFA. I note in my previous notes it was said he had bilateral one-vessel runoff I'll need to review the tibial vessels on the angiogram. The patient is currently rating his pain at 8 out of 10 He followed up with Dr. Amalia Hailey at [facility] on 9/27. He emphasized to follow-up with vascular and [place]. Follow-up with them as needed. 10/15; patient ran out of Santyl and has been using Silvadene. He still has constant pain which I think is largely claudication. I have looked over his  angiogram results. I need to communicate with Dr. Gwenlyn Found to see if anything else can be done here. He arrives in clinic today with tendon over most of the wound surface area 10/29; we are using silver collagen on the right foot. Still complaining of a lot of pain especially at night. Dr. Gwenlyn Found is out of town this week I will send him an email but he has a follow-up appointment on 11/17. 11/18; patient saw Dr. Gwenlyn Found yesterday. He has a history of a directional atherectomy followed by a drug-coated balloon angioplasty on the right SFA. 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 His right ABI increased from 0.6 2.85 however the right foot wound has not budged. He is going next Wednesday for an attempt to do tibial artery interventions. He mentions that he has one-vessel runoff via the peroneal.. They are going to attempt to look at the anterior tibial and posterior tibial revascularization next Wednesday. If they  were successful at this then I will consider debridement and changing the topical dressings to this wound. Patient is still in a lot of pain 12/2; the patient underwent revascularization by Dr. Fletcher Anon on 12/19/2019. 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 The posterior tibial artery gives good flow into the whole pedal arch getting collaterals from the peroneal artery the anterior tibial artery was occluded. Unfortunately his right anterior tibial artery is not amenable for revascularization. He had a drug-eluting stent placed in the right common iliac artery into the external iliac artery. The patient arrives saying his pain is about the same. He still has exposed tendon in the base of the wound however there is some healthy looking granulation at the  circumference. Perhaps reason for some guarded optimism. Comes in today not wanting debridement asking for pain medication 02/22/2020; patient has not been here in almost 2 months. He has a punched-out area on the right dorsal foot. Completely necrotic surface. We have been using silver collagen when he was last here the beginning of December he ran out of this a long time ago. I think he has been using Silvadene cream. He follows up with Dr. Fletcher Anon or Dr. Gwenlyn Found next week. He arrives in clinic as usual not wanting debridement unfortunately its debridement is what he needs 2/11; this area on the right foot looks somewhat better than when I saw this 2 weeks ago. I did receive a call from Dr. Gwenlyn Found who went over his PAD history. He had a directional atherectomy followed by drug coated balloon angioplasty of his right SFA on 07/16/2019 his right ABI increased from 0.6-0.85 at that point. He underwent a right external iliac artery stenting by Dr. Fletcher Anon on 12/19/2019 his right SFA was patent as was his peroneal. He did not have a patent posterior tibial at the level of the ankle the only patent vessel to his foot is the anterior tibial artery although that is fortunate in that that is where the or the major wound is. He was not felt to be a candidate for any further angiographic intervention 03/28/2020 upon evaluation today patient appears to be doing well with regard to his wound. Is been tolerating the dressing changes without complication. With that being said he has been using Santyl this definitely seems to be doing better than nothing is for breakdown some of the slough and biofilm on the surface of the wound. Fortunately there is no evidence of active infection which is great news. No fevers, chills, nausea, vomiting, or diarrhea. 3/25; since the patient was last here I was called by Dr. Gwenlyn Found. They do not anticipate any more interventions at this point. I have listed his impressions on the right leg  vasculature below " Mr. Reichle returns today for follow-up. He follows with Dr. Dellia Nims at the wound care clinic for a slowly healing wound on the dorsal aspect of his right foot. He has had 2 interventions on this, one in July 2021 where I revascularized an occluded right SFA. He had one-vessel runoff. He did have spasm of his peroneal which ultimately was shown to have resolved repeat CT angiography. In November he had stenting of a calcified physiologically significant right external iliac artery. His anterior tibial artery does not reach past the ankle and therefore revascularization would be difficult. I would  continue with aggressive local wound care. He also has claudication on the left. I attempted left SFA intervention in July but was unsuccessful in reentering the distal lumen. Unfortunately, he only has 1 patent tibial vessel on the left common anterior tibial, and no evidence of critical limb ischemia so I am hesitant to recommend tibial pedal access for a retrograde approach." He has been using Santyl on the right dorsal foot. Arrives with the wound above 0.5 cm in depth the surface does not look too bad. He is also complaining about pain in the left fifth toe without any antecedent trauma he is aware of. Says that the pain has been there for the last 5 or 6 weeks 4/15; the area on his right dorsal foot perhaps slightly better. We have been using Santyl. Dr. Kennon Holter last opinions on his vascular supply are listed in my note of 04/18/2020. Again he complains of pain in the left fifth toe. We ordered an x-ray of this almost 3 weeks ago. He did not get it done 4/29; patient has been using Santyl to the right dorsal foot wound. He reports improvement to the wound. He denies signs and symptoms of infection. He was able to obtain the left foot x-ray that was ordered because he was complaining about toe pain at last clinic visit. Patient History Information obtained from Patient. Family  History Diabetes - Siblings, Heart Disease - Father, No family history of Cancer, Hereditary Spherocytosis, Hypertension, Kidney Disease, Lung Disease, Seizures, Stroke, Thyroid Problems, Tuberculosis. Social History Current every day smoker - 1/2 pack per day, Marital Status - Separated, Alcohol Use - Moderate, Drug Use - No History, Caffeine Use - Rarely. Medical History Cardiovascular Patient has history of Hypertension, Peripheral Arterial Disease Endocrine Patient has history of Type II Diabetes Neurologic Patient has history of Neuropathy Medical A Surgical History Notes nd Cardiovascular Hyperlipidemia Objective Constitutional respirations regular, non-labored and within target range for patient.. Vitals Time Taken: 2:21 PM, Height: 62 in, Weight: 130 lbs, BMI: 23.8, Temperature: 98.3 F, Pulse: 101 bpm, Respiratory Rate: 17 breaths/min, Blood Pressure: 164/82 mmHg. Psychiatric pleasant and cooperative. General Notes: Right dorsal foot: Small wound with granulation tissue surrounded by nonviable tissue. No signs of infection. No swelling noted. No erythema or increased warmth to the foot. Integumentary (Hair, Skin) Wound #1 status is Open. Original cause of wound was Gradually Appeared. The date acquired was: 03/26/2019. The wound has been in treatment 40 weeks. The wound is located on the Right,Dorsal Foot. The wound measures 0.6cm length x 0.5cm width x 0.2cm depth; 0.236cm^2 area and 0.047cm^3 volume. There is Fat Layer (Subcutaneous Tissue) exposed. There is no tunneling or undermining noted. There is a small amount of serosanguineous drainage noted. The wound margin is distinct with the outline attached to the wound base. There is large (67-100%) red granulation within the wound bed. There is no necrotic tissue within the wound bed. Assessment Active Problems ICD-10 Other specified peripheral vascular diseases Type 2 diabetes mellitus with foot ulcer Non-pressure  chronic ulcer of other part of right foot with fat layer exposed Nicotine dependence, cigarettes, with other nicotine-induced disorders Pain in left foot Patient's wound is overall stable. He has good granulation tissue present. There was nonviable tissue that surrounded this and this was removed. Overall it looks well-healing. He would like to continue with Santyl. We discussed the results of the left foot x-ray. It did not show any acute osseous abnormalities. His toe pain may be related to claudication from his arterial  disease. I recommended he follow-up with his primary care physician to discuss this further. Procedures Wound #1 Pre-procedure diagnosis of Wound #1 is a Diabetic Wound/Ulcer of the Lower Extremity located on the Right,Dorsal Foot .Severity of Tissue Pre Debridement is: Fat layer exposed. There was a Excisional Skin/Subcutaneous Tissue Debridement with a total area of 0.3 sq cm performed by Kalman Shan, DO. With the following instrument(s): Curette to remove Non-Viable tissue/material. Material removed includes Subcutaneous Tissue after achieving pain control using Lidocaine 4% T opical Solution. No specimens were taken. A time out was conducted at 15:19, prior to the start of the procedure. A Minimum amount of bleeding was controlled with Pressure. The procedure was tolerated well. Post Debridement Measurements: 0.6cm length x 0.5cm width x 0.2cm depth; 0.047cm^3 volume. Character of Wound/Ulcer Post Debridement is stable. Severity of Tissue Post Debridement is: Fat layer exposed. Post procedure Diagnosis Wound #1: Same as Pre-Procedure Plan Follow-up Appointments: Return Appointment in 2 weeks. - with Dr. Dellia Nims Other: - Make appt with PCP regarding left foot pain, possible claudication r/t know blood flow issues. Bathing/ Shower/ Hygiene: May shower and wash wound with soap and water. - with dressing changes. Edema Control - Lymphedema / SCD / Other: Elevate legs to  the level of the heart or above for 30 minutes daily and/or when sitting, a frequency of: - 3-4 times a throughout the day. Avoid standing for long periods of time. Off-Loading: Wound #1 Right,Dorsal Foot: Open toe surgical shoe to: - ensure no pressure to top of foot. Additional Orders / Instructions: Follow Nutritious Diet WOUND #1: - Foot Wound Laterality: Dorsal, Right Cleanser: Soap and Water Every Other Day/30 Days Discharge Instructions: May shower and wash wound with dial antibacterial soap and water prior to dressing change. Prim Dressing: Santyl Ointment Every Other Day/30 Days ary Discharge Instructions: Apply nickel thick amount to wound bed as instructed Secondary Dressing: ComfortFoam Border, 4x4 in (silicone border) (Generic) Every Other Day/30 Days Discharge Instructions: Apply over primary dressing as directed. 1. Santyl daily to the wound 2. Follow-up in 2 weeks Electronic Signature(s) Signed: 05/23/2020 3:41:02 PM By: Kalman Shan DO Entered By: Kalman Shan on 05/23/2020 15:39:36 -------------------------------------------------------------------------------- HxROS Details Patient Name: Date of Service: [name], [name] 05/23/2020 2:00 PM Medical Record Number: [id number] Patient Account Number: [id number] Date of Birth/Sex: Treating RN: [birth year] (60 y.o. Marcheta Grammes Primary Care Provider: Cipriano Mile Other Clinician: Referring Provider: Treating Provider/Extender: Phineas Douglas Weeks in Treatment: 3 Information Obtained From Patient Cardiovascular Medical History: Positive for: Hypertension; Peripheral Arterial Disease Past Medical History Notes: Hyperlipidemia Endocrine Medical History: Positive for: Type II Diabetes Treated with: Diet Blood sugar tested every day: No Neurologic Medical History: Positive for: Neuropathy Immunizations Pneumococcal Vaccine: Received Pneumococcal Vaccination: No Implantable  Devices None Family and Social History Cancer: No; Diabetes: Yes - Siblings; Heart Disease: Yes - Father; Hereditary Spherocytosis: No; Hypertension: No; Kidney Disease: No; Lung Disease: No; Seizures: No; Stroke: No; Thyroid Problems: No; Tuberculosis: No; Current every day smoker - 1/2 pack per day; Marital Status - Separated; Alcohol Use: Moderate; Drug Use: No History; Caffeine Use: Rarely; Financial Concerns: No; Food, Clothing or Shelter Needs: No; Support System Lacking: No; Transportation Concerns: No Electronic Signature(s) Signed: 05/23/2020 3:41:02 PM By: Kalman Shan DO Signed: 05/23/2020 5:13:52 PM By: Lorrin Jackson Entered By: Kalman Shan on 05/23/2020 15:37:34 -------------------------------------------------------------------------------- [place] Details Patient Name: Date of Service: [name], [name] 05/23/2020 Medical Record Number: [id number] Patient Account Number: [id number] Date of  Birth/Sex: Treating RN: 1960-12-22 (59 y.o. 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 F17.218 Nicotine dependence, cigarettes, with other nicotine-induced disorders M79.672 Pain in left foot Facility Procedures CPT4 Code: [id number] Description: [id number] - DEB SUBQ TISSUE 20 SQ CM/< ICD-10 Diagnosis Description L97.512 Non-pressure chronic ulcer of other part of right foot with fat layer exposed E11.621 Type 2 diabetes mellitus with foot ulcer Modifier: Quantity: 1 Physician Procedures : CPT4 Code Description Modifier [id number] - WC PHYS SUBQ TISS 20 SQ CM ICD-10 Diagnosis Description L97.512 Non-pressure  chronic ulcer of other part of right foot with fat layer exposed E11.621 Type 2 diabetes mellitus with foot ulcer Quantity: 1 Electronic Signature(s) Signed: 05/23/2020 3:41:02 PM By: Kalman Shan DO Entered By: Kalman Shan on 05/23/2020 15:40:37

## 2020-05-29 ENCOUNTER — Other Ambulatory Visit: Payer: Self-pay | Admitting: Student

## 2020-05-29 DIAGNOSIS — I739 Peripheral vascular disease, unspecified: Secondary | ICD-10-CM

## 2020-06-05 ENCOUNTER — Ambulatory Visit
Admission: RE | Admit: 2020-06-05 | Discharge: 2020-06-05 | Disposition: A | Discharge status: Home / Self Care | Payer: Medicare Other | Source: Ambulatory Visit | Attending: Student | Admitting: Student

## 2020-06-05 DIAGNOSIS — I739 Peripheral vascular disease, unspecified: Secondary | ICD-10-CM

## 2020-06-06 ENCOUNTER — Encounter (HOSPITAL_BASED_OUTPATIENT_CLINIC_OR_DEPARTMENT_OTHER): Payer: Medicare Other | Admitting: Internal Medicine

## 2020-06-06 ENCOUNTER — Encounter (HOSPITAL_COMMUNITY): Payer: Self-pay | Admitting: Emergency Medicine

## 2020-06-06 ENCOUNTER — Other Ambulatory Visit: Payer: Self-pay

## 2020-06-06 ENCOUNTER — Observation Stay (HOSPITAL_COMMUNITY): Payer: Medicare Other

## 2020-06-06 ENCOUNTER — Emergency Department (HOSPITAL_COMMUNITY): Payer: Medicare Other

## 2020-06-06 ENCOUNTER — Inpatient Hospital Stay (HOSPITAL_COMMUNITY)
Admission: EM | Admit: 2020-06-06 | Discharge: 2020-06-16 | DRG: 252 | Disposition: A | Discharge status: Home Health | Payer: Medicare Other | Attending: Internal Medicine | Admitting: Internal Medicine

## 2020-06-06 DIAGNOSIS — Z20822 Contact with and (suspected) exposure to covid-19: Secondary | ICD-10-CM | POA: Diagnosis present

## 2020-06-06 DIAGNOSIS — L089 Local infection of the skin and subcutaneous tissue, unspecified: Secondary | ICD-10-CM | POA: Diagnosis not present

## 2020-06-06 DIAGNOSIS — E1152 Type 2 diabetes mellitus with diabetic peripheral angiopathy with gangrene: Principal | ICD-10-CM | POA: Diagnosis present

## 2020-06-06 DIAGNOSIS — Z720 Tobacco use: Secondary | ICD-10-CM

## 2020-06-06 DIAGNOSIS — E119 Type 2 diabetes mellitus without complications: Secondary | ICD-10-CM

## 2020-06-06 DIAGNOSIS — Z7984 Long term (current) use of oral hypoglycemic drugs: Secondary | ICD-10-CM

## 2020-06-06 DIAGNOSIS — B9689 Other specified bacterial agents as the cause of diseases classified elsewhere: Secondary | ICD-10-CM | POA: Diagnosis present

## 2020-06-06 DIAGNOSIS — L97529 Non-pressure chronic ulcer of other part of left foot with unspecified severity: Secondary | ICD-10-CM | POA: Diagnosis present

## 2020-06-06 DIAGNOSIS — E785 Hyperlipidemia, unspecified: Secondary | ICD-10-CM | POA: Diagnosis present

## 2020-06-06 DIAGNOSIS — M869 Osteomyelitis, unspecified: Secondary | ICD-10-CM

## 2020-06-06 DIAGNOSIS — M25561 Pain in right knee: Secondary | ICD-10-CM | POA: Diagnosis not present

## 2020-06-06 DIAGNOSIS — I2510 Atherosclerotic heart disease of native coronary artery without angina pectoris: Secondary | ICD-10-CM | POA: Diagnosis present

## 2020-06-06 DIAGNOSIS — E11621 Type 2 diabetes mellitus with foot ulcer: Secondary | ICD-10-CM | POA: Diagnosis present

## 2020-06-06 DIAGNOSIS — L03116 Cellulitis of left lower limb: Secondary | ICD-10-CM | POA: Diagnosis present

## 2020-06-06 DIAGNOSIS — G8929 Other chronic pain: Secondary | ICD-10-CM | POA: Diagnosis present

## 2020-06-06 DIAGNOSIS — Z7982 Long term (current) use of aspirin: Secondary | ICD-10-CM

## 2020-06-06 DIAGNOSIS — F17210 Nicotine dependence, cigarettes, uncomplicated: Secondary | ICD-10-CM | POA: Diagnosis present

## 2020-06-06 DIAGNOSIS — Z7902 Long term (current) use of antithrombotics/antiplatelets: Secondary | ICD-10-CM

## 2020-06-06 DIAGNOSIS — E1142 Type 2 diabetes mellitus with diabetic polyneuropathy: Secondary | ICD-10-CM | POA: Diagnosis present

## 2020-06-06 DIAGNOSIS — F1210 Cannabis abuse, uncomplicated: Secondary | ICD-10-CM | POA: Diagnosis present

## 2020-06-06 DIAGNOSIS — E43 Unspecified severe protein-calorie malnutrition: Secondary | ICD-10-CM | POA: Insufficient documentation

## 2020-06-06 DIAGNOSIS — I10 Essential (primary) hypertension: Secondary | ICD-10-CM | POA: Diagnosis present

## 2020-06-06 DIAGNOSIS — M86172 Other acute osteomyelitis, left ankle and foot: Secondary | ICD-10-CM | POA: Diagnosis present

## 2020-06-06 DIAGNOSIS — Z833 Family history of diabetes mellitus: Secondary | ICD-10-CM

## 2020-06-06 DIAGNOSIS — L97519 Non-pressure chronic ulcer of other part of right foot with unspecified severity: Secondary | ICD-10-CM | POA: Diagnosis present

## 2020-06-06 DIAGNOSIS — Z681 Body mass index (BMI) 19 or less, adult: Secondary | ICD-10-CM

## 2020-06-06 DIAGNOSIS — E1169 Type 2 diabetes mellitus with other specified complication: Secondary | ICD-10-CM | POA: Diagnosis present

## 2020-06-06 DIAGNOSIS — Z9889 Other specified postprocedural states: Secondary | ICD-10-CM

## 2020-06-06 DIAGNOSIS — I70262 Atherosclerosis of native arteries of extremities with gangrene, left leg: Secondary | ICD-10-CM | POA: Diagnosis present

## 2020-06-06 DIAGNOSIS — I739 Peripheral vascular disease, unspecified: Secondary | ICD-10-CM | POA: Diagnosis present

## 2020-06-06 DIAGNOSIS — Z79899 Other long term (current) drug therapy: Secondary | ICD-10-CM

## 2020-06-06 LAB — COMPREHENSIVE METABOLIC PANEL
ALT: 16 U/L (ref 0–44)
AST: 14 U/L — ABNORMAL LOW (ref 15–41)
Albumin: 4.2 g/dL (ref 3.5–5.0)
Alkaline Phosphatase: 68 U/L (ref 38–126)
Anion gap: 7 (ref 5–15)
BUN: 28 mg/dL — ABNORMAL HIGH (ref 6–20)
CO2: 28 mmol/L (ref 22–32)
Calcium: 9.8 mg/dL (ref 8.9–10.3)
Chloride: 103 mmol/L (ref 98–111)
Creatinine, Ser: 1.07 mg/dL (ref 0.61–1.24)
GFR, Estimated: >60 mL/min (ref >60)
Glucose, Bld: 126 mg/dL — ABNORMAL HIGH (ref 70–99)
Potassium: 4 mmol/L (ref 3.5–5.1)
Sodium: 138 mmol/L (ref 135–145)
Total Bilirubin: 0.6 mg/dL (ref 0.3–1.2)
Total Protein: 8 g/dL (ref 6.5–8.1)

## 2020-06-06 LAB — TYPE AND SCREEN
ABO/RH(D): O POS
Antibody Screen: NEGATIVE

## 2020-06-06 LAB — RESP PANEL BY RT-PCR (FLU A&B, COVID) ARPGX2
Influenza A by PCR: NEGATIVE
Influenza B by PCR: NEGATIVE
SARS Coronavirus 2 by RT PCR: NEGATIVE

## 2020-06-06 LAB — CBC WITH DIFFERENTIAL/PLATELET
Abs Immature Granulocytes: 0.01 10*3/uL (ref 0.00–0.07)
Basophils Absolute: 0 10*3/uL (ref 0.0–0.1)
Basophils Relative: 0 %
Eosinophils Absolute: 0.1 10*3/uL (ref 0.0–0.5)
Eosinophils Relative: 2 %
HCT: 41.3 % (ref 39.0–52.0)
Hemoglobin: 13 g/dL (ref 13.0–17.0)
Immature Granulocytes: 0 %
Lymphocytes Relative: 23 %
Lymphs Abs: 1.2 10*3/uL (ref 0.7–4.0)
MCH: 28.6 pg (ref 26.0–34.0)
MCHC: 31.5 g/dL (ref 30.0–36.0)
MCV: 91 fL (ref 80.0–100.0)
Monocytes Absolute: 0.7 10*3/uL (ref 0.1–1.0)
Monocytes Relative: 13 %
Neutro Abs: 3.3 10*3/uL (ref 1.7–7.7)
Neutrophils Relative %: 62 %
Platelets: 320 10*3/uL (ref 150–400)
RBC: 4.54 MIL/uL (ref 4.22–5.81)
RDW: 17.3 % — ABNORMAL HIGH (ref 11.5–15.5)
WBC: 5.4 10*3/uL (ref 4.0–10.5)
nRBC: 0 % (ref 0.0–0.2)

## 2020-06-06 LAB — ABO/RH: ABO/RH(D): O POS

## 2020-06-06 LAB — PROTIME-INR
INR: 1 (ref 0.8–1.2)
Prothrombin Time: 12.8 seconds (ref 11.4–15.2)

## 2020-06-06 LAB — PREALBUMIN: Prealbumin: 29.7 mg/dL (ref 18–38)

## 2020-06-06 LAB — LACTIC ACID, PLASMA: Lactic Acid, Venous: 1.3 mmol/L (ref 0.5–1.9)

## 2020-06-06 MED ORDER — SODIUM CHLORIDE 0.9 % IV SOLN
2.0000 g | Freq: Two times a day (BID) | INTRAVENOUS | Status: DC
  Administered 2020-06-06 – 2020-06-12 (×12): 2 g via INTRAVENOUS
  Filled 2020-06-06 (×12): qty 2

## 2020-06-06 MED ORDER — JUVEN PO PACK
1.0000 | PACK | Freq: Two times a day (BID) | ORAL | Status: DC
  Administered 2020-06-06 – 2020-06-09 (×7): 1 via ORAL
  Filled 2020-06-06 (×8): qty 1

## 2020-06-06 MED ORDER — ENOXAPARIN SODIUM 30 MG/0.3ML IJ SOSY
30.0000 mg | PREFILLED_SYRINGE | Freq: Every day | INTRAMUSCULAR | Status: DC
  Administered 2020-06-06 – 2020-06-16 (×9): 30 mg via SUBCUTANEOUS
  Filled 2020-06-06 (×9): qty 0.3

## 2020-06-06 MED ORDER — CLOPIDOGREL BISULFATE 75 MG PO TABS
75.0000 mg | ORAL_TABLET | Freq: Every day | ORAL | Status: DC
  Administered 2020-06-07 – 2020-06-16 (×10): 75 mg via ORAL
  Filled 2020-06-06 (×10): qty 1

## 2020-06-06 MED ORDER — VANCOMYCIN HCL IN DEXTROSE 1-5 GM/200ML-% IV SOLN
1000.0000 mg | Freq: Once | INTRAVENOUS | Status: AC
  Administered 2020-06-06: 1000 mg via INTRAVENOUS
  Filled 2020-06-06: qty 200

## 2020-06-06 MED ORDER — PROSOURCE PLUS PO LIQD
30.0000 mL | Freq: Two times a day (BID) | ORAL | Status: DC
  Administered 2020-06-06 – 2020-06-09 (×6): 30 mL via ORAL
  Filled 2020-06-06 (×7): qty 30

## 2020-06-06 MED ORDER — LISINOPRIL 10 MG PO TABS
20.0000 mg | ORAL_TABLET | Freq: Every day | ORAL | Status: DC
  Administered 2020-06-07 – 2020-06-16 (×8): 20 mg via ORAL
  Filled 2020-06-06: qty 1
  Filled 2020-06-06 (×3): qty 2
  Filled 2020-06-06: qty 1
  Filled 2020-06-06: qty 2
  Filled 2020-06-06: qty 1
  Filled 2020-06-06 (×2): qty 2

## 2020-06-06 MED ORDER — OXYCODONE-ACETAMINOPHEN 7.5-325 MG PO TABS
1.0000 | ORAL_TABLET | Freq: Four times a day (QID) | ORAL | Status: DC | PRN
  Administered 2020-06-06 – 2020-06-12 (×20): 1 via ORAL
  Filled 2020-06-06 (×21): qty 1

## 2020-06-06 MED ORDER — METRONIDAZOLE 500 MG/100ML IV SOLN
500.0000 mg | Freq: Three times a day (TID) | INTRAVENOUS | Status: DC
  Administered 2020-06-06 – 2020-06-15 (×27): 500 mg via INTRAVENOUS
  Filled 2020-06-06 (×27): qty 100

## 2020-06-06 MED ORDER — ADULT MULTIVITAMIN W/MINERALS CH
1.0000 | ORAL_TABLET | Freq: Every day | ORAL | Status: DC
  Administered 2020-06-06 – 2020-06-16 (×9): 1 via ORAL
  Filled 2020-06-06 (×9): qty 1

## 2020-06-06 MED ORDER — HYDROMORPHONE HCL 1 MG/ML IJ SOLN
1.0000 mg | Freq: Once | INTRAMUSCULAR | Status: AC
  Administered 2020-06-06: 1 mg via INTRAVENOUS
  Filled 2020-06-06: qty 1

## 2020-06-06 MED ORDER — MAGNESIUM HYDROXIDE 400 MG/5ML PO SUSP
30.0000 mL | Freq: Every day | ORAL | Status: DC | PRN
  Filled 2020-06-06: qty 30

## 2020-06-06 MED ORDER — ACETAMINOPHEN 650 MG RE SUPP: 650.0000 mg | Freq: Four times a day (QID) | RECTAL | Status: DC | PRN

## 2020-06-06 MED ORDER — MORPHINE SULFATE (PF) 4 MG/ML IV SOLN
4.0000 mg | INTRAVENOUS | Status: DC | PRN
  Administered 2020-06-10: 4 mg via INTRAVENOUS
  Filled 2020-06-06: qty 1

## 2020-06-06 MED ORDER — PIPERACILLIN-TAZOBACTAM 3.375 G IVPB 30 MIN
3.3750 g | Freq: Once | INTRAVENOUS | Status: AC
  Administered 2020-06-06: 3.375 g via INTRAVENOUS
  Filled 2020-06-06: qty 50

## 2020-06-06 MED ORDER — ASPIRIN EC 81 MG PO TBEC
81.0000 mg | DELAYED_RELEASE_TABLET | Freq: Every day | ORAL | Status: DC
  Administered 2020-06-06 – 2020-06-16 (×9): 81 mg via ORAL
  Filled 2020-06-06 (×10): qty 1

## 2020-06-06 MED ORDER — GABAPENTIN 300 MG PO CAPS
600.0000 mg | ORAL_CAPSULE | Freq: Three times a day (TID) | ORAL | Status: DC
  Administered 2020-06-06 – 2020-06-16 (×30): 600 mg via ORAL
  Filled 2020-06-06 (×30): qty 2

## 2020-06-06 MED ORDER — HYDROCODONE-ACETAMINOPHEN 5-325 MG PO TABS
1.0000 | ORAL_TABLET | ORAL | Status: DC | PRN
  Administered 2020-06-06: 2 via ORAL
  Filled 2020-06-06: qty 2

## 2020-06-06 MED ORDER — ENSURE ENLIVE PO LIQD
237.0000 mL | Freq: Three times a day (TID) | ORAL | Status: DC
  Administered 2020-06-06 – 2020-06-08 (×6): 237 mL via ORAL

## 2020-06-06 MED ORDER — DOCUSATE SODIUM 100 MG PO CAPS
100.0000 mg | ORAL_CAPSULE | Freq: Two times a day (BID) | ORAL | Status: DC
  Administered 2020-06-06 – 2020-06-16 (×12): 100 mg via ORAL
  Filled 2020-06-06 (×16): qty 1

## 2020-06-06 MED ORDER — VANCOMYCIN HCL 750 MG/150ML IV SOLN
750.0000 mg | INTRAVENOUS | Status: DC
  Administered 2020-06-07 – 2020-06-11 (×5): 750 mg via INTRAVENOUS
  Filled 2020-06-06 (×6): qty 150

## 2020-06-06 MED ORDER — HYDROCHLOROTHIAZIDE 25 MG PO TABS
25.0000 mg | ORAL_TABLET | Freq: Every day | ORAL | Status: DC
  Administered 2020-06-07 – 2020-06-08 (×2): 25 mg via ORAL
  Filled 2020-06-06 (×2): qty 1

## 2020-06-06 MED ORDER — ATORVASTATIN CALCIUM 40 MG PO TABS
40.0000 mg | ORAL_TABLET | Freq: Every day | ORAL | Status: DC
  Administered 2020-06-06 – 2020-06-15 (×10): 40 mg via ORAL
  Filled 2020-06-06 (×10): qty 1

## 2020-06-06 MED ORDER — COLLAGENASE 250 UNIT/GM EX OINT
TOPICAL_OINTMENT | Freq: Every day | CUTANEOUS | Status: AC
  Administered 2020-06-07: 1 via TOPICAL
  Filled 2020-06-06 (×3): qty 30

## 2020-06-06 MED ORDER — LISINOPRIL-HYDROCHLOROTHIAZIDE 20-25 MG PO TABS: 1.0000 | ORAL_TABLET | Freq: Every day | ORAL | Status: DC

## 2020-06-06 MED ORDER — ACETAMINOPHEN 325 MG PO TABS
650.0000 mg | ORAL_TABLET | Freq: Four times a day (QID) | ORAL | Status: DC | PRN
  Administered 2020-06-08 – 2020-06-14 (×3): 650 mg via ORAL
  Filled 2020-06-06 (×3): qty 2

## 2020-06-06 NOTE — Consult Note (Signed)
[facility] Nurse wound consult note Consultation was completed by review of records, images and assistance from the bedside nurse/clinical staff.   Reason for Consult:chronic non healing wound, dorsum of the right foot Admitted for left toe; work up for osteomyelitis and orthopedic consultation Wound type: Right dorsal foot: full thickness chronically appearing wound; unclear etiology. Do not think it is related to pressure due to location and appearance.  Pressure Injury POA: NA Measurement:see nursing flowsheet Wound bed:100% yellow Drainage (amount, consistency, odor) none Periwound:intact  Dressing procedure/placement/frequency: Add enzymatic debridement ointment and saline moist gauze. Top with dry dressing. Change daily.   Discussed POC with bedside nurse.  Re consult if needed, will not follow at this time. Thanks  Tyre Beaver [employer], RN,CWOCN, CNS, [place] [phone])

## 2020-06-06 NOTE — ED Triage Notes (Signed)
Patient BIBA from home c/o toe pain x3 months, was instructed by PCP to come to ED due to toe being black. Patient reports left pinky toe is black and painful. Patient is ambulatory. Patient denies any medical history.  BP 152/80 HR 100 RR 16 SpO2 100% RA T 97.3

## 2020-06-06 NOTE — ED Provider Notes (Signed)
[facility] DEPT Provider Note   CSN: [id number] Arrival date & time: 06/06/20  6759     History Chief Complaint  Patient presents with  . Toe Pain    Ruben Gopal. is a 60 y.o. male.  60 year old male with prior medical history as detailed below presents for evaluation.  Patient reports ongoing issues with infection in the left foot fifth digit.  Patient reports that [name] was given an MRI yesterday.  [name] was told by his primary care provider to come to the ED for treatment.  Patient reports that [name] has been told that [name] may have infection in his bone.  Patient denies fever.  Patient reports worsening pain to the left foot.  The history is provided by the patient and medical records.  Toe Pain This is a chronic problem. The current episode started more than 1 week ago. The problem occurs constantly. The problem has been gradually worsening. Nothing aggravates the symptoms. Nothing relieves the symptoms.       Past Medical History:  Diagnosis Date  . Diabetes mellitus without complication ([place])   . Hyperlipidemia   . Hypertension   . Peripheral vascular disease ([place])   . Tobacco use     Patient Active Problem List   Diagnosis Date Noted  . PVD (peripheral vascular disease) ([place]) 12/19/2019  . Claudication in peripheral vascular disease ([place]) 08/16/2019  . Critical ischemia of foot ([place]) 07/16/2019  . Critical lower limb ischemia ([place]) 07/10/2019  . Pure hypercholesterolemia 10/13/2018  . Type 2 diabetes mellitus without complication, without long-term current use of insulin ([place]) 10/13/2018  . Tobacco use disorder 10/13/2018  . Essential hypertension, benign 10/23/2009    Past Surgical History:  Procedure Laterality Date  . ABDOMINAL AORTOGRAM W/LOWER EXTREMITY Left 07/16/2019   Procedure: ABDOMINAL AORTOGRAM W/LOWER EXTREMITY;  Surgeon: Lorretta Harp, MD;  Location: [facility];  Service: Cardiovascular;  Laterality:  Left;  . ABDOMINAL AORTOGRAM W/LOWER EXTREMITY Left 08/16/2019  . ABDOMINAL AORTOGRAM W/LOWER EXTREMITY N/A 08/16/2019   Procedure: ABDOMINAL AORTOGRAM W/LOWER EXTREMITY;  Surgeon: Lorretta Harp, MD;  Location: [facility];  Service: Cardiovascular;  Laterality: N/A;  . ABDOMINAL AORTOGRAM W/LOWER EXTREMITY Bilateral 12/19/2019   Procedure: ABDOMINAL AORTOGRAM W/LOWER EXTREMITY;  Surgeon: Wellington Hampshire, MD;  Location: [facility];  Service: Cardiovascular;  Laterality: Bilateral;  . arm surgery    . PERIPHERAL VASCULAR BALLOON ANGIOPLASTY  08/16/2019   Procedure: PERIPHERAL VASCULAR BALLOON ANGIOPLASTY;  Surgeon: Lorretta Harp, MD;  Location: [facility];  Service: Cardiovascular;;  attempted PTA of Left SFA  . PERIPHERAL VASCULAR INTERVENTION Right 07/16/2019   Procedure: PERIPHERAL VASCULAR INTERVENTION;  Surgeon: Lorretta Harp, MD;  Location: [facility];  Service: Cardiovascular;  Laterality: Right;  . PERIPHERAL VASCULAR INTERVENTION Right 12/19/2019   Procedure: PERIPHERAL VASCULAR INTERVENTION;  Surgeon: Wellington Hampshire, MD;  Location: [facility];  Service: Cardiovascular;  Laterality: Right;  iliac       Family History  Problem Relation Age of Onset  . Cancer Mother   . Diabetes Father     Social History   Tobacco Use  . Smoking status: Current Every Day Smoker    Packs/day: 0.50    Years: 40.00    Pack years: 20.00    Types: Cigarettes  . Smokeless tobacco: Never Used  Vaping Use  . Vaping Use: Never used  Substance Use Topics  . Alcohol use: Yes  Comment: beer and liquor daily  . Drug use: Yes    Frequency: 3.0 times per week    Types: Marijuana    Home Medications Prior to Admission medications   Medication Sig Start Date End Date Taking? Authorizing Provider  aspirin EC 81 MG EC tablet Take 1 tablet (81 mg total) by mouth daily. Swallow whole. Patient taking differently: Take 81 mg by mouth daily at 2 PM.  Swallow whole. 07/18/19   Furth, Cadence H, PA-C  atorvastatin (LIPITOR) 40 MG tablet Take 1 tablet by mouth at bedtime. 04/06/20   [provider]  atorvastatin (LIPITOR) 80 MG tablet Take 1 tablet (80 mg total) by mouth daily. Patient not taking: Reported on 05/10/2020 07/18/19   Kathlen Mody, Cadence H, PA-C  Blood Pressure Monitor KIT Check blood pressure daily 10/20/18   Jacelyn Pi, Lilia Argue, MD  clopidogrel (PLAVIX) 75 MG tablet Take 1 tablet (75 mg total) by mouth daily with breakfast. 07/18/19   Furth, Cadence H, PA-C  ezetimibe (ZETIA) 10 MG tablet Take 1 tablet (10 mg total) by mouth daily. 01/22/20 04/21/20  O'NealCassie Freer, MD  ezetimibe (ZETIA) 10 MG tablet Take 10 mg by mouth daily.    [provider]  gabapentin (NEURONTIN) 600 MG tablet Take 600 mg by mouth 3 (three) times daily. 02/26/20   [provider]  lisinopril-hydrochlorothiazide (ZESTORETIC) 20-25 MG tablet Take 1 tablet by mouth daily. 04/16/20   [provider]  metFORMIN (GLUCOPHAGE-XR) 500 MG 24 hr tablet Take 500 mg by mouth daily. 02/26/20   [provider]  ondansetron (ZOFRAN) 4 MG tablet Take 1 tablet (4 mg total) by mouth every 6 (six) hours. 05/10/20   Valarie Merino, MD  SANTYL ointment Apply 1 application topically daily as needed (wound care).  08/15/19   [provider]  SSD 1 % cream Apply 1 application topically daily as needed (wound care).  08/07/19   [provider]  traMADol (ULTRAM) 50 MG tablet Take 50 mg by mouth 3 (three) times daily as needed (pain).  09/07/19   [provider]    Allergies    Patient has no known allergies.  Review of Systems   Review of Systems  All other systems reviewed and are negative.   Physical Exam Updated Vital Signs BP (!) 156/92   Pulse 85   Temp 97.9 F (36.6 C)   Resp 16   SpO2 100%   Physical Exam Vitals and nursing note reviewed.  Constitutional:      General: [name] is not in acute distress.     Appearance: Normal appearance. [name] is well-developed.  HENT:     Head: Normocephalic and atraumatic.  Eyes:     Conjunctiva/sclera: Conjunctivae normal.     Pupils: Pupils are equal, round, and reactive to light.  Cardiovascular:     Rate and Rhythm: Normal rate and regular rhythm.     Heart sounds: Normal heart sounds.  Pulmonary:     Effort: Pulmonary effort is normal. No respiratory distress.     Breath sounds: Normal breath sounds.  Abdominal:     General: There is no distension.     Palpations: Abdomen is soft.     Tenderness: There is no abdominal tenderness.  Musculoskeletal:        General: No deformity. Normal range of motion.     Cervical back: Normal range of motion and neck supple.     Comments: Left foot, 5th digit - dark, edematous, likely  gangrene   Skin:    General: Skin is warm and dry.  Neurological:     Mental Status: [name] is alert and oriented to person, place, and time.     ED Results / Procedures / Treatments   Labs (all labs ordered are listed, but only abnormal results are displayed) Labs Reviewed  COMPREHENSIVE METABOLIC PANEL - Abnormal; Notable for the following components:      Result Value   Glucose, Bld 126 (*)    BUN 28 (*)    AST 14 (*)    All other components within normal limits  CBC WITH DIFFERENTIAL/PLATELET - Abnormal; Notable for the following components:   RDW 17.3 (*)    All other components within normal limits  RESP PANEL BY RT-PCR (FLU A&B, COVID) [id number]  CULTURE, BLOOD (ROUTINE X 2)  CULTURE, BLOOD (ROUTINE X 2)  PROTIME-INR  LACTIC ACID, PLASMA  LACTIC ACID, PLASMA  TYPE AND SCREEN    EKG None  Radiology US ARTERIAL SEG MULTIPLE LE (ABI, SEGMENTAL PRESSURES, PVR'S)  Result Date: 06/05/2020 CLINICAL DATA:  Peripheral vascular disease EXAM: NONINVASIVE PHYSIOLOGIC VASCULAR STUDY OF BILATERAL LOWER EXTREMITIES TECHNIQUE: Non-invasive vascular evaluation of both lower extremities was performed at rest, including  calculation of ankle-brachial indices, multiple segmental pressure evaluation, segmental Doppler and segmental pulse volume recording. COMPARISON:  None. FINDINGS: Right Lower Extremity Resting ABI:  1.07 Resting TBI: 0.35 Segmental Pressures: Normal segmental pressures, no significant (20 mmHg) pressure gradient between adjacent segments. Great toe pressure: 49 mm Hg Arterial Waveforms: Monophasic waveform seen in the posterior tibial and dorsalis pedis arteries. PVRs: Normal PVRs with maintained waveform amplitude, augmentation and quality. Left Lower Extremity: Resting ABI: 0.41 Resting TBI: 0.33 Segmental Pressures: Decreased segmental pressures seen throughout the left lower extremity. Great toe pressure: 46 mm Hg Arterial Waveforms: Monophasic waveform seen throughout the left lower extremity. PVRs: Normal PVRs with maintained waveform amplitude, augmentation and quality. Other: Symmetric upper extremity pressures. Ankle Brachial index > 1.4 Non diagnostic secondary to incompressible vessel calcifications 1.0-1.4       Normal 0.9-0.99     Borderline PAD 0.8-0.89     Mild PAD 0.5-0.79     Moderate PAD < 0.5          Severe PAD Toe Brachial Index Normal     >0.65 Moderate  0.53-0.64 Severe     <0.23 Toe Pressures Absolute toe pressure >15mHg sufficient for wound healing. Toe pressures <565mg = critical limb ischemia. IMPRESSION: 1. 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 2. Findings suspicious for significant right lower extremity occlusive disease given moderately decreased TBI. Electronically Signed   By: FaMiachel Roux.D.   On: 06/05/2020 11:34   DG Foot Complete Left  Result Date: 06/06/2020 CLINICAL DATA:  Fifth toe infection EXAM: LEFT FOOT - COMPLETE 3+ VIEW COMPARISON:  May 10, 2020, March 08, 2019 FINDINGS: No acute fracture or dislocation. Unchanged well corticated ossific density at the base of the fifth proximal phalanx consistent with sequela of  remote prior trauma. Mild midfoot degenerative changes. No area of erosion or osseous destruction. No unexpected radiopaque foreign body. No soft tissue air. Vascular calcifications. Bone island of the calcaneus. IMPRESSION: No radiographic evidence of osteomyelitis. Electronically Signed   By: StValentino SaxonD   On: 06/06/2020 08:40    Procedures Procedures   Medications Ordered in ED Medications  vancomycin (VANCOCIN) IVPB 1000 mg/200 mL premix (1,000 mg Intravenous New Bag/Given 06/06/20 0902)  piperacillin-tazobactam (ZOSYN) IVPB 3.375  g (0 g Intravenous Stopped 06/06/20 0856)    ED Course  I have reviewed the triage vital signs and the nursing notes.  Pertinent labs & imaging results that were available during my care of the patient were reviewed by me and considered in my medical decision making (see chart for details).    MDM Rules/Calculators/A&P                          MDM  MSE complete  Federick Levene. was evaluated in Emergency Department on 06/06/2020 for the symptoms described in the history of present illness. [name] was evaluated in the context of the global COVID-19 pandemic, which necessitated consideration that the patient might be at risk for infection with the SARS-CoV-2 virus that causes COVID-19. Institutional protocols and algorithms that pertain to the evaluation of patients at risk for COVID-19 are in a state of rapid change based on information released by regulatory bodies including the CDC and federal and state organizations. These policies and algorithms were followed during the patient's care in the ED.  Patient presented for evaluation of suspected osteomyelitis in the left foot.  Patient given broad-spectrum antibiotics in the ED.  Patient would likely benefit from admission for further work-up and treatment.  Admitting services were case and will evaluate for admission.   Final Clinical Impression(s) / ED Diagnoses Final diagnoses:  Toe  infection    Rx / DC Orders ED Discharge Orders    None       Valarie Merino, MD 06/06/20 [phone]

## 2020-06-06 NOTE — Discharge Instructions (Signed)

## 2020-06-06 NOTE — Progress Notes (Signed)
Pharmacy Antibiotic Note  [name]. is a 60 y.o. male admitted on 06/06/2020 with infection of left foot fifth digit. Patient was sent for outpatient imaging and referred to ED but imaging unavailable, patient unsure where imaging was obtained. Foot XR did not show osteo but MRI pending. Pharmacy has been consulted for vancomycin and cefepime dosing.  Plan: Vancomycin 1000 mg iv once followed by 750 mg IV Q 24 hrs. Goal AUC 400-550. Expected AUC: 567 SCr used: 1.07  Cefepime 2 g iv q 12 hours  Flagyl 500 mg iv q 8 hour per MD  F/U renal function, culture results, and clinical status Levels as indicated     Temp (24hrs), Avg:98.1 F (36.7 C), Min:97.9 F (36.6 C), Max:98.2 F (36.8 C)  Recent Labs  Lab 06/06/20 0802  WBC 5.4  CREATININE 1.07  LATICACIDVEN 1.3    CrCl cannot be calculated (Unknown ideal weight.).    No Known Allergies  Antimicrobials this admission: vancomycin 5/13 >>  cefepime 5/13 >>  Flagyl 5/13 >> Zosyn 5/13 x 1  Dose adjustments this admission:   Microbiology results: 5/13 BCx: sent  Thank you for allowing pharmacy to be a part of this patient's care.  Ulice Dash D 06/06/2020 11:15 AM

## 2020-06-06 NOTE — H&P (Signed)
History and Physical    Oluwanifemi [employer] WUJ:[id number] DOB: Mar 14, 1960 DOA: 06/06/2020  PCP: Cipriano Mile, NP  Patient coming from: Home  Chief Complaint: pain in left pinky toe  HPI: [name]. is a 60 y.o. male with medical history significant of CAD, HTN, PAD. Presenting with left foot pain. Reports he's had pain in the left pinky toe for 3 months. He has been following with wound care for a wound in his right foot. He says that over the last several days, the pain in his left pinky toe has worsened. It's sharp and constant. He spoke with his PCP about it a couple days ago. He was sent for imaging of the foot, but can not tell me where. He states based on that imaging result, he was referred to the ED for evaluation. He denies any other aggravating or alleviating factors.    ED Course: He was started con vanc/zosyn. Foot XR did not show osteo. MRI foot ordered. [facility] called for admission.    Review of Systems:  Denies CP, palpitations, dyspnea, syncopal episodes, N/V/D, fevers, HA, sick contacts. Reports left foot pain. Review of systems is otherwise negative for all not mentioned in HPI.   PMHx Past Medical History:  Diagnosis Date  . Diabetes mellitus without complication ([place])   . Hyperlipidemia   . Hypertension   . Peripheral vascular disease ([place])   . Tobacco use     PSHx Past Surgical History:  Procedure Laterality Date  . ABDOMINAL AORTOGRAM W/LOWER EXTREMITY Left 07/16/2019   Procedure: ABDOMINAL AORTOGRAM W/LOWER EXTREMITY;  Surgeon: Lorretta Harp, MD;  Location: [facility];  Service: Cardiovascular;  Laterality: Left;  . ABDOMINAL AORTOGRAM W/LOWER EXTREMITY Left 08/16/2019  . ABDOMINAL AORTOGRAM W/LOWER EXTREMITY N/A 08/16/2019   Procedure: ABDOMINAL AORTOGRAM W/LOWER EXTREMITY;  Surgeon: Lorretta Harp, MD;  Location: [place];  Service: Cardiovascular;  Laterality: N/A;  . ABDOMINAL AORTOGRAM W/LOWER EXTREMITY Bilateral  12/19/2019   Procedure: ABDOMINAL AORTOGRAM W/LOWER EXTREMITY;  Surgeon: Wellington Hampshire, MD;  Location: [place] [facility];  Service: Cardiovascular;  Laterality: Bilateral;  . arm surgery    . PERIPHERAL VASCULAR BALLOON ANGIOPLASTY  08/16/2019   Procedure: PERIPHERAL VASCULAR BALLOON ANGIOPLASTY;  Surgeon: Lorretta Harp, MD;  Location: [place] [facility];  Service: Cardiovascular;;  attempted PTA of Left SFA  . PERIPHERAL VASCULAR INTERVENTION Right 07/16/2019   Procedure: PERIPHERAL VASCULAR INTERVENTION;  Surgeon: Lorretta Harp, MD;  Location: [place] [facility];  Service: Cardiovascular;  Laterality: Right;  . PERIPHERAL VASCULAR INTERVENTION Right 12/19/2019   Procedure: PERIPHERAL VASCULAR INTERVENTION;  Surgeon: Wellington Hampshire, MD;  Location: [place] [facility];  Service: Cardiovascular;  Laterality: Right;  iliac    SocHx  reports that he has been smoking cigarettes. He has a 20.00 pack-year smoking history. He has never used smokeless tobacco. He reports current alcohol use. He reports current drug use. Frequency: 3.00 times per week. Drug: Marijuana.  No Known Allergies  FamHx Family History  Problem Relation Age of Onset  . Cancer Mother   . Diabetes Father     Prior to Admission medications   Medication Sig Start Date End Date Taking? Authorizing Provider  aspirin EC 81 MG EC tablet Take 1 tablet (81 mg total) by mouth daily. Swallow whole. Patient taking differently: Take 81 mg by mouth daily at 2 PM. Swallow whole. 07/18/19  Yes Furth, Cadence H, PA-C  atorvastatin (LIPITOR) 40 MG tablet  Take 1 tablet by mouth at bedtime. 04/06/20  Yes [provider]  Blood Pressure Monitor KIT Check blood pressure daily Patient taking differently: 1 each by Other route once a week. 10/20/18  Yes Jacelyn Pi, Lilia Argue, MD  clopidogrel (PLAVIX) 75 MG tablet Take 1 tablet (75 mg total) by mouth daily with breakfast. 07/18/19  Yes Furth, Cadence H, PA-C  gabapentin  (NEURONTIN) 600 MG tablet Take 600 mg by mouth 3 (three) times daily. 02/26/20  Yes [provider]  lisinopril-hydrochlorothiazide (ZESTORETIC) 20-25 MG tablet Take 1 tablet by mouth daily. 04/16/20  Yes [provider]  SANTYL ointment Apply 1 application topically daily as needed (wound care).  08/15/19  Yes [provider]  SSD 1 % cream Apply 1 application topically daily as needed (wound care).  08/07/19  Yes [provider]  traMADol (ULTRAM) 50 MG tablet Take 50 mg by mouth 3 (three) times daily as needed (pain).  09/07/19  Yes [provider]  atorvastatin (LIPITOR) 80 MG tablet Take 1 tablet (80 mg total) by mouth daily. Patient not taking: No sig reported 07/18/19   Furth, Cadence H, PA-C  ondansetron (ZOFRAN) 4 MG tablet Take 1 tablet (4 mg total) by mouth every 6 (six) hours. Patient not taking: Reported on 06/06/2020 05/10/20   Valarie Merino, MD    Physical Exam: Vitals:   06/06/20 0730 06/06/20 0908 06/06/20 0910  BP: (!) 145/96 (!) 156/92 (!) 156/92  Pulse: 94 85 81  Resp: _0 Temp: 97.9 F (36.6 C)    SpO2: 100% 100% 100%    General: 60 y.o. 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 Liver Function Tests: Recent Labs  Lab 06/06/20 0802  AST 14*  ALT 16  ALKPHOS 68  BILITOT 0.6  PROT 8.0  ALBUMIN 4.2   No results for input(s): LIPASE, AMYLASE in the last 168 hours. No results for input(s): AMMONIA in the last 168 hours. Coagulation Profile: Recent Labs  Lab 06/06/20 0802  INR 1.0   Cardiac Enzymes: No results for input(s): CKTOTAL, CKMB, CKMBINDEX, TROPONINI in the last 168 hours. BNP (last 3 results) No results for input(s): PROBNP in the last 8760 hours. HbA1C: No results for input(s): HGBA1C in the last 72 hours. CBG: No results for input(s): GLUCAP in the last 168 hours. Lipid Profile: No results for input(s): CHOL, HDL, LDLCALC, TRIG, CHOLHDL, LDLDIRECT in the last 72 hours. Thyroid Function Tests: No results for input(s): TSH, T4TOTAL, FREET4, T3FREE, THYROIDAB in the last 72 hours. Anemia  Panel: No results for input(s): VITAMINB12, FOLATE, FERRITIN, TIBC, IRON, RETICCTPCT in the last 72 hours. Urine analysis:    Component Value Date/Time   COLORURINE STRAW (A) 05/10/2020 1722   APPEARANCEUR CLEAR 05/10/2020 1722   LABSPEC 1.008 05/10/2020 1722   PHURINE 8.0 05/10/2020 1722   GLUCOSEU NEGATIVE 05/10/2020 1722   HGBUR NEGATIVE 05/10/2020 1722   BILIRUBINUR NEGATIVE 05/10/2020 1722   KETONESUR 5 (A) 05/10/2020 1722   PROTEINUR NEGATIVE 05/10/2020 1722   NITRITE NEGATIVE 05/10/2020 1722   LEUKOCYTESUR NEGATIVE 05/10/2020 1722    Radiological Exams on Admission: US ARTERIAL SEG MULTIPLE LE (ABI, SEGMENTAL PRESSURES, PVR'S)  Result Date: 06/05/2020 CLINICAL DATA:  Peripheral vascular disease EXAM: NONINVASIVE PHYSIOLOGIC VASCULAR STUDY OF BILATERAL LOWER EXTREMITIES TECHNIQUE: Non-invasive vascular evaluation of both lower extremities was performed at rest, including  calculation of ankle-brachial indices, multiple segmental pressure evaluation, segmental Doppler and segmental pulse volume recording. COMPARISON:  None. FINDINGS: Right Lower Extremity Resting ABI:  1.07 Resting TBI: 0.35 Segmental Pressures: Normal segmental pressures, no significant (20 mmHg) pressure gradient between adjacent segments. Great toe pressure: 49 mm Hg Arterial Waveforms: Monophasic waveform seen in the posterior tibial and dorsalis pedis arteries. PVRs: Normal PVRs with maintained waveform amplitude, augmentation and quality. Left Lower Extremity: Resting ABI: 0.41 Resting TBI: 0.33 Segmental Pressures: Decreased segmental pressures seen throughout the left lower extremity. Great toe pressure: 46 mm Hg Arterial Waveforms: Monophasic waveform seen throughout the left lower extremity. PVRs: Normal PVRs with maintained waveform amplitude, augmentation and quality. Other: Symmetric upper extremity pressures. Ankle Brachial index > 1.4 Non diagnostic secondary to incompressible vessel calcifications 1.0-1.4       Normal 0.9-0.99     Borderline PAD 0.8-0.89     Mild PAD 0.5-0.79     Moderate PAD < 0.5          Severe PAD Toe Brachial Index Normal     >0.65 Moderate  0.53-0.64 Severe     <0.23 Toe Pressures Absolute toe pressure >61mHg sufficient for wound healing. Toe pressures <583mg = critical limb ischemia. IMPRESSION: 1. 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 2. Findings suspicious for significant right lower extremity occlusive disease given moderately decreased TBI. Electronically Signed   By: FaMiachel Roux.D.   On: 06/05/2020 11:34   DG Foot Complete Left  Result Date: 06/06/2020 CLINICAL DATA:  Fifth toe infection EXAM: LEFT FOOT - COMPLETE 3+ VIEW COMPARISON:  May 10, 2020, March 08, 2019 FINDINGS: No acute fracture or dislocation. Unchanged well corticated ossific density at the base of the fifth proximal phalanx consistent with sequela of  remote prior trauma. Mild midfoot degenerative changes. No area of erosion or osseous destruction. No unexpected radiopaque foreign body. No soft tissue air. Vascular calcifications. Bone island of the calcaneus. IMPRESSION: No radiographic evidence of osteomyelitis. 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  Dispo: The patient is from: Home              Anticipated d/c is to: Home              Patient currently is not medically stable to d/c.   Difficult to place patient No  Jonnie Finner DO [facility]  If 7PM-7AM, please contact night-coverage [email or web address]  06/06/2020, 9:38 AM

## 2020-06-06 NOTE — Progress Notes (Signed)
Initial Nutrition Assessment  DOCUMENTATION CODES:  Severe malnutrition in context of chronic illness  INTERVENTION:  Add Ensure Enlive po TID, each supplement provides 350 kcal and 20 grams of protein.  Add Magic cup TID with meals, each supplement provides 290 kcal and 9 grams of protein.  Add 30 ml ProSource Plus po BID, each supplement provides 100 kcal and 15 grams of protein.   Add 1 packet Juven BID, each packet provides 95 calories, 2.5 grams of protein (collagen), and 9.8 grams of carbohydrate (3 grams sugar); also contains 7 grams of L-arginine and L-glutamine, 300 mg vitamin C, 15 mg vitamin E, 1.2 mcg vitamin B-12, 9.5 mg zinc, 200 mg calcium, and 1.5 g  Calcium Beta-hydroxy-Beta-methylbutyrate to support wound healing.  Add MVI with minerals daily.  NUTRITION DIAGNOSIS:  Severe Malnutrition related to chronic illness as evidenced by percent weight loss,severe fat depletion,severe muscle depletion.  GOAL:  Patient will meet greater than or equal to 90% of their needs  MONITOR:  PO intake,Supplement acceptance,Labs,Weight trends,Skin,I & O's  REASON FOR ASSESSMENT:  Consult Assessment of nutrition requirement/status,Wound healing  ASSESSMENT:  60 yo male with a PMH of CAD, HTN, T2DM, HLD, and PAD who presents with possible osteomyelitis in L pinky toe.  Spoke with pt at bedside. Pt reports eating well at home - chicken, grits, eggs, sausage, mac and cheese, spaghetti, etc. He reports having an appetite, but reports it as "okay, it could be better." Pt limited in conversation as interested in watching TV.  Pt reports a 6 lb weight loss in 3 days. Per Epic, pt's weight has fluctuated between 46-49 kg for the last year. Pt now weighs 43.7 kg. Since 02/2020, pt has lost about 10 lbs, or 9% of his body weight, which is significant and severe. Pt reports his UBW as 125 lbs.  On exam, pt severely depleted in all areas. Pt is severely malnourished given above  information.  Recommend adding Ensure Enlive TID, Magic Cup TID, ProSource BID, Juven BID, and MVI with minerals daily.  Medications: reviewed; colace BID, Vicodin, CBG 88-113  Labs: reviewed  NUTRITION - FOCUSED PHYSICAL EXAM: Flowsheet Row Most Recent Value  Orbital Region Severe depletion  Upper Arm Region Severe depletion  Thoracic and Lumbar Region Severe depletion  Buccal Region Severe depletion  Temple Region Severe depletion  Clavicle Bone Region Severe depletion  Clavicle and Acromion Bone Region Severe depletion  Scapular Bone Region Severe depletion  Dorsal Hand Severe depletion  Patellar Region Severe depletion  Anterior Thigh Region Severe depletion  Posterior Calf Region Severe depletion  Edema (RD Assessment) None  Hair Reviewed  Eyes Reviewed  Mouth Reviewed  Skin Reviewed  Nails Reviewed     Diet Order:   Diet Order            Diet Heart Room service appropriate? Yes; Fluid consistency: Thin  Diet effective now                EDUCATION NEEDS:  Education needs have been addressed  Skin:  Skin Assessment: Reviewed RN Assessment  Last BM:  06/05/20  Height:  Ht Readings from Last 1 Encounters:  05/10/20 _0  (1.549 m)   Weight:  Wt Readings from Last 1 Encounters:  06/06/20 43.7 kg   Ideal Body Weight:  51 kg  BMI:  Body mass index is 18.21 kg/m.  Estimated Nutritional Needs:  Kcal:  1700-1900 Protein:  60-75 grams Fluid:  >1.7 L  Derrel Nip, RD, LDN Registered Dietitian  After Hours/Weekend Pager # in [employer]

## 2020-06-06 NOTE — Progress Notes (Signed)
A consult was received from an ED physician for vancomycin and Zosyn per pharmacy dosing.  The patient's profile has been reviewed for ht/wt/allergies/indication/available labs.   A one time order has been placed for Zosyn 3.375 g iv once and vancomycin 1000 mg iv (last weight in Feb 48 kg).  Further antibiotics/pharmacy consults should be ordered by admitting physician if indicated.                       Thank you, Napoleon Form 06/06/2020  7:52 AM

## 2020-06-07 DIAGNOSIS — I70245 Atherosclerosis of native arteries of left leg with ulceration of other part of foot: Secondary | ICD-10-CM

## 2020-06-07 DIAGNOSIS — L03116 Cellulitis of left lower limb: Secondary | ICD-10-CM | POA: Diagnosis present

## 2020-06-07 DIAGNOSIS — M86672 Other chronic osteomyelitis, left ankle and foot: Secondary | ICD-10-CM

## 2020-06-07 DIAGNOSIS — L97519 Non-pressure chronic ulcer of other part of right foot with unspecified severity: Secondary | ICD-10-CM | POA: Diagnosis present

## 2020-06-07 DIAGNOSIS — I2510 Atherosclerotic heart disease of native coronary artery without angina pectoris: Secondary | ICD-10-CM | POA: Diagnosis present

## 2020-06-07 DIAGNOSIS — G8929 Other chronic pain: Secondary | ICD-10-CM | POA: Diagnosis present

## 2020-06-07 DIAGNOSIS — E1152 Type 2 diabetes mellitus with diabetic peripheral angiopathy with gangrene: Secondary | ICD-10-CM | POA: Diagnosis present

## 2020-06-07 DIAGNOSIS — I96 Gangrene, not elsewhere classified: Secondary | ICD-10-CM

## 2020-06-07 DIAGNOSIS — Z79899 Other long term (current) drug therapy: Secondary | ICD-10-CM | POA: Diagnosis not present

## 2020-06-07 DIAGNOSIS — Z7902 Long term (current) use of antithrombotics/antiplatelets: Secondary | ICD-10-CM | POA: Diagnosis not present

## 2020-06-07 DIAGNOSIS — Z681 Body mass index (BMI) 19 or less, adult: Secondary | ICD-10-CM | POA: Diagnosis not present

## 2020-06-07 DIAGNOSIS — Z7984 Long term (current) use of oral hypoglycemic drugs: Secondary | ICD-10-CM | POA: Diagnosis not present

## 2020-06-07 DIAGNOSIS — E785 Hyperlipidemia, unspecified: Secondary | ICD-10-CM | POA: Diagnosis present

## 2020-06-07 DIAGNOSIS — L089 Local infection of the skin and subcutaneous tissue, unspecified: Secondary | ICD-10-CM | POA: Diagnosis not present

## 2020-06-07 DIAGNOSIS — Z20822 Contact with and (suspected) exposure to covid-19: Secondary | ICD-10-CM | POA: Diagnosis present

## 2020-06-07 DIAGNOSIS — M25561 Pain in right knee: Secondary | ICD-10-CM | POA: Diagnosis present

## 2020-06-07 DIAGNOSIS — E1169 Type 2 diabetes mellitus with other specified complication: Secondary | ICD-10-CM | POA: Diagnosis present

## 2020-06-07 DIAGNOSIS — Z7982 Long term (current) use of aspirin: Secondary | ICD-10-CM | POA: Diagnosis not present

## 2020-06-07 DIAGNOSIS — I739 Peripheral vascular disease, unspecified: Secondary | ICD-10-CM

## 2020-06-07 DIAGNOSIS — E43 Unspecified severe protein-calorie malnutrition: Secondary | ICD-10-CM | POA: Diagnosis not present

## 2020-06-07 DIAGNOSIS — M869 Osteomyelitis, unspecified: Secondary | ICD-10-CM | POA: Diagnosis present

## 2020-06-07 DIAGNOSIS — M86172 Other acute osteomyelitis, left ankle and foot: Secondary | ICD-10-CM | POA: Diagnosis present

## 2020-06-07 DIAGNOSIS — Z720 Tobacco use: Secondary | ICD-10-CM

## 2020-06-07 DIAGNOSIS — I70262 Atherosclerosis of native arteries of extremities with gangrene, left leg: Secondary | ICD-10-CM | POA: Diagnosis present

## 2020-06-07 DIAGNOSIS — L97529 Non-pressure chronic ulcer of other part of left foot with unspecified severity: Secondary | ICD-10-CM | POA: Diagnosis present

## 2020-06-07 DIAGNOSIS — F1210 Cannabis abuse, uncomplicated: Secondary | ICD-10-CM | POA: Diagnosis present

## 2020-06-07 DIAGNOSIS — B9689 Other specified bacterial agents as the cause of diseases classified elsewhere: Secondary | ICD-10-CM | POA: Diagnosis present

## 2020-06-07 DIAGNOSIS — E1142 Type 2 diabetes mellitus with diabetic polyneuropathy: Secondary | ICD-10-CM | POA: Diagnosis present

## 2020-06-07 DIAGNOSIS — Z833 Family history of diabetes mellitus: Secondary | ICD-10-CM | POA: Diagnosis not present

## 2020-06-07 DIAGNOSIS — I10 Essential (primary) hypertension: Secondary | ICD-10-CM

## 2020-06-07 DIAGNOSIS — E11621 Type 2 diabetes mellitus with foot ulcer: Secondary | ICD-10-CM | POA: Diagnosis present

## 2020-06-07 DIAGNOSIS — Z01810 Encounter for preprocedural cardiovascular examination: Secondary | ICD-10-CM | POA: Diagnosis not present

## 2020-06-07 DIAGNOSIS — F17210 Nicotine dependence, cigarettes, uncomplicated: Secondary | ICD-10-CM | POA: Diagnosis present

## 2020-06-07 LAB — CBC
HCT: 42.2 % (ref 39.0–52.0)
Hemoglobin: 13 g/dL (ref 13.0–17.0)
MCH: 28.6 pg (ref 26.0–34.0)
MCHC: 30.8 g/dL (ref 30.0–36.0)
MCV: 92.7 fL (ref 80.0–100.0)
Platelets: 291 10*3/uL (ref 150–400)
RBC: 4.55 MIL/uL (ref 4.22–5.81)
RDW: 17 % — ABNORMAL HIGH (ref 11.5–15.5)
WBC: 6.4 10*3/uL (ref 4.0–10.5)
nRBC: 0 % (ref 0.0–0.2)

## 2020-06-07 LAB — COMPREHENSIVE METABOLIC PANEL
ALT: 14 U/L (ref 0–44)
AST: 15 U/L (ref 15–41)
Albumin: 4.1 g/dL (ref 3.5–5.0)
Alkaline Phosphatase: 80 U/L (ref 38–126)
Anion gap: 7 (ref 5–15)
BUN: 29 mg/dL — ABNORMAL HIGH (ref 6–20)
CO2: 28 mmol/L (ref 22–32)
Calcium: 9.7 mg/dL (ref 8.9–10.3)
Chloride: 101 mmol/L (ref 98–111)
Creatinine, Ser: 0.95 mg/dL (ref 0.61–1.24)
GFR, Estimated: >60 mL/min (ref >60)
Glucose, Bld: 120 mg/dL — ABNORMAL HIGH (ref 70–99)
Potassium: 4.4 mmol/L (ref 3.5–5.1)
Sodium: 136 mmol/L (ref 135–145)
Total Bilirubin: 0.6 mg/dL (ref 0.3–1.2)
Total Protein: 7.7 g/dL (ref 6.5–8.1)

## 2020-06-07 MED ORDER — NICOTINE 7 MG/24HR TD PT24
7.0000 mg | MEDICATED_PATCH | Freq: Every day | TRANSDERMAL | Status: DC
  Administered 2020-06-07 – 2020-06-16 (×10): 7 mg via TRANSDERMAL
  Filled 2020-06-07 (×10): qty 1

## 2020-06-07 MED ORDER — SODIUM CHLORIDE 0.9 % IV SOLN: INTRAVENOUS | Status: DC

## 2020-06-07 MED ORDER — NICOTINE 21 MG/24HR TD PT24: 21.0000 mg | MEDICATED_PATCH | Freq: Every day | TRANSDERMAL | Status: DC

## 2020-06-07 NOTE — Progress Notes (Addendum)
Report given to Massie Bougie, RN on RN '@[facility]'$ .

## 2020-06-07 NOTE — Progress Notes (Addendum)
PROGRESS NOTE    [employer].  LF:[id number] DOB: 12-Oct-1960 DOA: 06/06/2020 PCP: Cipriano Mile, NP    Chief Complaint  Patient presents with  . Toe Pain    Brief Narrative:  Patient is a 60 year old gentleman, history of coronary artery disease, hypertension, severe PAD presenting with left foot pain with left fifth toe pain over the past 3 months.  Patient was being followed at the wound care center for his right foot.  Patient stated pain in the left fifth toe noted to have worsened spoke with PCP supposedly imaging was done and based on imaging results referred to the ED.  Patient seen in the ED plain films of the left foot were negative for osteomyelitis.  MRI of the right foot was consistent with osteomyelitis of the fifth left toe.  Patient placed empirically on IV cefepime and Flagyl.  MRSA PCR negative.  Podiatry consulted.  Vascular surgery consulted who recommended transfer to [facility] as patient would likely need further evaluation with angiography.   Assessment & Plan:   Principal Problem:   Osteomyelitis of fifth toe of left foot (HCC) Active Problems:   Toe infection   Essential hypertension, benign   Type 2 diabetes mellitus without complication, without long-term current use of insulin (HCC)   PVD (peripheral vascular disease) (HCC)   Protein-calorie malnutrition, severe   Tobacco use   1  Osteomyelitis of the fifth toe of the left foot -Patient presented with worsening left fifth toe pain, swelling, erythema.  Pain noted to have been ongoing for the past 3 months and worsening. -Patient noted to be following at the wound care center. -Plain films of the left foot negative for osteomyelitis. -MRI of the left foot consistent with osteomyelitis of the fifth toe. -ABIs obtained with severe left lower extremity arterial occlusive disease, significant right lower extremity occlusive disease given moderately decreased TBI. -Patient noted to have had a  peripheral angiography on 07/16/2019 revealing occluded SFAs bilaterally with one-vessel runoff status post directional atherectomy followed by drug-coated balloon angioplasty of right SFA per Dr. Gwenlyn Found with right ABI increasing from 0.60-0.85.  Left SFA CTO 08/16/2019 which was attempted was unsuccessful reentering the distal. -Patient also noted to have right external iliac artery stenting per Dr. Fletcher Anon 12/19/2019. -Patient noted with ongoing tobacco use and states has decreased amount of cigarettes he smokes per day. -Tobacco cessation stressed to patient -Continue empiric IV cefepime, IV Flagyl, IV vancomycin. -Continue aspirin, Lipitor, Plavix. -Patient seen in consultation by podiatry who are recommending vascular evaluation. -Vascular surgery consulted, spoke with Dr. Donzetta Matters who has graciously agreed to assess patient and recommending transfer to [facility] for further evaluation and possible angiography. -Supportive care.  2.  Chronic wound of the right dorsum of the foot -Continue current wound care.  3.  Hyperlipidemia -Continue statin.  4.  Hypertension -Continue ACE inhibitor, HCTZ.  5.  Severe PAD -Continue aspirin, Plavix, statin. -Tobacco cessation.  6.  Tobacco abuse -Patient with ongoing tobacco use states he smokes about 2 cigarettes a day. -Tobacco cessation stressed to patient. -Nicotine patch.  7.  Chronic pain/neuropathy -Gabapentin.  8.  Moderate protein calorie malnutrition -Dietitian consulted -Place on nutritional supplementation    DVT prophylaxis: Lovenox Code Status: Full Family Communication: Updated patient.  No family at bedside. Disposition:   Status is: Inpatient    Dispo: The patient is from: Home              Anticipated d/c is to: To be  determined              Patient currently with left fifth toe osteomyelitis, on IV antibiotics, podiatry and vascular consulted, patient to be transferred to [facility] for further evaluation and  management per vascular surgery.   Difficult to place patient: No       Consultants:   Podiatry  Vascular surgery pending  Procedures:   ABI/TBI 06/05/2020  MRI left foot 06/06/2020  Plain films of the left foot 06/06/2020  Antimicrobials:   IV cefepime 06/06/2020>>>>  IV Flagyl 06/06/2020>>>>>  IV vancomycin 06/06/2020   Subjective: Patient laying in bed watching television.  Complaining of left fifth toe pain.  No chest pain, no shortness of breath, no abdominal pain, no nausea or vomiting.  Objective: Vitals:   06/06/20 2132 06/07/20 0202 06/07/20 0603 06/07/20 1339  BP: (!) 170/98 (!) 163/89 (!) 179/98 (!) 161/91  Pulse: 96 81 95 89  Resp: '18 18 18 18  '$ Temp: 98 F (36.7 C) 98.2 F (36.8 C) 98.6 F (37 C) 98.2 F (36.8 C)  TempSrc: Oral Oral Oral Oral  SpO2: 100% 100% 95% 100%  Weight:        Intake/Output Summary (Last 24 hours) at 06/07/2020 1533 Last data filed at 06/07/2020 1400 Gross per 24 hour  Intake 1760 ml  Output 850 ml  Net 910 ml   Filed Weights   06/06/20 1311  Weight: 43.7 kg    Examination:  General exam: Frail.  NAD.  Respiratory system: Clear to auscultation. Respiratory effort normal. Cardiovascular system: S1 & S2 heard, RRR. No JVD, murmurs, rubs, gallops or clicks. No pedal edema. Gastrointestinal system: Abdomen is nondistended, soft and nontender. No organomegaly or masses felt. Normal bowel sounds heard. Central nervous system: Alert and oriented. No focal neurological deficits. Extremities: Left fifth toe, exquisitely tender to palpation, swelling, erythema. Skin: No rashes, lesions or ulcers Psychiatry: Judgement and insight appear normal. Mood & affect appropriate.     Data Reviewed: I have personally reviewed following labs and imaging studies  CBC: Recent Labs  Lab 06/06/20 0802 06/07/20 0530  WBC 5.4 6.4  NEUTROABS 3.3  --   HGB 13.0 13.0  HCT 41.3 42.2  MCV 91.0 92.7  PLT 320 [id number]    Basic Metabolic  Panel: Recent Labs  Lab 06/06/20 0802 06/07/20 0530  NA 138 136  K 4.0 4.4  CL 103 101  CO2 28 28  GLUCOSE 126* 120*  BUN 28* 29*  CREATININE 1.07 0.95  CALCIUM 9.8 9.7    GFR: Estimated Creatinine Clearance: 51.1 mL/min (by C-G formula based on SCr of 0.95 mg/dL).  Liver Function Tests: Recent Labs  Lab 06/06/20 0802 06/07/20 0530  AST 14* 15  ALT 16 14  ALKPHOS 68 80  BILITOT 0.6 0.6  PROT 8.0 7.7  ALBUMIN 4.2 4.1    CBG: No results for input(s): GLUCAP in the last 168 hours.   Recent Results (from the past 240 hour(s))  Culture, blood (routine x 2)     Status: None (Preliminary result)   Collection Time: 06/06/20  8:02 AM   Specimen: BLOOD  Result Value Ref Range Status   Specimen Description   Final    BLOOD LEFT ANTECUBITAL Performed at [facility] [address] [place], [place] [address]    Special Requests   Final    BOTTLES DRAWN AEROBIC AND ANAEROBIC Blood Culture adequate volume Performed at [facility] [address] [place], [place]  [address]    Culture   Final    NO GROWTH 1 DAY Performed at [facility], [place] [address], [place], [place] [address]    Report Status PENDING  Incomplete  Resp Panel by RT-PCR (Flu A&B, Covid) Nasopharyngeal Swab     Status: None   Collection Time: 06/06/20  8:02 AM   Specimen: Nasopharyngeal Swab; Nasopharyngeal(NP) swabs in vial transport medium  Result Value Ref Range Status   SARS Coronavirus 2 by RT PCR NEGATIVE NEGATIVE Final    Comment: (NOTE) SARS-CoV-2 target nucleic acids are NOT DETECTED.  The SARS-CoV-2 RNA is generally detectable in upper respiratory specimens during the acute phase of infection. The lowest concentration of SARS-CoV-2 viral copies this assay can detect is 138 copies/mL. A negative result does not preclude SARS-Cov-2 infection and should not be used as the sole basis for treatment or other patient management decisions. A negative  result may occur with  improper specimen collection/handling, submission of specimen other than nasopharyngeal swab, presence of viral mutation(s) within the areas targeted by this assay, and inadequate number of viral copies(<138 copies/mL). A negative result must be combined with clinical observations, patient history, and epidemiological information. The expected result is Negative.  Fact Sheet for Patients:  EntrepreneurPulse.com.au  Fact Sheet for Healthcare Providers:  IncredibleEmployment.be  This test is no t yet approved or cleared by the [place] FDA and  has been authorized for detection and/or diagnosis of SARS-CoV-2 by FDA under an Emergency Use Authorization (EUA). This EUA will remain  in effect (meaning this test can be used) for the duration of the COVID-19 declaration under Section 564(b)(1) of the Act, 21 U.S.C.section 360bbb-3(b)(1), unless the authorization is terminated  or revoked sooner.       Influenza A by PCR NEGATIVE NEGATIVE Final   Influenza B by PCR NEGATIVE NEGATIVE Final    Comment: (NOTE) The Xpert Xpress SARS-CoV-2/FLU/RSV plus assay is intended as an aid in the diagnosis of influenza from Nasopharyngeal swab specimens and should not be used as a sole basis for treatment. Nasal washings and aspirates are unacceptable for Xpert Xpress SARS-CoV-2/FLU/RSV testing.  Fact Sheet for Patients: EntrepreneurPulse.com.au  Fact Sheet for Healthcare Providers: IncredibleEmployment.be  This test is not yet approved or cleared by the [place] FDA and has been authorized for detection and/or diagnosis of SARS-CoV-2 by FDA under an Emergency Use Authorization (EUA). This EUA will remain in effect (meaning this test can be used) for the duration of the COVID-19 declaration under Section 564(b)(1) of the Act, 21 U.S.C. section 360bbb-3(b)(1), unless the authorization is  terminated or revoked.  Performed at [facility], [place] [address], [place], [place] [address]   Culture, blood (routine x 2)     Status: None (Preliminary result)   Collection Time: 06/06/20  8:17 AM   Specimen: BLOOD  Result Value Ref Range Status   Specimen Description   Final    BLOOD LEFT ANTECUBITAL Performed at [facility] [address] [place], [place] [address]    Special Requests   Final    BOTTLES DRAWN AEROBIC AND ANAEROBIC Blood Culture adequate volume Performed at [facility] [address], [place], [place] [address]    Culture   Final    NO GROWTH 1 DAY Performed at [facility], [place] [address] [place], [place] [address]    Report Status PENDING  Incomplete         Radiology Studies: MR  FOOT LEFT WO CONTRAST  Result Date: 06/06/2020 CLINICAL DATA:  Pain and swelling of the fifth toe. EXAM: MRI OF THE LEFT FOOT WITHOUT CONTRAST TECHNIQUE: Multiplanar, multisequence MR imaging of the left foot was performed. No intravenous contrast was administered. COMPARISON:  Radiographs 06/06/2020 FINDINGS: Examination is quite limited due to patient motion and poor distal fat saturation. However, there is abnormal T1 and T2 signal intensity in the fused middle and distal phalanges of the fifth toe highly suspicious for osteomyelitis. There is also surrounding soft tissue swelling/edema and fluid in the subcutaneous tissues suspicious for cellulitis and small abscess. I do not see any other definite sites of osteomyelitis and no findings suspicious for septic arthritis. No findings to suggest myofasciitis or pyomyositis. The major tendons and ligaments appear intact. IMPRESSION: 1. Findings highly suspicious for osteomyelitis involving the fused middle and distal phalanges of the fifth toe. 2. Cellulitis and suspected small subcutaneous abscess involving the fifth toe. 3. No findings for myofasciitis or pyomyositis.  Electronically Signed   By: Marijo Sanes M.D.   On: 06/06/2020 13:19   DG Foot Complete Left  Result Date: 06/06/2020 CLINICAL DATA:  Fifth toe infection EXAM: LEFT FOOT - COMPLETE 3+ VIEW COMPARISON:  May 10, 2020, March 08, 2019 FINDINGS: No acute fracture or dislocation. Unchanged well corticated ossific density at the base of the fifth proximal phalanx consistent with sequela of remote prior trauma. Mild midfoot degenerative changes. No area of erosion or osseous destruction. No unexpected radiopaque foreign body. No soft tissue air. Vascular calcifications. Bone island of the calcaneus. IMPRESSION: No radiographic evidence of osteomyelitis. Electronically Signed   By: Valentino Saxon MD   On: 06/06/2020 08:40        Scheduled Meds: . (feeding supplement) PROSource Plus  30 mL Oral BID BM  . aspirin EC  81 mg Oral Q1400  . atorvastatin  40 mg Oral QHS  . clopidogrel  75 mg Oral Q breakfast  . collagenase   Topical Daily  . docusate sodium  100 mg Oral BID  . enoxaparin (LOVENOX) injection  30 mg Subcutaneous Daily  . feeding supplement  237 mL Oral TID BM  . gabapentin  600 mg Oral TID  . lisinopril  20 mg Oral Daily   And  . hydrochlorothiazide  25 mg Oral Daily  . multivitamin with minerals  1 tablet Oral Daily  . nicotine  7 mg Transdermal Daily  . nutrition supplement (JUVEN)  1 packet Oral BID BM   Continuous Infusions: . sodium chloride 100 mL/hr at 06/07/20 0822  . ceFEPime (MAXIPIME) IV 2 g (06/07/20 0246)  . metronidazole 500 mg (06/07/20 0630)  . vancomycin 750 mg (06/07/20 0828)     LOS: 0 days    Time spent: 35 minutes    Irine Seal, MD [facility]   To contact the attending provider between 7A-7P or the covering provider during after hours 7P-7A, please log into the web site [email or web address] and access using universal [place] password for that web site. If you do not have the password, please call the hospital  operator.  06/07/2020, 3:33 PM

## 2020-06-07 NOTE — Consult Note (Signed)
Hospital Consult    Reason for Consult: Left 5th toe ulceration Referring Physician: Dr. Grandville Silos MRN #:  [id number]  History of Present Illness: This is a 60 y.o. male history of right SFA arthrectomy and drug-coated balloon for chronic limb threatening ischemia in June 2021.  He also has drug-eluting stent of the right common external iliac arteries.  He subsequently underwent attempted crossing of left SFA occlusion for claudication this could not be crossed.  He now presents with ulceration of the left small toe.  He states that the left small toe hurts.  He has a wound on the dorsum of the right foot that he states is healing other than itching it does not cause any problems.  He denies any fevers or chills at this time.  Risk factors for vascular disease include diabetes, hyperlipidemia, hypertension, current every day tobacco abuse.  He is on aspirin, Plavix and a statin as an outpatient.  Past Medical History:  Diagnosis Date  . Diabetes mellitus without complication ([place])   . Hyperlipidemia   . Hypertension   . Peripheral vascular disease ([place])   . Tobacco use     Past Surgical History:  Procedure Laterality Date  . ABDOMINAL AORTOGRAM W/LOWER EXTREMITY Left 07/16/2019   Procedure: ABDOMINAL AORTOGRAM W/LOWER EXTREMITY;  Surgeon: Lorretta Harp, MD;  Location: [facility];  Service: Cardiovascular;  Laterality: Left;  . ABDOMINAL AORTOGRAM W/LOWER EXTREMITY Left 08/16/2019  . ABDOMINAL AORTOGRAM W/LOWER EXTREMITY N/A 08/16/2019   Procedure: ABDOMINAL AORTOGRAM W/LOWER EXTREMITY;  Surgeon: Lorretta Harp, MD;  Location: [facility];  Service: Cardiovascular;  Laterality: N/A;  . ABDOMINAL AORTOGRAM W/LOWER EXTREMITY Bilateral 12/19/2019   Procedure: ABDOMINAL AORTOGRAM W/LOWER EXTREMITY;  Surgeon: Wellington Hampshire, MD;  Location: [facility];  Service: Cardiovascular;  Laterality: Bilateral;  . arm surgery    . PERIPHERAL VASCULAR BALLOON ANGIOPLASTY   08/16/2019   Procedure: PERIPHERAL VASCULAR BALLOON ANGIOPLASTY;  Surgeon: Lorretta Harp, MD;  Location: [facility];  Service: Cardiovascular;;  attempted PTA of Left SFA  . PERIPHERAL VASCULAR INTERVENTION Right 07/16/2019   Procedure: PERIPHERAL VASCULAR INTERVENTION;  Surgeon: Lorretta Harp, MD;  Location: [facility];  Service: Cardiovascular;  Laterality: Right;  . PERIPHERAL VASCULAR INTERVENTION Right 12/19/2019   Procedure: PERIPHERAL VASCULAR INTERVENTION;  Surgeon: Wellington Hampshire, MD;  Location: [facility];  Service: Cardiovascular;  Laterality: Right;  iliac    No Known Allergies  Prior to Admission medications   Medication Sig Start Date End Date Taking? Authorizing Provider  aspirin EC 81 MG EC tablet Take 1 tablet (81 mg total) by mouth daily. Swallow whole. Patient taking differently: Take 81 mg by mouth daily at 2 PM. Swallow whole. 07/18/19  Yes Furth, Cadence H, PA-C  atorvastatin (LIPITOR) 40 MG tablet Take 1 tablet by mouth at bedtime. 04/06/20  Yes [provider]  Blood Pressure Monitor KIT Check blood pressure daily Patient taking differently: 1 each by Other route once a week. 10/20/18  Yes Jacelyn Pi, Lilia Argue, MD  clopidogrel (PLAVIX) 75 MG tablet Take 1 tablet (75 mg total) by mouth daily with breakfast. 07/18/19  Yes Furth, Cadence H, PA-C  gabapentin (NEURONTIN) 600 MG tablet Take 600 mg by mouth 3 (three) times daily. 02/26/20  Yes [provider]  lisinopril-hydrochlorothiazide (ZESTORETIC) 20-25 MG tablet Take 1 tablet by mouth daily. 04/16/20  Yes [provider]  SANTYL ointment Apply 1 application topically daily as needed (  wound care).  08/15/19  Yes [provider]  SSD 1 % cream Apply 1 application topically daily as needed (wound care).  08/07/19  Yes [provider]  traMADol (ULTRAM) 50 MG tablet Take 50 mg by mouth 3 (three) times daily as needed (pain).  09/07/19  Yes [provider]  atorvastatin (LIPITOR) 80 MG tablet Take 1 tablet (80 mg total) by mouth daily. Patient not taking: No sig reported 07/18/19   Furth, Cadence H, PA-C  ondansetron (ZOFRAN) 4 MG tablet Take 1 tablet (4 mg total) by mouth every 6 (six) hours. Patient not taking: Reported on 06/06/2020 05/10/20   Valarie Merino, MD    Social History   Socioeconomic History  . Marital status: Legally Separated    Spouse name: Not on file  . Number of children: Not on file  . Years of education: Not on file  . Highest education level: Not on file  Occupational History  . Not on file  Tobacco Use  . Smoking status: Current Every Day Smoker    Packs/day: 0.50    Years: 40.00    Pack years: 20.00    Types: Cigarettes  . Smokeless tobacco: Never Used  Vaping Use  . Vaping Use: Never used  Substance and Sexual Activity  . Alcohol use: Yes    Comment: beer and liquor daily  . Drug use: Yes    Frequency: 3.0 times per week    Types: Marijuana  . Sexual activity: Not on file  Other Topics Concern  . Not on file  Social History Narrative  . Not on file   Social Determinants of Health   Financial Resource Strain: Not on file  Food Insecurity: Not on file  Transportation Needs: Not on file  Physical Activity: Not on file  Stress: Not on file  Social Connections: Not on file  Intimate Partner Violence: Not on file     Family History  Problem Relation Age of Onset  . Cancer Mother   . Diabetes Father     ROS:  Cardiovascular: _0  chest pain/pressure _1  palpitations _2  SOB lying flat _3  DOE _4  pain in legs while walking _5  pain in legs at rest _6  pain in legs at night _7  non-healing ulcers _8  hx of DVT _9  swelling in legs  Pulmonary: _10  productive cough _11  asthma/wheezing _12  home O2  Neurologic: _13  weakness in _14  arms _15  legs _16  numbness in _17  arms _18  legs _19  hx of CVA _20  mini stroke _21 difficulty speaking or slurred speech _22  temporary loss of vision in one  eye _23  dizziness  Hematologic: _24  hx of cancer _25  bleeding problems _26  problems with blood clotting easily  Endocrine:   _27  diabetes _28  thyroid disease  GI _29  vomiting blood _30  blood in stool  GU: _31  CKD/renal failure _32  HD--_33  M/W/F or _34  T/T/S _35  burning with urination _36  blood in urine  Psychiatric: _37  anxiety _38  depression  Musculoskeletal: _39  arthritis _40  joint pain  Integumentary: _41  rashes _42  ulcers  Constitutional: _43  fever _44  chills   Physical Examination  Vitals:   06/07/20 0603 06/07/20 1339  BP: (!) 179/98 (!) 161/91  Pulse: 95 89  Resp: 18 18  Temp: 98.6 F (37 C) 98.2 F (36.8 C)  SpO2: 95% 100%   Body mass index is 18.21 kg/m.  General:  nad HENT: WNL, normocephalic Pulmonary: normal non-labored breathing Cardiac: Bilateral common femoral pulses are palpable 1+ palpable popliteal on the right no palpable on the left Cannot  palpate pedal pulses bilaterally Abdomen:  soft, NT/ND, no masses Extremities: Dry ulceration appears to be granulating on the dorsum of the right foot Left fifth toe dry gangrene Neurologic: A&O X 3; Appropriate Affect ; SENSATION: normal; MOTOR FUNCTION:  moving all extremities equally. Speech is fluent/normal  CBC    Component Value Date/Time   WBC 6.4 06/07/2020 0530   RBC 4.55 06/07/2020 0530   HGB 13.0 06/07/2020 0530   HGB 11.7 (L) 12/12/2019 1006   HCT 42.2 06/07/2020 0530   HCT 36.2 (L) 12/12/2019 1006   PLT 291 06/07/2020 0530   PLT 249 12/12/2019 1006   MCV 92.7 06/07/2020 0530   MCV 90 12/12/2019 1006   MCH 28.6 06/07/2020 0530   MCHC 30.8 06/07/2020 0530   RDW 17.0 (H) 06/07/2020 0530   RDW 14.5 12/12/2019 1006   LYMPHSABS 1.2 06/06/2020 0802   LYMPHSABS 1.5 07/31/2019 1401   MONOABS 0.7 06/06/2020 0802   EOSABS 0.1 06/06/2020 0802   EOSABS 0.2 07/31/2019 1401   BASOSABS 0.0 06/06/2020 0802   BASOSABS 0.0 07/31/2019 1401    BMET    Component Value Date/Time   NA 136 06/07/2020  0530   NA 136 12/12/2019 1006   K 4.4 06/07/2020 0530   CL 101 06/07/2020 0530   CO2 28 06/07/2020 0530   GLUCOSE 120 (H) 06/07/2020 0530   BUN 29 (H) 06/07/2020 0530   BUN 18 12/12/2019 1006   CREATININE 0.95 06/07/2020 0530   CALCIUM 9.7 06/07/2020 0530   GFRNONAA >60 06/07/2020 0530   GFRAA 82 12/12/2019 1006    COAGS: Lab Results  Component Value Date   INR 1.0 06/06/2020     Non-Invasive Vascular Imaging:   Right Lower Extremity  Resting ABI:  1.07  Resting TBI: 0.35   Left Lower Extremity:  Resting ABI: 0.41  Resting TBI: 0.33   ASSESSMENT/PLAN: This is a 60 y.o. male with history of peripheral arterial disease previous right lower extremity interventions and previous evaluation of the left lower extremity with attempted intervention.  He has severely depressed ABIs with chronic limb threatening ischemia.  He is a patient of Dr. Gwenlyn Found but I was specifically requested by the primary team to evaluate the patient for further options either endovascular or open.  I discussed these with the patient he demonstrates good understanding.  He needs to be transferred to [facility] where we can plan for angiography early next week with possible intervention versus possible surgery as discussed.  I will order vein mapping preprocedure.  Bazil Dhanani C. Donzetta Matters, MD [facility] Office: [phone] Pager: [phone]

## 2020-06-07 NOTE — Plan of Care (Signed)
°  Problem: Activity: °Goal: Ability to avoid complications of mobility impairment will improve °Outcome: Progressing °Goal: Ability to tolerate increased activity will improve °Outcome: Progressing °  °Problem: Education: °Goal: Verbalization of understanding the information provided will improve °Outcome: Progressing °  °

## 2020-06-08 ENCOUNTER — Inpatient Hospital Stay (HOSPITAL_COMMUNITY): Payer: Medicare Other

## 2020-06-08 DIAGNOSIS — Z01810 Encounter for preprocedural cardiovascular examination: Secondary | ICD-10-CM

## 2020-06-08 DIAGNOSIS — M869 Osteomyelitis, unspecified: Secondary | ICD-10-CM | POA: Diagnosis not present

## 2020-06-08 DIAGNOSIS — I70245 Atherosclerosis of native arteries of left leg with ulceration of other part of foot: Secondary | ICD-10-CM | POA: Diagnosis not present

## 2020-06-08 LAB — CBC WITH DIFFERENTIAL/PLATELET
Abs Immature Granulocytes: 0.02 10*3/uL (ref 0.00–0.07)
Basophils Absolute: 0 10*3/uL (ref 0.0–0.1)
Basophils Relative: 0 %
Eosinophils Absolute: 0.2 10*3/uL (ref 0.0–0.5)
Eosinophils Relative: 3 %
HCT: 37.3 % — ABNORMAL LOW (ref 39.0–52.0)
Hemoglobin: 11.9 g/dL — ABNORMAL LOW (ref 13.0–17.0)
Immature Granulocytes: 0 %
Lymphocytes Relative: 18 %
Lymphs Abs: 1 10*3/uL (ref 0.7–4.0)
MCH: 28.8 pg (ref 26.0–34.0)
MCHC: 31.9 g/dL (ref 30.0–36.0)
MCV: 90.3 fL (ref 80.0–100.0)
Monocytes Absolute: 0.8 10*3/uL (ref 0.1–1.0)
Monocytes Relative: 15 %
Neutro Abs: 3.4 10*3/uL (ref 1.7–7.7)
Neutrophils Relative %: 64 %
Platelets: 266 10*3/uL (ref 150–400)
RBC: 4.13 MIL/uL — ABNORMAL LOW (ref 4.22–5.81)
RDW: 16.6 % — ABNORMAL HIGH (ref 11.5–15.5)
WBC: 5.4 10*3/uL (ref 4.0–10.5)
nRBC: 0 % (ref 0.0–0.2)

## 2020-06-08 LAB — BASIC METABOLIC PANEL
Anion gap: 8 (ref 5–15)
BUN: 26 mg/dL — ABNORMAL HIGH (ref 6–20)
CO2: 23 mmol/L (ref 22–32)
Calcium: 9.5 mg/dL (ref 8.9–10.3)
Chloride: 103 mmol/L (ref 98–111)
Creatinine, Ser: 0.83 mg/dL (ref 0.61–1.24)
GFR, Estimated: >60 mL/min (ref >60)
Glucose, Bld: 107 mg/dL — ABNORMAL HIGH (ref 70–99)
Potassium: 3.9 mmol/L (ref 3.5–5.1)
Sodium: 134 mmol/L — ABNORMAL LOW (ref 135–145)

## 2020-06-08 LAB — MAGNESIUM: Magnesium: 2.1 mg/dL (ref 1.7–2.4)

## 2020-06-08 MED ORDER — SODIUM CHLORIDE 0.9 % IV SOLN: INTRAVENOUS | Status: DC

## 2020-06-08 NOTE — TOC Initial Note (Signed)
Transition of Care (TOC) - Initial/Assessment Note    Patient Details  Name: [name]. MRN: [id number] Date of Birth: April 03, 1960  Transition of [facility]) CM/SW Contact:    Sharin Mons, RN Phone Number: 06/08/2020, 5:19 PM  Clinical Narrative:                 Presents with c/o toe pain/ ? osteomyelitis in the left foot / left 5th toe gangrenous  .NCM spoke with pt by phone regarding d/c planning. Pt states he is rom home with a roommate. States has supportive family who can assist with care post d/c if needed. PTA independent with ADL's. Uses cane intermittently. [id number], [facility]  Per vascular, tentative plan angio on ? Monday ( if can get on schedule) or Tuesday.   TOC team following and will assist with TOC needs.  Expected Discharge Plan: Home/Self Care Barriers to Discharge: Continued Medical Work up   Patient Goals and CMS Choice Patient states their goals for this hospitalization and ongoing recovery are:: to get better      Expected Discharge Plan and Services Expected Discharge Plan: Home/Self Care   Discharge Planning Services: CM Consult                                          Prior Living Arrangements/Services   Lives with:: Roommate Patient language and need for interpreter reviewed:: Yes Do you feel safe going back to the place where you live?: Yes      Need for Family Participation in Patient Care: Yes (Comment) Care giver support system in place?: Yes (comment)   Criminal Activity/Legal Involvement Pertinent to Current Situation/Hospitalization: No - Comment as needed  Activities of Daily Living Home Assistive Devices/Equipment: Cane (specify quad or straight) ADL Screening (condition at time of admission) Patient's cognitive ability adequate to safely complete daily activities?: Yes Is the patient deaf or have difficulty hearing?: No Does the patient have difficulty seeing, even when wearing glasses/contacts?:  No Does the patient have difficulty concentrating, remembering, or making decisions?: No Patient able to express need for assistance with ADLs?: Yes Does the patient have difficulty dressing or bathing?: No Independently performs ADLs?: Yes (appropriate for developmental age) Does the patient have difficulty walking or climbing stairs?: No Weakness of Legs: None Weakness of Arms/Hands: None  Permission Sought/Granted   Permission granted to share information with : Yes, Verbal Permission Granted              Emotional Assessment Appearance:: Appears stated age Attitude/Demeanor/Rapport: Engaged Affect (typically observed): Accepting Orientation: : Oriented to Self,Oriented to Place,Oriented to  Time,Oriented to Situation Alcohol / Substance Use: Tobacco Use Psych Involvement: No (comment)  Admission diagnosis:  Right knee pain [M25.561] Toe infection [L08.9] Left foot infection [L08.9] Patient Active Problem List   Diagnosis Date Noted  . Protein-calorie malnutrition, severe 06/07/2020  . Osteomyelitis of fifth toe of left foot ([place]) 06/07/2020  . Tobacco use   . Toe infection 06/06/2020  . PVD (peripheral vascular disease) ([place]) 12/19/2019  . Claudication in peripheral vascular disease ([place]) 08/16/2019  . Critical ischemia of foot ([place]) 07/16/2019  . Critical lower limb ischemia ([place]) 07/10/2019  . Pure hypercholesterolemia 10/13/2018  . Type 2 diabetes mellitus without complication, without long-term current use of insulin ([place]) 10/13/2018  . Tobacco use disorder 10/13/2018  . Essential hypertension, benign 10/23/2009   PCP:  Cipriano Mile, NP Pharmacy:   [facility] [place], [place] [address] Phone: [phone] Fax: [phone]  [facility] [address] [place] [address] Phone: [phone] Fax:  [phone]     Social Determinants of Health (SDOH) Interventions    Readmission Risk Interventions No flowsheet data found.

## 2020-06-08 NOTE — Progress Notes (Signed)
VASCULAR LAB    Bilateral lower extremity vein mapping has been performed.  See CV proc for preliminary results.   Erick Oxendine, RVT 06/08/2020, 4:00 PM

## 2020-06-08 NOTE — Progress Notes (Signed)
PROGRESS NOTE    [employer].  [id number] DOB: 1960-12-17 DOA: 06/06/2020 PCP: Cipriano Mile, NP  Outpatient Specialists:   Brief Narrative:  As per H&P done on admission: "[name]. is a 60 y.o. male with medical history significant of CAD, HTN, PAD. Presenting with left foot pain. Reports he's had pain in the left pinky toe for 3 months. He has been following with wound care for a wound in his right foot. He says that over the last several days, the pain in his left pinky toe has worsened. It's sharp and constant. He spoke with his PCP about it a couple days ago. He was sent for imaging of the foot, but can not tell me where. He states based on that imaging result, he was referred to the ED for evaluation. He denies any other aggravating or alleviating factors.    ED Course: He was started con vanc/zosyn. Foot XR did not show osteo. MRI foot ordered. TRH called for admission".  06/08/2020: Patient has been evaluated by the podiatrist and the vascular surgery team.  Vascular surgery team plans further vascular work-up tomorrow or the day after tomorrow.  Will discontinue HCTZ and, start patient IV fluids normal saline 45 cc/h as patient will likely be exposed to contrast dye (i.e. to prevent contrast dye associated nephropathy).  Podiatry team is of the opinion that patient would mostly need vascular surgery team as left BKA was most likely.  Further management will depend on hospital course.   Assessment & Plan:   Principal Problem:   Osteomyelitis of fifth toe of left foot (HCC) Active Problems:   Essential hypertension, benign   Type 2 diabetes mellitus without complication, without long-term current use of insulin (HCC)   PVD (peripheral vascular disease) (HCC)   Toe infection   Protein-calorie malnutrition, severe   Tobacco use  Left peripheral vascular disease, severe/Pain in left foot, fifth digit/osteomyelitis left fifth toe:     -Admitted with severe  PAD, with discoloration and pain of left fifth toe      - pain has been present for 3 months; he follows /w wound care     -MRI of the left foot revealed:         1. Findings highly suspicious for osteomyelitis involving the fused             middle and distal phalanges of the fifth toe.         2. Cellulitis and suspected small subcutaneous abscess involving the               fifth toe.          3. No findings for myofasciitis or pyomyositis.  -Patient is currently on IV cefepime and Flagyl. -Vascular surgery team has been consulted. -For further vascular work-up. -Patient may end up undergoing left below-knee amputation.    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  Neuropathy Chronic pain     - continue home gabapentin  Moderate protein-calorie malnutrition     - appears malnourished     - check pre-albumin     - get dietary consult    DVT prophylaxis: Subcutaneous Lovenox Code Status: Full code Family Communication:  Disposition Plan: This will depend on hospital course  Consultants:   Vascular surgery  Podiatry  Procedures:   None for now.  Vascular work-up is anticipated in the next 24 to 48 hours as per vascular surgery team.  Antimicrobials:   IV cefepime.  IV Flagyl   Subjective: Patient continues to report left foot pain  Objective: Vitals:   06/07/20 1836 06/07/20 2117 06/08/20 0553 06/08/20 0743  BP: 96/73 102/64 114/73 111/73  Pulse:  80 87 86  Resp:  '18 18 18  '$ Temp:  97.7 F (36.5 C) (!) 97.5 F (36.4 C) 98 F (36.7 C)  TempSrc:  Oral Oral   SpO2:  100% 100% 100%  Weight:       No intake or output data in the 24 hours ending 06/08/20 1452 Filed Weights   06/06/20 1311  Weight: 43.7 kg    Examination:  General exam: Appears calm and comfortable.  Patient is thin/cachectic. Respiratory system:  Clear to auscultation. Respiratory effort normal. Cardiovascular system: S1 & S2 heard Gastrointestinal system: Abdomen is nondistended, soft and nontender. No organomegaly or masses felt. Normal bowel sounds heard. Central nervous system: Alert and oriented. No focal neurological deficits. Extremities: Discoloration of left foot, with dark left first and fifth toes.  Data Reviewed: I have personally reviewed following labs and imaging studies  CBC: Recent Labs  Lab 06/06/20 0802 06/07/20 0530 06/08/20 0242  WBC 5.4 6.4 5.4  NEUTROABS 3.3  --  3.4  HGB 13.0 13.0 11.9*  HCT 41.3 42.2 37.3*  MCV 91.0 92.7 90.3  PLT 320 291 [id number]   Basic Metabolic Panel: Recent Labs  Lab 06/06/20 0802 06/07/20 0530  NA 138 136  K 4.0 4.4  CL 103 101  CO2 28 28  GLUCOSE 126* 120*  BUN 28* 29*  CREATININE 1.07 0.95  CALCIUM 9.8 9.7   GFR: Estimated Creatinine Clearance: 51.1 mL/min (by C-G formula based on SCr of 0.95 mg/dL). Liver Function Tests: Recent Labs  Lab 06/06/20 0802 06/07/20 0530  AST 14* 15  ALT 16 14  ALKPHOS 68 80  BILITOT 0.6 0.6  PROT 8.0 7.7  ALBUMIN 4.2 4.1   No results for input(s): LIPASE, AMYLASE in the last 168 hours. No results for input(s): AMMONIA in the last 168 hours. Coagulation Profile: Recent Labs  Lab 06/06/20 0802  INR 1.0   Cardiac Enzymes: No results for input(s): CKTOTAL, CKMB, CKMBINDEX, TROPONINI in the last 168 hours. BNP (last 3 results) No results for input(s): PROBNP in the last 8760 hours. HbA1C: No results for input(s): HGBA1C in the last 72 hours. CBG: No results for input(s): GLUCAP in the last 168 hours. Lipid Profile: No results for input(s): CHOL, HDL, LDLCALC, TRIG, CHOLHDL, LDLDIRECT in the last 72 hours. Thyroid Function Tests: No results for input(s): TSH, T4TOTAL, FREET4, T3FREE, THYROIDAB in the last 72 hours. Anemia Panel: No results for input(s): VITAMINB12, FOLATE, FERRITIN, TIBC, IRON, RETICCTPCT in the last 72  hours. Urine analysis:    Component Value Date/Time   COLORURINE STRAW (A) 05/10/2020 1722   APPEARANCEUR CLEAR 05/10/2020 1722   LABSPEC 1.008 05/10/2020 1722   PHURINE 8.0 05/10/2020 1722   GLUCOSEU NEGATIVE 05/10/2020 1722   HGBUR NEGATIVE 05/10/2020 1722   BILIRUBINUR NEGATIVE 05/10/2020 1722   KETONESUR 5 (A) 05/10/2020 1722   PROTEINUR NEGATIVE 05/10/2020 1722   NITRITE NEGATIVE 05/10/2020 1722   LEUKOCYTESUR NEGATIVE 05/10/2020 1722   Sepsis Labs: '@LABRCNTIP'$ (procalcitonin:4,lacticidven:4)  ) Recent Results (from the past 240 hour(s))  Culture, blood (routine x 2)  Status: None (Preliminary result)   Collection Time: 06/06/20  8:02 AM   Specimen: BLOOD  Result Value Ref Range Status   Specimen Description   Final    BLOOD LEFT ANTECUBITAL Performed at [facility] [address], [place], [place] [address]    Special Requests   Final    BOTTLES DRAWN AEROBIC AND ANAEROBIC Blood Culture adequate volume Performed at [facility] [address], [place], [place] [address]    Culture   Final    NO GROWTH 2 DAYS Performed at [facility] [address], [place], [place] [address]    Report Status PENDING  Incomplete  Resp Panel by RT-PCR (Flu A&B, Covid) Nasopharyngeal Swab     Status: None   Collection Time: 06/06/20  8:02 AM   Specimen: Nasopharyngeal Swab; Nasopharyngeal(NP) swabs in vial transport medium  Result Value Ref Range Status   SARS Coronavirus 2 by RT PCR NEGATIVE NEGATIVE Final    Comment: (NOTE) SARS-CoV-2 target nucleic acids are NOT DETECTED.  The SARS-CoV-2 RNA is generally detectable in upper respiratory specimens during the acute phase of infection. The lowest concentration of SARS-CoV-2 viral copies this assay can detect is 138 copies/mL. A negative result does not preclude SARS-Cov-2 infection and should not be used as the sole basis for treatment or other patient management decisions. A  negative result may occur with  improper specimen collection/handling, submission of specimen other than nasopharyngeal swab, presence of viral mutation(s) within the areas targeted by this assay, and inadequate number of viral copies(<138 copies/mL). A negative result must be combined with clinical observations, patient history, and epidemiological information. The expected result is Negative.  Fact Sheet for Patients:  EntrepreneurPulse.com.au  Fact Sheet for Healthcare Providers:  IncredibleEmployment.be  This test is no t yet approved or cleared by the [place] FDA and  has been authorized for detection and/or diagnosis of SARS-CoV-2 by FDA under an Emergency Use Authorization (EUA). This EUA will remain  in effect (meaning this test can be used) for the duration of the COVID-19 declaration under Section 564(b)(1) of the Act, 21 U.S.C.section 360bbb-3(b)(1), unless the authorization is terminated  or revoked sooner.       Influenza A by PCR NEGATIVE NEGATIVE Final   Influenza B by PCR NEGATIVE NEGATIVE Final    Comment: (NOTE) The Xpert Xpress SARS-CoV-2/FLU/RSV plus assay is intended as an aid in the diagnosis of influenza from Nasopharyngeal swab specimens and should not be used as a sole basis for treatment. Nasal washings and aspirates are unacceptable for Xpert Xpress SARS-CoV-2/FLU/RSV testing.  Fact Sheet for Patients: EntrepreneurPulse.com.au  Fact Sheet for Healthcare Providers: IncredibleEmployment.be  This test is not yet approved or cleared by the [place] FDA and has been authorized for detection and/or diagnosis of SARS-CoV-2 by FDA under an Emergency Use Authorization (EUA). This EUA will remain in effect (meaning this test can be used) for the duration of the COVID-19 declaration under Section 564(b)(1) of the Act, 21 U.S.C. section 360bbb-3(b)(1), unless the authorization  is terminated or revoked.  Performed at [facility], [place] [address] [place],  [address]   Culture, blood (routine x 2)     Status: None (Preliminary result)   Collection Time: 06/06/20  8:17 AM   Specimen: BLOOD  Result Value Ref Range Status   Specimen Description   Final    BLOOD LEFT ANTECUBITAL Performed at [facility] [address] [place], [place]  [address]    Special Requests   Final    BOTTLES DRAWN AEROBIC AND ANAEROBIC Blood Culture adequate volume Performed at [facility] [address], [place], [place] [address]    Culture   Final    NO GROWTH 2 DAYS Performed at [facility] [address], [place], [place] [address]    Report Status PENDING  Incomplete         Radiology Studies: No results found.      Scheduled Meds: . (feeding supplement) PROSource Plus  30 mL Oral BID BM  . aspirin EC  81 mg Oral Q1400  . atorvastatin  40 mg Oral QHS  . clopidogrel  75 mg Oral Q breakfast  . collagenase   Topical Daily  . docusate sodium  100 mg Oral BID  . enoxaparin (LOVENOX) injection  30 mg Subcutaneous Daily  . feeding supplement  237 mL Oral TID BM  . gabapentin  600 mg Oral TID  . lisinopril  20 mg Oral Daily   And  . hydrochlorothiazide  25 mg Oral Daily  . multivitamin with minerals  1 tablet Oral Daily  . nicotine  7 mg Transdermal Daily  . nutrition supplement (JUVEN)  1 packet Oral BID BM   Continuous Infusions: . sodium chloride 100 mL/hr at 06/07/20 1810  . ceFEPime (MAXIPIME) IV 2 g (06/08/20 0655)  . metronidazole 500 mg (06/08/20 1219)  . vancomycin 750 mg (06/08/20 1007)     LOS: 1 day    Time spent: 35 minutes    Dana Allan, MD  [facility] Pager #: [phone] 7PM-7AM contact night coverage as above

## 2020-06-08 NOTE — Progress Notes (Signed)
Bedside shift report completed. Received patient awake,alert/orientedx4 and able to verbalize needs. NAD noted; respirations easy/even on room air. Movement/sensation noted to all extremities. Dressing to top of R foot c/d/i. L 5th toe necrosis noted. Whiteboard updated. All safety measures in place and personal belongings within reach.

## 2020-06-08 NOTE — Progress Notes (Signed)
Wound care to R foot complete per orders; dressing c/d/i.

## 2020-06-08 NOTE — Consult Note (Signed)
PODIATRY CONSULTATION  NAME Baldwin. MRN [id number] DOB 08-04-1960 DOA 06/06/2020   Reason for consult: Ischemic gangrene LT foot Chief Complaint  Patient presents with  . Toe Pain    Consulting physician: Irine Seal MD  History of present illness: 60 y.o. male presenting for worsening foot pain left lower extremity.  Patient states that his fifth toe is extremely painful.  He has a known PMHx of severe peripheral vascular disease LLE with wounds.  He has been treated in the past at the [facility] wound care center.  Patient presents today resting in bed with left foot pain  Past Medical History:  Diagnosis Date  . Diabetes mellitus without complication ([place])   . Hyperlipidemia   . Hypertension   . Peripheral vascular disease ([place])   . Tobacco use     CBC Latest Ref Rng & Units 06/08/2020 06/07/2020 06/06/2020  WBC 4.0 - 10.5 K/uL 5.4 6.4 5.4  Hemoglobin 13.0 - 17.0 g/dL 11.9(L) 13.0 13.0  Hematocrit 39.0 - 52.0 % 37.3(L) 42.2 41.3  Platelets 150 - 400 K/uL 266 291 320    BMP Latest Ref Rng & Units 06/07/2020 06/06/2020 05/10/2020  Glucose 70 - 99 mg/dL 120(H) 126(H) 117(H)  BUN 6 - 20 mg/dL 29(H) 28(H) 16  Creatinine 0.61 - 1.24 mg/dL 0.95 1.07 1.22  BUN/Creat Ratio 10 - 24 - - -  Sodium 135 - 145 mmol/L 136 138 131(L)  Potassium 3.5 - 5.1 mmol/L 4.4 4.0 3.5  Chloride 98 - 111 mmol/L 101 103 88(L)  CO2 22 - 32 mmol/L '28 28 30  '$ Calcium 8.9 - 10.3 mg/dL 9.7 9.8 9.9      Physical Exam: General: The patient is alert and oriented x3 in no acute distress.   Dermatology: No open wounds noted to the left lower extremity.  There is some visual evidence of onset of ischemia to the left fifth digit  Vascular: ABI 06/05/2020 findings: Resting ABI: 0.41 Resting TBI: 0.33  Segmental Pressures: Decreased segmental pressures seen throughout the left lower extremity. Great toe pressure: 46 mm Hg.Arterial Waveforms: Monophasic waveform seen throughout the left lower  extremity.  Impression: 1. 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 2. Findings suspicious for significant right lower extremity occlusive disease given moderately decreased TBI.  Neurological: Epicritic and protective threshold diminished bilaterally.   Musculoskeletal Exam: No structural deformity noted.  Exquisite pain specifically to the left fifth toe.   ASSESSMENT/PLAN OF CARE Severe critical limb ischemia LLE - The last time the patient had been seen by cardiology on 02/27/2020, it states that he only had 1 patent tibial vessel on the left, and anterior tibial which does not reach past the ankle. - Recommend vascular surgery consult.  Patient may be candidate for BKA given the poor circulation past the level of the ankle, any specific foot surgery for partial amputation will likely not heal.  - There does not appear to be any infection component.  Strictly ischemia. - Will defer management and treatment over to vascular surgery, since this is strictly vascular -Podiatry to sign off.     Thank you for the consult.  Please contact me directly with any questions or concerns.  Cell [phone]   Edrick Kins, DPM [facility]  Dr. Edrick Kins, DPM    [address]  [place], [place] [address]                Office [phone]  Fax [phone]

## 2020-06-08 NOTE — Consult Note (Signed)
  Progress Note    06/08/2020 9:53 AM   Subjective:  Denies pain  Vitals:   06/08/20 0553 06/08/20 0743  BP: 114/73 111/73  Pulse: 87 86  Resp: 18 18  Temp: (!) 97.5 F (36.4 C) 98 F (36.7 C)  SpO2: 100% 100%    Physical Exam: General:  nad Pulmonary: normal non-labored breathing Cardiac: Bilateral common femoral pulses are palpable 1+ palpable popliteal on the right no palpable on the left Cannot palpate pedal pulses bilaterally Extremities: Dry ulceration appears to be granulating on the dorsum of the right foot Left fifth toe dry gangrene    CBC    Component Value Date/Time   WBC 5.4 06/08/2020 0242   RBC 4.13 (L) 06/08/2020 0242   HGB 11.9 (L) 06/08/2020 0242   HGB 11.7 (L) 12/12/2019 1006   HCT 37.3 (L) 06/08/2020 0242   HCT 36.2 (L) 12/12/2019 1006   PLT 266 06/08/2020 0242   PLT 249 12/12/2019 1006   MCV 90.3 06/08/2020 0242   MCV 90 12/12/2019 1006   MCH 28.8 06/08/2020 0242   MCHC 31.9 06/08/2020 0242   RDW 16.6 (H) 06/08/2020 0242   RDW 14.5 12/12/2019 1006   LYMPHSABS 1.0 06/08/2020 0242   LYMPHSABS 1.5 07/31/2019 1401   MONOABS 0.8 06/08/2020 0242   EOSABS 0.2 06/08/2020 0242   EOSABS 0.2 07/31/2019 1401   BASOSABS 0.0 06/08/2020 0242   BASOSABS 0.0 07/31/2019 1401    BMET    Component Value Date/Time   NA 136 06/07/2020 0530   NA 136 12/12/2019 1006   K 4.4 06/07/2020 0530   CL 101 06/07/2020 0530   CO2 28 06/07/2020 0530   GLUCOSE 120 (H) 06/07/2020 0530   BUN 29 (H) 06/07/2020 0530   BUN 18 12/12/2019 1006   CREATININE 0.95 06/07/2020 0530   CALCIUM 9.7 06/07/2020 0530   GFRNONAA >60 06/07/2020 0530   GFRAA 82 12/12/2019 1006    INR    Component Value Date/Time   INR 1.0 06/06/2020 0802     Intake/Output Summary (Last 24 hours) at 06/08/2020 0953 Last data filed at 06/07/2020 1400 Gross per 24 hour  Intake 1080 ml  Output 300 ml  Net 780 ml     Assessment:  60 y.o. male is here with left 5th toe gangrenous  changes. Podiatry following.   Plan: Tentative angio on Tuesday, if we can get scheduled tomorrow will move up but he does not need to be npo   Victorino Fatzinger C. Donzetta Matters, MD [facility] Office: [phone] Pager: [phone]  06/08/2020 9:53 AM

## 2020-06-08 NOTE — Plan of Care (Signed)
  Problem: Education: Goal: Verbalization of understanding the information provided will improve Outcome: Progressing   Problem: Coping: Goal: Level of anxiety will decrease Outcome: Progressing   Problem: Physical Regulation: Goal: Postoperative complications will be avoided or minimized Outcome: Progressing   Problem: Respiratory: Goal: Ability to maintain a clear airway will improve Outcome: Progressing   Problem: Pain Management: Goal: Pain level will decrease Outcome: Progressing   Problem: Skin Integrity: Goal: Signs of wound healing will improve Outcome: Progressing   Problem: Tissue Perfusion: Goal: Ability to maintain adequate tissue perfusion will improve Outcome: Progressing   Problem: Education: Goal: Required Educational Video(s) Outcome: Progressing   Problem: Clinical Measurements: Goal: Ability to maintain clinical measurements within normal limits will improve Outcome: Progressing Goal: Postoperative complications will be avoided or minimized Outcome: Progressing   Problem: Skin Integrity: Goal: Demonstration of wound healing without infection will improve Outcome: Progressing   Problem: Education: Goal: Knowledge of General Education information will improve Description: Including pain rating scale, medication(s)/side effects and non-pharmacologic comfort measures Outcome: Progressing   Problem: Health Behavior/Discharge Planning: Goal: Ability to manage health-related needs will improve Outcome: Progressing   Problem: Clinical Measurements: Goal: Ability to maintain clinical measurements within normal limits will improve Outcome: Progressing Goal: Will remain free from infection Outcome: Progressing Goal: Diagnostic test results will improve Outcome: Progressing Goal: Respiratory complications will improve Outcome: Progressing Goal: Cardiovascular complication will be avoided Outcome: Progressing   Problem: Activity: Goal: Risk for  activity intolerance will decrease Outcome: Progressing   Problem: Nutrition: Goal: Adequate nutrition will be maintained Outcome: Progressing   Problem: Coping: Goal: Level of anxiety will decrease Outcome: Progressing   Problem: Elimination: Goal: Will not experience complications related to bowel motility Outcome: Progressing Goal: Will not experience complications related to urinary retention Outcome: Progressing   Problem: Pain Managment: Goal: General experience of comfort will improve Outcome: Progressing   Problem: Safety: Goal: Ability to remain free from injury will improve Outcome: Progressing   Problem: Skin Integrity: Goal: Risk for impaired skin integrity will decrease Outcome: Progressing

## 2020-06-09 DIAGNOSIS — E43 Unspecified severe protein-calorie malnutrition: Secondary | ICD-10-CM | POA: Diagnosis not present

## 2020-06-09 DIAGNOSIS — I70245 Atherosclerosis of native arteries of left leg with ulceration of other part of foot: Secondary | ICD-10-CM | POA: Diagnosis not present

## 2020-06-09 DIAGNOSIS — M869 Osteomyelitis, unspecified: Secondary | ICD-10-CM | POA: Diagnosis not present

## 2020-06-09 DIAGNOSIS — I10 Essential (primary) hypertension: Secondary | ICD-10-CM | POA: Diagnosis not present

## 2020-06-09 DIAGNOSIS — I739 Peripheral vascular disease, unspecified: Secondary | ICD-10-CM | POA: Diagnosis not present

## 2020-06-09 LAB — BASIC METABOLIC PANEL
Anion gap: 4 — ABNORMAL LOW (ref 5–15)
BUN: 30 mg/dL — ABNORMAL HIGH (ref 6–20)
CO2: 23 mmol/L (ref 22–32)
Calcium: 9 mg/dL (ref 8.9–10.3)
Chloride: 108 mmol/L (ref 98–111)
Creatinine, Ser: 1.12 mg/dL (ref 0.61–1.24)
GFR, Estimated: >60 mL/min (ref >60)
Glucose, Bld: 106 mg/dL — ABNORMAL HIGH (ref 70–99)
Potassium: 3.8 mmol/L (ref 3.5–5.1)
Sodium: 135 mmol/L (ref 135–145)

## 2020-06-09 NOTE — Progress Notes (Signed)
PROGRESS NOTE    [name].  [id number] DOB: 1960-10-31 DOA: 06/06/2020 PCP: Cipriano Mile, NP   Brief Narrative: Ruben Beams. is a 60 y.o. male with history of CAD, hypertension, PAD.  Patient presented secondary to pain of his left pinky toe and was found to have evidence of acute osteomyelitis via MRI.  Patient started on empiric vancomycin, cefepime, Flagyl.  Podiatry was consulted initially with recommendations for vascular surgery consult.  Vascular surgery planning aortogram with left lower extremity runoff.  Blood cultures no growth to date.   Assessment & Plan:   Principal Problem:   Osteomyelitis of fifth toe of left foot (HCC) Active Problems:   Essential hypertension, benign   Type 2 diabetes mellitus without complication, without long-term current use of insulin (HCC)   PVD (peripheral vascular disease) (HCC)   Toe infection   Protein-calorie malnutrition, severe   Tobacco use   Osteomyelitis of fifth toe of left foot Confirmed on MRI. Patient started empirically on vancomycin/cefepime/metronidazole IV. Blood cultures obtained and no growth to date. Patient initially evaluated by podiatry who recommended vascular surgery consult. Vascular surgery plan tentative angio today vs tomorrow as able. -Vascular surgery recommendations: Aortogram with LLE runoff/?intervention tomorrow -Continue vancomycin/cefepime/flagyl  Chronic wound Located on right dorsum of right foot. Wound care consulted with recommendations: 1. Add enzymatic debridement ointment and saline moist gauze. Top with dry dressing. Change daily.   Hyperlipidemia -Continue Lipitor  Primary hypertension -Continue lisinopril  Severe PAD -Continue aspirin/Plavix  Tobacco use Counseled on cessation.  Chronic pain/neuropathy -Continue gabapentin  Severe malnutrition Dietitian consulted with recommendations: 1. Add Ensure Enlive po TID, each supplement provides 350 kcal and 20  grams of protein. 2. Add Magic cup TID with meals, each supplement provides 290 kcal and 9 grams of protein. 3. Add 30 ml ProSource Plus po BID, each supplement provides 100 kcal and 15 grams of protein.  4. Add 1 packet Juven BID, each packet provides 95 calories, 2.5 grams of protein (collagen), and 9.8 grams of carbohydrate (3 grams sugar); also contains 7 grams of L-arginine and L-glutamine, 300 mg vitamin C, 15 mg vitamin E, 1.2 mcg vitamin B-12, 9.5 mg zinc, 200 mg calcium, and 1.5 g  Calcium Beta-hydroxy-Beta-methylbutyrate to support wound healing. 5. Add MVI with minerals daily.   DVT prophylaxis: Lovenox Code Status:   Code Status: Full Code Family Communication: None at bedside Disposition Plan: Discharge pending vascular surgery/podiatry recommendations and outpatient antibiotic regimen if needed   Consultants:   Podiatry  Vascular surgery  Procedures:   None  Antimicrobials:  Vancomycin IV  Cefepime IV  Metronidazole IV    Subjective: Some pain in his foot. No other concerns.  Objective: Vitals:   06/08/20 0553 06/08/20 0743 06/08/20 1743 06/08/20 1921  BP: 114/73 111/73 112/71 (!) 100/55  Pulse: 87 86 84 90  Resp: '18 18 18 15  '$ Temp: (!) 97.5 F (36.4 C) 98 F (36.7 C) 99.7 F (37.6 C) 98.5 F (36.9 C)  TempSrc: Oral  Oral Oral  SpO2: 100% 100% 100% 97%  Weight:        Intake/Output Summary (Last 24 hours) at 06/09/2020 0544 Last data filed at 06/08/2020 1836 Gross per 24 hour  Intake 1785 ml  Output --  Net 1785 ml   Filed Weights   06/06/20 1311  Weight: 43.7 kg    Examination:  General exam: Appears calm and comfortable  Respiratory system: Clear to auscultation. Respiratory effort normal. Cardiovascular system: S1 &  S2 heard, RRR. No murmurs, rubs, gallops or clicks. Gastrointestinal system: Abdomen is nondistended, soft and nontender. No organomegaly or masses felt. Normal bowel sounds heard. Central nervous system: Alert and  oriented. No focal neurological deficits. Musculoskeletal: No edema. No calf tenderness. Left fifth LE digit slightly swollen. Skin: No cyanosis. No rashes Psychiatry: Judgement and insight appear normal. Mood & affect appropriate.     Data Reviewed: I have personally reviewed following labs and imaging studies  CBC Lab Results  Component Value Date   WBC 5.4 06/08/2020   RBC 4.13 (L) 06/08/2020   HGB 11.9 (L) 06/08/2020   HCT 37.3 (L) 06/08/2020   MCV 90.3 06/08/2020   MCH 28.8 06/08/2020   PLT 266 06/08/2020   MCHC 31.9 06/08/2020   RDW 16.6 (H) 06/08/2020   LYMPHSABS 1.0 06/08/2020   MONOABS 0.8 06/08/2020   EOSABS 0.2 06/08/2020   BASOSABS 0.0 [id number]     Last metabolic panel Lab Results  Component Value Date   NA 135 06/09/2020   K 3.8 06/09/2020   CL 108 06/09/2020   CO2 23 06/09/2020   BUN 30 (H) 06/09/2020   CREATININE 1.12 06/09/2020   GLUCOSE 106 (H) 06/09/2020   GFRNONAA >60 06/09/2020   GFRAA 82 12/12/2019   CALCIUM 9.0 06/09/2020   PROT 7.7 06/07/2020   ALBUMIN 4.1 06/07/2020   LABGLOB 2.6 10/13/2018   AGRATIO 1.7 10/13/2018   BILITOT 0.6 06/07/2020   ALKPHOS 80 06/07/2020   AST 15 06/07/2020   ALT 14 06/07/2020   ANIONGAP 4 (L) 06/09/2020    CBG (last 3)  No results for input(s): GLUCAP in the last 72 hours.   GFR: Estimated Creatinine Clearance: 43.4 mL/min (by C-G formula based on SCr of 1.12 mg/dL).  Coagulation Profile: Recent Labs  Lab 06/06/20 0802  INR 1.0    Recent Results (from the past 240 hour(s))  Culture, blood (routine x 2)     Status: None (Preliminary result)   Collection Time: 06/06/20  8:02 AM   Specimen: BLOOD  Result Value Ref Range Status   Specimen Description   Final    BLOOD LEFT ANTECUBITAL Performed at [facility] [address], [place], [place] [address]    Special Requests   Final    BOTTLES DRAWN AEROBIC AND ANAEROBIC Blood Culture adequate volume Performed at [facility] [address], [place], [place] [address]    Culture   Final    NO GROWTH 2 DAYS Performed at [facility] [address] [place], [place] [address]    Report Status PENDING  Incomplete  Resp Panel by RT-PCR (Flu A&B, Covid) Nasopharyngeal Swab     Status: None   Collection Time: 06/06/20  8:02 AM   Specimen: Nasopharyngeal Swab; Nasopharyngeal(NP) swabs in vial transport medium  Result Value Ref Range Status   SARS Coronavirus 2 by RT PCR NEGATIVE NEGATIVE Final    Comment: (NOTE) SARS-CoV-2 target nucleic acids are NOT DETECTED.  The SARS-CoV-2 RNA is generally detectable in upper respiratory specimens during the acute phase of infection. The lowest concentration of SARS-CoV-2 viral copies this assay can detect is 138 copies/mL. A negative result does not preclude SARS-Cov-2 infection and should not be used as the sole basis for treatment or other patient management decisions. A negative result may occur with  improper specimen collection/handling, submission of specimen other than nasopharyngeal swab, presence of viral mutation(s) within the areas targeted by this assay, and inadequate number  of viral copies(<138 copies/mL). A negative result must be combined with clinical observations, patient history, and epidemiological information. The expected result is Negative.  Fact Sheet for Patients:  EntrepreneurPulse.com.au  Fact Sheet for Healthcare Providers:  IncredibleEmployment.be  This test is no t yet approved or cleared by the [place] FDA and  has been authorized for detection and/or diagnosis of SARS-CoV-2 by FDA under an Emergency Use Authorization (EUA). This EUA will remain  in effect (meaning this test can be used) for the duration of the COVID-19 declaration under Section 564(b)(1) of the Act, 21 U.S.C.section 360bbb-3(b)(1), unless the authorization is terminated  or revoked sooner.        Influenza A by PCR NEGATIVE NEGATIVE Final   Influenza B by PCR NEGATIVE NEGATIVE Final    Comment: (NOTE) The Xpert Xpress SARS-CoV-2/FLU/RSV plus assay is intended as an aid in the diagnosis of influenza from Nasopharyngeal swab specimens and should not be used as a sole basis for treatment. Nasal washings and aspirates are unacceptable for Xpert Xpress SARS-CoV-2/FLU/RSV testing.  Fact Sheet for Patients: EntrepreneurPulse.com.au  Fact Sheet for Healthcare Providers: IncredibleEmployment.be  This test is not yet approved or cleared by the [place] FDA and has been authorized for detection and/or diagnosis of SARS-CoV-2 by FDA under an Emergency Use Authorization (EUA). This EUA will remain in effect (meaning this test can be used) for the duration of the COVID-19 declaration under Section 564(b)(1) of the Act, 21 U.S.C. section 360bbb-3(b)(1), unless the authorization is terminated or revoked.  Performed at [facility], [place] [address], [place], [place] [address]   Culture, blood (routine x 2)     Status: None (Preliminary result)   Collection Time: 06/06/20  8:17 AM   Specimen: BLOOD  Result Value Ref Range Status   Specimen Description   Final    BLOOD LEFT ANTECUBITAL Performed at [facility] [address], [place], [place] [address]    Special Requests   Final    BOTTLES DRAWN AEROBIC AND ANAEROBIC Blood Culture adequate volume Performed at [facility] [address] [place], [place] [address]    Culture   Final    NO GROWTH 2 DAYS Performed at [facility] [address], [place], [place] [address]    Report Status PENDING  Incomplete        Radiology Studies: VAS [place] LOWER EXTREMITY SAPHENOUS VEIN MAPPING  Result Date: 06/08/2020 LOWER EXTREMITY VEIN MAPPING Patient Name:  Ruben Bommer.  Date of Exam:   06/08/2020 Medical Rec #: [id number]               Accession #:    [id number] Date of Birth: [birth year]              Patient Gender: M Patient Age:   59Y Exam Location:  [facility] Procedure:      VAS [place] LOWER EXTREMITY SAPHENOUS VEIN MAPPING Referring Phys: [id number] [place] --------------------------------------------------------------------------------  Indications:  Pre-op History:      History of PAD; patient is pre-operative for lower extremity               bypass graft. Risk Factors: Hypertension, hyperlipidemia, current smoker, coronary artery               disease, PAD.  Limitations: Constant movement secondary to ischemic pain, body habitus Comparison Study: No prior study Performing Technologist: Sharion Dove RVS  Examination Guidelines: A complete evaluation  includes B-mode imaging, spectral Doppler, color Doppler, and power Doppler as needed of all accessible portions of each vessel. Bilateral testing is considered an integral part of a complete examination. Limited examinations for reoccurring indications may be performed as noted. +---------------+-----------+----------------------+---------------+-----------+   RT Diameter  RT Findings         GSV            LT Diameter  LT Findings      (cm)                                            (cm)                  +---------------+-----------+----------------------+---------------+-----------+      0.56                     Saphenofemoral         0.30                                                   Junction                                  +---------------+-----------+----------------------+---------------+-----------+      0.34                     Proximal thigh         0.18                  +---------------+-----------+----------------------+---------------+-----------+      0.27       branching       Mid thigh            0.23                  +---------------+-----------+----------------------+---------------+-----------+      0.28                       Distal thigh          2.20       branching  +---------------+-----------+----------------------+---------------+-----------+      0.22                          Knee              0.32                  +---------------+-----------+----------------------+---------------+-----------+      0.25                       Prox calf            0.29                  +---------------+-----------+----------------------+---------------+-----------+      0.10       branching        Mid calf            0.27                  +---------------+-----------+----------------------+---------------+-----------+      0.25  Distal calf           0.27                  +---------------+-----------+----------------------+---------------+-----------+      0.18       branching         Ankle              0.23                  +---------------+-----------+----------------------+---------------+-----------+    Preliminary         Scheduled Meds: . (feeding supplement) PROSource Plus  30 mL Oral BID BM  . aspirin EC  81 mg Oral Q1400  . atorvastatin  40 mg Oral QHS  . clopidogrel  75 mg Oral Q breakfast  . collagenase   Topical Daily  . docusate sodium  100 mg Oral BID  . enoxaparin (LOVENOX) injection  30 mg Subcutaneous Daily  . feeding supplement  237 mL Oral TID BM  . gabapentin  600 mg Oral TID  . lisinopril  20 mg Oral Daily  . multivitamin with minerals  1 tablet Oral Daily  . nicotine  7 mg Transdermal Daily  . nutrition supplement (JUVEN)  1 packet Oral BID BM   Continuous Infusions: . sodium chloride 100 mL/hr at 06/07/20 1810  . sodium chloride 45 mL/hr at 06/08/20 2016  . ceFEPime (MAXIPIME) IV Stopped (06/08/20 1811)  . metronidazole 500 mg (06/09/20 0356)  . vancomycin Stopped (06/08/20 1107)     LOS: 2 days     Cordelia Poche, MD [facility] 06/09/2020, 5:44 AM  If 7PM-7AM, please contact night-coverage [email or web address]

## 2020-06-09 NOTE — H&P (View-Only) (Signed)
  Progress Note    06/09/2020 7:45 AM * No surgery found *  Subjective:  No new complaints   Vitals:   06/08/20 1921 06/09/20 0607  BP: (!) 100/55 131/72  Pulse: 90 96  Resp: 15 16  Temp: 98.5 F (36.9 C) 98.2 F (36.8 C)  SpO2: 97% 100%   Physical Exam: Lungs:  Non labored Extremities:  L fifth toe gangrene Neurologic: A&O  CBC    Component Value Date/Time   WBC 5.4 06/08/2020 0242   RBC 4.13 (L) 06/08/2020 0242   HGB 11.9 (L) 06/08/2020 0242   HGB 11.7 (L) 12/12/2019 1006   HCT 37.3 (L) 06/08/2020 0242   HCT 36.2 (L) 12/12/2019 1006   PLT 266 06/08/2020 0242   PLT 249 12/12/2019 1006   MCV 90.3 06/08/2020 0242   MCV 90 12/12/2019 1006   MCH 28.8 06/08/2020 0242   MCHC 31.9 06/08/2020 0242   RDW 16.6 (H) 06/08/2020 0242   RDW 14.5 12/12/2019 1006   LYMPHSABS 1.0 06/08/2020 0242   LYMPHSABS 1.5 07/31/2019 1401   MONOABS 0.8 06/08/2020 0242   EOSABS 0.2 06/08/2020 0242   EOSABS 0.2 07/31/2019 1401   BASOSABS 0.0 06/08/2020 0242   BASOSABS 0.0 07/31/2019 1401    BMET    Component Value Date/Time   NA 135 06/09/2020 0100   NA 136 12/12/2019 1006   K 3.8 06/09/2020 0100   CL 108 06/09/2020 0100   CO2 23 06/09/2020 0100   GLUCOSE 106 (H) 06/09/2020 0100   BUN 30 (H) 06/09/2020 0100   BUN 18 12/12/2019 1006   CREATININE 1.12 06/09/2020 0100   CALCIUM 9.0 06/09/2020 0100   GFRNONAA >60 06/09/2020 0100   GFRAA 82 12/12/2019 1006    INR    Component Value Date/Time   INR 1.0 06/06/2020 0802     Intake/Output Summary (Last 24 hours) at 06/09/2020 0745 Last data filed at 06/08/2020 1836 Gross per 24 hour  Intake 1785 ml  Output --  Net 1785 ml     Assessment/Plan:  60 y.o. male with L 5th toe gangrene * No surgery found *   Plan is for Aortogram with LLE runoff and possible intervention tomorrow Pending results of angio, Podiatry will manage L fifth toe NPO past midnight Consent; all questions answered   Dagoberto Ligas, PA-C [facility] [phone] 06/09/2020 7:45 AM  I have independently interviewed and examined patient and agree with PA assessment and plan above.   [place] C. Donzetta Matters, MD [facility] [place] Office: [phone] Pager: [phone]

## 2020-06-09 NOTE — Progress Notes (Addendum)
  Progress Note    06/09/2020 7:45 AM * No surgery found *  Subjective:  No new complaints   Vitals:   06/08/20 1921 06/09/20 0607  BP: (!) 100/55 131/72  Pulse: 90 96  Resp: 15 16  Temp: 98.5 F (36.9 C) 98.2 F (36.8 C)  SpO2: 97% 100%   Physical Exam: Lungs:  Non labored Extremities:  L fifth toe gangrene Neurologic: A&O  CBC    Component Value Date/Time   WBC 5.4 06/08/2020 0242   RBC 4.13 (L) 06/08/2020 0242   HGB 11.9 (L) 06/08/2020 0242   HGB 11.7 (L) 12/12/2019 1006   HCT 37.3 (L) 06/08/2020 0242   HCT 36.2 (L) 12/12/2019 1006   PLT 266 06/08/2020 0242   PLT 249 12/12/2019 1006   MCV 90.3 06/08/2020 0242   MCV 90 12/12/2019 1006   MCH 28.8 06/08/2020 0242   MCHC 31.9 06/08/2020 0242   RDW 16.6 (H) 06/08/2020 0242   RDW 14.5 12/12/2019 1006   LYMPHSABS 1.0 06/08/2020 0242   LYMPHSABS 1.5 07/31/2019 1401   MONOABS 0.8 06/08/2020 0242   EOSABS 0.2 06/08/2020 0242   EOSABS 0.2 07/31/2019 1401   BASOSABS 0.0 06/08/2020 0242   BASOSABS 0.0 07/31/2019 1401    BMET    Component Value Date/Time   NA 135 06/09/2020 0100   NA 136 12/12/2019 1006   K 3.8 06/09/2020 0100   CL 108 06/09/2020 0100   CO2 23 06/09/2020 0100   GLUCOSE 106 (H) 06/09/2020 0100   BUN 30 (H) 06/09/2020 0100   BUN 18 12/12/2019 1006   CREATININE 1.12 06/09/2020 0100   CALCIUM 9.0 06/09/2020 0100   GFRNONAA >60 06/09/2020 0100   GFRAA 82 12/12/2019 1006    INR    Component Value Date/Time   INR 1.0 06/06/2020 0802     Intake/Output Summary (Last 24 hours) at 06/09/2020 0745 Last data filed at 06/08/2020 1836 Gross per 24 hour  Intake 1785 ml  Output --  Net 1785 ml     Assessment/Plan:  60 y.o. male with L 5th toe gangrene * No surgery found *   Plan is for Aortogram with LLE runoff and possible intervention tomorrow Pending results of angio, Podiatry will manage L fifth toe NPO past midnight Consent; all questions answered   Dagoberto Ligas, PA-C [facility] [phone] 06/09/2020 7:45 AM  I have independently interviewed and examined patient and agree with PA assessment and plan above.   [place] C. Donzetta Matters, MD [facility] [place] Office: [phone] Pager: [phone]

## 2020-06-09 NOTE — Plan of Care (Signed)
  Problem: Coping: Goal: Level of anxiety will decrease Outcome: Progressing   Problem: Physical Regulation: Goal: Postoperative complications will be avoided or minimized Outcome: Progressing   Problem: Respiratory: Goal: Ability to maintain a clear airway will improve Outcome: Progressing   Problem: Pain Management: Goal: Pain level will decrease Outcome: Progressing   Problem: Skin Integrity: Goal: Signs of wound healing will improve Outcome: Progressing   Problem: Tissue Perfusion: Goal: Ability to maintain adequate tissue perfusion will improve Outcome: Progressing

## 2020-06-10 ENCOUNTER — Ambulatory Visit (HOSPITAL_COMMUNITY): Admission: RE | Admit: 2020-06-10 | Payer: Medicare Other | Source: Home / Self Care | Admitting: Surgery

## 2020-06-10 ENCOUNTER — Inpatient Hospital Stay (HOSPITAL_COMMUNITY)
Admission: EM | Disposition: A | Discharge status: Home Health | Payer: Self-pay | Source: Home / Self Care | Attending: Internal Medicine

## 2020-06-10 DIAGNOSIS — E43 Unspecified severe protein-calorie malnutrition: Secondary | ICD-10-CM | POA: Diagnosis not present

## 2020-06-10 DIAGNOSIS — I10 Essential (primary) hypertension: Secondary | ICD-10-CM | POA: Diagnosis not present

## 2020-06-10 DIAGNOSIS — M869 Osteomyelitis, unspecified: Secondary | ICD-10-CM | POA: Diagnosis not present

## 2020-06-10 DIAGNOSIS — I739 Peripheral vascular disease, unspecified: Secondary | ICD-10-CM | POA: Diagnosis not present

## 2020-06-10 DIAGNOSIS — I70245 Atherosclerosis of native arteries of left leg with ulceration of other part of foot: Secondary | ICD-10-CM | POA: Diagnosis not present

## 2020-06-10 HISTORY — PX: ABDOMINAL AORTOGRAM W/LOWER EXTREMITY: CATH118223

## 2020-06-10 HISTORY — PX: PERIPHERAL VASCULAR INTERVENTION: CATH118257

## 2020-06-10 LAB — POCT ACTIVATED CLOTTING TIME: Activated Clotting Time: 321 seconds

## 2020-06-10 SURGERY — ABDOMINAL AORTOGRAM W/LOWER EXTREMITY
Anesthesia: LOCAL | Laterality: Left

## 2020-06-10 MED ORDER — SODIUM CHLORIDE 0.9% FLUSH: 3.0000 mL | INTRAVENOUS | Status: DC | PRN

## 2020-06-10 MED ORDER — HEPARIN (PORCINE) IN NACL 1000-0.9 UT/500ML-% IV SOLN
INTRAVENOUS | Status: DC | PRN
  Administered 2020-06-10 (×2): 500 mL

## 2020-06-10 MED ORDER — IODIXANOL 320 MG/ML IV SOLN
INTRAVENOUS | Status: DC | PRN
  Administered 2020-06-10: 130 mL

## 2020-06-10 MED ORDER — SODIUM CHLORIDE 0.9 % IV SOLN: INTRAVENOUS | Status: DC

## 2020-06-10 MED ORDER — SODIUM CHLORIDE 0.9% FLUSH
3.0000 mL | Freq: Two times a day (BID) | INTRAVENOUS | Status: DC
  Administered 2020-06-12 – 2020-06-16 (×5): 3 mL via INTRAVENOUS

## 2020-06-10 MED ORDER — ACETAMINOPHEN 325 MG PO TABS: 650.0000 mg | ORAL_TABLET | ORAL | Status: DC | PRN

## 2020-06-10 MED ORDER — MIDAZOLAM HCL 2 MG/2ML IJ SOLN
INTRAMUSCULAR | Status: DC | PRN
  Administered 2020-06-10 (×2): 2 mg via INTRAVENOUS
  Administered 2020-06-10 (×2): 1 mg via INTRAVENOUS

## 2020-06-10 MED ORDER — ONDANSETRON HCL 4 MG/2ML IJ SOLN: 4.0000 mg | Freq: Four times a day (QID) | INTRAMUSCULAR | Status: DC | PRN

## 2020-06-10 MED ORDER — HYDRALAZINE HCL 20 MG/ML IJ SOLN: 5.0000 mg | INTRAMUSCULAR | Status: DC | PRN

## 2020-06-10 MED ORDER — SODIUM CHLORIDE 0.9 % IV SOLN: 250.0000 mL | INTRAVENOUS | Status: DC | PRN

## 2020-06-10 MED ORDER — FENTANYL CITRATE (PF) 100 MCG/2ML IJ SOLN
INTRAMUSCULAR | Status: DC | PRN
  Administered 2020-06-10 (×4): 50 ug via INTRAVENOUS

## 2020-06-10 MED ORDER — LIDOCAINE HCL (PF) 1 % IJ SOLN
INTRAMUSCULAR | Status: DC | PRN
  Administered 2020-06-10: 12 mL
  Administered 2020-06-10: 2 mL

## 2020-06-10 MED ORDER — LABETALOL HCL 5 MG/ML IV SOLN: 10.0000 mg | INTRAVENOUS | Status: DC | PRN

## 2020-06-10 MED ORDER — LABETALOL HCL 5 MG/ML IV SOLN
INTRAVENOUS | Status: DC | PRN
  Administered 2020-06-10 (×2): 10 mg via INTRAVENOUS

## 2020-06-10 MED ORDER — HEPARIN SODIUM (PORCINE) 1000 UNIT/ML IJ SOLN
INTRAMUSCULAR | Status: DC | PRN
  Administered 2020-06-10: 4500 [IU] via INTRAVENOUS

## 2020-06-10 MED ORDER — SODIUM CHLORIDE 0.9 % WEIGHT BASED INFUSION: 1.0000 mL/kg/h | INTRAVENOUS | Status: AC

## 2020-06-10 SURGICAL SUPPLY — 31 items
BALLN STERLING OTW 5X100X135 (BALLOONS) ×2
BALLN STERLING SL 4X100X150 (BALLOONS) ×2
BALLOON STERLING OTW 5X100X135 (BALLOONS) IMPLANT
BALLOON STERLING SL 4X100X150 (BALLOONS) IMPLANT
CATH CXI 2.6F 65 ST (CATHETERS) ×2
CATH OMNI FLUSH 5F 65CM (CATHETERS) ×1 IMPLANT
CATH QUICKCROSS SUPP .035X90CM (MICROCATHETER) ×1 IMPLANT
CATH SPRT STRG 65X2.6FR ACPT (CATHETERS) IMPLANT
DEVICE ONE SNARE 10MM (MISCELLANEOUS) ×1 IMPLANT
DEVICE TORQUE H2O (MISCELLANEOUS) ×2 IMPLANT
DEVICE VASC CLSR CELT ART 6 (Vascular Products) ×1 IMPLANT
GUIDEWIRE ANGLED .035X150CM (WIRE) ×1 IMPLANT
KIT ENCORE 26 ADVANTAGE (KITS) ×1 IMPLANT
KIT MICROPUNCTURE NIT STIFF (SHEATH) ×1 IMPLANT
KIT PV (KITS) ×2 IMPLANT
SHEATH MICROPUNCTURE PEDAL 4FR (SHEATH) ×1 IMPLANT
SHEATH PINNACLE 5F 10CM (SHEATH) ×1 IMPLANT
SHEATH PINNACLE 6F 10CM (SHEATH) ×1 IMPLANT
SHEATH PINNACLE MP 6F 45CM (SHEATH) ×1 IMPLANT
SHEATH PROBE COVER 6X72 (BAG) ×1 IMPLANT
STENT ELUVIA 6X120X130 (Permanent Stent) ×2 IMPLANT
STENT ELUVIA 6X80X130 (Permanent Stent) ×1 IMPLANT
STENT EXPRESS LD 8X27X75 (Permanent Stent) ×1 IMPLANT
SYR MEDRAD MARK 7 150ML (SYRINGE) ×2 IMPLANT
TAPE VIPERTRACK RADIOPAQ (MISCELLANEOUS) IMPLANT
TAPE VIPERTRACK RADIOPAQUE (MISCELLANEOUS) ×2
TRANSDUCER W/STOPCOCK (MISCELLANEOUS) ×2 IMPLANT
TRAY PV CATH (CUSTOM PROCEDURE TRAY) ×2 IMPLANT
TUBING CIL FLEX 10 FLL-RA (TUBING) ×1 IMPLANT
WIRE BENTSON .035X145CM (WIRE) ×1 IMPLANT
WIRE G V18X300CM (WIRE) ×1 IMPLANT

## 2020-06-10 NOTE — Progress Notes (Signed)
Patient brought to 4E from cath lab. Telemetry box applied, CCMD notified. Patient stated 0/10 pain scale. Patient oriented to room and staff. Call bell in reach.  Daymon Larsen, RN

## 2020-06-10 NOTE — Interval H&P Note (Signed)
History and Physical Interval Note:  06/10/2020 12:19 PM  [name].  has presented today for surgery, with the diagnosis of pad.  The various methods of treatment have been discussed with the patient and family. After consideration of risks, benefits and other options for treatment, the patient has consented to  Procedure(s): ABDOMINAL AORTOGRAM W/LOWER EXTREMITY (N/A) as a surgical intervention.  The patient's history has been reviewed, patient examined, no change in status, stable for surgery.  I have reviewed the patient's chart and labs.  Questions were answered to the patient's satisfaction.     [name]

## 2020-06-10 NOTE — Plan of Care (Signed)
  Problem: Pain Management: Goal: Pain level will decrease Outcome: Not Progressing   Problem: Skin Integrity: Goal: Signs of wound healing will improve Outcome: Not Progressing

## 2020-06-10 NOTE — Op Note (Signed)
Patient name: [name]. MRN: [id number] DOB: 01/09/1961 Sex: male  06/10/2020 Pre-operative Diagnosis: Left toe ulcer Post-operative diagnosis:  Same Surgeon:  Annamarie Major Procedure Performed:  1.  Ultrasound-guided access, right common femoral artery  2.  Ultrasound-guided access, left anterior tibial artery  3.  Abdominal aortogram  4.  Left lower extremity runoff  5.  Stent, left superficial femoral and popliteal artery  6.  Stent, left common iliac artery  7.  Conscious sedation, 160 minutes  8.  Closure device, Celt   Indications: This is a 60 year old gentleman for whom vascular surgery consultation was requested for intervention of the left leg for ulcer.  Details of the procedure were discussed with the patient and he wished to proceed  Procedure:  The patient was identified in the holding area and taken to room 8.  The patient was then placed supine on the table and prepped and draped in the usual sterile fashion.  A time out was called.  Conscious sedation was administered with the use of IV fentanyl and Versed under continuous physician and nurse monitoring.  Heart rate, blood pressure, and oxygen saturation were continuously monitored.  Total sedation time was 106 minutes.  Ultrasound was used to evaluate the right common femoral artery.  It was patent .  A digital ultrasound image was acquired.  A micropuncture needle was used to access the right common femoral artery under ultrasound guidance.  An 018 wire was advanced without resistance and a micropuncture sheath was placed.  The 018 wire was removed and a benson wire was placed.  The micropuncture sheath was exchanged for a 5 french sheath.  An omniflush catheter was advanced over the wire to the level of L-1.  An abdominal angiogram was obtained.  Next, using the omniflush catheter and a benson wire, the aortic bifurcation was crossed and the catheter was placed into theleft external iliac artery and left runoff  was obtained.    Findings:   Aortogram: No significant renal artery stenosis.  The right iliac system is widely patent as well as its associated stent.  There is a 60 to 70% stenosis within the left distal common iliac artery.  Extrailiac artery appears patent throughout its course.  Right Lower Extremity: Not evaluated  Left Lower Extremity: Left common femoral and profundofemoral artery are patent throughout the course.  Superficial femoral artery is occluded with reconstitution at the adductor canal.  The above-knee popliteal artery is very small in caliber however remains patent as does the below-knee popliteal artery.  Dominant runoff is the anterior tibial artery  Intervention: After the above images were acquired the decision made to proceed with intervention.  A 6 French 45 cm sheath was advanced over the aortic bifurcation and placed in the left common iliac artery.  The patient was fully heparinized.  I then attempted subintimal recanalization of the occluded superficial femoral artery using a 035 Glidewire and a quick cross catheter.  I had difficulty getting out of a dissection plane and ultimately felt it was best to come retrograde.  Ultrasound was used to evaluate the left anterior tibial artery which was calcified.  It was cannulated under ultrasound guidance with a micropuncture needle.  A 018 wire was advanced and a 4 [place] sheath was placed.  Next using a CXI catheter and a V-18 wire, I was able to cross the occlusion in the superficial femoral artery.  From above I was able to snare the V-18 wire and bring it  out through the sheath.  I then used a 4 mm balloon to predilate the occlusion and stented it with overlapping 6 mm Elluvia stents (6 x 120, 6 x 120, 6 x 80) the stents were then postdilated with a 5 mm balloon.  Completion imaging was then performed which showed that the superficial femoral artery was now occluded.  There was narrowing in the popliteal artery however this was his  baseline and likely from spasm and underfilling.  I elected not to continue stents down into the below-knee popliteal artery.  I then elected to stent the left common iliac artery.  This was done using an 8 x 27 Xpress balloon expandable stent.  There is no residual stenosis upon completion.  The long 83 French sheath was then removed and exchanged for a short 6 French sheath in the groin was closed with a Celt.  Was unable to sit still and was continuously moving throughout the case making imaging and deployment accuracy very challenging.  Impression:  #1  Successful recanalization of an occluded left superficial femoral artery via pedal access and subsequent stenting using 6 mm Elluvia   #2  Greater than 70% left common iliac artery treated using an 8 x 27 balloon expandable stent  #3  Patient had extreme difficulty sitting still and was constantly lifting his head up and moving his pelvis and legs around despite being restrained.  This will need to be factored and should he require return evaluation.    Theotis Burrow, M.D., [facility] [place] Office: [phone] Pager:  [phone]

## 2020-06-10 NOTE — Progress Notes (Addendum)
Pharmacy Antibiotic Note  [name]. is a 60 y.o. male admitted on 06/06/2020 with left fifth toe osteomyelitis. Pharmacy has been consulted for vancomycin and cefepime dosing; also on Flagyl. Possible vascular intervention planned for today.  He is afebrile, WBC are normal, and renal function fairly stable.  Plan: Continue vancomycin 750 mg IV q24h Goal AUC 400-550, expected AUC: 439, SCr used: 0.95 Cefepime 2 g IV q12h Flagyl 500 mg IV q8h per MD F/U renal function, culture results, and clinical status Levels as indicated  Weight: 43.7 kg (96 lb 6.4 oz)  Temp (24hrs), Avg:98.5 F (36.9 C), Min:98.1 F (36.7 C), Max:98.6 F (37 C)  Recent Labs  Lab 06/06/20 0802 06/07/20 0530 06/08/20 0242 06/09/20 0100  WBC 5.4 6.4 5.4  --   CREATININE 1.07 0.95 0.83 1.12  LATICACIDVEN 1.3  --   --   --     Estimated Creatinine Clearance: 43.4 mL/min (by C-G formula based on SCr of 1.12 mg/dL).    No Known Allergies  Antimicrobials this admission: vancomycin 5/13 >>  cefepime 5/13 >>  Flagyl 5/13 >> Zosyn 5/13 x 1  Dose adjustments this admission:   Microbiology results: 5/13 BCx: ngtd  Thank you for involving pharmacy in this patient's care.  Renold Genta, PharmD, BCPS Clinical Pharmacist Clinical phone for 06/10/2020 until 3p is [phone] 06/10/2020 12:05 PM  **Pharmacist phone directory can be found on [email or web address] listed under [place]**

## 2020-06-10 NOTE — Progress Notes (Signed)
PROGRESS NOTE    [name].  [id number] DOB: 1960/06/23 DOA: 06/06/2020 PCP: Cipriano Mile, NP   Brief Narrative: Ruben Beams. is a 60 y.o. male with history of CAD, hypertension, PAD.  Patient presented secondary to pain of his left pinky toe and was found to have evidence of acute osteomyelitis via MRI.  Patient started on empiric vancomycin, cefepime, Flagyl.  Podiatry was consulted initially with recommendations for vascular surgery consult.  Vascular surgery planning aortogram with left lower extremity runoff.  Blood cultures no growth to date.   Assessment & Plan:   Principal Problem:   Osteomyelitis of fifth toe of left foot (HCC) Active Problems:   Essential hypertension, benign   Type 2 diabetes mellitus without complication, without long-term current use of insulin (HCC)   PVD (peripheral vascular disease) (HCC)   Toe infection   Protein-calorie malnutrition, severe   Tobacco use   Osteomyelitis of fifth toe of left foot Confirmed on MRI. Patient started empirically on vancomycin/cefepime/metronidazole IV. Blood cultures obtained and no growth to date. Patient initially evaluated by podiatry who recommended vascular surgery consult. Vascular surgery plan tentative angio today vs tomorrow as able. -Vascular surgery recommendations: Aortogram with LLE runoff/?intervention today -Continue vancomycin/cefepime/flagyl  Chronic wound Located on right dorsum of right foot. Wound care consulted with recommendations: 1. Add enzymatic debridement ointment and saline moist gauze. Top with dry dressing. Change daily.   Hyperlipidemia -Continue Lipitor  Primary hypertension -Continue lisinopril  Severe PAD -Continue aspirin/Plavix  Tobacco use Counseled on cessation.  Chronic pain/neuropathy -Continue gabapentin  Severe malnutrition Dietitian consulted with recommendations: 1. Add Ensure Enlive po TID, each supplement provides 350 kcal and 20  grams of protein. 2. Add Magic cup TID with meals, each supplement provides 290 kcal and 9 grams of protein. 3. Add 30 ml ProSource Plus po BID, each supplement provides 100 kcal and 15 grams of protein.  4. Add 1 packet Juven BID, each packet provides 95 calories, 2.5 grams of protein (collagen), and 9.8 grams of carbohydrate (3 grams sugar); also contains 7 grams of L-arginine and L-glutamine, 300 mg vitamin C, 15 mg vitamin E, 1.2 mcg vitamin B-12, 9.5 mg zinc, 200 mg calcium, and 1.5 g  Calcium Beta-hydroxy-Beta-methylbutyrate to support wound healing. 5. Add MVI with minerals daily.   DVT prophylaxis: Lovenox Code Status:   Code Status: Full Code Family Communication: None at bedside Disposition Plan: Discharge pending vascular surgery/podiatry recommendations and outpatient antibiotic regimen if needed   Consultants:   Podiatry  Vascular surgery  Procedures:   None  Antimicrobials:  Vancomycin IV  Cefepime IV  Metronidazole IV    Subjective: No issues overnight. Awaiting procedure. Pain is stable.  Objective: Vitals:   06/09/20 1945 06/10/20 0233 06/10/20 0726 06/10/20 1142  BP: 109/72 107/67 111/71 116/88  Pulse: 89 79 81 86  Resp: '16 16 17 18  '$ Temp: 98.6 F (37 C) 98.4 F (36.9 C) 98.6 F (37 C) 98.1 F (36.7 C)  TempSrc: Oral Oral Oral Oral  SpO2: 100% 100% 100% 100%  Weight:        Intake/Output Summary (Last 24 hours) at 06/10/2020 1210 Last data filed at 06/10/2020 0100 Gross per 24 hour  Intake 1397.79 ml  Output 1700 ml  Net -302.21 ml   Filed Weights   06/06/20 1311  Weight: 43.7 kg    Examination:  General exam: Appears calm and comfortable Respiratory system: Clear to auscultation. Respiratory effort normal. Cardiovascular system: S1 & S2  heard, RRR. No murmurs, rubs, gallops or clicks. Gastrointestinal system: Abdomen is nondistended, soft and nontender. No organomegaly or masses felt. Normal bowel sounds heard. Central nervous  system: Alert and oriented. No focal neurological deficits. Musculoskeletal: No edema. No calf tenderness. Right foot wrapped in [place] Psychiatry: Judgement and insight appear normal. Mood & affect appropriate.      Data Reviewed: I have personally reviewed following labs and imaging studies  CBC Lab Results  Component Value Date   WBC 5.4 06/08/2020   RBC 4.13 (L) 06/08/2020   HGB 11.9 (L) 06/08/2020   HCT 37.3 (L) 06/08/2020   MCV 90.3 06/08/2020   MCH 28.8 06/08/2020   PLT 266 06/08/2020   MCHC 31.9 06/08/2020   RDW 16.6 (H) 06/08/2020   LYMPHSABS 1.0 06/08/2020   MONOABS 0.8 06/08/2020   EOSABS 0.2 06/08/2020   BASOSABS 0.0 [id number]     Last metabolic panel Lab Results  Component Value Date   NA 135 06/09/2020   K 3.8 06/09/2020   CL 108 06/09/2020   CO2 23 06/09/2020   BUN 30 (H) 06/09/2020   CREATININE 1.12 06/09/2020   GLUCOSE 106 (H) 06/09/2020   GFRNONAA >60 06/09/2020   GFRAA 82 12/12/2019   CALCIUM 9.0 06/09/2020   PROT 7.7 06/07/2020   ALBUMIN 4.1 06/07/2020   LABGLOB 2.6 10/13/2018   AGRATIO 1.7 10/13/2018   BILITOT 0.6 06/07/2020   ALKPHOS 80 06/07/2020   AST 15 06/07/2020   ALT 14 06/07/2020   ANIONGAP 4 (L) 06/09/2020    CBG (last 3)  No results for input(s): GLUCAP in the last 72 hours.   GFR: Estimated Creatinine Clearance: 43.4 mL/min (by C-G formula based on SCr of 1.12 mg/dL).  Coagulation Profile: Recent Labs  Lab 06/06/20 0802  INR 1.0    Recent Results (from the past 240 hour(s))  Culture, blood (routine x 2)     Status: None (Preliminary result)   Collection Time: 06/06/20  8:02 AM   Specimen: BLOOD  Result Value Ref Range Status   Specimen Description   Final    BLOOD LEFT ANTECUBITAL Performed at [facility] [address], [place], [place] [address]    Special Requests   Final    BOTTLES DRAWN AEROBIC AND ANAEROBIC Blood Culture adequate volume Performed at [facility] [address] [place], [place] [address]    Culture   Final    NO GROWTH 4 DAYS Performed at [facility], [place] [address] [place], [place] [address]    Report Status PENDING  Incomplete  Resp Panel by RT-PCR (Flu A&B, Covid) Nasopharyngeal Swab     Status: None   Collection Time: 06/06/20  8:02 AM   Specimen: Nasopharyngeal Swab; Nasopharyngeal(NP) swabs in vial transport medium  Result Value Ref Range Status   SARS Coronavirus 2 by RT PCR NEGATIVE NEGATIVE Final    Comment: (NOTE) SARS-CoV-2 target nucleic acids are NOT DETECTED.  The SARS-CoV-2 RNA is generally detectable in upper respiratory specimens during the acute phase of infection. The lowest concentration of SARS-CoV-2 viral copies this assay can detect is 138 copies/mL. A negative result does not preclude SARS-Cov-2 infection and should not be used as the sole basis for treatment or other patient management decisions. A negative result may occur with  improper specimen collection/handling, submission of specimen other than nasopharyngeal swab, presence of viral mutation(s) within the areas targeted by this assay, and inadequate number of viral copies(<138 copies/mL). A negative  result must be combined with clinical observations, patient history, and epidemiological information. The expected result is Negative.  Fact Sheet for Patients:  EntrepreneurPulse.com.au  Fact Sheet for Healthcare Providers:  IncredibleEmployment.be  This test is no t yet approved or cleared by the [place] FDA and  has been authorized for detection and/or diagnosis of SARS-CoV-2 by FDA under an Emergency Use Authorization (EUA). This EUA will remain  in effect (meaning this test can be used) for the duration of the COVID-19 declaration under Section 564(b)(1) of the Act, 21 U.S.C.section 360bbb-3(b)(1), unless the authorization is terminated  or revoked sooner.       Influenza A by PCR  NEGATIVE NEGATIVE Final   Influenza B by PCR NEGATIVE NEGATIVE Final    Comment: (NOTE) The Xpert Xpress SARS-CoV-2/FLU/RSV plus assay is intended as an aid in the diagnosis of influenza from Nasopharyngeal swab specimens and should not be used as a sole basis for treatment. Nasal washings and aspirates are unacceptable for Xpert Xpress SARS-CoV-2/FLU/RSV testing.  Fact Sheet for Patients: EntrepreneurPulse.com.au  Fact Sheet for Healthcare Providers: IncredibleEmployment.be  This test is not yet approved or cleared by the [place] FDA and has been authorized for detection and/or diagnosis of SARS-CoV-2 by FDA under an Emergency Use Authorization (EUA). This EUA will remain in effect (meaning this test can be used) for the duration of the COVID-19 declaration under Section 564(b)(1) of the Act, 21 U.S.C. section 360bbb-3(b)(1), unless the authorization is terminated or revoked.  Performed at [facility], [place] [address], [place], [place] [address]   Culture, blood (routine x 2)     Status: None (Preliminary result)   Collection Time: 06/06/20  8:17 AM   Specimen: BLOOD  Result Value Ref Range Status   Specimen Description   Final    BLOOD LEFT ANTECUBITAL Performed at [facility] [address], [place], [place] [address]    Special Requests   Final    BOTTLES DRAWN AEROBIC AND ANAEROBIC Blood Culture adequate volume Performed at [facility] [address], [place], [place] [address]    Culture   Final    NO GROWTH 4 DAYS Performed at [facility], [place] [address] [place], [place] [address]    Report Status PENDING  Incomplete        Radiology Studies: VAS [place] LOWER EXTREMITY SAPHENOUS VEIN MAPPING  Result Date: 06/09/2020 LOWER EXTREMITY VEIN MAPPING Patient Name:  [name].  Date of Exam:   06/08/2020 Medical Rec #: [id number]              Accession  #:    [id number] Date of Birth: July 23, 1960              Patient Gender: M Patient Age:   59Y Exam Location:  [facility] Procedure:      VAS [place] LOWER EXTREMITY SAPHENOUS VEIN MAPPING Referring Phys: [id number] [place] --------------------------------------------------------------------------------  Indications:  Pre-op History:      History of PAD; patient is pre-operative for lower extremity               bypass graft. Risk Factors: Hypertension, hyperlipidemia, current smoker, coronary artery               disease, PAD.  Limitations: Constant movement secondary to ischemic pain, body habitus Comparison Study: No prior study Performing Technologist: Sharion Dove RVS  Examination Guidelines: A complete evaluation includes B-mode imaging, spectral Doppler, color  Doppler, and power Doppler as needed of all accessible portions of each vessel. Bilateral testing is considered an integral part of a complete examination. Limited examinations for reoccurring indications may be performed as noted. +---------------+-----------+----------------------+---------------+-----------+   RT Diameter  RT Findings         GSV            LT Diameter  LT Findings      (cm)                                            (cm)                  +---------------+-----------+----------------------+---------------+-----------+      0.56                     Saphenofemoral         0.30                                                   Junction                                  +---------------+-----------+----------------------+---------------+-----------+      0.34                     Proximal thigh         0.18                  +---------------+-----------+----------------------+---------------+-----------+      0.27       branching       Mid thigh            0.23                  +---------------+-----------+----------------------+---------------+-----------+      0.28                       Distal thigh          2.20       branching  +---------------+-----------+----------------------+---------------+-----------+      0.22                          Knee              0.32                  +---------------+-----------+----------------------+---------------+-----------+      0.25                       Prox calf            0.29                  +---------------+-----------+----------------------+---------------+-----------+      0.10       branching        Mid calf            0.27                  +---------------+-----------+----------------------+---------------+-----------+      0.25  Distal calf           0.27                  +---------------+-----------+----------------------+---------------+-----------+      0.18       branching         Ankle              0.23                  +---------------+-----------+----------------------+---------------+-----------+ Diagnosing physician: Monica Martinez MD Electronically signed by Monica Martinez MD on 06/09/2020 at 1:56:40 PM.    Final         Scheduled Meds: . (feeding supplement) PROSource Plus  30 mL Oral BID BM  . aspirin EC  81 mg Oral Q1400  . atorvastatin  40 mg Oral QHS  . clopidogrel  75 mg Oral Q breakfast  . collagenase   Topical Daily  . docusate sodium  100 mg Oral BID  . enoxaparin (LOVENOX) injection  30 mg Subcutaneous Daily  . feeding supplement  237 mL Oral TID BM  . gabapentin  600 mg Oral TID  . lisinopril  20 mg Oral Daily  . multivitamin with minerals  1 tablet Oral Daily  . nicotine  7 mg Transdermal Daily  . nutrition supplement (JUVEN)  1 packet Oral BID BM   Continuous Infusions: . sodium chloride 45 mL/hr at 06/09/20 1223  . sodium chloride 100 mL/hr at 06/10/20 1158  . ceFEPime (MAXIPIME) IV 2 g (06/10/20 0529)  . metronidazole 500 mg (06/10/20 1158)  . vancomycin 750 mg (06/10/20 1002)     LOS: 3 days     Cordelia Poche, MD [facility] 06/10/2020, 12:10 PM  If 7PM-7AM, please contact night-coverage [email or web address]

## 2020-06-11 ENCOUNTER — Encounter (HOSPITAL_COMMUNITY): Payer: Self-pay | Admitting: Surgery

## 2020-06-11 DIAGNOSIS — I70245 Atherosclerosis of native arteries of left leg with ulceration of other part of foot: Secondary | ICD-10-CM

## 2020-06-11 DIAGNOSIS — M869 Osteomyelitis, unspecified: Secondary | ICD-10-CM | POA: Diagnosis not present

## 2020-06-11 LAB — BASIC METABOLIC PANEL
Anion gap: 7 (ref 5–15)
BUN: 17 mg/dL (ref 6–20)
CO2: 22 mmol/L (ref 22–32)
Calcium: 9.1 mg/dL (ref 8.9–10.3)
Chloride: 107 mmol/L (ref 98–111)
Creatinine, Ser: 0.82 mg/dL (ref 0.61–1.24)
GFR, Estimated: >60 mL/min (ref >60)
Glucose, Bld: 87 mg/dL (ref 70–99)
Potassium: 3.6 mmol/L (ref 3.5–5.1)
Sodium: 136 mmol/L (ref 135–145)

## 2020-06-11 LAB — CULTURE, BLOOD (ROUTINE X 2)
Culture: NO GROWTH
Culture: NO GROWTH
Special Requests: ADEQUATE
Special Requests: ADEQUATE

## 2020-06-11 LAB — CBC
HCT: 34.2 % — ABNORMAL LOW (ref 39.0–52.0)
Hemoglobin: 10.4 g/dL — ABNORMAL LOW (ref 13.0–17.0)
MCH: 28.5 pg (ref 26.0–34.0)
MCHC: 30.4 g/dL (ref 30.0–36.0)
MCV: 93.7 fL (ref 80.0–100.0)
Platelets: 267 10*3/uL (ref 150–400)
RBC: 3.65 MIL/uL — ABNORMAL LOW (ref 4.22–5.81)
RDW: 16.9 % — ABNORMAL HIGH (ref 11.5–15.5)
WBC: 5.3 10*3/uL (ref 4.0–10.5)
nRBC: 0 % (ref 0.0–0.2)

## 2020-06-11 MED FILL — Midazolam HCl Inj 5 MG/5ML (Base Equivalent): INTRAMUSCULAR | Qty: 6 | Status: AC

## 2020-06-11 NOTE — Progress Notes (Signed)
  Subjective:  Patient ID: Ruben Beams., male    DOB: 13-May-1960,  MRN: [id number]  Feeling well his toe on the left foot is beginning to hurt him more more  Negative for chest pain and shortness of breath Review of all other systems is negative Objective:   Vitals:   06/11/20 1201 06/11/20 1600  BP: 117/75 120/70  Pulse: 83 89  Resp: 14 15  Temp: 99 F (37.2 C) 99.1 F (37.3 C)  SpO2: 100% 100%   General AA&O x3. Normal mood and affect.  Vascular  left foot is now warm and well-perfused, it is cool to touch on the fifth toe  Neurologic Epicritic sensation grossly intact.  Dermatologic  dusky appearance of skin of fifth toe on the left side, early gangrenous changes  Orthopedic:  Gross motor function intact.    Assessment & Plan:  Patient was evaluated and treated and all questions answered.  Gangrene secondary to peripheral arterial disease and left fifth toe -Now status post revascularization left lower extremity -I think the toe is likely to worsen and develop into gangrene recommended potation and he is okay with this -Discussed risks of amputation failure and further amputation if this does not work -Plan for surgery on Friday 5/20, n.p.o. past midnight  Criselda Peaches, DPM  Accessible via secure chat for questions or concerns.

## 2020-06-11 NOTE — Progress Notes (Addendum)
[facility]  Subjective  - Left toe ulcer   Objective 139/73 84 98.3 F (36.8 C) (Oral) 14 100%  Intake/Output Summary (Last 24 hours) at 06/11/2020 [id number] Last data filed at 06/11/2020 0758 Gross per 24 hour  Intake 2127.6 ml  Output 900 ml  Net 1227.6 ml   Left fifth toe dry gangrene with out change Doppler signal intact left foot Right groin soft without hematoma   Assessment/Planning: POD # 1  Stent, left common iliac artery, Stent, left superficial femoral and popliteal artery Left Lower Extremity: Left common femoral and profundofemoral artery are patent throughout the course.  Superficial femoral artery is occluded with reconstitution at the adductor canal.  The above-knee popliteal artery is very small in caliber however remains patent as does the below-knee popliteal artery.  Dominant runoff is the anterior tibial artery  Arterial inflow improved.  Plan for podiatry to manage fifth toe He is on Plavix, ASA and a Statin.     [name] 06/11/2020 9:03 AM --  Laboratory Lab Results: Recent Labs    06/11/20 0221  WBC 5.3  HGB 10.4*  HCT 34.2*  PLT 267   BMET Recent Labs    06/09/20 0100 06/11/20 0221  NA 135 136  K 3.8 3.6  CL 108 107  CO2 23 22  GLUCOSE 106* 87  BUN 30* 17  CREATININE 1.12 0.82  CALCIUM 9.0 9.1    COAG Lab Results  Component Value Date   INR 1.0 06/06/2020   No results found for: PTT   I have independently interviewed and examined patient and agree with PA assessment and plan above. Treatment of left small toe at discretion of podiatry.  Laurine Kuyper C. Donzetta Matters, MD [facility] [place] Office: [phone] Pager: [phone]

## 2020-06-11 NOTE — Care Management Important Message (Signed)
Important Message  Patient Details  Name: [name]. MRN: [id number] Date of Birth: 1960/11/12   Medicare Important Message Given:  Yes     Shelda Altes 06/11/2020, 9:36 AM

## 2020-06-11 NOTE — Progress Notes (Signed)
Nutrition Follow Up  DOCUMENTATION CODES:   Severe malnutrition in context of chronic illness  INTERVENTION:    Continue Magic cup TID with meals, each supplement provides 290 kcal and 9 grams of protein.  Add double protein portions at meals  Continue MVI with minerals daily.  NUTRITION DIAGNOSIS:   Severe Malnutrition related to chronic illness as evidenced by percent weight loss,severe fat depletion,severe muscle depletion.  Ongoing  GOAL:   Patient will meet greater than or equal to 90% of their needs   Meeting  MONITOR:   PO intake,Supplement acceptance,Labs,Weight trends,Skin,I & O's  REASON FOR ASSESSMENT:   Consult Assessment of nutrition requirement/status,Wound healing  ASSESSMENT:   60 yo male with a PMH of CAD, HTN, T2DM, HLD, and PAD who presents with possible osteomyelitis in L pinky toe.   5/17- s/p aortogram, stents fem-pop artery, L iliac artery  Plan for podiatry to manage 5th toe.   Appetite stable per patient. Last five meal completions charted as 75-100%. Does not like Ensure, Juven, or ProSource. Discussed need for increased protein to preserve lean body mass and promote wound healing. Continue with magic cup and order double protein at meals.   Admission weight: 43.7 kg   Medications: colace Labs: CBG 87-106  Diet Order:   Diet Order            Diet regular Room service appropriate? Yes; Fluid consistency: Thin  Diet effective now                EDUCATION NEEDS:  Education needs have been addressed  Skin:  Skin Assessment: Skin Integrity Issues: Skin Integrity Issues:: Other (Comment) Other: Non-pressure wounds R foot  Last BM:  5/16  Height:  Ht Readings from Last 1 Encounters:  05/10/20 '5\' 1"'$  (1.549 m)   Weight:  Wt Readings from Last 1 Encounters:  06/06/20 43.7 kg   Ideal Body Weight:  51 kg  BMI:  Body mass index is 18.21 kg/m.  Estimated Nutritional Needs:   Kcal:  1600-1800 kcal   Protein:  80-100  grams   Fluid:  >/= 1.6 L/day  Mariana Single RD, LDN Clinical Nutrition Pager listed in [place]

## 2020-06-11 NOTE — Progress Notes (Addendum)
    Subjective  - POD #1  The patient underwent percutaneous revascularization of his left superficial femoral artery occlusion with subsequent stenting as well as stenting of his left common iliac artery stenosis.  This required pedal access.  He continues to complain of pain in his left fifth toe   Physical Exam:  Cannulation sites are without complication Biphasic left anterior tibial artery Doppler signal Abdomen is soft and nontender       Assessment/Plan:  POD #1  The patient underwent successful percutaneous revascularization yesterday.  His blood flow has been optimized.  He does have residual disease within his popliteal artery below the stent, however he should have adequate blood flow at this time.  Podiatry is managing the toe.  He should continue dual antiplatelet therapy and statin therapy.  He will be scheduled for surveillance follow-up in the office in 1 month.  [name] 06/11/2020 9:11 PM --  Vitals:   06/11/20 1201 06/11/20 1600  BP: 117/75 120/70  Pulse: 83 89  Resp: 14 15  Temp: 99 F (37.2 C) 99.1 F (37.3 C)  SpO2: 100% 100%    Intake/Output Summary (Last 24 hours) at 06/11/2020 2111 Last data filed at 06/11/2020 1718 Gross per 24 hour  Intake 1920 ml  Output 1600 ml  Net 320 ml     Laboratory CBC    Component Value Date/Time   WBC 5.3 06/11/2020 0221   HGB 10.4 (L) 06/11/2020 0221   HGB 11.7 (L) 12/12/2019 1006   HCT 34.2 (L) 06/11/2020 0221   HCT 36.2 (L) 12/12/2019 1006   PLT 267 06/11/2020 0221   PLT 249 12/12/2019 1006    BMET    Component Value Date/Time   NA 136 06/11/2020 0221   NA 136 12/12/2019 1006   K 3.6 06/11/2020 0221   CL 107 06/11/2020 0221   CO2 22 06/11/2020 0221   GLUCOSE 87 06/11/2020 0221   BUN 17 06/11/2020 0221   BUN 18 12/12/2019 1006   CREATININE 0.82 06/11/2020 0221   CALCIUM 9.1 06/11/2020 0221   GFRNONAA >60 06/11/2020 0221   GFRAA 82 12/12/2019 1006    COAG Lab Results  Component  Value Date   INR 1.0 06/06/2020   No results found for: PTT  Antibiotics Anti-infectives (From admission, onward)   Start     Dose/Rate Route Frequency Ordered Stop   06/07/20 0900  vancomycin (VANCOREADY) IVPB 750 mg/150 mL        750 mg 150 mL/hr over 60 Minutes Intravenous Every 24 hours 06/06/20 1338     06/06/20 1430  metroNIDAZOLE (FLAGYL) IVPB 500 mg        500 mg 100 mL/hr over 60 Minutes Intravenous Every 8 hours 06/06/20 1341     06/06/20 1430  ceFEPIme (MAXIPIME) 2 g in sodium chloride 0.9 % 100 mL IVPB        2 g 200 mL/hr over 30 Minutes Intravenous Every 12 hours 06/06/20 1342     06/06/20 0800  piperacillin-tazobactam (ZOSYN) IVPB 3.375 g        3.375 g 100 mL/hr over 30 Minutes Intravenous  Once 06/06/20 0751 06/06/20 0856   06/06/20 0800  vancomycin (VANCOCIN) IVPB 1000 mg/200 mL premix        1,000 mg 200 mL/hr over 60 Minutes Intravenous  Once 06/06/20 0752 06/06/20 1017       V. Leia Alf, M.D., [facility] [place] Office: [phone] Pager:  [phone]

## 2020-06-11 NOTE — Progress Notes (Signed)
[facility] Progress Note  Patient: [name].    LF:[id number]  DOA: 06/06/2020     Date of Service: the patient was seen and examined on 06/11/2020  Brief hospital course: Past medical history of CAD, HTN, PAD.  Presents with complaints of left leg pain found to have acute osteomyelitis of the left fifth toe.  Vascular surgery was consulted.  Underwent angiography and stent placement.  Now podiatry planning amputation of fifth toe. Currently plan is prepared for surgery and monitor postop recovery on 5/20.  Assessment and Plan: Osteomyelitis of fifth toe of left foot Confirmed on MRI. Patient started empirically on vancomycin/cefepime/metronidazole IV. Blood cultures obtained and no growth to date. Patient initially evaluated by podiatry who recommended vascular surgery consult. Vascular surgery plan tentative angio today vs tomorrow as able. -Vascular surgery recommendations: Aortogram with LLE runoff/?intervention today -Continue vancomycin/cefepime/flagyl Amputation of the fifth toe planned on 5/20.  Chronic wound Located on right dorsum of right foot. Wound care consulted with recommendations: Add enzymatic debridement ointment and saline moist gauze. Top with dry dressing. Change daily.  Hyperlipidemia -Continue Lipitor  Primary hypertension -Continue lisinopril  Severe PAD -Continue aspirin/Plavix  Tobacco use Counseled on cessation.  Chronic pain/neuropathy -Continue gabapentin  Severe malnutrition Dietitian consulted with recommendations: AddEnsure Enlive poTID, each supplement provides 350 kcal and 20 grams of protein. AddMagic cup TID with meals, each supplement provides 290 kcal and 9 grams of protein. Add30 ml ProSource Pluspo BID, each supplement provides 100 kcal and 15 grams of protein. Add1 packet Juven BID, each packet provides 95 calories, 2.5 grams of protein (collagen), and 9.8 grams of carbohydrate (3 grams sugar); also  contains 7 grams of L-arginine and L-glutamine, 300 mg vitamin C, 15 mg vitamin E, 1.2 mcg vitamin B-12, 9.5 mg zinc, 200 mg calcium, and 1.5 g Calcium Beta-hydroxy-Beta-methylbutyrate to support wound healing. Add MVI with minerals daily.  Body mass index is 18.21 kg/m.  Nutrition Problem: Severe Malnutrition Etiology: chronic illness Interventions: Interventions: Ensure Enlive (each supplement provides 350kcal and 20 grams of protein),Magic cup,MVI,Prostat,Juven      Diet: Regular diet DVT Prophylaxis:   enoxaparin (LOVENOX) injection 30 mg Start: 06/06/20 1115    Advance goals of care discussion: Full code  Family Communication: no family was present at bedside, at the time of interview.   Disposition:  Status is: Inpatient  Remains inpatient appropriate because:Ongoing diagnostic testing needed not appropriate for outpatient work up  Dispo: The patient is from: Home              Anticipated d/c is to: SNF              Patient currently is not medically stable to d/c.   Difficult to place patient No  Subjective: No nausea no vomiting.  No fever no chills.  Continues to have pain in the left toe.  Physical Exam:  General: Appear in mild distress, no Rash; Oral Mucosa Clear, moist. no Abnormal Neck Mass Or lumps, Conjunctiva normal  Cardiovascular: S1 and S2 Present, no Murmur, Respiratory: good respiratory effort, Bilateral Air entry present and CTA, no Crackles, no wheezes Abdomen: Bowel Sound present, Soft and no tenderness Extremities: no Pedal edema, black discoloration of the left fifth toe with swelling Neurology: alert and oriented to time, place, and person affect appropriate. no new focal deficit Gait not checked due to patient safety concerns  Vitals:   06/11/20 0300 06/11/20 0755 06/11/20 1201 06/11/20 1600  BP: 115/88 139/73 117/75 120/70  Pulse:  84 83 89  Resp: '13 14 14 15  '$ Temp:  98.3 F (36.8 C) 99 F (37.2 C) 99.1 F (37.3 C)  TempSrc:  Oral  Oral Oral  SpO2: 100% 100% 100% 100%  Weight:        Intake/Output Summary (Last 24 hours) at 06/11/2020 2034 Last data filed at 06/11/2020 1718 Gross per 24 hour  Intake 1920 ml  Output 1600 ml  Net 320 ml   Filed Weights   06/06/20 1311  Weight: 43.7 kg    Data Reviewed: I have personally reviewed and interpreted daily labs, tele strips, imaging. I reviewed all nursing notes, pharmacy notes, vitals, pertinent old records I have discussed plan of care as described above with RN and patient/family.  CBC: Recent Labs  Lab 06/06/20 0802 06/07/20 0530 06/08/20 0242 06/11/20 0221  WBC 5.4 6.4 5.4 5.3  NEUTROABS 3.3  --  3.4  --   HGB 13.0 13.0 11.9* 10.4*  HCT 41.3 42.2 37.3* 34.2*  MCV 91.0 92.7 90.3 93.7  PLT 320 291 266 [id number]   Basic Metabolic Panel: Recent Labs  Lab 06/06/20 0802 06/07/20 0530 06/08/20 0242 06/09/20 0100 06/11/20 0221  NA 138 136 134* 135 136  K 4.0 4.4 3.9 3.8 3.6  CL 103 101 103 108 107  CO2 '28 28 23 23 22  '$ GLUCOSE 126* 120* 107* 106* 87  BUN 28* 29* 26* 30* 17  CREATININE 1.07 0.95 0.83 1.12 0.82  CALCIUM 9.8 9.7 9.5 9.0 9.1  MG  --   --  2.1  --   --     Studies: No results found.  Scheduled Meds: . aspirin EC  81 mg Oral Q1400  . atorvastatin  40 mg Oral QHS  . clopidogrel  75 mg Oral Q breakfast  . collagenase   Topical Daily  . docusate sodium  100 mg Oral BID  . enoxaparin (LOVENOX) injection  30 mg Subcutaneous Daily  . gabapentin  600 mg Oral TID  . lisinopril  20 mg Oral Daily  . multivitamin with minerals  1 tablet Oral Daily  . nicotine  7 mg Transdermal Daily  . sodium chloride flush  3 mL Intravenous Q12H   Continuous Infusions: . sodium chloride 45 mL/hr at 06/09/20 1223  . sodium chloride 100 mL/hr at 06/10/20 1158  . sodium chloride    . ceFEPime (MAXIPIME) IV 2 g (06/11/20 1651)  . metronidazole 500 mg (06/11/20 1344)  . vancomycin 750 mg (06/11/20 0828)   PRN Meds: sodium chloride, acetaminophen **OR**  acetaminophen, hydrALAZINE, labetalol, magnesium hydroxide, morphine injection, ondansetron (ZOFRAN) IV, oxyCODONE-acetaminophen, sodium chloride flush  Time spent: 35 minutes  Author: Berle Mull, MD [facility] 06/11/2020 8:34 PM  To reach On-call, see care teams to locate the attending and reach out via [email or web address]. Between 7PM-7AM, please contact night-coverage If you still have difficulty reaching the attending provider, please page the [facility] (Director on Call) for [facility] on amion for assistance.

## 2020-06-12 DIAGNOSIS — I70245 Atherosclerosis of native arteries of left leg with ulceration of other part of foot: Secondary | ICD-10-CM | POA: Diagnosis not present

## 2020-06-12 DIAGNOSIS — M869 Osteomyelitis, unspecified: Secondary | ICD-10-CM | POA: Diagnosis not present

## 2020-06-12 LAB — BASIC METABOLIC PANEL
Anion gap: 6 (ref 5–15)
BUN: 12 mg/dL (ref 6–20)
CO2: 25 mmol/L (ref 22–32)
Calcium: 9.1 mg/dL (ref 8.9–10.3)
Chloride: 106 mmol/L (ref 98–111)
Creatinine, Ser: 0.85 mg/dL (ref 0.61–1.24)
GFR, Estimated: >60 mL/min (ref >60)
Glucose, Bld: 101 mg/dL — ABNORMAL HIGH (ref 70–99)
Potassium: 4.1 mmol/L (ref 3.5–5.1)
Sodium: 137 mmol/L (ref 135–145)

## 2020-06-12 LAB — CBC
HCT: 32.7 % — ABNORMAL LOW (ref 39.0–52.0)
Hemoglobin: 10.4 g/dL — ABNORMAL LOW (ref 13.0–17.0)
MCH: 29 pg (ref 26.0–34.0)
MCHC: 31.8 g/dL (ref 30.0–36.0)
MCV: 91.1 fL (ref 80.0–100.0)
Platelets: 265 10*3/uL (ref 150–400)
RBC: 3.59 MIL/uL — ABNORMAL LOW (ref 4.22–5.81)
RDW: 16.9 % — ABNORMAL HIGH (ref 11.5–15.5)
WBC: 7.1 10*3/uL (ref 4.0–10.5)
nRBC: 0 % (ref 0.0–0.2)

## 2020-06-12 LAB — CREATININE, SERUM
Creatinine, Ser: 0.88 mg/dL (ref 0.61–1.24)
GFR, Estimated: >60 mL/min (ref >60)

## 2020-06-12 LAB — MAGNESIUM: Magnesium: 2.1 mg/dL (ref 1.7–2.4)

## 2020-06-12 MED ORDER — OXYCODONE-ACETAMINOPHEN 5-325 MG PO TABS
1.0000 | ORAL_TABLET | ORAL | Status: DC | PRN
  Administered 2020-06-12 – 2020-06-16 (×18): 1 via ORAL
  Filled 2020-06-12 (×18): qty 1

## 2020-06-12 MED ORDER — MORPHINE SULFATE (PF) 2 MG/ML IV SOLN
2.0000 mg | INTRAVENOUS | Status: DC | PRN
  Administered 2020-06-14: 2 mg via INTRAVENOUS
  Filled 2020-06-12: qty 1

## 2020-06-12 MED ORDER — SODIUM CHLORIDE 0.9 % IV SOLN
2.0000 g | INTRAVENOUS | Status: DC
  Administered 2020-06-12 – 2020-06-15 (×4): 2 g via INTRAVENOUS
  Filled 2020-06-12: qty 20
  Filled 2020-06-12: qty 2
  Filled 2020-06-12: qty 20
  Filled 2020-06-12 (×2): qty 2

## 2020-06-12 MED ORDER — OXYCODONE HCL 5 MG PO TABS
5.0000 mg | ORAL_TABLET | ORAL | Status: DC | PRN
  Administered 2020-06-12 – 2020-06-16 (×17): 5 mg via ORAL
  Filled 2020-06-12 (×18): qty 1

## 2020-06-12 NOTE — Progress Notes (Signed)
[facility] Progress Note  Patient: [name].    LF:[id number]  DOA: 06/06/2020     Date of Service: the patient was seen and examined on 06/12/2020  Brief hospital course: Past medical history of CAD, HTN, PAD.  Presents with complaints of left leg pain found to have acute osteomyelitis of the left fifth toe.  Vascular surgery was consulted.  Underwent angiography and stent placement.  Now podiatry planning amputation of fifth toe. Currently plan is prepared for surgery and monitor postop recovery on 5/20.  Assessment and Plan: Osteomyelitis of fifth toe of left foot Confirmed on MRI. Patient started empirically on vancomycin/cefepime/metronidazole IV. Blood cultures obtained and no growth to date. Patient initially evaluated by podiatry who recommended vascular surgery consult. Vascular surgery plan tentative angio today vs tomorrow as able. -Vascular surgery to the patient with aortogram.  SP left common iliac artery and SFA stent. -Continue vancomycin/cefepime/flagyl, will await further clarification after surgery regarding margins before narrowing antibiotics.  Requesting culture Intra-Op. Amputation of the fifth toe planned on 5/20.  Chronic wound Located on right dorsum of right foot. Wound care consulted with recommendations: Add enzymatic debridement ointment and saline moist gauze. Top with dry dressing. Change daily.  Hyperlipidemia -Continue Lipitor  Primary hypertension -Continue lisinopril  Severe PAD -Continue aspirin/Plavix  Tobacco use Counseled on cessation.  Chronic pain/neuropathy -Continue gabapentin  Severe malnutrition Dietitian consulted with recommendations: AddEnsure Enlive poTID, each supplement provides 350 kcal and 20 grams of protein. AddMagic cup TID with meals, each supplement provides 290 kcal and 9 grams of protein. Add30 ml ProSource Pluspo BID, each supplement provides 100 kcal and 15 grams of protein. Add1  packet Juven BID, each packet provides 95 calories, 2.5 grams of protein (collagen), and 9.8 grams of carbohydrate (3 grams sugar); also contains 7 grams of L-arginine and L-glutamine, 300 mg vitamin C, 15 mg vitamin E, 1.2 mcg vitamin B-12, 9.5 mg zinc, 200 mg calcium, and 1.5 g Calcium Beta-hydroxy-Beta-methylbutyrate to support wound healing. Add MVI with minerals daily.  Body mass index is 18.21 kg/m.  Nutrition Problem: Severe Malnutrition Etiology: chronic illness Interventions: Interventions: Ensure Enlive (each supplement provides 350kcal and 20 grams of protein),Magic cup,MVI,Prostat,Juven      Diet: Regular diet DVT Prophylaxis:   enoxaparin (LOVENOX) injection 30 mg Start: 06/06/20 1115    Advance goals of care discussion: Full code  Family Communication: no family was present at bedside, at the time of interview.   Disposition:  Status is: Inpatient  Remains inpatient appropriate because:Ongoing diagnostic testing needed not appropriate for outpatient work up  Dispo: The patient is from: Home              Anticipated d/c is to: SNF              Patient currently is not medically stable to d/c.   Difficult to place patient No  Subjective: Continues to have pain.  No nausea no vomiting but no fever no chills.  Physical Exam:  General: Appear in mild distress, no Rash; Oral Mucosa Clear, moist. no Abnormal Neck Mass Or lumps, Conjunctiva normal  Cardiovascular: S1 and S2 Present, no Murmur, Respiratory: good respiratory effort, Bilateral Air entry present and CTA, no Crackles, no wheezes Abdomen: Bowel Sound present, Soft and no tenderness Extremities: no Pedal edema, left leg swelling, left fifth toe black gangrenous discoloration. Neurology: alert and oriented to time, place, and person affect appropriate. no new focal deficit Gait not checked due to patient safety concerns  Vitals:   06/12/20 0358 06/12/20 0901 06/12/20 1216 06/12/20 1656  BP: 100/60  121/76 105/64 98/63  Pulse: 74 95 91 89  Resp: '12 18 16 16  '$ Temp: 98.2 F (36.8 C) 98.9 F (37.2 C) 99 F (37.2 C) 99 F (37.2 C)  TempSrc: Oral Oral Oral Oral  SpO2: 100% 100% 100% 100%  Weight:        Intake/Output Summary (Last 24 hours) at 06/12/2020 1945 Last data filed at 06/11/2020 2345 Gross per 24 hour  Intake --  Output 100 ml  Net -100 ml   Filed Weights   06/06/20 1311  Weight: 43.7 kg    Data Reviewed: I have personally reviewed and interpreted daily labs, tele strips, imaging. I reviewed all nursing notes, pharmacy notes, vitals, pertinent old records I have discussed plan of care as described above with RN and patient/family.  CBC: Recent Labs  Lab 06/06/20 0802 06/07/20 0530 06/08/20 0242 06/11/20 0221 06/12/20 0512  WBC 5.4 6.4 5.4 5.3 7.1  NEUTROABS 3.3  --  3.4  --   --   HGB 13.0 13.0 11.9* 10.4* 10.4*  HCT 41.3 42.2 37.3* 34.2* 32.7*  MCV 91.0 92.7 90.3 93.7 91.1  PLT 320 291 266 267 [id number]   Basic Metabolic Panel: Recent Labs  Lab 06/07/20 0530 06/08/20 0242 06/09/20 0100 06/11/20 0221 06/12/20 0048 06/12/20 0512  NA 136 134* 135 136  --  137  K 4.4 3.9 3.8 3.6  --  4.1  CL 101 103 108 107  --  106  CO2 '28 23 23 22  '$ --  25  GLUCOSE 120* 107* 106* 87  --  101*  BUN 29* 26* 30* 17  --  12  CREATININE 0.95 0.83 1.12 0.82 0.88 0.85  CALCIUM 9.7 9.5 9.0 9.1  --  9.1  MG  --  2.1  --   --   --  2.1    Studies: No results found.  Scheduled Meds: . aspirin EC  81 mg Oral Q1400  . atorvastatin  40 mg Oral QHS  . clopidogrel  75 mg Oral Q breakfast  . collagenase   Topical Daily  . docusate sodium  100 mg Oral BID  . enoxaparin (LOVENOX) injection  30 mg Subcutaneous Daily  . gabapentin  600 mg Oral TID  . lisinopril  20 mg Oral Daily  . multivitamin with minerals  1 tablet Oral Daily  . nicotine  7 mg Transdermal Daily  . sodium chloride flush  3 mL Intravenous Q12H   Continuous Infusions: . sodium chloride    . cefTRIAXone  (ROCEPHIN)  IV 2 g (06/12/20 0850)  . metronidazole 500 mg (06/12/20 1315)   PRN Meds: sodium chloride, acetaminophen **OR** acetaminophen, magnesium hydroxide, morphine injection, ondansetron (ZOFRAN) IV, oxyCODONE-acetaminophen **AND** oxyCODONE, sodium chloride flush  Time spent: 35 minutes  Author: Berle Mull, MD [facility] 06/12/2020 7:45 PM  To reach On-call, see care teams to locate the attending and reach out via [email or web address]. Between 7PM-7AM, please contact night-coverage If you still have difficulty reaching the attending provider, please page the [facility] (Director on Call) for [facility] on amion for assistance.

## 2020-06-12 NOTE — Progress Notes (Addendum)
  Progress Note    06/12/2020 7:42 AM 2 Days Post-Op  Subjective:  Pain in L 5th toe   Vitals:   06/11/20 2343 06/12/20 0358  BP: 113/61 100/60  Pulse: 86 74  Resp: 15 12  Temp: 98.8 F (37.1 C) 98.2 F (36.8 C)  SpO2: 100% 100%   Physical Exam: Lungs:  Non labored Incisions:  R groin cath site without hematoma Extremities:  Brisk L DP by doppler Abdomen:  Soft, nontender Neurologic: A&O  CBC    Component Value Date/Time   WBC 7.1 06/12/2020 0512   RBC 3.59 (L) 06/12/2020 0512   HGB 10.4 (L) 06/12/2020 0512   HGB 11.7 (L) 12/12/2019 1006   HCT 32.7 (L) 06/12/2020 0512   HCT 36.2 (L) 12/12/2019 1006   PLT 265 06/12/2020 0512   PLT 249 12/12/2019 1006   MCV 91.1 06/12/2020 0512   MCV 90 12/12/2019 1006   MCH 29.0 06/12/2020 0512   MCHC 31.8 06/12/2020 0512   RDW 16.9 (H) 06/12/2020 0512   RDW 14.5 12/12/2019 1006   LYMPHSABS 1.0 06/08/2020 0242   LYMPHSABS 1.5 07/31/2019 1401   MONOABS 0.8 06/08/2020 0242   EOSABS 0.2 06/08/2020 0242   EOSABS 0.2 07/31/2019 1401   BASOSABS 0.0 06/08/2020 0242   BASOSABS 0.0 07/31/2019 1401    BMET    Component Value Date/Time   NA 137 06/12/2020 0512   NA 136 12/12/2019 1006   K 4.1 06/12/2020 0512   CL 106 06/12/2020 0512   CO2 25 06/12/2020 0512   GLUCOSE 101 (H) 06/12/2020 0512   BUN 12 06/12/2020 0512   BUN 18 12/12/2019 1006   CREATININE 0.85 06/12/2020 0512   CALCIUM 9.1 06/12/2020 0512   GFRNONAA >60 06/12/2020 0512   GFRAA 82 12/12/2019 1006    INR    Component Value Date/Time   INR 1.0 06/06/2020 0802     Intake/Output Summary (Last 24 hours) at 06/12/2020 0742 Last data filed at 06/11/2020 2345 Gross per 24 hour  Intake 1920 ml  Output 1700 ml  Net 220 ml     Assessment/Plan:  60 y.o. male is s/p L CIA and SFA stent 2 Days Post-Op   L foot well perfused with brisk L DP signal by doppler R groin cath site is soft without hematoma Continue aspirin, plavix, statin Follow up in 4-6 weeks  with imaging studies; office will call to arrange Plans noted for L 5th toe amp with Dr. Sherryle Lis tomorrow    Dagoberto Ligas, PA-C [facility] [phone] 06/12/2020 7:42 AM   I have independently interviewed and examined patient and agree with PA assessment and plan above.  Noted plans for podiatry fifth toe amputation tomorrow.  Vascular surgery will be available as needed.  Tinnie Kunin C. Donzetta Matters, MD [facility] [place] Office: [phone] Pager: [phone]

## 2020-06-13 ENCOUNTER — Inpatient Hospital Stay (HOSPITAL_COMMUNITY): Payer: Medicare Other

## 2020-06-13 ENCOUNTER — Inpatient Hospital Stay (HOSPITAL_COMMUNITY): Payer: Medicare Other | Admitting: Anesthesiology

## 2020-06-13 ENCOUNTER — Encounter (HOSPITAL_COMMUNITY)
Admission: EM | Disposition: A | Discharge status: Home Health | Payer: Self-pay | Source: Home / Self Care | Attending: Internal Medicine

## 2020-06-13 ENCOUNTER — Encounter (HOSPITAL_BASED_OUTPATIENT_CLINIC_OR_DEPARTMENT_OTHER): Payer: Medicare Other | Admitting: Internal Medicine

## 2020-06-13 ENCOUNTER — Encounter (HOSPITAL_COMMUNITY): Payer: Self-pay | Admitting: Internal Medicine

## 2020-06-13 ENCOUNTER — Other Ambulatory Visit: Payer: Self-pay | Admitting: *Deleted

## 2020-06-13 DIAGNOSIS — M869 Osteomyelitis, unspecified: Secondary | ICD-10-CM

## 2020-06-13 DIAGNOSIS — I70229 Atherosclerosis of native arteries of extremities with rest pain, unspecified extremity: Secondary | ICD-10-CM

## 2020-06-13 DIAGNOSIS — I739 Peripheral vascular disease, unspecified: Secondary | ICD-10-CM

## 2020-06-13 HISTORY — PX: AMPUTATION TOE: SHX6595

## 2020-06-13 LAB — GLUCOSE, CAPILLARY: Glucose-Capillary: 75 mg/dL (ref 70–99)

## 2020-06-13 LAB — SURGICAL PCR SCREEN
MRSA, PCR: NEGATIVE
Staphylococcus aureus: NEGATIVE

## 2020-06-13 SURGERY — AMPUTATION, TOE
Anesthesia: Monitor Anesthesia Care | Site: Fifth Toe | Laterality: Left

## 2020-06-13 MED ORDER — CHLORHEXIDINE GLUCONATE 0.12 % MT SOLN: 15.0000 mL | Freq: Once | OROMUCOSAL | Status: AC

## 2020-06-13 MED ORDER — MIDAZOLAM HCL 5 MG/5ML IJ SOLN
INTRAMUSCULAR | Status: DC | PRN
  Administered 2020-06-13 (×2): 1 mg via INTRAVENOUS

## 2020-06-13 MED ORDER — PHENYLEPHRINE 40 MCG/ML (10ML) SYRINGE FOR IV PUSH (FOR BLOOD PRESSURE SUPPORT)
PREFILLED_SYRINGE | INTRAVENOUS | Status: DC | PRN
  Administered 2020-06-13 (×2): 80 ug via INTRAVENOUS

## 2020-06-13 MED ORDER — FENTANYL CITRATE (PF) 100 MCG/2ML IJ SOLN
INTRAMUSCULAR | Status: DC | PRN
  Administered 2020-06-13 (×2): 50 ug via INTRAVENOUS

## 2020-06-13 MED ORDER — MIDAZOLAM HCL 2 MG/2ML IJ SOLN
INTRAMUSCULAR | Status: AC
  Filled 2020-06-13: qty 2

## 2020-06-13 MED ORDER — OXYCODONE HCL 5 MG PO TABS
5.0000 mg | ORAL_TABLET | Freq: Once | ORAL | Status: DC | PRN
Start: 2020-06-13 — End: 2020-06-13

## 2020-06-13 MED ORDER — PROPOFOL 10 MG/ML IV BOLUS
INTRAVENOUS | Status: DC | PRN
  Administered 2020-06-13: 30 mg via INTRAVENOUS

## 2020-06-13 MED ORDER — FENTANYL CITRATE (PF) 250 MCG/5ML IJ SOLN
INTRAMUSCULAR | Status: AC
  Filled 2020-06-13: qty 5

## 2020-06-13 MED ORDER — PROPOFOL 500 MG/50ML IV EMUL
INTRAVENOUS | Status: DC | PRN
  Administered 2020-06-13: 100 ug/kg/min via INTRAVENOUS

## 2020-06-13 MED ORDER — ORAL CARE MOUTH RINSE: 15.0000 mL | Freq: Once | OROMUCOSAL | Status: AC

## 2020-06-13 MED ORDER — CHLORHEXIDINE GLUCONATE 0.12 % MT SOLN
OROMUCOSAL | Status: AC
  Administered 2020-06-13: 15 mL via OROMUCOSAL
  Filled 2020-06-13: qty 15

## 2020-06-13 MED ORDER — 0.9 % SODIUM CHLORIDE (POUR BTL) OPTIME
TOPICAL | Status: DC | PRN
  Administered 2020-06-13: 1000 mL

## 2020-06-13 MED ORDER — BUPIVACAINE HCL (PF) 0.25 % IJ SOLN
INTRAMUSCULAR | Status: DC | PRN
  Administered 2020-06-13: 10 mL

## 2020-06-13 MED ORDER — LACTATED RINGERS IV SOLN: INTRAVENOUS | Status: DC

## 2020-06-13 MED ORDER — BUPIVACAINE HCL (PF) 0.25 % IJ SOLN
INTRAMUSCULAR | Status: AC
  Filled 2020-06-13: qty 30

## 2020-06-13 MED ORDER — PROMETHAZINE HCL 25 MG/ML IJ SOLN: 6.2500 mg | INTRAMUSCULAR | Status: DC | PRN

## 2020-06-13 MED ORDER — FENTANYL CITRATE (PF) 100 MCG/2ML IJ SOLN: 25.0000 ug | INTRAMUSCULAR | Status: DC | PRN

## 2020-06-13 MED ORDER — OXYCODONE HCL 5 MG/5ML PO SOLN: 5.0000 mg | Freq: Once | ORAL | Status: DC | PRN

## 2020-06-13 SURGICAL SUPPLY — 54 items
APL PRP STRL LF DISP 70% ISPRP (MISCELLANEOUS) ×2
BLADE LONG MED 31X9 (MISCELLANEOUS) IMPLANT
BNDG CMPR 9X4 STRL LF SNTH (GAUZE/BANDAGES/DRESSINGS)
BNDG ELASTIC 4X5.8 VLCR STR LF (GAUZE/BANDAGES/DRESSINGS) IMPLANT
BNDG ESMARK 4X9 LF (GAUZE/BANDAGES/DRESSINGS) IMPLANT
BNDG GAUZE ELAST 4 BULKY (GAUZE/BANDAGES/DRESSINGS) ×2 IMPLANT
CHLORAPREP W/TINT 26 (MISCELLANEOUS) ×3 IMPLANT
COVER SURGICAL LIGHT HANDLE (MISCELLANEOUS) ×3 IMPLANT
COVER WAND RF STERILE (DRAPES) ×3 IMPLANT
CUFF TOURN SGL QUICK 18X4 (TOURNIQUET CUFF) IMPLANT
CUFF TOURN SGL QUICK 24 (TOURNIQUET CUFF)
CUFF TOURN SGL QUICK 34 (TOURNIQUET CUFF)
CUFF TRNQT CYL 24X4X16.5-23 (TOURNIQUET CUFF) IMPLANT
CUFF TRNQT CYL 34X4.125X (TOURNIQUET CUFF) IMPLANT
DRAPE SURG 17X23 STRL (DRAPES) ×3 IMPLANT
DRAPE U-SHAPE 47X51 STRL (DRAPES) ×3 IMPLANT
ELECT CAUTERY BLADE 6.4 (BLADE) ×3 IMPLANT
ELECT REM PT RETURN 9FT ADLT (ELECTROSURGICAL) ×3
ELECTRODE REM PT RTRN 9FT ADLT (ELECTROSURGICAL) ×2 IMPLANT
GAUZE SPONGE 2X2 8PLY STRL LF (GAUZE/BANDAGES/DRESSINGS) IMPLANT
GAUZE SPONGE 4X4 12PLY STRL (GAUZE/BANDAGES/DRESSINGS) ×2 IMPLANT
GAUZE XEROFORM 1X8 LF (GAUZE/BANDAGES/DRESSINGS) ×2 IMPLANT
GLOVE BIOGEL M 7.0 STRL (GLOVE) ×3 IMPLANT
GLOVE BIOGEL PI ORTHO PRO 7.5 (GLOVE) ×1
GLOVE PI ORTHO PRO STRL 7.5 (GLOVE) ×2 IMPLANT
GLOVE SURG UNDER POLY LF SZ7.5 (GLOVE) ×3 IMPLANT
GOWN STRL REUS W/ TWL LRG LVL3 (GOWN DISPOSABLE) ×4 IMPLANT
GOWN STRL REUS W/TWL LRG LVL3 (GOWN DISPOSABLE) ×6
KIT BASIN OR (CUSTOM PROCEDURE TRAY) ×3 IMPLANT
KIT TURNOVER KIT B (KITS) ×3 IMPLANT
MANIFOLD NEPTUNE II (INSTRUMENTS) ×3 IMPLANT
NDL HYPO 25GX1X1/2 BEV (NEEDLE) IMPLANT
NEEDLE HYPO 25GX1X1/2 BEV (NEEDLE) IMPLANT
NS IRRIG 1000ML POUR BTL (IV SOLUTION) ×3 IMPLANT
PACK ORTHO EXTREMITY (CUSTOM PROCEDURE TRAY) ×3 IMPLANT
PAD ARMBOARD 7.5X6 YLW CONV (MISCELLANEOUS) ×6 IMPLANT
PROBE DEBRIDE SONICVAC MISONIX (TIP) IMPLANT
SET CYSTO W/LG BORE CLAMP LF (SET/KITS/TRAYS/PACK) ×3 IMPLANT
SOL PREP POV-IOD 4OZ 10% (MISCELLANEOUS) ×9 IMPLANT
SPECIMEN JAR SMALL (MISCELLANEOUS) ×3 IMPLANT
SPONGE GAUZE 2X2 STER 10/PKG (GAUZE/BANDAGES/DRESSINGS)
SUT ETHILON 3 0 PS 1 (SUTURE) ×3 IMPLANT
SUT ETHILON 4 0 PS 2 18 (SUTURE) ×2 IMPLANT
SUT MNCRL AB 3-0 PS2 18 (SUTURE) ×5 IMPLANT
SWAB COLLECTION DEVICE MRSA (MISCELLANEOUS) ×2 IMPLANT
SWAB CULTURE ESWAB REG 1ML (MISCELLANEOUS) ×2 IMPLANT
SYR CONTROL 10ML LL (SYRINGE) IMPLANT
TAPE PAPER 3X10 WHT MICROPORE (GAUZE/BANDAGES/DRESSINGS) ×2 IMPLANT
TOWEL GREEN STERILE (TOWEL DISPOSABLE) ×3 IMPLANT
TOWEL GREEN STERILE FF (TOWEL DISPOSABLE) ×3 IMPLANT
TUBE CONNECTING 12X1/4 (SUCTIONS) ×3 IMPLANT
UNDERPAD 30X36 HEAVY ABSORB (UNDERPADS AND DIAPERS) ×3 IMPLANT
WATER STERILE IRR 1000ML POUR (IV SOLUTION) ×3 IMPLANT
YANKAUER SUCT BULB TIP NO VENT (SUCTIONS) ×3 IMPLANT

## 2020-06-13 NOTE — Anesthesia Preprocedure Evaluation (Addendum)
Anesthesia Evaluation  Patient identified by MRN, date of birth, ID band Patient awake    Reviewed: Allergy & Precautions, NPO status , Patient's Chart, lab work & pertinent test results  Airway Mallampati: II  TM Distance: >3 FB Neck ROM: Full    Dental  (+) Edentulous Upper, Edentulous Lower   Pulmonary Current Smoker and Patient abstained from smoking.,    Pulmonary exam normal breath sounds clear to auscultation       Cardiovascular Exercise Tolerance: Good hypertension, + Peripheral Vascular Disease  Normal cardiovascular exam Rhythm:Regular Rate:Normal     Neuro/Psych negative neurological ROS  negative psych ROS   GI/Hepatic negative GI ROS, (+)     substance abuse  marijuana use,   Endo/Other  diabetes, Poorly Controlled  Renal/GU   negative genitourinary   Musculoskeletal negative musculoskeletal ROS (+)   Abdominal   Peds negative pediatric ROS (+)  Hematology  (+) anemia ,   Anesthesia Other Findings   Reproductive/Obstetrics negative OB ROS                            Anesthesia Physical Anesthesia Plan  ASA: III  Anesthesia Plan: MAC   Post-op Pain Management:    Induction: Intravenous  PONV Risk Score and Plan: TIVA, Propofol infusion and Treatment may vary due to age or medical condition  Airway Management Planned: Natural Airway and Simple Face Mask  Additional Equipment: None  Intra-op Plan:   Post-operative Plan:   Informed Consent: I have reviewed the patients History and Physical, chart, labs and discussed the procedure including the risks, benefits and alternatives for the proposed anesthesia with the patient or authorized representative who has indicated his/her understanding and acceptance.       Plan Discussed with: CRNA and Anesthesiologist  Anesthesia Plan Comments:        Anesthesia Quick Evaluation

## 2020-06-13 NOTE — Progress Notes (Signed)
History and Physical Interval Note:  06/13/2020 1:08 PM  [name]  has presented today for surgery, with the diagnosis of gangrene left 5th toe .  The various methods of treatment have been discussed with the patient and family. After consideration of risks, benefits and other options for treatment, the patient has consented to  Left fifth toe amputation  as a surgical intervention.  The patient's history has been reviewed, patient examined, no change in status, stable for surgery.  I have reviewed the patient's chart and labs.  Questions were answered to the patient's satisfaction.     [name]

## 2020-06-13 NOTE — Progress Notes (Signed)
[facility] Progress Note  Patient: [name].    LF:[id number]  DOA: 06/06/2020     Date of Service: the patient was seen and examined on 06/13/2020  Brief hospital course: Past medical history of CAD, HTN, PAD.  Presents with complaints of left leg pain found to have acute osteomyelitis of the left fifth toe.  Vascular surgery was consulted. Underwent angiography and stent placement. Now podiatry planning amputation of fifth toe. Currently plan is prepared for surgery and monitor postop recovery on 5/20.  Assessment and Plan: Osteomyelitis of fifth toe of left foot Confirmed on MRI. Patient started empirically on vancomycin/cefepime/metronidazole IV. Blood cultures obtained and no growth to date. Patient initially evaluated by podiatry who recommended vascular surgery consult. Vascular surgery plan tentative angio today vs tomorrow as able. -Vascular surgery to the patient with aortogram.  SP left common iliac artery and SFA stent. -Continue vancomycin/cefepime/flagyl, will await further clarification after surgery regarding margins before narrowing antibiotics.  Requesting culture Intra-Op. Amputation of the fifth toe planned on 5/20.  Surgical cultures have been sent as well as bone has been sent for pathology.  Appreciate assistance.  Based on the surgical report likely will not require antibiotic long-term.  Chronic wound Located on right dorsum of right foot. Wound care consulted with recommendations: Add enzymatic debridement ointment and saline moist gauze. Top with dry dressing. Change daily.  Hyperlipidemia -Continue Lipitor  Primary hypertension -Continue lisinopril  Severe PAD -Continue aspirin/Plavix  Tobacco use Counseled on cessation.  Chronic pain/neuropathy -Continue gabapentin  Severe malnutrition Dietitian consulted with recommendations: AddEnsure Enlive poTID, each supplement provides 350 kcal and 20 grams of protein. AddMagic cup  TID with meals, each supplement provides 290 kcal and 9 grams of protein. Add30 ml ProSource Pluspo BID, each supplement provides 100 kcal and 15 grams of protein. Add1 packet Juven BID, each packet provides 95 calories, 2.5 grams of protein (collagen), and 9.8 grams of carbohydrate (3 grams sugar); also contains 7 grams of L-arginine and L-glutamine, 300 mg vitamin C, 15 mg vitamin E, 1.2 mcg vitamin B-12, 9.5 mg zinc, 200 mg calcium, and 1.5 g Calcium Beta-hydroxy-Beta-methylbutyrate to support wound healing. Add MVI with minerals daily.  Body mass index is 18.21 kg/m.  Nutrition Problem: Severe Malnutrition Etiology: chronic illness Interventions: Interventions: Ensure Enlive (each supplement provides 350kcal and 20 grams of protein),Magic cup,MVI,Prostat,Juven  Diet: Regular diet DVT Prophylaxis:   enoxaparin (LOVENOX) injection 30 mg Start: 06/06/20 1115   Advance goals of care discussion: Full code  Family Communication: no family was present at bedside, at the time of interview.   Disposition:  Status is: Inpatient  Remains inpatient appropriate because:Ongoing diagnostic testing needed not appropriate for outpatient work up  Dispo: The patient is from: Home              Anticipated d/c is to: SNF              Patient currently is not medically stable to d/c.   Difficult to place patient No  Subjective: Seen before the surgery.  Continues to have some pain.  No nausea no vomiting.  Physical Exam:  General: Appear in mild distress; no visible Abnormal Neck Mass Or lumps, Conjunctiva normal Cardiovascular: S1 and S2 Present, no Murmur, Respiratory: good respiratory effort, Bilateral Air entry present and CTA, no Crackles, no wheezes Abdomen: Bowel Sound present Extremities: no Pedal edema Neurology: alert and oriented to time, place, and person Gait not checked due to patient safety concerns  Vitals:   06/13/20 1422 06/13/20 1437 06/13/20 1459 06/13/20 1956  BP:  109/65 113/65 114/76 118/75  Pulse: 78 79 76 97  Resp: '13 12 16 16  '$ Temp:  97.8 F (36.6 C) 98.6 F (37 C) 99.5 F (37.5 C)  TempSrc:   Oral Oral  SpO2: 100% 100% 100% 100%  Weight:        Intake/Output Summary (Last 24 hours) at 06/13/2020 2004 Last data filed at 06/13/2020 1350 Gross per 24 hour  Intake 500 ml  Output 400 ml  Net 100 ml   Filed Weights   06/06/20 1311  Weight: 43.7 kg    Data Reviewed: I have personally reviewed and interpreted daily labs, tele strips, imaging. I reviewed all nursing notes, pharmacy notes, vitals, pertinent old records I have discussed plan of care as described above with RN and patient/family.  CBC: Recent Labs  Lab 06/07/20 0530 06/08/20 0242 06/11/20 0221 06/12/20 0512  WBC 6.4 5.4 5.3 7.1  NEUTROABS  --  3.4  --   --   HGB 13.0 11.9* 10.4* 10.4*  HCT 42.2 37.3* 34.2* 32.7*  MCV 92.7 90.3 93.7 91.1  PLT 291 266 267 [id number]   Basic Metabolic Panel: Recent Labs  Lab 06/07/20 0530 06/08/20 0242 06/09/20 0100 06/11/20 0221 06/12/20 0048 06/12/20 0512  NA 136 134* 135 136  --  137  K 4.4 3.9 3.8 3.6  --  4.1  CL 101 103 108 107  --  106  CO2 '28 23 23 22  '$ --  25  GLUCOSE 120* 107* 106* 87  --  101*  BUN 29* 26* 30* 17  --  12  CREATININE 0.95 0.83 1.12 0.82 0.88 0.85  CALCIUM 9.7 9.5 9.0 9.1  --  9.1  MG  --  2.1  --   --   --  2.1    Studies: DG Foot 2 Views Left  Result Date: 06/13/2020 CLINICAL DATA:  Postoperative status. EXAM: LEFT FOOT - 2 VIEW COMPARISON:  Jun 06, 2020. FINDINGS: Status post surgical amputation of right fifth toe. No fracture or dislocation is noted. IMPRESSION: Status post surgical amputation of right fifth toe. Electronically Signed   By: Marijo Conception M.D.   On: 06/13/2020 18:02    Scheduled Meds: . aspirin EC  81 mg Oral Q1400  . atorvastatin  40 mg Oral QHS  . clopidogrel  75 mg Oral Q breakfast  . collagenase   Topical Daily  . docusate sodium  100 mg Oral BID  . enoxaparin (LOVENOX)  injection  30 mg Subcutaneous Daily  . gabapentin  600 mg Oral TID  . lisinopril  20 mg Oral Daily  . multivitamin with minerals  1 tablet Oral Daily  . nicotine  7 mg Transdermal Daily  . sodium chloride flush  3 mL Intravenous Q12H   Continuous Infusions: . sodium chloride    . cefTRIAXone (ROCEPHIN)  IV 2 g (06/13/20 0955)  . metronidazole 500 mg (06/13/20 1152)   PRN Meds: sodium chloride, acetaminophen **OR** acetaminophen, magnesium hydroxide, morphine injection, ondansetron (ZOFRAN) IV, oxyCODONE-acetaminophen **AND** oxyCODONE, sodium chloride flush  Time spent: 35 minutes  Author: Berle Mull, MD [facility] 06/13/2020 8:04 PM  To reach On-call, see care teams to locate the attending and reach out via [email or web address]. Between 7PM-7AM, please contact night-coverage If you still have difficulty reaching the attending provider, please page the [facility] (Director on Call) for [facility] on amion for assistance.

## 2020-06-13 NOTE — Op Note (Signed)
Patient Name: [name]. DOB: [birth year]  MRN: [id number]   Date of Service: 06/06/2020 - 06/13/2020  Surgeon: Dr. Lanae Crumbly, DPM Assistants: None Pre-operative Diagnosis:  PAD Gangrene left fifth toe Post-operative Diagnosis:  PAD Gangrene left fifth toe Procedures:  1) amputation 5th toe MPJ Pathology/Specimens: ID Type Source Tests Collected by Time Destination  1 : left Fifth Toe Amputation Tissue PATH Digit amputation SURGICAL PATHOLOGY Criselda Peaches, DPM 06/13/2020 1354   A : Abcess of fifth toe left  Body Fluid PATH Other AEROBIC/ANAEROBIC CULTURE W GRAM STAIN (SURGICAL/DEEP WOUND) Criselda Peaches, DPM 06/13/2020 1355    Anesthesia: MAC Hemostasis: * No tourniquets in log * Estimated Blood Loss: <10cc Materials: * No implants in log * Medications: 10cc 0.25% marcaine plain Complications: none  Indications for Procedure:  This is a 60 y.o. male with a history of PAD with gangrene of the left 5th toe. Amputation was recommended following revascularizations with vascular surgery.   Procedure in Detail: Patient was identified in pre-operative holding area. Formal consent was signed and the left lower extremity was marked. Patient was brought back to the operating room. Anesthesia was induced. The extremity was prepped and draped in the usual sterile fashion. Timeout was taken to confirm patient name, laterality, and procedure prior to incision.   Attention was then directed to the left fifth toe where an incision was made in a racket shape. Dissection was carried down to level of bone.  Dissection was continued to the metatarsophalangeal joint and all collateral ligaments were freed at the joint.  Some purulence was encountered and this was sent for culture. The bone soft tissue attachments of the proximal phalanx were removed and passed for pathology.  The remaining metatarsal head appeared healthy and viable.  The area was copiously irrigated.  The skin was  reapproximated with nylon and monocryl.   The foot was then dressed with xeroform, dry sterile gauze dressings. Patient tolerated the procedure well.   Disposition: Following a period of post-operative monitoring, patient will be transferred to the floow .

## 2020-06-13 NOTE — Transfer of Care (Signed)
Immediate Anesthesia Transfer of Care Note  Patient: [name].  Procedure(s) Performed: Left Fifth Toe Amputation  (Left Fifth Toe)  Patient Location: PACU  Anesthesia Type:MAC  Level of Consciousness: awake  Airway & Oxygen Therapy: Patient Spontanous Breathing and Patient connected to face mask oxygen  Post-op Assessment: Report given to RN and Post -op Vital signs reviewed and stable  Post vital signs: Reviewed and stable  Last Vitals:  Vitals Value Taken Time  BP 101/62 06/13/20 1407  Temp 36.6 C 06/13/20 1405  Pulse 81 06/13/20 1408  Resp 14 06/13/20 1408  SpO2 100 % 06/13/20 1408  Vitals shown include unvalidated device data.  Last Pain:  Vitals:   06/13/20 1313  TempSrc:   PainSc: 4       Patients Stated Pain Goal: 2 (56/38/93 7342)  Complications: No complications documented.

## 2020-06-13 NOTE — Anesthesia Postprocedure Evaluation (Signed)
Anesthesia Post Note  Patient: [name].  Procedure(s) Performed: Left Fifth Toe Amputation  (Left Fifth Toe)     Patient location during evaluation: PACU Anesthesia Type: MAC Level of consciousness: awake and alert Pain management: pain level controlled Vital Signs Assessment: post-procedure vital signs reviewed and stable Respiratory status: spontaneous breathing and respiratory function stable Cardiovascular status: stable Postop Assessment: no apparent nausea or vomiting Anesthetic complications: no   No complications documented.  Last Vitals:  Vitals:   06/13/20 1437 06/13/20 1459  BP: 113/65 114/76  Pulse: 79 76  Resp: 12 16  Temp: 36.6 C 37 C  SpO2: 100% 100%    Last Pain:  Vitals:   06/13/20 1459  TempSrc: Oral  PainSc:                  [name]

## 2020-06-13 NOTE — Anesthesia Procedure Notes (Signed)
Procedure Name: MAC Date/Time: 06/13/2020 1:42 PM Performed by: Lieutenant Diego, CRNA Pre-anesthesia Checklist: Patient identified, Emergency Drugs available, Suction available, Patient being monitored and Timeout performed Patient Re-evaluated:Patient Re-evaluated prior to induction Oxygen Delivery Method: Simple face mask Preoxygenation: Pre-oxygenation with 100% oxygen Induction Type: IV induction

## 2020-06-13 NOTE — Care Management Important Message (Signed)
Important Message  Patient Details  Name: [name]. MRN: [id number] Date of Birth: 1960/02/02   Medicare Important Message Given:  Yes     Shelda Altes 06/13/2020, 8:27 AM

## 2020-06-14 ENCOUNTER — Encounter (HOSPITAL_COMMUNITY): Payer: Self-pay | Admitting: Podiatry

## 2020-06-14 DIAGNOSIS — M869 Osteomyelitis, unspecified: Secondary | ICD-10-CM | POA: Diagnosis not present

## 2020-06-14 LAB — CBC
HCT: 29.7 % — ABNORMAL LOW (ref 39.0–52.0)
Hemoglobin: 9.5 g/dL — ABNORMAL LOW (ref 13.0–17.0)
MCH: 29.1 pg (ref 26.0–34.0)
MCHC: 32 g/dL (ref 30.0–36.0)
MCV: 90.8 fL (ref 80.0–100.0)
Platelets: 280 10*3/uL (ref 150–400)
RBC: 3.27 MIL/uL — ABNORMAL LOW (ref 4.22–5.81)
RDW: 16.9 % — ABNORMAL HIGH (ref 11.5–15.5)
WBC: 6.8 10*3/uL (ref 4.0–10.5)
nRBC: 0 % (ref 0.0–0.2)

## 2020-06-14 LAB — BASIC METABOLIC PANEL
Anion gap: 6 (ref 5–15)
BUN: 17 mg/dL (ref 6–20)
CO2: 26 mmol/L (ref 22–32)
Calcium: 8.9 mg/dL (ref 8.9–10.3)
Chloride: 103 mmol/L (ref 98–111)
Creatinine, Ser: 1.11 mg/dL (ref 0.61–1.24)
GFR, Estimated: >60 mL/min (ref >60)
Glucose, Bld: 99 mg/dL (ref 70–99)
Potassium: 4 mmol/L (ref 3.5–5.1)
Sodium: 135 mmol/L (ref 135–145)

## 2020-06-14 LAB — MAGNESIUM: Magnesium: 2 mg/dL (ref 1.7–2.4)

## 2020-06-14 NOTE — Progress Notes (Signed)
  Subjective:  Patient ID: Ruben Beams., male    DOB: 1960/02/11,  MRN: [id number]  Doing well, having some pain in the toe, has been up for PT and using the shoe  Negative for chest pain and shortness of breath Review of all other systems is negative Objective:   Vitals:   06/14/20 0841 06/14/20 1135  BP: 128/73 124/68  Pulse: 96 93  Resp: 16 20  Temp: 98.6 F (37 C) 99.3 F (37.4 C)  SpO2: 100% 99%   General AA&O x3. Normal mood and affect.  Vascular  left foot is now warm and well-perfused,   Neurologic Epicritic sensation grossly intact.  Dermatologic  dressing is clean dry and intact  Orthopedic:  Gross motor function intact.    Assessment & Plan:  Patient was evaluated and treated and all questions answered.  Gangrene secondary to peripheral arterial disease and left fifth toe -POD #1 L 5th toe amputation -Culture with GNRs. Recommend 2 weeks PO abx per susceptibilities -No dressing changes needed for L foot unless saturated, then change with dry sterile gauze dressings -WBAT in darco shoe. Focus weight to heel, rest as much as possible -F/U with me in 1-2 weeks in [place] office. Will have scheduling team reach out to him   Criselda Peaches, DPM  Accessible via secure chat for questions or concerns.

## 2020-06-14 NOTE — Progress Notes (Signed)
Orthopedic Tech Progress Note Patient Details:  [name]. 02/09/1960 [id number]  Ortho Devices Type of Ortho Device: Postop shoe/boot Ortho Device/Splint Location: LLE Ortho Device/Splint Interventions: Application,Ordered   Post Interventions Patient Tolerated: Well Instructions Provided: [facility] [place] 06/14/2020, 8:07 AM

## 2020-06-14 NOTE — Progress Notes (Signed)
[facility] Progress Note  Patient: [name].    [id number]  DOA: 06/06/2020     Date of Service: the patient was seen and examined on 06/14/2020  Brief hospital course: Past medical history of CAD, HTN, PAD.  Presents with complaints of left leg pain found to have acute osteomyelitis of the left fifth toe.  Vascular surgery was consulted. Underwent angiography and stent placement. Now podiatry planning amputation of fifth toe. Currently plan is prepared for surgery and monitor postop recovery on 5/20.  Assessment and Plan: Osteomyelitis of fifth toe of left foot Confirmed on MRI. Patient started empirically on vancomycin/cefepime/metronidazole IV. Blood cultures obtained and no growth to date.  Patient initially evaluated by podiatry who recommended vascular surgery consult.  -Vascular surgery took the patient with aortogram.  SP left common iliac artery and SFA stent. -Continue vancomycin/cefepime/flagyl,  Amputation of the fifth toe planned on 5/20.   -Surgical cultures growing gram-negative rods. Appreciate assistance.  Plan is to continue antibiotics for 2 more weeks based on sensitivity.  Chronic right foot wound Located on right dorsum of right foot. Wound care consulted with recommendations: Add enzymatic debridement ointment and saline moist gauze. Top with dry dressing. Change daily.  Hyperlipidemia -Continue Lipitor  Primary hypertension -Continue lisinopril  Severe PAD -Continue aspirin/Plavix  Tobacco use Counseled on cessation.  Chronic pain/neuropathy -Continue gabapentin  Severe malnutrition Dietitian consulted with recommendations: Continue supplements.  Body mass index is 18.21 kg/m.  Nutrition Problem: Severe Malnutrition Etiology: chronic illness Interventions: Interventions: Ensure Enlive (each supplement provides 350kcal and 20 grams of protein),Magic cup,MVI,Prostat,Juven  Diet: Regular diet DVT Prophylaxis:    enoxaparin (LOVENOX) injection 30 mg Start: 06/06/20 1115   Advance goals of care discussion: Full code  Family Communication: no family was present at bedside, at the time of interview.   Disposition:  Status is: Inpatient  Remains inpatient appropriate because:Ongoing diagnostic testing needed not appropriate for outpatient work up  Dispo: The patient is from: Home              Anticipated d/c is to: SNF              Patient currently is not medically stable to d/c.   Difficult to place patient No  Subjective: No acute complaint.  Continues to have pain in the surgical site.  No nausea no vomiting.  Had a question about dressing.  Physical Exam:  General: Appear in mild distress, no Rash; Oral Mucosa Clear, moist. no Abnormal Neck Mass Or lumps, Conjunctiva normal  Cardiovascular: S1 and S2 Present, no Murmur, Respiratory: good respiratory effort, Bilateral Air entry present and CTA, no Crackles, no wheezes Abdomen: Bowel Sound present, Soft and no tenderness Extremities: no Pedal edema Neurology: alert and oriented to time, place, and person affect appropriate. no new focal deficit Gait not checked due to patient safety concerns  Vitals:   06/14/20 0506 06/14/20 0841 06/14/20 1135 06/14/20 1803  BP: (!) 144/97 128/73 124/68 115/73  Pulse: 90 96 93 60  Resp: '20 16 20 12  '$ Temp: 98.5 F (36.9 C) 98.6 F (37 C) 99.3 F (37.4 C) 98.6 F (37 C)  TempSrc: Oral Oral Oral Oral  SpO2: 100% 100% 99% 100%  Weight:        Intake/Output Summary (Last 24 hours) at 06/14/2020 1921 Last data filed at 06/14/2020 1300 Gross per 24 hour  Intake 480 ml  Output 601 ml  Net -121 ml   Filed Weights   06/06/20 1311  Weight: 43.7 kg    Data Reviewed: I have personally reviewed and interpreted daily labs, tele strips, imaging. I reviewed all nursing notes, pharmacy notes, vitals, pertinent old records I have discussed plan of care as described above with RN and  patient/family.  CBC: Recent Labs  Lab 06/08/20 0242 06/11/20 0221 06/12/20 0512 06/14/20 0449  WBC 5.4 5.3 7.1 6.8  NEUTROABS 3.4  --   --   --   HGB 11.9* 10.4* 10.4* 9.5*  HCT 37.3* 34.2* 32.7* 29.7*  MCV 90.3 93.7 91.1 90.8  PLT 266 267 265 [id number]   Basic Metabolic Panel: Recent Labs  Lab 06/08/20 0242 06/09/20 0100 06/11/20 0221 06/12/20 0048 06/12/20 0512 06/14/20 0449  NA 134* 135 136  --  137 135  K 3.9 3.8 3.6  --  4.1 4.0  CL 103 108 107  --  106 103  CO2 '23 23 22  '$ --  25 26  GLUCOSE 107* 106* 87  --  101* 99  BUN 26* 30* 17  --  12 17  CREATININE 0.83 1.12 0.82 0.88 0.85 1.11  CALCIUM 9.5 9.0 9.1  --  9.1 8.9  MG 2.1  --   --   --  2.1 2.0    Studies: No results found.  Scheduled Meds: . aspirin EC  81 mg Oral Q1400  . atorvastatin  40 mg Oral QHS  . clopidogrel  75 mg Oral Q breakfast  . docusate sodium  100 mg Oral BID  . enoxaparin (LOVENOX) injection  30 mg Subcutaneous Daily  . gabapentin  600 mg Oral TID  . lisinopril  20 mg Oral Daily  . multivitamin with minerals  1 tablet Oral Daily  . nicotine  7 mg Transdermal Daily  . sodium chloride flush  3 mL Intravenous Q12H   Continuous Infusions: . sodium chloride    . cefTRIAXone (ROCEPHIN)  IV 2 g (06/14/20 0751)  . metronidazole 500 mg (06/14/20 1243)   PRN Meds: sodium chloride, acetaminophen **OR** acetaminophen, magnesium hydroxide, morphine injection, ondansetron (ZOFRAN) IV, oxyCODONE-acetaminophen **AND** oxyCODONE, sodium chloride flush  Time spent: 35 minutes  Author: Berle Mull, MD [facility] 06/14/2020 7:21 PM  To reach On-call, see care teams to locate the attending and reach out via [email or web address]. Between 7PM-7AM, please contact night-coverage If you still have difficulty reaching the attending provider, please page the [facility] (Director on Call) for [facility] on amion for assistance.

## 2020-06-14 NOTE — Progress Notes (Signed)
Mobility Specialist: Progress Note   06/14/20 1321  Mobility  Activity Ambulated in hall  Level of Assistance Contact guard assist, steadying assist  Assistive Device Front wheel walker  Distance Ambulated (ft) 80 ft  Mobility Ambulated with assistance in hallway  Mobility Response Tolerated well  Mobility performed by Mobility specialist  $Mobility charge 1 Mobility   Pre-Mobility: 96 HR, 99% SpO2 Post-Mobility: 96 HR, 146/80 BP, 100% SpO2  Pt c/o 8/10 pain in LLE during ambulation, otherwise asx. Pt educated on bearing weight through his heel while walking and utilization of darco shoe. Pt back to bed after walk with call bell at his side.   [facility] [place]na Deroy Mobility Specialist Mobility Specialist Phone: [phone]

## 2020-06-15 DIAGNOSIS — M869 Osteomyelitis, unspecified: Secondary | ICD-10-CM | POA: Diagnosis not present

## 2020-06-15 LAB — CREATININE, SERUM
Creatinine, Ser: 0.89 mg/dL (ref 0.61–1.24)
GFR, Estimated: >60 mL/min (ref >60)

## 2020-06-15 MED ORDER — METRONIDAZOLE 500 MG PO TABS
500.0000 mg | ORAL_TABLET | Freq: Two times a day (BID) | ORAL | Status: DC
  Administered 2020-06-15 – 2020-06-16 (×2): 500 mg via ORAL
  Filled 2020-06-15 (×2): qty 1

## 2020-06-15 MED ORDER — SULFAMETHOXAZOLE-TRIMETHOPRIM 800-160 MG PO TABS
1.0000 | ORAL_TABLET | Freq: Two times a day (BID) | ORAL | Status: DC
  Administered 2020-06-15 – 2020-06-16 (×3): 1 via ORAL
  Filled 2020-06-15 (×2): qty 1

## 2020-06-15 NOTE — Progress Notes (Signed)
[facility] Progress Note  Patient: [name].    [id number]  DOA: 06/06/2020     Date of Service: the patient was seen and examined on 06/15/2020  Brief hospital course: Past medical history of CAD, HTN, PAD.  Presents with complaints of left leg pain found to have acute osteomyelitis of the left fifth toe.  Vascular surgery was consulted. Underwent angiography and stent placement. Now podiatry planning amputation of fifth toe. Currently plan is monitor for stability overnight, will discharge home tomorrow.  Assessment and Plan: Osteomyelitis of fifth toe of left foot Enterobacter cloacae infection Confirmed on MRI. Patient started empirically on vancomycin/cefepime/metronidazole IV. Blood cultures obtained and no growth to date.  Patient initially evaluated by podiatry who recommended vascular surgery consult.  -Vascular surgery took the patient with aortogram.  SP left common iliac artery and SFA stent. Patient was treated with IV vancomycin, cefepime and Flagyl. Blood cultures so far negative. Wound culture growing Enterobacter mostly sensitive.  Currently on Bactrim which we will continue for total of 2 weeks postop. Underwent amputation of the fifth toe.  Pain well controlled.  Chronic right foot wound Located on right dorsum of right foot. Wound care consulted with recommendations: Add enzymatic debridement ointment and saline moist gauze. Top with dry dressing. Change daily. Home health nurse on discharge.  Hyperlipidemia -Continue Lipitor  Primary hypertension -Continue lisinopril  Severe PAD -Continue aspirin/Plavix  Tobacco use Counseled on cessation.  Chronic pain/neuropathy -Continue gabapentin  Severe malnutrition Dietitian consulted with recommendations: Continue supplements.  Body mass index is 18.21 kg/m.  Nutrition Problem: Severe Malnutrition Etiology: chronic illness Interventions: Interventions: Ensure Enlive (each  supplement provides 350kcal and 20 grams of protein),Magic cup,MVI,Prostat,Juven  Diet: Regular diet DVT Prophylaxis:   enoxaparin (LOVENOX) injection 30 mg Start: 06/06/20 1115   Advance goals of care discussion: Full code  Family Communication: no family was present at bedside, at the time of interview.   Disposition:  Status is: Inpatient  Remains inpatient appropriate because:Ongoing diagnostic testing needed not appropriate for outpatient work up  Dispo: The patient is from: Home              Anticipated d/c is to: SNF              Patient currently is not medically stable to d/c.   Difficult to place patient No  Subjective: Pain well controlled.  No nausea no vomiting.  No fever no chills.  No diarrhea.  Physical Exam:  General: Appear in mild distress, no Rash; Oral Mucosa Clear, moist. no Abnormal Neck Mass Or lumps, Conjunctiva normal  Cardiovascular: S1 and S2 Present, no Murmur, Respiratory: good respiratory effort, Bilateral Air entry present and CTA, no Crackles, no wheezes Abdomen: Bowel Sound present, Soft and no tenderness Extremities: no Pedal edema Neurology: alert and oriented to time, place, and person affect appropriate. no new focal deficit Gait not checked due to patient safety concerns   Vitals:   06/15/20 1139 06/15/20 1227 06/15/20 1254 06/15/20 1719  BP:  (!) 132/99  118/73  Pulse: 93 93 97 80  Resp:  13  18  Temp:  98.6 F (37 C)  98.5 F (36.9 C)  TempSrc:  Oral  Oral  SpO2:  100% 100% 100%  Weight:        Intake/Output Summary (Last 24 hours) at 06/15/2020 1938 Last data filed at 06/15/2020 0844 Gross per 24 hour  Intake 360 ml  Output --  Net 360 ml   Danley Danker  Weights   06/06/20 1311  Weight: 43.7 kg    Data Reviewed: I have personally reviewed and interpreted daily labs, tele strips, imaging. I reviewed all nursing notes, pharmacy notes, vitals, pertinent old records I have discussed plan of care as described above with RN and  patient/family.  CBC: Recent Labs  Lab 06/11/20 0221 06/12/20 0512 06/14/20 0449  WBC 5.3 7.1 6.8  HGB 10.4* 10.4* 9.5*  HCT 34.2* 32.7* 29.7*  MCV 93.7 91.1 90.8  PLT 267 265 [id number]   Basic Metabolic Panel: Recent Labs  Lab 06/09/20 0100 06/11/20 0221 06/12/20 0048 06/12/20 0512 06/14/20 0449 06/15/20 0041  NA 135 136  --  137 135  --   K 3.8 3.6  --  4.1 4.0  --   CL 108 107  --  106 103  --   CO2 23 22  --  25 26  --   GLUCOSE 106* 87  --  101* 99  --   BUN 30* 17  --  12 17  --   CREATININE 1.12 0.82 0.88 0.85 1.11 0.89  CALCIUM 9.0 9.1  --  9.1 8.9  --   MG  --   --   --  2.1 2.0  --     Studies: No results found.  Scheduled Meds: . aspirin EC  81 mg Oral Q1400  . atorvastatin  40 mg Oral QHS  . clopidogrel  75 mg Oral Q breakfast  . docusate sodium  100 mg Oral BID  . enoxaparin (LOVENOX) injection  30 mg Subcutaneous Daily  . gabapentin  600 mg Oral TID  . lisinopril  20 mg Oral Daily  . metroNIDAZOLE  500 mg Oral Q12H  . multivitamin with minerals  1 tablet Oral Daily  . nicotine  7 mg Transdermal Daily  . sodium chloride flush  3 mL Intravenous Q12H  . sulfamethoxazole-trimethoprim  1 tablet Oral Q12H   Continuous Infusions: . sodium chloride     PRN Meds: sodium chloride, acetaminophen **OR** acetaminophen, magnesium hydroxide, morphine injection, ondansetron (ZOFRAN) IV, oxyCODONE-acetaminophen **AND** oxyCODONE, sodium chloride flush  Time spent: 35 minutes  Author: Berle Mull, MD [facility] 06/15/2020 7:38 PM  To reach On-call, see care teams to locate the attending and reach out via [email or web address]. Between 7PM-7AM, please contact night-coverage If you still have difficulty reaching the attending provider, please page the [facility] (Director on Call) for [facility] on amion for assistance.

## 2020-06-15 NOTE — Evaluation (Signed)
Physical Therapy Evaluation Patient Details Name: [name]. MRN: [id number] DOB: 01/26/1961 Today's Date: 06/15/2020   History of Present Illness  60 yo admitted 5/13 with left foot osteomyelitis s/p left 5th toe amputation 5/20. PMhx: CAD, HTN, PAD, chronic wound right foot  Clinical Impression  Pt very pleasant but reports pain in left foot. Pt educated for heel weight bearing and for new order to transition post op shoe to [place]. Pt with decreased ability with transfers, gait and functional mobility adhering to heel weight bearing and will benefit from acute therapy to maximize mobility, safety and function.   HR 97 SpO2 100% on RA    Follow Up Recommendations Home health PT    Equipment Recommendations  3in1 (PT);Rolling walker with 5" wheels (youth)   Recommendations for Other Services OT consult     Precautions / Restrictions Precautions Precautions: Fall Precaution Comments: initial order for darco entered as post op with MD aware and new order placed. Ok to use post op shoe until darco arrives Restrictions [employer] Bearing Restrictions: Yes RLE Weight Bearing: Partial weight bearing Other Position/Activity Restrictions: heel weight bearing      Mobility  Bed Mobility Overal bed mobility: Modified Independent                  Transfers Overall transfer level: Needs assistance   Transfers: Sit to/from Stand Sit to Stand: Min guard         General transfer comment: cues for hand placement and safety  Ambulation/Gait Ambulation/Gait assistance: Min guard Gait Distance (Feet): 65 Feet Assistive device: Rolling walker (2 wheeled) (youth RW) Gait Pattern/deviations: Step-to pattern;Trunk flexed   Gait velocity interpretation: <1.8 ft/sec, indicate of risk for recurrent falls General Gait Details: pt with weight on left heel but unable to tolerate significant weight even on heel and performing TDWB with decreased bil UE strength to push  through RW and successfully hop. Pt sliding right foot and required max cues for sequence and proximity to RW. limited by fatigue. Pt walked 10' then 29' after short RW obtained  Stairs            Wheelchair Mobility    Modified Rankin (Stroke Patients Only)       Balance Overall balance assessment: Needs assistance Sitting-balance support: No upper extremity supported;Feet supported Sitting balance-Leahy Scale: Fair     Standing balance support: Bilateral upper extremity supported Standing balance-Leahy Scale: Poor Standing balance comment: bil UE support on RW to off weight LLE and for balance                             Pertinent Vitals/Pain Pain Assessment: 0-10 Pain Score: 8  Pain Location: left foot Pain Descriptors / Indicators: Aching;Guarding Pain Intervention(s): Limited activity within patient's tolerance;Monitored during session;Premedicated before session;Repositioned    Home Living Family/patient expects to be discharged to:: Private residence Living Arrangements: Non-relatives/Friends Available Help at Discharge: Friend(s) Type of Home: Apartment       Home Layout: One level Home Equipment: [name] - single point Additional Comments: pt lives in an apartment with 7 steps to enter and brother lives in single story home with level entry    Prior Function Level of Independence: Independent               Hand Dominance        Extremity/Trunk Assessment   Upper Extremity Assessment Upper Extremity Assessment: Generalized weakness  Lower Extremity Assessment Lower Extremity Assessment: Generalized weakness    Cervical / Trunk Assessment Cervical / Trunk Assessment: Kyphotic  Communication   Communication: No difficulties  Cognition Arousal/Alertness: Awake/alert Behavior During Therapy: WFL for tasks assessed/performed Overall Cognitive Status: Impaired/Different from baseline Area of Impairment: Following  commands;Safety/judgement;Orientation                 Orientation Level: Time     Following Commands: Follows one step commands consistently Safety/Judgement: Decreased awareness of deficits;Decreased awareness of safety            General Comments      Exercises     Assessment/Plan    PT Assessment Patient needs continued PT services  PT Problem List Decreased strength;Decreased mobility;Decreased safety awareness;Decreased activity tolerance;Decreased balance;Decreased knowledge of use of DME;Pain       PT Treatment Interventions Gait training;Stair training;Functional mobility training;Therapeutic activities;Patient/family education;Therapeutic exercise;DME instruction;Balance training    PT Goals (Current goals can be found in the Care Plan section)  Acute Rehab PT Goals Patient Stated Goal: return home PT Goal Formulation: With patient Time For Goal Achievement: 06/29/20 Potential to Achieve Goals: Fair    Frequency Min 3X/week   Barriers to discharge Decreased caregiver support      Co-evaluation               AM-PAC PT "6 Clicks" Mobility  Outcome Measure Help needed turning from your back to your side while in a flat bed without using bedrails?: A Little Help needed moving from lying on your back to sitting on the side of a flat bed without using bedrails?: A Little Help needed moving to and from a bed to a chair (including a wheelchair)?: A Little Help needed standing up from a chair using your arms (e.g., wheelchair or bedside chair)?: A Little Help needed to walk in hospital room?: A Lot Help needed climbing 3-5 steps with a railing? : Total 6 Click Score: 15    End of Session Equipment Utilized During Treatment: Other (comment) (post op shoe) Activity Tolerance: Patient tolerated treatment well Patient left: in chair;with call bell/phone within reach Nurse Communication: Mobility status;Precautions;Weight bearing status PT Visit  Diagnosis: Other abnormalities of gait and mobility (R26.89);Difficulty in walking, not elsewhere classified (R26.2);Muscle weakness (generalized) (M62.81)    Time: [id number] PT Time Calculation (min) (ACUTE ONLY): 30 min   Charges:   PT Evaluation $PT Eval Moderate Complexity: 1 Mod PT Treatments $Gait Training: 8-22 mins        Krishawna Stiefel P, PT Acute Rehabilitation Services Pager: [phone] Office: [place] 06/15/2020, 1:03 PM

## 2020-06-15 NOTE — Progress Notes (Signed)
Orthopedic Tech Progress Note Patient Details:  [name]. 1960-02-20 [id number]  Ortho Devices Type of Ortho Device: Darco shoe Ortho Device/Splint Location: Left Lower Extremity Ortho Device/Splint Interventions: Ordered (Delivered to pt bedside)   Post Interventions Patient Tolerated: Well Instructions Provided: Care of device   Patsy Varma Allie Dimmer 06/15/2020, 1:27 PM

## 2020-06-15 NOTE — Progress Notes (Signed)
Mobility Specialist: Progress Note   06/15/20 1643  Mobility  Activity Ambulated in hall  Level of Assistance Minimal assist, patient does 75% or more  Assistive Device Front wheel walker  Distance Ambulated (ft) 210 ft  Mobility Ambulated with assistance in hallway  Mobility Response Tolerated well  Mobility performed by Mobility specialist  $Mobility charge 1 Mobility   Pre-Mobility: 85 HR, 99% SpO2 Post-Mobility: 100 HR, 100% SpO2  Pt was minA to stand from chair and standby assist during ambulation. Pt ambulated using darco shoe, demonstrated good understanding of proper gait during session. Pt c/o 6/10 pain in his L foot, otherwise asx. Pt to bed after walk per request.   Harrell Gave Edenilson Austad Mobility Specialist Mobility Specialist Phone: [phone]

## 2020-06-16 ENCOUNTER — Other Ambulatory Visit (HOSPITAL_COMMUNITY): Payer: Self-pay

## 2020-06-16 DIAGNOSIS — M869 Osteomyelitis, unspecified: Secondary | ICD-10-CM | POA: Diagnosis not present

## 2020-06-16 LAB — CBC
HCT: 31.7 % — ABNORMAL LOW (ref 39.0–52.0)
Hemoglobin: 9.9 g/dL — ABNORMAL LOW (ref 13.0–17.0)
MCH: 28.4 pg (ref 26.0–34.0)
MCHC: 31.2 g/dL (ref 30.0–36.0)
MCV: 91.1 fL (ref 80.0–100.0)
Platelets: 363 10*3/uL (ref 150–400)
RBC: 3.48 MIL/uL — ABNORMAL LOW (ref 4.22–5.81)
RDW: 16.7 % — ABNORMAL HIGH (ref 11.5–15.5)
WBC: 6.3 10*3/uL (ref 4.0–10.5)
nRBC: 0 % (ref 0.0–0.2)

## 2020-06-16 LAB — BASIC METABOLIC PANEL
Anion gap: 5 (ref 5–15)
BUN: 16 mg/dL (ref 6–20)
CO2: 27 mmol/L (ref 22–32)
Calcium: 9 mg/dL (ref 8.9–10.3)
Chloride: 104 mmol/L (ref 98–111)
Creatinine, Ser: 0.87 mg/dL (ref 0.61–1.24)
GFR, Estimated: >60 mL/min (ref >60)
Glucose, Bld: 94 mg/dL (ref 70–99)
Potassium: 4.2 mmol/L (ref 3.5–5.1)
Sodium: 136 mmol/L (ref 135–145)

## 2020-06-16 LAB — MAGNESIUM: Magnesium: 2.1 mg/dL (ref 1.7–2.4)

## 2020-06-16 LAB — SURGICAL PATHOLOGY

## 2020-06-16 MED ORDER — GABAPENTIN 600 MG PO TABS: 1200.0000 mg | ORAL_TABLET | Freq: Every day | ORAL | Status: DC

## 2020-06-16 MED ORDER — GABAPENTIN 600 MG PO TABS
ORAL_TABLET | ORAL | 0 refills | Status: AC
  Filled 2020-06-16: qty 120, 30d supply, fill #0

## 2020-06-16 MED ORDER — SULFAMETHOXAZOLE-TRIMETHOPRIM 800-160 MG PO TABS
1.0000 | ORAL_TABLET | Freq: Two times a day (BID) | ORAL | 0 refills | Status: AC
  Filled 2020-06-16: qty 26, 13d supply, fill #0

## 2020-06-16 MED ORDER — GABAPENTIN 600 MG PO TABS: 600.0000 mg | ORAL_TABLET | Freq: Two times a day (BID) | ORAL | Status: DC

## 2020-06-16 MED ORDER — NICOTINE 7 MG/24HR TD PT24
7.0000 mg | MEDICATED_PATCH | Freq: Every day | TRANSDERMAL | 0 refills | Status: DC
  Filled 2020-06-16: qty 28, 28d supply, fill #0

## 2020-06-16 MED ORDER — DOCUSATE SODIUM 100 MG PO CAPS
100.0000 mg | ORAL_CAPSULE | Freq: Two times a day (BID) | ORAL | 0 refills | Status: DC
  Filled 2020-06-16: qty 10, 5d supply, fill #0

## 2020-06-16 MED ORDER — GABAPENTIN 800 MG PO TABS: 800.0000 mg | ORAL_TABLET | Freq: Three times a day (TID) | ORAL | Status: DC

## 2020-06-16 MED ORDER — OXYCODONE-ACETAMINOPHEN 5-325 MG PO TABS
1.0000 | ORAL_TABLET | Freq: Four times a day (QID) | ORAL | 0 refills | Status: DC | PRN
  Filled 2020-06-16: qty 30, 4d supply, fill #0

## 2020-06-16 NOTE — TOC Transition Note (Addendum)
Transition of Care (TOC) - CM/SW Discharge Note Marvetta Gibbons RN, BSN Transitions of Care Unit 4E- RN Case Manager See Treatment Team for direct phone #    Patient Details  Name: [name]. MRN: [id number] Date of Birth: 01-02-1961  Transition of Care [facility]) CM/SW Contact:  Dawayne Patricia, RN Phone Number: 06/16/2020, 3:47 PM   Clinical Narrative:    Pt stable for transition home today, CM spoke with pt at bedside for transition of care needs. Per pt he will need assistance with transportation home. Will assist with [facility] door to door service.   List provided for [facility] choice Per CMS guidelines from medicare.gov website with star ratings (copy placed in shadow chart), pt would like to use [facility] or [facility] as first choices, after that no preference. Pt also reports that he will need RW and 3n1- Orders have been placed for both DME and HH needs.  Address, Phone # and PCP all confirmed with pt, he also reports that he uses [facility] for appointments.   Call made to [facility] for DME needs- RW and 3n1 to be delivered to room prior to discharge.   Call made to [facility]- per Lattie Haw they are unable to accept referral at this time,  Call made to [facility]- per Ramond Marrow they are unable to accept at this time. Call made to [facility]- they are able to accept with a delay in start of care for end of this week- due to nursing staffing- will accept and plan to see pt by the end of week. Bedside RN to educate pt on wound care needs.   Unit to call for transport once DME has been delivered.    Final next level of care: [place] Barriers to Discharge: Continued Medical Work up   Patient Goals and CMS Choice Patient states their goals for this hospitalization and ongoing recovery are:: return home and get better CMS Medicare.gov Compare Post Acute Care list provided to:: Patient Choice offered to / list presented to : Patient  Discharge Placement                Home with [facility]        Discharge Plan and Services   Discharge Planning Services: CM Consult Post Acute Care Choice: Home Health,Durable Medical Equipment          DME Arranged: 3-N-1,Walker rolling DME Agency: [facility] Date DME Agency Contacted: 06/16/20 Time DME Agency Contacted: [id number] Representative spoke with at DME Agency: Sheila Woodbine: RN,PT,OT [place] Agency: [place] Date [place]: 06/16/20 Time [place]: [id number] Representative spoke with at [place]: [place]) Interventions     Readmission Risk Interventions Readmission Risk Prevention Plan 06/16/2020  Post Dischage Appt Complete  Medication Screening Complete  Transportation Screening Complete  Some recent data might be hidden

## 2020-06-16 NOTE — Progress Notes (Signed)
Mobility Specialist: Progress Note   06/16/20 1120  Mobility  Activity Ambulated in hall  Level of Assistance Minimal assist, patient does 75% or more  Assistive Device Front wheel walker  Distance Ambulated (ft) 240 ft  Mobility Ambulated with assistance in hallway  Mobility Response Tolerated well  Mobility performed by Mobility specialist  $Mobility charge 1 Mobility   Pre-Mobility: 83 HR, 100% SpO2 During Mobility: 117 HR Post-Mobility: 93 HR, 129/67 BP, 100% SpO2  Pt was minA to stand from EOB and contact guard during ambulation. Pt ambulated using darco shoe, demonstrating knowledge of proper gait with shoe. Pt did require frequent cues for RW proximity as well as to slow pace. C/o 8/10 pain in L foot, otherwise asx. Pt back to bed after walk per request.   [name] [name] Mobility Specialist Mobility Specialist Phone: [phone]

## 2020-06-16 NOTE — Evaluation (Signed)
Occupational Therapy Evaluation Patient Details Name: [name]. MRN: [id number] DOB: 05-07-1960 Today's Date: 06/16/2020    History of Present Illness 60 yo admitted 5/13 with left foot osteomyelitis s/p left 5th toe amputation 5/20. PMhx: CAD, HTN, PAD, chronic wound right foot   Clinical Impression   PTA patient independent with ADLs, mobility.  Admitted for above and presenting with problem list below, including pain in L LE, impaired balance, generalized weakness, decreased activity tolerance.  Pt with good recall of darco shoe to L LE, heel weight bearing but requires cueing for safety with transfers, RW mgmt throughout session.  Patient requires min assist for transfers and mobility using RW, up to mod assist for LB ADLs.  Believe he will benefit from further OT services while admitted and after dc at  [facility] level to optimize independence and safety for return back to PLOF with ADLs, mobility.  Will follow acutely.      Follow Up Recommendations  Home health OT;Supervision/Assistance - 24 hour (inital)    Equipment Recommendations  3 in 1 bedside commode;Other (comment) (RW)    Recommendations for Other Services       Precautions / Restrictions Precautions Precautions: Fall Precaution Comments: darco L LE Restrictions Weight Bearing Restrictions: Yes LLE Weight Bearing: Weight bearing as tolerated Other Position/Activity Restrictions: heel weight bearing      Mobility Bed Mobility Overal bed mobility: Modified Independent                  Transfers Overall transfer level: Needs assistance Equipment used: Rolling walker (2 wheeled) Transfers: Sit to/from Stand Sit to Stand: Min assist         General transfer comment: cueing for hand placement and safety, min assist to steady    Balance Overall balance assessment: Needs assistance Sitting-balance support: No upper extremity supported;Feet supported Sitting balance-Leahy Scale: Fair      Standing balance support: Bilateral upper extremity supported;During functional activity Standing balance-Leahy Scale: Poor Standing balance comment: relies on BUE support                           ADL either performed or assessed with clinical judgement   ADL Overall ADL's : Needs assistance/impaired     Grooming: Set up;Sitting   Upper Body Bathing: Set up;Sitting   Lower Body Bathing: Sit to/from stand;Moderate assistance   Upper Body Dressing : Set up;Sitting   Lower Body Dressing: Moderate assistance;Sit to/from stand Lower Body Dressing Details (indicate cue type and reason): requires assist for L LE, relies on BUE support standing Toilet Transfer: Minimal assistance;Ambulation;RW Toilet Transfer Details (indicate cue type and reason): simulated to chair at sink         Functional mobility during ADLs: Minimal assistance;Rolling walker;Cueing for safety;Cueing for sequencing General ADL Comments: pt limited by pain, balance, decreased activity tolerance; cueing for safety with RW mgmt     Vision   Vision Assessment?: No apparent visual deficits     Perception     Praxis      Pertinent Vitals/Pain Pain Assessment: 0-10 Pain Score: 8  Pain Location: left foot Pain Descriptors / Indicators: Guarding;Throbbing Pain Intervention(s): Limited activity within patient's tolerance;Monitored during session;Repositioned     Hand Dominance Right   Extremity/Trunk Assessment Upper Extremity Assessment Upper Extremity Assessment: Generalized weakness   Lower Extremity Assessment Lower Extremity Assessment: Defer to PT evaluation   Cervical / Trunk Assessment Cervical / Trunk Assessment: Kyphotic   Communication  Communication Communication: No difficulties   Cognition Arousal/Alertness: Awake/alert Behavior During Therapy: WFL for tasks assessed/performed Overall Cognitive Status: Impaired/Different from baseline Area of Impairment: Following  commands;Safety/judgement;Awareness;Problem solving                       Following Commands: Follows one step commands consistently;Follows multi-step commands inconsistently;Follows multi-step commands with increased time Safety/Judgement: Decreased awareness of safety;Decreased awareness of deficits Awareness: Emergent Problem Solving: Slow processing;Difficulty sequencing;Requires verbal cues General Comments: pt requires increased time to sequence and follow mulitple step commands, decreased awareness to safety but good recall of L LE heel weightbearing with darco shoe   General Comments  VSS    Exercises     Shoulder Instructions      Home Living Family/patient expects to be discharged to:: Private residence Living Arrangements: Non-relatives/Friends Available Help at Discharge: Family (maybe brothers assist) Type of Home: Apartment Home Access: Level entry     Home Layout: Two level Alternate Level Stairs-Number of Steps: 10 Alternate Level Stairs-Rails: Right Bathroom Shower/Tub: Teacher, early years/pre: Standard     Home Equipment: Cane - single point   Additional Comments: pt lives in an apartment and brother lives in single story home with level entry one level      Prior Functioning/Environment Level of Independence: Independent        Comments: call transportation for drs appt from [facility]; friends assist to grocery store as pt does not drive        OT Problem List: Decreased strength;Decreased activity tolerance;Impaired balance (sitting and/or standing);Decreased safety awareness;Decreased knowledge of use of DME or AE;Decreased knowledge of precautions;Pain      OT Treatment/Interventions: Self-care/ADL training;DME and/or AE instruction;Therapeutic activities;Patient/family education;Balance training;Therapeutic exercise;Energy conservation    OT Goals(Current goals can be found in the care plan section) Acute Rehab OT  Goals Patient Stated Goal: return home OT Goal Formulation: With patient Time For Goal Achievement: 06/30/20 Potential to Achieve Goals: Good  OT Frequency: Min 2X/week   Barriers to D/C:            Co-evaluation              AM-PAC OT "6 Clicks" Daily Activity     Outcome Measure Help from another person eating meals?: None Help from another person taking care of personal grooming?: A Little Help from another person toileting, which includes using toliet, bedpan, or urinal?: A Lot Help from another person bathing (including washing, rinsing, drying)?: A Lot Help from another person to put on and taking off regular upper body clothing?: A Little Help from another person to put on and taking off regular lower body clothing?: A Lot 6 Click Score: 16   End of Session Equipment Utilized During Treatment: Gait belt;Rolling walker Nurse Communication: Mobility status  Activity Tolerance: Patient tolerated treatment well Patient left: in bed;with call bell/phone within reach;with bed alarm set  OT Visit Diagnosis: Other abnormalities of gait and mobility (R26.89);Muscle weakness (generalized) (M62.81);Pain Pain - Right/Left: Left Pain - part of body: Ankle and joints of foot                Time: [id number] OT Time Calculation (min): 26 min Charges:  OT General Charges $OT Visit: 1 Visit OT Evaluation $OT Eval Moderate Complexity: 1 Mod OT Treatments $Self Care/Home Management : 8-22 mins  Jolaine Artist, OT Acute Rehabilitation Services Pager [phone] Office [phone]   Delight Stare 06/16/2020, 9:23 AM

## 2020-06-18 LAB — AEROBIC/ANAEROBIC CULTURE W GRAM STAIN (SURGICAL/DEEP WOUND)

## 2020-06-18 NOTE — Discharge Summary (Signed)
[facility] Discharge Summary   Patient: [name]. DPO:[id number]  PCP: Ruben Mile, NP  Date of admission: 06/06/2020   Date of discharge: 06/16/2020     Discharge Diagnoses:  Principal Problem:   Osteomyelitis of fifth toe of left foot ([place]) Active Problems:   Essential hypertension, benign   Type 2 diabetes mellitus without complication, without long-term current use of insulin (HCC)   PVD (peripheral vascular disease) (HCC)   Toe infection   Protein-calorie malnutrition, severe   Tobacco use  Admitted From: Home Disposition:  Home   Recommendations for Outpatient Follow-up:  1. PCP: Follow-up with PCP as well as podiatry and vascular surgery as recommended   Follow-up Information    Ruben Mile, NP. Schedule an appointment as soon as possible for a visit in 1 week(s).   Contact information: [place] [address] [phone]        [name], DPM. Schedule an appointment as soon as possible for a visit in 2 week(s).   Specialty: Podiatry Contact information: [facility] [place] [address] [phone]        [facility]. Schedule an appointment as soon as possible for a visit in 1 month(s).   Contact information: [address] [facility] Follow up.   Why: rolling walker and 3n1 arranged- to be delivered to room prior to discharge Contact information: [place] [address] [phone]        Care, [facility] Follow up.   Specialty: [place] Why: HHRN/PT/OT arranged- they will try to do start of care by the end of the week (delay in [facility] staffing)- they will call you to set up visit Contact information: [place]  [address] [phone]              Discharge Instructions    Diet - low sodium heart healthy   Complete by: As directed    Discharge  instructions   Complete by: As directed    It is important that you read the instructions as well as go over your medication list with RN to help you understand your care after this hospitalization.  Please follow-up with PCP in 1-2 weeks.  Please note that we are unable to authorize any refills for discharge medications, once you are discharged. Thus, it is imperative that you return to your primary care physician (or establish a relationship with a primary care physician if you do not have one) for your care needs. So that they can reassess your need for medications and monitor your lab values.  Please request your primary care physician to go over all Hospital Tests and Procedure/Radiological results at the follow up. Please get all Hospital records sent to your PCP by signing hospital release before you go home.   Do not drive, operating heavy machinery, perform activities at heights, swimming or participation in water activities or provide baby sitting services while you are on Pain, Sleep and Anxiety Medications; until you have been seen by Primary Care Physician and are cleared to do such activities.  Do not take more than prescribed Pain, Sleep and Anxiety Medications.  You were cared for by a hospitalist during your hospital stay. If you have any questions about your discharge medications or the care you received while you were in the hospital after you are discharged, you can call  the hospital unit/floor you were admitted to and ask to speak with the hospitalist who took care of you. Ask for Hospitalist on call, if the hospitalist that took care of you is not available. Once you are discharged, your primary care physician will help you with any further medical issues. You Must read complete instructions/literature along with all the possible adverse reactions/side effects for all the Medicines you take and that have been prescribed to you. Take any new Medicines after you have completely  understood and accept all the possible adverse reactions/side effects. If you have smoked or chewed Tobacco, please STOP smoking. If you drink alcohol, please safely STOP the use. Do not drive, operating heavy machinery, perform activities at heights, swimming or participation in water activities or provide baby sitting services under influence.   Discharge wound care:   Complete by: As directed    Cleanse right foot wound with saline, pat dry.  Apply 1/4" thick layer of Santyl to the foot wound; cover with 2x2 moist with saline, top with dry dressing. Secure with kerlix or conform gauze. Change daily.   Increase activity slowly   Complete by: As directed      Diet recommendation: Cardiac diet  Activity: The patient is advised to gradually reintroduce usual activities, as tolerated  Discharge Condition: stable  Code Status: Full code   History of present illness: As per the H and P dictated on admission, "[name]. is a 60 y.o. male with medical history significant of CAD, HTN, PAD. Presenting with left foot pain. Reports he's had pain in the left pinky toe for 3 months. He has been following with wound care for a wound in his right foot. He says that over the last several days, the pain in his left pinky toe has worsened. It's sharp and constant. He spoke with his PCP about it a couple days ago. He was sent for imaging of the foot, but can not tell me where. He states based on that imaging result, he was referred to the ED for evaluation. He denies any other aggravating or alleviating factors.    ED Course: He was started con vanc/zosyn. Foot XR did not show osteo. MRI foot ordered. [facility] called for admission.  "  Hospital Course:  Summary of his active problems in the hospital is as following. Osteomyelitis of fifth toe of left foot Enterobacter cloacae infection Confirmed on MRI. Patient started empirically on vancomycin/cefepime/metronidazole IV. Blood cultures obtained and  no growth to date.  Patient initially evaluated by podiatry who recommended vascular surgery consult.  -Vascular surgery took the patient with aortogram.  SP left common iliac artery and SFA stent. Patient was treated with IV vancomycin, cefepime and Flagyl. Blood cultures so far negative. Wound culture growing Enterobacter mostly sensitive.  Currently on Bactrim which we will continue for total of 2 weeks postop. Underwent amputation of the fifth toe.  Pain well controlled.  Chronic right foot wound Located on right dorsum of right foot. Wound care consulted with recommendations: Add enzymatic debridement ointment and saline moist gauze. Top with dry dressing. Change daily. Home health nurse on discharge.  Hyperlipidemia -Continue Lipitor  Primary hypertension -Continue lisinopril  Severe PAD -Continue aspirin/Plavix  Tobacco use Counseled on cessation.  Chronic pain/neuropathy -Continue gabapentin  Severe malnutrition Dietitian consulted with recommendations: Continue supplements.  Body mass index is 18.21 kg/m.  Nutrition Problem: Severe Malnutrition Etiology: chronic illness Nutrition Interventions: Interventions: Ensure Enlive (each supplement provides 350kcal and 20 grams of  protein),Magic cup,MVI,Prostat,Juven      Pain control  - [employer] Controlled Substance Reporting System database was reviewed. - 5 day supply was provided. - Patient was instructed, not to drive, operate heavy machinery, perform activities at heights, swimming or participation in water activities or provide baby sitting services while on Pain, Sleep and Anxiety Medications; until his outpatient Physician has advised to do so again.  - Also recommended to not to take more than prescribed Pain, Sleep and Anxiety Medications.  Patient was seen by physical therapy, who recommended Home Health,. On the day of the discharge the patient's vitals were stable, and no other new acute  medical condition were reported. The patient was felt safe to be discharge at Home with Home health.  Consultants: Vascular surgery, podiatry Procedures: Left common iliac artery and SFA stenting. Amputation of the left fifth toe  DISCHARGE MEDICATION: Allergies as of 06/16/2020   No Known Allergies     Medication List    STOP taking these medications   ondansetron 4 MG tablet Commonly known as: ZOFRAN   traMADol 50 MG tablet Commonly known as: ULTRAM     TAKE these medications   aspirin 81 MG EC tablet Take 1 tablet (81 mg total) by mouth daily. Swallow whole. What changed: when to take this   atorvastatin 40 MG tablet Commonly known as: LIPITOR Take 1 tablet by mouth at bedtime. What changed: Another medication with the same name was removed. Continue taking this medication, and follow the directions you see here.   Blood Pressure Monitor Kit Check blood pressure daily What changed:   how much to take  how to take this  when to take this  additional instructions   clopidogrel 75 MG tablet Commonly known as: PLAVIX Take 1 tablet (75 mg total) by mouth daily with breakfast.   docusate sodium 100 MG capsule Commonly known as: COLACE Take 1 capsule (100 mg total) by mouth 2 (two) times daily.   gabapentin 600 MG tablet Commonly known as: NEURONTIN Take 1 tablet (600 mg) in morning and afternoon. Take 2 tablets (1200 mg) at night What changed:   how much to take  how to take this  when to take this  additional instructions   lisinopril-hydrochlorothiazide 20-25 MG tablet Commonly known as: ZESTORETIC Take 1 tablet by mouth daily.   nicotine 7 mg/24hr patch Commonly known as: NICODERM CQ - dosed in mg/24 hr Place 1 patch (7 mg total) onto the skin daily.   oxyCODONE-acetaminophen 5-325 MG tablet Commonly known as: PERCOCET/ROXICET Take 1-2 tablets by mouth every 6 (six) hours as needed for moderate pain.   Santyl ointment Generic drug:  collagenase Apply 1 application topically daily as needed (wound care).   SSD 1 % cream Generic drug: silver sulfADIAZINE Apply 1 application topically daily as needed (wound care).   sulfamethoxazole-trimethoprim 800-160 MG tablet Commonly known as: BACTRIM DS Take 1 tablet by mouth 2 (two) times daily for 13 days.            Discharge Care Instructions  (From admission, onward)         Start     Ordered   06/16/20 0000  Discharge wound care:       Comments: Cleanse right foot wound with saline, pat dry.  Apply 1/4" thick layer of Santyl to the foot wound; cover with 2x2 moist with saline, top with dry dressing. Secure with kerlix or conform gauze. Change daily.   06/16/20 1449  Discharge Exam: Filed Weights   06/06/20 1311  Weight: 43.7 kg   Vitals:   06/16/20 0308 06/16/20 0823  BP: 120/73 129/68  Pulse: 69 95  Resp: 17 16  Temp: 98.6 F (37 C) 98.3 F (36.8 C)  SpO2: 100% 99%   General: Appear in mild distress, no Rash; Oral Mucosa Clear, moist. no Abnormal Neck Mass Or lumps, Conjunctiva normal  Cardiovascular: S1 and S2 Present, no Murmur, Respiratory: good respiratory effort, Bilateral Air entry present and CTA, no Crackles, no wheezes Abdomen: Bowel Sound present, Soft and no tenderness Extremities: no Pedal edema Neurology: alert and oriented to time, place, and person affect appropriate. no new focal deficit Gait not checked due to patient safety concerns    The results of significant diagnostics from this hospitalization (including imaging, microbiology, ancillary and laboratory) are listed below for reference.    Significant Diagnostic Studies: MR FOOT LEFT WO CONTRAST  Result Date: 06/06/2020 CLINICAL DATA:  Pain and swelling of the fifth toe. EXAM: MRI OF THE LEFT FOOT WITHOUT CONTRAST TECHNIQUE: Multiplanar, multisequence MR imaging of the left foot was performed. No intravenous contrast was administered. COMPARISON:  Radiographs  06/06/2020 FINDINGS: Examination is quite limited due to patient motion and poor distal fat saturation. However, there is abnormal T1 and T2 signal intensity in the fused middle and distal phalanges of the fifth toe highly suspicious for osteomyelitis. There is also surrounding soft tissue swelling/edema and fluid in the subcutaneous tissues suspicious for cellulitis and small abscess. I do not see any other definite sites of osteomyelitis and no findings suspicious for septic arthritis. No findings to suggest myofasciitis or pyomyositis. The major tendons and ligaments appear intact. IMPRESSION: 1. Findings highly suspicious for osteomyelitis involving the fused middle and distal phalanges of the fifth toe. 2. Cellulitis and suspected small subcutaneous abscess involving the fifth toe. 3. No findings for myofasciitis or pyomyositis. Electronically Signed   By: Marijo Sanes M.D.   On: 06/06/2020 13:19   PERIPHERAL VASCULAR CATHETERIZATION  Result Date: 06/10/2020 Patient name: Saron Tweed. MRN: [id number] DOB: Sep 20, 1960 Sex: male 06/10/2020 Pre-operative Diagnosis: Left toe ulcer Post-operative diagnosis:  Same Surgeon:  Annamarie Major Procedure Performed:  1.  Ultrasound-guided access, right common femoral artery  2.  Ultrasound-guided access, left anterior tibial artery  3.  Abdominal aortogram  4.  Left lower extremity runoff  5.  Stent, left superficial femoral and popliteal artery  6.  Stent, left common iliac artery  7.  Conscious sedation, 160 minutes  8.  Closure device, Celt Indications: This is a 60 year old gentleman for whom vascular surgery consultation was requested for intervention of the left leg for ulcer.  Details of the procedure were discussed with the patient and he wished to proceed Procedure:  The patient was identified in the holding area and taken to room 8.  The patient was then placed supine on the table and prepped and draped in the usual sterile fashion.  A time out was  called.  Conscious sedation was administered with the use of IV fentanyl and Versed under continuous physician and nurse monitoring.  Heart rate, blood pressure, and oxygen saturation were continuously monitored.  Total sedation time was 106 minutes.  Ultrasound was used to evaluate the right common femoral artery.  It was patent .  A digital ultrasound image was acquired.  A micropuncture needle was used to access the right common femoral artery under ultrasound guidance.  An 018 wire was advanced without resistance and a  micropuncture sheath was placed.  The 018 wire was removed and a benson wire was placed.  The micropuncture sheath was exchanged for a 5 french sheath.  An omniflush catheter was advanced over the wire to the level of L-1.  An abdominal angiogram was obtained.  Next, using the omniflush catheter and a benson wire, the aortic bifurcation was crossed and the catheter was placed into theleft external iliac artery and left runoff was obtained.  Findings:  Aortogram: No significant renal artery stenosis.  The right iliac system is widely patent as well as its associated stent.  There is a 60 to 70% stenosis within the left distal common iliac artery.  Extrailiac artery appears patent throughout its course.  Right Lower Extremity: Not evaluated  Left Lower Extremity: Left common femoral and profundofemoral artery are patent throughout the course.  Superficial femoral artery is occluded with reconstitution at the adductor canal.  The above-knee popliteal artery is very small in caliber however remains patent as does the below-knee popliteal artery.  Dominant runoff is the anterior tibial artery Intervention: After the above images were acquired the decision made to proceed with intervention.  A 6 French 45 cm sheath was advanced over the aortic bifurcation and placed in the left common iliac artery.  The patient was fully heparinized.  I then attempted subintimal recanalization of the occluded  superficial femoral artery using a 035 Glidewire and a quick cross catheter.  I had difficulty getting out of a dissection plane and ultimately felt it was best to come retrograde. Ultrasound was used to evaluate the left anterior tibial artery which was calcified.  It was cannulated under ultrasound guidance with a micropuncture needle.  A 018 wire was advanced and a 4 [place] sheath was placed.  Next using a CXI catheter and a V-18 wire, I was able to cross the occlusion in the superficial femoral artery.  From above I was able to snare the V-18 wire and bring it out through the sheath.  I then used a 4 mm balloon to predilate the occlusion and stented it with overlapping 6 mm Elluvia stents (6 x 120, 6 x 120, 6 x 80) the stents were then postdilated with a 5 mm balloon.  Completion imaging was then performed which showed that the superficial femoral artery was now occluded.  There was narrowing in the popliteal artery however this was his baseline and likely from spasm and underfilling.  I elected not to continue stents down into the below-knee popliteal artery. I then elected to stent the left common iliac artery.  This was done using an 8 x 27 Xpress balloon expandable stent.  There is no residual stenosis upon completion.  The long 42 French sheath was then removed and exchanged for a short 6 French sheath in the groin was closed with a Celt. Was unable to sit still and was continuously moving throughout the case making imaging and deployment accuracy very challenging. Impression:  #1  Successful recanalization of an occluded left superficial femoral artery via pedal access and subsequent stenting using 6 mm Elluvia  #2  Greater than 70% left common iliac artery treated using an 8 x 27 balloon expandable stent  #3  Patient had extreme difficulty sitting still and was constantly lifting his head up and moving his pelvis and legs around despite being restrained.  This will need to be factored and should he require  return evaluation.  Theotis Burrow, M.D., FACS [facility] Office:  [phone] Pager:  [phone]  US ARTERIAL SEG MULTIPLE LE (ABI, SEGMENTAL PRESSURES, PVR'S)  Result Date: 06/05/2020 CLINICAL DATA:  Peripheral vascular disease EXAM: NONINVASIVE PHYSIOLOGIC VASCULAR STUDY OF BILATERAL LOWER EXTREMITIES TECHNIQUE: Non-invasive vascular evaluation of both lower extremities was performed at rest, including calculation of ankle-brachial indices, multiple segmental pressure evaluation, segmental Doppler and segmental pulse volume recording. COMPARISON:  None. FINDINGS: Right Lower Extremity Resting ABI:  1.07 Resting TBI: 0.35 Segmental Pressures: Normal segmental pressures, no significant (20 mmHg) pressure gradient between adjacent segments. Great toe pressure: 49 mm Hg Arterial Waveforms: Monophasic waveform seen in the posterior tibial and dorsalis pedis arteries. PVRs: Normal PVRs with maintained waveform amplitude, augmentation and quality. Left Lower Extremity: Resting ABI: 0.41 Resting TBI: 0.33 Segmental Pressures: Decreased segmental pressures seen throughout the left lower extremity. Great toe pressure: 46 mm Hg Arterial Waveforms: Monophasic waveform seen throughout the left lower extremity. PVRs: Normal PVRs with maintained waveform amplitude, augmentation and quality. Other: Symmetric upper extremity pressures. Ankle Brachial index > 1.4 Non diagnostic secondary to incompressible vessel calcifications 1.0-1.4       Normal 0.9-0.99     Borderline PAD 0.8-0.89     Mild PAD 0.5-0.79     Moderate PAD < 0.5          Severe PAD Toe Brachial Index Normal     >0.65 Moderate  0.53-0.64 Severe     <0.23 Toe Pressures Absolute toe pressure >31mHg sufficient for wound healing. Toe pressures <551mg = critical limb ischemia. IMPRESSION: 1. 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 2. Findings suspicious for significant right lower  extremity occlusive disease given moderately decreased TBI. Electronically Signed   By: FaMiachel Roux.D.   On: 06/05/2020 11:34   DG Foot 2 Views Left  Result Date: 06/13/2020 CLINICAL DATA:  Postoperative status. EXAM: LEFT FOOT - 2 VIEW COMPARISON:  Jun 06, 2020. FINDINGS: Status post surgical amputation of right fifth toe. No fracture or dislocation is noted. IMPRESSION: Status post surgical amputation of right fifth toe. Electronically Signed   By: JaMarijo Conception.D.   On: 06/13/2020 18:02   DG Foot Complete Left  Result Date: 06/06/2020 CLINICAL DATA:  Fifth toe infection EXAM: LEFT FOOT - COMPLETE 3+ VIEW COMPARISON:  May 10, 2020, March 08, 2019 FINDINGS: No acute fracture or dislocation. Unchanged well corticated ossific density at the base of the fifth proximal phalanx consistent with sequela of remote prior trauma. Mild midfoot degenerative changes. No area of erosion or osseous destruction. No unexpected radiopaque foreign body. No soft tissue air. Vascular calcifications. Bone island of the calcaneus. IMPRESSION: No radiographic evidence of osteomyelitis. Electronically Signed   By: StValentino SaxonD   On: 06/06/2020 08:40   VAS US[place]OWER EXTREMITY SAPHENOUS VEIN MAPPING  Result Date: 06/09/2020 LOWER EXTREMITY VEIN MAPPING Patient Name:  LoHavard Radigan Date of Exam:   06/08/2020 Medical Rec #: [id number]            Accession #:    [id number] of Birth: 07/1960-08-26            Patient Gender: M Patient Age:   59Y Exam [facility]  [facility] Crocedure:      VAS US[place]OWER EXTREMITY SAPHENOUS VEIN MAPPING Referring Phys: [id number]-------------------------------------------------------------------------------  Indications:  Pre-op History:      History of PAD; patient is pre-operative for lower extremity  bypass graft. Risk Factors: Hypertension, hyperlipidemia, current smoker, coronary artery               disease, PAD.   Limitations: Constant movement secondary to ischemic pain, body habitus Comparison Study: No prior study Performing Technologist: Sharion Dove RVS  Examination Guidelines: A complete evaluation includes B-mode imaging, spectral Doppler, color Doppler, and power Doppler as needed of all accessible portions of each vessel. Bilateral testing is considered an integral part of a complete examination. Limited examinations for reoccurring indications may be performed as noted. +---------------+-----------+----------------------+---------------+-----------+   RT Diameter  RT Findings         GSV            LT Diameter  LT Findings      (cm)                                            (cm)                  +---------------+-----------+----------------------+---------------+-----------+      0.56                     Saphenofemoral         0.30                                                   Junction                                  +---------------+-----------+----------------------+---------------+-----------+      0.34                     Proximal thigh         0.18                  +---------------+-----------+----------------------+---------------+-----------+      0.27       branching       Mid thigh            0.23                  +---------------+-----------+----------------------+---------------+-----------+      0.28                      Distal thigh          2.20       branching  +---------------+-----------+----------------------+---------------+-----------+      0.22                          Knee              0.32                  +---------------+-----------+----------------------+---------------+-----------+      0.25                       Prox calf            0.29                  +---------------+-----------+----------------------+---------------+-----------+      0.10  branching        Mid calf            0.27                   +---------------+-----------+----------------------+---------------+-----------+      0.25                      Distal calf           0.27                  +---------------+-----------+----------------------+---------------+-----------+      0.18       branching         Ankle              0.23                  +---------------+-----------+----------------------+---------------+-----------+ Diagnosing physician: Monica Martinez MD Electronically signed by Monica Martinez MD on 06/09/2020 at 1:56:40 PM.    Final     Microbiology: Recent Results (from the past 240 hour(s))  Surgical pcr screen     Status: None   Collection Time: 06/13/20 12:50 PM   Specimen: Nasal Mucosa; Nasal Swab  Result Value Ref Range Status   MRSA, PCR NEGATIVE NEGATIVE Final   Staphylococcus aureus NEGATIVE NEGATIVE Final    Comment: (NOTE) The Xpert SA Assay (FDA approved for NASAL specimens in patients 58 years of age and older), is one component of a comprehensive surveillance program. It is not intended to diagnose infection nor to guide or monitor treatment. Performed at [facility], [place] [address] [place], [place] [address]   Aerobic/Anaerobic Culture w Gram Stain (surgical/deep wound)     Status: None (Preliminary result)   Collection Time: 06/13/20  1:55 PM   Specimen: PATH Other; Body Fluid  Result Value Ref Range Status   Specimen Description ABSCESS LEFT TOE  Final   Special Requests 5TH TOE PT ON ROCEPHIN,CEFEPIME,FLAGYL,FLAGYL  Final   Gram Stain   Final    RARE WBC PRESENT, PREDOMINANTLY PMN RARE GRAM NEGATIVE RODS Performed at [facility], [address], [place], [place] [address]    Culture   Final    ABUNDANT ENTEROBACTER CLOACAE NO ANAEROBES ISOLATED; CULTURE IN PROGRESS FOR 5 DAYS    Report Status PENDING  Incomplete   Organism ID, Bacteria ENTEROBACTER CLOACAE  Final      Susceptibility   Enterobacter cloacae - MIC*    CEFAZOLIN >=64 RESISTANT Resistant      CEFEPIME <=0.12 SENSITIVE Sensitive     CEFTAZIDIME <=1 SENSITIVE Sensitive     CIPROFLOXACIN <=0.25 SENSITIVE Sensitive     GENTAMICIN <=1 SENSITIVE Sensitive     IMIPENEM <=0.25 SENSITIVE Sensitive     TRIMETH/SULFA <=20 SENSITIVE Sensitive     PIP/TAZO <=4 SENSITIVE Sensitive     * ABUNDANT ENTEROBACTER CLOACAE     Labs: CBC: Recent Labs  Lab 06/11/20 0221 06/12/20 0512 06/14/20 0449 06/16/20 0556  WBC 5.3 7.1 6.8 6.3  HGB 10.4* 10.4* 9.5* 9.9*  HCT 34.2* 32.7* 29.7* 31.7*  MCV 93.7 91.1 90.8 91.1  PLT 267 265 280 426   Basic Metabolic Panel: Recent Labs  Lab 06/11/20 0221 06/12/20 0048 06/12/20 0512 06/14/20 0449 06/15/20 0041 06/16/20 0556  NA 136  --  137 135  --  136  K 3.6  --  4.1 4.0  --  4.2  CL 107  --  106 103  --  104  CO2 22  --  25 26  --  27  GLUCOSE 87  --  101* 99  --  94  BUN 17  --  12 17  --  16  CREATININE 0.82 0.88 0.85 1.11 0.89 0.87  CALCIUM 9.1  --  9.1 8.9  --  9.0  MG  --   --  2.1 2.0  --  2.1   Liver Function Tests: No results for input(s): AST, ALT, ALKPHOS, BILITOT, PROT, ALBUMIN in the last 168 hours. CBG: Recent Labs  Lab 06/13/20 1409  GLUCAP 75    Time spent: 35 minutes  Signed:  Berle Mull  [facility] 06/16/2020

## 2020-06-19 ENCOUNTER — Other Ambulatory Visit: Payer: Self-pay

## 2020-06-19 ENCOUNTER — Encounter (HOSPITAL_BASED_OUTPATIENT_CLINIC_OR_DEPARTMENT_OTHER): Payer: Medicare Other | Attending: Internal Medicine | Admitting: Internal Medicine

## 2020-06-19 DIAGNOSIS — Z833 Family history of diabetes mellitus: Secondary | ICD-10-CM | POA: Insufficient documentation

## 2020-06-19 DIAGNOSIS — F17218 Nicotine dependence, cigarettes, with other nicotine-induced disorders: Secondary | ICD-10-CM | POA: Diagnosis not present

## 2020-06-19 DIAGNOSIS — L97512 Non-pressure chronic ulcer of other part of right foot with fat layer exposed: Secondary | ICD-10-CM | POA: Diagnosis not present

## 2020-06-19 DIAGNOSIS — I10 Essential (primary) hypertension: Secondary | ICD-10-CM | POA: Diagnosis not present

## 2020-06-19 DIAGNOSIS — E1140 Type 2 diabetes mellitus with diabetic neuropathy, unspecified: Secondary | ICD-10-CM | POA: Insufficient documentation

## 2020-06-19 DIAGNOSIS — Z8249 Family history of ischemic heart disease and other diseases of the circulatory system: Secondary | ICD-10-CM | POA: Diagnosis not present

## 2020-06-19 DIAGNOSIS — E11621 Type 2 diabetes mellitus with foot ulcer: Secondary | ICD-10-CM | POA: Diagnosis present

## 2020-06-19 NOTE — Progress Notes (Signed)
ADIN, TROUPE ([id number]) Visit Report for 06/19/2020 Chief Complaint Document Details Patient Name: Date of Service: [name] 06/19/2020 1:15 PM Medical Record Number: [id number] Patient Account Number: [id number] Date of Birth/Sex: Treating RN: 09-07-1960 (60 y.o. [name] Primary Care Provider: Cipriano Mile Other Clinician: Referring Provider: Treating Provider/Extender: Phineas Douglas Weeks in Treatment: 18 Information Obtained from: Patient Chief Complaint Right foot ulcers Electronic Signature(s) Signed: 06/19/2020 2:23:18 PM By: Kalman Shan DO Entered By: Kalman Shan on 06/19/2020 14:19:36 -------------------------------------------------------------------------------- HPI Details Patient Name: Date of Service: [name] RD, [name]. 06/19/2020 1:15 PM Medical Record Number: [id number] Patient Account Number: [id number] Date of Birth/Sex: Treating RN: 1960/07/31 (60 y.o. [name] Primary Care Provider: Cipriano Mile Other Clinician: Referring Provider: Treating Provider/Extender: Phineas Douglas Weeks in Treatment: 58 History of Present Illness HPI Description: 08/15/2019 upon evaluation today patient presents for evaluation here in our clinic concerning issues that he is having with wounds on the dorsal surface of his right foot. This is something that was noted to occur after he was obtaining cortisone injections with [facility]. Subsequently he developed ulcerations he was then referred to Dr. Alvester Chou who performed arterial testing and subsequently found that the patient had poor arterial flow with an ABI of 0.6 and a TBI of 0.26. With that being said he has since on 07/16/2019 had an angiogram on the right with improvement of his ABI to 0.85 and TBI to 0.5. Obviously this is not perfect but is definitely far superior to where it was previous. He does have some necrotic tissue on the surface of the  wounds is going to need debriding away he has been using Silvadene on this but to be honest that is not can to do anything for these wounds. He needs something more to clear this away sharp debridement is ideal and we may even look towards Santyl as well. The patient does have a history of diabetes noted in his chart though is not on medication and his A1c was around 6.4 so is not terrible. With that being said he does have known peripheral vascular disease he is actually have an angiogram on the left tomorrow. He also has hypertension and he does smoke. 8/27; this is a patient has been here once before about 6 weeks ago. Since then he was hospitalized from 7/22 through 7/23 by Dr. Gwenlyn Found for an attempt at revascularization. He had previously been seen by Dr. Amalia Hailey of podiatry. He is a continued tobacco smoker. His angiogram on 6/21 via revealed a occluded SFAs bilaterally with one-vessel runoff. He had a atherectomy followed by a drug-coated balloon angioplasty of the right SFA. 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 He had some improvement in his right ABI from 0.6-0.85. There is also an attempt to open his left SFA but that was unsuccessful. He does not have a wound on the left side. We are using Santyl on the patient's wounds on the right dorsal foot. He says he has about a 1 minute exercise tolerance before stopping because of pain compatible with claudication 9/10; we are using Santyl on the wound on the right dorsal foot. Still has significant claudication symptoms. He is status post angioplasty of the right SFA and atherectomy. He has one-vessel runoff via a diseased peroneal. He follows up with Dr. Gwenlyn Found on 9/14 9/17; I had my notes from last week that the patient was to see  Dr. Gwenlyn Found on 9/14 he tells me that the appointment is actually on 9/21 although I do not see that in epic. The next appointment I see with Dr. Gwenlyn Found is on 11/17. Nevertheless the patient is  fairly adamant that his appointment is early next week I told him to call ahead before he goes. The wound is a lot better we have been using Santyl over that he just ran out of that all represcribe it. 10/1; patient arrives today with nothing on his wound. He says he has been using the Santyl but he feels the border foam is too painful. We told him he needs to put a covering on this of some sort perhaps gauze. Indeed after our discussion last time his appointment with Dr. Gwenlyn Found 11/17. Previously he has had a atherectomy and an angioplasty of the right SFA. I note in my previous notes it was said he had bilateral one-vessel runoff I'll need to review the tibial vessels on the angiogram. The patient is currently rating his pain at 8 out of 10 He followed up with Dr. Amalia Hailey at [facility] on 9/27. He emphasized to follow-up with vascular and [place]. Follow-up with them as needed. 10/15; patient ran out of Santyl and has been using Silvadene. He still has constant pain which I think is largely claudication. I have looked over his angiogram results. I need to communicate with Dr. Gwenlyn Found to see if anything else can be done here. He arrives in clinic today with tendon over most of the wound surface area 10/29; we are using silver collagen on the right foot. Still complaining of a lot of pain especially at night. Dr. Gwenlyn Found is out of town this week I will send him an email but he has a follow-up appointment on 11/17. 11/18; patient saw Dr. Gwenlyn Found yesterday. He has a history of a directional atherectomy followed by a drug-coated balloon angioplasty on the right SFA. 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 His right ABI increased from 0.6 2.85 however the right foot wound has not budged. He is going next Wednesday for an attempt to do tibial artery interventions. He mentions that he has one-vessel runoff via the peroneal.. They are going to attempt to look at  the anterior tibial and posterior tibial revascularization next Wednesday. If they were successful at this then I will consider debridement and changing the topical dressings to this wound. Patient is still in a lot of pain 12/2; the patient underwent revascularization by Dr. Fletcher Anon on 12/19/2019. 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 The posterior tibial artery gives good flow into the whole pedal arch getting collaterals from the peroneal artery the anterior tibial artery was occluded. Unfortunately his right anterior tibial artery is not amenable for revascularization. He had a drug-eluting stent placed in the right common iliac artery into the external iliac artery. The patient arrives saying his pain is about the same. He still has exposed tendon in the base of the wound however there is some healthy looking granulation at the circumference. Perhaps reason for some guarded optimism. Comes in today not wanting debridement asking for pain medication 02/22/2020; patient has not been here in almost 2 months. He has a punched-out area on the right dorsal foot.  Completely necrotic surface. We have been using silver collagen when he was last here the beginning of December he ran out of this a long time ago. I think he has been using Silvadene cream. He follows up with Dr. Fletcher Anon or Dr. Gwenlyn Found next week. He arrives in clinic as usual not wanting debridement unfortunately its debridement is what he needs 2/11; this area on the right foot looks somewhat better than when I saw this 2 weeks ago. I did receive a call from Dr. Gwenlyn Found who went over his PAD history. He had a directional atherectomy followed by drug coated balloon angioplasty of his right SFA on 07/16/2019 his right ABI increased from 0.6-0.85 at that  point. He underwent a right external iliac artery stenting by Dr. Fletcher Anon on 12/19/2019 his right SFA was patent as was his peroneal. He did not have a patent posterior tibial at the level of the ankle the only patent vessel to his foot is the anterior tibial artery although that is fortunate in that that is where the or the major wound is. He was not felt to be a candidate for any further angiographic intervention 03/28/2020 upon evaluation today patient appears to be doing well with regard to his wound. Is been tolerating the dressing changes without complication. With that being said he has been using Santyl this definitely seems to be doing better than nothing is for breakdown some of the slough and biofilm on the surface of the wound. Fortunately there is no evidence of active infection which is great news. No fevers, chills, nausea, vomiting, or diarrhea. 3/25; since the patient was last here I was called by Dr. Gwenlyn Found. They do not anticipate any more interventions at this point. I have listed his impressions on the right leg vasculature below " Mr. Hegewald returns today for follow-up. He follows with Dr. Dellia Nims at the wound care clinic for a slowly healing wound on the dorsal aspect of his right foot. He has had 2 interventions on this, one in July 2021 where I revascularized an occluded right SFA. He had one-vessel runoff. He did have spasm of his peroneal which ultimately was shown to have resolved repeat CT angiography. In November he had stenting of a calcified physiologically significant right external iliac artery. His anterior tibial artery does not reach past the ankle and therefore revascularization would be difficult. I would continue with aggressive local wound care. He also has claudication on the left. I attempted left SFA intervention in July but was unsuccessful in reentering the distal lumen. Unfortunately, he only has 1 patent tibial vessel on the left common anterior tibial, and no  evidence of critical limb ischemia so I am hesitant to recommend tibial pedal access for a retrograde approach." He has been using Santyl on the right dorsal foot. Arrives with the wound above 0.5 cm in depth the surface does not look too bad. He is also complaining about pain in the left fifth toe without any antecedent trauma he is aware of. Says that the pain has been there for the last 5 or 6 weeks 4/15; the area on his right dorsal foot perhaps slightly better. We have been using Santyl. Dr. Kennon Holter last opinions on his vascular supply are listed in my note of 04/18/2020. Again he complains of pain in the left fifth toe. We ordered an x-ray of this almost 3 weeks ago. He did not get it done 4/29; patient has been using Santyl to the right dorsal foot wound. He reports  improvement to the wound. He denies signs and symptoms of infection. He was able to obtain the left foot x-ray that was ordered because he was complaining about toe pain at last clinic visit. 5/26; patient was last seen a month ago. He continue to use Santyl over the last month to his right dorsal foot wound. He reports that it is closed. Electronic Signature(s) Signed: 06/19/2020 2:23:18 PM By: Kalman Shan DO Entered By: Kalman Shan on 06/19/2020 14:20:10 -------------------------------------------------------------------------------- Physical Exam Details Patient Name: Date of Service: [name] 06/19/2020 1:15 PM Medical Record Number: [id number] Patient Account Number: [id number] Date of Birth/Sex: Treating RN: [birth year] (60 y.o. [name] Primary Care Provider: Cipriano Mile Other Clinician: Referring Provider: Treating Provider/Extender: Phineas Douglas Weeks in Treatment: 17 Constitutional respirations regular, non-labored and within target range for patient.Marland [facility] pleasant and cooperative. Notes Right dorsal foot: Epithelialization to the previous wound site. No  signs of infection. Electronic Signature(s) Signed: 06/19/2020 2:23:18 PM By: Kalman Shan DO Entered By: Kalman Shan on 06/19/2020 14:20:44 -------------------------------------------------------------------------------- Physician Orders Details Patient Name: Date of Service: [name], [name] 06/19/2020 1:15 PM Medical Record Number: [id number] Patient Account Number: [id number] Date of Birth/Sex: Treating RN: [birth year] (60 y.o. [name] Primary Care Provider: Cipriano Mile Other Clinician: Referring Provider: Treating Provider/Extender: Phineas Douglas Weeks in Treatment: 103 Verbal / Phone Orders: No Diagnosis Coding ICD-10 Coding Code Description I73.89 Other specified peripheral vascular diseases E11.621 Type 2 diabetes mellitus with foot ulcer L97.512 Non-pressure chronic ulcer of other part of right foot with fat layer exposed F17.218 Nicotine dependence, cigarettes, with other nicotine-induced disorders M79.672 Pain in left foot Discharge From [facility] Discharge from [facility] - call if any future wound care needs. closely monitor feet daily. may apply hydrocortisone cream for itching to foot. Follow up with primary care provider if itching continues. Electronic Signature(s) Signed: 06/19/2020 2:23:18 PM By: Kalman Shan DO Entered By: Kalman Shan on 06/19/2020 14:20:57 -------------------------------------------------------------------------------- Problem List Details Patient Name: Date of Service: [name], [name] 06/19/2020 1:15 PM Medical Record Number: [id number] Patient Account Number: [id number] Date of Birth/Sex: Treating RN: 1960/08/14 (60 y.o. [name] Primary Care Provider: Cipriano Mile Other Clinician: Referring Provider: Treating Provider/Extender: Phineas Douglas Weeks in Treatment: 41 Active Problems ICD-10 Encounter Code Description Active Date  MDM Diagnosis I73.89 Other specified peripheral vascular diseases 08/15/2019 No Yes E11.621 Type 2 diabetes mellitus with foot ulcer 08/15/2019 No Yes L97.512 Non-pressure chronic ulcer of other part of right foot with fat layer exposed 08/15/2019 No Yes F17.218 Nicotine dependence, cigarettes, with other nicotine-induced disorders 08/15/2019 No Yes M79.672 Pain in left foot 04/18/2020 No Yes Inactive Problems ICD-10 Code Description Active Date Inactive Date I10 Essential (primary) hypertension 08/15/2019 08/15/2019 Resolved Problems Electronic Signature(s) Signed: 06/19/2020 2:23:18 PM By: Kalman Shan DO Entered By: Kalman Shan on 06/19/2020 14:18:53 -------------------------------------------------------------------------------- Progress Note Details Patient Name: Date of Service: [name], [name]. 06/19/2020 1:15 PM Medical Record Number: [id number] Patient Account Number: [id number] Date of Birth/Sex: Treating RN: [birth year] (60 y.o. [name] Primary Care Provider: Cipriano Mile Other Clinician: Referring Provider: Treating Provider/Extender: Phineas Douglas Weeks in Treatment: 13 Subjective Chief Complaint Information obtained from Patient Right foot ulcers History of Present Illness (HPI) 08/15/2019 upon evaluation today patient presents for evaluation here in our clinic concerning issues that he is having with wounds on the dorsal surface of his right foot. This is something  that was noted to occur after he was obtaining cortisone injections with [facility]. Subsequently he developed ulcerations he was then referred to Dr. Alvester Chou who performed arterial testing and subsequently found that the patient had poor arterial flow with an ABI of 0.6 and a TBI of 0.26. With that being said he has since on 07/16/2019 had an angiogram on the right with improvement of his ABI to 0.85 and TBI to 0.5. Obviously this is not perfect but is definitely far  superior to where it was previous. He does have some necrotic tissue on the surface of the wounds is going to need debriding away he has been using Silvadene on this but to be honest that is not can to do anything for these wounds. He needs something more to clear this away sharp debridement is ideal and we may even look towards Santyl as well. The patient does have a history of diabetes noted in his chart though is not on medication and his A1c was around 6.4 so is not terrible. With that being said he does have known peripheral vascular disease he is actually have an angiogram on the left tomorrow. He also has hypertension and he does smoke. 8/27; this is a patient has been here once before about 6 weeks ago. Since then he was hospitalized from 7/22 through 7/23 by Dr. Gwenlyn Found for an attempt at revascularization. He had previously been seen by Dr. Amalia Hailey of podiatry. He is a continued tobacco smoker. His angiogram on 6/21 via revealed a occluded SFAs bilaterally with one-vessel runoff. He had a atherectomy followed by a drug-coated balloon angioplasty of the right SFA. 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 He had some improvement in his right ABI from 0.6-0.85. There is also an attempt to open his left SFA but that was unsuccessful. He does not have a wound on the left side. We are using Santyl on the patient's wounds on the right dorsal foot. He says he has about a 1 minute exercise tolerance before stopping because of pain compatible with claudication 9/10; we are using Santyl on the wound on the right dorsal foot. Still has significant claudication symptoms. He is status post angioplasty of the right SFA and atherectomy. He has one-vessel runoff via a diseased peroneal. He follows up with Dr. Gwenlyn Found on 9/14 9/17; I had my notes from last week that the patient was to see Dr. Gwenlyn Found on 9/14 he tells me that the appointment is actually on 9/21 although I do not see that  in epic. The next appointment I see with Dr. Gwenlyn Found is on 11/17. Nevertheless the patient is fairly adamant that his appointment is early next week I told him to call ahead before he goes. The wound is a lot better we have been using Santyl over that he just ran out of that all represcribe it. 10/1; patient arrives today with nothing on his wound. He says he has been using the Santyl but he feels the border foam is too painful. We told him he needs to put a covering on this of some sort perhaps gauze. Indeed after our discussion last time his appointment with Dr. Gwenlyn Found 11/17. Previously he has had a atherectomy and an angioplasty of the right SFA. I note in my previous notes it was said he had bilateral one-vessel runoff I'll need to review the tibial vessels on the angiogram. The patient is currently rating his pain at 8 out of  10 He followed up with Dr. Amalia Hailey at [facility] on 9/27. He emphasized to follow-up with vascular and [place]. Follow-up with them as needed. 10/15; patient ran out of Santyl and has been using Silvadene. He still has constant pain which I think is largely claudication. I have looked over his angiogram results. I need to communicate with Dr. Gwenlyn Found to see if anything else can be done here. He arrives in clinic today with tendon over most of the wound surface area 10/29; we are using silver collagen on the right foot. Still complaining of a lot of pain especially at night. Dr. Gwenlyn Found is out of town this week I will send him an email but he has a follow-up appointment on 11/17. 11/18; patient saw Dr. Gwenlyn Found yesterday. He has a history of a directional atherectomy followed by a drug-coated balloon angioplasty on the right SFA. 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 His right ABI increased from 0.6 2.85 however the right foot wound has not budged. He is going next Wednesday for an attempt to do tibial artery interventions. He  mentions that he has one-vessel runoff via the peroneal.. They are going to attempt to look at the anterior tibial and posterior tibial revascularization next Wednesday. If they were successful at this then I will consider debridement and changing the topical dressings to this wound. Patient is still in a lot of pain 12/2; the patient underwent revascularization by Dr. Fletcher Anon on 12/19/2019. 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 The posterior tibial artery gives good flow into the whole pedal arch getting collaterals from the peroneal artery the anterior tibial artery was occluded. Unfortunately his right anterior tibial artery is not amenable for revascularization. He had a drug-eluting stent placed in the right common iliac artery into the external iliac artery. The patient arrives saying his pain is about the same. He still has exposed tendon in the base of the wound however there is some healthy looking granulation at the circumference. Perhaps reason for some guarded optimism. Comes in today not wanting debridement asking for pain medication 02/22/2020; patient has not been here in almost 2 months. He has a punched-out area on the right dorsal foot. Completely necrotic surface. We have been using silver collagen when he was last here the beginning of December he ran out of this a long time ago. I think he has been using Silvadene cream. He follows up with Dr. Fletcher Anon or Dr. Gwenlyn Found next week. He arrives in clinic as usual not wanting debridement unfortunately its debridement is what he needs 2/11; this area on the right foot looks somewhat better than when I saw this 2 weeks ago. I did receive a call from Dr. Gwenlyn Found who went over his PAD history. He had a directional atherectomy followed by drug coated  balloon angioplasty of his right SFA on 07/16/2019 his right ABI increased from 0.6-0.85 at that point. He underwent a right external iliac artery stenting by Dr. Fletcher Anon on 12/19/2019 his right SFA was patent as was his peroneal. He did not have a patent posterior tibial at the level of the ankle the only patent vessel to his foot is the anterior tibial artery although that is fortunate in that that is where the or  the major wound is. He was not felt to be a candidate for any further angiographic intervention 03/28/2020 upon evaluation today patient appears to be doing well with regard to his wound. Is been tolerating the dressing changes without complication. With that being said he has been using Santyl this definitely seems to be doing better than nothing is for breakdown some of the slough and biofilm on the surface of the wound. Fortunately there is no evidence of active infection which is great news. No fevers, chills, nausea, vomiting, or diarrhea. 3/25; since the patient was last here I was called by Dr. Gwenlyn Found. They do not anticipate any more interventions at this point. I have listed his impressions on the right leg vasculature below " Mr. Hudek returns today for follow-up. He follows with Dr. Dellia Nims at the wound care clinic for a slowly healing wound on the dorsal aspect of his right foot. He has had 2 interventions on this, one in July 2021 where I revascularized an occluded right SFA. He had one-vessel runoff. He did have spasm of his peroneal which ultimately was shown to have resolved repeat CT angiography. In November he had stenting of a calcified physiologically significant right external iliac artery. His anterior tibial artery does not reach past the ankle and therefore revascularization would be difficult. I would continue with aggressive local wound care. He also has claudication on the left. I attempted left SFA intervention in July but was unsuccessful in reentering the distal  lumen. Unfortunately, he only has 1 patent tibial vessel on the left common anterior tibial, and no evidence of critical limb ischemia so I am hesitant to recommend tibial pedal access for a retrograde approach." He has been using Santyl on the right dorsal foot. Arrives with the wound above 0.5 cm in depth the surface does not look too bad. He is also complaining about pain in the left fifth toe without any antecedent trauma he is aware of. Says that the pain has been there for the last 5 or 6 weeks 4/15; the area on his right dorsal foot perhaps slightly better. We have been using Santyl. Dr. Kennon Holter last opinions on his vascular supply are listed in my note of 04/18/2020. Again he complains of pain in the left fifth toe. We ordered an x-ray of this almost 3 weeks ago. He did not get it done 4/29; patient has been using Santyl to the right dorsal foot wound. He reports improvement to the wound. He denies signs and symptoms of infection. He was able to obtain the left foot x-ray that was ordered because he was complaining about toe pain at last clinic visit. 5/26; patient was last seen a month ago. He continue to use Santyl over the last month to his right dorsal foot wound. He reports that it is closed. Patient History Information obtained from Patient. Family History Diabetes - Siblings, Heart Disease - Father, No family history of Cancer, Hereditary Spherocytosis, Hypertension, Kidney Disease, Lung Disease, Seizures, Stroke, Thyroid Problems, Tuberculosis. Social History Current every day smoker - 1/2 pack per day, Marital Status - Separated, Alcohol Use - Moderate, Drug Use - No History, Caffeine Use - Rarely. 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 Sherryle Lis  podiatry. Medical A Surgical History Notes nd Cardiovascular Hyperlipidemia Objective Constitutional respirations regular, non-labored and within target range for patient.Marland Kitchen  Vitals Time Taken: 1:55 PM, Height: 62 in, Weight: 130 lbs, BMI: 23.8, Temperature: 98.7 F, Pulse: 108 bpm, Respiratory Rate: 17 breaths/min, Blood Pressure: 146/80 mmHg. Psychiatric pleasant and cooperative. General Notes: Right dorsal foot: Epithelialization to the previous wound site. No signs of infection. Integumentary (Hair, Skin) Wound #1 status is Open. Original cause of wound was Gradually Appeared. The date acquired was: 03/26/2019. The wound has been in treatment 44 weeks. The wound is located on the Right,Dorsal Foot. The wound measures 0cm length x 0cm width x 0cm depth; 0cm^2 area and 0cm^3 volume. Assessment Active Problems ICD-10 Other specified peripheral vascular diseases Type 2 diabetes mellitus with foot ulcer Non-pressure chronic ulcer of other part of right foot with fat layer exposed Nicotine dependence, cigarettes, with other nicotine-induced disorders Pain in left foot Patient's right foot wound is closed. He used Santyl for the past month and this helped heal it. He does have areas where he has been scratching and I recommended some hydrocortisone cream to this. He can follow-up as needed Plan Discharge From [facility] Services: Discharge from [place] - call if any future wound care needs. closely monitor feet daily. may apply hydrocortisone cream for itching to foot. Follow up with primary care provider if itching continues. 1. Discharge from our clinic due to closed wound 2. Follow-up as needed Electronic Signature(s) Signed: 06/19/2020 2:23:18 PM By: Kalman Shan DO Entered By: Kalman Shan on 06/19/2020 14:22:40 -------------------------------------------------------------------------------- HxROS Details Patient Name: Date of Service: [name] RD, Ruben Kalata J. 06/19/2020  1:15 PM Medical Record Number: [id number] Patient Account Number: [id number] Date of Birth/Sex: Treating RN: [birth year] (60 y.o. [name] Primary Care Provider: Cipriano Mile Other Clinician: Referring Provider: Treating Provider/Extender: Phineas Douglas Weeks in Treatment: 61 Information Obtained From Patient Cardiovascular Medical History: Positive for: Hypertension; Peripheral Arterial Disease Past Medical History Notes: Hyperlipidemia Endocrine Medical History: Positive for: Type II Diabetes Treated with: Diet Blood sugar tested every day: No Neurologic Medical History: Positive for: Neuropathy Immunizations Pneumococcal Vaccine: Received Pneumococcal Vaccination: No Implantable Devices None Hospitalization / Surgery History Type of Hospitalization/Surgery 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 Sherryle Lis podiatry Family and Social History Cancer: No; Diabetes: Yes - Siblings; Heart Disease: Yes - Father; Hereditary Spherocytosis: No; Hypertension: No; Kidney Disease: No; Lung Disease: No; Seizures: No; Stroke: No; Thyroid Problems: No; Tuberculosis: No; Current every day smoker - 1/2 pack per day; Marital Status - Separated; Alcohol Use: Moderate; Drug Use: No History; Caffeine Use: Rarely; Financial Concerns: No; Food, Clothing or Shelter Needs: No; Support System Lacking: No; Transportation Concerns: No Electronic Signature(s) Signed: 06/19/2020 2:23:18 PM By: Kalman Shan DO Signed: 06/19/2020 6:02:39 PM By: Deon Pilling Entered By: Kalman Shan on 06/19/2020 14:20:22 -------------------------------------------------------------------------------- SuperBill Details Patient Name: Date of Service: [name], [name] 06/19/2020 Medical Record Number: [id number] Patient Account Number: [id number] Date of Birth/Sex: Treating RN: [birth year] (60 y.o. [name] Primary Care Provider: Cipriano Mile Other Clinician: Referring  Provider: Treating Provider/Extender: Phineas Douglas Weeks in Treatment: 46 Diagnosis Coding ICD-10 Codes Code Description I73.89 Other specified peripheral vascular diseases E11.621 Type 2 diabetes mellitus with foot ulcer L97.512 Non-pressure chronic ulcer of other part of right foot with fat layer exposed F17.218 Nicotine dependence, cigarettes, with other nicotine-induced disorders M79.672 Pain in left foot Facility Procedures CPT4 Code: [id number] Description: 99213 - WOUND CARE VISIT-LEV 3 EST PT Modifier: Quantity: 1 Physician Procedures : CPT4 Code Description Modifier [id number] - WC PHYS LEVEL 3 - EST  PT 1 ICD-10 Diagnosis Description E11.621 Type 2 diabetes mellitus with foot ulcer Quantity: Electronic Signature(s) Signed: 06/19/2020 2:23:18 PM By: Kalman Shan DO Entered By: Kalman Shan on 06/19/2020 14:22:57

## 2020-06-19 NOTE — Progress Notes (Signed)
[name] ([id number]) Visit Report for 06/19/2020 Arrival Information Details Patient Name: Date of Service: [name] 06/19/2020 1:15 PM Medical Record Number: [id number] Patient Account Number: [id number] Date of Birth/Sex: Treating RN: 24-Jul-1960 (60 y.o. [name] Primary Care [name]: [name] Other Clinician: Referring Zainab Crumrine: Treating [name]/Extender: [name] in Treatment: 39 Visit Information History Since Last Visit Added or deleted any medications: No Patient Arrived: [name] Any new allergies or adverse reactions: No Arrival Time: 13:53 Had a fall or experienced change in No Accompanied By: self activities of daily living that may affect Transfer Assistance: None risk of falls: Patient Identification Verified: Yes Signs or symptoms of abuse/neglect since last visito No Secondary Verification Process Completed: Yes Hospitalized since last visit: No Patient Requires Transmission-Based Precautions: No Implantable device outside of the clinic excluding No Patient Has Alerts: Yes cellular tissue based products placed in the center Patient Alerts: Patient on Blood Thinner since last visit: R ABI: 0.85 R TBI: 0.50 Has Dressing in Place as Prescribed: Yes Pain Present Now: Yes Electronic Signature(s) Signed: 06/19/2020 4:57:49 PM By: [name] Entered By: [name] -------------------------------------------------------------------------------- Clinic Level of Care Assessment Details Patient Name: Date of Service: [name] 06/19/2020 1:15 PM Medical Record Number: [id number] Patient Account Number: [id number] Date of Birth/Sex: Treating RN: [birth year] (60 y.o. [name] Primary Care Chanee Henrickson: [name] Other Clinician: Referring [name]: Treating [name]/Extender: [name] in Treatment: 17 Clinic Level of Care  Assessment Items TOOL 4 Quantity Score X- 1 0 Use when only an EandM is performed on FOLLOW-UP visit ASSESSMENTS - Nursing Assessment / Reassessment X- 1 10 Reassessment of Co-morbidities (includes updates in patient status) X- 1 5 Reassessment of Adherence to Treatment Plan ASSESSMENTS - Wound and Skin A ssessment / Reassessment X - Simple Wound Assessment / Reassessment - one wound 1 5 '[]'$  - 0 Complex Wound Assessment / Reassessment - multiple wounds X- 1 10 Dermatologic / Skin Assessment (not related to wound area) ASSESSMENTS - Focused Assessment X- 1 5 Circumferential Edema Measurements - multi extremities X- 1 10 Nutritional Assessment / Counseling / Intervention '[]'$  - 0 Lower Extremity Assessment (monofilament, tuning fork, pulses) '[]'$  - 0 Peripheral Arterial Disease Assessment (using hand held doppler) ASSESSMENTS - Ostomy and/or Continence Assessment and Care '[]'$  - 0 Incontinence Assessment and Management '[]'$  - 0 Ostomy Care Assessment and Management (repouching, etc.) PROCESS - Coordination of Care X - Simple Patient / Family Education for ongoing care 1 15 '[]'$  - 0 Complex (extensive) Patient / Family Education for ongoing care X- 1 10 Staff obtains Programmer, systems, Records, T Results / Process Orders est '[]'$  - 0 Staff telephones HHA, Nursing Homes / Clarify orders / etc '[]'$  - 0 Routine Transfer to another Facility (non-emergent condition) '[]'$  - 0 Routine Hospital Admission (non-emergent condition) '[]'$  - 0 New Admissions / Biomedical engineer / Ordering NPWT Apligraf, etc. , '[]'$  - 0 Emergency Hospital Admission (emergent condition) X- 1 10 Simple Discharge Coordination '[]'$  - 0 Complex (extensive) Discharge Coordination PROCESS - Special Needs '[]'$  - 0 Pediatric / Minor Patient Management '[]'$  - 0 Isolation Patient Management '[]'$  - 0 Hearing / Language / Visual special needs '[]'$  - 0 Assessment of Community assistance (transportation, D/C planning, etc.) '[]'$  -  0 Additional assistance / Altered mentation '[]'$  - 0 Support Surface(s) Assessment (bed, cushion, seat, etc.) INTERVENTIONS - Wound Cleansing / Measurement X - Simple Wound Cleansing - one  wound 1 5 '[]'$  - 0 Complex Wound Cleansing - multiple wounds X- 1 5 Wound Imaging (photographs - any number of wounds) '[]'$  - 0 Wound Tracing (instead of photographs) X- 1 5 Simple Wound Measurement - one wound '[]'$  - 0 Complex Wound Measurement - multiple wounds INTERVENTIONS - Wound Dressings '[]'$  - 0 Small Wound Dressing one or multiple wounds '[]'$  - 0 Medium Wound Dressing one or multiple wounds '[]'$  - 0 Large Wound Dressing one or multiple wounds '[]'$  - 0 Application of Medications - topical '[]'$  - 0 Application of Medications - injection INTERVENTIONS - Miscellaneous '[]'$  - 0 External ear exam '[]'$  - 0 Specimen Collection (cultures, biopsies, blood, body fluids, etc.) '[]'$  - 0 Specimen(s) / Culture(s) sent or taken to Lab for analysis '[]'$  - 0 Patient Transfer (multiple staff / Civil Service fast streamer / Similar devices) '[]'$  - 0 Simple Staple / Suture removal (25 or less) '[]'$  - 0 Complex Staple / Suture removal (26 or more) '[]'$  - 0 Hypo / Hyperglycemic Management (close monitor of Blood Glucose) '[]'$  - 0 Ankle / Brachial Index (ABI) - do not check if billed separately X- 1 5 Vital Signs Has the patient been seen at the hospital within the last three years: Yes Total Score: 100 Level Of Care: New/Established - Level 3 Electronic Signature(s) Signed: 06/19/2020 6:02:39 PM By: [name] Entered By: [name] on Huang 14:08:16 -------------------------------------------------------------------------------- Encounter Discharge Information Details Patient Name: Date of Service: [name] RD, Ruben Kalata J. 06/19/2020 1:15 PM Medical Record Number: [id number] Patient Account Number: [id number] Date of Birth/Sex: Treating RN: 08/08/1960 (60 y.o. 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 Electronic Signature(s) Signed: 06/19/2020 6:02:39 PM By: [name] Entered By: [name] on Huang 14:15:39 -------------------------------------------------------------------------------- Lower Extremity Assessment Details Patient Name: Date of Service: [name] 06/19/2020 1:15 PM Medical Record Number: [id number] Patient Account Number: [id number] Date of Birth/Sex: Treating RN: [birth year] (60 y.o. [name] Primary Care Janiya Millirons: [name] Other Clinician: Referring Sabrea Sankey: Treating Alannah Averhart/Extender: [name] in Treatment: 26 Edema Assessment Assessed: [Left: No] [Right: Yes] Edema: [Left: No] [Right: No] Calf Left: Right: Point of Measurement: 28 cm From Medial Instep 27 cm Ankle Left: Right: Point of Measurement: 9 cm From Medial Instep 18 cm Electronic Signature(s) Signed: 06/19/2020 6:02:39 PM By: [name] Entered By: [name] on Huang 14:01:56 -------------------------------------------------------------------------------- Multi Wound Chart Details Patient Name: Date of Service: [name] RD, [name] 06/19/2020 1:15 PM Medical Record Number: [id number] Patient Account Number: [id number] Date of Birth/Sex: Treating RN: 1960-05-25 (60 y.o. [name] Primary Care Dymin Dingledine: [name] Other Clinician: Referring Shreyas Piatkowski: Treating Jerrel Tiberio/Extender: [name] in Treatment: 49 Vital Signs Height(in): 62 Pulse(bpm):  108 Weight(lbs): 130 Blood Pressure(mmHg): 146/80 Body Mass Index(BMI): 24 Temperature(F): 98.7 Respiratory Rate(breaths/min): 17 Photos: [1:No Photos Right, Dorsal Foot] [N/A:N/A N/A] Wound Location: [1:Gradually Appeared] [N/A:N/A] Wounding Event: [1:Diabetic Wound/Ulcer of the Lower] [N/A:N/A] Primary Etiology: [1:Extremity Arterial Insufficiency Ulcer] [N/A:N/A] Secondary Etiology: [1:03/26/2019] [N/A:N/A] Date Acquired: [1:44] [N/A:N/A] Huang of Treatment: [1:Open] [N/A:N/A] Wound Status: [1:0x0x0] [N/A:N/A] Measurements L x W x D (cm) [1:0] [N/A:N/A] A (cm) : rea [1:0] [N/A:N/A]  Volume (cm) : [1:100.00%] [N/A:N/A] % Reduction in Area: [1:100.00%] [N/A:N/A] % Reduction in Volume: [1:Grade 2] [N/A:N/A] Treatment Notes Electronic Signature(s) Signed: 06/19/2020 2:23:18 PM By: Kalman Shan DO Signed: 06/19/2020 6:02:39 PM By: [name] Entered By: Kalman Shan on Huang 14:19:22 -------------------------------------------------------------------------------- Multi-Disciplinary Care Plan Details Patient Name: Date of Service: [name] RD, [name] 06/19/2020 1:15 PM Medical Record Number: [id number] Patient Account Number: [id number] Date of Birth/Sex: Treating RN: [birth year] (60 y.o. [name] Primary Care Dayshaun Whobrey: [name] Other Clinician: Referring Zykera Abella: Treating Eufelia Veno/Extender: [name] in Treatment: [place] reviewed with physician Active Inactive Electronic Signature(s) Signed: 06/19/2020 6:02:39 PM By: [name] Entered By: [name] on Huang 14:02:17 -------------------------------------------------------------------------------- Pain Assessment Details Patient Name: Date of Service: [name] 06/19/2020 1:15 PM Medical Record Number: [id number] Patient Account Number: [id number] Date of Birth/Sex: Treating RN: 13-Oct-1960 (60 y.o. [name] Primary Care Xerxes Agrusa: [name] Other Clinician: Referring Benton Tooker: Treating Luvern Mischke/Extender: [name] in Treatment: 29 Active Problems Location of Pain Severity and Description of Pain Patient Has Paino No Site Locations Pain Management and Medication Current Pain Management: Electronic Signature(s) Signed: 06/19/2020 4:57:49 PM By: [name] Signed: 06/19/2020 6:02:39 PM By: [name] Entered By: [name] 13:55:20 -------------------------------------------------------------------------------- Patient/Caregiver Education Details Patient Name: Date of Service: [name] 5/26/2022andnbsp1:15 PM Medical Record Number: [id number] Patient Account Number: [id number] Date of Birth/Gender: Treating RN: 02-Aug-1960 (60 y.o. [name] Primary Care Physician: [name] Other Clinician: Referring Physician: Treating Physician/Extender: [name] in Treatment: 35 Education Assessment Education Provided To: Patient Education Topics Provided Wound/Skin Impairment: Handouts: Skin Care Do's and Dont's Methods: Explain/Verbal Responses: Reinforcements needed Electronic Signature(s) Signed: 06/19/2020 6:02:39 PM By: [name] Entered By: [name] on Huang 14:02:29 -------------------------------------------------------------------------------- Wound Assessment Details Patient Name: Date of Service: [name]. 06/19/2020 1:15 PM Medical Record Number: [id number] Patient Account Number: [id number] Date of Birth/Sex: Treating RN: 05/22/1960 (60 y.o. [name] Primary Care Bralynn Donado: [name] Other Clinician: Referring Clotilda Hafer: Treating Savera Donson/Extender: [name] in Treatment: 65 Wound Status Wound Number: 1 Primary Diabetic Wound/Ulcer of the Lower Extremity Etiology: Wound Location: Right, Dorsal  Foot Secondary Arterial Insufficiency Ulcer Wounding Event: Gradually Appeared Etiology: Date Acquired: 03/26/2019 Wound Status: Healed - Epithelialized Huang Of Treatment: 44 Comorbid Hypertension, Peripheral Arterial Disease, Type II Diabetes, Clustered Wound: No History: Neuropathy Photos Wound Measurements Length: (cm) Width: (cm) Depth: (cm) Area: (cm) Volume: (cm) 0 % Reduction in Area: 100% 0 % Reduction in Volume: 100% 0 Epithelialization: Small (1-33%) 0 0 Wound Description Classification: Grade 2 Wound Margin: Distinct, outline attached Exudate Amount: Small Exudate Type: Serosanguineous Exudate Color: red, brown Foul Odor After Cleansing: No Slough/Fibrino Yes Wound Bed Granulation Amount: Large (67-100%) Exposed Structure Granulation Quality: Red Fascia Exposed: No Necrotic Amount: None Present (0%) Fat Layer (Subcutaneous Tissue) Exposed: Yes Tendon Exposed: No Muscle Exposed: No Joint Exposed: No Bone Exposed: No Electronic Signature(s) Signed: 06/19/2020 4:57:49 PM By: [name] Signed: 06/19/2020 6:02:39 PM By: [name] Entered By: [name] 16:36:44 -------------------------------------------------------------------------------- Vitals Details Patient Name: Date of Service: [name] RD, Ruben Kalata J. 06/19/2020 1:15 PM Medical Record Number: [id number] Patient Account Number: [id number] Date of Birth/Sex: Treating RN: 05/30/1960 (60 y.o. [name] Primary Care Lexus Barletta: [name] Other Clinician: Referring Cristella Stiver: Treating Elon Lomeli/Extender: [name] in Treatment: 58 Vital Signs  Time Taken: 13:55 Temperature (F): 98.7 Height (in): 62 Pulse (bpm): 108 Weight (lbs): 130 Respiratory Rate (breaths/min): 17 Body Mass Index (BMI): 23.8 Blood Pressure (mmHg): 146/80 Reference Range: 80 - 120 mg / dl Electronic Signature(s) Signed: 06/19/2020 4:57:49 PM By: [name] Entered By: [name] 13:55:14

## 2020-06-20 ENCOUNTER — Encounter (HOSPITAL_BASED_OUTPATIENT_CLINIC_OR_DEPARTMENT_OTHER): Payer: Medicare Other | Admitting: Internal Medicine

## 2020-06-30 ENCOUNTER — Telehealth: Payer: Self-pay | Admitting: *Deleted

## 2020-06-30 NOTE — Telephone Encounter (Addendum)
Mariam w/ [facility] is calling physician to update on patient. She had an evisit with patient today and he is complaining of increase pain to the left amputation site,stitches are there and appears the site is trying to open up, no signs of infection noticed, has an appointment tomorrow but he is trying to work out transportation issues. She will call patient again to try to reinforce the importance of him getting to this appointment.

## 2020-07-01 ENCOUNTER — Encounter: Payer: Medicare Other | Admitting: Podiatry

## 2020-07-02 NOTE — Progress Notes (Deleted)
Cardiology Office Note:   Date:  07/02/2020  NAME:  [name].    MRN: [id number] DOB:  07/08/1960   PCP:  Cipriano Mile, NP  Cardiologist:  Evalina Field, MD  Electrophysiologist:  None   Referring MD: No ref. provider found   No chief complaint on file.  History of Present Illness:   [name]. is a 60 y.o. male with a hx of PAD, DM, HTN, HLD who presents for follow-up. Had intervention on the left leg in May due to critical limb ischemia. Follow-up today.    Problem List 1. Diabetes -A1c 6.3 2. CKD 3 3. HLD -T chol 144, HDL 45, LDL 87, TG 57 4. HTN 5. Tobacco abuse 6.PAD -Right SFA atherectomy and angioplasty6/21/2021 -75% L renal artery stenosis  -L SFA CTO stent 06/10/2020 -L left common iliac artery stent 06/10/2020  -Critical limb ischemia with poor wound healing of rightanklewounds -R common iliac artery 12/19/2019 stent  Past Medical History: Past Medical History:  Diagnosis Date  . Diabetes mellitus without complication ([place])   . Hyperlipidemia   . Hypertension   . Peripheral vascular disease ([place])   . Tobacco use     Past Surgical History: Past Surgical History:  Procedure Laterality Date  . ABDOMINAL AORTOGRAM W/LOWER EXTREMITY Left 07/16/2019   Procedure: ABDOMINAL AORTOGRAM W/LOWER EXTREMITY;  Surgeon: Lorretta Harp, MD;  Location: [facility];  Service: Cardiovascular;  Laterality: Left;  . ABDOMINAL AORTOGRAM W/LOWER EXTREMITY Left 08/16/2019  . ABDOMINAL AORTOGRAM W/LOWER EXTREMITY N/A 08/16/2019   Procedure: ABDOMINAL AORTOGRAM W/LOWER EXTREMITY;  Surgeon: Lorretta Harp, MD;  Location: [facility];  Service: Cardiovascular;  Laterality: N/A;  . ABDOMINAL AORTOGRAM W/LOWER EXTREMITY Bilateral 12/19/2019   Procedure: ABDOMINAL AORTOGRAM W/LOWER EXTREMITY;  Surgeon: Wellington Hampshire, MD;  Location: [facility];  Service: Cardiovascular;  Laterality: Bilateral;  . ABDOMINAL AORTOGRAM W/LOWER  EXTREMITY Left 06/10/2020   Procedure: ABDOMINAL AORTOGRAM W/LOWER EXTREMITY;  Surgeon: Serafina Mitchell, MD;  Location: [facility];  Service: Cardiovascular;  Laterality: Left;  . AMPUTATION TOE Left 06/13/2020   Procedure: Left Fifth Toe Amputation ;  Surgeon: Criselda Peaches, DPM;  Location: [facility];  Service: Podiatry;  Laterality: Left;  . arm surgery    . PERIPHERAL VASCULAR BALLOON ANGIOPLASTY  08/16/2019   Procedure: PERIPHERAL VASCULAR BALLOON ANGIOPLASTY;  Surgeon: Lorretta Harp, MD;  Location: [facility];  Service: Cardiovascular;;  attempted PTA of Left SFA  . PERIPHERAL VASCULAR INTERVENTION Right 07/16/2019   Procedure: PERIPHERAL VASCULAR INTERVENTION;  Surgeon: Lorretta Harp, MD;  Location: [facility];  Service: Cardiovascular;  Laterality: Right;  . PERIPHERAL VASCULAR INTERVENTION Right 12/19/2019   Procedure: PERIPHERAL VASCULAR INTERVENTION;  Surgeon: Wellington Hampshire, MD;  Location: [facility];  Service: Cardiovascular;  Laterality: Right;  iliac  . PERIPHERAL VASCULAR INTERVENTION Left 06/10/2020   Procedure: PERIPHERAL VASCULAR INTERVENTION;  Surgeon: Serafina Mitchell, MD;  Location: [facility];  Service: Cardiovascular;  Laterality: Left;  SFA /COMMON ILIAC    Current Medications: No outpatient medications have been marked as taking for the 07/04/20 encounter (Appointment) with Geralynn Rile, MD.   Current Facility-Administered Medications for the 07/04/20 encounter (Appointment) with Geralynn Rile, MD  Medication  . sodium chloride flush (NS) 0.9 % injection 3 mL     Allergies:    Patient has no known allergies.   Social History: Social History  Socioeconomic History  . Marital status: Legally Separated    Spouse name: Not on file  . Number of children: Not on file  . Years of education: Not on file  . Highest education level: Not on file  Occupational History  . Not on file  Tobacco Use  .  Smoking status: Current Every Day Smoker    Packs/day: 0.50    Years: 40.00    Pack years: 20.00    Types: Cigarettes  . Smokeless tobacco: Never Used  Vaping Use  . Vaping Use: Never used  Substance and Sexual Activity  . Alcohol use: Yes    Comment: beer and liquor daily  . Drug use: Yes    Frequency: 3.0 times per week    Types: Marijuana  . Sexual activity: Not on file  Other Topics Concern  . Not on file  Social History Narrative  . Not on file   Social Determinants of Health   Financial Resource Strain: Not on file  Food Insecurity: Not on file  Transportation Needs: Not on file  Physical Activity: Not on file  Stress: Not on file  Social Connections: Not on file     Family History: The patient's ***family history includes Cancer in his mother; Diabetes in his father.  ROS:   All other ROS reviewed and negative. Pertinent positives noted in the HPI.     EKGs/Labs/Other Studies Reviewed:   The following studies were personally reviewed by me today:  EKG:  EKG is *** ordered today.  The ekg ordered today demonstrates ***, and was personally reviewed by me.   TTE 07/12/2019  1. Left ventricular ejection fraction, by estimation, is 55 to 60%. Left  ventricular ejection fraction by 3D volume is 59 %. The left ventricle has  normal function. The left ventricle has no regional wall motion  abnormalities. Left ventricular diastolic  parameters are indeterminate. The average left ventricular global  longitudinal strain is -20.4 %. The global longitudinal strain is normal.  2. Right ventricular systolic function is normal. The right ventricular  size is normal. Tricuspid regurgitation signal is inadequate for assessing  PA pressure.  3. The mitral valve is grossly normal. Mild mitral valve regurgitation.  No evidence of mitral stenosis.  4. The aortic valve is tricuspid. Aortic valve regurgitation is not  visualized. No aortic stenosis is present.  5. The  inferior vena cava is normal in size with greater than 50%  respiratory variability, suggesting right atrial pressure of 3 mmHg.  Recent Labs: 06/07/2020: ALT 14 06/16/2020: BUN 16; Creatinine, Ser 0.87; Hemoglobin 9.9; Magnesium 2.1; Platelets 363; Potassium 4.2; Sodium 136   Recent Lipid Panel    Component Value Date/Time   CHOL 157 01/16/2020 0840   TRIG 108 01/16/2020 0840   HDL 37 (L) 01/16/2020 0840   CHOLHDL 4.2 01/16/2020 0840   LDLCALC 100 (H) 01/16/2020 0840    Physical Exam:   VS:  There were no vitals taken for this visit.   Wt Readings from Last 3 Encounters:  06/06/20 96 lb 6.4 oz (43.7 kg)  02/27/20 106 lb (48.1 kg)  01/16/20 102 lb 6.4 oz (46.4 kg)    General: Well nourished, well developed, in no acute distress Head: Atraumatic, normal size  Eyes: PEERLA, EOMI  Neck: Supple, no JVD Endocrine: No thryomegaly Cardiac: Normal S1, S2; RRR; no murmurs, rubs, or gallops Lungs: Clear to auscultation bilaterally, no wheezing, rhonchi or rales  Abd: Soft, nontender, no hepatomegaly  Ext: No edema, pulses  2+ Musculoskeletal: No deformities, BUE and BLE strength normal and equal Skin: Warm and dry, no rashes   Neuro: Alert and oriented to person, place, time, and situation, CNII-XII grossly intact, no focal deficits  Psych: Normal mood and affect   ASSESSMENT:   Jonluc Sneath. is a 60 y.o. male who presents for the following: No diagnosis found.  PLAN:   There are no diagnoses linked to this encounter.  Disposition: No follow-ups on file.  Medication Adjustments/Labs and Tests Ordered: Current medicines are reviewed at length with the patient today.  Concerns regarding medicines are outlined above.  No orders of the defined types were placed in this encounter.  No orders of the defined types were placed in this encounter.   There are no Patient Instructions on file for this visit.   Time Spent with Patient: I have spent a total of *** minutes with  patient reviewing hospital notes, telemetry, EKGs, labs and examining the patient as well as establishing an assessment and plan that was discussed with the patient.  > 50% of time was spent in direct patient care.  Signed, Addison Naegeli. Audie Box, MD, [facility]  [address] [place], [place] [address] [phone]  07/02/2020 12:07 PM

## 2020-07-04 ENCOUNTER — Ambulatory Visit: Payer: Medicare Other | Admitting: Cardiovascular Disease

## 2020-07-08 ENCOUNTER — Encounter: Payer: Medicare Other | Admitting: Podiatry

## 2020-07-15 ENCOUNTER — Encounter: Payer: Medicare Other | Admitting: Podiatry

## 2020-07-21 ENCOUNTER — Encounter: Payer: Self-pay | Admitting: *Deleted

## 2020-07-21 ENCOUNTER — Other Ambulatory Visit: Payer: Self-pay

## 2020-07-21 ENCOUNTER — Ambulatory Visit (HOSPITAL_COMMUNITY)
Admission: RE | Admit: 2020-07-21 | Discharge: 2020-07-21 | Disposition: A | Discharge status: Home / Self Care | Payer: Medicare Other | Source: Ambulatory Visit | Attending: Surgery | Admitting: Surgery

## 2020-07-21 ENCOUNTER — Ambulatory Visit (INDEPENDENT_AMBULATORY_CARE_PROVIDER_SITE_OTHER)
Admission: RE | Admit: 2020-07-21 | Discharge: 2020-07-21 | Disposition: A | Discharge status: Home / Self Care | Payer: Medicare Other | Source: Ambulatory Visit | Attending: Surgery | Admitting: Surgery

## 2020-07-21 ENCOUNTER — Ambulatory Visit (INDEPENDENT_AMBULATORY_CARE_PROVIDER_SITE_OTHER): Payer: Medicare Other | Admitting: Physician Assistant

## 2020-07-21 VITALS — BP 127/76 | HR 68 | Temp 97.6°F | Resp 20 | Ht 61.0 in | Wt 101.2 lb

## 2020-07-21 DIAGNOSIS — I739 Peripheral vascular disease, unspecified: Secondary | ICD-10-CM

## 2020-07-21 DIAGNOSIS — I70229 Atherosclerosis of native arteries of extremities with rest pain, unspecified extremity: Secondary | ICD-10-CM | POA: Diagnosis present

## 2020-07-21 NOTE — Progress Notes (Signed)
HISTORY AND PHYSICAL     CC:  follow up. Requesting Provider:  Cipriano Mile, NP  HPI: This is a 60 y.o. male who is here today for follow up for PAD.  He has hx of right SFA arthrectomy and drug coated balloon angioplasty for chronic limb threatening ischemia in June 2021 by Dr. Gwenlyn Found.   He subsequently underwent attempted crossing of left SFA occlusion for claudication this could not be crossed.  On 06/07/2020, Dr. Donzetta Matters was specifically requested to evaluate pt who had ulceration of the left small toe that was painful.  He did have a wound on the dorsum of the right foot that was healing.    On 06/10/2020, he underwent aortogram with stenting of the left SFA and left CIA.  The right iliac system was widely patent as well as its associated stent.  The above knee popliteal artery was very small in caliber however remains patent as does the below knee popliteal artery.  The ATA is dominant runoff.   The RLE was not evaluated.    On 06/13/2020, he underwent amputation of the 5th toe on the left by Dr. Sherryle Lis.    The pt returns today for follow up non invasive studies.   He states that he still has some pain in his left foot but it is better than before stenting.  He states that his toe amp site is doing good.  He states that wearing his darco shoe helps with the pain.  He states that it hanging down also worsens the pain.  He has a sore on the dorsum of the right foot that he states is much better.   The pt is on a statin for cholesterol management.    The pt is on an aspirin.    Other AC:  Plavix The pt is on ACEI for hypertension.  The pt does not have diabetes. Tobacco hx:  current  Pt does not have family hx of AAA.  Past Medical History:  Diagnosis Date   Diabetes mellitus without complication ([place])    Hyperlipidemia    Hypertension    Peripheral vascular disease ([place])    Tobacco use     Past Surgical History:  Procedure Laterality Date   ABDOMINAL AORTOGRAM W/LOWER EXTREMITY  Left 07/16/2019   Procedure: ABDOMINAL AORTOGRAM W/LOWER EXTREMITY;  Surgeon: Lorretta Harp, MD;  Location: [facility];  Service: Cardiovascular;  Laterality: Left;   ABDOMINAL AORTOGRAM W/LOWER EXTREMITY Left 08/16/2019   ABDOMINAL AORTOGRAM W/LOWER EXTREMITY N/A 08/16/2019   Procedure: ABDOMINAL AORTOGRAM W/LOWER EXTREMITY;  Surgeon: Lorretta Harp, MD;  Location: [facility];  Service: Cardiovascular;  Laterality: N/A;   ABDOMINAL AORTOGRAM W/LOWER EXTREMITY Bilateral 12/19/2019   Procedure: ABDOMINAL AORTOGRAM W/LOWER EXTREMITY;  Surgeon: Wellington Hampshire, MD;  Location: [facility];  Service: Cardiovascular;  Laterality: Bilateral;   ABDOMINAL AORTOGRAM W/LOWER EXTREMITY Left 06/10/2020   Procedure: ABDOMINAL AORTOGRAM W/LOWER EXTREMITY;  Surgeon: Serafina Mitchell, MD;  Location: [facility];  Service: Cardiovascular;  Laterality: Left;   AMPUTATION TOE Left 06/13/2020   Procedure: Left Fifth Toe Amputation ;  Surgeon: Criselda Peaches, DPM;  Location: [facility];  Service: Podiatry;  Laterality: Left;   arm surgery     PERIPHERAL VASCULAR BALLOON ANGIOPLASTY  08/16/2019   Procedure: PERIPHERAL VASCULAR BALLOON ANGIOPLASTY;  Surgeon: Lorretta Harp, MD;  Location: [facility];  Service: Cardiovascular;;  attempted PTA of Left SFA   PERIPHERAL VASCULAR INTERVENTION  Right 07/16/2019   Procedure: PERIPHERAL VASCULAR INTERVENTION;  Surgeon: Lorretta Harp, MD;  Location: [facility];  Service: Cardiovascular;  Laterality: Right;   PERIPHERAL VASCULAR INTERVENTION Right 12/19/2019   Procedure: PERIPHERAL VASCULAR INTERVENTION;  Surgeon: Wellington Hampshire, MD;  Location: [facility];  Service: Cardiovascular;  Laterality: Right;  iliac   PERIPHERAL VASCULAR INTERVENTION Left 06/10/2020   Procedure: PERIPHERAL VASCULAR INTERVENTION;  Surgeon: Serafina Mitchell, MD;  Location: [facility];  Service: Cardiovascular;  Laterality: Left;  SFA  /COMMON ILIAC    No Known Allergies  Current Outpatient Medications  Medication Sig Dispense Refill   aspirin EC 81 MG EC tablet Take 1 tablet (81 mg total) by mouth daily. Swallow whole. (Patient taking differently: Take 81 mg by mouth daily at 2 PM. Swallow whole.) 90 tablet 3   atorvastatin (LIPITOR) 40 MG tablet Take 1 tablet by mouth at bedtime.     Blood Pressure Monitor KIT Check blood pressure daily (Patient taking differently: 1 each by Other route once a week.) 1 kit 0   clopidogrel (PLAVIX) 75 MG tablet Take 1 tablet (75 mg total) by mouth daily with breakfast. 90 tablet 3   docusate sodium (COLACE) 100 MG capsule Take 1 capsule (100 mg total) by mouth 2 (two) times daily. 10 capsule 0   gabapentin (NEURONTIN) 600 MG tablet Take 1 tablet (600 mg) in morning and afternoon. Take 2 tablets (1200 mg) at night 120 tablet 0   lisinopril-hydrochlorothiazide (ZESTORETIC) 20-25 MG tablet Take 1 tablet by mouth daily.     nicotine (NICODERM CQ - DOSED IN MG/24 HR) 7 mg/24hr patch Place 1 patch (7 mg total) onto the skin daily. 28 patch 0   oxyCODONE-acetaminophen (PERCOCET/ROXICET) 5-325 MG tablet Take 1-2 tablets by mouth every 6 (six) hours as needed for moderate pain. 30 tablet 0   SANTYL ointment Apply 1 application topically daily as needed (wound care).      SSD 1 % cream Apply 1 application topically daily as needed (wound care).      Current Facility-Administered Medications  Medication Dose Route Frequency Provider Last Rate Last Admin   sodium chloride flush (NS) 0.9 % injection 3 mL  3 mL Intravenous Q12H Lorretta Harp, MD        Family History  Problem Relation Age of Onset   Cancer Mother    Diabetes Father     Social History   Socioeconomic History   Marital status: Legally Separated    Spouse name: Not on file   Number of children: Not on file   Years of education: Not on file   Highest education level: Not on file  Occupational History   Not on file   Tobacco Use   Smoking status: Every Day    Packs/day: 0.50    Years: 40.00    Pack years: 20.00    Types: Cigarettes   Smokeless tobacco: Never  Vaping Use   Vaping Use: Never used  Substance and Sexual Activity   Alcohol use: Yes    Comment: beer and liquor daily   Drug use: Yes    Frequency: 3.0 times per week    Types: Marijuana   Sexual activity: Not on file  Other Topics Concern   Not on file  Social History Narrative   Not on file   Social Determinants of Health   Financial Resource Strain: Not on file  Food Insecurity: Not on file  Transportation Needs: Not  on file  Physical Activity: Not on file  Stress: Not on file  Social Connections: Not on file  Intimate Partner Violence: Not on file     REVIEW OF SYSTEMS:   _0  denotes positive finding, _1  denotes negative finding Cardiac  Comments:  Chest pain or chest pressure:    Shortness of breath upon exertion:    Short of breath when lying flat:    Irregular heart rhythm:        Vascular    Pain in calf, thigh, or hip brought on by ambulation:    Pain in feet at night that wakes you up from your sleep:  x   Blood clot in your veins:    Leg swelling:         Pulmonary    Oxygen at home:    Productive cough:     Wheezing:         Neurologic    Sudden weakness in arms or legs:     Sudden numbness in arms or legs:     Sudden onset of difficulty speaking or slurred speech:    Temporary loss of vision in one eye:     Problems with dizziness:         Gastrointestinal    Blood in stool:     Vomited blood:         Genitourinary    Burning when urinating:     Blood in urine:        Psychiatric    Major depression:         Hematologic    Bleeding problems:    Problems with blood clotting too easily:        Skin    Rashes or ulcers:        Constitutional    Fever or chills:      PHYSICAL EXAMINATION:  Today's Vitals   07/21/20 1142  BP: 127/76  Pulse: 68  Resp: 20  Temp: 97.6 F (36.4  C)  TempSrc: Temporal  SpO2: 100%  Weight: 101 lb 3.2 oz (45.9 kg)  Height: _2  (1.549 m)  PainSc: 3   PainLoc: Foot   Body mass index is 19.12 kg/m.   General:  WDWN in NAD; vital signs documented above Gait: Not observed HENT: WNL, normocephalic Pulmonary: normal non-labored breathing , without wheezing Cardiac: regular HR, without  Murmur; without carotid bruits Abdomen: soft, NT, no masses; aortic pulse is not palpable Skin: without rashes Vascular Exam/Pulses:  Right Left  Radial 2+ (normal) 2+ (normal)  Femoral 2+ (normal) 2+ (normal)  Popliteal Unable to palpate Unable to palpate  DP Brisk biphasic Brisk biphasic  PT Brisk biphasic Brisk biphasic   Extremities: toe amputation (left 5th toe) healing nicely; small dry area on dorsum of right foot almost healed.  Musculoskeletal: no muscle wasting or atrophy  Neurologic: A&O X 3;  No focal weakness or paresthesias are detected Psychiatric:  The pt has Normal affect.   Non-Invasive Vascular Imaging:   ABI's/TBI's on 07/21/2020: Right:  0.69/0.42 - Great toe pressure: 52 Left:  0.59/0.49 - Great toe pressure: 61  Arterial duplex on 07/21/2020: +----------+--------+-----+---------------+--------+--------+  LEFT      PSV cm/sRatioStenosis       WaveformComments  +----------+--------+-----+---------------+--------+--------+  CFA Prox  254          50-74% stenosis                  +----------+--------+-----+---------------+--------+--------+  CFA Mid   336  50-74% stenosis                  +----------+--------+-----+---------------+--------+--------+  CFA Distal246          50-74% stenosis                  +----------+--------+-----+---------------+--------+--------+  POP Prox  88                                            +----------+--------+-----+---------------+--------+--------+  POP Distal69                                             +----------+--------+-----+---------------+--------+--------+      Left Stent(s): proximal to distal SFA  +---------------+---+--------------+----------++  Prox to Stent  113              monophasic  +---------------+---+--------------+----------++  Proximal Stent 125              monophasic  +---------------+---+--------------+----------++  Mid Stent      37               monophasic  +---------------+---+--------------+----------++  Distal Stent   1351-49% stenosismonophasic  +---------------+---+--------------+----------++  Distal to Stent65               monophasic  +---------------+---+--------------+----------++  Summary:  Left: Increased velocities noted in the distal portion of the stent; however, not hemodynamically significant.  Mid common femoral artery velocities suggest 50-74% stenosis.  Common iliac artery stent velocities suggest >50% stenosis.   Previous ABI's/TBI's on 06/05/2020: Right:  1.07/0.35 Great toe pressure 49 Left:  0.41/0.33 great toe pressure 46   ASSESSMENT/PLAN:: 60 y.o. male here for follow up for hx of right SFA arthrectomy and drug coated balloon angioplasty for chronic limb threatening ischemia in June 2021 by Dr. Gwenlyn Found.   He subsequently underwent attempted crossing of left SFA occlusion for claudication this could not be crossed.  On 06/10/2020, he underwent aortogram with stenting of the left SFA and left CIA.  He had subsequent left 5th toe amputation by Dr. Sherryle Lis.    -pt has brisk biphasic DP/PT doppler signals bilaterally.  His ABI is improved from before intervention.  He does have some pain in his left foot but this is also improved and does not correlate with classic rest pain since it is worse in the dependent position.    -continue asa/statin/plavix -pt will f/u in 3 months with BLE arterial duplex and ABI given he has hx of RLE interventions as well.  He will call sooner if he has any issues before then.    -he has not followed up with Dr. McDonald-recommended he make appt to have his toe nails trimmed and wound check from toe amp.   -discussed importance of smoking cessation   Leontine Locket, [facility] [phone]  Clinic MD:   Trula Slade

## 2020-07-22 ENCOUNTER — Other Ambulatory Visit: Payer: Self-pay

## 2020-07-22 DIAGNOSIS — I70229 Atherosclerosis of native arteries of extremities with rest pain, unspecified extremity: Secondary | ICD-10-CM

## 2020-07-24 ENCOUNTER — Telehealth: Payer: Self-pay | Admitting: Podiatry

## 2020-07-24 NOTE — Telephone Encounter (Signed)
[name] from [facility] called stating the wound care orders may need to be changed. She is requesting for either you or your nurse to contact her to discuss further treatment and care. Please advise.

## 2020-07-24 NOTE — Telephone Encounter (Signed)
I haven't seen him since surgery, he needs to come in so I can evaluate to know what wound care orders to give

## 2020-07-29 ENCOUNTER — Telehealth: Payer: Self-pay | Admitting: *Deleted

## 2020-07-29 NOTE — Telephone Encounter (Signed)
Returned call to nurse, no answer and could not leave vmessage but patient's appointment has been scheduled.

## 2020-07-29 NOTE — Telephone Encounter (Signed)
Nurse w/ [facility] is calling for an order to remove a couple of stitches remaining. Please advise. The patient is also needing an appointment to have his foot examined.Please scheduled.

## 2020-08-07 ENCOUNTER — Encounter: Payer: Medicare Other | Admitting: Podiatry

## 2020-08-11 ENCOUNTER — Encounter: Payer: Medicare Other | Admitting: Podiatry

## 2020-09-25 ENCOUNTER — Ambulatory Visit (INDEPENDENT_AMBULATORY_CARE_PROVIDER_SITE_OTHER): Payer: 59 | Admitting: Podiatry

## 2020-09-25 ENCOUNTER — Other Ambulatory Visit: Payer: Self-pay

## 2020-09-25 DIAGNOSIS — Z89422 Acquired absence of other left toe(s): Secondary | ICD-10-CM

## 2020-09-25 DIAGNOSIS — M79675 Pain in left toe(s): Secondary | ICD-10-CM

## 2020-09-25 DIAGNOSIS — E119 Type 2 diabetes mellitus without complications: Secondary | ICD-10-CM

## 2020-09-25 DIAGNOSIS — M79674 Pain in right toe(s): Secondary | ICD-10-CM

## 2020-09-25 DIAGNOSIS — B351 Tinea unguium: Secondary | ICD-10-CM

## 2020-09-25 DIAGNOSIS — I739 Peripheral vascular disease, unspecified: Secondary | ICD-10-CM | POA: Diagnosis not present

## 2020-09-30 NOTE — Progress Notes (Signed)
  Subjective:  Patient ID: Ruben Beams., male    DOB: 05-Jan-1961,  MRN: [id number]  Chief Complaint  Patient presents with   Toe Pain    left foot pinky toe amputation-having some pain    60 y.o. male presents with the above complaint. History confirmed with patient.  This is the first time I have seen him since surgery.  He says the nurse took his sutures out a few months ago.  He overall he is doing well.  His nails are thickened and elongated.  Objective:  Physical Exam: warm, good capillary refill, no trophic changes or ulcerative lesions, and normal DP and PT pulses. Left Foot: dystrophic yellowed discolored nail plates with subungual debris and well-healed amputation site fifth toe Right Foot: dystrophic yellowed discolored nail plates with subungual debris  Assessment:   1. Pain due to onychomycosis of toenails of both feet   2. PVD (peripheral vascular disease) ([place])   3. Type 2 diabetes mellitus without complication, without long-term current use of insulin ([place])   4. History of amputation of lesser toe of left foot ([place])      Plan:  Patient was evaluated and treated and all questions answered.  Patient educated on diabetes. Discussed proper diabetic foot care and discussed risks and complications of disease. Educated patient in depth on reasons to return to the office immediately should he/she discover anything concerning or new on the feet. All questions answered. Discussed proper shoes as well.   Discussed the etiology and treatment options for the condition in detail with the patient. Educated patient on the topical and oral treatment options for mycotic nails. Recommended debridement of the nails today. Sharp and mechanical debridement performed of all painful and mycotic nails today. Nails debrided in length and thickness using a nail nipper to level of comfort. Discussed treatment options including appropriate shoe gear. Follow up as needed for painful  nails.    Return in about 3 months (around 12/25/2020) for at risk foot care.

## 2020-10-05 ENCOUNTER — Encounter (HOSPITAL_COMMUNITY): Payer: Self-pay | Admitting: Emergency Medicine

## 2020-10-05 ENCOUNTER — Inpatient Hospital Stay (HOSPITAL_COMMUNITY)
Admission: EM | Admit: 2020-10-05 | Discharge: 2020-10-15 | DRG: 270 | Disposition: A | Discharge status: Home Health | Payer: Medicare HMO | Attending: Internal Medicine | Admitting: Internal Medicine

## 2020-10-05 ENCOUNTER — Other Ambulatory Visit: Payer: Self-pay

## 2020-10-05 ENCOUNTER — Emergency Department (HOSPITAL_COMMUNITY): Payer: Medicare HMO

## 2020-10-05 DIAGNOSIS — M79604 Pain in right leg: Secondary | ICD-10-CM | POA: Diagnosis present

## 2020-10-05 DIAGNOSIS — Z95820 Peripheral vascular angioplasty status with implants and grafts: Secondary | ICD-10-CM | POA: Diagnosis not present

## 2020-10-05 DIAGNOSIS — T82898A Other specified complication of vascular prosthetic devices, implants and grafts, initial encounter: Principal | ICD-10-CM | POA: Diagnosis present

## 2020-10-05 DIAGNOSIS — E782 Mixed hyperlipidemia: Secondary | ICD-10-CM | POA: Diagnosis present

## 2020-10-05 DIAGNOSIS — L97529 Non-pressure chronic ulcer of other part of left foot with unspecified severity: Secondary | ICD-10-CM | POA: Diagnosis present

## 2020-10-05 DIAGNOSIS — E43 Unspecified severe protein-calorie malnutrition: Secondary | ICD-10-CM | POA: Diagnosis present

## 2020-10-05 DIAGNOSIS — Z9889 Other specified postprocedural states: Secondary | ICD-10-CM

## 2020-10-05 DIAGNOSIS — E1142 Type 2 diabetes mellitus with diabetic polyneuropathy: Secondary | ICD-10-CM | POA: Diagnosis present

## 2020-10-05 DIAGNOSIS — F17210 Nicotine dependence, cigarettes, uncomplicated: Secondary | ICD-10-CM | POA: Diagnosis not present

## 2020-10-05 DIAGNOSIS — Z681 Body mass index (BMI) 19 or less, adult: Secondary | ICD-10-CM | POA: Diagnosis not present

## 2020-10-05 DIAGNOSIS — Z89422 Acquired absence of other left toe(s): Secondary | ICD-10-CM | POA: Diagnosis not present

## 2020-10-05 DIAGNOSIS — T502X5A Adverse effect of carbonic-anhydrase inhibitors, benzothiadiazides and other diuretics, initial encounter: Secondary | ICD-10-CM | POA: Diagnosis not present

## 2020-10-05 DIAGNOSIS — Z20822 Contact with and (suspected) exposure to covid-19: Secondary | ICD-10-CM | POA: Diagnosis present

## 2020-10-05 DIAGNOSIS — I70222 Atherosclerosis of native arteries of extremities with rest pain, left leg: Secondary | ICD-10-CM | POA: Diagnosis present

## 2020-10-05 DIAGNOSIS — Z79899 Other long term (current) drug therapy: Secondary | ICD-10-CM

## 2020-10-05 DIAGNOSIS — M79675 Pain in left toe(s): Secondary | ICD-10-CM | POA: Diagnosis present

## 2020-10-05 DIAGNOSIS — R109 Unspecified abdominal pain: Secondary | ICD-10-CM | POA: Diagnosis not present

## 2020-10-05 DIAGNOSIS — Z7902 Long term (current) use of antithrombotics/antiplatelets: Secondary | ICD-10-CM | POA: Diagnosis not present

## 2020-10-05 DIAGNOSIS — I700 Atherosclerosis of aorta: Secondary | ICD-10-CM | POA: Diagnosis present

## 2020-10-05 DIAGNOSIS — E872 Acidosis: Secondary | ICD-10-CM | POA: Diagnosis present

## 2020-10-05 DIAGNOSIS — E871 Hypo-osmolality and hyponatremia: Secondary | ICD-10-CM | POA: Diagnosis not present

## 2020-10-05 DIAGNOSIS — Z7982 Long term (current) use of aspirin: Secondary | ICD-10-CM | POA: Diagnosis not present

## 2020-10-05 DIAGNOSIS — I70229 Atherosclerosis of native arteries of extremities with rest pain, unspecified extremity: Secondary | ICD-10-CM | POA: Diagnosis present

## 2020-10-05 DIAGNOSIS — Z833 Family history of diabetes mellitus: Secondary | ICD-10-CM

## 2020-10-05 DIAGNOSIS — E119 Type 2 diabetes mellitus without complications: Secondary | ICD-10-CM

## 2020-10-05 DIAGNOSIS — E1151 Type 2 diabetes mellitus with diabetic peripheral angiopathy without gangrene: Secondary | ICD-10-CM | POA: Diagnosis present

## 2020-10-05 DIAGNOSIS — Z01810 Encounter for preprocedural cardiovascular examination: Secondary | ICD-10-CM | POA: Diagnosis not present

## 2020-10-05 DIAGNOSIS — I10 Essential (primary) hypertension: Secondary | ICD-10-CM | POA: Diagnosis present

## 2020-10-05 DIAGNOSIS — D649 Anemia, unspecified: Secondary | ICD-10-CM | POA: Diagnosis not present

## 2020-10-05 DIAGNOSIS — R195 Other fecal abnormalities: Secondary | ICD-10-CM | POA: Diagnosis not present

## 2020-10-05 DIAGNOSIS — T82856A Stenosis of peripheral vascular stent, initial encounter: Secondary | ICD-10-CM | POA: Diagnosis not present

## 2020-10-05 DIAGNOSIS — Z01818 Encounter for other preprocedural examination: Secondary | ICD-10-CM | POA: Diagnosis not present

## 2020-10-05 LAB — PROTIME-INR
INR: 1.1 (ref 0.8–1.2)
Prothrombin Time: 13.7 seconds (ref 11.4–15.2)

## 2020-10-05 LAB — CBC WITH DIFFERENTIAL/PLATELET
Abs Immature Granulocytes: 0.02 10*3/uL (ref 0.00–0.07)
Basophils Absolute: 0 10*3/uL (ref 0.0–0.1)
Basophils Relative: 0 %
Eosinophils Absolute: 0.1 10*3/uL (ref 0.0–0.5)
Eosinophils Relative: 1 %
HCT: 46.1 % (ref 39.0–52.0)
Hemoglobin: 15.1 g/dL (ref 13.0–17.0)
Immature Granulocytes: 0 %
Lymphocytes Relative: 31 %
Lymphs Abs: 1.6 10*3/uL (ref 0.7–4.0)
MCH: 29.5 pg (ref 26.0–34.0)
MCHC: 32.8 g/dL (ref 30.0–36.0)
MCV: 90 fL (ref 80.0–100.0)
Monocytes Absolute: 0.8 10*3/uL (ref 0.1–1.0)
Monocytes Relative: 15 %
Neutro Abs: 2.7 10*3/uL (ref 1.7–7.7)
Neutrophils Relative %: 53 %
Platelets: 212 10*3/uL (ref 150–400)
RBC: 5.12 MIL/uL (ref 4.22–5.81)
RDW: 16.8 % — ABNORMAL HIGH (ref 11.5–15.5)
WBC: 5.1 10*3/uL (ref 4.0–10.5)
nRBC: 0 % (ref 0.0–0.2)

## 2020-10-05 LAB — BASIC METABOLIC PANEL
Anion gap: 13 (ref 5–15)
BUN: 18 mg/dL (ref 8–23)
CO2: 23 mmol/L (ref 22–32)
Calcium: 10.2 mg/dL (ref 8.9–10.3)
Chloride: 101 mmol/L (ref 98–111)
Creatinine, Ser: 1.17 mg/dL (ref 0.61–1.24)
GFR, Estimated: >60 mL/min (ref >60)
Glucose, Bld: 86 mg/dL (ref 70–99)
Potassium: 3.7 mmol/L (ref 3.5–5.1)
Sodium: 137 mmol/L (ref 135–145)

## 2020-10-05 LAB — C-REACTIVE PROTEIN: CRP: 1.4 mg/dL — ABNORMAL HIGH (ref <1.0)

## 2020-10-05 LAB — APTT: aPTT: 30 seconds (ref 24–36)

## 2020-10-05 LAB — SEDIMENTATION RATE: Sed Rate: 47 mm/hr — ABNORMAL HIGH (ref 0–16)

## 2020-10-05 LAB — LACTIC ACID, PLASMA
Lactic Acid, Venous: 2.6 mmol/L (ref 0.5–1.9)
Lactic Acid, Venous: 2.2 mmol/L (ref 0.5–1.9)

## 2020-10-05 LAB — RESP PANEL BY RT-PCR (FLU A&B, COVID) ARPGX2
Influenza A by PCR: NEGATIVE
Influenza B by PCR: NEGATIVE
SARS Coronavirus 2 by RT PCR: NEGATIVE

## 2020-10-05 MED ORDER — POLYETHYLENE GLYCOL 3350 17 G PO PACK: 17.0000 g | PACK | Freq: Every day | ORAL | Status: DC | PRN

## 2020-10-05 MED ORDER — ATORVASTATIN CALCIUM 40 MG PO TABS
40.0000 mg | ORAL_TABLET | Freq: Every day | ORAL | Status: DC
  Administered 2020-10-06 – 2020-10-07 (×2): 40 mg via ORAL
  Filled 2020-10-05 (×2): qty 1

## 2020-10-05 MED ORDER — ASPIRIN EC 81 MG PO TBEC
81.0000 mg | DELAYED_RELEASE_TABLET | Freq: Every day | ORAL | Status: DC
  Administered 2020-10-06 – 2020-10-08 (×3): 81 mg via ORAL
  Filled 2020-10-05 (×3): qty 1

## 2020-10-05 MED ORDER — IOHEXOL 350 MG/ML SOLN
100.0000 mL | Freq: Once | INTRAVENOUS | Status: AC | PRN
  Administered 2020-10-05: 100 mL via INTRAVENOUS

## 2020-10-05 MED ORDER — LISINOPRIL-HYDROCHLOROTHIAZIDE 20-25 MG PO TABS: 1.0000 | ORAL_TABLET | Freq: Every day | ORAL | Status: DC

## 2020-10-05 MED ORDER — HEPARIN BOLUS VIA INFUSION
2000.0000 [IU] | Freq: Once | INTRAVENOUS | Status: AC
  Administered 2020-10-05: 2000 [IU] via INTRAVENOUS
  Filled 2020-10-05: qty 2000

## 2020-10-05 MED ORDER — ACETAMINOPHEN 650 MG RE SUPP: 650.0000 mg | Freq: Four times a day (QID) | RECTAL | Status: DC | PRN

## 2020-10-05 MED ORDER — HYDROMORPHONE HCL 1 MG/ML IJ SOLN
0.5000 mg | INTRAMUSCULAR | Status: DC | PRN
  Administered 2020-10-05: 0.5 mg via INTRAVENOUS
  Filled 2020-10-05: qty 1

## 2020-10-05 MED ORDER — ONDANSETRON HCL 4 MG PO TABS: 4.0000 mg | ORAL_TABLET | Freq: Four times a day (QID) | ORAL | Status: DC | PRN

## 2020-10-05 MED ORDER — HEPARIN (PORCINE) 25000 UT/250ML-% IV SOLN
850.0000 [IU]/h | INTRAVENOUS | Status: DC
  Administered 2020-10-05: 950 [IU]/h via INTRAVENOUS
  Filled 2020-10-05: qty 250

## 2020-10-05 MED ORDER — MELATONIN 5 MG PO TABS
10.0000 mg | ORAL_TABLET | Freq: Every evening | ORAL | Status: DC | PRN
Start: 2020-10-05 — End: 2020-10-09

## 2020-10-05 MED ORDER — FENTANYL CITRATE PF 50 MCG/ML IJ SOSY
50.0000 ug | PREFILLED_SYRINGE | Freq: Once | INTRAMUSCULAR | Status: AC
  Administered 2020-10-05: 50 ug via INTRAVENOUS
  Filled 2020-10-05: qty 1

## 2020-10-05 MED ORDER — HYDROMORPHONE HCL 1 MG/ML IJ SOLN
1.0000 mg | INTRAMUSCULAR | Status: DC | PRN
Start: 2020-10-05 — End: 2020-10-09
  Administered 2020-10-06 – 2020-10-07 (×9): 1 mg via INTRAVENOUS
  Filled 2020-10-05 (×9): qty 1

## 2020-10-05 MED ORDER — ACETAMINOPHEN 325 MG PO TABS: 650.0000 mg | ORAL_TABLET | Freq: Four times a day (QID) | ORAL | Status: DC | PRN

## 2020-10-05 MED ORDER — LISINOPRIL 10 MG PO TABS
20.0000 mg | ORAL_TABLET | Freq: Every day | ORAL | Status: DC
  Administered 2020-10-06 – 2020-10-08 (×3): 20 mg via ORAL
  Filled 2020-10-05 (×3): qty 2

## 2020-10-05 MED ORDER — LACTATED RINGERS IV SOLN: INTRAVENOUS | Status: AC

## 2020-10-05 MED ORDER — ONDANSETRON HCL 4 MG/2ML IJ SOLN: 4.0000 mg | Freq: Four times a day (QID) | INTRAMUSCULAR | Status: DC | PRN

## 2020-10-05 MED ORDER — HYDROCHLOROTHIAZIDE 25 MG PO TABS
25.0000 mg | ORAL_TABLET | Freq: Every day | ORAL | Status: DC
  Administered 2020-10-06 – 2020-10-08 (×3): 25 mg via ORAL
  Filled 2020-10-05 (×3): qty 1

## 2020-10-05 NOTE — ED Provider Notes (Signed)
Emergency Medicine Provider Triage Evaluation Note  [name]. , a 60 y.o. male  was evaluated in triage.  Pt complains of left foot pain and erythema that have been present for the past few days.  Patient had his left fifth toe amputated in May.  Patient saw podiatry last on 09/25/2020.  No fever or chills.  No recent injury.  Review of Systems  Positive: Color change, arthralgia Negative: fever  Physical Exam  BP (!) 145/100   Pulse (!) 107   Temp 98.8 F (37.1 C)   Resp 18   Ht '5\' 1"'$  (1.549 m)   Wt 46 kg   SpO2 100%   BMI 19.16 kg/m  Gen:   Awake, no distress   Resp:  Normal effort  MSK:   Moves extremities without difficulty  Other:  Amputated left 5th toe with some erythema  Medical Decision Making  Medically screening exam initiated at 4:50 PM.  Appropriate orders placed.  [employer]. was informed that the remainder of the evaluation will be completed by another provider, this initial triage assessment does not replace that evaluation, and the importance of remaining in the ED until their evaluation is complete.  Labs, blood cultures X-ray to rule out signs of osteomyelitis   Karie Kirks 10/05/20 1651    [place], [place], MD 10/05/20 [phone]

## 2020-10-05 NOTE — Consult Note (Addendum)
[facility] [place]  ASSESSMENT / PLAN: [name]. is a 60 y.o. 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  Patient counseled patients with chronic limb threatening ischemia have an annual risk of cardiovascular mortality of 25% and a high risk of amputation.   Recommend the following which can slow the progression of atherosclerosis and reduce the risk of major adverse cardiac / limb events:  Complete cessation from all tobacco products. Blood glucose control with goal A1c < 7%. Blood pressure control with goal blood pressure < 140/90 mmHg. Lipid reduction therapy with goal LDL-C <100 mg/dL (<70 if symptomatic from PAD).  Aspirin 24m PO QD.  Atorvastatin 40-80mg PO QD (or other "high intensity" statin therapy).  Recommend admission to internal medicine.  Plan left lower extremity lower extremity angiogram with possible intervention via right common femoral artery approach in cath lab tomorrow 10/06/2020 with Dr. CDonzetta Matters   CHIEF COMPLAINT: Left leg pain  HISTORY OF PRESENT ILLNESS: [name] is a 60y.o. male well-known to our service with who presents the ER today with a 1 week history of left foot pain.  The patient reports the pain is located in the ball of his foot.  The pain is associated with paresthesias and numbness.  Patient is able to move the foot.  Patient underwent an endovascular revascularization 06/10/2020 for ischemic ulceration of the left fifth toe.  He subsequently had a toe amputation by Dr. MSherryle Lisof podiatry.  The patient followed up in our clinic on 07/21/2020 with fairly stable symptoms.  He reports a slow decline in ability to ambulate since that time.  VASCULAR SURGICAL HISTORY:  Left SFA and popliteal artery stenting, left common iliac artery stenting 06/10/2020 with Dr. BTrula Slade VASCULAR RISK FACTORS: Negative history of stroke / transient ischemic  attack. Negative history of coronary artery disease.  Patient denies diabetes mellitus. Last A1c 6.3. Positive history of smoking. + actively smoking. Positive history of hypertension.  Negative history of chronic kidney disease.  Last GFR greater than 60.  Negative history of chronic obstructive pulmonary disease.  FUNCTIONAL STATUS: ECOG performance status: (0) Fully active, able to carry on all predisease performance without restriction Ambulatory status: Ambulatory within the community without limits  Past Medical History:  Diagnosis Date   Diabetes mellitus without complication ([facility]    Hyperlipidemia    Hypertension    Peripheral vascular disease ([facility]    Tobacco use     Past Surgical History:  Procedure Laterality Date   ABDOMINAL AORTOGRAM W/LOWER EXTREMITY Left 07/16/2019   Procedure: ABDOMINAL AORTOGRAM W/LOWER EXTREMITY;  Surgeon: BLorretta Harp MD;  [facility];  Service: Cardiovascular;  Laterality: Left;   ABDOMINAL AORTOGRAM W/LOWER EXTREMITY Left 08/16/2019   ABDOMINAL AORTOGRAM W/LOWER EXTREMITY N/A 08/16/2019   Procedure: ABDOMINAL AORTOGRAM W/LOWER EXTREMITY;  Surgeon: BLorretta Harp MD;  [facility];  Service: Cardiovascular;  Laterality: N/A;   ABDOMINAL AORTOGRAM W/LOWER EXTREMITY Bilateral 12/19/2019   Procedure: ABDOMINAL AORTOGRAM W/LOWER EXTREMITY;  Surgeon: AWellington Hampshire MD;  [facility];  Service: Cardiovascular;  Laterality: Bilateral;   ABDOMINAL AORTOGRAM W/LOWER EXTREMITY Left 06/10/2020   Procedure: ABDOMINAL AORTOGRAM W/LOWER EXTREMITY;  Surgeon: BSerafina Mitchell MD;  [facility];  Service: Cardiovascular;  Laterality: Left;   AMPUTATION TOE Left 06/13/2020   Procedure: Left Fifth Toe Amputation ;  Surgeon: MCriselda Peaches DPM;  Location: [facility];  Service: Podiatry;  Laterality: Left;   arm surgery     PERIPHERAL VASCULAR BALLOON ANGIOPLASTY  08/16/2019   Procedure:  PERIPHERAL VASCULAR BALLOON ANGIOPLASTY;  Surgeon: Lorretta Harp, MD;  Location: [facility];  Service: Cardiovascular;;  attempted PTA of Left SFA   PERIPHERAL VASCULAR INTERVENTION Right 07/16/2019   Procedure: PERIPHERAL VASCULAR INTERVENTION;  Surgeon: Lorretta Harp, MD;  Location: [facility];  Service: Cardiovascular;  Laterality: Right;   PERIPHERAL VASCULAR INTERVENTION Right 12/19/2019   Procedure: PERIPHERAL VASCULAR INTERVENTION;  Surgeon: Wellington Hampshire, MD;  Location: [facility];  Service: Cardiovascular;  Laterality: Right;  iliac   PERIPHERAL VASCULAR INTERVENTION Left 06/10/2020   Procedure: PERIPHERAL VASCULAR INTERVENTION;  Surgeon: Serafina Mitchell, MD;  Location: [facility];  Service: Cardiovascular;  Laterality: Left;  SFA /COMMON ILIAC    Family History  Problem Relation Age of Onset   Cancer Mother    Diabetes Father     Social History   Socioeconomic History   Marital status: Legally Separated    Spouse name: Not on file   Number of children: Not on file   Years of education: Not on file   Highest education level: Not on file  Occupational History   Not on file  Tobacco Use   Smoking status: Every Day    Packs/day: 0.50    Years: 40.00    Pack years: 20.00    Types: Cigarettes   Smokeless tobacco: Never  Vaping Use   Vaping Use: Never used  Substance and Sexual Activity   Alcohol use: Yes    Comment: beer and liquor daily   Drug use: Yes    Frequency: 3.0 times per week    Types: Marijuana   Sexual activity: Not on file  Other Topics Concern   Not on file  Social History Narrative   Not on file   Social Determinants of Health   Financial Resource Strain: Not on file  Food Insecurity: Not on file  Transportation Needs: Not on file  Physical Activity: Not on file  Stress: Not on file  Social Connections: Not on file  Intimate Partner Violence: Not on file    No Known Allergies  Current  Facility-Administered Medications  Medication Dose Route Frequency Provider Last Rate Last Admin   heparin ADULT infusion 100 units/mL (25000 units/278m)  950 Units/hr Intravenous Continuous Shade, Christine E, RPH 9.5 mL/hr at 10/05/20 2047 950 Units/hr at 10/05/20 2047   sodium chloride flush (NS) 0.9 % injection 3 mL  3 mL Intravenous Q12H BLorretta Harp MD       Current Outpatient Medications  Medication Sig Dispense Refill   aspirin EC 81 MG EC tablet Take 1 tablet (81 mg total) by mouth daily. Swallow whole. (Patient taking differently: Take 81 mg by mouth daily at 2 PM. Swallow whole.) 90 tablet 3   atorvastatin (LIPITOR) 40 MG tablet Take 40 mg by mouth daily.     clopidogrel (PLAVIX) 75 MG tablet Take 1 tablet (75 mg total) by mouth daily with breakfast. 90 tablet 3   gabapentin (NEURONTIN) 600 MG tablet Take 1 tablet (600 mg) in morning and afternoon. Take 2 tablets (1200 mg) at night (Patient taking differently: Take 600 mg by mouth See admin instructions. Take 3 tablet (1800 mg) in morning and  2 tablets (1200 mg) at night) 120 tablet 0   lisinopril-hydrochlorothiazide (ZESTORETIC) 20-25 MG tablet Take 1 tablet by  mouth daily.     SANTYL ointment Apply 1 application topically 2 (two) times daily.     traMADol (ULTRAM) 50 MG tablet Take 50 mg by mouth every 6 (six) hours as needed for moderate pain.     Blood Pressure Monitor KIT Check blood pressure daily (Patient taking differently: 1 each by Other route once a week.) 1 kit 0   docusate sodium (COLACE) 100 MG capsule Take 1 capsule (100 mg total) by mouth 2 (two) times daily. (Patient not taking: No sig reported) 10 capsule 0   nicotine (NICODERM CQ - DOSED IN MG/24 HR) 7 mg/24hr patch Place 1 patch (7 mg total) onto the skin daily. (Patient not taking: No sig reported) 28 patch 0   oxyCODONE-acetaminophen (PERCOCET/ROXICET) 5-325 MG tablet Take 1-2 tablets by mouth every 6 (six) hours as needed for moderate pain. (Patient not  taking: No sig reported) 30 tablet 0    REVIEW OF SYSTEMS:  _0  denotes positive finding, _1  denotes negative finding Cardiac  Comments:  Chest pain or chest pressure:    Shortness of breath upon exertion:    Short of breath when lying flat:    Irregular heart rhythm:        Vascular    Pain in calf, thigh, or hip brought on by ambulation:    Pain in feet at night that wakes you up from your sleep:     Blood clot in your veins:    Leg swelling:         Pulmonary    Oxygen at home:    Productive cough:     Wheezing:         Neurologic    Sudden weakness in arms or legs:     Sudden numbness in arms or legs:     Sudden onset of difficulty speaking or slurred speech:    Temporary loss of vision in one eye:     Problems with dizziness:         Gastrointestinal    Blood in stool:     Vomited blood:         Genitourinary    Burning when urinating:     Blood in urine:        Psychiatric    Major depression:         Hematologic    Bleeding problems:    Problems with blood clotting too easily:        Skin    Rashes or ulcers:        Constitutional    Fever or chills:      PHYSICAL EXAM Vitals:   10/05/20 1843 10/05/20 1946 10/05/20 2032 10/05/20 2100  BP: (!) 146/95 (!) 151/97 (!) 146/82 (!) 140/91  Pulse: (!) 111 97 84 100  Resp: _2 Temp:      SpO2: 100% 100% 100% 100%  Weight:      Height:        Constitutional: Chronically ill appearing. No distress. Appears marginally nourished.  Neurologic: CN intact. no focal findings.  Normal motor and sensory function in the left foot. Psychiatric:  Mood and affect symmetric and appropriate. Eyes: muddly sclera. No conjunctival pallor. Ears, nose, throat:  mucous membranes moist. Midline trachea.  Cardiac: regular rate and rhythm.  Respiratory:  unlabored. Abdominal:  soft, non-tender, non-distended.  Peripheral vascular: 2+ right common femoral artery pulse. No palpable left common femoral artery  pulse Weak monophasic Doppler flow in the left  anterior tibial artery Left fifth toe surgically absent.  Eschar on surgical scar. Extremity: no edema. no cyanosis. no pallor.  Skin: no gangrene. no ulceration.  Lymphatic: no Stemmer's sign. no palpable lymphadenopathy.  PERTINENT LABORATORY AND RADIOLOGIC DATA  Most recent CBC CBC Latest Ref Rng & Units 10/05/2020 06/16/2020 06/14/2020  WBC 4.0 - 10.5 K/uL 5.1 6.3 6.8  Hemoglobin 13.0 - 17.0 g/dL 15.1 9.9(L) 9.5(L)  Hematocrit 39.0 - 52.0 % 46.1 31.7(L) 29.7(L)  Platelets 150 - 400 K/uL 212 363 280     Most recent CMP CMP Latest Ref Rng & Units 10/05/2020 06/16/2020 06/15/2020  Glucose 70 - 99 mg/dL 86 94 -  BUN 8 - 23 mg/dL 18 16 -  Creatinine 0.61 - 1.24 mg/dL 1.17 0.87 0.89  Sodium 135 - 145 mmol/L 137 136 -  Potassium 3.5 - 5.1 mmol/L 3.7 4.2 -  Chloride 98 - 111 mmol/L 101 104 -  CO2 22 - 32 mmol/L 23 27 -  Calcium 8.9 - 10.3 mg/dL 10.2 9.0 -  Total Protein 6.5 - 8.1 g/dL - - -  Total Bilirubin 0.3 - 1.2 mg/dL - - -  Alkaline Phos 38 - 126 U/L - - -  AST 15 - 41 U/L - - -  ALT 0 - 44 U/L - - -    Renal function Estimated Creatinine Clearance: 43.1 mL/min (by C-G formula based on SCr of 1.17 mg/dL).  Hgb A1c MFr Bld (%)  Date Value  10/13/2018 6.3 (H)    LDL Chol Calc (NIH)  Date Value Ref Range Status  01/16/2020 100 (H) 0 - 99 mg/dL Final   CT angiogram personally reviewed. Severe bilateral common iliac stenosis proximal to previously stented left iliac lesion Left iliac stenting patent Left external iliac artery severely stenosed  Left common femoral artery appears patent. Left superficial femoral artery stenting patent at its origin but occludes Reconstitution about the popliteal artery at the knee Anterior tibial artery is dominant and courses to the foot filling the dorsalis pedis  [name], MD [facility] Phone Number: [phone] 10/05/2020 9:15  PM  Total time spent on preparing this encounter including chart review, data review, collecting history, examining the patient, coordinating care for this established patient, 40 minutes.  Portions of this report may have been transcribed using voice recognition software.  Every effort has been made to ensure accuracy; however, inadvertent computerized transcription errors may still be present.

## 2020-10-05 NOTE — ED Notes (Signed)
0.5 mg Dilaudid wasted with this nurse and [employer], RN

## 2020-10-05 NOTE — ED Triage Notes (Signed)
Arrives via EMS from home. Had his pinky toe amputated 2 months ago, reports pain at surgical site, numbness and tingling on pad of foot.   Vitals  BP 112/70 HR 115 O2 97%

## 2020-10-05 NOTE — ED Notes (Signed)
[facility] called for pt transport  

## 2020-10-05 NOTE — H&P (View-Only) (Signed)
[facility] SPECIALISTS OF [place]  ASSESSMENT / PLAN: [name]. is a 60 y.o. 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  Patient counseled patients with chronic limb threatening ischemia have an annual risk of cardiovascular mortality of 25% and a high risk of amputation.   Recommend the following which can slow the progression of atherosclerosis and reduce the risk of major adverse cardiac / limb events:  Complete cessation from all tobacco products. Blood glucose control with goal A1c < 7%. Blood pressure control with goal blood pressure < 140/90 mmHg. Lipid reduction therapy with goal LDL-C <100 mg/dL (<70 if symptomatic from PAD).  Aspirin 97m PO QD.  Atorvastatin 40-80mg PO QD (or other "high intensity" statin therapy).  Recommend admission to internal medicine.  Plan left lower extremity lower extremity angiogram with possible intervention via right common femoral artery approach in cath lab tomorrow 10/06/2020 with Dr. CDonzetta Matters   CHIEF COMPLAINT: Left leg pain  HISTORY OF PRESENT ILLNESS: [name] is a 60y.o. male well-known to our service with who presents the ER today with a 1 week history of left foot pain.  The patient reports the pain is located in the ball of his foot.  The pain is associated with paresthesias and numbness.  Patient is able to move the foot.  Patient underwent an endovascular revascularization 06/10/2020 for ischemic ulceration of the left fifth toe.  He subsequently had a toe amputation by Dr. MSherryle Lisof podiatry.  The patient followed up in our clinic on 07/21/2020 with fairly stable symptoms.  He reports a slow decline in ability to ambulate since that time.  VASCULAR SURGICAL HISTORY:  Left SFA and popliteal artery stenting, left common iliac artery stenting 06/10/2020 with Dr. BTrula Slade VASCULAR RISK FACTORS: Negative history of stroke / transient ischemic  attack. Negative history of coronary artery disease.  Patient denies diabetes mellitus. Last A1c 6.3. Positive history of smoking. + actively smoking. Positive history of hypertension.  Negative history of chronic kidney disease.  Last GFR greater than 60.  Negative history of chronic obstructive pulmonary disease.  FUNCTIONAL STATUS: ECOG performance status: (0) Fully active, able to carry on all predisease performance without restriction Ambulatory status: Ambulatory within the community without limits  Past Medical History:  Diagnosis Date   Diabetes mellitus without complication ([place]    Hyperlipidemia    Hypertension    Peripheral vascular disease ([place]    Tobacco use     Past Surgical History:  Procedure Laterality Date   ABDOMINAL AORTOGRAM W/LOWER EXTREMITY Left 07/16/2019   Procedure: ABDOMINAL AORTOGRAM W/LOWER EXTREMITY;  Surgeon: BLorretta Harp MD;  [facility];  Service: Cardiovascular;  Laterality: Left;   ABDOMINAL AORTOGRAM W/LOWER EXTREMITY Left 08/16/2019   ABDOMINAL AORTOGRAM W/LOWER EXTREMITY N/A 08/16/2019   Procedure: ABDOMINAL AORTOGRAM W/LOWER EXTREMITY;  Surgeon: BLorretta Harp MD;  [facility];  Service: Cardiovascular;  Laterality: N/A;   ABDOMINAL AORTOGRAM W/LOWER EXTREMITY Bilateral 12/19/2019   Procedure: ABDOMINAL AORTOGRAM W/LOWER EXTREMITY;  Surgeon: AWellington Hampshire MD;  [facility];  Service: Cardiovascular;  Laterality: Bilateral;   ABDOMINAL AORTOGRAM W/LOWER EXTREMITY Left 06/10/2020   Procedure: ABDOMINAL AORTOGRAM W/LOWER EXTREMITY;  Surgeon: BSerafina Mitchell MD;  [facility];  Service: Cardiovascular;  Laterality: Left;   AMPUTATION TOE Left 06/13/2020   Procedure: Left Fifth Toe Amputation ;  Surgeon: MCriselda Peaches DPM;  Location: [facility];  Service: Podiatry;  Laterality: Left;   arm surgery     PERIPHERAL VASCULAR BALLOON ANGIOPLASTY  08/16/2019   Procedure:  PERIPHERAL VASCULAR BALLOON ANGIOPLASTY;  Surgeon: Lorretta Harp, MD;  Location: [place];  Service: Cardiovascular;;  attempted PTA of Left SFA   PERIPHERAL VASCULAR INTERVENTION Right 07/16/2019   Procedure: PERIPHERAL VASCULAR INTERVENTION;  Surgeon: Lorretta Harp, MD;  Location: [facility];  Service: Cardiovascular;  Laterality: Right;   PERIPHERAL VASCULAR INTERVENTION Right 12/19/2019   Procedure: PERIPHERAL VASCULAR INTERVENTION;  Surgeon: Wellington Hampshire, MD;  Location: [facility] [facility];  Service: Cardiovascular;  Laterality: Right;  iliac   PERIPHERAL VASCULAR INTERVENTION Left 06/10/2020   Procedure: PERIPHERAL VASCULAR INTERVENTION;  Surgeon: Serafina Mitchell, MD;  Location: [facility];  Service: Cardiovascular;  Laterality: Left;  SFA /COMMON ILIAC    Family History  Problem Relation Age of Onset   Cancer Mother    Diabetes Father     Social History   Socioeconomic History   Marital status: Legally Separated    Spouse name: Not on file   Number of children: Not on file   Years of education: Not on file   Highest education level: Not on file  Occupational History   Not on file  Tobacco Use   Smoking status: Every Day    Packs/day: 0.50    Years: 40.00    Pack years: 20.00    Types: Cigarettes   Smokeless tobacco: Never  Vaping Use   Vaping Use: Never used  Substance and Sexual Activity   Alcohol use: Yes    Comment: beer and liquor daily   Drug use: Yes    Frequency: 3.0 times per week    Types: Marijuana   Sexual activity: Not on file  Other Topics Concern   Not on file  Social History Narrative   Not on file   Social Determinants of Health   Financial Resource Strain: Not on file  Food Insecurity: Not on file  Transportation Needs: Not on file  Physical Activity: Not on file  Stress: Not on file  Social Connections: Not on file  Intimate Partner Violence: Not on file    No Known Allergies  Current  Facility-Administered Medications  Medication Dose Route Frequency Provider Last Rate Last Admin   heparin ADULT infusion 100 units/mL (25000 units/298m)  950 Units/hr Intravenous Continuous Shade, Christine E, RPH 9.5 mL/hr at 10/05/20 2047 950 Units/hr at 10/05/20 2047   sodium chloride flush (NS) 0.9 % injection 3 mL  3 mL Intravenous Q12H BLorretta Harp MD       Current Outpatient Medications  Medication Sig Dispense Refill   aspirin EC 81 MG EC tablet Take 1 tablet (81 mg total) by mouth daily. Swallow whole. (Patient taking differently: Take 81 mg by mouth daily at 2 PM. Swallow whole.) 90 tablet 3   atorvastatin (LIPITOR) 40 MG tablet Take 40 mg by mouth daily.     clopidogrel (PLAVIX) 75 MG tablet Take 1 tablet (75 mg total) by mouth daily with breakfast. 90 tablet 3   gabapentin (NEURONTIN) 600 MG tablet Take 1 tablet (600 mg) in morning and afternoon. Take 2 tablets (1200 mg) at night (Patient taking differently: Take 600 mg by mouth See admin instructions. Take 3 tablet (1800 mg) in morning and  2 tablets (1200 mg) at night) 120 tablet 0   lisinopril-hydrochlorothiazide (ZESTORETIC) 20-25 MG tablet Take 1 tablet by  mouth daily.     SANTYL ointment Apply 1 application topically 2 (two) times daily.     traMADol (ULTRAM) 50 MG tablet Take 50 mg by mouth every 6 (six) hours as needed for moderate pain.     Blood Pressure Monitor KIT Check blood pressure daily (Patient taking differently: 1 each by Other route once a week.) 1 kit 0   docusate sodium (COLACE) 100 MG capsule Take 1 capsule (100 mg total) by mouth 2 (two) times daily. (Patient not taking: No sig reported) 10 capsule 0   nicotine (NICODERM CQ - DOSED IN MG/24 HR) 7 mg/24hr patch Place 1 patch (7 mg total) onto the skin daily. (Patient not taking: No sig reported) 28 patch 0   oxyCODONE-acetaminophen (PERCOCET/ROXICET) 5-325 MG tablet Take 1-2 tablets by mouth every 6 (six) hours as needed for moderate pain. (Patient not  taking: No sig reported) 30 tablet 0    REVIEW OF SYSTEMS:  _0  denotes positive finding, _1  denotes negative finding Cardiac  Comments:  Chest pain or chest pressure:    Shortness of breath upon exertion:    Short of breath when lying flat:    Irregular heart rhythm:        Vascular    Pain in calf, thigh, or hip brought on by ambulation:    Pain in feet at night that wakes you up from your sleep:     Blood clot in your veins:    Leg swelling:         Pulmonary    Oxygen at home:    Productive cough:     Wheezing:         Neurologic    Sudden weakness in arms or legs:     Sudden numbness in arms or legs:     Sudden onset of difficulty speaking or slurred speech:    Temporary loss of vision in one eye:     Problems with dizziness:         Gastrointestinal    Blood in stool:     Vomited blood:         Genitourinary    Burning when urinating:     Blood in urine:        Psychiatric    Major depression:         Hematologic    Bleeding problems:    Problems with blood clotting too easily:        Skin    Rashes or ulcers:        Constitutional    Fever or chills:      PHYSICAL EXAM Vitals:   10/05/20 1843 10/05/20 1946 10/05/20 2032 10/05/20 2100  BP: (!) 146/95 (!) 151/97 (!) 146/82 (!) 140/91  Pulse: (!) 111 97 84 100  Resp: _2 Temp:      SpO2: 100% 100% 100% 100%  Weight:      Height:        Constitutional: Chronically ill appearing. No distress. Appears marginally nourished.  Neurologic: CN intact. no focal findings.  Normal motor and sensory function in the left foot. Psychiatric:  Mood and affect symmetric and appropriate. Eyes: muddly sclera. No conjunctival pallor. Ears, nose, throat:  mucous membranes moist. Midline trachea.  Cardiac: regular rate and rhythm.  Respiratory:  unlabored. Abdominal:  soft, non-tender, non-distended.  Peripheral vascular: 2+ right common femoral artery pulse. No palpable left common femoral artery  pulse Weak monophasic Doppler flow in the left  anterior tibial artery Left fifth toe surgically absent.  Eschar on surgical scar. Extremity: no edema. no cyanosis. no pallor.  Skin: no gangrene. no ulceration.  Lymphatic: no Stemmer's sign. no palpable lymphadenopathy.  PERTINENT LABORATORY AND RADIOLOGIC DATA  Most recent CBC CBC Latest Ref Rng & Units 10/05/2020 06/16/2020 06/14/2020  WBC 4.0 - 10.5 K/uL 5.1 6.3 6.8  Hemoglobin 13.0 - 17.0 g/dL 15.1 9.9(L) 9.5(L)  Hematocrit 39.0 - 52.0 % 46.1 31.7(L) 29.7(L)  Platelets 150 - 400 K/uL 212 363 280     Most recent CMP CMP Latest Ref Rng & Units 10/05/2020 06/16/2020 06/15/2020  Glucose 70 - 99 mg/dL 86 94 -  BUN 8 - 23 mg/dL 18 16 -  Creatinine 0.61 - 1.24 mg/dL 1.17 0.87 0.89  Sodium 135 - 145 mmol/L 137 136 -  Potassium 3.5 - 5.1 mmol/L 3.7 4.2 -  Chloride 98 - 111 mmol/L 101 104 -  CO2 22 - 32 mmol/L 23 27 -  Calcium 8.9 - 10.3 mg/dL 10.2 9.0 -  Total Protein 6.5 - 8.1 g/dL - - -  Total Bilirubin 0.3 - 1.2 mg/dL - - -  Alkaline Phos 38 - 126 U/L - - -  AST 15 - 41 U/L - - -  ALT 0 - 44 U/L - - -    Renal function Estimated Creatinine Clearance: 43.1 mL/min (by C-G formula based on SCr of 1.17 mg/dL).  Hgb A1c MFr Bld (%)  Date Value  10/13/2018 6.3 (H)    LDL Chol Calc (NIH)  Date Value Ref Range Status  01/16/2020 100 (H) 0 - 99 mg/dL Final   CT angiogram personally reviewed. Severe bilateral common iliac stenosis proximal to previously stented left iliac lesion Left iliac stenting patent Left external iliac artery severely stenosed  Left common femoral artery appears patent. Left superficial femoral artery stenting patent at its origin but occludes Reconstitution about the popliteal artery at the knee Anterior tibial artery is dominant and courses to the foot filling the dorsalis pedis  [name]. Stanford Breed, MD [facility] Phone Number: [phone] 10/05/2020 9:15  PM  Total time spent on preparing this encounter including chart review, data review, collecting history, examining the patient, coordinating care for this established patient, 40 minutes.  Portions of this report may have been transcribed using voice recognition software.  Every effort has been made to ensure accuracy; however, inadvertent computerized transcription errors may still be present.

## 2020-10-05 NOTE — ED Provider Notes (Signed)
[facility] DEPT Provider Note   CSN: [id number] Arrival date & time: 10/05/20  1630     History Chief Complaint  Patient presents with   Toe Pain   foot numbness    [name]. is a 60 y.o. 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  Patient states that over the last week he has had gradually worsening right lower extremity pain that extends up into the calf.  He states that his pain acutely worsened at 11 AM this morning and is severe compared to previous.  Denies fever, chest pain, shortness of breath, abdominal pain with nausea, vomiting or other systemic symptoms.  No palpable pulses on arrival with mild temperature difference noted.   Toe Pain Pertinent negatives include no chest pain, no abdominal pain and no shortness of breath.      Past Medical History:  Diagnosis Date   Diabetes mellitus without complication ([place])    Hyperlipidemia    Hypertension    Peripheral vascular disease ([place])    Tobacco use     Patient Active Problem List   Diagnosis Date Noted   Protein-calorie malnutrition, severe 06/07/2020   Osteomyelitis of fifth toe of left foot ([place]) 06/07/2020   Tobacco use    Toe infection 06/06/2020   PVD (peripheral vascular disease) ([place]) 12/19/2019   Claudication in peripheral vascular disease ([place]) 08/16/2019   Critical ischemia of foot ([place]) 07/16/2019   Critical lower limb ischemia ([place]) 07/10/2019   Pure hypercholesterolemia 10/13/2018   Type 2 diabetes mellitus without complication, without long-term current use of insulin ([place]) 10/13/2018   Tobacco use disorder 10/13/2018   Essential hypertension, benign 10/23/2009    Past Surgical History:  Procedure Laterality Date   ABDOMINAL AORTOGRAM W/LOWER EXTREMITY Left 07/16/2019   Procedure: ABDOMINAL AORTOGRAM W/LOWER EXTREMITY;   Surgeon: Lorretta Harp, MD;  Location: [facility];  Service: Cardiovascular;  Laterality: Left;   ABDOMINAL AORTOGRAM W/LOWER EXTREMITY Left 08/16/2019   ABDOMINAL AORTOGRAM W/LOWER EXTREMITY N/A 08/16/2019   Procedure: ABDOMINAL AORTOGRAM W/LOWER EXTREMITY;  Surgeon: Lorretta Harp, MD;  Location: [place];  Service: Cardiovascular;  Laterality: N/A;   ABDOMINAL AORTOGRAM W/LOWER EXTREMITY Bilateral 12/19/2019   Procedure: ABDOMINAL AORTOGRAM W/LOWER EXTREMITY;  Surgeon: Wellington Hampshire, MD;  Location: [facility];  Service: Cardiovascular;  Laterality: Bilateral;   ABDOMINAL AORTOGRAM W/LOWER EXTREMITY Left 06/10/2020   Procedure: ABDOMINAL AORTOGRAM W/LOWER EXTREMITY;  Surgeon: Serafina Mitchell, MD;  Location: [facility];  Service: Cardiovascular;  Laterality: Left;   AMPUTATION TOE Left 06/13/2020   Procedure: Left Fifth Toe Amputation ;  Surgeon: Criselda Peaches, DPM;  Location: [facility];  Service: Podiatry;  Laterality: Left;   arm surgery     PERIPHERAL VASCULAR BALLOON ANGIOPLASTY  08/16/2019   Procedure: PERIPHERAL VASCULAR BALLOON ANGIOPLASTY;  Surgeon: Lorretta Harp, MD;  Location: [facility];  Service: Cardiovascular;;  attempted PTA of Left SFA   PERIPHERAL VASCULAR INTERVENTION Right 07/16/2019   Procedure: PERIPHERAL VASCULAR INTERVENTION;  Surgeon: Lorretta Harp, MD;  Location: [facility];  Service: Cardiovascular;  Laterality: Right;   PERIPHERAL VASCULAR INTERVENTION Right 12/19/2019   Procedure: PERIPHERAL VASCULAR INTERVENTION;  Surgeon: Wellington Hampshire, MD;  Location: [facility];  Service: Cardiovascular;  Laterality: Right;  iliac   PERIPHERAL VASCULAR INTERVENTION Left 06/10/2020  Procedure: PERIPHERAL VASCULAR INTERVENTION;  Surgeon: Serafina Mitchell, MD;  Location: [facility];  Service: Cardiovascular;  Laterality: Left;  SFA /COMMON ILIAC       Family History  Problem Relation Age of Onset    Cancer Mother    Diabetes Father     Social History   Tobacco Use   Smoking status: Every Day    Packs/day: 0.50    Years: 40.00    Pack years: 20.00    Types: Cigarettes   Smokeless tobacco: Never  Vaping Use   Vaping Use: Never used  Substance Use Topics   Alcohol use: Yes    Comment: beer and liquor daily   Drug use: Yes    Frequency: 3.0 times per week    Types: Marijuana    Home Medications Prior to Admission medications   Medication Sig Start Date End Date Taking? Authorizing Provider  aspirin EC 81 MG EC tablet Take 1 tablet (81 mg total) by mouth daily. Swallow whole. Patient taking differently: Take 81 mg by mouth daily at 2 PM. Swallow whole. 07/18/19   Furth, Cadence H, PA-C  atorvastatin (LIPITOR) 40 MG tablet Take 1 tablet by mouth at bedtime. 04/06/20   [provider]  Blood Pressure Monitor KIT Check blood pressure daily Patient taking differently: 1 each by Other route once a week. 10/20/18   Jacelyn Pi, Lilia Argue, MD  clopidogrel (PLAVIX) 75 MG tablet Take 1 tablet (75 mg total) by mouth daily with breakfast. 07/18/19   Furth, Cadence H, PA-C  docusate sodium (COLACE) 100 MG capsule Take 1 capsule (100 mg total) by mouth 2 (two) times daily. 06/16/20   Lavina Hamman, MD  gabapentin (NEURONTIN) 600 MG tablet Take 1 tablet (600 mg) in morning and afternoon. Take 2 tablets (1200 mg) at night 06/16/20   Lavina Hamman, MD  lisinopril-hydrochlorothiazide (ZESTORETIC) 20-25 MG tablet Take 1 tablet by mouth daily. 04/16/20   [provider]  nicotine (NICODERM CQ - DOSED IN MG/24 HR) 7 mg/24hr patch Place 1 patch (7 mg total) onto the skin daily. Patient not taking: Reported on 07/21/2020 06/17/20   Lavina Hamman, MD  oxyCODONE-acetaminophen (PERCOCET/ROXICET) 5-325 MG tablet Take 1-2 tablets by mouth every 6 (six) hours as needed for moderate pain. Patient not taking: Reported on 07/21/2020 06/16/20   Lavina Hamman, MD  SANTYL ointment Apply 1  application topically daily as needed (wound care).  08/15/19   [provider]  SSD 1 % cream Apply 1 application topically daily as needed (wound care).  08/07/19   [provider]  traMADol (ULTRAM) 50 MG tablet Take 50 mg by mouth every 6 (six) hours as needed. 07/11/20   [provider]    Allergies    Patient has no known allergies.  Review of Systems   Review of Systems  Constitutional:  Negative for chills and fever.  HENT:  Negative for ear pain and sore throat.   Eyes:  Negative for pain and visual disturbance.  Respiratory:  Negative for cough and shortness of breath.   Cardiovascular:  Negative for chest pain and palpitations.  Gastrointestinal:  Negative for abdominal pain and vomiting.  Genitourinary:  Negative for dysuria and hematuria.  Musculoskeletal:  Negative for arthralgias and back pain.       Right lower extremity pain  Skin:  Negative for color change and rash.  Neurological:  Negative for seizures and syncope.  All other systems reviewed and are  negative.  Physical Exam Updated Vital Signs BP (!) 146/95 (BP Location: Left Arm)   Pulse (!) 111   Temp 98.8 F (37.1 C)   Resp 19   Ht _0  (1.549 m)   Wt 46 kg   SpO2 100%   BMI 19.16 kg/m   Physical Exam Vitals and nursing note reviewed.  Constitutional:      Appearance: He is well-developed.  HENT:     Head: Normocephalic and atraumatic.  Eyes:     Conjunctiva/sclera: Conjunctivae normal.  Cardiovascular:     Rate and Rhythm: Normal rate and regular rhythm.     Heart sounds: No murmur heard. Pulmonary:     Effort: Pulmonary effort is normal. No respiratory distress.     Breath sounds: Normal breath sounds.  Abdominal:     Palpations: Abdomen is soft.     Tenderness: There is no abdominal tenderness.  Musculoskeletal:        General: Tenderness present.     Cervical back: Neck supple.     Comments: No palpable pulses in the right lower extremity, temperature  differences with right cold compared to left  Skin:    General: Skin is warm and dry.  Neurological:     Mental Status: He is alert.    ED Results / Procedures / Treatments   Labs (all labs ordered are listed, but only abnormal results are displayed) Labs Reviewed  CBC WITH DIFFERENTIAL/PLATELET - Abnormal; Notable for the following components:      Result Value   RDW 16.8 (*)    All other components within normal limits  LACTIC ACID, PLASMA - Abnormal; Notable for the following components:   Lactic Acid, Venous 2.6 (*)    All other components within normal limits  CULTURE, BLOOD (ROUTINE X 2)  CULTURE, BLOOD (ROUTINE X 2)  BASIC METABOLIC PANEL  LACTIC ACID, PLASMA  SEDIMENTATION RATE  C-REACTIVE PROTEIN    EKG None  Radiology DG Foot Complete Left  Result Date: 10/05/2020 CLINICAL DATA:  Generalized left foot pain, erythema for several days EXAM: LEFT FOOT - COMPLETE 3+ VIEW COMPARISON:  06/13/2020 FINDINGS: Frontal, oblique, and lateral views of the left foot are obtained. Prior amputation of the fifth digit at the level of the metatarsophalangeal joint. No acute or destructive bony lesions. Mild osteoarthritis of the first metatarsophalangeal joint and throughout the midfoot unchanged. Soft tissues are grossly unremarkable. IMPRESSION: 1. No acute or destructive bony lesions. 2. Stable degenerative changes. 3. Stable prior fifth digit amputation. Electronically Signed   By: Randa Ngo M.D.   On: 10/05/2020 17:28    Procedures Procedures   Medications Ordered in ED Medications  fentaNYL (SUBLIMAZE) injection 50 mcg (has no administration in time range)    ED Course  I have reviewed the triage vital signs and the nursing notes.  Pertinent labs & imaging results that were available during my care of the patient were reviewed by me and considered in my medical decision making (see chart for details).    MDM Rules/Calculators/A&P                            Patient seen emergency department for evaluation of right lower extremity pain.  Physical exam reveals significant right lower extremity pain to palpation up into the calf.  Pain with any movement.  No pulses felt in the right lower extremity distal to the popliteal and a temperature difference was noted with right  colder than left.  Patient started immediately on a heparin drip and vascular surgery consulted.  Initial lactate 2.2 but laboratory evaluation largely unremarkable outside of a sed rate of 47, CRP 1.4, x-ray of the foot unremarkable.  CT angio aortobifem runoff with SMA stenosis, complete occlusion of the stent in the left superficial femoral artery, diminished flow in the right anterior tibial and fibular artery.  Vascular surgery evaluated the patient at bedside and is recommending transfer to [facility], admission to medicine with angiogram performed tomorrow.  Patient then admitted.  Patient currently on heparin drip.  Final Clinical Impression(s) / ED Diagnoses Final diagnoses:  None    Rx / DC Orders ED Discharge Orders     None        Andi [place]field, Debe Coder, MD 10/05/20 2126

## 2020-10-05 NOTE — Progress Notes (Signed)
ANTICOAGULATION CONSULT NOTE - Initial Consult  Pharmacy Consult for Heparin IV Indication: arterial embolus  No Known Allergies  Patient Measurements: Height: '5\' 1"'$  (154.9 cm) Weight: 46 kg (101 lb 6.6 oz) IBW/kg (Calculated) : 52.3 Heparin Dosing Weight: TBW  Vital Signs: Temp: 98.8 F (37.1 C) (09/11 1637) BP: 146/95 (09/11 1843) Pulse Rate: 111 (09/11 1843)  Labs: Recent Labs    10/05/20 1712  HGB 15.1  HCT 46.1  PLT 212  CREATININE 1.17    Estimated Creatinine Clearance: 43.1 mL/min (by C-G formula based on SCr of 1.17 mg/dL).   Medical History: Past Medical History:  Diagnosis Date   Diabetes mellitus without complication ([place])    Hyperlipidemia    Hypertension    Peripheral vascular disease ([place])    Tobacco use     Medications:  Infusions:   Assessment: 49 yoM presented to ED with toe pain, foot numbness.  Pharmacy is consulted to dose Heparin IV for arterial embolus, dead leg.   No prior to admission anticoagulation noted.  Baseline coags pending.   Goal of Therapy:  Heparin level 0.3-0.7 units/ml Monitor platelets by anticoagulation protocol: Yes   Plan:  Baseline PTT, PT/INR Give heparin 1550 units bolus IV x 1 Start heparin IV infusion at 950 units/hr Heparin level 6 hours after starting Daily heparin level and CBC   Gretta Arab PharmD, BCPS Clinical Pharmacist [facility] main pharmacy [phone] 10/05/2020 8:01 PM

## 2020-10-05 NOTE — ED Notes (Signed)
[facility] present for pt transfer

## 2020-10-05 NOTE — ED Notes (Signed)
Report called to [place], Therapist, sports on 4E at [facility]

## 2020-10-05 NOTE — H&P (Signed)
History and Physical    Ruben J Grabbe Jr. 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  Patient states that approximately 2 weeks ago he began to develop pain in his left lower extremity.  Patient describes his pain as sharp in quality as well as waxing and waning.  Pain gradually worsened over the next several days and became severe in intensity.  Pain radiates from the foot proximally up the leg and thigh.  Pain is worse with movement of the affected extremity and weightbearing.  Pain is associated with tingling and numbness of the foot and toes.  Due to progressively worsening severe pain patient eventually presents to [facility] emergency department for evaluation.  Patient in the emergency department physical exam was concerning for left lower extremity limb ischemia.  CT angiogram of the aorta with iliofemoral runoff revealing a complete occlusion of the stent of the left superficial femoral artery as well as nonopacification of the left posterior tibial artery.  ER provider consulted Dr. Hawken with vascular surgery who promptly came to evaluate patient at bedside and recommended admission to the medicine service with transfer to [facility] for planned left lower extremity angiogram with possible intervention tomorrow morning with Dr. Cain.  Patient was initiated on heparin infusion.  The  hospitalist group was then called to assess the patient for admission to the hospital.  Review of Systems:   Review of Systems  Musculoskeletal:        Left lower extremity pain  Neurological:  Positive for tingling.  All other systems reviewed and are negative.  Past Medical History:  Diagnosis Date   Diabetes mellitus without complication (HCC)    Hyperlipidemia    Hypertension    Peripheral vascular disease (HCC)    Tobacco use     Past Surgical History:  Procedure Laterality Date   ABDOMINAL AORTOGRAM W/LOWER EXTREMITY Left 07/16/2019   Procedure: ABDOMINAL AORTOGRAM W/LOWER EXTREMITY;  Surgeon: Berry, Jonathan J, MD;  [facility];  Service: Cardiovascular;  Laterality: Left;   ABDOMINAL AORTOGRAM W/LOWER EXTREMITY Left 08/16/2019   ABDOMINAL AORTOGRAM W/LOWER EXTREMITY N/A 08/16/2019   Procedure: ABDOMINAL AORTOGRAM W/LOWER EXTREMITY;  Surgeon: Berry, Jonathan J, MD;  Location: [facility];  Service: Cardiovascular;  Laterality: N/A;   ABDOMINAL AORTOGRAM W/LOWER EXTREMITY Bilateral 12/19/2019   Procedure: ABDOMINAL AORTOGRAM W/LOWER EXTREMITY;  Surgeon: Arida, Muhammad A, MD;  Location: [facility];  Service: Cardiovascular;  Laterality: Bilateral;   ABDOMINAL AORTOGRAM W/LOWER EXTREMITY Left 06/10/2020   Procedure: ABDOMINAL AORTOGRAM W/LOWER EXTREMITY;  Surgeon: Brabham, Vance W, MD;  Location: [facility];  Service: Cardiovascular;  Laterality: Left;   AMPUTATION TOE Left 06/13/2020   Procedure: Left Fifth Toe Amputation ;  Surgeon: McDonald, Adam R, DPM;  Location: [facility];  Service: Podiatry;  Laterality: Left;   arm surgery     PERIPHERAL VASCULAR BALLOON ANGIOPLASTY  08/16/2019   Procedure: PERIPHERAL   VASCULAR BALLOON ANGIOPLASTY;  Surgeon: Berry, Jonathan J, MD;  [facility];  Service: Cardiovascular;;  attempted PTA of Left SFA   PERIPHERAL VASCULAR INTERVENTION Right 07/16/2019   Procedure: PERIPHERAL VASCULAR  INTERVENTION;  Surgeon: Berry, Jonathan J, MD;  [facility];  Service: Cardiovascular;  Laterality: Right;   PERIPHERAL VASCULAR INTERVENTION Right 12/19/2019   Procedure: PERIPHERAL VASCULAR INTERVENTION;  Surgeon: Arida, Muhammad A, MD;  [facility];  Service: Cardiovascular;  Laterality: Right;  iliac   PERIPHERAL VASCULAR INTERVENTION Left 06/10/2020   Procedure: PERIPHERAL VASCULAR INTERVENTION;  Surgeon: Brabham, Vance W, MD;  [facility];  Service: Cardiovascular;  Laterality: Left;  SFA /COMMON ILIAC     reports that he has been smoking cigarettes. He has a 20.00 pack-year smoking history. He has never used smokeless tobacco. He reports current alcohol use. He reports current drug use. Frequency: 3.00 times per week. Drug: Marijuana.  No Known Allergies  Family History  Problem Relation Age of Onset   Cancer Mother    Diabetes Father      Prior to Admission medications   Medication Sig Start Date End Date Taking? Authorizing Provider  aspirin EC 81 MG EC tablet Take 1 tablet (81 mg total) by mouth daily. Swallow whole. Patient taking differently: Take 81 mg by mouth daily at 2 PM. Swallow whole. 07/18/19  Yes Furth, Cadence H, PA-C  atorvastatin (LIPITOR) 40 MG tablet Take 40 mg by mouth daily. 04/06/20  Yes [provider]  clopidogrel (PLAVIX) 75 MG tablet Take 1 tablet (75 mg total) by mouth daily with breakfast. 07/18/19  Yes Furth, Cadence H, PA-C  gabapentin (NEURONTIN) 600 MG tablet Take 1 tablet (600 mg) in morning and afternoon. Take 2 tablets (1200 mg) at night Patient taking differently: Take 600 mg by mouth See admin instructions. Take 3 tablet (1800 mg) in morning and  2 tablets (1200 mg) at night 06/16/20  Yes Patel, Pranav M, MD  lisinopril-hydrochlorothiazide (ZESTORETIC) 20-25 MG tablet Take 1 tablet by mouth daily. 04/16/20  Yes [provider]  SANTYL ointment Apply 1 application topically 2 (two)  times daily. 08/15/19  Yes [provider]  traMADol (ULTRAM) 50 MG tablet Take 50 mg by mouth every 6 (six) hours as needed for moderate pain. 07/11/20  Yes [provider]  Blood Pressure Monitor KIT Check blood pressure daily Patient taking differently: 1 each by Other route once a week. 10/20/18   Santiago Lago, Irma M, MD  docusate sodium (COLACE) 100 MG capsule Take 1 capsule (100 mg total) by mouth 2 (two) times daily. Patient not taking: No sig reported 06/16/20   Patel, Pranav M, MD  nicotine (NICODERM CQ - DOSED IN MG/24 HR) 7 mg/24hr patch Place 1 patch (7 mg total) onto the skin daily. Patient not taking: No sig reported 06/17/20   Patel, Pranav M, MD  oxyCODONE-acetaminophen (PERCOCET/ROXICET) 5-325 MG tablet Take 1-2 tablets by mouth every 6 (six) hours as needed for moderate pain. Patient not taking: No sig reported 06/16/20   Patel, Pranav M, MD    Physical Exam: Vitals:   10/05/20 2100 10/05/20 2130 10/05/20 2245 10/05/20 2338  BP: (!) 140/91 137/80 (!) 164/94 (!) 137/91  Pulse: 100 80 76 82  Resp: 16 16 14 16  Temp:    98 F (36.7 C)  TempSrc:    Oral  SpO2: 100% 99% 100% 100%  Weight:      Height:          Constitutional: Awake alert and oriented x3, patient is in distress due to pain. Skin: no rashes, no lesions, good skin turgor noted. Eyes: Pupils are equally reactive to light.  No evidence of scleral icterus or conjunctival pallor.  ENMT: Moist mucous membranes noted.  Posterior pharynx clear of any exudate or lesions.   Neck: normal, supple, no masses, no thyromegaly.  No evidence of jugular venous distension.   Respiratory: clear to auscultation bilaterally, no wheezing, no crackles. Normal respiratory effort. No accessory muscle use.  Cardiovascular: Regular rate and rhythm, no murmurs / rubs / gallops. No extremity edema.  Absent pulses of the left foot on palpation.  No carotid bruits.  Chest:   Nontender without crepitus or deformity.   Back:    Nontender without crepitus or deformity. Abdomen: Abdomen is soft and nontender.  No evidence of intra-abdominal masses.  Positive bowel sounds noted in all quadrants.   Musculoskeletal: Severe tenderness of the plantar surface of the left foot.  Left lower extremity distal to the left knee is cool to the touch.  No joint deformity upper and lower extremities. Good ROM, no contractures. Normal muscle tone.  Neurologic: CN 2-12 grossly intact. Sensation intact.  Patient moving all 4 extremities spontaneously.  Patient is following all commands.  Patient is responsive to verbal stimuli.   Psychiatric: Patient exhibits normal mood with appropriate affect.  Patient seems to possess insight as to their current situation.     Labs on Admission: I have personally reviewed following labs and imaging studies -   CBC: Recent Labs  Lab 10/05/20 1712  WBC 5.1  NEUTROABS 2.7  HGB 15.1  HCT 46.1  MCV 90.0  PLT 174   Basic Metabolic Panel: Recent Labs  Lab 10/05/20 1712  NA 137  K 3.7  CL 101  CO2 23  GLUCOSE 86  BUN 18  CREATININE 1.17  CALCIUM 10.2   GFR: Estimated Creatinine Clearance: 43.1 mL/min (by C-G formula based on SCr of 1.17 mg/dL). Liver Function Tests: No results for input(s): AST, ALT, ALKPHOS, BILITOT, PROT, ALBUMIN in the last 168 hours. No results for input(s): LIPASE, AMYLASE in the last 168 hours. No results for input(s): AMMONIA in the last 168 hours. Coagulation Profile: Recent Labs  Lab 10/05/20 2007  INR 1.1   Cardiac Enzymes: No results for input(s): CKTOTAL, CKMB, CKMBINDEX, TROPONINI in the last 168 hours. BNP (last 3 results) No results for input(s): PROBNP in the last 8760 hours. HbA1C: No results for input(s): HGBA1C in the last 72 hours. CBG: No results for input(s): GLUCAP in the last 168 hours. Lipid Profile: No results for input(s): CHOL, HDL, LDLCALC, TRIG, CHOLHDL, LDLDIRECT in the last 72 hours. Thyroid Function Tests: No results for  input(s): TSH, T4TOTAL, FREET4, T3FREE, THYROIDAB in the last 72 hours. Anemia Panel: No results for input(s): VITAMINB12, FOLATE, FERRITIN, TIBC, IRON, RETICCTPCT in the last 72 hours. Urine analysis:    Component Value Date/Time   COLORURINE STRAW (A) 05/10/2020 1722   APPEARANCEUR CLEAR 05/10/2020 1722   LABSPEC 1.008 05/10/2020 1722   PHURINE 8.0 05/10/2020 1722   GLUCOSEU NEGATIVE 05/10/2020 1722   HGBUR NEGATIVE 05/10/2020 1722   BILIRUBINUR NEGATIVE 05/10/2020 1722   KETONESUR 5 (A) 05/10/2020 1722   PROTEINUR NEGATIVE 05/10/2020 1722   NITRITE NEGATIVE 05/10/2020 1722   LEUKOCYTESUR NEGATIVE 05/10/2020 1722    Radiological Exams on Admission - Personally Reviewed: CT ANGIO AO+BIFEM W & OR WO CONTRAST  Result Date: 10/05/2020 CLINICAL DATA:  Concern for  arterial embolism and lower extremity arterial occlusion. EXAM: CT ANGIOGRAPHY OF ABDOMINAL AORTA WITH ILIOFEMORAL RUNOFF TECHNIQUE: Multidetector CT imaging of the abdomen, pelvis and lower extremities was performed using the standard protocol during bolus administration of intravenous contrast. Multiplanar CT image reconstructions and MIPs were obtained to evaluate the vascular anatomy. CONTRAST:  159m OMNIPAQUE IOHEXOL 350 MG/ML SOLN COMPARISON:  None. FINDINGS: VASCULAR Aorta: Moderate atherosclerotic calcification of the abdominal aorta. No aneurysmal dilatation or dissection. No periaortic fluid collection. 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 Celiac: The celiac artery and its major branches are patent. SMA: There is diffuse thickening of the wall of the proximal SMA with luminal narrowing. There is a focal area of high-grade luminal narrowing in the proximal SMA with greater than 50% luminal narrowing (37/4). The SMA remains patent. Renals: Both renal arteries are patent without evidence of aneurysm, dissection, vasculitis, fibromuscular  dysplasia or significant stenosis. IMA: Patent without evidence of aneurysm, dissection, vasculitis or significant stenosis. RIGHT Lower Extremity Inflow: Advanced atherosclerotic calcification of the right common iliac artery with approximately 50% luminal narrowing. A stent is noted extending from the common iliac artery into the right external iliac artery. The stent is patent. There is atherosclerotic calcification of the distal right external iliac artery with luminal narrowing. The external iliac artery however remains patent. There is atherosclerotic calcification of the right internal iliac artery with severe luminal narrowing and diminished flow. Outflow: Atherosclerotic calcification of the common femoral artery with luminal narrowing. The common femoral artery remains patent. The profundus femoris is patent. There is advanced atherosclerotic calcification of the superficial femoral artery with luminal narrowing. The SFA is patent. Popliteal artery is also patent. Runoff: There is minimal flow in the anterior tibial artery. There is reconstitution of flow in the dorsalis pedis artery. The posterior tibial artery and plantar arteries are patent. Very diminished flow in the fibular artery. LEFT Lower Extremity Inflow: There is advanced atherosclerotic calcification of the left common iliac artery with severe luminal narrowing, greater than 50%. A stent is noted in the distal left common iliac artery which appears patent. Advanced atherosclerotic calcification of the left internal and external iliac arteries. There is minimal flow in the left internal iliac artery. There is moderate narrowing of the left external iliac artery. The left external iliac artery remains patent. Outflow: There is atherosclerotic calcification and narrowing of the left common femoral artery. The common femoral arteries patent. The profundus femoris is patent. There is a stent in the left superficial femoral artery which is  completely occluded. There is atherosclerotic plaque with wall thickening of the left popliteal artery and greater than 50% luminal narrowing. The popliteal artery remains patent but with diminished flow. Runoff: The anterior tibial artery and dorsalis pedis artery are patent. The fibular artery is patent to the mid calf. The posterior tibial artery is not opacified, likely occluded. There is reconstitution of minimal flow in the plantar artery. Veins: No obvious venous abnormality within the limitations of this arterial phase study. Review of the MIP images confirms the above findings. NON-VASCULAR Lower chest: The visualized lung bases are clear. No intra-abdominal free air or free fluid. Hepatobiliary: The liver is unremarkable. No intrahepatic biliary ductal dilatation. The gallbladder is unremarkable. Pancreas: Pancreas is unremarkable as visualized. Spleen: Normal in size without focal abnormality. Adrenals/Urinary Tract: The adrenal glands are unremarkable. The kidneys, visualized ureters, and urinary bladder appear unremarkable. Stomach/Bowel: There is no bowel obstruction  or active inflammation. The appendix is normal. Lymphatic: No adenopathy. Reproductive: Enlarged prostate gland. Other: None Musculoskeletal: Degenerative changes of the spine. No acute osseous pathology. IMPRESSION: 1. Advanced atherosclerotic calcification of the aorta and its major branches as described above. There is focal high-grade luminal narrowing of the proximal SMA. 2. Atherosclerotic calcification and narrowing of the iliac arteries. Patent bilateral common iliac artery stents. 3. Complete occlusion of the stent in the left superficial femoral artery. 4. Patent right posterior tibial artery. Very diminished flow in the anterior tibial artery and fibular artery of the right lower extremity. 5. Patent left anterior tibial artery and dorsalis pedis artery. Non opacification of the left posterior tibial artery. 6. 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 Electronically Signed   By: Anner Crete M.D.   On: 10/05/2020 21:16   DG Foot Complete Left  Result Date: 10/05/2020 CLINICAL DATA:  Generalized left foot pain, erythema for several days EXAM: LEFT FOOT - COMPLETE 3+ VIEW COMPARISON:  06/13/2020 FINDINGS: Frontal, oblique, and lateral views of the left foot are obtained. Prior amputation of the fifth digit at the level of the metatarsophalangeal joint. No acute or destructive bony lesions. Mild osteoarthritis of the first metatarsophalangeal joint and throughout the midfoot unchanged. Soft tissues are grossly unremarkable. IMPRESSION: 1. No acute or destructive bony lesions. 2. Stable degenerative changes. 3. Stable prior fifth digit amputation. Electronically Signed   By: Randa Ngo M.D.   On: 10/05/2020 17:28     Assessment/Plan Principal Problem:   Critical lower limb ischemia [facility])  Patient presenting with severe left lower extremity pain and tingling with exam revealing a cold extremity with absent distal pulses CT angiogram of the aorta with iliofemoral runoff reveals complete occlusion of the stent of the left superficial femoral artery with nonopacification of the left posterior tibial artery. Continuing heparin infusion initiated by the emergency department staff As needed opiate-based analgesics for substantial associated pain Gentle intravenous hydration Patient is already been evaluated by Dr. Stanford Breed with ocular surgery, his input is appreciated.  He recommends transfer to [facility] with patient being n.p.o. after midnight for angiogram with potential intervention to be performed by Dr. Donzetta Matters tomorrow morning. Continue home regimen of statin therapy, checking lipid panel  Active Problems:   Essential hypertension  Continue home regimen of antihypertensive therapy    Type 2 diabetes mellitus without complication, without  long-term current use of insulin (HCC)  Accu-Cheks before every meal and nightly with slight scale insulin Hemoglobin A1c pending    Nicotine dependence, cigarettes, uncomplicated  Ongoing cigarette smoking is likely playing a key role in patient's severe peripheral vascular disease Counseling on cessation    Mixed hyperlipidemia  Continuing statin therapy Obtaining lipid panel in the morning    Code Status:  Full code  code status decision has been confirmed with: patient Family Communication: deferred   Status is: Inpatient  Remains inpatient appropriate because:Ongoing active pain requiring inpatient pain management, Ongoing diagnostic testing needed not appropriate for outpatient work up, IV treatments appropriate due to intensity of illness or inability to take PO, and Inpatient level of care appropriate due to severity of illness  Dispo: The patient is from: Home              Anticipated d/c is to: Home              Patient currently is not medically stable to d/c.   Difficult to  place patient No        George J Shalhoub MD [facility] Pager [phone]  If 7PM-7AM, please contact night-coverage [email or web address] Use universal [place] password for that web site. If you do not have the password, please call the hospital operator.  10/06/2020, 12:10 AM    

## 2020-10-05 NOTE — ED Notes (Signed)
Pt in CT so vitals cannot be obtained

## 2020-10-06 ENCOUNTER — Encounter (HOSPITAL_COMMUNITY): Payer: Self-pay | Admitting: Vascular Surgery

## 2020-10-06 ENCOUNTER — Encounter (HOSPITAL_COMMUNITY)
Admission: EM | Disposition: A | Discharge status: Home Health | Payer: Self-pay | Source: Home / Self Care | Attending: Internal Medicine

## 2020-10-06 DIAGNOSIS — Z95820 Peripheral vascular angioplasty status with implants and grafts: Secondary | ICD-10-CM

## 2020-10-06 DIAGNOSIS — I10 Essential (primary) hypertension: Secondary | ICD-10-CM

## 2020-10-06 DIAGNOSIS — I70229 Atherosclerosis of native arteries of extremities with rest pain, unspecified extremity: Secondary | ICD-10-CM | POA: Diagnosis not present

## 2020-10-06 DIAGNOSIS — I70222 Atherosclerosis of native arteries of extremities with rest pain, left leg: Secondary | ICD-10-CM

## 2020-10-06 DIAGNOSIS — E782 Mixed hyperlipidemia: Secondary | ICD-10-CM | POA: Diagnosis not present

## 2020-10-06 DIAGNOSIS — Z01818 Encounter for other preprocedural examination: Secondary | ICD-10-CM | POA: Diagnosis not present

## 2020-10-06 DIAGNOSIS — Z89422 Acquired absence of other left toe(s): Secondary | ICD-10-CM

## 2020-10-06 HISTORY — PX: ABDOMINAL AORTOGRAM W/LOWER EXTREMITY: CATH118223

## 2020-10-06 LAB — CBC WITH DIFFERENTIAL/PLATELET
Abs Immature Granulocytes: 0.01 10*3/uL (ref 0.00–0.07)
Basophils Absolute: 0 10*3/uL (ref 0.0–0.1)
Basophils Relative: 1 %
Eosinophils Absolute: 0.1 10*3/uL (ref 0.0–0.5)
Eosinophils Relative: 3 %
HCT: 39.8 % (ref 39.0–52.0)
Hemoglobin: 12.8 g/dL — ABNORMAL LOW (ref 13.0–17.0)
Immature Granulocytes: 0 %
Lymphocytes Relative: 39 %
Lymphs Abs: 1.9 10*3/uL (ref 0.7–4.0)
MCH: 28.8 pg (ref 26.0–34.0)
MCHC: 32.2 g/dL (ref 30.0–36.0)
MCV: 89.6 fL (ref 80.0–100.0)
Monocytes Absolute: 0.8 10*3/uL (ref 0.1–1.0)
Monocytes Relative: 17 %
Neutro Abs: 2 10*3/uL (ref 1.7–7.7)
Neutrophils Relative %: 40 %
Platelets: 211 10*3/uL (ref 150–400)
RBC: 4.44 MIL/uL (ref 4.22–5.81)
RDW: 16.6 % — ABNORMAL HIGH (ref 11.5–15.5)
WBC: 4.9 10*3/uL (ref 4.0–10.5)
nRBC: 0 % (ref 0.0–0.2)

## 2020-10-06 LAB — COMPREHENSIVE METABOLIC PANEL
ALT: 20 U/L (ref 0–44)
AST: 21 U/L (ref 15–41)
Albumin: 3.4 g/dL — ABNORMAL LOW (ref 3.5–5.0)
Alkaline Phosphatase: 68 U/L (ref 38–126)
Anion gap: 9 (ref 5–15)
BUN: 19 mg/dL (ref 8–23)
CO2: 23 mmol/L (ref 22–32)
Calcium: 9.1 mg/dL (ref 8.9–10.3)
Chloride: 103 mmol/L (ref 98–111)
Creatinine, Ser: 1.01 mg/dL (ref 0.61–1.24)
GFR, Estimated: >60 mL/min (ref >60)
Glucose, Bld: 92 mg/dL (ref 70–99)
Potassium: 3.6 mmol/L (ref 3.5–5.1)
Sodium: 135 mmol/L (ref 135–145)
Total Bilirubin: 0.7 mg/dL (ref 0.3–1.2)
Total Protein: 6.7 g/dL (ref 6.5–8.1)

## 2020-10-06 LAB — POCT ACTIVATED CLOTTING TIME: Activated Clotting Time: 161 seconds

## 2020-10-06 LAB — GLUCOSE, CAPILLARY
Glucose-Capillary: 154 mg/dL — ABNORMAL HIGH (ref 70–99)
Glucose-Capillary: 113 mg/dL — ABNORMAL HIGH (ref 70–99)
Glucose-Capillary: 119 mg/dL — ABNORMAL HIGH (ref 70–99)
Glucose-Capillary: 104 mg/dL — ABNORMAL HIGH (ref 70–99)
Glucose-Capillary: 120 mg/dL — ABNORMAL HIGH (ref 70–99)

## 2020-10-06 LAB — LIPID PANEL
Cholesterol: 174 mg/dL (ref 0–200)
HDL: 44 mg/dL (ref >40)
LDL Cholesterol: 117 mg/dL — ABNORMAL HIGH (ref 0–99)
Total CHOL/HDL Ratio: 4 RATIO
Triglycerides: 66 mg/dL (ref <150)
VLDL: 13 mg/dL (ref 0–40)

## 2020-10-06 LAB — MAGNESIUM: Magnesium: 2.1 mg/dL (ref 1.7–2.4)

## 2020-10-06 LAB — HEPARIN LEVEL (UNFRACTIONATED): Heparin Unfractionated: 0.77 IU/mL — ABNORMAL HIGH (ref 0.30–0.70)

## 2020-10-06 LAB — HEMOGLOBIN A1C
Hgb A1c MFr Bld: 5.5 % (ref 4.8–5.6)
Mean Plasma Glucose: 111.15 mg/dL

## 2020-10-06 SURGERY — ABDOMINAL AORTOGRAM W/LOWER EXTREMITY
Anesthesia: LOCAL

## 2020-10-06 MED ORDER — FENTANYL CITRATE (PF) 100 MCG/2ML IJ SOLN
INTRAMUSCULAR | Status: AC
  Filled 2020-10-06: qty 2

## 2020-10-06 MED ORDER — ACETAMINOPHEN 325 MG PO TABS: 650.0000 mg | ORAL_TABLET | ORAL | Status: DC | PRN

## 2020-10-06 MED ORDER — LIDOCAINE HCL (PF) 1 % IJ SOLN
INTRAMUSCULAR | Status: DC | PRN
  Administered 2020-10-06: 15 mL

## 2020-10-06 MED ORDER — GABAPENTIN 600 MG PO TABS
1200.0000 mg | ORAL_TABLET | Freq: Every day | ORAL | Status: DC
  Administered 2020-10-06 – 2020-10-08 (×3): 1200 mg via ORAL
  Filled 2020-10-06 (×3): qty 2

## 2020-10-06 MED ORDER — SODIUM CHLORIDE 0.9 % IV SOLN: INTRAVENOUS | Status: DC

## 2020-10-06 MED ORDER — HYDRALAZINE HCL 20 MG/ML IJ SOLN: 5.0000 mg | INTRAMUSCULAR | Status: DC | PRN

## 2020-10-06 MED ORDER — SODIUM CHLORIDE 0.9% FLUSH
3.0000 mL | INTRAVENOUS | Status: DC | PRN
  Administered 2020-10-06: 3 mL via INTRAVENOUS

## 2020-10-06 MED ORDER — SODIUM CHLORIDE 0.9 % IV SOLN: 250.0000 mL | INTRAVENOUS | Status: DC | PRN

## 2020-10-06 MED ORDER — MIDAZOLAM HCL 2 MG/2ML IJ SOLN
INTRAMUSCULAR | Status: AC
  Filled 2020-10-06: qty 2

## 2020-10-06 MED ORDER — GABAPENTIN 600 MG PO TABS
1800.0000 mg | ORAL_TABLET | Freq: Every morning | ORAL | Status: DC
  Administered 2020-10-07 – 2020-10-08 (×2): 1800 mg via ORAL
  Filled 2020-10-06 (×2): qty 3

## 2020-10-06 MED ORDER — HEPARIN (PORCINE) IN NACL 1000-0.9 UT/500ML-% IV SOLN
INTRAVENOUS | Status: DC | PRN
  Administered 2020-10-06 (×2): 500 mL

## 2020-10-06 MED ORDER — LABETALOL HCL 5 MG/ML IV SOLN
10.0000 mg | INTRAVENOUS | Status: DC | PRN
Start: 2020-10-06 — End: 2020-10-09
  Administered 2020-10-09: 5 mg via INTRAVENOUS

## 2020-10-06 MED ORDER — IODIXANOL 320 MG/ML IV SOLN
INTRAVENOUS | Status: DC | PRN
  Administered 2020-10-06: 97 mL

## 2020-10-06 MED ORDER — FENTANYL CITRATE (PF) 100 MCG/2ML IJ SOLN
INTRAMUSCULAR | Status: DC | PRN
  Administered 2020-10-06: 50 ug via INTRAVENOUS

## 2020-10-06 MED ORDER — SODIUM CHLORIDE 0.9% FLUSH
3.0000 mL | Freq: Two times a day (BID) | INTRAVENOUS | Status: DC
  Administered 2020-10-06 – 2020-10-08 (×6): 3 mL via INTRAVENOUS

## 2020-10-06 MED ORDER — INSULIN ASPART 100 UNIT/ML IJ SOLN
0.0000 [IU] | Freq: Three times a day (TID) | INTRAMUSCULAR | Status: DC
  Administered 2020-10-07: 2 [IU] via SUBCUTANEOUS

## 2020-10-06 MED ORDER — HEPARIN SODIUM (PORCINE) 5000 UNIT/ML IJ SOLN
5000.0000 [IU] | Freq: Three times a day (TID) | INTRAMUSCULAR | Status: DC
  Administered 2020-10-06 – 2020-10-08 (×7): 5000 [IU] via SUBCUTANEOUS
  Filled 2020-10-06 (×7): qty 1

## 2020-10-06 MED ORDER — ONDANSETRON HCL 4 MG/2ML IJ SOLN: 4.0000 mg | Freq: Four times a day (QID) | INTRAMUSCULAR | Status: DC | PRN

## 2020-10-06 MED ORDER — LIDOCAINE HCL (PF) 1 % IJ SOLN
INTRAMUSCULAR | Status: AC
  Filled 2020-10-06: qty 30

## 2020-10-06 MED ORDER — SODIUM CHLORIDE 0.9 % IV SOLN: INTRAVENOUS | Status: AC

## 2020-10-06 MED ORDER — HEPARIN (PORCINE) IN NACL 1000-0.9 UT/500ML-% IV SOLN
INTRAVENOUS | Status: AC
  Filled 2020-10-06: qty 1000

## 2020-10-06 MED ORDER — MIDAZOLAM HCL 2 MG/2ML IJ SOLN
INTRAMUSCULAR | Status: DC | PRN
  Administered 2020-10-06: 2 mg via INTRAVENOUS

## 2020-10-06 MED ORDER — GABAPENTIN 600 MG PO TABS: 600.0000 mg | ORAL_TABLET | ORAL | Status: DC

## 2020-10-06 SURGICAL SUPPLY — 8 items
CATH ANGIO 5F PIGTAIL 65CM (CATHETERS) ×1 IMPLANT
KIT MICROPUNCTURE NIT STIFF (SHEATH) ×1 IMPLANT
KIT PV (KITS) ×2 IMPLANT
SHEATH PINNACLE 5F 10CM (SHEATH) ×1 IMPLANT
SYR MEDRAD MARK V 150ML (SYRINGE) ×1 IMPLANT
TRANSDUCER W/STOPCOCK (MISCELLANEOUS) ×2 IMPLANT
TRAY PV CATH (CUSTOM PROCEDURE TRAY) ×2 IMPLANT
WIRE STARTER BENTSON 035X150 (WIRE) ×1 IMPLANT

## 2020-10-06 NOTE — Op Note (Signed)
    Patient name: Jahmarley Cutrer. MRN: [id number] DOB: [birth year] Sex: male  10/06/2020 Pre-operative Diagnosis: Chronic limb threatening ischemia left lower extremity with rest pain Post-operative diagnosis:  Same Surgeon:  Eda Paschal. Donzetta Matters, MD Procedure Performed: 1.  Ultrasound-guided cannulation right common femoral artery 2.  Aortogram with bilateral lower extremity runoff 3.  Moderate sedation with fentanyl and Versed for 20 minutes  Indications: 60 year old male with previous gangrenous changes of left fifth toe which has now been amputated following stenting of his left common external leg arteries in his left SFA.  He is now here with rest pain of his left lower extremity that has been present for 1 week.  He had CT angio which demonstrates severe disease of his left common and external neck arteries as well as his common femoral arteries bilaterally and occluded left SFA stenting.  He is indicated for angiography with possible intervention.  Findings: His aorta is free of flow-limiting stenosis.  Bilateral common iliac arteries are stenosed the left side approximately 80%.  There is a stent crossing his hypogastric artery which does appear to have disease flow-limiting approximately 50%.  There is a stent in the right external leg artery with disease flow-limiting approximately 50%.  Bilateral common femoral arteries are diseased with at least 30% stenosis.  On the right side the SFA is quite diminutive he has runoff in line via the peroneal and posterior tibial arteries.  On the left side is common femoral artery again is disease there is an occluded SFA stent just after 1 cm after the takeoff.  Profunda femoris artery is patent.  He reconstitutes very diminutive popliteal artery on the left at the level of the knee with single-vessel peroneal artery runoff on the left side.  Patient will be scheduled for aortobifemoral bypass for chronic limb threatening ischemia.   Procedure:  The  patient was identified in the holding area and taken to room 8.  The patient was then placed supine on the table and prepped and draped in the usual sterile fashion.  A time out was called.  Ultrasound was used to evaluate the right common femoral artery.  This was disease.  We cannulated with micropuncture needle followed by wire and sheath.  Images saved the permanent record.  We placed a Bentson wire followed by 5 [place] sheath.  We administered moderate sedation with fentanyl and Versed and his vital signs were monitored throughout the case.  Pigtail catheter was placed to the level of L1 aortogram performed.  We then pulled down to the bifurcation performed bilateral lower extremity runoff.  With the above findings we elected for surgical intervention.  Catheter was removed over wire.  Sheath will be pulled in postoperative holding.  He tolerated procedure without any complication.  Contrast: 97cc   Jannelly Bergren C. Donzetta Matters, MD [facility] Office: [phone] Pager: [phone]

## 2020-10-06 NOTE — Progress Notes (Signed)
ANTICOAGULATION CONSULT NOTE - Initial Consult  Pharmacy Consult for Heparin IV Indication: arterial embolus  No Known Allergies  Patient Measurements: Height: '5\' 1"'$  (154.9 cm) Weight: 46 kg (101 lb 6.6 oz) IBW/kg (Calculated) : 52.3 Heparin Dosing Weight: TBW  Vital Signs: Temp: 97.9 F (36.6 C) (09/12 0345) Temp Source: Oral (09/12 0345) BP: 127/78 (09/12 0345) Pulse Rate: 68 (09/12 0345)  Labs: Recent Labs    10/05/20 1712 10/05/20 2007 10/06/20 0537  HGB 15.1  --  12.8*  HCT 46.1  --  39.8  PLT 212  --  211  APTT  --  30  --   LABPROT  --  13.7  --   INR  --  1.1  --   HEPARINUNFRC  --   --  0.77*  CREATININE 1.17  --   --      Estimated Creatinine Clearance: 43.1 mL/min (by C-G formula based on SCr of 1.17 mg/dL).   Medical History: Past Medical History:  Diagnosis Date   Diabetes mellitus without complication ([place])    Hyperlipidemia    Hypertension    Peripheral vascular disease ([place])    Tobacco use     Medications:  Infusions:   heparin 950 Units/hr (10/05/20 2047)   lactated ringers 100 mL/hr at 10/06/20 0048    Assessment: 59 yoM presented to ED with toe pain, foot numbness.  Pharmacy is consulted to dose Heparin IV for arterial embolus, dead leg.    Initial heparin level slightly above goal at 0.77. Hgb trending down from 15.1 to 12.8 this morning. Plt count normal, no bleeding issues noted.   Goal of Therapy:  Heparin level 0.3-0.7 units/ml Monitor platelets by anticoagulation protocol: Yes   Plan:  Decrease heparin IV infusion to 850 units/hr Heparin level 6 hours after rate change Daily heparin level and CBC  Erin Hearing PharmD., BCPS Clinical Pharmacist 10/06/2020 7:03 AM

## 2020-10-06 NOTE — Progress Notes (Signed)
Pt has returned to 4E from cath lab. VSS and placed back on tele. Bedrest til 1615. Call light within reach.  Raelyn Number, RN 12:36 PM 10/06/20

## 2020-10-06 NOTE — Interval H&P Note (Signed)
History and Physical Interval Note:  10/06/2020 10:03 AM  [name].  has presented today for surgery, with the diagnosis of claudication.  The various methods of treatment have been discussed with the patient and family. After consideration of risks, benefits and other options for treatment, the patient has consented to  Procedure(s): LOWER EXTREMITY ANGIOGRAPHY (N/A) as a surgical intervention.  The patient's history has been reviewed, patient examined, no change in status, stable for surgery.  I have reviewed the patient's chart and labs.  Questions were answered to the patient's satisfaction.     Servando Snare

## 2020-10-06 NOTE — Consult Note (Signed)
Cardiology Consultation:   Patient ID: [name]. MRN: [id number]; DOB: 1960/12/20  Admit date: 10/05/2020 Date of Consult: 10/06/2020  PCP:  Cipriano Mile, NP   [facility] Providers Cardiologist:  Evalina Field, MD        Patient Profile:   [name]. is a 60 y.o. male with a hx of peripheral arterial disease/critical limb ischemia who is being seen 10/06/2020 for the evaluation of preoperative clearance at the request of Dr. Donzetta Matters.  History of Present Illness:   [name] is a thin and chronically ill-appearing separated 60 year old  African-American male with no children who does not work because of being disabled.  Is referred by Dr. Audie Box to me for evaluation treatment of critical limb ischemia.    I last saw him in the office 02/27/2020.  The patient was initially referred to our practice by Dr. Daylene Katayama, podiatry.  His risk factors include 20 to 30 pack years of tobacco use having smoked for last 45 years, treated hypertension and hyperlipidemia.  He is never had a heart attack or stroke.  He denies chest pain or shortness of breath.  He apparently had 3 cortisone shots in his right foot 4 to 5 months ago which resulted in wounds at the injection sites which have not healed.  Also complains of claudication bilaterally.  Recent Dopplers performed today revealed a right ABI of 0.60 and a left of 0.45 with occluded SFAs bilaterally.  He does have CKD 3 with serum creatinine in the mid 1 range.   I performed peripheral angiography on him 07/16/2019 revealing occluded SFAs bilaterally with one-vessel runoff.  I performed directional atherectomy followed by drug-coated balloon angioplasty of his right SFA.  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  His right ABI increased from 0.60-0.85.  The wounds on his right foot are still painful and still have not yet to heal.   I attempted to open up his left SFA CTO 08/16/2019 was unsuccessful  reentering the distal.  He also had a partial failure of his right common femoral Perclose.  He was kept overnight.  He still has claudication on the left.  He also continues to smoke.    He continues to smoke several cigarettes a day.  The wound on the dorsal aspect of his right foot still has not healed despite aggressive local wound care and according to Dr. Dellia Nims, he suspects he needs additional blood flow in order to facilitate this.  He still complains of claudication in his left leg from his left SFA CTO which I was unsuccessful in crossing.     He underwent right external iliac artery stenting by Dr. Fletcher Anon 12/19/2019.  His right SFA was patent as was his peroneal.  He did not have a patent anterior tibial at the level of the ankle.  He still complains of left lower extremity claudication but does not have an open wound.  I was unable to reenter the reconstituted distal SFA via the antegrade approach unfortunately retrograde approach would be via his only patent tibial vessel to the foot, his anterior tibial artery.  He was seen by Dr. Donzetta Matters in consult 06/07/2020 and Dr. Trula Slade performed left external iliac artery stenting and left SFA and popliteal stenting.  He was admitted 10/05/2020 for rest left foot pain and underwent abdominal aortography with bifemoral runoff today revealing diffuse aortoiliac disease and an occluded left SFA stent.  We are asked to clear  him preoperatively for aortobifemoral bypass grafting.  He did have a 2D echo performed 07/12/2019 which was essentially normal.  He is never had a functional study.   Past Medical History:  Diagnosis Date   Diabetes mellitus without complication ([place])    Hyperlipidemia    Hypertension    Peripheral vascular disease ([place])    Tobacco use     Past Surgical History:  Procedure Laterality Date   ABDOMINAL AORTOGRAM W/LOWER EXTREMITY Left 07/16/2019   Procedure: ABDOMINAL AORTOGRAM W/LOWER EXTREMITY;  Surgeon: Lorretta Harp, MD;   Location: [place] [facility];  Service: Cardiovascular;  Laterality: Left;   ABDOMINAL AORTOGRAM W/LOWER EXTREMITY Left 08/16/2019   ABDOMINAL AORTOGRAM W/LOWER EXTREMITY N/A 08/16/2019   Procedure: ABDOMINAL AORTOGRAM W/LOWER EXTREMITY;  Surgeon: Lorretta Harp, MD;  Location: [place] [facility];  Service: Cardiovascular;  Laterality: N/A;   ABDOMINAL AORTOGRAM W/LOWER EXTREMITY Bilateral 12/19/2019   Procedure: ABDOMINAL AORTOGRAM W/LOWER EXTREMITY;  Surgeon: Wellington Hampshire, MD;  Location: [place] [facility];  Service: Cardiovascular;  Laterality: Bilateral;   ABDOMINAL AORTOGRAM W/LOWER EXTREMITY Left 06/10/2020   Procedure: ABDOMINAL AORTOGRAM W/LOWER EXTREMITY;  Surgeon: Serafina Mitchell, MD;  Location: [place] [facility];  Service: Cardiovascular;  Laterality: Left;   ABDOMINAL AORTOGRAM W/LOWER EXTREMITY N/A 10/06/2020   Procedure: ABDOMINAL AORTOGRAM W/LOWER EXTREMITY;  Surgeon: Waynetta Sandy, MD;  Location: [place] [facility];  Service: Cardiovascular;  Laterality: N/A;   AMPUTATION TOE Left 06/13/2020   Procedure: Left Fifth Toe Amputation ;  Surgeon: Criselda Peaches, DPM;  Location: [place];  Service: Podiatry;  Laterality: Left;   arm surgery     PERIPHERAL VASCULAR BALLOON ANGIOPLASTY  08/16/2019   Procedure: PERIPHERAL VASCULAR BALLOON ANGIOPLASTY;  Surgeon: Lorretta Harp, MD;  Location: [place] [facility];  Service: Cardiovascular;;  attempted PTA of Left SFA   PERIPHERAL VASCULAR INTERVENTION Right 07/16/2019   Procedure: PERIPHERAL VASCULAR INTERVENTION;  Surgeon: Lorretta Harp, MD;  Location: [place] [facility];  Service: Cardiovascular;  Laterality: Right;   PERIPHERAL VASCULAR INTERVENTION Right 12/19/2019   Procedure: PERIPHERAL VASCULAR INTERVENTION;  Surgeon: Wellington Hampshire, MD;  Location: [place] [facility];  Service: Cardiovascular;  Laterality: Right;  iliac   PERIPHERAL VASCULAR INTERVENTION Left 06/10/2020   Procedure: PERIPHERAL VASCULAR  INTERVENTION;  Surgeon: Serafina Mitchell, MD;  Location: [place] [facility];  Service: Cardiovascular;  Laterality: Left;  SFA /COMMON ILIAC     Home Medications:  Prior to Admission medications   Medication Sig Start Date End Date Taking? Authorizing Provider  aspirin EC 81 MG EC tablet Take 1 tablet (81 mg total) by mouth daily. Swallow whole. Patient taking differently: Take 81 mg by mouth daily at 2 PM. Swallow whole. 07/18/19  Yes Furth, Cadence H, PA-C  atorvastatin (LIPITOR) 40 MG tablet Take 40 mg by mouth daily. 04/06/20  Yes [provider]  clopidogrel (PLAVIX) 75 MG tablet Take 1 tablet (75 mg total) by mouth daily with breakfast. 07/18/19  Yes Furth, Cadence H, PA-C  gabapentin (NEURONTIN) 600 MG tablet Take 1 tablet (600 mg) in morning and afternoon. Take 2 tablets (1200 mg) at night Patient taking differently: Take 600 mg by mouth See admin instructions. Take 3 tablet (1800 mg) in morning and  2 tablets (1200 mg) at night 06/16/20  Yes Lavina Hamman, MD  lisinopril-hydrochlorothiazide (ZESTORETIC) 20-25 MG tablet Take 1 tablet by mouth daily. 04/16/20  Yes [provider]  SANTYL ointment Apply 1  application topically 2 (two) times daily. 08/15/19  Yes [provider]  traMADol (ULTRAM) 50 MG tablet Take 50 mg by mouth every 6 (six) hours as needed for moderate pain. 07/11/20  Yes [provider]  Blood Pressure Monitor KIT Check blood pressure daily Patient taking differently: 1 each by Other route once a week. 10/20/18   Jacelyn Pi, Lilia Argue, MD  docusate sodium (COLACE) 100 MG capsule Take 1 capsule (100 mg total) by mouth 2 (two) times daily. Patient not taking: No sig reported 06/16/20   Lavina Hamman, MD  nicotine (NICODERM CQ - DOSED IN MG/24 HR) 7 mg/24hr patch Place 1 patch (7 mg total) onto the skin daily. Patient not taking: No sig reported 06/17/20   Lavina Hamman, MD  oxyCODONE-acetaminophen (PERCOCET/ROXICET) 5-325 MG tablet Take 1-2  tablets by mouth every 6 (six) hours as needed for moderate pain. Patient not taking: No sig reported 06/16/20   Lavina Hamman, MD    Inpatient Medications: Scheduled Meds:  aspirin EC  81 mg Oral Daily   atorvastatin  40 mg Oral Daily   heparin  5,000 Units Subcutaneous Q8H   lisinopril  20 mg Oral Daily   And   hydrochlorothiazide  25 mg Oral Daily   insulin aspart  0-15 Units Subcutaneous TID AC & HS   sodium chloride flush  3 mL Intravenous Q12H   Continuous Infusions:  sodium chloride 100 mL/hr at 10/06/20 1030   sodium chloride 50 mL/hr at 10/06/20 1146   sodium chloride     lactated ringers 100 mL/hr at 10/06/20 0048   PRN Meds: sodium chloride, acetaminophen, hydrALAZINE, HYDROmorphone (DILAUDID) injection **OR** HYDROmorphone (DILAUDID) injection, labetalol, melatonin, ondansetron **OR** ondansetron (ZOFRAN) IV, polyethylene glycol, sodium chloride flush  Allergies:   No Known Allergies  Social History:   Social History   Socioeconomic History   Marital status: Legally Separated    Spouse name: Not on file   Number of children: Not on file   Years of education: Not on file   Highest education level: Not on file  Occupational History   Not on file  Tobacco Use   Smoking status: Every Day    Packs/day: 0.50    Years: 40.00    Pack years: 20.00    Types: Cigarettes   Smokeless tobacco: Never  Vaping Use   Vaping Use: Never used  Substance and Sexual Activity   Alcohol use: Yes    Comment: beer and liquor daily   Drug use: Yes    Frequency: 3.0 times per week    Types: Marijuana   Sexual activity: Not on file  Other Topics Concern   Not on file  Social History Narrative   Not on file   Social Determinants of Health   Financial Resource Strain: Not on file  Food Insecurity: Not on file  Transportation Needs: Not on file  Physical Activity: Not on file  Stress: Not on file  Social Connections: Not on file  Intimate Partner Violence: Not on file     Family History:    Family History  Problem Relation Age of Onset   Cancer Mother    Diabetes Father      ROS:  Please see the history of present illness.   All other ROS reviewed and negative.     Physical Exam/Data:   Vitals:   10/06/20 1205 10/06/20 1210 10/06/20 1215 10/06/20 1229  BP: 137/76 (!) 143/72 (!) 146/64 (!) 141/67  Pulse: 62  67 64   Resp: '12 11 14 12  ' Temp:    97.6 F (36.4 C)  TempSrc:    Oral  SpO2: 100% 100% 100% 100%  Weight:      Height:        Intake/Output Summary (Last 24 hours) at 10/06/2020 1513 Last data filed at 10/06/2020 0500 Gross per 24 hour  Intake 492.93 ml  Output --  Net 492.93 ml   Last 3 Weights 10/05/2020 07/21/2020 06/06/2020  Weight (lbs) 101 lb 6.6 oz 101 lb 3.2 oz 96 lb 6.4 oz  Weight (kg) 46 kg 45.904 kg 43.727 kg     Body mass index is 19.16 kg/m.  General:  Well nourished, well developed, in no acute distress HEENT: normal Lymph: no adenopathy Neck: no JVD Endocrine:  No thryomegaly Vascular: No carotid bruits; FA pulses 2+ bilaterally without bruits  Cardiac:  normal S1, S2; RRR; no murmur  Lungs:  clear to auscultation bilaterally, no wheezing, rhonchi or rales  Abd: soft, nontender, no hepatomegaly  Ext: no edema Musculoskeletal:  No deformities, BUE and BLE strength normal and equal Skin: warm and dry  Neuro:  CNs 2-12 intact, no focal abnormalities noted Psych:  Normal affect   EKG:  The EKG was personally reviewed and demonstrates: Normal sinus rhythm at 71 with LVH voltage. Telemetry:  Telemetry was personally reviewed and demonstrates: Normal sinus rhythm  Relevant CV Studies: Abdominal aortogram with bifemoral runoff (10/06/2020)  Indications: 60 year old male with previous gangrenous changes of left fifth toe which has now been amputated following stenting of his left common external leg arteries in his left SFA.  He is now here with rest pain of his left lower extremity that has been present for 1 week.   He had CT angio which demonstrates severe disease of his left common and external neck arteries as well as his common femoral arteries bilaterally and occluded left SFA stenting.  He is indicated for angiography with possible intervention.   Findings: His aorta is free of flow-limiting stenosis.  Bilateral common iliac arteries are stenosed the left side approximately 80%.  There is a stent crossing his hypogastric artery which does appear to have disease flow-limiting approximately 50%.  There is a stent in the right external leg artery with disease flow-limiting approximately 50%.  Bilateral common femoral arteries are diseased with at least 30% stenosis.  On the right side the SFA is quite diminutive he has runoff in line via the peroneal and posterior tibial arteries.  On the left side is common femoral artery again is disease there is an occluded SFA stent just after 1 cm after the takeoff.  Profunda femoris artery is patent.  He reconstitutes very diminutive popliteal artery on the left at the level of the knee with single-vessel peroneal artery runoff on the left side.  Patient will be scheduled for aortobifemoral bypass for chronic limb threatening ischemia.   2D echocardiogram (07/12/2019)  1. Left ventricular ejection fraction, by estimation, is 55 to 60%. Left ventricular ejection fraction by 3D volume is 59 %. The left ventricle has normal function. The left ventricle has no regional wall motion abnormalities. Left ventricular diastolic parameters are indeterminate. The average left ventricular global longitudinal strain is -20.4 %. The global longitudinal strain is normal. 2. Right ventricular systolic function is normal. The right ventricular size is normal. Tricuspid regurgitation signal is inadequate for assessing PA pressure. 3. The mitral valve is grossly normal. Mild mitral valve regurgitation. No evidence of mitral stenosis.  4. The aortic valve is tricuspid. Aortic valve  regurgitation is not visualized. No aortic stenosis is present. 5. The inferior vena cava is normal in size with greater than 50% respiratory variability, suggesting right atrial pressure of 3 mmHg. Laboratory Data:  High Sensitivity Troponin:  No results for input(s): TROPONINIHS in the last 720 hours.   Chemistry Recent Labs  Lab 10/05/20 1712 10/06/20 0537  NA 137 135  K 3.7 3.6  CL 101 103  CO2 23 23  GLUCOSE 86 92  BUN 18 19  CREATININE 1.17 1.01  CALCIUM 10.2 9.1  GFRNONAA >60 >60  ANIONGAP 13 9    Recent Labs  Lab 10/06/20 0537  PROT 6.7  ALBUMIN 3.4*  AST 21  ALT 20  ALKPHOS 68  BILITOT 0.7   Hematology Recent Labs  Lab 10/05/20 1712 10/06/20 0537  WBC 5.1 4.9  RBC 5.12 4.44  HGB 15.1 12.8*  HCT 46.1 39.8  MCV 90.0 89.6  MCH 29.5 28.8  MCHC 32.8 32.2  RDW 16.8* 16.6*  PLT 212 211   BNPNo results for input(s): BNP, PROBNP in the last 168 hours.  DDimer No results for input(s): DDIMER in the last 168 hours.   Radiology/Studies:  CT ANGIO AO+BIFEM W & OR WO CONTRAST  Result Date: 10/05/2020 CLINICAL DATA:  Concern for arterial embolism and lower extremity arterial occlusion. EXAM: CT ANGIOGRAPHY OF ABDOMINAL AORTA WITH ILIOFEMORAL RUNOFF TECHNIQUE: Multidetector CT imaging of the abdomen, pelvis and lower extremities was performed using the standard protocol during bolus administration of intravenous contrast. Multiplanar CT image reconstructions and MIPs were obtained to evaluate the vascular anatomy. CONTRAST:  165m OMNIPAQUE IOHEXOL 350 MG/ML SOLN COMPARISON:  None. FINDINGS: VASCULAR Aorta: Moderate atherosclerotic calcification of the abdominal aorta. No aneurysmal dilatation or dissection. No periaortic fluid collection. 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 Celiac: The celiac artery and its major branches are patent. SMA: There is diffuse thickening of the wall of  the proximal SMA with luminal narrowing. There is a focal area of high-grade luminal narrowing in the proximal SMA with greater than 50% luminal narrowing (37/4). The SMA remains patent. Renals: Both renal arteries are patent without evidence of aneurysm, dissection, vasculitis, fibromuscular dysplasia or significant stenosis. IMA: Patent without evidence of aneurysm, dissection, vasculitis or significant stenosis. RIGHT Lower Extremity Inflow: Advanced atherosclerotic calcification of the right common iliac artery with approximately 50% luminal narrowing. A stent is noted extending from the common iliac artery into the right external iliac artery. The stent is patent. There is atherosclerotic calcification of the distal right external iliac artery with luminal narrowing. The external iliac artery however remains patent. There is atherosclerotic calcification of the right internal iliac artery with severe luminal narrowing and diminished flow. Outflow: Atherosclerotic calcification of the common femoral artery with luminal narrowing. The common femoral artery remains patent. The profundus femoris is patent. There is advanced atherosclerotic calcification of the superficial femoral artery with luminal narrowing. The SFA is patent. Popliteal artery is also patent. Runoff: There is minimal flow in the anterior tibial artery. There is reconstitution of flow in the dorsalis pedis artery. The posterior tibial artery and plantar arteries are patent. Very diminished flow in the fibular artery. LEFT Lower Extremity Inflow: There is advanced atherosclerotic calcification of the left common iliac artery with severe luminal narrowing, greater than 50%. A stent is noted in the distal left common iliac artery which appears patent. Advanced  atherosclerotic calcification of the left internal and external iliac arteries. There is minimal flow in the left internal iliac artery. There is moderate narrowing of the left external iliac  artery. The left external iliac artery remains patent. Outflow: There is atherosclerotic calcification and narrowing of the left common femoral artery. The common femoral arteries patent. The profundus femoris is patent. There is a stent in the left superficial femoral artery which is completely occluded. There is atherosclerotic plaque with wall thickening of the left popliteal artery and greater than 50% luminal narrowing. The popliteal artery remains patent but with diminished flow. Runoff: The anterior tibial artery and dorsalis pedis artery are patent. The fibular artery is patent to the mid calf. The posterior tibial artery is not opacified, likely occluded. There is reconstitution of minimal flow in the plantar artery. Veins: No obvious venous abnormality within the limitations of this arterial phase study. Review of the MIP images confirms the above findings. NON-VASCULAR Lower chest: The visualized lung bases are clear. No intra-abdominal free air or free fluid. Hepatobiliary: The liver is unremarkable. No intrahepatic biliary ductal dilatation. The gallbladder is unremarkable. Pancreas: Pancreas is unremarkable as visualized. Spleen: Normal in size without focal abnormality. Adrenals/Urinary Tract: The adrenal glands are unremarkable. The kidneys, visualized ureters, and urinary bladder appear unremarkable. Stomach/Bowel: There is no bowel obstruction or active inflammation. The appendix is normal. Lymphatic: No adenopathy. Reproductive: Enlarged prostate gland. Other: None Musculoskeletal: Degenerative changes of the spine. No acute osseous pathology. IMPRESSION: 1. Advanced atherosclerotic calcification of the aorta and its major branches as described above. There is focal high-grade luminal narrowing of the proximal SMA. 2. Atherosclerotic calcification and narrowing of the iliac arteries. Patent bilateral common iliac artery stents. 3. Complete occlusion of the stent in the left superficial femoral  artery. 4. Patent right posterior tibial artery. Very diminished flow in the anterior tibial artery and fibular artery of the right lower extremity. 5. Patent left anterior tibial artery and dorsalis pedis artery. Non opacification of the left posterior tibial artery. 6. 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 Electronically Signed   By: Anner Crete M.D.   On: 10/05/2020 21:16   PERIPHERAL VASCULAR CATHETERIZATION  Result Date: 10/06/2020 Images from the original result were not included. Patient name: [name]. MRN: [id number] DOB: Jun 17, 1960 Sex: male 10/06/2020 Pre-operative Diagnosis: Chronic limb threatening ischemia left lower extremity with rest pain Post-operative diagnosis:  Same Surgeon:  Eda Paschal. Donzetta Matters, MD Procedure Performed: 1.  Ultrasound-guided cannulation right common femoral artery 2.  Aortogram with bilateral lower extremity runoff 3.  Moderate sedation with fentanyl and Versed for 20 minutes Indications: 60 year old male with previous gangrenous changes of left fifth toe which has now been amputated following stenting of his left common external leg arteries in his left SFA.  He is now here with rest pain of his left lower extremity that has been present for 1 week.  He had CT angio which demonstrates severe disease of his left common and external neck arteries as well as his common femoral arteries bilaterally and occluded left SFA stenting.  He is indicated for angiography with possible intervention. Findings: His aorta is free of flow-limiting stenosis.  Bilateral common iliac arteries are stenosed the left side approximately 80%.  There is a stent crossing his hypogastric artery which does appear to have disease flow-limiting approximately 50%.  There is a stent in the right external leg artery with disease flow-limiting approximately  50%.  Bilateral common femoral arteries are diseased with at least 30%  stenosis.  On the right side the SFA is quite diminutive he has runoff in line via the peroneal and posterior tibial arteries.  On the left side is common femoral artery again is disease there is an occluded SFA stent just after 1 cm after the takeoff.  Profunda femoris artery is patent.  He reconstitutes very diminutive popliteal artery on the left at the level of the knee with single-vessel peroneal artery runoff on the left side. Patient will be scheduled for aortobifemoral bypass for chronic limb threatening ischemia.  Procedure:  The patient was identified in the holding area and taken to room 8.  The patient was then placed supine on the table and prepped and draped in the usual sterile fashion.  A time out was called.  Ultrasound was used to evaluate the right common femoral artery.  This was disease.  We cannulated with micropuncture needle followed by wire and sheath.  Images saved the permanent record.  We placed a Bentson wire followed by 5 [place] sheath.  We administered moderate sedation with fentanyl and Versed and his vital signs were monitored throughout the case.  Pigtail catheter was placed to the level of L1 aortogram performed.  We then pulled down to the bifurcation performed bilateral lower extremity runoff.  With the above findings we elected for surgical intervention.  Catheter was removed over wire.  Sheath will be pulled in postoperative holding.  He tolerated procedure without any complication. Contrast: 97cc Brandon C. Donzetta Matters, MD [facility] Office: [phone] Pager: [phone]   DG Foot Complete Left  Result Date: 10/05/2020 CLINICAL DATA:  Generalized left foot pain, erythema for several days EXAM: LEFT FOOT - COMPLETE 3+ VIEW COMPARISON:  06/13/2020 FINDINGS: Frontal, oblique, and lateral views of the left foot are obtained. Prior amputation of the fifth digit at the level of the metatarsophalangeal joint. No acute or destructive bony lesions. Mild  osteoarthritis of the first metatarsophalangeal joint and throughout the midfoot unchanged. Soft tissues are grossly unremarkable. IMPRESSION: 1. No acute or destructive bony lesions. 2. Stable degenerative changes. 3. Stable prior fifth digit amputation. Electronically Signed   By: Randa Ngo M.D.   On: 10/05/2020 17:28     Assessment and Plan:   Critical limb ischemia-Mr. Sunderlin had an abdominal aortogram with bifemoral runoff today performed by Dr. Donzetta Matters revealing diffuse aortoiliac disease and an occluded left SFA stent.  He is being considered for aortobifemoral bypass grafting for limb salvage.  He did have a 2D echocardiogram performed last year that showed normal LV function but he has not yet had a functional study.  Given his multiple cardiovascular risk factors along with diffuse vascular disease I think it is reasonable to assume he at least is at moderate risk for CV disease and therefore we will order a Lexiscan Myoview stress test to risk stratify. Essential hypertension-blood pressure well controlled on lisinopril Hyperlipidemia-on statin therapy     Risk Assessment/Risk Scores:                For questions or updates, please contact [place] Please consult [email or web address] for contact info under    Signed, Quay Burow, MD  10/06/2020 3:13 PM

## 2020-10-06 NOTE — Progress Notes (Signed)
Site area: rt groin fa sheath Site Prior to Removal:  Level 0 Pressure Applied For: 20 minutes Manual:   yes Patient Status During Pull:  stable Post Pull Site:  Level 0 Post Pull Instructions Given:  yes Post Pull Pulses Present: rt pt dopplered Dressing Applied:  gauze and tegaderm Bedrest begins @ [id number] Comments:

## 2020-10-06 NOTE — Progress Notes (Signed)
Mobility Specialist Progress Note   10/06/20 1817  Mobility  Activity Refused mobility (In too much pain (9/10). Notified RN)    Holland Falling Mobility Specialist Phone Number [phone]

## 2020-10-06 NOTE — Progress Notes (Signed)
PROGRESS NOTE    [name] [employer].   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  CT angiogram of the aorta with iliofemoral runoff revealing a complete occlusion of the stent of the left superficial femoral artery as well as nonopacification of the left posterior tibial artery.  Vascular surgery was consulted.  Patient admitted to [facility] with plan for LLE angiogram and re-vascularization on morning of 10/06/20.  Assessment & Plan   Principal Problem:   Critical lower limb ischemia (HCC) Active Problems:   Essential hypertension   Type 2 diabetes mellitus without complication, without long-term current use of insulin (HCC)   Nicotine dependence, cigarettes, uncomplicated   Mixed hyperlipidemia   Left Lower Extremity Ischemic Limb - Vascular surgery managing. Underwent angiogram 9/12. Plan for aortobifemoral bypass surgery.  PAD - severe, with limb-threatending ischemia. --On ASA, Lipitor --Heparin drip --Hold plavix --Pain control --IV fluids --Risk factor reduction: Complete cessation from all tobacco products. Goal A1c < 7%. BP goal < 140/90 Goal LDL-C <100 mg/dL (<70 if symptomatic from PAD).    Hx of HTN - continue home lisinopril and HCTZ.  PRN hydralazine.  Non-insulin dependent Type 2 diabetes - Sliding scale Novolog.  Peripheral neuropathy - resume gabapentin  Hyperlipidemia - on Lipitor  Nicotine dependence - Counseled on importance  of cessation given his severe vascular disease with smoking a major risk factor.  Patient BMI: Body mass index is 19.16 kg/m.   DVT prophylaxis: heparin injection 5,000 Units Start: 10/06/20 2200   Diet:  Diet Orders (From admission, onward)     Start     Ordered   10/06/20 1228  Diet regular Room service appropriate? Yes; Fluid consistency: Thin  Diet effective now       Question Answer Comment  Room service appropriate? Yes   Fluid consistency: Thin      10/06/20 1228              Code Status: Full Code   Subjective 10/06/20    Pt seen after angio today.  Reports ongoing leg pain, less severe than on admission at this time.  No other acute complaints.    Disposition Plan & Communication   Status is: Inpatient  Remains inpatient appropriate because:Inpatient level of care appropriate due to severity of illness. Vascular Surgery planned.   Dispo: The patient is from: Home              Anticipated d/c is to: Home              Patient currently is not medically stable to d/c.   Difficult to place patient No   Consults, Procedures, Significant Events   Consultants:  Vascular surgery  Procedures:  9/12 Aortogram with bilateral lower extremity runoff  Antimicrobials:  Anti-infectives (From admission, onward)    None         Micro    Objective   Vitals:   10/06/20 1210 10/06/20 1215 10/06/20 1229 10/06/20 1630  BP: (!) 143/72 Marland Kitchen)  146/64 (!) 141/67 119/72  Pulse: 67 64  73  Resp: '11 14 12 16  '$ Temp:   97.6 F (36.4 C) 98 F (36.7 C)  TempSrc:   Oral Oral  SpO2: 100% 100% 100% 100%  Weight:      Height:        Intake/Output Summary (Last 24 hours) at 10/06/2020 1715 Last data filed at 10/06/2020 0500 Gross per 24 hour  Intake 492.93 ml  Output --  Net 492.93 ml   Filed Weights   10/05/20 1641  Weight: 46 kg    Physical Exam:  General exam: awake, alert, no acute distress HEENT: atraumatic, clear conjunctiva, anicteric sclera, moist  mucus membranes, hearing grossly normal  Respiratory system: CTAB, no wheezes, rales or rhonchi, normal respiratory effort. Cardiovascular system: normal S1/S2,  RRR, no JVD, murmurs, rubs, gallops,  no pedal edema.   Gastrointestinal system: soft, NT, ND, no HSM felt, +bowel sounds. Central nervous system: A&O x3. no gross focal neurologic deficits, normal speech Extremities: cool distal LLEmoves all , no edema, normal tone Skin: dry, intact, normal temperature, normal color, No rashes, lesions or ulcers Psychiatry: normal mood, congruent affect, judgement and insight appear normal  Labs   Data Reviewed: I have personally reviewed following labs and imaging studies  CBC: Recent Labs  Lab 10/05/20 1712 10/06/20 0537  WBC 5.1 4.9  NEUTROABS 2.7 2.0  HGB 15.1 12.8*  HCT 46.1 39.8  MCV 90.0 89.6  PLT 212 [id number]   Basic Metabolic Panel: Recent Labs  Lab 10/05/20 1712 10/06/20 0537  NA 137 135  K 3.7 3.6  CL 101 103  CO2 23 23  GLUCOSE 86 92  BUN 18 19  CREATININE 1.17 1.01  CALCIUM 10.2 9.1  MG  --  2.1   GFR: Estimated Creatinine Clearance: 50 mL/min (by C-G formula based on SCr of 1.01 mg/dL). Liver Function Tests: Recent Labs  Lab 10/06/20 0537  AST 21  ALT 20  ALKPHOS 68  BILITOT 0.7  PROT 6.7  ALBUMIN 3.4*   No results for input(s): LIPASE, AMYLASE in the last 168 hours. No results for input(s): AMMONIA in the last 168 hours. Coagulation Profile: Recent Labs  Lab 10/05/20 2007  INR 1.1   Cardiac Enzymes: No results for input(s): CKTOTAL, CKMB, CKMBINDEX, TROPONINI in the last 168 hours. BNP (last 3 results) No results for input(s): PROBNP in the last 8760 hours. HbA1C: Recent Labs    10/06/20 0537  HGBA1C 5.5   CBG: Recent Labs  Lab 10/06/20 0127 10/06/20 0613 10/06/20 1302 10/06/20 1633  GLUCAP 154* 113* 119* 104*   Lipid Profile: Recent Labs    10/06/20 0537  CHOL 174  HDL 44  LDLCALC 117*  TRIG 66  CHOLHDL 4.0   Thyroid  Function Tests: No results for input(s): TSH, T4TOTAL, FREET4, T3FREE, THYROIDAB in the last 72 hours. Anemia Panel: No results for input(s): VITAMINB12, FOLATE, FERRITIN, TIBC, IRON, RETICCTPCT in the last 72 hours. Sepsis Labs: Recent Labs  Lab 10/05/20 1712 10/05/20 1851  LATICACIDVEN 2.6* 2.2*    Recent Results (from the past 240 hour(s))  Blood culture (routine x 2)     Status: None (Preliminary result)   Collection Time: 10/05/20  5:13 PM   Specimen: BLOOD  Result Value Ref Range Status   Specimen Description   Final    BLOOD LEFT ANTECUBITAL Performed at [facility] [address], [place], [place] [address]    Special Requests   Final  BOTTLES DRAWN AEROBIC AND ANAEROBIC Blood Culture adequate volume Performed at [facility] [address] [place], [place] [address]    Culture   Final    NO GROWTH < 24 HOURS Performed at [facility] [address] [place], [place] [address]    Report Status PENDING  Incomplete  Resp Panel by RT-PCR (Flu A&B, Covid) Nasopharyngeal Swab     Status: None   Collection Time: 10/05/20  8:04 PM   Specimen: Nasopharyngeal Swab; Nasopharyngeal(NP) swabs in vial transport medium  Result Value Ref Range Status   SARS Coronavirus 2 by RT PCR NEGATIVE NEGATIVE Final    Comment: (NOTE) SARS-CoV-2 target nucleic acids are NOT DETECTED.  The SARS-CoV-2 RNA is generally detectable in upper respiratory specimens during the acute phase of infection. The lowest concentration of SARS-CoV-2 viral copies this assay can detect is 138 copies/mL. A negative result does not preclude SARS-Cov-2 infection and should not be used as the sole basis for treatment or other patient management decisions. A negative result may occur with  improper specimen collection/handling, submission of specimen other than nasopharyngeal swab, presence of viral mutation(s) within the areas targeted by this assay, and  inadequate number of viral copies(<138 copies/mL). A negative result must be combined with clinical observations, patient history, and epidemiological information. The expected result is Negative.  Fact Sheet for Patients:  EntrepreneurPulse.com.au  Fact Sheet for Healthcare Providers:  IncredibleEmployment.be  This test is no t yet approved or cleared by the [place] FDA and  has been authorized for detection and/or diagnosis of SARS-CoV-2 by FDA under an Emergency Use Authorization (EUA). This EUA will remain  in effect (meaning this test can be used) for the duration of the COVID-19 declaration under Section 564(b)(1) of the Act, 21 U.S.C.section 360bbb-3(b)(1), unless the authorization is terminated  or revoked sooner.       Influenza A by PCR NEGATIVE NEGATIVE Final   Influenza B by PCR NEGATIVE NEGATIVE Final    Comment: (NOTE) The Xpert Xpress SARS-CoV-2/FLU/RSV plus assay is intended as an aid in the diagnosis of influenza from Nasopharyngeal swab specimens and should not be used as a sole basis for treatment. Nasal washings and aspirates are unacceptable for Xpert Xpress SARS-CoV-2/FLU/RSV testing.  Fact Sheet for Patients: EntrepreneurPulse.com.au  Fact Sheet for Healthcare Providers: IncredibleEmployment.be  This test is not yet approved or cleared by the [place] FDA and has been authorized for detection and/or diagnosis of SARS-CoV-2 by FDA under an Emergency Use Authorization (EUA). This EUA will remain in effect (meaning this test can be used) for the duration of the COVID-19 declaration under Section 564(b)(1) of the Act, 21 U.S.C. section 360bbb-3(b)(1), unless the authorization is terminated or revoked.  Performed at [facility], [place] [address], [place], [place] [address]       Imaging Studies   CT ANGIO AO+BIFEM W & OR WO CONTRAST  Result Date:  10/05/2020 CLINICAL DATA:  Concern for arterial embolism and lower extremity arterial occlusion. EXAM: CT ANGIOGRAPHY OF ABDOMINAL AORTA WITH ILIOFEMORAL RUNOFF TECHNIQUE: Multidetector CT imaging of the abdomen, pelvis and lower extremities was performed using the standard protocol during bolus administration of intravenous contrast. Multiplanar CT image reconstructions and MIPs were obtained to evaluate the vascular anatomy. CONTRAST:  165m OMNIPAQUE IOHEXOL 350 MG/ML SOLN COMPARISON:  None. FINDINGS: VASCULAR Aorta: Moderate atherosclerotic calcification of the abdominal aorta. No aneurysmal dilatation or dissection. No periaortic fluid collection. 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 Celiac: The celiac artery and its major branches are patent. SMA: There is diffuse thickening of the wall of the proximal SMA with luminal narrowing. There is a focal area of high-grade luminal narrowing in the proximal SMA with greater than 50% luminal narrowing (37/4). The SMA remains patent. Renals: Both renal arteries are patent without evidence of aneurysm, dissection, vasculitis, fibromuscular dysplasia or significant stenosis. IMA: Patent without evidence of aneurysm, dissection, vasculitis or significant stenosis. RIGHT Lower Extremity Inflow: Advanced atherosclerotic calcification of the right common iliac artery with approximately 50% luminal narrowing. A stent is noted extending from the common iliac artery into the right external iliac artery. The stent is patent. There is atherosclerotic calcification of the distal right external iliac artery with luminal narrowing. The external iliac artery however remains patent. There is atherosclerotic calcification of the right internal iliac artery with severe luminal narrowing and diminished flow. Outflow: Atherosclerotic calcification of the common femoral artery with luminal narrowing. The  common femoral artery remains patent. The profundus femoris is patent. There is advanced atherosclerotic calcification of the superficial femoral artery with luminal narrowing. The SFA is patent. Popliteal artery is also patent. Runoff: There is minimal flow in the anterior tibial artery. There is reconstitution of flow in the dorsalis pedis artery. The posterior tibial artery and plantar arteries are patent. Very diminished flow in the fibular artery. LEFT Lower Extremity Inflow: There is advanced atherosclerotic calcification of the left common iliac artery with severe luminal narrowing, greater than 50%. A stent is noted in the distal left common iliac artery which appears patent. Advanced atherosclerotic calcification of the left internal and external iliac arteries. There is minimal flow in the left internal iliac artery. There is moderate narrowing of the left external iliac artery. The left external iliac artery remains patent. Outflow: There is atherosclerotic calcification and narrowing of the left common femoral artery. The common femoral arteries patent. The profundus femoris is patent. There is a stent in the left superficial femoral artery which is completely occluded. There is atherosclerotic plaque with wall thickening of the left popliteal artery and greater than 50% luminal narrowing. The popliteal artery remains patent but with diminished flow. Runoff: The anterior tibial artery and dorsalis pedis artery are patent. The fibular artery is patent to the mid calf. The posterior tibial artery is not opacified, likely occluded. There is reconstitution of minimal flow in the plantar artery. Veins: No obvious venous abnormality within the limitations of this arterial phase study. Review of the MIP images confirms the above findings. NON-VASCULAR Lower chest: The visualized lung bases are clear. No intra-abdominal free air or free fluid. Hepatobiliary: The liver is unremarkable. No intrahepatic biliary  ductal dilatation. The gallbladder is unremarkable. Pancreas: Pancreas is unremarkable as visualized. Spleen: Normal in size without focal abnormality. Adrenals/Urinary Tract: The adrenal glands are unremarkable. The kidneys, visualized ureters, and urinary bladder appear unremarkable. Stomach/Bowel: There is no bowel obstruction or active inflammation. The appendix is normal. Lymphatic: No adenopathy. Reproductive: Enlarged prostate gland. Other: None Musculoskeletal: Degenerative changes of the spine. No acute osseous pathology. IMPRESSION: 1. Advanced atherosclerotic calcification of the aorta and its major branches as described above. There is focal high-grade luminal narrowing of the proximal SMA. 2. Atherosclerotic calcification and narrowing of the iliac arteries. Patent bilateral common iliac artery stents. 3. Complete occlusion of the stent in the left superficial femoral artery. 4. Patent right posterior tibial artery. Very diminished flow in the  anterior tibial artery and fibular artery of the right lower extremity. 5. Patent left anterior tibial artery and dorsalis pedis artery. Non opacification of the left posterior tibial artery. 6. 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 Electronically Signed   By: Anner Crete M.D.   On: 10/05/2020 21:16   PERIPHERAL VASCULAR CATHETERIZATION  Result Date: 10/06/2020 Images from the original result were not included. Patient name: [name]. MRN: [id number] DOB: 02-Oct-1960 Sex: male 10/06/2020 Pre-operative Diagnosis: Chronic limb threatening ischemia left lower extremity with rest pain Post-operative diagnosis:  Same Surgeon:  Eda Paschal. Donzetta Matters, MD Procedure Performed: 1.  Ultrasound-guided cannulation right common femoral artery 2.  Aortogram with bilateral lower extremity runoff 3.  Moderate sedation with fentanyl and Versed for 20 minutes Indications: 61 year old male with  previous gangrenous changes of left fifth toe which has now been amputated following stenting of his left common external leg arteries in his left SFA.  He is now here with rest pain of his left lower extremity that has been present for 1 week.  He had CT angio which demonstrates severe disease of his left common and external neck arteries as well as his common femoral arteries bilaterally and occluded left SFA stenting.  He is indicated for angiography with possible intervention. Findings: His aorta is free of flow-limiting stenosis.  Bilateral common iliac arteries are stenosed the left side approximately 80%.  There is a stent crossing his hypogastric artery which does appear to have disease flow-limiting approximately 50%.  There is a stent in the right external leg artery with disease flow-limiting approximately 50%.  Bilateral common femoral arteries are diseased with at least 30% stenosis.  On the right side the SFA is quite diminutive he has runoff in line via the peroneal and posterior tibial arteries.  On the left side is common femoral artery again is disease there is an occluded SFA stent just after 1 cm after the takeoff.  Profunda femoris artery is patent.  He reconstitutes very diminutive popliteal artery on the left at the level of the knee with single-vessel peroneal artery runoff on the left side. Patient will be scheduled for aortobifemoral bypass for chronic limb threatening ischemia.  Procedure:  The patient was identified in the holding area and taken to room 8.  The patient was then placed supine on the table and prepped and draped in the usual sterile fashion.  A time out was called.  Ultrasound was used to evaluate the right common femoral artery.  This was disease.  We cannulated with micropuncture needle followed by wire and sheath.  Images saved the permanent record.  We placed a Bentson wire followed by 5 [place] sheath.  We administered moderate sedation with fentanyl and Versed and his  vital signs were monitored throughout the case.  Pigtail catheter was placed to the level of L1 aortogram performed.  We then pulled down to the bifurcation performed bilateral lower extremity runoff.  With the above findings we elected for surgical intervention.  Catheter was removed over wire.  Sheath will be pulled in postoperative holding.  He tolerated procedure without any complication. Contrast: 97cc Brandon C. Donzetta Matters, MD [facility] Office: [phone] Pager: [phone]   DG Foot Complete Left  Result Date: 10/05/2020 CLINICAL DATA:  Generalized left foot pain, erythema for several days EXAM: LEFT FOOT - COMPLETE 3+ VIEW COMPARISON:  06/13/2020 FINDINGS: Frontal, oblique, and lateral views of the  left foot are obtained. Prior amputation of the fifth digit at the level of the metatarsophalangeal joint. No acute or destructive bony lesions. Mild osteoarthritis of the first metatarsophalangeal joint and throughout the midfoot unchanged. Soft tissues are grossly unremarkable. IMPRESSION: 1. No acute or destructive bony lesions. 2. Stable degenerative changes. 3. Stable prior fifth digit amputation. Electronically Signed   By: Randa Ngo M.D.   On: 10/05/2020 17:28     Medications   Scheduled Meds:  aspirin EC  81 mg Oral Daily   atorvastatin  40 mg Oral Daily   heparin  5,000 Units Subcutaneous Q8H   lisinopril  20 mg Oral Daily   And   hydrochlorothiazide  25 mg Oral Daily   insulin aspart  0-15 Units Subcutaneous TID AC & HS   sodium chloride flush  3 mL Intravenous Q12H   Continuous Infusions:  sodium chloride 100 mL/hr at 10/06/20 1030   sodium chloride         LOS: 1 day    Time spent: 30  minutes    Ezekiel Slocumb, DO [facility]  10/06/2020, 5:15 PM      If 7PM-7AM, please contact night-coverage. How to contact the [facility] Attending or Consulting provider [place] or covering provider during after hours [place], for this  patient?    Check the care team in [facility] and look for a) attending/consulting [facility] provider listed and b) the [facility] team listed Log into [email or web address] and use [place]'s universal password to access. If you do not have the password, please contact the hospital operator. Locate the [facility] provider you are looking for under [facility] and page to a number that you can be directly reached. If you still have difficulty reaching the provider, please page the [facility] (Director on Call) for the Hospitalists listed on amion for assistance.

## 2020-10-07 ENCOUNTER — Inpatient Hospital Stay (HOSPITAL_COMMUNITY): Payer: Medicare HMO

## 2020-10-07 DIAGNOSIS — Z01810 Encounter for preprocedural cardiovascular examination: Secondary | ICD-10-CM | POA: Diagnosis not present

## 2020-10-07 DIAGNOSIS — I70222 Atherosclerosis of native arteries of extremities with rest pain, left leg: Secondary | ICD-10-CM

## 2020-10-07 DIAGNOSIS — T82856A Stenosis of peripheral vascular stent, initial encounter: Secondary | ICD-10-CM

## 2020-10-07 DIAGNOSIS — I70229 Atherosclerosis of native arteries of extremities with rest pain, unspecified extremity: Secondary | ICD-10-CM | POA: Diagnosis not present

## 2020-10-07 LAB — CBC
HCT: 37.7 % — ABNORMAL LOW (ref 39.0–52.0)
Hemoglobin: 12.4 g/dL — ABNORMAL LOW (ref 13.0–17.0)
MCH: 29.3 pg (ref 26.0–34.0)
MCHC: 32.9 g/dL (ref 30.0–36.0)
MCV: 89.1 fL (ref 80.0–100.0)
Platelets: 201 10*3/uL (ref 150–400)
RBC: 4.23 MIL/uL (ref 4.22–5.81)
RDW: 16.3 % — ABNORMAL HIGH (ref 11.5–15.5)
WBC: 4.1 10*3/uL (ref 4.0–10.5)
nRBC: 0 % (ref 0.0–0.2)

## 2020-10-07 LAB — GLUCOSE, CAPILLARY
Glucose-Capillary: 100 mg/dL — ABNORMAL HIGH (ref 70–99)
Glucose-Capillary: 146 mg/dL — ABNORMAL HIGH (ref 70–99)
Glucose-Capillary: 139 mg/dL — ABNORMAL HIGH (ref 70–99)
Glucose-Capillary: 107 mg/dL — ABNORMAL HIGH (ref 70–99)
Glucose-Capillary: 164 mg/dL — ABNORMAL HIGH (ref 70–99)

## 2020-10-07 LAB — NM MYOCAR MULTI W/SPECT W/WALL MOTION / EF
MPHR: 159 {beats}/min
Peak HR: 100 {beats}/min
Percent HR: 62 %
Rest HR: 67 {beats}/min
Rest Nuclear Isotope Dose: 10.8 mCi
ST Depression (mm): 0 mm
Stress Nuclear Isotope Dose: 30 mCi

## 2020-10-07 LAB — BASIC METABOLIC PANEL
Anion gap: 9 (ref 5–15)
BUN: 11 mg/dL (ref 8–23)
CO2: 24 mmol/L (ref 22–32)
Calcium: 9.2 mg/dL (ref 8.9–10.3)
Chloride: 100 mmol/L (ref 98–111)
Creatinine, Ser: 1.05 mg/dL (ref 0.61–1.24)
GFR, Estimated: >60 mL/min (ref >60)
Glucose, Bld: 91 mg/dL (ref 70–99)
Potassium: 3.6 mmol/L (ref 3.5–5.1)
Sodium: 133 mmol/L — ABNORMAL LOW (ref 135–145)

## 2020-10-07 MED ORDER — TECHNETIUM TC 99M TETROFOSMIN IV KIT
10.8000 | PACK | Freq: Once | INTRAVENOUS | Status: AC | PRN
  Administered 2020-10-07: 10.8 via INTRAVENOUS

## 2020-10-07 MED ORDER — TECHNETIUM TC 99M TETROFOSMIN IV KIT
30.0000 | PACK | Freq: Once | INTRAVENOUS | Status: AC | PRN
  Administered 2020-10-07: 30 via INTRAVENOUS

## 2020-10-07 MED ORDER — REGADENOSON 0.4 MG/5ML IV SOLN
INTRAVENOUS | Status: AC
  Administered 2020-10-07: 0.4 mg via INTRAVENOUS
  Filled 2020-10-07: qty 5

## 2020-10-07 MED ORDER — ATORVASTATIN CALCIUM 80 MG PO TABS
80.0000 mg | ORAL_TABLET | Freq: Every day | ORAL | Status: DC
  Administered 2020-10-08: 80 mg via ORAL
  Filled 2020-10-07: qty 1

## 2020-10-07 MED ORDER — REGADENOSON 0.4 MG/5ML IV SOLN
0.4000 mg | Freq: Once | INTRAVENOUS | Status: AC
  Filled 2020-10-07: qty 5

## 2020-10-07 NOTE — Progress Notes (Signed)
Mobility Specialist Progress Note   10/07/20 1810  Mobility  Activity Ambulated in hall  Level of Assistance Modified independent, requires aide device or extra time  Assistive Device Front wheel walker  Distance Ambulated (ft) 220 ft  Mobility Ambulated independently in hallway  Mobility Response Tolerated well  Mobility performed by Mobility specialist  $Mobility charge 1 Mobility   Pt received supine in bed. Pt c/o 8/10 pain in R foot. Independent for bed mobility and MinA sit <> stand. Verbal cues for upright posture with RW. Returned back to bed w/ c/o 9/10 pain. Call bell was left by side and RN notified.    Pre Mobility: 74 HR, 143/79 BP, 100% SpO2 During Mobility: 98 HR  Post Mobility: 76 HR, 147/77 BP, 100% SpO2  Holland Falling Mobility Specialist Phone Number [phone]

## 2020-10-07 NOTE — Progress Notes (Signed)
Cardiology Progress Note  Patient ID: [name]. MRN: [id number] DOB: [birth year] Date of Encounter: 10/07/2020  Primary Cardiologist: Evalina Field, MD  Subjective   Chief Complaint: Left leg pain  HPI: Low risk nuclear medicine stress test.  Continues to have left leg pain.  Denies any chest pain or trouble breathing.  ROS:  All other ROS reviewed and negative. Pertinent positives noted in the HPI.     Inpatient Medications  Scheduled Meds:  aspirin EC  81 mg Oral Daily   atorvastatin  40 mg Oral Daily   gabapentin  1,800 mg Oral q morning   And   gabapentin  1,200 mg Oral QHS   heparin  5,000 Units Subcutaneous Q8H   lisinopril  20 mg Oral Daily   And   hydrochlorothiazide  25 mg Oral Daily   insulin aspart  0-15 Units Subcutaneous TID AC & HS   sodium chloride flush  3 mL Intravenous Q12H   Continuous Infusions:  sodium chloride 100 mL/hr at 10/06/20 1030   sodium chloride     PRN Meds: sodium chloride, acetaminophen, hydrALAZINE, HYDROmorphone (DILAUDID) injection **OR** HYDROmorphone (DILAUDID) injection, labetalol, melatonin, ondansetron **OR** ondansetron (ZOFRAN) IV, polyethylene glycol, sodium chloride flush   Vital Signs   Vitals:   10/07/20 1025 10/07/20 1027 10/07/20 1029 10/07/20 1240  BP: 123/76 105/67 (!) 102/54 (!) 157/84  Pulse: 88 100 92 96  Resp:    19  Temp:    97.9 F (36.6 C)  TempSrc:    Oral  SpO2:      Weight:      Height:        Intake/Output Summary (Last 24 hours) at 10/07/2020 1339 Last data filed at 10/07/2020 1136 Gross per 24 hour  Intake 120 ml  Output 1350 ml  Net -1230 ml   Last 3 Weights 10/05/2020 07/21/2020 06/06/2020  Weight (lbs) 101 lb 6.6 oz 101 lb 3.2 oz 96 lb 6.4 oz  Weight (kg) 46 kg 45.904 kg 43.727 kg      Telemetry  Overnight telemetry shows sinus rhythm in the 80s beat per minute range, which I personally reviewed.   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   Physical Exam   Vitals:   10/07/20 1025 10/07/20 1027 10/07/20 1029 10/07/20 1240  BP: 123/76 105/67 (!) 102/54 (!) 157/84  Pulse: 88 100 92 96  Resp:    19  Temp:    97.9 F (36.6 C)  TempSrc:    Oral  SpO2:      Weight:      Height:        Intake/Output Summary (Last 24 hours) at 10/07/2020 1339 Last data filed at 10/07/2020 1136 Gross per 24 hour  Intake 120 ml  Output 1350 ml  Net -1230 ml    Last 3 Weights 10/05/2020 07/21/2020 06/06/2020  Weight (lbs) 101 lb 6.6 oz 101 lb 3.2 oz 96 lb 6.4 oz  Weight (kg) 46 kg 45.904 kg 43.727 kg    Body mass index is 19.16 kg/m.  General: Well nourished, well developed, in no acute distress Head: Atraumatic, normal size  Eyes: PEERLA, EOMI  Neck: Supple, no JVD Endocrine: No thryomegaly Cardiac: Normal S1, S2; RRR; no murmurs, rubs, or gallops Lungs: Clear to auscultation bilaterally, no wheezing, rhonchi or rales  Abd: Soft, nontender, no hepatomegaly  Ext:  Absent pulses in the lower extremities Musculoskeletal: No deformities, BUE and BLE strength normal and equal Skin: Warm and dry, no rashes   Neuro: Alert and oriented to person, place, time, and situation, CNII-XII grossly intact, no focal deficits  Psych: Normal mood and affect   Labs  High Sensitivity Troponin:  No results for input(s): TROPONINIHS in the last 720 hours.   Cardiac EnzymesNo results for input(s): TROPONINI in the last 168 hours. No results for input(s): TROPIPOC in the last 168 hours.  Chemistry Recent Labs  Lab 10/05/20 1712 10/06/20 0537 10/07/20 0143  NA 137 135 133*  K 3.7 3.6 3.6  CL 101 103 100  CO2 '23 23 24  '$ GLUCOSE 86 92 91  BUN '18 19 11  '$ CREATININE 1.17 1.01 1.05  CALCIUM 10.2 9.1 9.2  PROT  --  6.7  --   ALBUMIN  --  3.4*  --   AST  --  21  --   ALT  --  20  --   ALKPHOS  --  68  --   BILITOT  --  0.7  --   GFRNONAA >60 >60 >60   ANIONGAP '13 9 9    '$ Hematology Recent Labs  Lab 10/05/20 1712 10/06/20 0537 10/07/20 0143  WBC 5.1 4.9 4.1  RBC 5.12 4.44 4.23  HGB 15.1 12.8* 12.4*  HCT 46.1 39.8 37.7*  MCV 90.0 89.6 89.1  MCH 29.5 28.8 29.3  MCHC 32.8 32.2 32.9  RDW 16.8* 16.6* 16.3*  PLT 212 211 201   BNPNo results for input(s): BNP, PROBNP in the last 168 hours.  DDimer No results for input(s): DDIMER in the last 168 hours.   Radiology  CT ANGIO AO+BIFEM W & OR WO CONTRAST  Result Date: 10/05/2020 CLINICAL DATA:  Concern for arterial embolism and lower extremity arterial occlusion. EXAM: CT ANGIOGRAPHY OF ABDOMINAL AORTA WITH ILIOFEMORAL RUNOFF TECHNIQUE: Multidetector CT imaging of the abdomen, pelvis and lower extremities was performed using the standard protocol during bolus administration of intravenous contrast. Multiplanar CT image reconstructions and MIPs were obtained to evaluate the vascular anatomy. CONTRAST:  144m OMNIPAQUE IOHEXOL 350 MG/ML SOLN COMPARISON:  None. FINDINGS: VASCULAR Aorta: Moderate atherosclerotic calcification of the abdominal aorta. No aneurysmal dilatation or dissection. No periaortic fluid collection. 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 Celiac: The celiac artery and its major branches are patent. SMA: There is diffuse thickening of the wall of the proximal SMA with luminal narrowing. There is a focal area of high-grade luminal narrowing in the proximal SMA with greater than 50% luminal narrowing (37/4). The SMA remains patent. Renals: Both renal arteries are patent without evidence of aneurysm, dissection, vasculitis, fibromuscular dysplasia or significant stenosis. IMA: Patent without evidence of aneurysm, dissection, vasculitis or significant stenosis. RIGHT Lower Extremity Inflow: Advanced atherosclerotic calcification of the right common iliac artery with approximately 50% luminal narrowing. A  stent is noted extending from the common iliac artery into the right external iliac artery. The stent is patent. There is atherosclerotic calcification of the distal right external iliac artery with luminal narrowing. The external iliac artery however remains patent. There is atherosclerotic calcification of the right internal iliac artery with severe luminal narrowing and diminished flow. Outflow: Atherosclerotic calcification of the common femoral artery with luminal narrowing. The common femoral artery remains patent. The profundus femoris is patent. There is advanced atherosclerotic calcification of the superficial femoral artery with luminal narrowing. The  SFA is patent. Popliteal artery is also patent. Runoff: There is minimal flow in the anterior tibial artery. There is reconstitution of flow in the dorsalis pedis artery. The posterior tibial artery and plantar arteries are patent. Very diminished flow in the fibular artery. LEFT Lower Extremity Inflow: There is advanced atherosclerotic calcification of the left common iliac artery with severe luminal narrowing, greater than 50%. A stent is noted in the distal left common iliac artery which appears patent. Advanced atherosclerotic calcification of the left internal and external iliac arteries. There is minimal flow in the left internal iliac artery. There is moderate narrowing of the left external iliac artery. The left external iliac artery remains patent. Outflow: There is atherosclerotic calcification and narrowing of the left common femoral artery. The common femoral arteries patent. The profundus femoris is patent. There is a stent in the left superficial femoral artery which is completely occluded. There is atherosclerotic plaque with wall thickening of the left popliteal artery and greater than 50% luminal narrowing. The popliteal artery remains patent but with diminished flow. Runoff: The anterior tibial artery and dorsalis pedis artery are patent.  The fibular artery is patent to the mid calf. The posterior tibial artery is not opacified, likely occluded. There is reconstitution of minimal flow in the plantar artery. Veins: No obvious venous abnormality within the limitations of this arterial phase study. Review of the MIP images confirms the above findings. NON-VASCULAR Lower chest: The visualized lung bases are clear. No intra-abdominal free air or free fluid. Hepatobiliary: The liver is unremarkable. No intrahepatic biliary ductal dilatation. The gallbladder is unremarkable. Pancreas: Pancreas is unremarkable as visualized. Spleen: Normal in size without focal abnormality. Adrenals/Urinary Tract: The adrenal glands are unremarkable. The kidneys, visualized ureters, and urinary bladder appear unremarkable. Stomach/Bowel: There is no bowel obstruction or active inflammation. The appendix is normal. Lymphatic: No adenopathy. Reproductive: Enlarged prostate gland. Other: None Musculoskeletal: Degenerative changes of the spine. No acute osseous pathology. IMPRESSION: 1. Advanced atherosclerotic calcification of the aorta and its major branches as described above. There is focal high-grade luminal narrowing of the proximal SMA. 2. Atherosclerotic calcification and narrowing of the iliac arteries. Patent bilateral common iliac artery stents. 3. Complete occlusion of the stent in the left superficial femoral artery. 4. Patent right posterior tibial artery. Very diminished flow in the anterior tibial artery and fibular artery of the right lower extremity. 5. Patent left anterior tibial artery and dorsalis pedis artery. Non opacification of the left posterior tibial artery. 6. 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 Electronically Signed   By: Anner Crete M.D.   On: 10/05/2020 21:16   PERIPHERAL VASCULAR CATHETERIZATION  Result Date: 10/06/2020 Images from the original result were not  included. Patient name: [name]. MRN: [id number] DOB: 04/08/1960 Sex: male 10/06/2020 Pre-operative Diagnosis: Chronic limb threatening ischemia left lower extremity with rest pain Post-operative diagnosis:  Same Surgeon:  Eda Paschal. Donzetta Matters, MD Procedure Performed: 1.  Ultrasound-guided cannulation right common femoral artery 2.  Aortogram with bilateral lower extremity runoff 3.  Moderate sedation with fentanyl and Versed for 20 minutes Indications: 60 year old male with previous gangrenous changes of left fifth toe which has now been amputated following stenting of his left common external leg arteries in his left SFA.  He is now here with rest pain of his left lower extremity that has been present for 1 week.  He had CT angio which demonstrates severe disease of his  left common and external neck arteries as well as his common femoral arteries bilaterally and occluded left SFA stenting.  He is indicated for angiography with possible intervention. Findings: His aorta is free of flow-limiting stenosis.  Bilateral common iliac arteries are stenosed the left side approximately 80%.  There is a stent crossing his hypogastric artery which does appear to have disease flow-limiting approximately 50%.  There is a stent in the right external leg artery with disease flow-limiting approximately 50%.  Bilateral common femoral arteries are diseased with at least 30% stenosis.  On the right side the SFA is quite diminutive he has runoff in line via the peroneal and posterior tibial arteries.  On the left side is common femoral artery again is disease there is an occluded SFA stent just after 1 cm after the takeoff.  Profunda femoris artery is patent.  He reconstitutes very diminutive popliteal artery on the left at the level of the knee with single-vessel peroneal artery runoff on the left side. Patient will be scheduled for aortobifemoral bypass for chronic limb threatening ischemia.  Procedure:  The patient was  identified in the holding area and taken to room 8.  The patient was then placed supine on the table and prepped and draped in the usual sterile fashion.  A time out was called.  Ultrasound was used to evaluate the right common femoral artery.  This was disease.  We cannulated with micropuncture needle followed by wire and sheath.  Images saved the permanent record.  We placed a Bentson wire followed by 5 [place] sheath.  We administered moderate sedation with fentanyl and Versed and his vital signs were monitored throughout the case.  Pigtail catheter was placed to the level of L1 aortogram performed.  We then pulled down to the bifurcation performed bilateral lower extremity runoff.  With the above findings we elected for surgical intervention.  Catheter was removed over wire.  Sheath will be pulled in postoperative holding.  He tolerated procedure without any complication. Contrast: 97cc Brandon C. Donzetta Matters, MD [facility] [place] Office: [phone] Pager: [phone]   NM Myocar Multi W/Spect Tamela Oddi Motion / EF  Result Date: 10/07/2020   Findings are consistent with no ischemia. The study is low risk.   No ST deviation was noted.   LV perfusion is abnormal. There is no evidence of ischemia. Defect 1: There is a medium defect with mild reduction in uptake present in the apical to basal inferior, inferolateral and apex location(s) that is fixed. There is normal wall motion in the defect area. Consistent with artifact caused by bowel tracer uptake.   Left ventricular function is normal. The left ventricular ejection fraction is normal (55-65%). End diastolic cavity size is normal. End systolic cavity size is normal. Significant artifact from extracardiac/bowel activity, but images improve with stress. Not consistent with ischemia. Low risk study.   DG Foot Complete Left  Result Date: 10/05/2020 CLINICAL DATA:  Generalized left foot pain, erythema for several days EXAM: LEFT FOOT -  COMPLETE 3+ VIEW COMPARISON:  06/13/2020 FINDINGS: Frontal, oblique, and lateral views of the left foot are obtained. Prior amputation of the fifth digit at the level of the metatarsophalangeal joint. No acute or destructive bony lesions. Mild osteoarthritis of the first metatarsophalangeal joint and throughout the midfoot unchanged. Soft tissues are grossly unremarkable. IMPRESSION: 1. No acute or destructive bony lesions. 2. Stable degenerative changes. 3. Stable prior fifth digit amputation. Electronically Signed   By: Diana Eves.D.  On: 10/05/2020 17:28    Cardiac Studies  TTE 07/12/2019   1. Left ventricular ejection fraction, by estimation, is 55 to 60%. Left  ventricular ejection fraction by 3D volume is 59 %. The left ventricle has  normal function. The left ventricle has no regional wall motion  abnormalities. Left ventricular diastolic   parameters are indeterminate. The average left ventricular global  longitudinal strain is -20.4 %. The global longitudinal strain is normal.   2. Right ventricular systolic function is normal. The right ventricular  size is normal. Tricuspid regurgitation signal is inadequate for assessing  PA pressure.   3. The mitral valve is grossly normal. Mild mitral valve regurgitation.  No evidence of mitral stenosis.   4. The aortic valve is tricuspid. Aortic valve regurgitation is not  visualized. No aortic stenosis is present.   5. The inferior vena cava is normal in size with greater than 50%  respiratory variability, suggesting right atrial pressure of 3 mmHg.   Patient Profile  [name]. is a 60 y.o. male with PAD, tobacco abuse, diabetes who was admitted on 10/05/2020 with critical limb ischemia.  Cardiology was consulted on 10/06/2020 for preoperative assessment.  Stress Myoview was a low risk study.  Assessment & Plan   #Preoperative risk assessment -Here with critical limb ischemia.  Nuclear medicine stress test this morning shows  no evidence of ischemia.  This is a low risk study.  Significant gut artifact. -Echocardiogram last year shows normal LV function. -EKG without acute ischemic changes. -He informed me he has no cardiac symptoms. -Given the severity of his disease I would recommend to proceed with surgery.  It is reassuring that his nuclear medicine stress test is low risk.  Cardiology will be available as needed. -We will continue aspirin 81 mg daily.  Continue home blood pressure medications.  I have increased his Lipitor to 80 mg daily.  [facility] will sign off.   Medication Recommendations: Medications as above Other recommendations (labs, testing, etc): None Follow up as an outpatient: He may keep regular follow-up with [place] as an outpatient.  For questions or updates, please contact [place] Please consult [email or web address] for contact info under   Time Spent with Patient: I have spent a total of 35 minutes with patient reviewing hospital notes, telemetry, EKGs, labs and examining the patient as well as establishing an assessment and plan that was discussed with the patient.  > 50% of time was spent in direct patient care.    Signed, Addison Naegeli. Audie Box, MD, [place]  10/07/2020 1:39 PM

## 2020-10-07 NOTE — Progress Notes (Addendum)
  Progress Note    10/07/2020 7:39 AM 1 Day Post-Op  Subjective:  L foot rest pain; no pain from R groin cath site   Vitals:   10/07/20 0359 10/07/20 0733  BP: 111/67 (!) 107/58  Pulse: 76 61  Resp: 16 17  Temp: 97.6 F (36.4 C) 98 F (36.7 C)  SpO2: 98% 95%   Physical Exam Lungs:  non labored Incisions:  R groin cath site without firm hematoma Abdomen:  soft Neurologic: A&O  CBC    Component Value Date/Time   WBC 4.1 10/07/2020 0143   RBC 4.23 10/07/2020 0143   HGB 12.4 (L) 10/07/2020 0143   HGB 11.7 (L) 12/12/2019 1006   HCT 37.7 (L) 10/07/2020 0143   HCT 36.2 (L) 12/12/2019 1006   PLT 201 10/07/2020 0143   PLT 249 12/12/2019 1006   MCV 89.1 10/07/2020 0143   MCV 90 12/12/2019 1006   MCH 29.3 10/07/2020 0143   MCHC 32.9 10/07/2020 0143   RDW 16.3 (H) 10/07/2020 0143   RDW 14.5 12/12/2019 1006   LYMPHSABS 1.9 10/06/2020 0537   LYMPHSABS 1.5 07/31/2019 1401   MONOABS 0.8 10/06/2020 0537   EOSABS 0.1 10/06/2020 0537   EOSABS 0.2 07/31/2019 1401   BASOSABS 0.0 10/06/2020 0537   BASOSABS 0.0 07/31/2019 1401    BMET    Component Value Date/Time   NA 133 (L) 10/07/2020 0143   NA 136 12/12/2019 1006   K 3.6 10/07/2020 0143   CL 100 10/07/2020 0143   CO2 24 10/07/2020 0143   GLUCOSE 91 10/07/2020 0143   BUN 11 10/07/2020 0143   BUN 18 12/12/2019 1006   CREATININE 1.05 10/07/2020 0143   CALCIUM 9.2 10/07/2020 0143   GFRNONAA >60 10/07/2020 0143   GFRAA 82 12/12/2019 1006    INR    Component Value Date/Time   INR 1.1 10/05/2020 2007     Intake/Output Summary (Last 24 hours) at 10/07/2020 0739 Last data filed at 10/07/2020 0600 Gross per 24 hour  Intake 120 ml  Output 850 ml  Net -730 ml     Assessment/Plan:  60 y.o. male with critical limb ischemia LLE 1 Day Post-Op   Plan is for aortobifemoral bypass on 9/15 with Dr. Stanford Breed pending cardiac clearance NPO this morning for stress test R groin cath site without firm hematoma   Dagoberto Ligas, PA-C [facility] [phone] 10/07/2020 7:39 AM  VASCULAR STAFF ADDENDUM: I have independently interviewed and examined the patient. I agree with the above.  Reviewed CT and angiogram in detail yesterday with Dr. Donzetta Matters. 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 Given his young age and lack of comorbidities, I think an aortobifemoral bypass would be best for him.  We reviewed the risks, benefits, and alternatives to ABF in detail. I explained the low, but real risk of death from this surgery. We also reviewed more common complications including: stroke, pneumonia, MI, renal injury.  He is understanding and wants to proceed with ABF. Appreciate Dr. Kennon Holter evaluation. Will await Lexiscan.   [name], MD [facility] Phone Number: [phone] 10/07/2020 8:07 AM

## 2020-10-07 NOTE — Progress Notes (Signed)
PROGRESS NOTE    [name].   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  CT angiogram of the aorta with iliofemoral runoff revealing a complete occlusion of the stent of the left superficial femoral artery as well as nonopacification of the left posterior tibial artery.  Vascular surgery was consulted.  Patient admitted to [facility] with plan for LLE angiogram and re-vascularization on morning of 10/06/20.  Assessment & Plan   Principal Problem:   Critical lower limb ischemia (HCC) Active Problems:   Essential hypertension   Type 2 diabetes mellitus without complication, without long-term current use of insulin (HCC)   Nicotine dependence, cigarettes, uncomplicated   Mixed hyperlipidemia   Left Lower Extremity Ischemic Limb - Vascular surgery managing. Underwent angiogram 9/12. Plan for aortobifemoral bypass surgery.  Pre-op Risk Assessment per Cardiology. Myoview NM stress 9/13 -No ischemia seen, low risk study.   PAD - severe, with limb-threatending ischemia. --On ASA, Lipitor --Heparin drip --Hold plavix --Pain control --IV fluids --Risk factor reduction: Complete cessation from all tobacco products. Goal A1c < 7%. BP goal < 140/90 Goal LDL-C <100 mg/dL (<70 if symptomatic from PAD).    Hx of HTN - continue home lisinopril and HCTZ.  PRN hydralazine.  Non-insulin dependent Type 2 diabetes - Sliding scale Novolog.  Peripheral  neuropathy - resume gabapentin  Hyperlipidemia - on Lipitor  Nicotine dependence - Counseled on importance of cessation given his severe vascular disease with smoking a major risk factor.  Patient BMI: Body mass index is 19.16 kg/m.   DVT prophylaxis: heparin injection 5,000 Units Start: 10/06/20 2200   Diet:  Diet Orders (From admission, onward)     Start     Ordered   10/06/20 1228  Diet regular Room service appropriate? Yes; Fluid consistency: Thin  Diet effective now       Question Answer Comment  Room service appropriate? Yes   Fluid consistency: Thin      10/06/20 1228              Code Status: Full Code   Subjective 10/07/20    Pt seen after stress test today, ordering lunch.  Reports feeling well.  Leg pain controlled right now.  Denies any other acute complaints.    Disposition Plan & Communication   Status is: Inpatient  Remains inpatient appropriate because:Inpatient level of care appropriate due to severity of illness. Vascular Surgery planned.   Dispo: The patient is from: Home              Anticipated d/c is to: Home              Patient currently is not medically stable to d/c.   Difficult to place patient No   Consults, Procedures, Significant Events   Consultants:  Vascular surgery  Procedures:  9/12 Aortogram with bilateral lower extremity runoff  Antimicrobials:  Anti-infectives (From admission, onward)    None         Micro    Objective  Vitals:   10/07/20 1027 10/07/20 1029 10/07/20 1240 10/07/20 1500  BP: 105/67 (!) 102/54 (!) 157/84 (!) 159/66  Pulse: 100 92 96 79  Resp:   19 12  Temp:   97.9 F (36.6 C) 99.3 F (37.4 C)  TempSrc:   Oral Oral  SpO2:      Weight:      Height:        Intake/Output Summary (Last 24 hours) at 10/07/2020 1558 Last data filed at 10/07/2020 1136 Gross per 24 hour  Intake 120 ml  Output 1350 ml  Net -1230 ml   Filed Weights   10/05/20 1641  Weight: 46 kg    Physical  Exam:  General exam: awake, alert, no acute distress Respiratory system: normal respiratory effort. Cardiovascular system: normal S1/S2,  RRR   Gastrointestinal system: soft, non-tender Central nervous system: A&O x3. no gross focal neurologic deficits, normal speech Extremities: cool distal LLE, no edema, normal tone Psych: normal mood, congruent affect   Labs   Data Reviewed: I have personally reviewed following labs and imaging studies  CBC: Recent Labs  Lab 10/05/20 1712 10/06/20 0537 10/07/20 0143  WBC 5.1 4.9 4.1  NEUTROABS 2.7 2.0  --   HGB 15.1 12.8* 12.4*  HCT 46.1 39.8 37.7*  MCV 90.0 89.6 89.1  PLT 212 211 [id number]   Basic Metabolic Panel: Recent Labs  Lab 10/05/20 1712 10/06/20 0537 10/07/20 0143  NA 137 135 133*  K 3.7 3.6 3.6  CL 101 103 100  CO2 '23 23 24  '$ GLUCOSE 86 92 91  BUN '18 19 11  '$ CREATININE 1.17 1.01 1.05  CALCIUM 10.2 9.1 9.2  MG  --  2.1  --    GFR: Estimated Creatinine Clearance: 48.1 mL/min (by C-G formula based on SCr of 1.05 mg/dL). Liver Function Tests: Recent Labs  Lab 10/06/20 0537  AST 21  ALT 20  ALKPHOS 68  BILITOT 0.7  PROT 6.7  ALBUMIN 3.4*   No results for input(s): LIPASE, AMYLASE in the last 168 hours. No results for input(s): AMMONIA in the last 168 hours. Coagulation Profile: Recent Labs  Lab 10/05/20 2007  INR 1.1   Cardiac Enzymes: No results for input(s): CKTOTAL, CKMB, CKMBINDEX, TROPONINI in the last 168 hours. BNP (last 3 results) No results for input(s): PROBNP in the last 8760 hours. HbA1C: Recent Labs    10/06/20 0537  HGBA1C 5.5   CBG: Recent Labs  Lab 10/06/20 1633 10/06/20 2136 10/07/20 0604 10/07/20 1135 10/07/20 1347  GLUCAP 104* 120* 100* 146* 139*   Lipid Profile: Recent Labs    10/06/20 0537  CHOL 174  HDL 44  LDLCALC 117*  TRIG 66  CHOLHDL 4.0   Thyroid Function Tests: No results for input(s): TSH, T4TOTAL, FREET4, T3FREE, THYROIDAB in the last 72 hours. Anemia  Panel: No results for input(s): VITAMINB12, FOLATE, FERRITIN, TIBC, IRON, RETICCTPCT in the last 72 hours. Sepsis Labs: Recent Labs  Lab 10/05/20 1712 10/05/20 1851  LATICACIDVEN 2.6* 2.2*    Recent Results (from the past 240 hour(s))  Blood culture (routine x 2)     Status: None (Preliminary result)   Collection Time: 10/05/20  5:13 PM   Specimen: BLOOD  Result Value Ref Range Status   Specimen Description   Final    BLOOD LEFT ANTECUBITAL Performed at [facility] [address] [place], [place] [address]    Special Requests   Final    BOTTLES DRAWN AEROBIC AND ANAEROBIC Blood Culture  adequate volume Performed at [facility] [address], [place], [place] [address]    Culture   Final    NO GROWTH 2 DAYS Performed at [facility] [address], [place], [place] [address]    Report Status PENDING  Incomplete  Blood culture (routine x 2)     Status: None (Preliminary result)   Collection Time: 10/05/20  8:01 PM   Specimen: BLOOD  Result Value Ref Range Status   Specimen Description   Final    BLOOD LEFT ANTECUBITAL Performed at [facility] [address], [place], [place] [address]    Special Requests   Final    BOTTLES DRAWN AEROBIC AND ANAEROBIC Blood Culture adequate volume Performed at [facility] [address] [place], [place] [address]    Culture   Final    NO GROWTH 1 DAY Performed at [facility], [place] [address] [place], [place] [address]    Report Status PENDING  Incomplete  Resp Panel by RT-PCR (Flu A&B, Covid) Nasopharyngeal Swab     Status: None   Collection Time: 10/05/20  8:04 PM   Specimen: Nasopharyngeal Swab; Nasopharyngeal(NP) swabs in vial transport medium  Result Value Ref Range Status   SARS Coronavirus 2 by RT PCR NEGATIVE NEGATIVE Final    Comment: (NOTE) SARS-CoV-2 target nucleic acids are NOT DETECTED.  The SARS-CoV-2 RNA is generally  detectable in upper respiratory specimens during the acute phase of infection. The lowest concentration of SARS-CoV-2 viral copies this assay can detect is 138 copies/mL. A negative result does not preclude SARS-Cov-2 infection and should not be used as the sole basis for treatment or other patient management decisions. A negative result may occur with  improper specimen collection/handling, submission of specimen other than nasopharyngeal swab, presence of viral mutation(s) within the areas targeted by this assay, and inadequate number of viral copies(<138 copies/mL). A negative result must be combined with clinical observations, patient history, and epidemiological information. The expected result is Negative.  Fact Sheet for Patients:  EntrepreneurPulse.com.au  Fact Sheet for Healthcare Providers:  IncredibleEmployment.be  This test is no t yet approved or cleared by the [place] FDA and  has been authorized for detection and/or diagnosis of SARS-CoV-2 by FDA under an Emergency Use Authorization (EUA). This EUA will remain  in effect (meaning this test can be used) for the duration of the COVID-19 declaration under Section 564(b)(1) of the Act, 21 U.S.C.section 360bbb-3(b)(1), unless the authorization is terminated  or revoked sooner.       Influenza A by PCR NEGATIVE NEGATIVE Final   Influenza B by PCR NEGATIVE NEGATIVE Final    Comment: (NOTE) The Xpert Xpress SARS-CoV-2/FLU/RSV plus assay is intended as an aid in the diagnosis of influenza from Nasopharyngeal swab specimens and should not be used as a sole basis for treatment. Nasal washings and aspirates are unacceptable for Xpert Xpress SARS-CoV-2/FLU/RSV testing.  Fact Sheet for Patients: EntrepreneurPulse.com.au  Fact Sheet for Healthcare Providers: IncredibleEmployment.be  This test is not yet approved or cleared by the [place] FDA  and has been authorized for detection and/or diagnosis of SARS-CoV-2 by FDA under an Emergency Use Authorization (EUA). This EUA will remain in effect (meaning this test can be used) for the duration of the COVID-19 declaration under Section 564(b)(1) of the Act, 21 U.S.C. section 360bbb-3(b)(1), unless the authorization is terminated or revoked.  Performed at [facility], [address] [place],  [place] [address]       Imaging Studies   CT ANGIO AO+BIFEM W & OR WO CONTRAST  Result Date: 10/05/2020 CLINICAL DATA:  Concern for arterial embolism and lower extremity arterial occlusion. EXAM: CT ANGIOGRAPHY OF ABDOMINAL AORTA WITH ILIOFEMORAL RUNOFF TECHNIQUE: Multidetector CT imaging of the abdomen, pelvis and lower extremities was performed using the standard protocol during bolus administration of intravenous contrast. Multiplanar CT image reconstructions and MIPs were obtained to evaluate the vascular anatomy. CONTRAST:  115m OMNIPAQUE IOHEXOL 350 MG/ML SOLN COMPARISON:  None. FINDINGS: VASCULAR Aorta: Moderate atherosclerotic calcification of the abdominal aorta. No aneurysmal dilatation or dissection. No periaortic fluid collection. 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 Celiac: The celiac artery and its major branches are patent. SMA: There is diffuse thickening of the wall of the proximal SMA with luminal narrowing. There is a focal area of high-grade luminal narrowing in the proximal SMA with greater than 50% luminal narrowing (37/4). The SMA remains patent. Renals: Both renal arteries are patent without evidence of aneurysm, dissection, vasculitis, fibromuscular dysplasia or significant stenosis. IMA: Patent without evidence of aneurysm, dissection, vasculitis or significant stenosis. RIGHT Lower Extremity Inflow: Advanced atherosclerotic calcification of the right common iliac artery  with approximately 50% luminal narrowing. A stent is noted extending from the common iliac artery into the right external iliac artery. The stent is patent. There is atherosclerotic calcification of the distal right external iliac artery with luminal narrowing. The external iliac artery however remains patent. There is atherosclerotic calcification of the right internal iliac artery with severe luminal narrowing and diminished flow. Outflow: Atherosclerotic calcification of the common femoral artery with luminal narrowing. The common femoral artery remains patent. The profundus femoris is patent. There is advanced atherosclerotic calcification of the superficial femoral artery with luminal narrowing. The SFA is patent. Popliteal artery is also patent. Runoff: There is minimal flow in the anterior tibial artery. There is reconstitution of flow in the dorsalis pedis artery. The posterior tibial artery and plantar arteries are patent. Very diminished flow in the fibular artery. LEFT Lower Extremity Inflow: There is advanced atherosclerotic calcification of the left common iliac artery with severe luminal narrowing, greater than 50%. A stent is noted in the distal left common iliac artery which appears patent. Advanced atherosclerotic calcification of the left internal and external iliac arteries. There is minimal flow in the left internal iliac artery. There is moderate narrowing of the left external iliac artery. The left external iliac artery remains patent. Outflow: There is atherosclerotic calcification and narrowing of the left common femoral artery. The common femoral arteries patent. The profundus femoris is patent. There is a stent in the left superficial femoral artery which is completely occluded. There is atherosclerotic plaque with wall thickening of the left popliteal artery and greater than 50% luminal narrowing. The popliteal artery remains patent but with diminished flow. Runoff: The anterior tibial  artery and dorsalis pedis artery are patent. The fibular artery is patent to the mid calf. The posterior tibial artery is not opacified, likely occluded. There is reconstitution of minimal flow in the plantar artery. Veins: No obvious venous abnormality within the limitations of this arterial phase study. Review of the MIP images confirms the above findings. NON-VASCULAR Lower chest: The visualized lung bases are clear. No intra-abdominal free air or free fluid. Hepatobiliary: The liver is unremarkable. No intrahepatic biliary ductal dilatation. The gallbladder is unremarkable. Pancreas: Pancreas is unremarkable as visualized.  Spleen: Normal in size without focal abnormality. Adrenals/Urinary Tract: The adrenal glands are unremarkable. The kidneys, visualized ureters, and urinary bladder appear unremarkable. Stomach/Bowel: There is no bowel obstruction or active inflammation. The appendix is normal. Lymphatic: No adenopathy. Reproductive: Enlarged prostate gland. Other: None Musculoskeletal: Degenerative changes of the spine. No acute osseous pathology. IMPRESSION: 1. Advanced atherosclerotic calcification of the aorta and its major branches as described above. There is focal high-grade luminal narrowing of the proximal SMA. 2. Atherosclerotic calcification and narrowing of the iliac arteries. Patent bilateral common iliac artery stents. 3. Complete occlusion of the stent in the left superficial femoral artery. 4. Patent right posterior tibial artery. Very diminished flow in the anterior tibial artery and fibular artery of the right lower extremity. 5. Patent left anterior tibial artery and dorsalis pedis artery. Non opacification of the left posterior tibial artery. 6. 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 Electronically Signed   By: Anner Crete M.D.   On: 10/05/2020 21:16   PERIPHERAL VASCULAR CATHETERIZATION  Result Date:  10/06/2020 Images from the original result were not included. Patient name: [name]. MRN: [id number] DOB: 08/31/1960 Sex: male 10/06/2020 Pre-operative Diagnosis: Chronic limb threatening ischemia left lower extremity with rest pain Post-operative diagnosis:  Same Surgeon:  Eda Paschal. Donzetta Matters, MD Procedure Performed: 1.  Ultrasound-guided cannulation right common femoral artery 2.  Aortogram with bilateral lower extremity runoff 3.  Moderate sedation with fentanyl and Versed for 20 minutes Indications: 60 year old male with previous gangrenous changes of left fifth toe which has now been amputated following stenting of his left common external leg arteries in his left SFA.  He is now here with rest pain of his left lower extremity that has been present for 1 week.  He had CT angio which demonstrates severe disease of his left common and external neck arteries as well as his common femoral arteries bilaterally and occluded left SFA stenting.  He is indicated for angiography with possible intervention. Findings: His aorta is free of flow-limiting stenosis.  Bilateral common iliac arteries are stenosed the left side approximately 80%.  There is a stent crossing his hypogastric artery which does appear to have disease flow-limiting approximately 50%.  There is a stent in the right external leg artery with disease flow-limiting approximately 50%.  Bilateral common femoral arteries are diseased with at least 30% stenosis.  On the right side the SFA is quite diminutive he has runoff in line via the peroneal and posterior tibial arteries.  On the left side is common femoral artery again is disease there is an occluded SFA stent just after 1 cm after the takeoff.  Profunda femoris artery is patent.  He reconstitutes very diminutive popliteal artery on the left at the level of the knee with single-vessel peroneal artery runoff on the left side. Patient will be scheduled for aortobifemoral bypass for chronic limb  threatening ischemia.  Procedure:  The patient was identified in the holding area and taken to room 8.  The patient was then placed supine on the table and prepped and draped in the usual sterile fashion.  A time out was called.  Ultrasound was used to evaluate the right common femoral artery.  This was disease.  We cannulated with micropuncture needle followed by wire and sheath.  Images saved the permanent record.  We placed a Bentson wire followed by 5 [place] sheath.  We administered moderate sedation with fentanyl and Versed and his  vital signs were monitored throughout the case.  Pigtail catheter was placed to the level of L1 aortogram performed.  We then pulled down to the bifurcation performed bilateral lower extremity runoff.  With the above findings we elected for surgical intervention.  Catheter was removed over wire.  Sheath will be pulled in postoperative holding.  He tolerated procedure without any complication. Contrast: 97cc Brandon C. Donzetta Matters, MD [facility] Office: [phone] Pager: [phone]   NM Myocar Multi W/Spect Tamela Oddi Motion / EF  Result Date: 10/07/2020   Findings are consistent with no ischemia. The study is low risk.   No ST deviation was noted.   LV perfusion is abnormal. There is no evidence of ischemia. Defect 1: There is a medium defect with mild reduction in uptake present in the apical to basal inferior, inferolateral and apex location(s) that is fixed. There is normal wall motion in the defect area. Consistent with artifact caused by bowel tracer uptake.   Left ventricular function is normal. The left ventricular ejection fraction is normal (55-65%). End diastolic cavity size is normal. End systolic cavity size is normal. Significant artifact from extracardiac/bowel activity, but images improve with stress. Not consistent with ischemia. Low risk study.   DG Foot Complete Left  Result Date: 10/05/2020 CLINICAL DATA:  Generalized left foot  pain, erythema for several days EXAM: LEFT FOOT - COMPLETE 3+ VIEW COMPARISON:  06/13/2020 FINDINGS: Frontal, oblique, and lateral views of the left foot are obtained. Prior amputation of the fifth digit at the level of the metatarsophalangeal joint. No acute or destructive bony lesions. Mild osteoarthritis of the first metatarsophalangeal joint and throughout the midfoot unchanged. Soft tissues are grossly unremarkable. IMPRESSION: 1. No acute or destructive bony lesions. 2. Stable degenerative changes. 3. Stable prior fifth digit amputation. Electronically Signed   By: Randa Ngo M.D.   On: 10/05/2020 17:28     Medications   Scheduled Meds:  aspirin EC  81 mg Oral Daily   [START ON 10/08/2020] atorvastatin  80 mg Oral Daily   gabapentin  1,800 mg Oral q morning   And   gabapentin  1,200 mg Oral QHS   heparin  5,000 Units Subcutaneous Q8H   lisinopril  20 mg Oral Daily   And   hydrochlorothiazide  25 mg Oral Daily   insulin aspart  0-15 Units Subcutaneous TID AC & HS   sodium chloride flush  3 mL Intravenous Q12H   Continuous Infusions:  sodium chloride 100 mL/hr at 10/06/20 1030   sodium chloride         LOS: 2 days    Time spent: 25  minutes    Ezekiel Slocumb, DO [facility]  10/07/2020, 3:58 PM      If 7PM-7AM, please contact night-coverage. How to contact the [facility] Attending or Consulting provider [place] or covering provider during after hours [place], for this patient?    Check the care team in [facility] and look for a) attending/consulting [facility] provider listed and b) the [facility] team listed Log into [email or web address] and use [place]'s universal password to access. If you do not have the password, please contact the hospital operator. Locate the [facility] provider you are looking for under [facility] and page to a number that you can be directly reached. If you still have difficulty reaching the provider, please page the [facility] (Director on Call) for the Hospitalists  listed on amion for assistance.

## 2020-10-08 DIAGNOSIS — E119 Type 2 diabetes mellitus without complications: Secondary | ICD-10-CM | POA: Diagnosis not present

## 2020-10-08 DIAGNOSIS — F17210 Nicotine dependence, cigarettes, uncomplicated: Secondary | ICD-10-CM | POA: Diagnosis not present

## 2020-10-08 DIAGNOSIS — T82856A Stenosis of peripheral vascular stent, initial encounter: Secondary | ICD-10-CM | POA: Diagnosis not present

## 2020-10-08 DIAGNOSIS — I70222 Atherosclerosis of native arteries of extremities with rest pain, left leg: Secondary | ICD-10-CM | POA: Diagnosis not present

## 2020-10-08 DIAGNOSIS — I70229 Atherosclerosis of native arteries of extremities with rest pain, unspecified extremity: Secondary | ICD-10-CM | POA: Diagnosis not present

## 2020-10-08 DIAGNOSIS — I10 Essential (primary) hypertension: Secondary | ICD-10-CM | POA: Diagnosis not present

## 2020-10-08 LAB — BASIC METABOLIC PANEL
Anion gap: 10 (ref 5–15)
BUN: 11 mg/dL (ref 8–23)
CO2: 29 mmol/L (ref 22–32)
Calcium: 10 mg/dL (ref 8.9–10.3)
Chloride: 97 mmol/L — ABNORMAL LOW (ref 98–111)
Creatinine, Ser: 1.07 mg/dL (ref 0.61–1.24)
GFR, Estimated: >60 mL/min (ref >60)
Glucose, Bld: 81 mg/dL (ref 70–99)
Potassium: 5 mmol/L (ref 3.5–5.1)
Sodium: 136 mmol/L (ref 135–145)

## 2020-10-08 LAB — CBC
HCT: 42.1 % (ref 39.0–52.0)
Hemoglobin: 13.4 g/dL (ref 13.0–17.0)
MCH: 28.9 pg (ref 26.0–34.0)
MCHC: 31.8 g/dL (ref 30.0–36.0)
MCV: 90.7 fL (ref 80.0–100.0)
Platelets: 234 10*3/uL (ref 150–400)
RBC: 4.64 MIL/uL (ref 4.22–5.81)
RDW: 16 % — ABNORMAL HIGH (ref 11.5–15.5)
WBC: 5.1 10*3/uL (ref 4.0–10.5)
nRBC: 0 % (ref 0.0–0.2)

## 2020-10-08 LAB — GLUCOSE, CAPILLARY
Glucose-Capillary: 92 mg/dL (ref 70–99)
Glucose-Capillary: 109 mg/dL — ABNORMAL HIGH (ref 70–99)
Glucose-Capillary: 157 mg/dL — ABNORMAL HIGH (ref 70–99)
Glucose-Capillary: 151 mg/dL — ABNORMAL HIGH (ref 70–99)

## 2020-10-08 LAB — SURGICAL PCR SCREEN
MRSA, PCR: NEGATIVE
Staphylococcus aureus: NEGATIVE

## 2020-10-08 MED ORDER — CEFAZOLIN SODIUM-DEXTROSE 1-4 GM/50ML-% IV SOLN
1.0000 g | INTRAVENOUS | Status: AC
  Administered 2020-10-09: 1 g via INTRAVENOUS

## 2020-10-08 MED ORDER — OXYCODONE-ACETAMINOPHEN 5-325 MG PO TABS
1.0000 | ORAL_TABLET | ORAL | Status: DC | PRN
  Administered 2020-10-08 – 2020-10-09 (×4): 2 via ORAL
  Filled 2020-10-08 (×5): qty 2

## 2020-10-08 NOTE — Progress Notes (Signed)
  Progress Note    10/08/2020 8:20 AM Hospital Day 3  Subjective:  c/o pain  Afebrile HR 60's-90's NSR [id number] systolic [id number] RA  Vitals:   10/08/20 0505 10/08/20 0750  BP: 108/72 140/88  Pulse: 80 90  Resp: 16 14  Temp: 98.3 F (36.8 C) 98.3 F (36.8 C)  SpO2: 98% 98%    Physical Exam: General:   resting - c/o pain Lungs:  non labored   CBC    Component Value Date/Time   WBC 5.1 10/08/2020 0213   RBC 4.64 10/08/2020 0213   HGB 13.4 10/08/2020 0213   HGB 11.7 (L) 12/12/2019 1006   HCT 42.1 10/08/2020 0213   HCT 36.2 (L) 12/12/2019 1006   PLT 234 10/08/2020 0213   PLT 249 12/12/2019 1006   MCV 90.7 10/08/2020 0213   MCV 90 12/12/2019 1006   MCH 28.9 10/08/2020 0213   MCHC 31.8 10/08/2020 0213   RDW 16.0 (H) 10/08/2020 0213   RDW 14.5 12/12/2019 1006   LYMPHSABS 1.9 10/06/2020 0537   LYMPHSABS 1.5 07/31/2019 1401   MONOABS 0.8 10/06/2020 0537   EOSABS 0.1 10/06/2020 0537   EOSABS 0.2 07/31/2019 1401   BASOSABS 0.0 10/06/2020 0537   BASOSABS 0.0 07/31/2019 1401    BMET    Component Value Date/Time   NA 136 10/08/2020 0213   NA 136 12/12/2019 1006   K 5.0 10/08/2020 0213   CL 97 (L) 10/08/2020 0213   CO2 29 10/08/2020 0213   GLUCOSE 81 10/08/2020 0213   BUN 11 10/08/2020 0213   BUN 18 12/12/2019 1006   CREATININE 1.07 10/08/2020 0213   CALCIUM 10.0 10/08/2020 0213   GFRNONAA >60 10/08/2020 0213   GFRAA 82 12/12/2019 1006    INR    Component Value Date/Time   INR 1.1 10/05/2020 2007     Intake/Output Summary (Last 24 hours) at 10/08/2020 0820 Last data filed at 10/08/2020 0752 Gross per 24 hour  Intake 1183.33 ml  Output 950 ml  Net 233.33 ml     Assessment/Plan:  60 y.o. male with CLI scheduled for aortobifemoral bypass grafting tomorrow with Dr. Dorothea Ogle Day 3  -pt nuclear medicine stress test reveals pt is low risk for surgery and he has normal LV function.   -continue with plan for ABF bypass tomorrow.  Npo after  MN/consent/labs in am.     Leontine Locket, PA-C [facility] [phone] 10/08/2020 8:20 AM

## 2020-10-08 NOTE — Care Management Important Message (Signed)
Important Message  Patient Details  Name: [name]. MRN: [id number] Date of Birth: 07-01-1960   Medicare Important Message Given:  Yes     [name] 10/08/2020, 3:48 PM

## 2020-10-08 NOTE — TOC Initial Note (Signed)
Transition of Care (TOC) - Initial/Assessment Note    Patient Details  Name: [name]. MRN: [id number] Date of Birth: 15-Jan-1961  Transition of Care [facility]) CM/SW Contact:    Carles Collet, RN Phone Number: 10/08/2020, 1:52 PM  Clinical Narrative:        60 y.o. male with CLI scheduled for aortobifemoral bypass grafting tomorrow with Dr. Arta Silence w patient to discuss disposition resources and needs.  He is from with a friend whom he states is able to provide support after DC.   He had [facility] services over the summer. He states he was pleased with it and if needed would want to use them again. Spoke w [place] liaison who is agreeable to services if needed after DC. Will await PT OT assessment and follow up w [facility] agency.  No other needs identified at this time. TOC will continue to follow             Expected Discharge Plan: [place] Barriers to Discharge: Continued Medical Work up   Patient Goals and CMS Choice   CMS Medicare.gov Compare Post Acute Care list provided to:: Patient Choice offered to / list presented to : Patient  Expected Discharge Plan and Services Expected Discharge Plan: [place]   Discharge Planning Services: CM Consult   Living arrangements for the past 2 months: Apartment                                      Prior Living Arrangements/Services Living arrangements for the past 2 months: Apartment Lives with:: Friends, Self                   Activities of Daily Living Home Assistive Devices/Equipment: Radio producer (specify quad or straight) ADL Screening (condition at time of admission) Patient's cognitive ability adequate to safely complete daily activities?: Yes Is the patient deaf or have difficulty hearing?: No Does the patient have difficulty seeing, even when wearing glasses/contacts?: No Does the patient have difficulty concentrating, remembering, or making decisions?: No Patient  able to express need for assistance with ADLs?: Yes Does the patient have difficulty dressing or bathing?: No Independently performs ADLs?: Yes (appropriate for developmental age) Does the patient have difficulty walking or climbing stairs?: Yes Weakness of Legs: Both Weakness of Arms/Hands: None  Permission Sought/Granted                  Emotional Assessment              Admission diagnosis:  Critical lower limb ischemia ([place]) [I70.229] Patient Active Problem List   Diagnosis Date Noted   Nicotine dependence, cigarettes, uncomplicated [id number]   Mixed hyperlipidemia 10/05/2020   Protein-calorie malnutrition, severe 06/07/2020   Osteomyelitis of fifth toe of left foot ([place]) 06/07/2020   Tobacco use    Toe infection 06/06/2020   PVD (peripheral vascular disease) ([place]) 12/19/2019   Claudication in peripheral vascular disease ([place]) 08/16/2019   Critical ischemia of foot ([place]) 07/16/2019   Critical lower limb ischemia ([place]) 07/10/2019   Pure hypercholesterolemia 10/13/2018   Type 2 diabetes mellitus without complication, without long-term current use of insulin ([place]) 10/13/2018   Tobacco use disorder 10/13/2018   Essential hypertension 10/23/2009   PCP:  Cipriano Mile, NP Pharmacy:   [facility] [place], [place] - [address]  [place] [address] Phone: [phone] Fax: [phone]     Social Determinants of Health (SDOH) Interventions    Readmission Risk Interventions Readmission Risk Prevention Plan 06/16/2020  Post Dischage Appt Complete  Medication Screening Complete  Transportation Screening Complete  Some recent data might be hidden

## 2020-10-08 NOTE — Progress Notes (Signed)
Mobility Specialist Progress Note   10/08/20 1445  Mobility  Activity Dangled on edge of bed;Sat and stood x 3 ((x15 STS) & (x5 ea. knee extension w/ resistance))  Level of Assistance Modified independent, requires aide device or extra time  Assistive Device Front wheel walker  Distance Ambulated (ft) 2 ft  Mobility Sit up in bed/chair position for meals  Mobility Response Tolerated well  Mobility performed by Mobility specialist  $Mobility charge 1 Mobility   Pt received sleep in bed. C/o being in 8/10 pain and didn't want to walk. Encouraged pt in an alternative mobility session and they agreed. Pt mod independent for whole session. Session included x15 STS and x5 knee extensions w/ resistance. Pt returned back supine in bed w/ call bell by side. Pt stated they were feeling better w/ a pain scale of 7/10.   Holland Falling Mobility Specialist Phone Number [phone]

## 2020-10-08 NOTE — Anesthesia Preprocedure Evaluation (Addendum)
Anesthesia Evaluation  Patient identified by MRN, date of birth, ID band Patient awake    Reviewed: Allergy & Precautions, NPO status , Patient's Chart, lab work & pertinent test results  History of Anesthesia Complications Negative for: history of anesthetic complications  Airway Mallampati: II  TM Distance: >3 FB Neck ROM: Full    Dental  (+) Edentulous Upper, Edentulous Lower   Pulmonary Current Smoker and Patient abstained from smoking.,    Pulmonary exam normal        Cardiovascular hypertension, Pt. on medications + Peripheral Vascular Disease  Normal cardiovascular exam   '22 Myoperfusion - .  Findings are consistent with no ischemia. The study is low risk. .  No ST deviation was noted. .  LV perfusion is abnormal. There is no evidence of ischemia. Defect 1: There is a medium defect with mild reduction in uptake present in the apical to basal inferior, inferolateral and apex location(s) that is fixed. There is normal wall motion in the defect area. Consistent with artifact caused by bowel tracer uptake. Marland Kitchen  Left ventricular function is normal. The left ventricular ejection fraction is normal (55-65%). End diastolic cavity size is normal. End systolic cavity size is normal.   Neuro/Psych negative neurological ROS  negative psych ROS   GI/Hepatic negative GI ROS, (+)     substance abuse  marijuana use,   Endo/Other  diabetes  Renal/GU negative Renal ROS     Musculoskeletal negative musculoskeletal ROS (+)   Abdominal   Peds  Hematology  On plavix    Anesthesia Other Findings   Reproductive/Obstetrics                            Anesthesia Physical Anesthesia Plan  ASA: 3  Anesthesia Plan: General   Post-op Pain Management:    Induction: Intravenous  PONV Risk Score and Plan: 2 and Treatment may vary due to age or medical condition, Ondansetron, Dexamethasone and  Midazolam  Airway Management Planned: Oral ETT  Additional Equipment: Arterial line, CVP and Ultrasound Guidance Line Placement  Intra-op Plan:   Post-operative Plan: Extubation in OR  Informed Consent: I have reviewed the patients History and Physical, chart, labs and discussed the procedure including the risks, benefits and alternatives for the proposed anesthesia with the patient or authorized representative who has indicated his/her understanding and acceptance.     Dental advisory given  Plan Discussed with: CRNA and Anesthesiologist  Anesthesia Plan Comments:        Anesthesia Quick Evaluation

## 2020-10-08 NOTE — Progress Notes (Addendum)
PROGRESS NOTE    [name].   [id number]  DOB: Jan 03, 1961  DOA: 10/05/2020 PCP: Cipriano Mile, NP   Brief Narrative:  [name]. a 59 year old male with diabetes mellitus type 2, peripheral artery disease with history of right SFA arthrectomy and balloon angioplasty 6/21, stenting of left SFA and left CIA 5/22, right fifth toe amputation 5/22 for osteomyelitis, hyperlipidemia, nicotine dependence, hypertension, marijuana use. The patient presented to the ED with severe left leg pain which he said was progressive over the past 2 weeks.  CTA of aorta with iliofemoral runoff revealed a complete occlusion of the stent in the left superficial femoral artery and nonopacification of the left posterior tibial artery.  Vascular surgery consult was requested.   Subjective: Continues to have pain in the left foot.  He has no other complaints at this time.    Assessment & Plan:   Principal Problem:   Critical lower limb ischemia, peripheral artery disease Mild lactic acidosis - Lactic acid level noted to be 2.2 on 10/05/2020 -He has been taking Lipitor aspirin and Plavix at home - Continue heparin infusion, aspirin and Lipitor-Plavix is on hold - Continue pain control - Plan for aortobifemoral bypass on 9/15 with Dr. Stanford Breed -Cardiology clearance requested by vascular surgery-the patient underwent a nuclear stress test which is a low risk study-2D echo shows a normal LV function-- - Risk factor modifications discussed with patient  Active Problems:    Type 2 diabetes mellitus with peripheral neuropathy -Continue sliding scale NovoLog and gabapentin - Hemoglobin A1C    Component Value Date/Time   HGBA1C 5.5 10/06/2020 0537      Nicotine dependence, cigarettes, uncomplicated -Has been counseled on quitting smoking especially in light of his current extensive vascular issues  Hypertension - BP has been low to normal-hold lisinopril/HCTZ in the perioperative  period-continue as needed hydralazine   Time spent in minutes: 35 DVT prophylaxis: heparin Code Status: Full code Family Communication:  Level of Care: Level of care: Telemetry Medical Disposition Plan:  Status is: Inpatient  Remains inpatient appropriate because:Inpatient level of care appropriate due to severity of illness  Dispo: The patient is from: Home              Anticipated d/c is to: Home              Patient currently is not medically stable to d/c.   Difficult to place patient No  Consultants:  Vascular surgery Cardiology  Procedures:  Nuclear stress test Aortogram with bilateral lower extremity runoff Antimicrobials:  Anti-infectives (From admission, onward)    Start     Dose/Rate Route Frequency Ordered Stop   10/09/20 0600  ceFAZolin (ANCEF) IVPB 1 g/50 mL premix        1 g 100 mL/hr over 30 Minutes Intravenous To Short Stay 10/08/20 0825 10/10/20 0600        Objective: Vitals:   10/07/20 2352 10/08/20 0505 10/08/20 0750 10/08/20 1152  BP: (!) 147/93 108/72 140/88 129/87  Pulse: 90 80 90 90  Resp: '13 16 14 16  '$ Temp: 98 F (36.7 C) 98.3 F (36.8 C) 98.3 F (36.8 C) 97.7 F (36.5 C)  TempSrc: Oral Oral Oral Oral  SpO2: 97% 98% 98% 98%  Weight:      Height:        Intake/Output Summary (Last 24 hours) at 10/08/2020 1616 Last data filed at 10/08/2020 1544 Gross per 24 hour  Intake 1303.33 ml  Output 1225 ml  Net 78.33 ml   Filed Weights   10/05/20 1641  Weight: 46 kg    Examination: General exam: Appears uncomfortable  HEENT: PERRLA, oral mucosa moist, no sclera icterus or thrush Respiratory system: Clear to auscultation. Respiratory effort normal. Cardiovascular system: S1 & S2 heard, RRR.   Gastrointestinal system: Abdomen soft, non-tender, nondistended. Normal bowel sounds. Central nervous system: Alert and oriented. No focal neurological deficits. Extremities: No cyanosis, clubbing or edema-left foot cool to touch Skin: Scab and  small wound noted on dorsum of right foot Psychiatry:  Mood & affect appropriate.     Data Reviewed: I have personally reviewed following labs and imaging studies  CBC: Recent Labs  Lab 10/05/20 1712 10/06/20 0537 10/07/20 0143 10/08/20 0213  WBC 5.1 4.9 4.1 5.1  NEUTROABS 2.7 2.0  --   --   HGB 15.1 12.8* 12.4* 13.4  HCT 46.1 39.8 37.7* 42.1  MCV 90.0 89.6 89.1 90.7  PLT 212 211 201 [id number]   Basic Metabolic Panel: Recent Labs  Lab 10/05/20 1712 10/06/20 0537 10/07/20 0143 10/08/20 0213  NA 137 135 133* 136  K 3.7 3.6 3.6 5.0  CL 101 103 100 97*  CO2 '23 23 24 29  '$ GLUCOSE 86 92 91 81  BUN '18 19 11 11  '$ CREATININE 1.17 1.01 1.05 1.07  CALCIUM 10.2 9.1 9.2 10.0  MG  --  2.1  --   --    GFR: Estimated Creatinine Clearance: 47.2 mL/min (by C-G formula based on SCr of 1.07 mg/dL). Liver Function Tests: Recent Labs  Lab 10/06/20 0537  AST 21  ALT 20  ALKPHOS 68  BILITOT 0.7  PROT 6.7  ALBUMIN 3.4*   No results for input(s): LIPASE, AMYLASE in the last 168 hours. No results for input(s): AMMONIA in the last 168 hours. Coagulation Profile: Recent Labs  Lab 10/05/20 2007  INR 1.1   Cardiac Enzymes: No results for input(s): CKTOTAL, CKMB, CKMBINDEX, TROPONINI in the last 168 hours. BNP (last 3 results) No results for input(s): PROBNP in the last 8760 hours. HbA1C: Recent Labs    10/06/20 0537  HGBA1C 5.5   CBG: Recent Labs  Lab 10/07/20 1347 10/07/20 1701 10/07/20 2123 10/08/20 0613 10/08/20 1155  GLUCAP 139* 107* 164* 92 109*   Lipid Profile: Recent Labs    10/06/20 0537  CHOL 174  HDL 44  LDLCALC 117*  TRIG 66  CHOLHDL 4.0   Thyroid Function Tests: No results for input(s): TSH, T4TOTAL, FREET4, T3FREE, THYROIDAB in the last 72 hours. Anemia Panel: No results for input(s): VITAMINB12, FOLATE, FERRITIN, TIBC, IRON, RETICCTPCT in the last 72 hours. Urine analysis:    Component Value Date/Time   COLORURINE STRAW (A) 05/10/2020 1722    APPEARANCEUR CLEAR 05/10/2020 1722   LABSPEC 1.008 05/10/2020 1722   PHURINE 8.0 05/10/2020 1722   GLUCOSEU NEGATIVE 05/10/2020 1722   HGBUR NEGATIVE 05/10/2020 1722   BILIRUBINUR NEGATIVE 05/10/2020 1722   KETONESUR 5 (A) 05/10/2020 1722   PROTEINUR NEGATIVE 05/10/2020 1722   NITRITE NEGATIVE 05/10/2020 1722   LEUKOCYTESUR NEGATIVE 05/10/2020 1722   Sepsis Labs: '@LABRCNTIP'$ (procalcitonin:4,lacticidven:4) ) Recent Results (from the past 240 hour(s))  Blood culture (routine x 2)     Status: None (Preliminary result)   Collection Time: 10/05/20  5:13 PM   Specimen: BLOOD  Result Value Ref Range Status   Specimen Description   Final    BLOOD LEFT ANTECUBITAL Performed at [facility], [place] [address], [place], [place] [address]  Special Requests   Final    BOTTLES DRAWN AEROBIC AND ANAEROBIC Blood Culture adequate volume Performed at [place] [address] [place], [place] [address]    Culture   Final    NO GROWTH 3 DAYS Performed at [facility], [place] [address], [place], [place] [address]    Report Status PENDING  Incomplete  Blood culture (routine x 2)     Status: None (Preliminary result)   Collection Time: 10/05/20  8:01 PM   Specimen: BLOOD  Result Value Ref Range Status   Specimen Description   Final    BLOOD LEFT ANTECUBITAL Performed at [place] [address] [place], [place] [address]    Special Requests   Final    BOTTLES DRAWN AEROBIC AND ANAEROBIC Blood Culture adequate volume Performed at [place] [address], [place], [place] [address]    Culture   Final    NO GROWTH 2 DAYS Performed at [facility] [address], [place], [place] [address]    Report Status PENDING  Incomplete  Resp Panel by RT-PCR (Flu A&B, Covid) Nasopharyngeal Swab     Status: None   Collection Time: 10/05/20  8:04 PM   Specimen: Nasopharyngeal Swab; Nasopharyngeal(NP) swabs  in vial transport medium  Result Value Ref Range Status   SARS Coronavirus 2 by RT PCR NEGATIVE NEGATIVE Final    Comment: (NOTE) SARS-CoV-2 target nucleic acids are NOT DETECTED.  The SARS-CoV-2 RNA is generally detectable in upper respiratory specimens during the acute phase of infection. The lowest concentration of SARS-CoV-2 viral copies this assay can detect is 138 copies/mL. A negative result does not preclude SARS-Cov-2 infection and should not be used as the sole basis for treatment or other patient management decisions. A negative result may occur with  improper specimen collection/handling, submission of specimen other than nasopharyngeal swab, presence of viral mutation(s) within the areas targeted by this assay, and inadequate number of viral copies(<138 copies/mL). A negative result must be combined with clinical observations, patient history, and epidemiological information. The expected result is Negative.  Fact Sheet for Patients:  EntrepreneurPulse.com.au  Fact Sheet for Healthcare Providers:  IncredibleEmployment.be  This test is no t yet approved or cleared by the [place] FDA and  has been authorized for detection and/or diagnosis of SARS-CoV-2 by FDA under an Emergency Use Authorization (EUA). This EUA will remain  in effect (meaning this test can be used) for the duration of the COVID-19 declaration under Section 564(b)(1) of the Act, 21 U.S.C.section 360bbb-3(b)(1), unless the authorization is terminated  or revoked sooner.       Influenza A by PCR NEGATIVE NEGATIVE Final   Influenza B by PCR NEGATIVE NEGATIVE Final    Comment: (NOTE) The Xpert Xpress SARS-CoV-2/FLU/RSV plus assay is intended as an aid in the diagnosis of influenza from Nasopharyngeal swab specimens and should not be used as a sole basis for treatment. Nasal washings and aspirates are unacceptable for Xpert Xpress  SARS-CoV-2/FLU/RSV testing.  Fact Sheet for Patients: EntrepreneurPulse.com.au  Fact Sheet for Healthcare Providers: IncredibleEmployment.be  This test is not yet approved or cleared by the [place] FDA and has been authorized for detection and/or diagnosis of SARS-CoV-2 by FDA under an Emergency Use Authorization (EUA). This EUA will remain in effect (meaning this test can be used) for the duration of the COVID-19 declaration under Section 564(b)(1) of the Act, 21 U.S.C. section 360bbb-3(b)(1), unless the authorization is  terminated or revoked.  Performed at [facility], [place] [address], [place], [place] [address]          Radiology Studies: NM Myocar Multi W/Spect Tamela Oddi Motion / EF  Result Date: 10/07/2020   Findings are consistent with no ischemia. The study is low risk.   No ST deviation was noted.   LV perfusion is abnormal. There is no evidence of ischemia. Defect 1: There is a medium defect with mild reduction in uptake present in the apical to basal inferior, inferolateral and apex location(s) that is fixed. There is normal wall motion in the defect area. Consistent with artifact caused by bowel tracer uptake.   Left ventricular function is normal. The left ventricular ejection fraction is normal (55-65%). End diastolic cavity size is normal. End systolic cavity size is normal. Significant artifact from extracardiac/bowel activity, but images improve with stress. Not consistent with ischemia. Low risk study.      Scheduled Meds:  aspirin EC  81 mg Oral Daily   atorvastatin  80 mg Oral Daily   gabapentin  1,800 mg Oral q morning   And   gabapentin  1,200 mg Oral QHS   heparin  5,000 Units Subcutaneous Q8H   lisinopril  20 mg Oral Daily   And   hydrochlorothiazide  25 mg Oral Daily   insulin aspart  0-15 Units Subcutaneous TID AC & HS   sodium chloride flush  3 mL Intravenous Q12H   Continuous Infusions:   sodium chloride 100 mL/hr at 10/06/20 1030   sodium chloride     [START ON 10/09/2020]  ceFAZolin (ANCEF) IV       LOS: 3 days      Debbe Odea, MD [facility] Pager: [email or web address] 10/08/2020, 4:16 PM

## 2020-10-08 NOTE — Plan of Care (Signed)
  Problem: Clinical Measurements: Goal: Will remain free from infection Outcome: Progressing Goal: Diagnostic test results will improve Outcome: Progressing   

## 2020-10-08 NOTE — Progress Notes (Signed)
Patient called asking for more pain medicine. Patient stated that his pain was "11" even after getting 1 mg dilaudid  '@2200'$  (0-10) 8.   Was able to doppler anterior popliteal which was sluggish. Unable to doppler or palpate dorsalis or posterior tibial. Foot was cool to touch. (No change).   Notified Dr. Trula Slade for additional pain meds. Verbal order given for additional dose of 1 mg dilaudid and 1-2 tablets percocet q4 PRN.   Patient was asleep when this RN rounded on him. Will continue to monitor

## 2020-10-09 ENCOUNTER — Inpatient Hospital Stay (HOSPITAL_COMMUNITY): Payer: Medicare HMO

## 2020-10-09 ENCOUNTER — Encounter (HOSPITAL_COMMUNITY)
Admission: EM | Disposition: A | Discharge status: Home Health | Payer: Self-pay | Source: Home / Self Care | Attending: Internal Medicine

## 2020-10-09 ENCOUNTER — Inpatient Hospital Stay (HOSPITAL_COMMUNITY): Payer: Medicare HMO | Admitting: Certified Registered Nurse Anesthetist

## 2020-10-09 ENCOUNTER — Encounter (HOSPITAL_COMMUNITY): Payer: Self-pay | Admitting: Internal Medicine

## 2020-10-09 DIAGNOSIS — I10 Essential (primary) hypertension: Secondary | ICD-10-CM | POA: Diagnosis not present

## 2020-10-09 DIAGNOSIS — T82856A Stenosis of peripheral vascular stent, initial encounter: Secondary | ICD-10-CM

## 2020-10-09 DIAGNOSIS — F17210 Nicotine dependence, cigarettes, uncomplicated: Secondary | ICD-10-CM | POA: Diagnosis not present

## 2020-10-09 DIAGNOSIS — E119 Type 2 diabetes mellitus without complications: Secondary | ICD-10-CM | POA: Diagnosis not present

## 2020-10-09 DIAGNOSIS — I70222 Atherosclerosis of native arteries of extremities with rest pain, left leg: Secondary | ICD-10-CM

## 2020-10-09 DIAGNOSIS — I70229 Atherosclerosis of native arteries of extremities with rest pain, unspecified extremity: Secondary | ICD-10-CM | POA: Diagnosis not present

## 2020-10-09 HISTORY — PX: ENDARTERECTOMY FEMORAL: SHX5804

## 2020-10-09 HISTORY — PX: AORTA - BILATERAL FEMORAL ARTERY BYPASS GRAFT: SHX1175

## 2020-10-09 LAB — BASIC METABOLIC PANEL
Anion gap: 8 (ref 5–15)
Anion gap: 9 (ref 5–15)
BUN: 12 mg/dL (ref 8–23)
BUN: 9 mg/dL (ref 8–23)
CO2: 26 mmol/L (ref 22–32)
CO2: 24 mmol/L (ref 22–32)
Calcium: 9.5 mg/dL (ref 8.9–10.3)
Calcium: 8.5 mg/dL — ABNORMAL LOW (ref 8.9–10.3)
Chloride: 100 mmol/L (ref 98–111)
Chloride: 98 mmol/L (ref 98–111)
Creatinine, Ser: 1.13 mg/dL (ref 0.61–1.24)
Creatinine, Ser: 0.91 mg/dL (ref 0.61–1.24)
GFR, Estimated: >60 mL/min (ref >60)
GFR, Estimated: >60 mL/min (ref >60)
Glucose, Bld: 110 mg/dL — ABNORMAL HIGH (ref 70–99)
Glucose, Bld: 189 mg/dL — ABNORMAL HIGH (ref 70–99)
Potassium: 3.7 mmol/L (ref 3.5–5.1)
Potassium: 3.5 mmol/L (ref 3.5–5.1)
Sodium: 134 mmol/L — ABNORMAL LOW (ref 135–145)
Sodium: 131 mmol/L — ABNORMAL LOW (ref 135–145)

## 2020-10-09 LAB — APTT: aPTT: 32 seconds (ref 24–36)

## 2020-10-09 LAB — POCT ACTIVATED CLOTTING TIME
Activated Clotting Time: 300 seconds
Activated Clotting Time: 231 seconds
Activated Clotting Time: 265 seconds

## 2020-10-09 LAB — CBC
HCT: 41.6 % (ref 39.0–52.0)
HCT: 34.4 % — ABNORMAL LOW (ref 39.0–52.0)
Hemoglobin: 13.6 g/dL (ref 13.0–17.0)
Hemoglobin: 11 g/dL — ABNORMAL LOW (ref 13.0–17.0)
MCH: 29 pg (ref 26.0–34.0)
MCH: 28.8 pg (ref 26.0–34.0)
MCHC: 32.7 g/dL (ref 30.0–36.0)
MCHC: 32 g/dL (ref 30.0–36.0)
MCV: 88.7 fL (ref 80.0–100.0)
MCV: 90.1 fL (ref 80.0–100.0)
Platelets: 255 10*3/uL (ref 150–400)
Platelets: 227 10*3/uL (ref 150–400)
RBC: 4.69 MIL/uL (ref 4.22–5.81)
RBC: 3.82 MIL/uL — ABNORMAL LOW (ref 4.22–5.81)
RDW: 15.9 % — ABNORMAL HIGH (ref 11.5–15.5)
RDW: 16 % — ABNORMAL HIGH (ref 11.5–15.5)
WBC: 5.5 10*3/uL (ref 4.0–10.5)
WBC: 16.5 10*3/uL — ABNORMAL HIGH (ref 4.0–10.5)
nRBC: 0 % (ref 0.0–0.2)
nRBC: 0 % (ref 0.0–0.2)

## 2020-10-09 LAB — BLOOD GAS, ARTERIAL
Acid-base deficit: 2.5 mmol/L — ABNORMAL HIGH (ref 0.0–2.0)
Bicarbonate: 22.6 mmol/L (ref 20.0–28.0)
FIO2: 28
O2 Saturation: 98.8 %
Patient temperature: 36.1
pCO2 arterial: 42.3 mmHg (ref 32.0–48.0)
pH, Arterial: 7.342 — ABNORMAL LOW (ref 7.350–7.450)
pO2, Arterial: 156 mmHg — ABNORMAL HIGH (ref 83.0–108.0)

## 2020-10-09 LAB — POCT I-STAT 7, (LYTES, BLD GAS, ICA,H+H)
Acid-base deficit: 1 mmol/L (ref 0.0–2.0)
Bicarbonate: 24.9 mmol/L (ref 20.0–28.0)
Calcium, Ion: 1.19 mmol/L (ref 1.15–1.40)
HCT: 31 % — ABNORMAL LOW (ref 39.0–52.0)
Hemoglobin: 10.5 g/dL — ABNORMAL LOW (ref 13.0–17.0)
O2 Saturation: 100 %
Potassium: 3.1 mmol/L — ABNORMAL LOW (ref 3.5–5.1)
Sodium: 136 mmol/L (ref 135–145)
TCO2: 26 mmol/L (ref 22–32)
pCO2 arterial: 47.8 mmHg (ref 32.0–48.0)
pH, Arterial: 7.324 — ABNORMAL LOW (ref 7.350–7.450)
pO2, Arterial: 300 mmHg — ABNORMAL HIGH (ref 83.0–108.0)

## 2020-10-09 LAB — PROTIME-INR
INR: 1.1 (ref 0.8–1.2)
Prothrombin Time: 14.5 seconds (ref 11.4–15.2)

## 2020-10-09 LAB — GLUCOSE, CAPILLARY
Glucose-Capillary: 111 mg/dL — ABNORMAL HIGH (ref 70–99)
Glucose-Capillary: 133 mg/dL — ABNORMAL HIGH (ref 70–99)
Glucose-Capillary: 137 mg/dL — ABNORMAL HIGH (ref 70–99)
Glucose-Capillary: 180 mg/dL — ABNORMAL HIGH (ref 70–99)
Glucose-Capillary: 203 mg/dL — ABNORMAL HIGH (ref 70–99)

## 2020-10-09 LAB — MAGNESIUM: Magnesium: 1.5 mg/dL — ABNORMAL LOW (ref 1.7–2.4)

## 2020-10-09 LAB — PREPARE RBC (CROSSMATCH)

## 2020-10-09 SURGERY — CREATION, BYPASS, ARTERIAL, AORTA TO FEMORAL, BILATERAL, USING GRAFT
Anesthesia: General | Site: Groin

## 2020-10-09 MED ORDER — DEXAMETHASONE SODIUM PHOSPHATE 10 MG/ML IJ SOLN
INTRAMUSCULAR | Status: AC
  Filled 2020-10-09: qty 1

## 2020-10-09 MED ORDER — FENTANYL CITRATE (PF) 100 MCG/2ML IJ SOLN
INTRAMUSCULAR | Status: AC
  Filled 2020-10-09: qty 2

## 2020-10-09 MED ORDER — KETAMINE HCL 50 MG/5ML IJ SOSY
PREFILLED_SYRINGE | INTRAMUSCULAR | Status: AC
  Filled 2020-10-09: qty 5

## 2020-10-09 MED ORDER — ACETAMINOPHEN 10 MG/ML IV SOLN
INTRAVENOUS | Status: AC
  Filled 2020-10-09: qty 100

## 2020-10-09 MED ORDER — ACETAMINOPHEN 325 MG PO TABS
325.0000 mg | ORAL_TABLET | ORAL | Status: DC | PRN
  Administered 2020-10-09: 650 mg via ORAL
  Filled 2020-10-09: qty 2

## 2020-10-09 MED ORDER — LACTATED RINGERS IV SOLN: INTRAVENOUS | Status: DC | PRN

## 2020-10-09 MED ORDER — FLEET ENEMA 7-19 GM/118ML RE ENEM
1.0000 | ENEMA | Freq: Once | RECTAL | Status: DC | PRN
  Filled 2020-10-09: qty 1

## 2020-10-09 MED ORDER — FENTANYL CITRATE (PF) 250 MCG/5ML IJ SOLN
INTRAMUSCULAR | Status: AC
  Filled 2020-10-09: qty 5

## 2020-10-09 MED ORDER — CEFAZOLIN SODIUM 1 G IJ SOLR
INTRAMUSCULAR | Status: AC
  Filled 2020-10-09: qty 10

## 2020-10-09 MED ORDER — LABETALOL HCL 5 MG/ML IV SOLN
10.0000 mg | INTRAVENOUS | Status: DC | PRN
  Administered 2020-10-10: 10 mg via INTRAVENOUS
  Filled 2020-10-09: qty 4

## 2020-10-09 MED ORDER — ROCURONIUM BROMIDE 10 MG/ML (PF) SYRINGE
PREFILLED_SYRINGE | INTRAVENOUS | Status: AC
  Filled 2020-10-09: qty 10

## 2020-10-09 MED ORDER — MIDAZOLAM HCL 2 MG/2ML IJ SOLN
INTRAMUSCULAR | Status: DC | PRN
  Administered 2020-10-09 (×2): 1 mg via INTRAVENOUS

## 2020-10-09 MED ORDER — FENTANYL CITRATE (PF) 100 MCG/2ML IJ SOLN
25.0000 ug | INTRAMUSCULAR | Status: DC | PRN
  Administered 2020-10-09 (×2): 50 ug via INTRAVENOUS

## 2020-10-09 MED ORDER — KCL IN DEXTROSE-NACL 20-5-0.45 MEQ/L-%-% IV SOLN
INTRAVENOUS | Status: DC
  Filled 2020-10-09: qty 1000

## 2020-10-09 MED ORDER — 0.9 % SODIUM CHLORIDE (POUR BTL) OPTIME
TOPICAL | Status: DC | PRN
  Administered 2020-10-09: 1000 mL
  Administered 2020-10-09: 2000 mL

## 2020-10-09 MED ORDER — LABETALOL HCL 5 MG/ML IV SOLN
INTRAVENOUS | Status: AC
  Filled 2020-10-09: qty 4

## 2020-10-09 MED ORDER — PANTOPRAZOLE SODIUM 40 MG PO TBEC
40.0000 mg | DELAYED_RELEASE_TABLET | Freq: Every day | ORAL | Status: DC
  Administered 2020-10-09 – 2020-10-11 (×3): 40 mg via ORAL
  Filled 2020-10-09 (×3): qty 1

## 2020-10-09 MED ORDER — POTASSIUM CHLORIDE CRYS ER 20 MEQ PO TBCR
20.0000 meq | EXTENDED_RELEASE_TABLET | Freq: Every day | ORAL | Status: AC | PRN
  Administered 2020-10-09: 20 meq via ORAL
  Filled 2020-10-09: qty 2

## 2020-10-09 MED ORDER — HYDRALAZINE HCL 20 MG/ML IJ SOLN
5.0000 mg | INTRAMUSCULAR | Status: DC | PRN
  Administered 2020-10-09: 5 mg via INTRAVENOUS
  Filled 2020-10-09: qty 1

## 2020-10-09 MED ORDER — SODIUM CHLORIDE 0.9 % IV SOLN: 500.0000 mL | Freq: Once | INTRAVENOUS | Status: DC | PRN

## 2020-10-09 MED ORDER — OXYCODONE HCL 5 MG/5ML PO SOLN
5.0000 mg | Freq: Once | ORAL | Status: DC | PRN
Start: 2020-10-09 — End: 2020-10-09

## 2020-10-09 MED ORDER — PHENYLEPHRINE 40 MCG/ML (10ML) SYRINGE FOR IV PUSH (FOR BLOOD PRESSURE SUPPORT)
PREFILLED_SYRINGE | INTRAVENOUS | Status: DC | PRN
  Administered 2020-10-09: 120 ug via INTRAVENOUS
  Administered 2020-10-09: 40 ug via INTRAVENOUS

## 2020-10-09 MED ORDER — DEXMEDETOMIDINE (PRECEDEX) IN NS 20 MCG/5ML (4 MCG/ML) IV SYRINGE
PREFILLED_SYRINGE | INTRAVENOUS | Status: DC | PRN
  Administered 2020-10-09 (×3): 4 ug via INTRAVENOUS
  Administered 2020-10-09: 8 ug via INTRAVENOUS

## 2020-10-09 MED ORDER — ALUM & MAG HYDROXIDE-SIMETH 200-200-20 MG/5ML PO SUSP: 15.0000 mL | ORAL | Status: DC | PRN

## 2020-10-09 MED ORDER — PHENYLEPHRINE 40 MCG/ML (10ML) SYRINGE FOR IV PUSH (FOR BLOOD PRESSURE SUPPORT)
PREFILLED_SYRINGE | INTRAVENOUS | Status: AC
  Filled 2020-10-09: qty 10

## 2020-10-09 MED ORDER — METOPROLOL TARTRATE 5 MG/5ML IV SOLN: 2.0000 mg | INTRAVENOUS | Status: DC | PRN

## 2020-10-09 MED ORDER — DOCUSATE SODIUM 100 MG PO CAPS: 100.0000 mg | ORAL_CAPSULE | Freq: Every day | ORAL | Status: DC

## 2020-10-09 MED ORDER — PHENOL 1.4 % MT LIQD: 1.0000 | OROMUCOSAL | Status: DC | PRN

## 2020-10-09 MED ORDER — CEFAZOLIN SODIUM-DEXTROSE 2-4 GM/100ML-% IV SOLN
2.0000 g | Freq: Three times a day (TID) | INTRAVENOUS | Status: AC
  Administered 2020-10-09 (×2): 2 g via INTRAVENOUS
  Filled 2020-10-09 (×3): qty 100

## 2020-10-09 MED ORDER — PROPOFOL 10 MG/ML IV BOLUS
INTRAVENOUS | Status: DC | PRN
  Administered 2020-10-09 (×2): 40 mg via INTRAVENOUS
  Administered 2020-10-09: 90 mg via INTRAVENOUS

## 2020-10-09 MED ORDER — ACETAMINOPHEN 325 MG RE SUPP: 325.0000 mg | RECTAL | Status: DC | PRN

## 2020-10-09 MED ORDER — ALBUMIN HUMAN 5 % IV SOLN: INTRAVENOUS | Status: DC | PRN

## 2020-10-09 MED ORDER — SUGAMMADEX SODIUM 200 MG/2ML IV SOLN
INTRAVENOUS | Status: DC | PRN
  Administered 2020-10-09: 100 mg via INTRAVENOUS

## 2020-10-09 MED ORDER — DOCUSATE SODIUM 100 MG PO CAPS
200.0000 mg | ORAL_CAPSULE | Freq: Two times a day (BID) | ORAL | Status: DC
  Administered 2020-10-09 – 2020-10-11 (×4): 200 mg via ORAL
  Filled 2020-10-09 (×6): qty 2

## 2020-10-09 MED ORDER — DEXAMETHASONE SODIUM PHOSPHATE 10 MG/ML IJ SOLN
INTRAMUSCULAR | Status: DC | PRN
  Administered 2020-10-09: 5 mg via INTRAVENOUS

## 2020-10-09 MED ORDER — BISACODYL 10 MG RE SUPP: 10.0000 mg | Freq: Every day | RECTAL | Status: DC | PRN

## 2020-10-09 MED ORDER — ONDANSETRON HCL 4 MG/2ML IJ SOLN
INTRAMUSCULAR | Status: AC
  Filled 2020-10-09: qty 2

## 2020-10-09 MED ORDER — CHLORHEXIDINE GLUCONATE CLOTH 2 % EX PADS
6.0000 | MEDICATED_PAD | Freq: Every day | CUTANEOUS | Status: DC
  Administered 2020-10-09 – 2020-10-12 (×3): 6 via TOPICAL

## 2020-10-09 MED ORDER — PROPOFOL 10 MG/ML IV BOLUS
INTRAVENOUS | Status: AC
  Filled 2020-10-09: qty 20

## 2020-10-09 MED ORDER — ACETAMINOPHEN 10 MG/ML IV SOLN
INTRAVENOUS | Status: DC | PRN
  Administered 2020-10-09: 1000 mg via INTRAVENOUS

## 2020-10-09 MED ORDER — MAGNESIUM SULFATE 2 GM/50ML IV SOLN
2.0000 g | Freq: Every day | INTRAVENOUS | Status: AC | PRN
  Administered 2020-10-09: 2 g via INTRAVENOUS
  Filled 2020-10-09: qty 50

## 2020-10-09 MED ORDER — PROMETHAZINE HCL 25 MG/ML IJ SOLN: 6.2500 mg | INTRAMUSCULAR | Status: DC | PRN

## 2020-10-09 MED ORDER — OXYCODONE HCL 5 MG PO TABS: 5.0000 mg | ORAL_TABLET | Freq: Once | ORAL | Status: DC | PRN

## 2020-10-09 MED ORDER — HEMOSTATIC AGENTS (NO CHARGE) OPTIME
TOPICAL | Status: DC | PRN
  Administered 2020-10-09 (×2): 1 via TOPICAL

## 2020-10-09 MED ORDER — PHENYLEPHRINE HCL-NACL 20-0.9 MG/250ML-% IV SOLN
INTRAVENOUS | Status: DC | PRN
  Administered 2020-10-09: 25 ug/min via INTRAVENOUS

## 2020-10-09 MED ORDER — SODIUM CHLORIDE 0.9% IV SOLUTION: Freq: Once | INTRAVENOUS | Status: DC

## 2020-10-09 MED ORDER — BISACODYL 10 MG RE SUPP
10.0000 mg | Freq: Every day | RECTAL | Status: DC
  Filled 2020-10-09: qty 1

## 2020-10-09 MED ORDER — HEPARIN SODIUM (PORCINE) 5000 UNIT/ML IJ SOLN
5000.0000 [IU] | Freq: Three times a day (TID) | INTRAMUSCULAR | Status: DC
  Administered 2020-10-09 – 2020-10-15 (×16): 5000 [IU] via SUBCUTANEOUS
  Filled 2020-10-09 (×17): qty 1

## 2020-10-09 MED ORDER — HEPARIN SODIUM (PORCINE) 1000 UNIT/ML IJ SOLN
INTRAMUSCULAR | Status: DC | PRN
  Administered 2020-10-09: 5000 [IU] via INTRAVENOUS

## 2020-10-09 MED ORDER — MIDAZOLAM HCL 2 MG/2ML IJ SOLN
INTRAMUSCULAR | Status: AC
  Filled 2020-10-09: qty 2

## 2020-10-09 MED ORDER — FENTANYL CITRATE (PF) 250 MCG/5ML IJ SOLN
INTRAMUSCULAR | Status: DC | PRN
  Administered 2020-10-09: 25 ug via INTRAVENOUS
  Administered 2020-10-09: 50 ug via INTRAVENOUS
  Administered 2020-10-09: 75 ug via INTRAVENOUS
  Administered 2020-10-09 (×2): 50 ug via INTRAVENOUS

## 2020-10-09 MED ORDER — LACTATED RINGERS IV SOLN: INTRAVENOUS | Status: DC

## 2020-10-09 MED ORDER — HEPARIN 6000 UNIT IRRIGATION SOLUTION
Status: DC | PRN
  Administered 2020-10-09: 1

## 2020-10-09 MED ORDER — KETAMINE HCL 10 MG/ML IJ SOLN
INTRAMUSCULAR | Status: DC | PRN
  Administered 2020-10-09: 10 mg via INTRAVENOUS
  Administered 2020-10-09: 15 mg via INTRAVENOUS

## 2020-10-09 MED ORDER — ACETAMINOPHEN 325 MG PO TABS
650.0000 mg | ORAL_TABLET | Freq: Four times a day (QID) | ORAL | Status: DC
  Administered 2020-10-10 – 2020-10-14 (×17): 650 mg via ORAL
  Filled 2020-10-09 (×20): qty 2

## 2020-10-09 MED ORDER — HYDROMORPHONE HCL 1 MG/ML IJ SOLN
0.5000 mg | INTRAMUSCULAR | Status: DC | PRN
  Administered 2020-10-09 – 2020-10-10 (×7): 1 mg via INTRAVENOUS
  Filled 2020-10-09 (×7): qty 1

## 2020-10-09 MED ORDER — ONDANSETRON HCL 4 MG/2ML IJ SOLN
INTRAMUSCULAR | Status: DC | PRN
  Administered 2020-10-09: 4 mg via INTRAVENOUS

## 2020-10-09 MED ORDER — PROTAMINE SULFATE 10 MG/ML IV SOLN
INTRAVENOUS | Status: DC | PRN
  Administered 2020-10-09: 50 mg via INTRAVENOUS

## 2020-10-09 MED ORDER — LIDOCAINE 2% (20 MG/ML) 5 ML SYRINGE
INTRAMUSCULAR | Status: AC
  Filled 2020-10-09: qty 5

## 2020-10-09 MED ORDER — LIDOCAINE 2% (20 MG/ML) 5 ML SYRINGE
INTRAMUSCULAR | Status: DC | PRN
  Administered 2020-10-09: 60 mg via INTRAVENOUS

## 2020-10-09 MED ORDER — OXYCODONE HCL 5 MG PO TABS
5.0000 mg | ORAL_TABLET | ORAL | Status: DC | PRN
  Administered 2020-10-10 (×2): 10 mg via ORAL
  Administered 2020-10-10: 5 mg via ORAL
  Administered 2020-10-10: 10 mg via ORAL
  Administered 2020-10-11: 5 mg via ORAL
  Filled 2020-10-09: qty 2
  Filled 2020-10-09: qty 1
  Filled 2020-10-09 (×3): qty 2

## 2020-10-09 MED ORDER — ONDANSETRON HCL 4 MG/2ML IJ SOLN: 4.0000 mg | Freq: Four times a day (QID) | INTRAMUSCULAR | Status: DC | PRN

## 2020-10-09 MED ORDER — ROCURONIUM BROMIDE 10 MG/ML (PF) SYRINGE
PREFILLED_SYRINGE | INTRAVENOUS | Status: DC | PRN
  Administered 2020-10-09: 10 mg via INTRAVENOUS
  Administered 2020-10-09: 60 mg via INTRAVENOUS

## 2020-10-09 SURGICAL SUPPLY — 74 items
ADH SKN CLS APL DERMABOND .7 (GAUZE/BANDAGES/DRESSINGS) ×15
APL PRP STRL LF DISP 70% ISPRP (MISCELLANEOUS) ×6
BAG COUNTER SPONGE SURGICOUNT (BAG) ×4 IMPLANT
BAG ISL DRAPE 18X18 STRL (DRAPES) ×6
BAG ISOLATION DRAPE 18X18 (DRAPES) IMPLANT
BAG SPNG CNTER NS LX DISP (BAG) ×3
CANISTER SUCT 3000ML PPV (MISCELLANEOUS) ×4 IMPLANT
CATH ROBINSON RED A/P 18FR (CATHETERS) ×8 IMPLANT
CHLORAPREP W/TINT 26 (MISCELLANEOUS) ×8 IMPLANT
CLIP VESOCCLUDE MED 24/CT (CLIP) ×4 IMPLANT
CLIP VESOCCLUDE SM WIDE 24/CT (CLIP) ×4 IMPLANT
COVER PROBE W GEL 5X96 (DRAPES) ×4 IMPLANT
DERMABOND ADVANCED (GAUZE/BANDAGES/DRESSINGS) ×5
DERMABOND ADVANCED .7 DNX12 (GAUZE/BANDAGES/DRESSINGS) ×6 IMPLANT
DRAPE ISOLATION BAG 18X18 (DRAPES) ×8
ELECT BLADE 4.0 EZ CLEAN MEGAD (MISCELLANEOUS) ×12
ELECT BLADE 6.5 EXT (BLADE) IMPLANT
ELECT REM PT RETURN 9FT ADLT (ELECTROSURGICAL) ×8
ELECTRODE BLDE 4.0 EZ CLN MEGD (MISCELLANEOUS) ×3 IMPLANT
ELECTRODE REM PT RTRN 9FT ADLT (ELECTROSURGICAL) ×3 IMPLANT
FELT TEFLON 1X6 (MISCELLANEOUS) ×1 IMPLANT
GAUZE 4X4 16PLY ~~LOC~~+RFID DBL (SPONGE) ×1 IMPLANT
GLOVE SURG MICRO LTX SZ6.5 (GLOVE) ×1 IMPLANT
GLOVE SURG POLYISO LF SZ8 (GLOVE) ×4 IMPLANT
GLOVE SURG UNDER POLY LF SZ7 (GLOVE) ×1 IMPLANT
GOWN STRL REUS W/ TWL LRG LVL3 (GOWN DISPOSABLE) ×6 IMPLANT
GOWN STRL REUS W/ TWL XL LVL3 (GOWN DISPOSABLE) ×3 IMPLANT
GOWN STRL REUS W/TWL LRG LVL3 (GOWN DISPOSABLE) ×8
GOWN STRL REUS W/TWL XL LVL3 (GOWN DISPOSABLE) ×16
GRAFT HEMASHIELD 14X7MM (Vascular Products) ×1 IMPLANT
HEMOSTAT SNOW SURGICEL 2X4 (HEMOSTASIS) ×2 IMPLANT
INSERT FOGARTY 61MM (MISCELLANEOUS) ×1 IMPLANT
INSERT FOGARTY SM (MISCELLANEOUS) ×8 IMPLANT
KIT BASIN OR (CUSTOM PROCEDURE TRAY) ×4 IMPLANT
KIT TURNOVER KIT B (KITS) ×4 IMPLANT
NS IRRIG 1000ML POUR BTL (IV SOLUTION) ×8 IMPLANT
PACK AORTA (CUSTOM PROCEDURE TRAY) ×4 IMPLANT
PAD ARMBOARD 7.5X6 YLW CONV (MISCELLANEOUS) ×8 IMPLANT
PENCIL BUTTON HOLSTER BLD 10FT (ELECTRODE) ×1 IMPLANT
SPONGE T-LAP 18X18 ~~LOC~~+RFID (SPONGE) ×4 IMPLANT
SUT MNCRL AB 4-0 PS2 18 (SUTURE) ×10 IMPLANT
SUT PDS AB 1 TP1 54 (SUTURE) ×8 IMPLANT
SUT PROLENE 3 0 SH 1 (SUTURE) ×1 IMPLANT
SUT PROLENE 3 0 SH1 36 (SUTURE) ×4 IMPLANT
SUT PROLENE 4 0 RB 1 (SUTURE)
SUT PROLENE 4-0 RB1 .5 CRCL 36 (SUTURE) ×6 IMPLANT
SUT PROLENE 5 0 C 1 24 (SUTURE) ×11 IMPLANT
SUT PROLENE 5 0 C 1 36 (SUTURE) IMPLANT
SUT PROLENE 6 0 BV (SUTURE) ×1 IMPLANT
SUT SILK 2 0 (SUTURE) ×4
SUT SILK 2 0 PERMA HAND 18 BK (SUTURE) ×1 IMPLANT
SUT SILK 2 0 TIES 17X18 (SUTURE)
SUT SILK 2 0SH CR/8 30 (SUTURE) ×4 IMPLANT
SUT SILK 2-0 18XBRD TIE 12 (SUTURE) ×3 IMPLANT
SUT SILK 2-0 18XBRD TIE BLK (SUTURE) ×3 IMPLANT
SUT SILK 3 0 (SUTURE) ×8
SUT SILK 3 0 TIES 17X18 (SUTURE) ×4
SUT SILK 3-0 18XBRD TIE 12 (SUTURE) ×3 IMPLANT
SUT SILK 3-0 18XBRD TIE BLK (SUTURE) ×3 IMPLANT
SUT VIC AB 0 CT1 18XCR BRD 8 (SUTURE) ×6 IMPLANT
SUT VIC AB 0 CT1 8-18 (SUTURE) ×4
SUT VIC AB 2-0 CT1 27 (SUTURE) ×8
SUT VIC AB 2-0 CT1 TAPERPNT 27 (SUTURE) ×6 IMPLANT
SUT VIC AB 3-0 SH 27 (SUTURE) ×16
SUT VIC AB 3-0 SH 27X BRD (SUTURE) IMPLANT
SYR BULB IRRIG 60ML STRL (SYRINGE) ×4 IMPLANT
TAPE UMBILICAL 1/8X30 (MISCELLANEOUS) ×4 IMPLANT
TOWEL GREEN STERILE (TOWEL DISPOSABLE) ×4 IMPLANT
TOWEL ~~LOC~~+RFID 17X26 BLUE (SPONGE) ×4 IMPLANT
TRAY FOL W/BAG SLVR 16FR STRL (SET/KITS/TRAYS/PACK) IMPLANT
TRAY FOLEY MTR SLVR 16FR STAT (SET/KITS/TRAYS/PACK) ×3 IMPLANT
TRAY FOLEY SLVR 14FR TEMP STAT (SET/KITS/TRAYS/PACK) ×1 IMPLANT
TRAY FOLEY W/BAG SLVR 16FR LF (SET/KITS/TRAYS/PACK) ×4
WATER STERILE IRR 1000ML POUR (IV SOLUTION) ×7 IMPLANT

## 2020-10-09 NOTE — Op Note (Signed)
    Patient name: [name]. MRN: [id number] DOB: [birth year] Sex: male  10/09/2020 Pre-operative Diagnosis: Ischemic bilateral lower extremity rest pain PROCEDURE:   1) aorto-bi-femoral bypass (14x70m Dacron) 2) right femoral endarterectomy 3) left femoral endarterectomy CO-SURGEONS:  Surgeon(s) and Role:             TYevonne Aline HStanford Breed MD BEda PaschalCDonzetta Matters MD ASSISTANT: MGerri Lins PA-C    Procedure:  The patient was identified in the holding area and taken to the operating room where general anesthesia was induced.  Full details of the operation are dictated by Dr. HStanford Breed  I assisted with exposure of the aorta and proximal anastomosis.  I performed left common femoral endarterectomy and left lower common femoral anastomosis to expedite the case in a patient with multiple medical comorbidities.  At completion of the left common femoral anastomosis there is very strong Doppler flow in both the superficial femoral and profunda femoris arteries   Haniya Fern C. CDonzetta Matters MD [facility] GHanoverOffice: [phone]: [phone]

## 2020-10-09 NOTE — Progress Notes (Signed)
PROGRESS NOTE    [name] [employer].   LF:[id number]  DOB: [birth year]  DOA: 10/05/2020 PCP: Ruben Mile, NP   Brief Narrative:  [name]. 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 The patient presented to the ED with severe left leg pain which he said was progressive over the past 2 weeks.  CTA of aorta with iliofemoral runoff revealed a complete occlusion of the stent in the left superficial femoral artery and nonopacification of the left posterior tibial artery.  Vascular surgery consult was requested.   Subjective: Evaluated after surgery. Left foot pain is improving.    Assessment & Plan:   Principal Problem:   Critical lower limb ischemia, peripheral artery disease Mild lactic acidosis - Lactic acid level noted to be 2.2 on 10/05/2020 -He has been taking Lipitor aspirin and Plavix at home - Continue heparin infusion, aspirin and Lipitor-Plavix is on hold - Continue pain control -Cardiology clearance requested by vascular surgery-the patient underwent a nuclear stress test which is a low risk study-2D echo shows a normal LV function-- - Risk factor modifications discussed with patient - s/p aroto-bi-femoral bypass, left and right femoral endarterectomy today - is in ICU for close monitoring after procedure  Active Problems:  Hypomagnesemia - being replaced IV - recheck tomorrow     Type 2 diabetes mellitus with peripheral neuropathy - he is no on medications at home -Continue sliding scale NovoLog and gabapentin - Hemoglobin A1C    Component Value Date/Time   HGBA1C 5.5 10/06/2020 0537      Nicotine dependence, cigarettes, uncomplicated -Has been counseled on quitting smoking especially in light  of his current extensive vascular issues  Hypertension - BP has been low to normal -holding lisinopril/HCTZ in the perioperative period -continue as needed hydralazine   Time spent in minutes: 35 DVT prophylaxis: heparin Code Status: Full code Family Communication:  Level of Care: Level of care: ICU Disposition Plan:  Status is: Inpatient  Remains inpatient appropriate because:Inpatient level of care appropriate due to severity of illness  Dispo: The patient is from: Home              Anticipated d/c is to: Home              Patient currently is not medically stable to d/c.   Difficult to place patient No  Consultants:  Vascular surgery Cardiology  Procedures:  Nuclear stress test Aortogram with bilateral lower extremity runoff Antimicrobials:  Anti-infectives (From admission, onward)    Start     Dose/Rate Route Frequency Ordered Stop   10/09/20 1345  ceFAZolin (ANCEF) IVPB 2g/100 mL premix        2 g 200 mL/hr over 30 Minutes Intravenous Every 8 hours 10/09/20 1333 10/10/20 0544   10/09/20 0600  ceFAZolin (ANCEF) IVPB 1 g/50 mL premix        1 g 100 mL/hr over 30 Minutes Intravenous To Short Stay 10/08/20 0825 10/09/20 0815        Objective: Vitals:   10/09/20 1326 10/09/20 1354 10/09/20 1415 10/09/20 1430  BP: (!) 161/80 (!) 192/91 (!) 123/59 (!) 129/58  Pulse: 73  74 80  Resp: '16  11 11  '$ Temp: 97.8 F (36.6 C)     TempSrc:      SpO2:  100%  100% 100%  Weight:      Height:        Intake/Output Summary (Last 24 hours) at 10/09/2020 1436 Last data filed at 10/09/2020 1326 Gross per 24 hour  Intake 2250 ml  Output 2045 ml  Net 205 ml    Filed Weights   10/05/20 1641  Weight: 46 kg    Examination: General exam: Appears comfortable  HEENT: PERRLA, oral mucosa moist, no sclera icterus or thrush Respiratory system: Clear to auscultation. Respiratory effort normal. Cardiovascular system: S1 & S2 heard, regular rate and rhythm Gastrointestinal  system: Abdomen soft, nondistended. Normal bowel sounds  - surgical incisions noted- no bleeding Central nervous system: Alert and oriented. No focal neurological deficits. Extremities: No cyanosis, clubbing or edema Skin: No rashes or ulcers Psychiatry:  Mood & affect appropriate.      Data Reviewed: I have personally reviewed following labs and imaging studies  CBC: Recent Labs  Lab 10/05/20 1712 10/06/20 0537 10/07/20 0143 10/08/20 0213 10/09/20 0226 10/09/20 1216  WBC 5.1 4.9 4.1 5.1 5.5 16.5*  NEUTROABS 2.7 2.0  --   --   --   --   HGB 15.1 12.8* 12.4* 13.4 13.6 11.0*  HCT 46.1 39.8 37.7* 42.1 41.6 34.4*  MCV 90.0 89.6 89.1 90.7 88.7 90.1  PLT 212 211 201 234 255 [id number]    Basic Metabolic Panel: Recent Labs  Lab 10/06/20 0537 10/07/20 0143 10/08/20 0213 10/09/20 0226 10/09/20 1216  NA 135 133* 136 134* 131*  K 3.6 3.6 5.0 3.7 3.5  CL 103 100 97* 100 98  CO2 '23 24 29 26 24  '$ GLUCOSE 92 91 81 110* 189*  BUN '19 11 11 12 9  '$ CREATININE 1.01 1.05 1.07 1.13 0.91  CALCIUM 9.1 9.2 10.0 9.5 8.5*  MG 2.1  --   --   --  1.5*    GFR: Estimated Creatinine Clearance: 55.5 mL/min (by C-G formula based on SCr of 0.91 mg/dL). Liver Function Tests: Recent Labs  Lab 10/06/20 0537  AST 21  ALT 20  ALKPHOS 68  BILITOT 0.7  PROT 6.7  ALBUMIN 3.4*    No results for input(s): LIPASE, AMYLASE in the last 168 hours. No results for input(s): AMMONIA in the last 168 hours. Coagulation Profile: Recent Labs  Lab 10/05/20 2007 10/09/20 1216  INR 1.1 1.1    Cardiac Enzymes: No results for input(s): CKTOTAL, CKMB, CKMBINDEX, TROPONINI in the last 168 hours. BNP (last 3 results) No results for input(s): PROBNP in the last 8760 hours. HbA1C: No results for input(s): HGBA1C in the last 72 hours.  CBG: Recent Labs  Lab 10/08/20 1155 10/08/20 1631 10/08/20 2133 10/09/20 0552 10/09/20 1140  GLUCAP 109* 157* 151* 111* 180*    Lipid Profile: No results for input(s):  CHOL, HDL, LDLCALC, TRIG, CHOLHDL, LDLDIRECT in the last 72 hours.  Thyroid Function Tests: No results for input(s): TSH, T4TOTAL, FREET4, T3FREE, THYROIDAB in the last 72 hours. Anemia Panel: No results for input(s): VITAMINB12, FOLATE, FERRITIN, TIBC, IRON, RETICCTPCT in the last 72 hours. Urine analysis:    Component Value Date/Time   COLORURINE STRAW (A) 05/10/2020 1722   APPEARANCEUR CLEAR 05/10/2020 1722   LABSPEC 1.008 05/10/2020 1722   PHURINE 8.0 05/10/2020 1722   GLUCOSEU NEGATIVE 05/10/2020 1722   HGBUR NEGATIVE 05/10/2020 1722   BILIRUBINUR NEGATIVE 05/10/2020 1722   KETONESUR 5 (A) 05/10/2020 1722   PROTEINUR NEGATIVE 05/10/2020 1722   NITRITE NEGATIVE 05/10/2020 1722   LEUKOCYTESUR NEGATIVE  05/10/2020 1722   Sepsis Labs: '@LABRCNTIP'$ (procalcitonin:4,lacticidven:4) ) Recent Results (from the past 240 hour(s))  Blood culture (routine x 2)     Status: None (Preliminary result)   Collection Time: 10/05/20  5:13 PM   Specimen: BLOOD  Result Value Ref Range Status   Specimen Description   Final    BLOOD LEFT ANTECUBITAL Performed at [facility] [address] [place], [place] [address]    Special Requests   Final    BOTTLES DRAWN AEROBIC AND ANAEROBIC Blood Culture adequate volume Performed at [facility] [address] [place], [place] [address]    Culture   Final    NO GROWTH 4 DAYS Performed at [facility], [place] [address], [place], [place] [address]    Report Status PENDING  Incomplete  Blood culture (routine x 2)     Status: None (Preliminary result)   Collection Time: 10/05/20  8:01 PM   Specimen: BLOOD  Result Value Ref Range Status   Specimen Description   Final    BLOOD LEFT ANTECUBITAL Performed at [facility] [address] [place], [place] [address]    Special Requests   Final    BOTTLES DRAWN AEROBIC AND ANAEROBIC Blood Culture adequate volume Performed at [facility] [address], [place], [place] [address]    Culture   Final    NO GROWTH 3 DAYS Performed at [facility], [place] [address], [place],  [address]    Report Status PENDING  Incomplete  Resp Panel by RT-PCR (Flu A&B, Covid) Nasopharyngeal Swab     Status: None   Collection Time: 10/05/20  8:04 PM   Specimen: Nasopharyngeal Swab; Nasopharyngeal(NP) swabs in vial transport medium  Result Value Ref Range Status   SARS Coronavirus 2 by RT PCR NEGATIVE NEGATIVE Final    Comment: (NOTE) SARS-CoV-2 target nucleic acids are NOT DETECTED.  The SARS-CoV-2 RNA is generally detectable in upper respiratory specimens during the acute phase of infection. The lowest concentration of SARS-CoV-2 viral copies this assay can detect is 138 copies/mL. A negative result does not preclude SARS-Cov-2 infection and should not be used as the sole basis for treatment or other patient management decisions. A negative result may occur with  improper specimen collection/handling, submission of specimen other than nasopharyngeal swab, presence of viral mutation(s) within the areas targeted by this assay, and inadequate number of viral copies(<138 copies/mL). A negative result must be combined with clinical observations, patient history, and epidemiological information. The expected result is Negative.  Fact Sheet for Patients:  EntrepreneurPulse.com.au  Fact Sheet for Healthcare Providers:  IncredibleEmployment.be  This test is no t yet approved or cleared by the [place] FDA and  has been authorized for detection and/or diagnosis of SARS-CoV-2 by FDA under an Emergency Use Authorization (EUA). This EUA will remain  in effect (meaning this test can be used) for the duration of the COVID-19 declaration under Section 564(b)(1) of the Act, 21 U.S.C.section 360bbb-3(b)(1), unless the authorization is terminated  or revoked sooner.        Influenza A by PCR NEGATIVE NEGATIVE Final   Influenza B by PCR NEGATIVE NEGATIVE Final    Comment: (NOTE) The Xpert Xpress SARS-CoV-2/FLU/RSV plus assay is intended as an aid in the diagnosis of influenza from Nasopharyngeal swab specimens and should not be used as a sole basis for treatment. Nasal washings and aspirates are unacceptable for Xpert Xpress SARS-CoV-2/FLU/RSV testing.  Fact  Sheet for Patients: EntrepreneurPulse.com.au  Fact Sheet for Healthcare Providers: IncredibleEmployment.be  This test is not yet approved or cleared by the [place] FDA and has been authorized for detection and/or diagnosis of SARS-CoV-2 by FDA under an Emergency Use Authorization (EUA). This EUA will remain in effect (meaning this test can be used) for the duration of the COVID-19 declaration under Section 564(b)(1) of the Act, 21 U.S.C. section 360bbb-3(b)(1), unless the authorization is terminated or revoked.  Performed at [facility], [place] [address], [place], [place] [address]   Surgical pcr screen     Status: None   Collection Time: 10/08/20  9:05 PM   Specimen: Nasal Mucosa; Nasal Swab  Result Value Ref Range Status   MRSA, PCR NEGATIVE NEGATIVE Final   Staphylococcus aureus NEGATIVE NEGATIVE Final    Comment: (NOTE) The Xpert SA Assay (FDA approved for NASAL specimens in patients 62 years of age and older), is one component of a comprehensive surveillance program. It is not intended to diagnose infection nor to guide or monitor treatment. Performed at [facility], [place] [address], [place], [place] [address]          Radiology Studies: DG Chest Port 1 View  Result Date: 10/09/2020 CLINICAL DATA:  Status post vascular surgical bypass and placement central line. EXAM: PORTABLE CHEST 1 VIEW COMPARISON:  05/10/2020 FINDINGS: Right jugular central line present with the catheter tip located in the lower SVC. The heart  size and mediastinal contours are within normal limits. There is no evidence of pulmonary edema, consolidation, pneumothorax or pleural fluid. The visualized skeletal structures are unremarkable. IMPRESSION: Central line tip in SVC.  No pneumothorax or other acute findings. Electronically Signed   By: Aletta Edouard M.D.   On: 10/09/2020 12:45      Scheduled Meds:  Chlorhexidine Gluconate Cloth  6 each Topical Daily   [START ON 10/10/2020] docusate sodium  100 mg Oral Daily   fentaNYL       heparin  5,000 Units Subcutaneous Q8H   labetalol       pantoprazole  40 mg Oral Daily   Continuous Infusions:  sodium chloride      ceFAZolin (ANCEF) IV     dextrose 5 % and 0.45 % NaCl with KCl 20 mEq/L     magnesium sulfate bolus IVPB 2 g (10/09/20 1411)     LOS: 4 days      Debbe Odea, MD [facility] Pager: [email or web address] 10/09/2020, 2:36 PM

## 2020-10-09 NOTE — Transfer of Care (Signed)
Immediate Anesthesia Transfer of Care Note  Patient: [name].  Procedure(s) Performed: AORTA BIFEMORAL BYPASS GRAFT (Bilateral) ENDARTERECTOMY COMMON FEMORAL (Bilateral: Groin) APPLICATION OF CELL SAVER (Abdomen)  Patient Location: PACU  Anesthesia Type:General  Level of Consciousness: awake  Airway & Oxygen Therapy: Patient Spontanous Breathing and Patient connected to face mask oxygen  Post-op Assessment: Report given to RN and Post -op Vital signs reviewed and stable  Post vital signs: Reviewed and stable  Last Vitals:  Vitals Value Taken Time  BP 175/88 10/09/20 1140  Temp    Pulse 78 10/09/20 1150  Resp 13 10/09/20 1150  SpO2 100 % 10/09/20 1150  Vitals shown include unvalidated device data.  Last Pain:  Vitals:   10/09/20 0349  TempSrc: Oral  PainSc:       Patients Stated Pain Goal: 0 ([id number])  Complications: No notable events documented.

## 2020-10-09 NOTE — Anesthesia Procedure Notes (Signed)
Procedure Name: Intubation Date/Time: 10/09/2020 7:54 AM Performed by: Reece Agar, CRNA Pre-anesthesia Checklist: Patient identified, Emergency Drugs available, Suction available and Patient being monitored Patient Re-evaluated:Patient Re-evaluated prior to induction Oxygen Delivery Method: Circle System Utilized Preoxygenation: Pre-oxygenation with 100% oxygen Induction Type: IV induction Ventilation: Mask ventilation without difficulty and Oral airway inserted - appropriate to patient size Laryngoscope Size: Mac and 3 Grade View: Grade I Tube type: Oral Tube size: 7.5 mm Number of attempts: 1 Airway Equipment and Method: Stylet and Oral airway Placement Confirmation: ETT inserted through vocal cords under direct vision, positive ETCO2 and breath sounds checked- equal and bilateral Secured at: 21 cm Tube secured with: Tape Dental Injury: Teeth and Oropharynx as per pre-operative assessment

## 2020-10-09 NOTE — Anesthesia Procedure Notes (Signed)
Central Venous Catheter Insertion Performed by: Audry Pili, MD, anesthesiologist Start/End9/15/2022 7:03 AM, 10/09/2020 7:11 AM Patient location: Pre-op. Preanesthetic checklist: patient identified, IV checked, risks and benefits discussed, surgical consent, monitors and equipment checked, pre-op evaluation, timeout performed and anesthesia consent Position: Trendelenburg Lidocaine 1% used for infiltration and patient sedated Hand hygiene performed , maximum sterile barriers used  and Seldinger technique used Catheter size: 8 Fr Central line was placed.Double lumen Procedure performed using ultrasound guided technique. Ultrasound Notes:anatomy identified, needle tip was noted to be adjacent to the nerve/plexus identified, no ultrasound evidence of intravascular and/or intraneural injection and image(s) printed for medical record Attempts: 1 Following insertion, line sutured, dressing applied and Biopatch. Post procedure assessment: blood return through all ports, free fluid flow and no air  Patient tolerated the procedure well with no immediate complications.

## 2020-10-09 NOTE — H&P (Signed)
[facility] PROGRESS NOTE  ASSESSMENT / PLAN: [name]. is a 60 y.o. male with critical limb ischemia of left lower extremity. Plan aortobifemoral bypass with bilateral femoral endarterectomies in OR today. All risks / benefits / alternatives discussed.    SUBJECTIVE: No questions. Ready for OR.  OBJECTIVE: BP 101/67 (BP Location: Right Arm)   Pulse 90   Temp 98.3 F (36.8 C) (Oral)   Resp 18   Ht '5\' 1"'$  (1.549 m)   Wt 46 kg   SpO2 100%   BMI 19.16 kg/m   Intake/Output Summary (Last 24 hours) at 10/09/2020 0714 Last data filed at 10/09/2020 [id number] Gross per 24 hour  Intake 240 ml  Output 1375 ml  Net -1135 ml    No distress RRR Unlabored Soft abdomen  CBC Latest Ref Rng & Units 10/09/2020 10/08/2020 10/07/2020  WBC 4.0 - 10.5 K/uL 5.5 5.1 4.1  Hemoglobin 13.0 - 17.0 g/dL 13.6 13.4 12.4(L)  Hematocrit 39.0 - 52.0 % 41.6 42.1 37.7(L)  Platelets 150 - 400 K/uL 255 234 201     CMP Latest Ref Rng & Units 10/09/2020 10/08/2020 10/07/2020  Glucose 70 - 99 mg/dL 110(H) 81 91  BUN 8 - 23 mg/dL '12 11 11  '$ Creatinine 0.61 - 1.24 mg/dL 1.13 1.07 1.05  Sodium 135 - 145 mmol/L 134(L) 136 133(L)  Potassium 3.5 - 5.1 mmol/L 3.7 5.0 3.6  Chloride 98 - 111 mmol/L 100 97(L) 100  CO2 22 - 32 mmol/L '26 29 24  '$ Calcium 8.9 - 10.3 mg/dL 9.5 10.0 9.2  Total Protein 6.5 - 8.1 g/dL - - -  Total Bilirubin 0.3 - 1.2 mg/dL - - -  Alkaline Phos 38 - 126 U/L - - -  AST 15 - 41 U/L - - -  ALT 0 - 44 U/L - - -    Estimated Creatinine Clearance: 44.7 mL/min (by C-G formula based on SCr of 1.13 mg/dL).   [name]. Stanford Breed, MD [facility] Phone Number: [phone] 10/09/2020 7:14 AM

## 2020-10-09 NOTE — Plan of Care (Signed)
  Problem: Clinical Measurements: Goal: Respiratory complications will improve Outcome: Progressing Goal: Cardiovascular complication will be avoided Outcome: Progressing   

## 2020-10-09 NOTE — Anesthesia Procedure Notes (Signed)
Arterial Line Insertion Start/End9/15/2022 7:00 AM, 10/09/2020 7:10 AM Performed by: Reece Agar, CRNA, CRNA  Patient location: Pre-op. Preanesthetic checklist: patient identified, IV checked, site marked, risks and benefits discussed, surgical consent, monitors and equipment checked, pre-op evaluation, timeout performed and anesthesia consent Lidocaine 1% used for infiltration Left, radial was placed Catheter size: 20 G Hand hygiene performed , maximum sterile barriers used  and Seldinger technique used Allen's test indicative of satisfactory collateral circulation Attempts: 2 Procedure performed without using ultrasound guided technique. Following insertion, dressing applied and Biopatch. Post procedure assessment: normal and unchanged  Patient tolerated the procedure well with no immediate complications.

## 2020-10-09 NOTE — Op Note (Signed)
DATE OF SERVICE: 10/09/2020  PATIENT:  [name].  60 y.o. 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  ANESTHESIA:   general  EBL: 3070m URINE OUTPUT: 30074mBLOOD ADMINISTERED:none  DRAINS: none   LOCAL MEDICATIONS USED:  NONE  SPECIMEN:  none  COUNTS: confirmed correct.  TOURNIQUET:  none  PATIENT DISPOSITION:  ICU - extubated and stable.   Delay start of Pharmacological VTE agent (>24hrs) due to surgical blood loss or risk of bleeding: no  INDICATION FOR PROCEDURE: [name] a 60 17o. male with aortoiliac occlusive disease and bilateral common femoral artery occlusive disease.  He had multiple endovascular interventions in the past which have failed.  He had previous common iliac stenting.  He is developed disease about the stents.  I felt given his young age that direct repair would be the most durable option. After careful discussion of risks, benefits, and alternatives the patient was offered aortobifemoral bypass with bilateral common femoral artery endarterectomy. We specifically discussed risk of death, stroke, pneumonia, heart attack, renal dysfunction. The patient understood and wished to proceed.  OPERATIVE FINDINGS: Unremarkable bilateral common femoral artery exposure.  Aorta exposed at the takeoff of the renal arteries to its bifurcation.  Unremarkable end-to-end proximal anastomosis.  14 x 7 mm bifurcated Dacron graft used.  Distal stump  oversewn just proximal to the inferior mesenteric artery.  Anatomic tunnel created bluntly over the iliac arteries taking great care to avoid injury to the retroperitoneal structures.  Both common femoral arteries required endarterectomy.  On the left part of the existing superficial femoral artery stent was debrided and excised.  The heads of the distal anastomoses were sewn to the arteriotomy assist patch angioplasties for the femoral endarterectomies.  Excellent Doppler flow was noted in the bilateral profunda femoris arteries at completion.  Right greater than left Doppler flow was noted in the tibial arteries (right PT, left AT) which was expected given his infrainguinal disease.  DESCRIPTION OF PROCEDURE: After identification of the patient in the pre-operative holding area, the patient was transferred to the operating room. The patient was positioned supine on the operating room table. Anesthesia was induced. The abdomen, groins, and bilateral lower extremities were prepped and draped in standard fashion. A surgical pause was performed confirming correct patient, procedure, and operative location.  Longitudinal incision was made in the right groin over the course of the common femoral artery.  Incision was carried down through subtenons tissue until the femoral sheath was encountered and divided longitudinally.  Common femoral artery was identified and skeletonized throughout its length.  The superficial femoral artery and profunda femoris arteries were skeletonized.  Exposure was carried cranially to and under the inguinal ligament.  The circumflex iliac vein was identified, with 2-0 silk ligature, and sharply divided.  A blunt plane was developed over the external iliac artery into the pelvis.  Longitudinal incision was made in the left groin over the course of the common femoral artery.  Incision was carried down through subtenons tissue until the femoral sheath  was encountered and divided  longitudinally.  Common femoral artery was identified and skeletonized throughout its length.  The superficial femoral artery and profunda femoris arteries were skeletonized.  Exposure was carried cranially to and under the inguinal ligament.  The circumflex iliac vein was identified, with 2-0 silk ligature, and sharply divided.  A blunt plane was developed over the external iliac artery into the pelvis.  Midline laparotomy was made with a 10 blade.  This was carried down through subtenons tissue until the anterior rectus fascia identified and divided at the decussation of fibers.  The fascia was entered in the midline.  The peritoneal envelope was incised sharply the remainder of the abdomen was opened taking great care to avoid injury to the viscera.  Saline moistened towels were placed to protect the abdominal wall.  A Balfour self-retaining retractor was used to spread the abdomen.  The abdomen was explored.  Blunt dissection was performed to expose the aorta and venous structures in the retroperitoneum.  We identified the inferior mesenteric vein, left gonadal vein, left renal vein.  An Omni-Tract retractor system was brought onto the field and put into position to allow for exposure.  The transverse colon was tethered to the retroperitoneum and did not need to be eviscerated.  The omentum was diminutive and could not be used as a onlay for the retroperitoneum.  The small bowel was eviscerated and a fence retractor was used to expose the aorta and the retroperitoneum.  The duodenum was mobilized.  Splenic retractor was used to hold the transverse colon and duodenum cranially.  The anterior surface of the aorta was exposed from the renal vein to the aortic bifurcation.  Circumferential control of the aorta immediately below the renal arteries was achieved and a Harkenson clamp was slotted proximally.  An aortic cross-clamp was slotted to the aorta just above the inferior mesenteric artery.  Blunt  retroperitoneal tunnels were created using digital dissection over the anterior surface of the external iliac artery and common iliac arteries.  Red rubber catheters were delivered through the retroperitoneal tunnels to hold these tunnels open.  The patient was systemically heparinized.  Activated clotting time measurements were used throughout the case to confirm adequate anticoagulation.  After our first therapeutic anticoagulation result, we communicated with the anesthesia team and crossclamped the aorta proximally distally.  The aorta was transected several centimeters distal to our proximal clamp.  About 2 cm of aorta were debrided and excised.  The distal stump was oversewn with 2 layer 3-0 Prolene closure.  A 14 x 7 mm dacryon bifurcated aortic graft was brought onto the field a felt strip was cut to buttress our proximal anastomosis.  The graft was cut to lay as a reconstruction of the aortic bifurcation.  Continuous running suture of 3-0 Prolene was used to anastomose the infrarenal aorta to the graft.  The felt strip was incorporated into the repair.  Immediately prior to completion the suture line was snugged down with a nerve hook.  The anastomosis was completed.  The proximal clamp was slowly released and hemostasis achieved in the proximal anastomosis.  No revision was required.  The limbs of the graft were sewn to the red rubber catheters and delivered through the retroperitoneal tunnel into our femoral artery exposures.  Clamps were applied to the ends of the aortic graft.  Clamps were applied to the right external iliac artery, profunda femoris artery, and superficial femoral artery.  An anterior arteriotomy was made with 11 blade extended with  Potts scissors.  A femoral endarterectomy was performed using a Primary school teacher.  Luminal gain was achieved.  The right limb of the aortic graft was cut to lay without redundancy or tension and spatulated to allow end-to-side anastomosis.  The cut end of  the graft was then anastomosed to the arteriotomy using continuous running suture of 5-0 Prolene.  Immediately prior to completion the anastomosis was flushed and de-aired.  Clamps were sequentially released on the anastomosis.  Hemostasis was achieved in the anastomosis and surgical bed.  Clamps were applied to the left external iliac artery, profunda femoris artery, and superficial femoral artery.  An anterior arteriotomy was made with 11 blade extended with Potts scissors.  A femoral endarterectomy was performed using a Primary school teacher.  Luminal gain was achieved.  Previously placed superficial femoral artery was abutting the origin of the left profunda femoris artery.  We debrided several centimeters of the stent and extended our arteriotomy onto the origin of the profunda femoris artery.  The left limb of the aortic graft was cut to lay without redundancy or tension and spatulated to allow end-to-side anastomosis.  The cut end of the graft was then anastomosed to the arteriotomy using continuous running suture of 5-0 Prolene.  Immediately prior to completion the anastomosis was flushed and de-aired.  Clamps were sequentially released on the anastomosis.  Hemostasis was achieved in the anastomosis and surgical bed.  Doppler machine was used to evaluate her revascularization.  Excellent Doppler flow was noted in bilateral profunda femoris arteries with low resistance waveforms.  A right posterior tibial artery Doppler signal was heard.  A left anterior tibial artery Doppler signal was heard.  Heparin was reversed with protamine.  Hemostasis was again confirmed in the proximal and distal anastomoses.  The surgical beds were copiously irrigated.  There was very little retroperitoneum to close over the aortic graft, but I did get some fatty tissue over the proximal anastomosis.  The intestines were returned to the abdomen.  The abdominal fascia was closed using interrupted 0 Vicryl internal retention sutures  and a running suture of #1 PDS.  The skin was reapproximated with 3-0 Vicryl sutures.  A 4-0 Monocryl subcuticular suture was used.  The groins were closed in layers using 2-0 Vicryl, 3-0 Vicryl, 4-0 Monocryl.  Dermabond was applied to all the incisions.  The orogastric tube was removed at the end of the case.  Upon completion of the case instrument and sharps counts were confirmed correct. The patient was transferred to the PACU in good condition. I was present for all portions of the procedure.  [name], MD [facility] Phone Number: [phone] 10/09/2020 11:04 AM

## 2020-10-09 NOTE — Anesthesia Postprocedure Evaluation (Signed)
Anesthesia Post Note  Patient: Symir Flitter.  Procedure(s) Performed: AORTA BIFEMORAL BYPASS GRAFT (Bilateral) ENDARTERECTOMY COMMON FEMORAL (Bilateral: Groin) APPLICATION OF CELL SAVER (Abdomen)     Patient location during evaluation: PACU Anesthesia Type: General Level of consciousness: awake and alert Pain management: pain level controlled Vital Signs Assessment: post-procedure vital signs reviewed and stable Respiratory status: spontaneous breathing, nonlabored ventilation and respiratory function stable Cardiovascular status: stable and blood pressure returned to baseline Anesthetic complications: no   No notable events documented.  Last Vitals:  Vitals:   10/09/20 1500 10/09/20 1515  BP: 117/65 (!) 110/59  Pulse: 79 80  Resp: 13 12  Temp:    SpO2: 100% 100%    Last Pain:  Vitals:   10/09/20 1430  TempSrc: Oral  PainSc: 0-No pain                 Audry Pili

## 2020-10-09 NOTE — Progress Notes (Signed)
   S/P open aorto-bi-femoral bypass for aortoiliac occlusive disease and bilateral common femoral artery occlusive disease.  Doppler signals right PT, Left LE AT Incisions healing well abdomin and B groins without hematoma. NPO  Stable post op Heparin for DVT prophylaxis HGB stable 11.0 Low Mg 1.5 being replaced LABS ordered for am  Roxy Horseman PA-C

## 2020-10-10 ENCOUNTER — Encounter (HOSPITAL_COMMUNITY): Payer: Self-pay | Admitting: Vascular Surgery

## 2020-10-10 DIAGNOSIS — E119 Type 2 diabetes mellitus without complications: Secondary | ICD-10-CM | POA: Diagnosis not present

## 2020-10-10 DIAGNOSIS — F17210 Nicotine dependence, cigarettes, uncomplicated: Secondary | ICD-10-CM | POA: Diagnosis not present

## 2020-10-10 DIAGNOSIS — I10 Essential (primary) hypertension: Secondary | ICD-10-CM | POA: Diagnosis not present

## 2020-10-10 DIAGNOSIS — Z95828 Presence of other vascular implants and grafts: Secondary | ICD-10-CM

## 2020-10-10 DIAGNOSIS — I70229 Atherosclerosis of native arteries of extremities with rest pain, unspecified extremity: Secondary | ICD-10-CM | POA: Diagnosis not present

## 2020-10-10 LAB — COMPREHENSIVE METABOLIC PANEL
ALT: 13 U/L (ref 0–44)
AST: 18 U/L (ref 15–41)
Albumin: 3.5 g/dL (ref 3.5–5.0)
Alkaline Phosphatase: 50 U/L (ref 38–126)
Anion gap: 8 (ref 5–15)
BUN: 9 mg/dL (ref 8–23)
CO2: 23 mmol/L (ref 22–32)
Calcium: 8.9 mg/dL (ref 8.9–10.3)
Chloride: 99 mmol/L (ref 98–111)
Creatinine, Ser: 0.89 mg/dL (ref 0.61–1.24)
GFR, Estimated: >60 mL/min (ref >60)
Glucose, Bld: 125 mg/dL — ABNORMAL HIGH (ref 70–99)
Potassium: 4.2 mmol/L (ref 3.5–5.1)
Sodium: 130 mmol/L — ABNORMAL LOW (ref 135–145)
Total Bilirubin: 0.5 mg/dL (ref 0.3–1.2)
Total Protein: 6.1 g/dL — ABNORMAL LOW (ref 6.5–8.1)

## 2020-10-10 LAB — GLUCOSE, CAPILLARY
Glucose-Capillary: 117 mg/dL — ABNORMAL HIGH (ref 70–99)
Glucose-Capillary: 148 mg/dL — ABNORMAL HIGH (ref 70–99)
Glucose-Capillary: 108 mg/dL — ABNORMAL HIGH (ref 70–99)
Glucose-Capillary: 115 mg/dL — ABNORMAL HIGH (ref 70–99)
Glucose-Capillary: 123 mg/dL — ABNORMAL HIGH (ref 70–99)

## 2020-10-10 LAB — CBC
HCT: 32.1 % — ABNORMAL LOW (ref 39.0–52.0)
Hemoglobin: 10.4 g/dL — ABNORMAL LOW (ref 13.0–17.0)
MCH: 29.3 pg (ref 26.0–34.0)
MCHC: 32.4 g/dL (ref 30.0–36.0)
MCV: 90.4 fL (ref 80.0–100.0)
Platelets: 248 10*3/uL (ref 150–400)
RBC: 3.55 MIL/uL — ABNORMAL LOW (ref 4.22–5.81)
RDW: 16.2 % — ABNORMAL HIGH (ref 11.5–15.5)
WBC: 9.6 10*3/uL (ref 4.0–10.5)
nRBC: 0 % (ref 0.0–0.2)

## 2020-10-10 LAB — CULTURE, BLOOD (ROUTINE X 2)
Culture: NO GROWTH
Special Requests: ADEQUATE

## 2020-10-10 LAB — AMYLASE: Amylase: 60 U/L (ref 28–100)

## 2020-10-10 LAB — MAGNESIUM: Magnesium: 2.1 mg/dL (ref 1.7–2.4)

## 2020-10-10 MED ORDER — DIPHENHYDRAMINE HCL 12.5 MG/5ML PO ELIX: 12.5000 mg | ORAL_SOLUTION | Freq: Four times a day (QID) | ORAL | Status: DC | PRN

## 2020-10-10 MED ORDER — DIPHENHYDRAMINE HCL 50 MG/ML IJ SOLN: 12.5000 mg | Freq: Four times a day (QID) | INTRAMUSCULAR | Status: DC | PRN

## 2020-10-10 MED ORDER — SODIUM CHLORIDE 0.9 % IV SOLN: INTRAVENOUS | Status: DC

## 2020-10-10 MED ORDER — SODIUM CHLORIDE 0.9% FLUSH
9.0000 mL | INTRAVENOUS | Status: DC | PRN
Start: 2020-10-10 — End: 2020-10-11

## 2020-10-10 MED ORDER — NALOXONE HCL 0.4 MG/ML IJ SOLN: 0.4000 mg | INTRAMUSCULAR | Status: DC | PRN

## 2020-10-10 MED ORDER — HYDROMORPHONE 1 MG/ML IV SOLN
INTRAVENOUS | Status: DC
Start: 2020-10-10 — End: 2020-10-11
  Administered 2020-10-10: 0.9 mg via INTRAVENOUS
  Administered 2020-10-11: 1 mg via INTRAVENOUS
  Administered 2020-10-11 (×2): 0 mg via INTRAVENOUS
  Filled 2020-10-10: qty 30

## 2020-10-10 MED ORDER — INSULIN ASPART 100 UNIT/ML IJ SOLN: 0.0000 [IU] | Freq: Three times a day (TID) | INTRAMUSCULAR | Status: DC

## 2020-10-10 NOTE — Evaluation (Signed)
Occupational Therapy Evaluation Patient Details Name: [name]. MRN: [id number] DOB: Jan 04, 1961 Today's Date: 10/10/2020   History of Present Illness The pt is a 60 yo male presenting 9/11 with c/o LLE pain following recent L pinky toe amputation on 5/20. Upon work-up pt LLE found to be cold to the touch with no palpable pulses. Pt s/p ultrasound-guided cannulation of R CFA on 9/12, now s/p aorto-bi-femoral bypass with bilateral endarterectomies on 9/15. PMH includes: DM II, PAD, R SFA arthrectomy and balloon angioplasty 6/21, stenting of L SFA and L CIA 5/22, HLD, HTN, current tobacco and marijuana use.   Clinical Impression   Pt walked with a cane and was independent in self care. He lives with a friend and his brother provides transportation. Pt presents with post operative pain and impaired standing balance.He stood with verbal cues for hand placement and ambulated a few feet with min guard assist, pain prevented progression of mobility. Pt requires set up to min assist for ADL. He will likely progress well and not requires post acute OT.      Recommendations for follow up therapy are one component of a multi-disciplinary discharge planning process, led by the attending physician.  Recommendations may be updated based on patient status, additional functional criteria and insurance authorization.   Follow Up Recommendations  No OT follow up    Equipment Recommendations   (RW)    Recommendations for Other Services       Precautions / Restrictions Precautions Precautions: Fall Restrictions Weight Bearing Restrictions: No      Mobility Bed Mobility               General bed mobility comments: pt received and returned to chair    Transfers Overall transfer level: Needs assistance Equipment used: Rolling walker (2 wheeled) Transfers: Sit to/from Stand Sit to Stand: Min guard         General transfer comment: cues for hand placement    Balance Overall  balance assessment: Needs assistance   Sitting balance-Leahy Scale: Fair     Standing balance support: Bilateral upper extremity supported Standing balance-Leahy Scale: Poor Standing balance comment: reliant on B UE support                           ADL either performed or assessed with clinical judgement   ADL Overall ADL's : Needs assistance/impaired Eating/Feeding: Independent;Sitting   Grooming: Set up;Sitting   Upper Body Bathing: Set up;Sitting   Lower Body Bathing: Minimal assistance;Sit to/from stand   Upper Body Dressing : Set up;Sitting   Lower Body Dressing: Minimal assistance;Sit to/from stand   Toilet Transfer: Min guard;Ambulation;RW   Toileting- Clothing Manipulation and Hygiene: Minimal assistance;Sit to/from stand       Functional mobility during ADLs: Min guard;Rolling walker       Vision Patient Visual Report: No change from baseline       Perception     Praxis      Pertinent Vitals/Pain Pain Assessment: Faces Pain Score: 8  Faces Pain Scale: Hurts whole lot Pain Location: mozstly abdominal/incision, LLE still sore Pain Descriptors / Indicators: Discomfort;Sore Pain Intervention(s): Monitored during session;Repositioned     Hand Dominance Right   Extremity/Trunk Assessment Upper Extremity Assessment Upper Extremity Assessment: Overall WFL for tasks assessed   Lower Extremity Assessment Lower Extremity Assessment: Defer to PT evaluation       Communication Communication Communication: No difficulties   Cognition Arousal/Alertness: Awake/alert Behavior During  Therapy: WFL for tasks assessed/performed Overall Cognitive Status: Within Functional Limits for tasks assessed                                     General Comments       Exercises     Shoulder Instructions      Home Living Family/patient expects to be discharged to:: Private residence Living Arrangements: Non-relatives/Friends Available  Help at Discharge: Friend(s);Available 24 hours/day Type of Home: Apartment Home Access: Stairs to enter [employer] of Steps: flight of stairs Entrance Stairs-Rails: Can reach both Home Layout: One level     Bathroom Shower/Tub: Occupational psychologist: Standard     Home Equipment: Cane - single point          Prior Functioning/Environment Level of Independence: Independent with assistive device(s)        Comments: independent with use of cane, not driving, but goes with his brother for shopping, sedentary lifestyle        OT Problem List: Impaired balance (sitting and/or standing);Decreased knowledge of use of DME or AE;Pain      OT Treatment/Interventions: Self-care/ADL training;DME and/or AE instruction;Therapeutic activities;Patient/family education;Balance training    OT Goals(Current goals can be found in the care plan section) Acute Rehab OT Goals Patient Stated Goal: return home, decrease pain OT Goal Formulation: With patient Time For Goal Achievement: 10/24/20 Potential to Achieve Goals: Good ADL Goals Pt Will Perform Grooming: with modified independence;standing Pt Will Perform Lower Body Bathing: with modified independence;sit to/from stand Pt Will Perform Lower Body Dressing: with modified independence;sit to/from stand Pt Will Transfer to Toilet: with modified independence;ambulating Pt Will Perform Toileting - Clothing Manipulation and hygiene: with modified independence;sit to/from stand Pt Will Perform Tub/Shower Transfer: Shower transfer;with modified independence;ambulating;rolling walker  OT Frequency: Min 2X/week   Barriers to D/C:            Co-evaluation              AM-PAC OT "6 Clicks" Daily Activity     Outcome Measure Help from another person eating meals?: None Help from another person taking care of personal grooming?: A Little Help from another person toileting, which includes using toliet, bedpan, or  urinal?: A Little Help from another person bathing (including washing, rinsing, drying)?: A Little Help from another person to put on and taking off regular upper body clothing?: None Help from another person to put on and taking off regular lower body clothing?: A Little 6 Click Score: 20   End of Session Equipment Utilized During Treatment: Rolling walker  Activity Tolerance: Patient limited by pain Patient left: in chair;with call bell/phone within reach  OT Visit Diagnosis: Pain;Unsteadiness on feet (R26.81);Other abnormalities of gait and mobility (R26.89)                Time: [id number] OT Time Calculation (min): 21 min Charges:  OT General Charges $OT Visit: 1 Visit OT Evaluation $OT Eval Moderate Complexity: 1 Mod  Nestor Lewandowsky, OTR/L Acute Rehabilitation Services Pager: [phone] Office: [phone]   Malka So 10/10/2020, 10:55 AM

## 2020-10-10 NOTE — Progress Notes (Signed)
PROGRESS NOTE    [name].   [id number]  DOB: 09-Apr-1960  DOA: 10/05/2020 PCP: Cipriano Mile, NP   Brief Narrative:  [name]. 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 The patient presented to the ED with severe left leg pain which he said was progressive over the past 2 weeks.  CTA of aorta with iliofemoral runoff revealed a complete occlusion of the stent in the left superficial femoral artery and nonopacification of the left posterior tibial artery.  Vascular surgery consult was requested.   Subjective: Having pain in his abdominal incisions today.     Assessment & Plan:   Principal Problem:   Critical lower limb ischemia, peripheral artery disease Mild lactic acidosis - Lactic acid level noted to be 2.2 on 10/05/2020 -He has been taking Lipitor aspirin and Plavix at home - Continue heparin infusion, aspirin and Lipitor-Plavix is on hold - Continue pain control -Cardiology clearance requested by vascular surgery-the patient underwent a nuclear stress test which is a low risk study-2D echo shows a normal LV function-- - Risk factor modifications discussed with patient - 9/15> s/p aroto-bi-femoral bypass, left and right femoral endarterectomy  - incisions appear clean- ok to move out of ICU today - Dilaudid infusion per surgical team  Active Problems:  Hypomagnesemia - has been replaced IV     Type 2 diabetes mellitus with peripheral neuropathy - he is no on medications at home -Continue sliding scale NovoLog and gabapentin - Hemoglobin A1C    Component Value Date/Time   HGBA1C 5.5 10/06/2020 0537      Nicotine dependence, cigarettes, uncomplicated -Has been counseled on quitting smoking  especially in light of his current extensive vascular issues  Hypertension - BP has been low to normal -holding lisinopril/HCTZ in the perioperative period -continue as needed hydralazine  IVF: LR being given at 75 cc /hr per surgery- he is current NPO until he has a BM   Time spent in minutes: 35 DVT prophylaxis: heparin Code Status: Full code Family Communication:  Level of Care: Level of care: Progressive Disposition Plan:  Status is: Inpatient  Remains inpatient appropriate because:Inpatient level of care appropriate due to severity of illness  Dispo: The patient is from: Home              Anticipated d/c is to: Home              Patient currently is not medically stable to d/c.   Difficult to place patient No  Consultants:  Vascular surgery Cardiology  Procedures:  Nuclear stress test Aortogram with bilateral lower extremity runoff Antimicrobials:  Anti-infectives (From admission, onward)    Start     Dose/Rate Route Frequency Ordered Stop   10/09/20 1345  ceFAZolin (ANCEF) IVPB 2g/100 mL premix        2 g 200 mL/hr over 30 Minutes Intravenous Every 8 hours 10/09/20 1333 10/09/20 2333   10/09/20 0600  ceFAZolin (ANCEF) IVPB 1 g/50 mL premix        1 g 100 mL/hr over 30 Minutes Intravenous To Short Stay 10/08/20 0825 10/09/20 0815        Objective: Vitals:   10/10/20 1220 10/10/20 1300 10/10/20 1400 10/10/20 1415  BP:  124/78 137/74   Pulse: 84 76 73  Resp: (!) '9 10 11   '$ Temp:      TempSrc:      SpO2: 98% 99% 98%   Weight:      Height:    '5\' 2"'$  (1.575 m)    Intake/Output Summary (Last 24 hours) at 10/10/2020 1539 Last data filed at 10/10/2020 1226 Gross per 24 hour  Intake 2504.79 ml  Output 990 ml  Net 1514.79 ml    Filed Weights   10/05/20 1641  Weight: 46 kg    Examination: General exam: Appears comfortable  HEENT: PERRLA, oral mucosa moist, no sclera icterus or thrush Respiratory system: Clear to auscultation. Respiratory effort  normal. Cardiovascular system: S1 & S2 heard, regular rate and rhythm Gastrointestinal system: Abdomen soft, non-tender, nondistended. Normal bowel sounds   Central nervous system: Alert and oriented. No focal neurological deficits. Extremities: No cyanosis, clubbing or edema Skin: No rashes or ulcers- abdominal incisions intact Psychiatry:  Mood & affect appropriate.      Data Reviewed: I have personally reviewed following labs and imaging studies  CBC: Recent Labs  Lab 10/05/20 1712 10/06/20 0537 10/07/20 0143 10/08/20 0213 10/09/20 0226 10/09/20 1020 10/09/20 1216 10/10/20 0316  WBC 5.1 4.9 4.1 5.1 5.5  --  16.5* 9.6  NEUTROABS 2.7 2.0  --   --   --   --   --   --   HGB 15.1 12.8* 12.4* 13.4 13.6 10.5* 11.0* 10.4*  HCT 46.1 39.8 37.7* 42.1 41.6 31.0* 34.4* 32.1*  MCV 90.0 89.6 89.1 90.7 88.7  --  90.1 90.4  PLT 212 211 201 234 255  --  227 [id number]    Basic Metabolic Panel: Recent Labs  Lab 10/06/20 0537 10/07/20 0143 10/08/20 0213 10/09/20 0226 10/09/20 1020 10/09/20 1216 10/10/20 0316  NA 135 133* 136 134* 136 131* 130*  K 3.6 3.6 5.0 3.7 3.1* 3.5 4.2  CL 103 100 97* 100  --  98 99  CO2 '23 24 29 26  '$ --  24 23  GLUCOSE 92 91 81 110*  --  189* 125*  BUN '19 11 11 12  '$ --  9 9  CREATININE 1.01 1.05 1.07 1.13  --  0.91 0.89  CALCIUM 9.1 9.2 10.0 9.5  --  8.5* 8.9  MG 2.1  --   --   --   --  1.5* 2.1    GFR: Estimated Creatinine Clearance: 56.7 mL/min (by C-G formula based on SCr of 0.89 mg/dL). Liver Function Tests: Recent Labs  Lab 10/06/20 0537 10/10/20 0316  AST 21 18  ALT 20 13  ALKPHOS 68 50  BILITOT 0.7 0.5  PROT 6.7 6.1*  ALBUMIN 3.4* 3.5    Recent Labs  Lab 10/10/20 0316  AMYLASE 60   No results for input(s): AMMONIA in the last 168 hours. Coagulation Profile: Recent Labs  Lab 10/05/20 2007 10/09/20 1216  INR 1.1 1.1    Cardiac Enzymes: No results for input(s): CKTOTAL, CKMB, CKMBINDEX, TROPONINI in the last 168 hours. BNP (last 3  results) No results for input(s): PROBNP in the last 8760 hours. HbA1C: No results for input(s): HGBA1C in the last 72 hours.  CBG: Recent Labs  Lab 10/09/20 1952 10/09/20 2357 10/10/20 0408 10/10/20 0743 10/10/20 1125  GLUCAP 203* 137* 117* 148* 108*    Lipid Profile: No results for input(s): CHOL, HDL, LDLCALC, TRIG, CHOLHDL, LDLDIRECT in the last 72 hours.  Thyroid Function Tests: No results for input(s): TSH, T4TOTAL, FREET4, T3FREE, THYROIDAB in the last 72  hours. Anemia Panel: No results for input(s): VITAMINB12, FOLATE, FERRITIN, TIBC, IRON, RETICCTPCT in the last 72 hours. Urine analysis:    Component Value Date/Time   COLORURINE STRAW (A) 05/10/2020 1722   APPEARANCEUR CLEAR 05/10/2020 1722   LABSPEC 1.008 05/10/2020 1722   PHURINE 8.0 05/10/2020 1722   GLUCOSEU NEGATIVE 05/10/2020 1722   HGBUR NEGATIVE 05/10/2020 1722   BILIRUBINUR NEGATIVE 05/10/2020 1722   KETONESUR 5 (A) 05/10/2020 1722   PROTEINUR NEGATIVE 05/10/2020 1722   NITRITE NEGATIVE 05/10/2020 1722   LEUKOCYTESUR NEGATIVE 05/10/2020 1722   Sepsis Labs: '@LABRCNTIP'$ (procalcitonin:4,lacticidven:4) ) Recent Results (from the past 240 hour(s))  Blood culture (routine x 2)     Status: None   Collection Time: 10/05/20  5:13 PM   Specimen: BLOOD  Result Value Ref Range Status   Specimen Description   Final    BLOOD LEFT ANTECUBITAL Performed at [facility], [place] [address] [place], [place] [address]    Special Requests   Final    BOTTLES DRAWN AEROBIC AND ANAEROBIC Blood Culture adequate volume Performed at [facility] [address], [place], [place] [address]    Culture   Final    NO GROWTH 5 DAYS Performed at [facility], [place] [address], [place], [place] [address]    Report Status 10/10/2020 FINAL  Final  Blood culture (routine x 2)     Status: None (Preliminary result)   Collection Time: 10/05/20  8:01 PM   Specimen: BLOOD  Result Value Ref  Range Status   Specimen Description   Final    BLOOD LEFT ANTECUBITAL Performed at [facility] [address], [place], [place] [address]    Special Requests   Final    BOTTLES DRAWN AEROBIC AND ANAEROBIC Blood Culture adequate volume Performed at [facility] [address] [place], [place] [address]    Culture   Final    NO GROWTH 4 DAYS Performed at [facility], [place] [address] [place], [place] [address]    Report Status PENDING  Incomplete  Resp Panel by RT-PCR (Flu A&B, Covid) Nasopharyngeal Swab     Status: None   Collection Time: 10/05/20  8:04 PM   Specimen: Nasopharyngeal Swab; Nasopharyngeal(NP) swabs in vial transport medium  Result Value Ref Range Status   SARS Coronavirus 2 by RT PCR NEGATIVE NEGATIVE Final    Comment: (NOTE) SARS-CoV-2 target nucleic acids are NOT DETECTED.  The SARS-CoV-2 RNA is generally detectable in upper respiratory specimens during the acute phase of infection. The lowest concentration of SARS-CoV-2 viral copies this assay can detect is 138 copies/mL. A negative result does not preclude SARS-Cov-2 infection and should not be used as the sole basis for treatment or other patient management decisions. A negative result may occur with  improper specimen collection/handling, submission of specimen other than nasopharyngeal swab, presence of viral mutation(s) within the areas targeted by this assay, and inadequate number of viral copies(<138 copies/mL). A negative result must be combined with clinical observations, patient history, and epidemiological information. The expected result is Negative.  Fact Sheet for Patients:  EntrepreneurPulse.com.au  Fact Sheet for Healthcare Providers:  IncredibleEmployment.be  This test is no t yet approved or cleared by the [place] FDA and  has been authorized for detection and/or diagnosis of SARS-CoV-2 by FDA under an  Emergency Use Authorization (EUA). This EUA will remain  in effect (meaning this test can be used) for the duration of the  COVID-19 declaration under Section 564(b)(1) of the Act, 21 U.S.C.section 360bbb-3(b)(1), unless the authorization is terminated  or revoked sooner.       Influenza A by PCR NEGATIVE NEGATIVE Final   Influenza B by PCR NEGATIVE NEGATIVE Final    Comment: (NOTE) The Xpert Xpress SARS-CoV-2/FLU/RSV plus assay is intended as an aid in the diagnosis of influenza from Nasopharyngeal swab specimens and should not be used as a sole basis for treatment. Nasal washings and aspirates are unacceptable for Xpert Xpress SARS-CoV-2/FLU/RSV testing.  Fact Sheet for Patients: EntrepreneurPulse.com.au  Fact Sheet for Healthcare Providers: IncredibleEmployment.be  This test is not yet approved or cleared by the [place] FDA and has been authorized for detection and/or diagnosis of SARS-CoV-2 by FDA under an Emergency Use Authorization (EUA). This EUA will remain in effect (meaning this test can be used) for the duration of the COVID-19 declaration under Section 564(b)(1) of the Act, 21 U.S.C. section 360bbb-3(b)(1), unless the authorization is terminated or revoked.  Performed at [facility], [place] [address], [place], [place] [address]   Surgical pcr screen     Status: None   Collection Time: 10/08/20  9:05 PM   Specimen: Nasal Mucosa; Nasal Swab  Result Value Ref Range Status   MRSA, PCR NEGATIVE NEGATIVE Final   Staphylococcus aureus NEGATIVE NEGATIVE Final    Comment: (NOTE) The Xpert SA Assay (FDA approved for NASAL specimens in patients 56 years of age and older), is one component of a comprehensive surveillance program. It is not intended to diagnose infection nor to guide or monitor treatment. Performed at [facility], [place] [address] [place],  [address]          Radiology  Studies: DG Chest Port 1 View  Result Date: 10/09/2020 CLINICAL DATA:  Status post vascular surgical bypass and placement central line. EXAM: PORTABLE CHEST 1 VIEW COMPARISON:  05/10/2020 FINDINGS: Right jugular central line present with the catheter tip located in the lower SVC. The heart size and mediastinal contours are within normal limits. There is no evidence of pulmonary edema, consolidation, pneumothorax or pleural fluid. The visualized skeletal structures are unremarkable. IMPRESSION: Central line tip in SVC.  No pneumothorax or other acute findings. Electronically Signed   By: Aletta Edouard M.D.   On: 10/09/2020 12:45      Scheduled Meds:  acetaminophen  650 mg Oral Q6H   bisacodyl  10 mg Rectal Daily   Chlorhexidine Gluconate Cloth  6 each Topical Daily   docusate sodium  200 mg Oral BID   heparin  5,000 Units Subcutaneous Q8H   HYDROmorphone   Intravenous Q4H   insulin aspart  0-9 Units Subcutaneous TID WC   pantoprazole  40 mg Oral Daily   Continuous Infusions:  lactated ringers 75 mL/hr at 10/10/20 1226     LOS: 5 days      Debbe Odea, MD [facility] Pager: [email or web address] 10/10/2020, 3:39 PM

## 2020-10-10 NOTE — Plan of Care (Signed)

## 2020-10-10 NOTE — Evaluation (Signed)
Physical Therapy Evaluation Patient Details Name: [name]. MRN: [id number] DOB: 06-24-1960 Today's Date: 10/10/2020  History of Present Illness  The pt is a 60 yo male presenting 9/11 with c/o LLE pain following recent L pinky toe amputation on 5/20. Upon work-up pt LLE found to be cold to the touch with no palpable pulses. Pt s/p ultrasound-guided cannulation of R CFA on 9/12, now s/p aorto-bi-femoral bypass with bilateral endarterectomies on 9/15. PMH includes: DM II, PAD, R SFA arthrectomy and balloon angioplasty 6/21, stenting of L SFA and L CIA 5/22, HLD, HTN, current tobacco and marijuana use.   Clinical Impression  Pt in recliner upon arrival of PT, agreeable to evaluation at this time. Prior to admission the pt was mobilizing with use of SPC both in the home and in community, living in 2nd floor apt with a friend. The pt now presents with limitations in functional mobility, activity tolerance, dynamic stability, and strength due to above dx and resulting pain, and will continue to benefit from skilled PT to address these deficits. The pt was able to complete sit-stand transfer and short bout of ambulation in the room with minA to minG and use of RW for BUE support, but remained significantly limited by pain at this time. The pt will likely progress well with continued pain control and healing, is hopeful for return home when medically stable. Educated on progressive mobility and smoking cessation strategies, will continue to benefit from skilled PT acutely to progress mobility and complete stair training prior to anticipated d/c home.         Recommendations for follow up therapy are one component of a multi-disciplinary discharge planning process, led by the attending physician.  Recommendations may be updated based on patient status, additional functional criteria and insurance authorization.  Follow Up Recommendations Home health PT;Supervision for mobility/OOB    Equipment  Recommendations  Rolling walker with 5" wheels    Recommendations for Other Services       Precautions / Restrictions Precautions Precautions: Fall Restrictions Weight Bearing Restrictions: No      Mobility  Bed Mobility               General bed mobility comments: pt received and returned to chair    Transfers Overall transfer level: Needs assistance Equipment used: Rolling walker (2 wheeled) Transfers: Sit to/from Stand Sit to Stand: Min guard         General transfer comment: cues for hand placement  Ambulation/Gait Ambulation/Gait assistance: Min guard;+2 safety/equipment Gait Distance (Feet): 5 Feet Assistive device: Rolling walker (2 wheeled) Gait Pattern/deviations: Step-to pattern;Decreased stride length Gait velocity: decreased Gait velocity interpretation: <1.31 ft/sec, indicative of household ambulator General Gait Details: pt with small strides, heavy relaince on RW, and chair follow for safety, pt limited most by pain, trunk flexion for abdominal comfort but no overt LOB      Balance Overall balance assessment: Needs assistance   Sitting balance-Leahy Scale: Fair     Standing balance support: Bilateral upper extremity supported Standing balance-Leahy Scale: Poor Standing balance comment: reliant on B UE support                             Pertinent Vitals/Pain Pain Assessment: 0-10 Pain Score: 8  Faces Pain Scale: Hurts whole lot Pain Location: mostly abdomen/incision, LLE sore especially around R pinky toe amputation Pain Descriptors / Indicators: Discomfort;Sore Pain Intervention(s): Limited activity within patient's tolerance;Monitored during session;Repositioned  Home Living Family/patient expects to be discharged to:: Private residence Living Arrangements: Non-relatives/Friends Available Help at Discharge: Friend(s);Available 24 hours/day Type of Home: Apartment Home Access: Stairs to enter Entrance Stairs-Rails:  Can reach both Entrance Stairs-Number of Steps: flight of stairs Home Layout: One level Home Equipment: Cane - single point      Prior Function Level of Independence: Independent with assistive device(s)         Comments: independent with use of cane, not driving, but goes with his brother for shopping, sedentary lifestyle     Hand Dominance   Dominant Hand: Right    Extremity/Trunk Assessment   Upper Extremity Assessment Upper Extremity Assessment: Defer to OT evaluation    Lower Extremity Assessment Lower Extremity Assessment: RLE deficits/detail;LLE deficits/detail RLE Deficits / Details: pt reports tender and numb to mid-calf bilaterally, scab remains on site of amputation from may, functional ROM, grossly 3+/5 at ankle, 4/5 at knee and hip RLE Sensation: decreased light touch LLE Deficits / Details: pt reports tender and numb to mid-calf bilaterally, scab remains on site of cortizone injection on dorsum of foot, functional ROM, grossly 3+/5 at ankle, 4/5 at knee and hip LLE Sensation: decreased light touch    Cervical / Trunk Assessment Cervical / Trunk Assessment: Normal  Communication   Communication: No difficulties  Cognition Arousal/Alertness: Awake/alert Behavior During Therapy: WFL for tasks assessed/performed Overall Cognitive Status: Within Functional Limits for tasks assessed                                        General Comments General comments (skin integrity, edema, etc.): impaired sensation and wounds on bilateral feet (chronic), pt also educated in smoking cessation strategies        Assessment/Plan    PT Assessment Patient needs continued PT services  PT Problem List Decreased range of motion;Decreased activity tolerance;Decreased strength;Decreased balance;Decreased mobility;Pain;Impaired sensation       PT Treatment Interventions DME instruction;Gait training;Stair training;Therapeutic activities;Functional mobility  training;Therapeutic exercise;Balance training;Patient/family education    PT Goals (Current goals can be found in the Care Plan section)  Acute Rehab PT Goals Patient Stated Goal: return home, decrease pain, stop smoking PT Goal Formulation: With patient Time For Goal Achievement: 10/24/20 Potential to Achieve Goals: Good    Frequency Min 3X/week   Barriers to discharge Inaccessible home environment flight of stairs to enter    Co-evaluation PT/OT/SLP Co-Evaluation/Treatment: Yes Reason for Co-Treatment: For patient/therapist safety;To address functional/ADL transfers (pain/activity tolerance) PT goals addressed during session: Mobility/safety with mobility;Balance;Proper use of DME         AM-PAC PT "6 Clicks" Mobility  Outcome Measure Help needed turning from your back to your side while in a flat bed without using bedrails?: None Help needed moving from lying on your back to sitting on the side of a flat bed without using bedrails?: None Help needed moving to and from a bed to a chair (including a wheelchair)?: A Little Help needed standing up from a chair using your arms (e.g., wheelchair or bedside chair)?: A Little Help needed to walk in hospital room?: A Little Help needed climbing 3-5 steps with a railing? : A Little 6 Click Score: 20    End of Session   Activity Tolerance: Patient tolerated treatment well;Patient limited by pain Patient left: in chair;with call bell/phone within reach Nurse Communication: Mobility status PT Visit Diagnosis: Other abnormalities of gait and  mobility (R26.89);Pain;History of falling (Z91.81) Pain - Right/Left: Left Pain - part of body: Ankle and joints of foot    Time: [id number] PT Time Calculation (min) (ACUTE ONLY): 20 min   Charges:   PT Evaluation $PT Eval Moderate Complexity: 1 Mod          West Carbo, PT, DPT   Acute Rehabilitation Department Pager #: [phone]  Sandra Cockayne 10/10/2020, 11:52 AM

## 2020-10-10 NOTE — Progress Notes (Addendum)
  Progress Note    10/10/2020 7:47 AM 1 Day Post-Op  Subjective:  Pain in abdomen.  L foot without rest pain since surgery   Vitals:   10/10/20 0700 10/10/20 0741  BP: 135/81   Pulse: 74   Resp: 12   Temp:  98.2 F (36.8 C)  SpO2: 98%    Physical Exam: Lungs:  non labored Incisions:  abd and groin incisions c/d/i Extremities:  palpable R PT; brisk L DP by doppler Abdomen:  soft, tender, ND Neurologic: A&O  CBC    Component Value Date/Time   WBC 9.6 10/10/2020 0316   RBC 3.55 (L) 10/10/2020 0316   HGB 10.4 (L) 10/10/2020 0316   HGB 11.7 (L) 12/12/2019 1006   HCT 32.1 (L) 10/10/2020 0316   HCT 36.2 (L) 12/12/2019 1006   PLT 248 10/10/2020 0316   PLT 249 12/12/2019 1006   MCV 90.4 10/10/2020 0316   MCV 90 12/12/2019 1006   MCH 29.3 10/10/2020 0316   MCHC 32.4 10/10/2020 0316   RDW 16.2 (H) 10/10/2020 0316   RDW 14.5 12/12/2019 1006   LYMPHSABS 1.9 10/06/2020 0537   LYMPHSABS 1.5 07/31/2019 1401   MONOABS 0.8 10/06/2020 0537   EOSABS 0.1 10/06/2020 0537   EOSABS 0.2 07/31/2019 1401   BASOSABS 0.0 10/06/2020 0537   BASOSABS 0.0 07/31/2019 1401    BMET    Component Value Date/Time   NA 130 (L) 10/10/2020 0316   NA 136 12/12/2019 1006   K 4.2 10/10/2020 0316   CL 99 10/10/2020 0316   CO2 23 10/10/2020 0316   GLUCOSE 125 (H) 10/10/2020 0316   BUN 9 10/10/2020 0316   BUN 18 12/12/2019 1006   CREATININE 0.89 10/10/2020 0316   CALCIUM 8.9 10/10/2020 0316   GFRNONAA >60 10/10/2020 0316   GFRAA 82 12/12/2019 1006    INR    Component Value Date/Time   INR 1.1 10/09/2020 1216     Intake/Output Summary (Last 24 hours) at 10/10/2020 0747 Last data filed at 10/10/2020 [id number] Gross per 24 hour  Intake 4155.9 ml  Output 2295 ml  Net 1860.9 ml     Assessment/Plan:  60 y.o. male is s/p ABF bypass with bilateral CFA endarterectomies 1 Day Post-Op   -BLE well perfused with palpable R PT and dopplerable L DP; known L SFA occlusion; L foot rest pain  resolved -lungs: non labored breathing; encouraged IS -heart: sinus rhythm, no chest pain; A line correlating with cuff pressure; ok to remove A line -renal: good UOP overnight about 30-40cc an hour; Cr stable, continue gentle IVF -Abd- soft, nondistended continue NPO until return of bowel function -Dispo: PT/OT eval today;  to 4E if ok with Dr. Stanford Breed   DVT prophylaxis: subq heparin on hold   Dagoberto Ligas, PA-C [facility] [phone] 10/10/2020 7:47 AM  VASCULAR STAFF ADDENDUM: I have independently interviewed and examined the patient. I agree with the above.  Looks good postop day 1 after aortobifemoral bypass with bilateral femoral endarterectomy. Reports pain in the abdomen.  No rest pain in left foot. Exam reassuring.  Appropriately tender abdomen.  Groins are soft.  Good Doppler flow in feet. Labs unremarkable. Good urine output overnight. Safe for transfer to 4 E. Remove central venous catheter, radial arterial line, Foley catheter. Mobilize as able.  [name], MD [facility] Phone Number: [phone] 10/10/2020 10:25 AM

## 2020-10-10 NOTE — Progress Notes (Signed)
Initial Nutrition Assessment  DOCUMENTATION CODES:   Severe malnutrition in context of chronic illness  INTERVENTION:   Ensure Enlive po TID, each supplement provides 350 kcal and 20 grams of protein, once diet advanced  Recommend Dysphagia 3 (easy to chew/mechanical soft) once diet advanced given pt is edentulous and dentures at home  If unable to advance diet in next 24-48 hours consider nutrition support given malnutrition diagnosis  NUTRITION DIAGNOSIS:   Severe Malnutrition related to chronic illness as evidenced by severe muscle depletion, severe fat depletion.  GOAL:   Patient will meet greater than or equal to 90% of their needs  MONITOR:   PO intake, Supplement acceptance, Labs, Weight trends  REASON FOR ASSESSMENT:   Consult Assessment of nutrition requirement/status  ASSESSMENT:   60 yo male admitted with critical lower limb ischemia with PAD requiring aorto-bifem bypass, left and right femoral endarterectomy. PMH includes HTN, DM, tobacco abuse  Pt reports no appetite currently. Currently NPO.  Pt is edentulous; wears dentures at home (fit well) but does not currently have with him. Pt will need softer, easy to chew foods once diet advanced  Pt reports 15 pound wt loss in 3 months secondary to decreased appetite and po intake post first foot surgery per pt. Pt reports UBW around 120 pounds; current wt 101 pounds. 15% wt loss. Per weight encounters over the past year however, pt has not weighed more than 100 pounds.   Pt reports he typically eats 3 meals per day in addition to snacks but 3 months ago was only eating around 2 meals per day.   +hiccups on visit today, +abdominal pain  Hx of DM, most recent HgbA1c 5.5  Labs: sodium 130 (L), CBGs 117-203 Meds: ss novolog, LR at 75 ml/hr, KCl   NUTRITION - FOCUSED PHYSICAL EXAM:  Flowsheet Row Most Recent Value  Orbital Region Moderate depletion  Upper Arm Region Severe depletion  Thoracic and Lumbar  Region Severe depletion  Buccal Region Severe depletion  Temple Region Severe depletion  Clavicle Bone Region Severe depletion  Clavicle and Acromion Bone Region Severe depletion  Scapular Bone Region Severe depletion  Dorsal Hand Moderate depletion  Patellar Region Mild depletion  Anterior Thigh Region Mild depletion  Posterior Calf Region No depletion  Edema (RD Assessment) None       Diet Order:   Diet Order             Diet NPO time specified Except for: Sips with Meds, Ice Chips  Diet effective now                   EDUCATION NEEDS:   Not appropriate for education at this time  Skin:  Skin Assessment: Skin Integrity Issues: Skin Integrity Issues:: Incisions, Other (Comment) Incisions: abdomen, groin Other: amputation of L. toe  Last BM:  9/12  Height:   Ht Readings from Last 1 Encounters:  10/10/20 '5\' 2"'$  (1.575 m)    Weight:   Wt Readings from Last 1 Encounters:  10/05/20 46 kg    BMI:  Body mass index is 18.55 kg/m.  Estimated Nutritional Needs:   Kcal:  1700-1900 kcals  Protein:  85-95 g  Fluid:  >/= 1.7 L   Kerman Passey MS, RDN, LDN, CNSC Registered Dietitian III Clinical Nutrition RD Pager and On-Call Pager Number Located in [place]

## 2020-10-11 DIAGNOSIS — I70229 Atherosclerosis of native arteries of extremities with rest pain, unspecified extremity: Secondary | ICD-10-CM | POA: Diagnosis not present

## 2020-10-11 DIAGNOSIS — I10 Essential (primary) hypertension: Secondary | ICD-10-CM | POA: Diagnosis not present

## 2020-10-11 DIAGNOSIS — E119 Type 2 diabetes mellitus without complications: Secondary | ICD-10-CM | POA: Diagnosis not present

## 2020-10-11 DIAGNOSIS — F17210 Nicotine dependence, cigarettes, uncomplicated: Secondary | ICD-10-CM | POA: Diagnosis not present

## 2020-10-11 LAB — BASIC METABOLIC PANEL
Anion gap: 7 (ref 5–15)
BUN: 8 mg/dL (ref 8–23)
CO2: 25 mmol/L (ref 22–32)
Calcium: 9 mg/dL (ref 8.9–10.3)
Chloride: 98 mmol/L (ref 98–111)
Creatinine, Ser: 0.82 mg/dL (ref 0.61–1.24)
GFR, Estimated: >60 mL/min (ref >60)
Glucose, Bld: 116 mg/dL — ABNORMAL HIGH (ref 70–99)
Potassium: 4.2 mmol/L (ref 3.5–5.1)
Sodium: 130 mmol/L — ABNORMAL LOW (ref 135–145)

## 2020-10-11 LAB — CBC
HCT: 27.8 % — ABNORMAL LOW (ref 39.0–52.0)
Hemoglobin: 9.1 g/dL — ABNORMAL LOW (ref 13.0–17.0)
MCH: 29.3 pg (ref 26.0–34.0)
MCHC: 32.7 g/dL (ref 30.0–36.0)
MCV: 89.4 fL (ref 80.0–100.0)
Platelets: 229 10*3/uL (ref 150–400)
RBC: 3.11 MIL/uL — ABNORMAL LOW (ref 4.22–5.81)
RDW: 16.4 % — ABNORMAL HIGH (ref 11.5–15.5)
WBC: 8.1 10*3/uL (ref 4.0–10.5)
nRBC: 0 % (ref 0.0–0.2)

## 2020-10-11 LAB — CULTURE, BLOOD (ROUTINE X 2)
Culture: NO GROWTH
Special Requests: ADEQUATE

## 2020-10-11 LAB — GLUCOSE, CAPILLARY
Glucose-Capillary: 140 mg/dL — ABNORMAL HIGH (ref 70–99)
Glucose-Capillary: 106 mg/dL — ABNORMAL HIGH (ref 70–99)
Glucose-Capillary: 94 mg/dL (ref 70–99)
Glucose-Capillary: 90 mg/dL (ref 70–99)
Glucose-Capillary: 123 mg/dL — ABNORMAL HIGH (ref 70–99)
Glucose-Capillary: 99 mg/dL (ref 70–99)

## 2020-10-11 MED ORDER — BISACODYL 5 MG PO TBEC
10.0000 mg | DELAYED_RELEASE_TABLET | Freq: Every day | ORAL | Status: DC
  Administered 2020-10-11: 10 mg via ORAL
  Filled 2020-10-11 (×2): qty 2

## 2020-10-11 NOTE — Progress Notes (Signed)
Occupational Therapy Treatment Patient Details Name: [name]. MRN: [id number] DOB: December 17, 1960 Today's Date: 10/11/2020   History of present illness The pt is a 60 yo male presenting 9/11 with c/o LLE pain following recent L pinky toe amputation on 5/20. Upon work-up pt LLE found to be cold to the touch with no palpable pulses. Pt s/p ultrasound-guided cannulation of R CFA on 9/12, now s/p aorto-bi-femoral bypass with bilateral endarterectomies on 9/15. PMH includes: DM II, PAD, R SFA arthrectomy and balloon angioplasty 6/21, stenting of L SFA and L CIA 5/22, HLD, HTN, current tobacco and marijuana use.   OT comments  Patient politely declining OOB and mobility this date.  Patient declining based on perceived pain.  Was willing to dangle edge of bed after much encouragement.  Light grooming task in unsupported sitting.  OT to continue efforts, but he should progress quickly as pain subsides.     Recommendations for follow up therapy are one component of a multi-disciplinary discharge planning process, led by the attending physician.  Recommendations may be updated based on patient status, additional functional criteria and insurance authorization.    Follow Up Recommendations  No OT follow up    Equipment Recommendations  None recommended by OT    Recommendations for Other Services      Precautions / Restrictions Precautions Precautions: Fall Restrictions Weight Bearing Restrictions: No       Mobility Bed Mobility Overal bed mobility: Needs Assistance Bed Mobility: Supine to Sit;Sit to Supine     Supine to sit: Min assist Sit to supine: Min assist   General bed mobility comments: assist with LE's up onto the bed, assist to elevate trunk    Transfers                 General transfer comment: declined OOB    Balance Overall balance assessment: Needs assistance Sitting-balance support: Bilateral upper extremity supported Sitting balance-Leahy Scale:  Fair                                     ADL either performed or assessed with clinical judgement   ADL       Grooming: Wash/dry hands;Wash/dry face;Set up;Sitting                                                                                                                           Pertinent Vitals/ Pain       Faces Pain Scale: Hurts whole lot Pain Location: mostly abdomen/incision, LLE sore especially around R pinky toe amputation Pain Descriptors / Indicators: Discomfort;Sore Pain Intervention(s): Limited activity within patient's tolerance  Frequency  Min 2X/week        Progress Toward Goals  OT Goals(current goals can now be found in the care plan section)     Acute Rehab OT Goals Patient Stated Goal: rest today OT Goal Formulation: With patient Time For Goal Achievement: 10/24/20 Potential to Achieve Goals: [place] Discharge plan remains appropriate    Co-evaluation                 AM-PAC OT "6 Clicks" Daily Activity     Outcome Measure   Help from another person eating meals?: None Help from another person taking care of personal grooming?: A Little Help from another person toileting, which includes using toliet, bedpan, or urinal?: A Little Help from another person bathing (including washing, rinsing, drying)?: A Little Help from another person to put on and taking off regular upper body clothing?: None Help from another person to put on and taking off regular lower body clothing?: A Little 6 Click Score: 20    End of Session    OT Visit Diagnosis: Pain;Unsteadiness on feet (R26.81);Other abnormalities of gait and mobility (R26.89)   Activity Tolerance Patient limited by pain   Patient Left in bed;with call bell/phone within reach   Nurse Communication          Time: [id number] OT  Time Calculation (min): 12 min  Charges: OT General Charges $OT Visit: 1 Visit OT Treatments $Self Care/Home Management : 8-22 mins  10/11/2020  RP, OTR/L  Acute Rehabilitation Services  Office:  [phone]   Metta Clines 10/11/2020, 2:39 PM

## 2020-10-11 NOTE — Progress Notes (Signed)
23m dilaudid wasted from PCA syringe with LNaoma Diener RN.

## 2020-10-11 NOTE — Progress Notes (Addendum)
PROGRESS NOTE    [name].   [id number]  DOB: Jul 13, 1960  DOA: 10/05/2020 PCP: Ruben Mile, NP   Brief Narrative:  [name]. 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 The patient presented to the ED with severe left leg pain which he said was progressive over the past 2 weeks.  CTA of aorta with iliofemoral runoff revealed a complete occlusion of the stent in the left superficial femoral artery and nonopacification of the left posterior tibial artery.  Vascular surgery consult was requested.   Subjective: He states his left foot is partly numb and pinky toe is painful. Abdominal incisions are mild to moderately painful. No BMs. No nausea.     Assessment & Plan:   Principal Problem:   Critical lower limb ischemia, peripheral artery disease Mild lactic acidosis - Lactic acid level noted to be 2.2 on 10/05/2020 -He has been taking Lipitor aspirin and Plavix at home - Continue heparin infusion, aspirin and Lipitor-Plavix is on hold - Continue pain control -Cardiology clearance requested by vascular surgery-the patient underwent a nuclear stress test which is a low risk study-2D echo shows a normal LV function-- - Risk factor modifications discussed with patient - 9/15> s/p aroto-bi-femoral bypass, left and right femoral endarterectomy  - incisions appear clean-   - Dilaudid infusion per surgical team - temps noted to be ~ 99 - moving to progressive floor this AM  Active Problems:  Anemia - Hb steadily dropping - check anemia panel in AM - no stools in the past 3 days - check hemoccult with next BM  Hyponatremia - follow- likely due to HCTZ which is on hold since 9/14 - on LR per general  surgery  Hypertension  - BP has been low to normal -holding lisinopril/HCTZ in the perioperative period -continue as needed hydralazine  Hypomagnesemia - has been replaced     Type 2 diabetes mellitus with peripheral neuropathy - he is no on medications at home -Continue sliding scale NovoLog and gabapentin - Hemoglobin A1C    Component Value Date/Time   HGBA1C 5.5 10/06/2020 0537      Nicotine dependence, cigarettes, uncomplicated -Has been counseled on quitting smoking especially in light of his current extensive vascular issues     Time spent in minutes: 35 DVT prophylaxis: heparin Code Status: Full code Family Communication:  Level of Care: Level of care: Progressive Disposition Plan:  Status is: Inpatient  Remains inpatient appropriate because:Inpatient level of care appropriate due to severity of illness  Dispo: The patient is from: Home              Anticipated d/c is to: Home              Patient currently is not medically stable to d/c.   Difficult to place patient No  Consultants:  Vascular surgery Cardiology  Procedures:  Nuclear stress test Aortogram with bilateral lower extremity runoff Antimicrobials:  Anti-infectives (From admission, onward)    Start     Dose/Rate Route Frequency Ordered Stop   10/09/20 1345  ceFAZolin (ANCEF) IVPB 2g/100 mL premix        2 g 200 mL/hr over 30 Minutes Intravenous Every 8 hours 10/09/20 1333 10/09/20 2333   10/09/20 0600  ceFAZolin (ANCEF) IVPB 1 g/50 mL  premix        1 g 100 mL/hr over 30 Minutes Intravenous To Short Stay 10/08/20 0825 10/09/20 0815        Objective: Vitals:   10/11/20 0808 10/11/20 0900 10/11/20 0954 10/11/20 1000  BP:  114/66    Pulse:  82 88 82  Resp: '15 14 14 17  '$ Temp:      TempSrc:      SpO2: 100% 100% 100% 99%  Weight:      Height:        Intake/Output Summary (Last 24 hours) at 10/11/2020 1058 Last data filed at 10/11/2020 1000 Gross per 24 hour  Intake 334.72 ml   Output 1600 ml  Net -1265.28 ml    Filed Weights   10/05/20 1641  Weight: 46 kg    Examination: General exam: Appears comfortable  HEENT: PERRLA, oral mucosa moist, no sclera icterus or thrush Respiratory system: Clear to auscultation. Respiratory effort normal. Cardiovascular system: S1 & S2 heard, regular rate and rhythm Gastrointestinal system: Abdomen soft, non-tender, nondistended. Normal bowel sounds - abdominal incisions noted to be clean  Central nervous system: Alert and oriented. No focal neurological deficits. Extremities: No cyanosis, clubbing or edema- moderate tenderness in left 5th toe Skin: No rashes or ulcers Psychiatry:  Mood & affect appropriate.      Data Reviewed: I have personally reviewed following labs and imaging studies  CBC: Recent Labs  Lab 10/05/20 1712 10/06/20 0537 10/07/20 0143 10/08/20 0213 10/09/20 0226 10/09/20 1020 10/09/20 1216 10/10/20 0316 10/11/20 0123  WBC 5.1 4.9   < > 5.1 5.5  --  16.5* 9.6 8.1  NEUTROABS 2.7 2.0  --   --   --   --   --   --   --   HGB 15.1 12.8*   < > 13.4 13.6 10.5* 11.0* 10.4* 9.1*  HCT 46.1 39.8   < > 42.1 41.6 31.0* 34.4* 32.1* 27.8*  MCV 90.0 89.6   < > 90.7 88.7  --  90.1 90.4 89.4  PLT 212 211   < > 234 255  --  227 248 229   < > = values in this interval not displayed.    Basic Metabolic Panel: Recent Labs  Lab 10/06/20 0537 10/07/20 0143 10/08/20 0213 10/09/20 0226 10/09/20 1020 10/09/20 1216 10/10/20 0316 10/11/20 0123  NA 135   < > 136 134* 136 131* 130* 130*  K 3.6   < > 5.0 3.7 3.1* 3.5 4.2 4.2  CL 103   < > 97* 100  --  98 99 98  CO2 23   < > 29 26  --  '24 23 25  '$ GLUCOSE 92   < > 81 110*  --  189* 125* 116*  BUN 19   < > 11 12  --  '9 9 8  '$ CREATININE 1.01   < > 1.07 1.13  --  0.91 0.89 0.82  CALCIUM 9.1   < > 10.0 9.5  --  8.5* 8.9 9.0  MG 2.1  --   --   --   --  1.5* 2.1  --    < > = values in this interval not displayed.    GFR: Estimated Creatinine Clearance: 61.6 mL/min  (by C-G formula based on SCr of 0.82 mg/dL). Liver Function Tests: Recent Labs  Lab 10/06/20 0537 10/10/20 0316  AST 21 18  ALT 20 13  ALKPHOS 68 50  BILITOT 0.7 0.5  PROT 6.7 6.1*  ALBUMIN 3.4* 3.5    Recent Labs  Lab 10/10/20 0316  AMYLASE 60    No results for input(s): AMMONIA in the last 168 hours. Coagulation Profile: Recent Labs  Lab 10/05/20 2007 10/09/20 1216  INR 1.1 1.1    Cardiac Enzymes: No results for input(s): CKTOTAL, CKMB, CKMBINDEX, TROPONINI in the last 168 hours. BNP (last 3 results) No results for input(s): PROBNP in the last 8760 hours. HbA1C: No results for input(s): HGBA1C in the last 72 hours.  CBG: Recent Labs  Lab 10/10/20 1546 10/10/20 2013 10/11/20 0127 10/11/20 0421 10/11/20 0752  GLUCAP 115* 123* 140* 106* 94    Lipid Profile: No results for input(s): CHOL, HDL, LDLCALC, TRIG, CHOLHDL, LDLDIRECT in the last 72 hours.  Thyroid Function Tests: No results for input(s): TSH, T4TOTAL, FREET4, T3FREE, THYROIDAB in the last 72 hours. Anemia Panel: No results for input(s): VITAMINB12, FOLATE, FERRITIN, TIBC, IRON, RETICCTPCT in the last 72 hours. Urine analysis:    Component Value Date/Time   COLORURINE STRAW (A) 05/10/2020 1722   APPEARANCEUR CLEAR 05/10/2020 1722   LABSPEC 1.008 05/10/2020 1722   PHURINE 8.0 05/10/2020 1722   GLUCOSEU NEGATIVE 05/10/2020 1722   HGBUR NEGATIVE 05/10/2020 1722   BILIRUBINUR NEGATIVE 05/10/2020 1722   KETONESUR 5 (A) 05/10/2020 1722   PROTEINUR NEGATIVE 05/10/2020 1722   NITRITE NEGATIVE 05/10/2020 1722   LEUKOCYTESUR NEGATIVE 05/10/2020 1722   Sepsis Labs: '@LABRCNTIP'$ (procalcitonin:4,lacticidven:4) ) Recent Results (from the past 240 hour(s))  Blood culture (routine x 2)     Status: None   Collection Time: 10/05/20  5:13 PM   Specimen: BLOOD  Result Value Ref Range Status   Specimen Description   Final    BLOOD LEFT ANTECUBITAL Performed at [facility], [place]  [address] [place], [place] [address]    Special Requests   Final    BOTTLES DRAWN AEROBIC AND ANAEROBIC Blood Culture adequate volume Performed at [facility] [address], [place], [place] [address]    Culture   Final    NO GROWTH 5 DAYS Performed at [facility], [place] [address] [place], [place] [address]    Report Status 10/10/2020 FINAL  Final  Blood culture (routine x 2)     Status: None   Collection Time: 10/05/20  8:01 PM   Specimen: BLOOD  Result Value Ref Range Status   Specimen Description   Final    BLOOD LEFT ANTECUBITAL Performed at [facility] [address], [place], [place] [address]    Special Requests   Final    BOTTLES DRAWN AEROBIC AND ANAEROBIC Blood Culture adequate volume Performed at [facility] [address], [place], [place] [address]    Culture   Final    NO GROWTH 5 DAYS Performed at [facility], [place] [address], [place], [place] [address]    Report Status 10/11/2020 FINAL  Final  Resp Panel by RT-PCR (Flu A&B, Covid) Nasopharyngeal Swab     Status: None   Collection Time: 10/05/20  8:04 PM   Specimen: Nasopharyngeal Swab; Nasopharyngeal(NP) swabs in vial transport medium  Result Value Ref Range Status   SARS Coronavirus 2 by RT PCR NEGATIVE NEGATIVE Final    Comment: (NOTE) SARS-CoV-2 target nucleic acids are NOT DETECTED.  The SARS-CoV-2 RNA is generally detectable in upper respiratory specimens during the acute phase of infection. The lowest concentration of SARS-CoV-2 viral copies this assay can detect is 138 copies/mL. A negative  result does not preclude SARS-Cov-2 infection and should not be used as the sole basis for treatment or other patient management decisions. A negative result may occur with  improper specimen collection/handling, submission of specimen other than nasopharyngeal swab, presence of viral mutation(s) within the areas targeted by this assay,  and inadequate number of viral copies(<138 copies/mL). A negative result must be combined with clinical observations, patient history, and epidemiological information. The expected result is Negative.  Fact Sheet for Patients:  EntrepreneurPulse.com.au  Fact Sheet for Healthcare Providers:  IncredibleEmployment.be  This test is no t yet approved or cleared by the [place] FDA and  has been authorized for detection and/or diagnosis of SARS-CoV-2 by FDA under an Emergency Use Authorization (EUA). This EUA will remain  in effect (meaning this test can be used) for the duration of the COVID-19 declaration under Section 564(b)(1) of the Act, 21 U.S.C.section 360bbb-3(b)(1), unless the authorization is terminated  or revoked sooner.       Influenza A by PCR NEGATIVE NEGATIVE Final   Influenza B by PCR NEGATIVE NEGATIVE Final    Comment: (NOTE) The Xpert Xpress SARS-CoV-2/FLU/RSV plus assay is intended as an aid in the diagnosis of influenza from Nasopharyngeal swab specimens and should not be used as a sole basis for treatment. Nasal washings and aspirates are unacceptable for Xpert Xpress SARS-CoV-2/FLU/RSV testing.  Fact Sheet for Patients: EntrepreneurPulse.com.au  Fact Sheet for Healthcare Providers: IncredibleEmployment.be  This test is not yet approved or cleared by the [place] FDA and has been authorized for detection and/or diagnosis of SARS-CoV-2 by FDA under an Emergency Use Authorization (EUA). This EUA will remain in effect (meaning this test can be used) for the duration of the COVID-19 declaration under Section 564(b)(1) of the Act, 21 U.S.C. section 360bbb-3(b)(1), unless the authorization is terminated or revoked.  Performed at [facility], [place] [address] [place], [place] [address]   Surgical pcr screen     Status: None   Collection Time: 10/08/20  9:05 PM    Specimen: Nasal Mucosa; Nasal Swab  Result Value Ref Range Status   MRSA, PCR NEGATIVE NEGATIVE Final   Staphylococcus aureus NEGATIVE NEGATIVE Final    Comment: (NOTE) The Xpert SA Assay (FDA approved for NASAL specimens in patients 38 years of age and older), is one component of a comprehensive surveillance program. It is not intended to diagnose infection nor to guide or monitor treatment. Performed at [facility], [place] [address] [place], [place] [address]          Radiology Studies: DG Chest Port 1 View  Result Date: 10/09/2020 CLINICAL DATA:  Status post vascular surgical bypass and placement central line. EXAM: PORTABLE CHEST 1 VIEW COMPARISON:  05/10/2020 FINDINGS: Right jugular central line present with the catheter tip located in the lower SVC. The heart size and mediastinal contours are within normal limits. There is no evidence of pulmonary edema, consolidation, pneumothorax or pleural fluid. The visualized skeletal structures are unremarkable. IMPRESSION: Central line tip in SVC.  No pneumothorax or other acute findings. Electronically Signed   By: Aletta Edouard M.D.   On: 10/09/2020 12:45      Scheduled Meds:  acetaminophen  650 mg Oral Q6H   bisacodyl  10 mg Oral Daily   Chlorhexidine Gluconate Cloth  6 each Topical Daily   docusate sodium  200 mg Oral BID   heparin  5,000 Units Subcutaneous Q8H   HYDROmorphone   Intravenous Q4H   insulin  aspart  0-9 Units Subcutaneous TID WC   pantoprazole  40 mg Oral Daily   Continuous Infusions:  lactated ringers 75 mL/hr at 10/11/20 0514     LOS: 6 days      Debbe Odea, MD [facility] Pager: [email or web address] 10/11/2020, 10:58 AM

## 2020-10-11 NOTE — Plan of Care (Signed)
?  Problem: Education: ?Goal: Knowledge of General Education information will improve ?Description: Including pain rating scale, medication(s)/side effects and non-pharmacologic comfort measures ?Outcome: Progressing ?  ?Problem: Clinical Measurements: ?Goal: Respiratory complications will improve ?Outcome: Progressing ?  ?Problem: Activity: ?Goal: Risk for activity intolerance will decrease ?Outcome: Progressing ?  ?Problem: Nutrition: ?Goal: Adequate nutrition will be maintained ?Outcome: Progressing ?  ?Problem: Coping: ?Goal: Level of anxiety will decrease ?Outcome: Progressing ?  ?Problem: Pain Managment: ?Goal: General experience of comfort will improve ?Outcome: Progressing ?  ?

## 2020-10-11 NOTE — Progress Notes (Signed)
  Progress Note    10/11/2020 11:43 AM 2 Days Post-Op  Subjective:   Patient doing well this morning.  Continues to complain of mild abdominal pain.  Bilateral lower extremities without rest pain.  Patient passing gas.  Hungry.   Vitals:   10/11/20 1000 10/11/20 1100  BP:  (!) 143/72  Pulse: 82 85  Resp: 17 13  Temp:    SpO2: 99% 98%   Physical Exam: Lungs:  non labored Incisions:  abd and groin incisions c/d/i Extremities:  palpable R PT; brisk L DP by doppler Abdomen:  soft, tender, ND Neurologic: A&O  CBC    Component Value Date/Time   WBC 8.1 10/11/2020 0123   RBC 3.11 (L) 10/11/2020 0123   HGB 9.1 (L) 10/11/2020 0123   HGB 11.7 (L) 12/12/2019 1006   HCT 27.8 (L) 10/11/2020 0123   HCT 36.2 (L) 12/12/2019 1006   PLT 229 10/11/2020 0123   PLT 249 12/12/2019 1006   MCV 89.4 10/11/2020 0123   MCV 90 12/12/2019 1006   MCH 29.3 10/11/2020 0123   MCHC 32.7 10/11/2020 0123   RDW 16.4 (H) 10/11/2020 0123   RDW 14.5 12/12/2019 1006   LYMPHSABS 1.9 10/06/2020 0537   LYMPHSABS 1.5 07/31/2019 1401   MONOABS 0.8 10/06/2020 0537   EOSABS 0.1 10/06/2020 0537   EOSABS 0.2 07/31/2019 1401   BASOSABS 0.0 10/06/2020 0537   BASOSABS 0.0 07/31/2019 1401    BMET    Component Value Date/Time   NA 130 (L) 10/11/2020 0123   NA 136 12/12/2019 1006   K 4.2 10/11/2020 0123   CL 98 10/11/2020 0123   CO2 25 10/11/2020 0123   GLUCOSE 116 (H) 10/11/2020 0123   BUN 8 10/11/2020 0123   BUN 18 12/12/2019 1006   CREATININE 0.82 10/11/2020 0123   CALCIUM 9.0 10/11/2020 0123   GFRNONAA >60 10/11/2020 0123   GFRAA 82 12/12/2019 1006    INR    Component Value Date/Time   INR 1.1 10/09/2020 1216     Intake/Output Summary (Last 24 hours) at 10/11/2020 1143 Last data filed at 10/11/2020 1000 Gross per 24 hour  Intake 334.72 ml  Output 1600 ml  Net -1265.28 ml      Assessment/Plan:  60 y.o. male is s/p ABF bypass with bilateral CFA endarterectomies 2 Days Post-Op   -BLE  well perfused with palpable R PT and dopplerable L DP; known L SFA occlusion; L foot rest pain resolved -Lungs: Non labored breathing; encouraged IS -Heart: Sinus rhythm, no chest pain -Renal: Good UOP, following hyponatremia, can turn off fluids -Abd- Soft, nondistended, can move the clear liquid diet now that the patient is passing flatus -Heme- DVT prophylaxis, continued on Plavix  -Noted continued decrease in the patient's H&H.  Please perform Hemoccult screen.  Dispo: Can move to 4 E.  Appreciate excellent care from ICU.  Broadus John MD [facility] [phone] 10/11/2020 11:43 AM

## 2020-10-11 NOTE — Progress Notes (Signed)
Physician notified of patient reporting that he is feeling "upside down" without dizziness

## 2020-10-12 DIAGNOSIS — I70229 Atherosclerosis of native arteries of extremities with rest pain, unspecified extremity: Secondary | ICD-10-CM | POA: Diagnosis not present

## 2020-10-12 DIAGNOSIS — I10 Essential (primary) hypertension: Secondary | ICD-10-CM | POA: Diagnosis not present

## 2020-10-12 DIAGNOSIS — F17210 Nicotine dependence, cigarettes, uncomplicated: Secondary | ICD-10-CM | POA: Diagnosis not present

## 2020-10-12 DIAGNOSIS — E119 Type 2 diabetes mellitus without complications: Secondary | ICD-10-CM | POA: Diagnosis not present

## 2020-10-12 LAB — GLUCOSE, CAPILLARY
Glucose-Capillary: 105 mg/dL — ABNORMAL HIGH (ref 70–99)
Glucose-Capillary: 118 mg/dL — ABNORMAL HIGH (ref 70–99)
Glucose-Capillary: 120 mg/dL — ABNORMAL HIGH (ref 70–99)
Glucose-Capillary: 159 mg/dL — ABNORMAL HIGH (ref 70–99)

## 2020-10-12 LAB — OCCULT BLOOD X 1 CARD TO LAB, STOOL: Fecal Occult Bld: POSITIVE — AB

## 2020-10-12 LAB — CBC
HCT: 27.6 % — ABNORMAL LOW (ref 39.0–52.0)
Hemoglobin: 9.2 g/dL — ABNORMAL LOW (ref 13.0–17.0)
MCH: 29.6 pg (ref 26.0–34.0)
MCHC: 33.3 g/dL (ref 30.0–36.0)
MCV: 88.7 fL (ref 80.0–100.0)
Platelets: 272 10*3/uL (ref 150–400)
RBC: 3.11 MIL/uL — ABNORMAL LOW (ref 4.22–5.81)
RDW: 16.2 % — ABNORMAL HIGH (ref 11.5–15.5)
WBC: 7.5 10*3/uL (ref 4.0–10.5)
nRBC: 0 % (ref 0.0–0.2)

## 2020-10-12 LAB — BASIC METABOLIC PANEL
Anion gap: 11 (ref 5–15)
BUN: 7 mg/dL — ABNORMAL LOW (ref 8–23)
CO2: 23 mmol/L (ref 22–32)
Calcium: 9 mg/dL (ref 8.9–10.3)
Chloride: 100 mmol/L (ref 98–111)
Creatinine, Ser: 0.84 mg/dL (ref 0.61–1.24)
GFR, Estimated: >60 mL/min (ref >60)
Glucose, Bld: 92 mg/dL (ref 70–99)
Potassium: 3.7 mmol/L (ref 3.5–5.1)
Sodium: 134 mmol/L — ABNORMAL LOW (ref 135–145)

## 2020-10-12 MED ORDER — PANTOPRAZOLE SODIUM 40 MG PO TBEC
40.0000 mg | DELAYED_RELEASE_TABLET | Freq: Two times a day (BID) | ORAL | Status: DC
  Administered 2020-10-12 – 2020-10-15 (×7): 40 mg via ORAL
  Filled 2020-10-12 (×7): qty 1

## 2020-10-12 NOTE — Progress Notes (Signed)
Mobility Specialist Progress Note    10/12/20 1703  Mobility  Activity Refused mobility   Pt found in bed. C/o 8.5/10 pain at surgical area and in L foot. RN notified.    Hildred Alamin Mobility Specialist  Mobility Specialist Phone: [phone]

## 2020-10-12 NOTE — Plan of Care (Signed)
?  Problem: Clinical Measurements: ?Goal: Will remain free from infection ?Outcome: Progressing ?Goal: Diagnostic test results will improve ?Outcome: Progressing ?Goal: Respiratory complications will improve ?Outcome: Progressing ?  ?

## 2020-10-12 NOTE — Progress Notes (Signed)
  Progress Note    10/12/2020 10:15 AM 3 Days Post-Op  Subjective:   Patient doing well this morning.  Normal bowel movement.  Hungry.   Vitals:   10/12/20 0356 10/12/20 0852  BP: (!) 147/75 (!) 148/71  Pulse: 85 95  Resp: 16 14  Temp: 98.4 F (36.9 C) 99.5 F (37.5 C)  SpO2: 99% 96%   Physical Exam: Lungs:  non labored Incisions:  abd and groin incisions c/d/i Extremities:  palpable R PT; brisk L DP by doppler Abdomen:  soft, tender, ND Neurologic: A&O  CBC    Component Value Date/Time   WBC 7.5 10/12/2020 0103   RBC 3.11 (L) 10/12/2020 0103   HGB 9.2 (L) 10/12/2020 0103   HGB 11.7 (L) 12/12/2019 1006   HCT 27.6 (L) 10/12/2020 0103   HCT 36.2 (L) 12/12/2019 1006   PLT 272 10/12/2020 0103   PLT 249 12/12/2019 1006   MCV 88.7 10/12/2020 0103   MCV 90 12/12/2019 1006   MCH 29.6 10/12/2020 0103   MCHC 33.3 10/12/2020 0103   RDW 16.2 (H) 10/12/2020 0103   RDW 14.5 12/12/2019 1006   LYMPHSABS 1.9 10/06/2020 0537   LYMPHSABS 1.5 07/31/2019 1401   MONOABS 0.8 10/06/2020 0537   EOSABS 0.1 10/06/2020 0537   EOSABS 0.2 07/31/2019 1401   BASOSABS 0.0 10/06/2020 0537   BASOSABS 0.0 07/31/2019 1401    BMET    Component Value Date/Time   NA 134 (L) 10/12/2020 0103   NA 136 12/12/2019 1006   K 3.7 10/12/2020 0103   CL 100 10/12/2020 0103   CO2 23 10/12/2020 0103   GLUCOSE 92 10/12/2020 0103   BUN 7 (L) 10/12/2020 0103   BUN 18 12/12/2019 1006   CREATININE 0.84 10/12/2020 0103   CALCIUM 9.0 10/12/2020 0103   GFRNONAA >60 10/12/2020 0103   GFRAA 82 12/12/2019 1006    INR    Component Value Date/Time   INR 1.1 10/09/2020 1216     Intake/Output Summary (Last 24 hours) at 10/12/2020 1015 Last data filed at 10/12/2020 0134 Gross per 24 hour  Intake 2580.93 ml  Output 1300 ml  Net 1280.93 ml      Assessment/Plan:  60 y.o. male is s/p ABF bypass with bilateral CFA endarterectomies 3 Days Post-Op   -BLE well perfused with palpable R PT and dopplerable L  DP; known L SFA occlusion; L foot rest pain resolved -Lungs: Non labored breathing; encouraged IS -Heart: Sinus rhythm, no chest pain -Renal: Good UOP, following hyponatremia, can turn off fluids -Abd- Soft, nondistended, can move to regular diet  -We will hold on Plavix for the time being due to recent drop in H&H.  -There was no blood in the stool this morning.  We will continue to trend. Would appreciate physical therapy follow-up tomorrow prior to possible early week discharge.  Appreciate excellent care from primary team and nursing.   Broadus John MD [facility] [phone] 10/12/2020 10:15 AM

## 2020-10-12 NOTE — Progress Notes (Signed)
Physical Therapy Treatment Patient Details Name: [name]. MRN: [id number] DOB: Apr 07, 1960 Today's Date: 10/12/2020   History of Present Illness The pt is a 60 yo male presenting 9/11 with c/o LLE pain following recent L pinky toe amputation on 5/20. Upon work-up pt LLE found to be cold to the touch with no palpable pulses. Pt s/p ultrasound-guided cannulation of R CFA on 9/12, now s/p aorto-bi-femoral bypass with bilateral endarterectomies on 9/15. PMH includes: DM II, PAD, R SFA arthrectomy and balloon angioplasty 6/21, stenting of L SFA and L CIA 5/22, HLD, HTN, current tobacco and marijuana use.    PT Comments    Pt is making good, steady progress with mobility, ambulating an increased distance of up to ~20 ft with a RW this date. However, he continues to be limited by his lower extremity pain. Educated pt on proper stair sequencing. Pt would benefit from stair training in future sessions. Will continue to follow acutely. Current recommendations remain appropriate.    Recommendations for follow up therapy are one component of a multi-disciplinary discharge planning process, led by the attending physician.  Recommendations may be updated based on patient status, additional functional criteria and insurance authorization.  Follow Up Recommendations  Home health PT;Supervision for mobility/OOB     Equipment Recommendations  Rolling walker with 5" wheels    Recommendations for Other Services       Precautions / Restrictions Precautions Precautions: Fall Restrictions Weight Bearing Restrictions: No     Mobility  Bed Mobility Overal bed mobility: Needs Assistance Bed Mobility: Supine to Sit;Sit to Supine     Supine to sit: Min assist;HOB elevated Sit to supine: Min guard   General bed mobility comments: Pt requesting assistance from PT to transition to sit up L EOB, cued pt to roll onto side to use UEs as able, minA to complete. Min guard and extra time to manage  legs with return to supine.    Transfers Overall transfer level: Needs assistance Equipment used: Rolling walker (2 wheeled) Transfers: Sit to/from [facility] Sit to Stand: Min guard Stand pivot transfers: Min guard       General transfer comment: Extra time and min guard assist for safety with transfers.  Ambulation/Gait Ambulation/Gait assistance: Min guard;Min assist Gait Distance (Feet): 20 Feet Assistive device: Rolling walker (2 wheeled) Gait Pattern/deviations: Step-to pattern;Decreased stride length;Antalgic;Decreased stance time - left;Decreased weight shift to left Gait velocity: decreased Gait velocity interpretation: <1.31 ft/sec, indicative of household ambulator General Gait Details: Pt initially standing with L leg held up off the ground, cued pt to place foot on ground and WBAT. Pt with slow, antalgic gait pattern, needing cues to not move the RW so far away from him when turning as he had x1 lateral LOB needing minA to recover when doing so.   Stairs         General stair comments: Educated pt on leading up with good, down with bad, but did not practice stairs today   Wheelchair Mobility    Modified Rankin (Stroke Patients Only)       Balance Overall balance assessment: Needs assistance Sitting-balance support: No upper extremity supported;Feet supported Sitting balance-Leahy Scale: Fair     Standing balance support: Bilateral upper extremity supported Standing balance-Leahy Scale: Poor Standing balance comment: reliant on B UE support                            Cognition Arousal/Alertness: Awake/alert  Behavior During Therapy: WFL for tasks assessed/performed Overall Cognitive Status: Within Functional Limits for tasks assessed                                        Exercises General Exercises - Lower Extremity Ankle Circles/Pumps: AROM;Both;10 reps;Seated Long Arc Quad: AROM;10 reps;Seated (with  eccentric control) Hip Flexion/Marching: AROM;Both;10 reps;Seated    General Comments General comments (skin integrity, edema, etc.): educated pt to elevate and perform ankle pumps      Pertinent Vitals/Pain Pain Assessment: 0-10 Pain Score: 8  Pain Location: Bil legs (more pain in L than R) Pain Descriptors / Indicators: Discomfort;Sore Pain Intervention(s): Limited activity within patient's tolerance;Monitored during session;Repositioned    Home Living                      Prior Function            PT Goals (current goals can now be found in the care plan section) Acute Rehab PT Goals Patient Stated Goal: return home, decrease pain, stop smoking PT Goal Formulation: With patient Time For Goal Achievement: 10/24/20 Potential to Achieve Goals: Good Progress towards PT goals: Progressing toward goals    Frequency    Min 3X/week      PT Plan Current plan remains appropriate    Co-evaluation              AM-PAC PT "6 Clicks" Mobility   Outcome Measure  Help needed turning from your back to your side while in a flat bed without using bedrails?: A Little Help needed moving from lying on your back to sitting on the side of a flat bed without using bedrails?: A Little Help needed moving to and from a bed to a chair (including a wheelchair)?: A Little Help needed standing up from a chair using your arms (e.g., wheelchair or bedside chair)?: A Little Help needed to walk in hospital room?: A Little Help needed climbing 3-5 steps with a railing? : A Little 6 Click Score: 18    End of Session   Activity Tolerance: Patient tolerated treatment well;Patient limited by pain Patient left: with call bell/phone within reach;in bed;with bed alarm set Nurse Communication: Mobility status;Other (comment) (bowel movement) PT Visit Diagnosis: Other abnormalities of gait and mobility (R26.89);Pain;History of falling (Z91.81);Unsteadiness on feet (R26.81);Difficulty in  walking, not elsewhere classified (R26.2) Pain - Right/Left: Left Pain - part of body: Ankle and joints of foot     Time: [id number] PT Time Calculation (min) (ACUTE ONLY): 18 min  Charges:  $Gait Training: 8-22 mins                     Moishe Spice, PT, DPT Acute Rehabilitation Services  Pager: [phone] Office: [place] 10/12/2020, 1:36 PM

## 2020-10-12 NOTE — Progress Notes (Signed)
PROGRESS NOTE    [name] [employer].   [id number]  DOB: [birth year]  DOA: 10/05/2020 PCP: Cipriano Mile, NP   Brief Narrative:  [name]. 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 The patient presented to the ED with severe left leg pain which he said was progressive over the past 2 weeks.  CTA of aorta with iliofemoral runoff revealed a complete occlusion of the stent in the left superficial femoral artery and nonopacification of the left posterior tibial artery.  Vascular surgery consult was requested.   Subjective: He has no new complaints.     Assessment & Plan:   Principal Problem:   Critical lower limb ischemia, peripheral artery disease Mild lactic acidosis - Lactic acid level noted to be 2.2 on 10/05/2020 -He has been taking Lipitor aspirin and Plavix at home - Continue heparin infusion, aspirin and Lipitor-Plavix is on hold - Continue pain control -Cardiology clearance requested by vascular surgery-the patient underwent a nuclear stress test which is a low risk study-2D echo shows a normal LV function-- - Risk factor modifications discussed with patient - 9/15> s/p aroto-bi-femoral bypass, left and right femoral endarterectomy  - incisions appear clean-   - progressed to heart healthy diet today  Active Problems:  Anemia - Hb has been steadily dropping but has been stable at 9 since yesterday - hemoccult + yesterday- stool is not bloody - have started BID Protonix - cont to follow Hgb daily and flow for gross blood in BMs  Hyponatremia - follow- likely due to HCTZ which is on hold since 9/14 - improved to 134 today  Hypertension  - BP has been low to normal -holding lisinopril/HCTZ in the  perioperative period -continue as needed hydralazine  Hypomagnesemia - has been replaced     Type 2 diabetes mellitus with peripheral neuropathy - he is no on medications at home -Continue sliding scale NovoLog and gabapentin - Hemoglobin A1C    Component Value Date/Time   HGBA1C 5.5 10/06/2020 0537      Nicotine dependence, cigarettes, uncomplicated -Has been counseled on quitting smoking especially in light of his current extensive vascular issues     Time spent in minutes: 35 DVT prophylaxis: heparin Code Status: Full code Family Communication:  Level of Care: Level of care: Progressive Disposition Plan:  Status is: Inpatient  Remains inpatient appropriate because:Inpatient level of care appropriate due to severity of illness  Dispo: The patient is from: Home              Anticipated d/c is to: Home              Patient currently is not medically stable to d/c.   Difficult to place patient No  Consultants:  Vascular surgery Cardiology  Procedures:  Nuclear stress test Aortogram with bilateral lower extremity runoff Antimicrobials:  Anti-infectives (From admission, onward)    Start     Dose/Rate Route Frequency Ordered Stop   10/09/20 1345  ceFAZolin (ANCEF) IVPB 2g/100 mL premix        2 g 200 mL/hr over 30 Minutes Intravenous Every 8 hours 10/09/20 1333 10/09/20 2333   10/09/20 0600  ceFAZolin (ANCEF) IVPB 1 g/50 mL premix        1 g 100 mL/hr over 30 Minutes Intravenous To  Short Stay 10/08/20 0825 10/09/20 0815        Objective: Vitals:   10/11/20 2037 10/11/20 2356 10/12/20 0356 10/12/20 0852  BP: (!) 145/86 (!) 157/87 (!) 147/75 (!) 148/71  Pulse: 85 83 85 95  Resp: '20 17 16 14  '$ Temp: 98.4 F (36.9 C) 98.7 F (37.1 C) 98.4 F (36.9 C) 99.5 F (37.5 C)  TempSrc: Oral Oral Oral Oral  SpO2: 100% 99% 99% 96%  Weight:      Height:        Intake/Output Summary (Last 24 hours) at 10/12/2020 1124 Last data filed at 10/12/2020 0134 Gross per 24  hour  Intake 2580.93 ml  Output 1300 ml  Net 1280.93 ml    Filed Weights   10/05/20 1641  Weight: 46 kg    Examination: General exam: Appears comfortable  HEENT: PERRLA, oral mucosa moist, no sclera icterus or thrush Respiratory system: Clear to auscultation. Respiratory effort normal. Cardiovascular system: S1 & S2 heard, regular rate and rhythm Gastrointestinal system: Abdomen soft, non-tender, nondistended. Normal bowel sounds  - abdominal incisions noted Central nervous system: Alert and oriented. No focal neurological deficits. Extremities: No cyanosis, clubbing or edema Skin: No rashes or ulcers Psychiatry:  Mood & affect appropriate.      Data Reviewed: I have personally reviewed following labs and imaging studies  CBC: Recent Labs  Lab 10/05/20 1712 10/06/20 0537 10/07/20 0143 10/09/20 0226 10/09/20 1020 10/09/20 1216 10/10/20 0316 10/11/20 0123 10/12/20 0103  WBC 5.1 4.9   < > 5.5  --  16.5* 9.6 8.1 7.5  NEUTROABS 2.7 2.0  --   --   --   --   --   --   --   HGB 15.1 12.8*   < > 13.6 10.5* 11.0* 10.4* 9.1* 9.2*  HCT 46.1 39.8   < > 41.6 31.0* 34.4* 32.1* 27.8* 27.6*  MCV 90.0 89.6   < > 88.7  --  90.1 90.4 89.4 88.7  PLT 212 211   < > 255  --  227 248 229 272   < > = values in this interval not displayed.    Basic Metabolic Panel: Recent Labs  Lab 10/06/20 0537 10/07/20 0143 10/09/20 0226 10/09/20 1020 10/09/20 1216 10/10/20 0316 10/11/20 0123 10/12/20 0103  NA 135   < > 134* 136 131* 130* 130* 134*  K 3.6   < > 3.7 3.1* 3.5 4.2 4.2 3.7  CL 103   < > 100  --  98 99 98 100  CO2 23   < > 26  --  '24 23 25 23  '$ GLUCOSE 92   < > 110*  --  189* 125* 116* 92  BUN 19   < > 12  --  '9 9 8 '$ 7*  CREATININE 1.01   < > 1.13  --  0.91 0.89 0.82 0.84  CALCIUM 9.1   < > 9.5  --  8.5* 8.9 9.0 9.0  MG 2.1  --   --   --  1.5* 2.1  --   --    < > = values in this interval not displayed.    GFR: Estimated Creatinine Clearance: 60.1 mL/min (by C-G formula based  on SCr of 0.84 mg/dL). Liver Function Tests: Recent Labs  Lab 10/06/20 0537 10/10/20 0316  AST 21 18  ALT 20 13  ALKPHOS 68 50  BILITOT 0.7 0.5  PROT 6.7 6.1*  ALBUMIN 3.4* 3.5    Recent Labs  Lab  10/10/20 0316  AMYLASE 60    No results for input(s): AMMONIA in the last 168 hours. Coagulation Profile: Recent Labs  Lab 10/05/20 2007 10/09/20 1216  INR 1.1 1.1    Cardiac Enzymes: No results for input(s): CKTOTAL, CKMB, CKMBINDEX, TROPONINI in the last 168 hours. BNP (last 3 results) No results for input(s): PROBNP in the last 8760 hours. HbA1C: No results for input(s): HGBA1C in the last 72 hours.  CBG: Recent Labs  Lab 10/11/20 0752 10/11/20 1135 10/11/20 1709 10/11/20 2203 10/12/20 0601  GLUCAP 94 90 123* 99 105*    Lipid Profile: No results for input(s): CHOL, HDL, LDLCALC, TRIG, CHOLHDL, LDLDIRECT in the last 72 hours.  Thyroid Function Tests: No results for input(s): TSH, T4TOTAL, FREET4, T3FREE, THYROIDAB in the last 72 hours. Anemia Panel: No results for input(s): VITAMINB12, FOLATE, FERRITIN, TIBC, IRON, RETICCTPCT in the last 72 hours. Urine analysis:    Component Value Date/Time   COLORURINE STRAW (A) 05/10/2020 1722   APPEARANCEUR CLEAR 05/10/2020 1722   LABSPEC 1.008 05/10/2020 1722   PHURINE 8.0 05/10/2020 1722   GLUCOSEU NEGATIVE 05/10/2020 1722   HGBUR NEGATIVE 05/10/2020 1722   BILIRUBINUR NEGATIVE 05/10/2020 1722   KETONESUR 5 (A) 05/10/2020 1722   PROTEINUR NEGATIVE 05/10/2020 1722   NITRITE NEGATIVE 05/10/2020 1722   LEUKOCYTESUR NEGATIVE 05/10/2020 1722   Sepsis Labs: '@LABRCNTIP'$ (procalcitonin:4,lacticidven:4) ) Recent Results (from the past 240 hour(s))  Blood culture (routine x 2)     Status: None   Collection Time: 10/05/20  5:13 PM   Specimen: BLOOD  Result Value Ref Range Status   Specimen Description   Final    BLOOD LEFT ANTECUBITAL Performed at [facility], [place] [address], [place], [place]  [address]    Special Requests   Final    BOTTLES DRAWN AEROBIC AND ANAEROBIC Blood Culture adequate volume Performed at [facility] [address], [place], [place] [address]    Culture   Final    NO GROWTH 5 DAYS Performed at [facility], [place] [address], [place], [place] [address]    Report Status 10/10/2020 FINAL  Final  Blood culture (routine x 2)     Status: None   Collection Time: 10/05/20  8:01 PM   Specimen: BLOOD  Result Value Ref Range Status   Specimen Description   Final    BLOOD LEFT ANTECUBITAL Performed at [facility] [address] [place], [place] [address]    Special Requests   Final    BOTTLES DRAWN AEROBIC AND ANAEROBIC Blood Culture adequate volume Performed at [facility] [address], [place], [place] [address]    Culture   Final    NO GROWTH 5 DAYS Performed at [facility], [place] [address] [place], [place] [address]    Report Status 10/11/2020 FINAL  Final  Resp Panel by RT-PCR (Flu A&B, Covid) Nasopharyngeal Swab     Status: None   Collection Time: 10/05/20  8:04 PM   Specimen: Nasopharyngeal Swab; Nasopharyngeal(NP) swabs in vial transport medium  Result Value Ref Range Status   SARS Coronavirus 2 by RT PCR NEGATIVE NEGATIVE Final    Comment: (NOTE) SARS-CoV-2 target nucleic acids are NOT DETECTED.  The SARS-CoV-2 RNA is generally detectable in upper respiratory specimens during the acute phase of infection. The lowest concentration of SARS-CoV-2 viral copies this assay can detect is 138 copies/mL. A negative result does not preclude SARS-Cov-2 infection and should not be  used as the sole basis for treatment or other patient management decisions. A negative result may occur with  improper specimen collection/handling, submission of specimen other than nasopharyngeal swab, presence of viral mutation(s) within the areas targeted by this assay, and inadequate number of  viral copies(<138 copies/mL). A negative result must be combined with clinical observations, patient history, and epidemiological information. The expected result is Negative.  Fact Sheet for Patients:  EntrepreneurPulse.com.au  Fact Sheet for Healthcare Providers:  IncredibleEmployment.be  This test is no t yet approved or cleared by the [place] FDA and  has been authorized for detection and/or diagnosis of SARS-CoV-2 by FDA under an Emergency Use Authorization (EUA). This EUA will remain  in effect (meaning this test can be used) for the duration of the COVID-19 declaration under Section 564(b)(1) of the Act, 21 U.S.C.section 360bbb-3(b)(1), unless the authorization is terminated  or revoked sooner.       Influenza A by PCR NEGATIVE NEGATIVE Final   Influenza B by PCR NEGATIVE NEGATIVE Final    Comment: (NOTE) The Xpert Xpress SARS-CoV-2/FLU/RSV plus assay is intended as an aid in the diagnosis of influenza from Nasopharyngeal swab specimens and should not be used as a sole basis for treatment. Nasal washings and aspirates are unacceptable for Xpert Xpress SARS-CoV-2/FLU/RSV testing.  Fact Sheet for Patients: EntrepreneurPulse.com.au  Fact Sheet for Healthcare Providers: IncredibleEmployment.be  This test is not yet approved or cleared by the [place] FDA and has been authorized for detection and/or diagnosis of SARS-CoV-2 by FDA under an Emergency Use Authorization (EUA). This EUA will remain in effect (meaning this test can be used) for the duration of the COVID-19 declaration under Section 564(b)(1) of the Act, 21 U.S.C. section 360bbb-3(b)(1), unless the authorization is terminated or revoked.  Performed at [facility], [place] [address], [place], [place] [address]   Surgical pcr screen     Status: None   Collection Time: 10/08/20  9:05 PM   Specimen: Nasal Mucosa;  Nasal Swab  Result Value Ref Range Status   MRSA, PCR NEGATIVE NEGATIVE Final   Staphylococcus aureus NEGATIVE NEGATIVE Final    Comment: (NOTE) The Xpert SA Assay (FDA approved for NASAL specimens in patients 44 years of age and older), is one component of a comprehensive surveillance program. It is not intended to diagnose infection nor to guide or monitor treatment. Performed at [facility], [place] [address], [place], [place] [address]          Radiology Studies: No results found.    Scheduled Meds:  acetaminophen  650 mg Oral Q6H   bisacodyl  10 mg Oral Daily   Chlorhexidine Gluconate Cloth  6 each Topical Daily   docusate sodium  200 mg Oral BID   heparin  5,000 Units Subcutaneous Q8H   insulin aspart  0-9 Units Subcutaneous TID WC   pantoprazole  40 mg Oral BID AC   Continuous Infusions:     LOS: 7 days      Debbe Odea, MD [facility] Pager: [email or web address] 10/12/2020, 11:24 AM

## 2020-10-13 DIAGNOSIS — F17210 Nicotine dependence, cigarettes, uncomplicated: Secondary | ICD-10-CM | POA: Diagnosis not present

## 2020-10-13 DIAGNOSIS — R195 Other fecal abnormalities: Secondary | ICD-10-CM

## 2020-10-13 DIAGNOSIS — I10 Essential (primary) hypertension: Secondary | ICD-10-CM | POA: Diagnosis not present

## 2020-10-13 DIAGNOSIS — I70229 Atherosclerosis of native arteries of extremities with rest pain, unspecified extremity: Secondary | ICD-10-CM | POA: Diagnosis not present

## 2020-10-13 LAB — BPAM RBC
Blood Product Expiration Date: 202210162359
Blood Product Expiration Date: 202210162359
Blood Product Expiration Date: 202210162359
Blood Product Expiration Date: 202210162359
ISSUE DATE / TIME: 202209150911
ISSUE DATE / TIME: 202209150911
Unit Type and Rh: 5100
Unit Type and Rh: 5100
Unit Type and Rh: 5100
Unit Type and Rh: 5100

## 2020-10-13 LAB — TYPE AND SCREEN
ABO/RH(D): O POS
Antibody Screen: NEGATIVE
Unit division: 0
Unit division: 0
Unit division: 0
Unit division: 0

## 2020-10-13 LAB — GLUCOSE, CAPILLARY
Glucose-Capillary: 96 mg/dL (ref 70–99)
Glucose-Capillary: 91 mg/dL (ref 70–99)
Glucose-Capillary: 124 mg/dL — ABNORMAL HIGH (ref 70–99)
Glucose-Capillary: 165 mg/dL — ABNORMAL HIGH (ref 70–99)

## 2020-10-13 LAB — CBC
HCT: 28.3 % — ABNORMAL LOW (ref 39.0–52.0)
Hemoglobin: 9.5 g/dL — ABNORMAL LOW (ref 13.0–17.0)
MCH: 29.9 pg (ref 26.0–34.0)
MCHC: 33.6 g/dL (ref 30.0–36.0)
MCV: 89 fL (ref 80.0–100.0)
Platelets: 341 10*3/uL (ref 150–400)
RBC: 3.18 MIL/uL — ABNORMAL LOW (ref 4.22–5.81)
RDW: 16.1 % — ABNORMAL HIGH (ref 11.5–15.5)
WBC: 5.9 10*3/uL (ref 4.0–10.5)
nRBC: 0 % (ref 0.0–0.2)

## 2020-10-13 LAB — BASIC METABOLIC PANEL
Anion gap: 12 (ref 5–15)
BUN: 10 mg/dL (ref 8–23)
CO2: 23 mmol/L (ref 22–32)
Calcium: 9 mg/dL (ref 8.9–10.3)
Chloride: 98 mmol/L (ref 98–111)
Creatinine, Ser: 0.85 mg/dL (ref 0.61–1.24)
GFR, Estimated: >60 mL/min (ref >60)
Glucose, Bld: 101 mg/dL — ABNORMAL HIGH (ref 70–99)
Potassium: 3.4 mmol/L — ABNORMAL LOW (ref 3.5–5.1)
Sodium: 133 mmol/L — ABNORMAL LOW (ref 135–145)

## 2020-10-13 MED ORDER — POTASSIUM CHLORIDE CRYS ER 20 MEQ PO TBCR: 40.0000 meq | EXTENDED_RELEASE_TABLET | ORAL | Status: DC

## 2020-10-13 MED ORDER — POTASSIUM CHLORIDE CRYS ER 20 MEQ PO TBCR
40.0000 meq | EXTENDED_RELEASE_TABLET | Freq: Once | ORAL | Status: AC
  Administered 2020-10-13: 40 meq via ORAL
  Filled 2020-10-13: qty 2

## 2020-10-13 NOTE — Plan of Care (Signed)
  Problem: Health Behavior/Discharge Planning: Goal: Ability to manage health-related needs will improve Outcome: Progressing   Problem: Clinical Measurements: Goal: Ability to maintain clinical measurements within normal limits will improve Outcome: Progressing Goal: Will remain free from infection Outcome: Progressing Goal: Respiratory complications will improve Outcome: Progressing Goal: Cardiovascular complication will be avoided Outcome: Progressing   

## 2020-10-13 NOTE — Progress Notes (Signed)
Occupational Therapy Treatment Patient Details Name: [name]. MRN: [id number] DOB: 01-01-1961 Today's Date: 10/13/2020   History of present illness The pt is a 60 yo male presenting 9/11 with c/o LLE pain following recent L pinky toe amputation on 5/20. Upon work-up pt LLE found to be cold to the touch with no palpable pulses. Pt s/p ultrasound-guided cannulation of R CFA on 9/12, now s/p aorto-bi-femoral bypass with bilateral endarterectomies on 9/15. PMH includes: DM II, PAD, R SFA arthrectomy and balloon angioplasty 6/21, stenting of L SFA and L CIA 5/22, HLD, HTN, current tobacco and marijuana use.   OT comments  Pt received on BSC. Pt able to complete the remainder of toileting task and transfer back to recliner with Setup assist. Pt continues to cite L LE pain as a limitation but with cues for problem solving, pt able to complete LB dressing tasks with Setup assist. Discussed use of BSC as shower chair and pt reports need for Rolling walker at home for mobility. DME recs updated accordingly.    Recommendations for follow up therapy are one component of a multi-disciplinary discharge planning process, led by the attending physician.  Recommendations may be updated based on patient status, additional functional criteria and insurance authorization.    Follow Up Recommendations  No OT follow up    Equipment Recommendations  3 in 1 bedside commode;Other (comment) (Rolling walker)    Recommendations for Other Services      Precautions / Restrictions Precautions Precautions: Fall Restrictions Weight Bearing Restrictions: No       Mobility Bed Mobility               General bed mobility comments: received on BSC    Transfers Overall transfer level: Needs assistance Equipment used: None Transfers: Sit to/from Stand;Stand Pivot Transfers Sit to Stand: Modified independent (Device/Increase time) Stand pivot transfers: Supervision       General transfer  comment: Supervision at most for pivot back to chair from [facility] without AD.    Balance Overall balance assessment: Needs assistance Sitting-balance support: No upper extremity supported;Feet supported Sitting balance-Leahy Scale: Good     Standing balance support: Bilateral upper extremity supported Standing balance-Leahy Scale: Poor Standing balance comment: reliant on at least one UE support                           ADL either performed or assessed with clinical judgement   ADL Overall ADL's : Needs assistance/impaired                     Lower Body Dressing: Set up;Sitting/lateral leans Lower Body Dressing Details (indicate cue type and reason): able to don R sock without issue, requesting assist for L sock. Educated on trial of crossing LEs to reach (unable to fully complete due to pain. Educated to trial how he donned R sock (pulling LE up to self) with pt able to return demo without assist Toilet Transfer: Set up;Stand-pivot;BSC Toilet Transfer Details (indicate cue type and reason): received on BSC, able to transfer back to recliner without AD (holding to recliner armrest) and without LOB Toileting- Clothing Manipulation and Hygiene: Set up;Sit to/from stand;Sitting/lateral lean Toileting - Clothing Manipulation Details (indicate cue type and reason): Setup for hygiene using washcloths and toilet paper seated and standing, no physical assist needed       General ADL Comments: Pt self limiting participation citing L LE pain (though verbal expressions of  pain did not match facial expressions). able to pivot and perform ADLs without physical assist though benefits from cues for problem solving. Educated on use of BSC as shower chair if needed at [employer] Assessment?: No apparent visual deficits   Perception     Praxis      Cognition Arousal/Alertness: Awake/alert Behavior During Therapy: WFL for tasks assessed/performed Overall Cognitive  Status: Impaired/Different from baseline Area of Impairment: Problem solving                             Problem Solving: Slow processing;Difficulty sequencing;Requires verbal cues General Comments: verbal cues for problem solving and attempting tasks        Exercises     Shoulder Instructions       General Comments VSS on RA    Pertinent Vitals/ Pain       Pain Assessment: 0-10 Pain Score: 9  Pain Location: L LE Pain Descriptors / Indicators: Discomfort;Sore Pain Intervention(s): Monitored during session;Premedicated before session;Limited activity within patient's tolerance  Home Living                                          Prior Functioning/Environment              Frequency  Min 2X/week        Progress Toward Goals  OT Goals(current goals can now be found in the care plan section)  Progress towards OT goals: Progressing toward goals  Acute Rehab OT Goals Patient Stated Goal: return home, decrease pain, stop smoking OT Goal Formulation: With patient Time For Goal Achievement: 10/24/20 Potential to Achieve Goals: Fair ADL Goals Pt Will Perform Grooming: with modified independence;standing Pt Will Perform Lower Body Bathing: with modified independence;sit to/from stand Pt Will Perform Lower Body Dressing: with modified independence;sit to/from stand Pt Will Transfer to Toilet: with modified independence;ambulating Pt Will Perform Toileting - Clothing Manipulation and hygiene: with modified independence;sit to/from stand Pt Will Perform Tub/Shower Transfer: Shower transfer;with modified independence;ambulating;rolling walker  Plan Discharge plan remains appropriate;Equipment recommendations need to be updated    Co-evaluation                 AM-PAC OT "6 Clicks" Daily Activity     Outcome Measure   Help from another person eating meals?: None Help from another person taking care of personal grooming?: A  Little Help from another person toileting, which includes using toliet, bedpan, or urinal?: A Little Help from another person bathing (including washing, rinsing, drying)?: A Little Help from another person to put on and taking off regular upper body clothing?: None Help from another person to put on and taking off regular lower body clothing?: A Little 6 Click Score: 20    End of Session    OT Visit Diagnosis: Pain;Unsteadiness on feet (R26.81);Other abnormalities of gait and mobility (R26.89)   Activity Tolerance Patient tolerated treatment well;Patient limited by pain   Patient Left in bed;with call bell/phone within reach   Nurse Communication          Time: 1150-1200 OT Time Calculation (min): 10 min  Charges: OT General Charges $OT Visit: 1 Visit OT Treatments $Self Care/Home Management : 8-22 mins  Malachy Chamber, OTR/L Acute Rehab Services Office: [phone]   Layla Maw 10/13/2020, 12:20 PM

## 2020-10-13 NOTE — Progress Notes (Addendum)
   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 BLE well perfused. Tolerating diet.  Prior left SFA, pop and CIA stent placement 06/10/2020  Anemia: non-bloody heme + stool. On PPI, now BID dosing. Plavix on hold. Slow trend upward in Hgb last 72 hours. ? Resume Plavix + aspirin  DVT prophy: heparin [place]  Dispo: home with HHPT   SUBJECTIVE:   Reports left foot pain. Denies N,V.  PHYSICAL EXAM:   Vitals:   10/12/20 1552 10/12/20 2007 10/13/20 0036 10/13/20 0604  BP: (!) 141/74 132/73 137/79 (!) 141/78  Pulse: 91 88 87 82  Resp: '18 16 18 15  '$ Temp: 99.4 F (37.4 C) 98.7 F (37.1 C) 99.1 F (37.3 C) 99.5 F (37.5 C)  TempSrc: Oral Oral Oral Oral  SpO2: 97% 97% 97% 98%  Weight:      Height:       General appearance: Awake, alert in no apparent distress Cardiac: Heart rate and rhythm are regular Respirations: Nonlabored Incisions: Right/Left groin, midline incisions are all well approximated without bleeding or hematoma. Small amount of SS drainage from inferior aspect of midline incision. Extremities: Both feet are warm with intact sensation and motor function.  Ischemic changes noted and unchanged>>dry eschar dorsum right foot Pulse/Doppler exam:  Brisk right dorsalis pedis, posterior tibial, peroneal and brisk left DP and peroneal artery Doppler signals.  LABS:   Lab Results  Component Value Date   WBC 5.9 10/13/2020   HGB 9.5 (L) 10/13/2020   HCT 28.3 (L) 10/13/2020   MCV 89.0 10/13/2020   PLT 341 10/13/2020   Lab Results  Component Value Date   CREATININE 0.85 10/13/2020   Lab Results  Component Value Date   INR 1.1 10/09/2020   CBG (last 3)  Recent Labs    10/12/20 1550 10/12/20 2112 10/13/20 0631  GLUCAP 120* 159* 96    PROBLEM LIST:    Principal Problem:   Critical lower  limb ischemia (HCC) Active Problems:   Essential hypertension   Type 2 diabetes mellitus without complication, without long-term current use of insulin (HCC)   Nicotine dependence, cigarettes, uncomplicated   Mixed hyperlipidemia   CURRENT MEDS:    acetaminophen  650 mg Oral Q6H   bisacodyl  10 mg Oral Daily   Chlorhexidine Gluconate Cloth  6 each Topical Daily   docusate sodium  200 mg Oral BID   heparin  5,000 Units Subcutaneous Q8H   insulin aspart  0-9 Units Subcutaneous TID WC   pantoprazole  40 mg Oral BID AC    Barbie Banner, PA-C  Office: [phone] 10/13/2020   VASCULAR STAFF ADDENDUM: I have independently interviewed and examined the patient. I agree with the above.  Reports pain in the abdomen; L foot. L foot is well perfused by doppler. Labs reassuring. Mobilize as able. Home when pain better controlled.  [name], MD [facility] Phone Number: [phone] 10/13/2020 11:16 AM

## 2020-10-13 NOTE — Progress Notes (Signed)
OT Cancellation Note  Patient Details Name: [name]. MRN: [id number] DOB: 1960-08-19   Cancelled Treatment:    Reason Eval/Treat Not Completed: Patient declined, no reason specified Pt declined OT session, citing too much pain. Will follow-up again as schedule permits  Layla Maw 10/13/2020, 10:21 AM

## 2020-10-13 NOTE — Progress Notes (Signed)
PROGRESS NOTE    [name] [employer].   [id number]  DOB: [birth year]  DOA: 10/05/2020 PCP: Cipriano Mile, NP   Brief Narrative:  [name]. 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 The patient presented to the ED with severe left leg pain which he said was progressive over the past 2 weeks.  CTA of aorta with iliofemoral runoff revealed a complete occlusion of the stent in the left superficial femoral artery and nonopacification of the left posterior tibial artery.  Vascular surgery consult was requested.   Subjective: No complaints today    Assessment & Plan:   Principal Problem:   Critical lower limb ischemia, peripheral artery disease Mild lactic acidosis - Lactic acid level noted to be 2.2 on 10/05/2020 -He has been taking Lipitor aspirin and Plavix at home - Continue heparin infusion, aspirin and Lipitor-Plavix is on hold - Continue pain control -Cardiology clearance requested by vascular surgery-the patient underwent a nuclear stress test which is a low risk study-2D echo shows a normal LV function-- - Risk factor modifications discussed with patient - 9/15> s/p aroto-bi-femoral bypass, left and right femoral endarterectomy  - incisions appear clean-      Active Problems:  Anemia - Hb has been steadily dropping but has been stable at 9 since 9/17 - Hgb: 13.4> 10.4> 9.5 - hemoccult + on 9/18  > started BID Protonix -  he has had multiple BMs and none have had any gross blood - continuing to follow Hgb daily -   - daily hemoccults ordered- follow  Hyponatremia - follow- likely due to HCTZ which is on hold since 9/14 - improved to 134 today  Hypertension  - BP has been low to normal -holding  lisinopril/HCTZ in the perioperative period -continue as needed hydralazine  Hypomagnesemia - has been replaced     Type 2 diabetes mellitus with peripheral neuropathy - he is no on medications at home -Continue sliding scale NovoLog and gabapentin - Hemoglobin A1C    Component Value Date/Time   HGBA1C 5.5 10/06/2020 0537      Nicotine dependence, cigarettes, uncomplicated -Has been counseled on quitting smoking     Time spent in minutes: 35 DVT prophylaxis: heparin Code Status: Full code Family Communication:  Level of Care: Level of care: Progressive Disposition Plan:  Status is: Inpatient  Remains inpatient appropriate because:Inpatient level of care appropriate due to severity of illness  Dispo: The patient is from: Home              Anticipated d/c is to: Home              Patient currently is not medically stable to d/c.   Difficult to place patient No  Consultants:  Vascular surgery Cardiology  Procedures:  Nuclear stress test Aortogram with bilateral lower extremity runoff Antimicrobials:  Anti-infectives (From admission, onward)    Start     Dose/Rate Route Frequency Ordered Stop   10/09/20 1345  ceFAZolin (ANCEF) IVPB 2g/100 mL premix        2 g 200 mL/hr over 30 Minutes Intravenous Every 8 hours 10/09/20 1333 10/09/20 2333   10/09/20 0600  ceFAZolin (ANCEF) IVPB 1 g/50 mL premix        1 g 100 mL/hr over 30 Minutes Intravenous To  Short Stay 10/08/20 0825 10/09/20 0815        Objective: Vitals:   10/13/20 0036 10/13/20 0604 10/13/20 0904 10/13/20 1141  BP: 137/79 (!) 141/78 138/87 (!) 143/81  Pulse: 87 82 92 88  Resp: '18 15 15 16  '$ Temp: 99.1 F (37.3 C) 99.5 F (37.5 C) 98.9 F (37.2 C) 98.8 F (37.1 C)  TempSrc: Oral Oral Oral Oral  SpO2: 97% 98% 99% 100%  Weight:      Height:        Intake/Output Summary (Last 24 hours) at 10/13/2020 1239 Last data filed at 10/13/2020 1142 Gross per 24 hour  Intake 720 ml  Output 1150 ml  Net -430  ml    Filed Weights   10/05/20 1641  Weight: 46 kg    Examination: General exam: Appears comfortable  HEENT: PERRLA, oral mucosa moist, no sclera icterus or thrush Respiratory system: Clear to auscultation. Respiratory effort normal. Cardiovascular system: S1 & S2 heard, regular rate and rhythm Gastrointestinal system: Abdomen soft, non-tender, nondistended. Normal bowel sounds   Central nervous system: Alert and oriented. No focal neurological deficits. Extremities: No cyanosis, clubbing or edema Skin: No rashes or ulcers Psychiatry:  Mood & affect appropriate.      Data Reviewed: I have personally reviewed following labs and imaging studies  CBC: Recent Labs  Lab 10/09/20 1216 10/10/20 0316 10/11/20 0123 10/12/20 0103 10/13/20 0124  WBC 16.5* 9.6 8.1 7.5 5.9  HGB 11.0* 10.4* 9.1* 9.2* 9.5*  HCT 34.4* 32.1* 27.8* 27.6* 28.3*  MCV 90.1 90.4 89.4 88.7 89.0  PLT 227 248 229 272 [id number]    Basic Metabolic Panel: Recent Labs  Lab 10/09/20 1216 10/10/20 0316 10/11/20 0123 10/12/20 0103 10/13/20 0124  NA 131* 130* 130* 134* 133*  K 3.5 4.2 4.2 3.7 3.4*  CL 98 99 98 100 98  CO2 '24 23 25 23 23  '$ GLUCOSE 189* 125* 116* 92 101*  BUN '9 9 8 '$ 7* 10  CREATININE 0.91 0.89 0.82 0.84 0.85  CALCIUM 8.5* 8.9 9.0 9.0 9.0  MG 1.5* 2.1  --   --   --     GFR: Estimated Creatinine Clearance: 59.4 mL/min (by C-G formula based on SCr of 0.85 mg/dL). Liver Function Tests: Recent Labs  Lab 10/10/20 0316  AST 18  ALT 13  ALKPHOS 50  BILITOT 0.5  PROT 6.1*  ALBUMIN 3.5    Recent Labs  Lab 10/10/20 0316  AMYLASE 60    No results for input(s): AMMONIA in the last 168 hours. Coagulation Profile: Recent Labs  Lab 10/09/20 1216  INR 1.1    Cardiac Enzymes: No results for input(s): CKTOTAL, CKMB, CKMBINDEX, TROPONINI in the last 168 hours. BNP (last 3 results) No results for input(s): PROBNP in the last 8760 hours. HbA1C: No results for input(s): HGBA1C in the last 72  hours.  CBG: Recent Labs  Lab 10/12/20 1136 10/12/20 1550 10/12/20 2112 10/13/20 0631 10/13/20 1138  GLUCAP 118* 120* 159* 96 91    Lipid Profile: No results for input(s): CHOL, HDL, LDLCALC, TRIG, CHOLHDL, LDLDIRECT in the last 72 hours.  Thyroid Function Tests: No results for input(s): TSH, T4TOTAL, FREET4, T3FREE, THYROIDAB in the last 72 hours. Anemia Panel: No results for input(s): VITAMINB12, FOLATE, FERRITIN, TIBC, IRON, RETICCTPCT in the last 72 hours. Urine analysis:    Component Value Date/Time   COLORURINE STRAW (A) 05/10/2020 1722   APPEARANCEUR CLEAR 05/10/2020 1722   LABSPEC 1.008 05/10/2020 1722   PHURINE 8.0 05/10/2020  [place] 05/10/2020 1722   HGBUR NEGATIVE 05/10/2020 1722   BILIRUBINUR NEGATIVE 05/10/2020 1722   KETONESUR 5 (A) 05/10/2020 1722   PROTEINUR NEGATIVE 05/10/2020 1722   NITRITE NEGATIVE 05/10/2020 1722   LEUKOCYTESUR NEGATIVE 05/10/2020 1722   Sepsis Labs: '@LABRCNTIP'$ (procalcitonin:4,lacticidven:4) ) Recent Results (from the past 240 hour(s))  Blood culture (routine x 2)     Status: None   Collection Time: 10/05/20  5:13 PM   Specimen: BLOOD  Result Value Ref Range Status   Specimen Description   Final    BLOOD LEFT ANTECUBITAL Performed at [facility], [place] [address] [place], [place] [address]    Special Requests   Final    BOTTLES DRAWN AEROBIC AND ANAEROBIC Blood Culture adequate volume Performed at [facility] [address] [place], [place] [address]    Culture   Final    NO GROWTH 5 DAYS Performed at [facility], [place] [address], [place], [place] [address]    Report Status 10/10/2020 FINAL  Final  Blood culture (routine x 2)     Status: None   Collection Time: 10/05/20  8:01 PM   Specimen: BLOOD  Result Value Ref Range Status   Specimen Description   Final    BLOOD LEFT ANTECUBITAL Performed at [facility] [address],  [place], [place] [address]    Special Requests   Final    BOTTLES DRAWN AEROBIC AND ANAEROBIC Blood Culture adequate volume Performed at [facility] [address], [place], [place] [address]    Culture   Final    NO GROWTH 5 DAYS Performed at [facility], [place] [address], [place], [place] [address]    Report Status 10/11/2020 FINAL  Final  Resp Panel by RT-PCR (Flu A&B, Covid) Nasopharyngeal Swab     Status: None   Collection Time: 10/05/20  8:04 PM   Specimen: Nasopharyngeal Swab; Nasopharyngeal(NP) swabs in vial transport medium  Result Value Ref Range Status   SARS Coronavirus 2 by RT PCR NEGATIVE NEGATIVE Final    Comment: (NOTE) SARS-CoV-2 target nucleic acids are NOT DETECTED.  The SARS-CoV-2 RNA is generally detectable in upper respiratory specimens during the acute phase of infection. The lowest concentration of SARS-CoV-2 viral copies this assay can detect is 138 copies/mL. A negative result does not preclude SARS-Cov-2 infection and should not be used as the sole basis for treatment or other patient management decisions. A negative result may occur with  improper specimen collection/handling, submission of specimen other than nasopharyngeal swab, presence of viral mutation(s) within the areas targeted by this assay, and inadequate number of viral copies(<138 copies/mL). A negative result must be combined with clinical observations, patient history, and epidemiological information. The expected result is Negative.  Fact Sheet for Patients:  EntrepreneurPulse.com.au  Fact Sheet for Healthcare Providers:  IncredibleEmployment.be  This test is no t yet approved or cleared by the [place] FDA and  has been authorized for detection and/or diagnosis of SARS-CoV-2 by FDA under an Emergency Use Authorization (EUA). This EUA will remain  in effect (meaning this test can be used) for the duration of the COVID-19  declaration under Section 564(b)(1) of the Act, 21 U.S.C.section 360bbb-3(b)(1), unless the authorization is terminated  or revoked sooner.       Influenza A by PCR NEGATIVE NEGATIVE Final   Influenza B by PCR NEGATIVE NEGATIVE Final    Comment: (NOTE) The Xpert Xpress SARS-CoV-2/FLU/RSV  plus assay is intended as an aid in the diagnosis of influenza from Nasopharyngeal swab specimens and should not be used as a sole basis for treatment. Nasal washings and aspirates are unacceptable for Xpert Xpress SARS-CoV-2/FLU/RSV testing.  Fact Sheet for Patients: EntrepreneurPulse.com.au  Fact Sheet for Healthcare Providers: IncredibleEmployment.be  This test is not yet approved or cleared by the [place] FDA and has been authorized for detection and/or diagnosis of SARS-CoV-2 by FDA under an Emergency Use Authorization (EUA). This EUA will remain in effect (meaning this test can be used) for the duration of the COVID-19 declaration under Section 564(b)(1) of the Act, 21 U.S.C. section 360bbb-3(b)(1), unless the authorization is terminated or revoked.  Performed at [facility], [place] [address], [place], [place] [address]   Surgical pcr screen     Status: None   Collection Time: 10/08/20  9:05 PM   Specimen: Nasal Mucosa; Nasal Swab  Result Value Ref Range Status   MRSA, PCR NEGATIVE NEGATIVE Final   Staphylococcus aureus NEGATIVE NEGATIVE Final    Comment: (NOTE) The Xpert SA Assay (FDA approved for NASAL specimens in patients 59 years of age and older), is one component of a comprehensive surveillance program. It is not intended to diagnose infection nor to guide or monitor treatment. Performed at [facility], [place] [address], [place], [place] [address]          Radiology Studies: No results found.    Scheduled Meds:  acetaminophen  650 mg Oral Q6H   bisacodyl  10 mg Oral Daily   Chlorhexidine Gluconate  Cloth  6 each Topical Daily   docusate sodium  200 mg Oral BID   heparin  5,000 Units Subcutaneous Q8H   insulin aspart  0-9 Units Subcutaneous TID WC   pantoprazole  40 mg Oral BID AC   Continuous Infusions:     LOS: 8 days      Debbe Odea, MD [facility] Pager: [email or web address] 10/13/2020, 12:39 PM

## 2020-10-13 NOTE — Progress Notes (Signed)
Physical Therapy Treatment Patient Details Name: [name]. MRN: [id number] DOB: [birth year] Today's Date: 10/13/2020   History of Present Illness The pt is a 60 yo male presenting 9/11 with c/o LLE pain following recent L pinky toe amputation on 5/20. Upon work-up pt LLE found to be cold to the touch with no palpable pulses. Pt s/p ultrasound-guided cannulation of R CFA on 9/12, now s/p aorto-bi-femoral bypass with bilateral endarterectomies on 9/15. PMH includes: DM II, PAD, R SFA arthrectomy and balloon angioplasty 6/21, stenting of L SFA and L CIA 5/22, HLD, HTN, current tobacco and marijuana use.    PT Comments    The pt was seen for continued progression of OOB mobility and education in preparation for anticipated d/c home. The pt was able to progress ambulation distance, but continues to require cues for upright posture and reduced trunk flexion as well as cues for increased wt bearing on LLE as tolerated. The pt was also able to complete stair training 1step x3 and then 5 in a row with good stability and only minA. The pt will continue to benefit from skilled PT acutely top further progress mobility and establish exercise program to improve strength and ROM in BLE.      Recommendations for follow up therapy are one component of a multi-disciplinary discharge planning process, led by the attending physician.  Recommendations may be updated based on patient status, additional functional criteria and insurance authorization.  Follow Up Recommendations  Home health PT;Supervision for mobility/OOB     Equipment Recommendations  Rolling walker with 5" wheels    Recommendations for Other Services       Precautions / Restrictions Precautions Precautions: Fall Restrictions Weight Bearing Restrictions: No     Mobility  Bed Mobility Overal bed mobility: Needs Assistance Bed Mobility: Supine to Sit     Supine to sit: Min guard;HOB elevated     General bed mobility  comments: minG with increased time and use of bed rails but no assist given by therapist. cues to push on bed to scoot to EOB rather than pulling on RW    Transfers Overall transfer level: Needs assistance Equipment used: Rolling walker (2 wheeled) Transfers: Sit to/from Stand Sit to Stand: Min guard         General transfer comment: minG to power up, completed x4 in session with no instability or assist needed on initial stand. cues for hand placement on eccentric lower  Ambulation/Gait Ambulation/Gait assistance: Min guard Gait Distance (Feet): 40 Feet (+ 40 ft (stairs in the middle)) Assistive device: Rolling walker (2 wheeled) Gait Pattern/deviations: Step-to pattern;Decreased stride length;Antalgic;Decreased stance time - left;Decreased weight shift to left Gait velocity: decreased   General Gait Details: pt initially standing with LLE hovering, NWB. able to increase wt bearing with cues for wt shift and then was able to progress with step-to pattern and decreased wt shift to R. no overt LOB today   Stairs Stairs: Yes Stairs assistance: Min assist Stair Management: One rail Right;Step to pattern;Forwards Number of Stairs: 1 (x3, then 5) General stair comments: pt educated on leading up with good and down with bad, able to complete despite pain with good safety       Balance Overall balance assessment: Needs assistance Sitting-balance support: No upper extremity supported;Feet supported Sitting balance-Leahy Scale: Fair     Standing balance support: Bilateral upper extremity supported Standing balance-Leahy Scale: Poor Standing balance comment: reliant on B UE support  Cognition Arousal/Alertness: Awake/alert Behavior During Therapy: WFL for tasks assessed/performed Overall Cognitive Status: Within Functional Limits for tasks assessed                                        Exercises General Exercises - Lower  Extremity Ankle Circles/Pumps: AROM;Both;10 reps;Supine        Pertinent Vitals/Pain Pain Assessment: Faces Faces Pain Scale: Hurts even more Pain Location: Bil legs (more pain in L than R) Pain Descriptors / Indicators: Discomfort;Sore Pain Intervention(s): Limited activity within patient's tolerance;Monitored during session;Repositioned;Premedicated before session    PT Goals (current goals can now be found in the care plan section) Acute Rehab PT Goals Patient Stated Goal: return home, decrease pain, stop smoking PT Goal Formulation: With patient Time For Goal Achievement: 10/24/20 Potential to Achieve Goals: Good Progress towards PT goals: Progressing toward goals    Frequency    Min 3X/week      PT Plan Current plan remains appropriate       AM-PAC PT "6 Clicks" Mobility   Outcome Measure  Help needed turning from your back to your side while in a flat bed without using bedrails?: A Little Help needed moving from lying on your back to sitting on the side of a flat bed without using bedrails?: A Little Help needed moving to and from a bed to a chair (including a wheelchair)?: A Little Help needed standing up from a chair using your arms (e.g., wheelchair or bedside chair)?: A Little Help needed to walk in hospital room?: A Little Help needed climbing 3-5 steps with a railing? : A Little 6 Click Score: 18    End of Session Equipment Utilized During Treatment: Gait belt Activity Tolerance: Patient tolerated treatment well;Patient limited by pain Patient left: with call bell/phone within reach;in bed;with bed alarm set Nurse Communication: Mobility status PT Visit Diagnosis: Other abnormalities of gait and mobility (R26.89);Pain;History of falling (Z91.81);Unsteadiness on feet (R26.81);Difficulty in walking, not elsewhere classified (R26.2) Pain - Right/Left: Left Pain - part of body: Ankle and joints of foot     Time: [id number] PT Time Calculation (min) (ACUTE  ONLY): 28 min  Charges:  $Gait Training: 23-37 mins                     West Carbo, PT, DPT   Acute Rehabilitation Department Pager #: [phone]   Sandra Cockayne 10/13/2020, 8:54 AM

## 2020-10-14 DIAGNOSIS — M79604 Pain in right leg: Secondary | ICD-10-CM

## 2020-10-14 DIAGNOSIS — E782 Mixed hyperlipidemia: Secondary | ICD-10-CM | POA: Diagnosis not present

## 2020-10-14 DIAGNOSIS — I70229 Atherosclerosis of native arteries of extremities with rest pain, unspecified extremity: Secondary | ICD-10-CM | POA: Diagnosis not present

## 2020-10-14 DIAGNOSIS — I10 Essential (primary) hypertension: Secondary | ICD-10-CM | POA: Diagnosis not present

## 2020-10-14 DIAGNOSIS — Z9889 Other specified postprocedural states: Secondary | ICD-10-CM

## 2020-10-14 LAB — BASIC METABOLIC PANEL
Anion gap: 9 (ref 5–15)
BUN: 7 mg/dL — ABNORMAL LOW (ref 8–23)
CO2: 22 mmol/L (ref 22–32)
Calcium: 9.1 mg/dL (ref 8.9–10.3)
Chloride: 101 mmol/L (ref 98–111)
Creatinine, Ser: 0.83 mg/dL (ref 0.61–1.24)
GFR, Estimated: >60 mL/min (ref >60)
Glucose, Bld: 102 mg/dL — ABNORMAL HIGH (ref 70–99)
Potassium: 3.9 mmol/L (ref 3.5–5.1)
Sodium: 132 mmol/L — ABNORMAL LOW (ref 135–145)

## 2020-10-14 LAB — CBC
HCT: 27 % — ABNORMAL LOW (ref 39.0–52.0)
Hemoglobin: 9 g/dL — ABNORMAL LOW (ref 13.0–17.0)
MCH: 29.1 pg (ref 26.0–34.0)
MCHC: 33.3 g/dL (ref 30.0–36.0)
MCV: 87.4 fL (ref 80.0–100.0)
Platelets: 371 10*3/uL (ref 150–400)
RBC: 3.09 MIL/uL — ABNORMAL LOW (ref 4.22–5.81)
RDW: 15.9 % — ABNORMAL HIGH (ref 11.5–15.5)
WBC: 5.2 10*3/uL (ref 4.0–10.5)
nRBC: 0 % (ref 0.0–0.2)

## 2020-10-14 LAB — OCCULT BLOOD X 1 CARD TO LAB, STOOL: Fecal Occult Bld: NEGATIVE

## 2020-10-14 LAB — GLUCOSE, CAPILLARY
Glucose-Capillary: 145 mg/dL — ABNORMAL HIGH (ref 70–99)
Glucose-Capillary: 104 mg/dL — ABNORMAL HIGH (ref 70–99)
Glucose-Capillary: 113 mg/dL — ABNORMAL HIGH (ref 70–99)
Glucose-Capillary: 160 mg/dL — ABNORMAL HIGH (ref 70–99)

## 2020-10-14 MED ORDER — TRAMADOL HCL 50 MG PO TABS
50.0000 mg | ORAL_TABLET | Freq: Four times a day (QID) | ORAL | Status: DC | PRN
  Administered 2020-10-14: 50 mg via ORAL
  Administered 2020-10-14 – 2020-10-15 (×2): 100 mg via ORAL
  Filled 2020-10-14: qty 1
  Filled 2020-10-14 (×2): qty 2

## 2020-10-14 MED ORDER — ENSURE ENLIVE PO LIQD
237.0000 mL | Freq: Two times a day (BID) | ORAL | 12 refills | Status: DC
Start: 2020-10-14 — End: 2020-10-29

## 2020-10-14 MED ORDER — ADULT MULTIVITAMIN W/MINERALS CH: 1.0000 | ORAL_TABLET | Freq: Every day | ORAL | Status: AC

## 2020-10-14 MED ORDER — ENSURE ENLIVE PO LIQD
237.0000 mL | Freq: Two times a day (BID) | ORAL | Status: DC
  Administered 2020-10-14 – 2020-10-15 (×2): 237 mL via ORAL

## 2020-10-14 MED ORDER — ADULT MULTIVITAMIN W/MINERALS CH
1.0000 | ORAL_TABLET | Freq: Every day | ORAL | Status: DC
  Administered 2020-10-14 – 2020-10-15 (×2): 1 via ORAL
  Filled 2020-10-14 (×2): qty 1

## 2020-10-14 MED ORDER — PANTOPRAZOLE SODIUM 40 MG PO TBEC: 40.0000 mg | DELAYED_RELEASE_TABLET | Freq: Every day | ORAL | 0 refills | Status: DC

## 2020-10-14 MED ORDER — ACETAMINOPHEN 325 MG PO TABS: 650.0000 mg | ORAL_TABLET | Freq: Four times a day (QID) | ORAL | Status: DC | PRN

## 2020-10-14 NOTE — Progress Notes (Signed)
Mobility Specialist Progress Note   10/13/20 1645  Mobility  Activity Ambulated in hall  Level of Assistance Modified independent, requires aide device or extra time  Assistive Device Front wheel walker  Distance Ambulated (ft) 80 ft  Mobility Ambulated with assistance in hallway  Mobility Response Tolerated well  Mobility performed by Mobility specialist  $Mobility charge 1 Mobility   Pt received in bed c/o being in 8/10 pain, still agreeable to mobility session. Mod I for bed mobility and min A sit < > stand. Pt's gait was very slow with hesitancy on placement of L foot. Pt seemed to be bearing weight only on heels on L foot. Pt returned back to bed c/o being in 9/10 and requested meds. RN was notified and call bell was left by pt's side    Holland Falling Mobility Specialist Phone Number [phone]

## 2020-10-14 NOTE — Progress Notes (Addendum)
Nutrition Follow-up  DOCUMENTATION CODES:   Severe malnutrition in context of chronic illness  INTERVENTION:  - Provide chopped meats on all pt trays  - Ensure Enlive po BID, each supplement provides 350 kcal and 20 grams of protein  - Multivitamin w/ minerals daily  - Liberalize diet due to severe malnutrition         - Encourage good PO intake  NUTRITION DIAGNOSIS:   Severe Malnutrition related to chronic illness as evidenced by severe muscle depletion, severe fat depletion. - Ongoing  GOAL:   Patient will meet greater than or equal to 90% of their needs - Progressing  MONITOR:   PO intake, Supplement acceptance, Skin  REASON FOR ASSESSMENT:   Consult Assessment of nutrition requirement/status  ASSESSMENT:   60 yo male admitted with critical lower limb ischemia with PAD requiring aorto-bifem bypass, left and right femoral endarterectomy. PMH includes HTN, DM, tobacco abuse  9/15: Aorto-bi-femoral bypass, R&L femoral endarterectomy  Patient was resting in bed at the time of visit but was easily woken. Pt reports that things have not been going well due to nausea and stomach pain. Also reports that he is having difficulty chewing and swallowing his food. Per previous RD note, pt wears dentures but they are not currently with him.  Per EMR, pt intake includes: 9/14: Breakfast 100%, Lunch 75% 9/17: Breakfast 100%, Lunch 100%, Dinner 100% 9/18: Lunch 100% 9/19: Breakfast 100%  Per Vascular note, discussed advancing diet to D3 to better tolerate due to not having dentures.  Discussed D3 diet and chopped meat option, pt reports wants to have solid foods. Pt agreed to chopped meats to help him chew and swallow his foods. Also discussed Ensure with the pt; pt agreeable to trying.   MD notes awareness of issues chewing/swallowing and abdominal pain.   Medications reviewed and include: Colace, SSI 0-9 units w/ meals, Protonix Labs reviewed: 24 hr BG trends 91-165 mg/dL,  Hgb A1c 5.5%   Diet Order:   Diet Order             Diet Heart Room service appropriate? Yes; Fluid consistency: Thin  Diet effective now                   EDUCATION NEEDS:   Not appropriate for education at this time  Skin:  Skin Assessment: Skin Integrity Issues: Skin Integrity Issues:: Incisions, Other (Comment) Incisions: abdomen, groin Other: amputation of L. toe  Last BM:  9/20  Height:   Ht Readings from Last 1 Encounters:  10/10/20 '5\' 2"'$  (1.575 m)    Weight:   Wt Readings from Last 1 Encounters:  10/05/20 46 kg    Ideal Body Weight:  53.6 kg  BMI:  Body mass index is 18.55 kg/m.  Estimated Nutritional Needs:   Kcal:  1700-1900 kcals  Protein:  85-95 g  Fluid:  >/= 1.7 L    Christiona Siddique BS, PLDN Clinical Dietitian See [facility] for contact information.

## 2020-10-14 NOTE — Discharge Summary (Signed)
Physician Discharge Summary  [name]. KGM:[id number] DOB: Apr 24, 1960 DOA: 10/05/2020  PCP: Cipriano Mile, NP  Admit date: 10/05/2020 Discharge date: 10/14/2020  Admitted From: home  Disposition:  home   Recommendations for Outpatient Follow-up:  Follow Hgb   Home Health:  HHPT  Discharge Condition:  stable   CODE STATUS:  full code   Diet recommendation:  heart healthy, diabetic Consultations: Vascular surgery  Cardiology  Procedures/Studies: Nuclear stress test Aortogram with bilateral lower extremity runoff   Discharge Diagnoses:  Principal Problem:   Critical lower limb ischemia ([facility]) Active Problems:   Essential hypertension   Type 2 diabetes mellitus without complication, without long-term current use of insulin (HCC)   Nicotine dependence, cigarettes, uncomplicated   Mixed hyperlipidemia   Brief Summary: [name]. 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 The patient presented to the ED with severe left leg pain which he said was progressive over the past 2 weeks.  CTA of aorta with iliofemoral runoff revealed a complete occlusion of the stent in the left superficial femoral artery and nonopacification of the left posterior tibial artery.  Vascular surgery consult was requested.    Hospital Course:  Critical lower limb ischemia, peripheral artery disease Mild lactic acidosis - Lactic acid level noted to be 2.2 on 10/05/2020 -He has been taking Lipitor aspirin and Plavix at home - Continue heparin infusion, aspirin and Lipitor-Plavix is on hold - Continue pain control -Cardiology clearance requested by vascular surgery-the patient underwent a nuclear stress test which is a low risk study-2D  echo shows a normal LV function-- - Risk factor modifications discussed with patient - 9/15> s/p aroto-bi-femoral bypass, left and right femoral endarterectomy  - incisions appear clean-  vascular surgery recommended to resume ASA 81 mg today     Active Problems:   Anemia - Hb has been steadily dropping but has been stable at 9 since 9/17 - Hgb: 13.4> 10.4> 9.5> 9.0 - hemoccult + on 9/18  > started Protonix (no h/o GERD or PUD) -  he has had multiple BMs and none have had any gross blood  - daily hemoccults ordered- repeat hemoccult today is negative - cont daily Protonix for now      Hyponatremia - follow- likely due to HCTZ which was placed on hold during the peri-operative period   Hypertension  - BP has been low to normal but has steadily risen over the past couple of days -holding lisinopril/HCTZ in the perioperative period -continue as needed hydralazine   Hypomagnesemia - has been replaced      Type 2 diabetes mellitus with peripheral neuropathy - he is not on medications at home -Continue gabapentin - Hemoglobin A1C Labs (Brief)          Component Value Date/Time    HGBA1C 5.5 10/06/2020 0537          Nicotine dependence, cigarettes, uncomplicated -Has been counseled on quitting smoking         Discharge Exam: Vitals:   10/14/20 1113 10/14/20 1200  BP: (!) 155/85 (!) 152/91  Pulse: 91 78  Resp: 19 19  Temp: 98.8 F (37.1 C)   SpO2: 100% 96%   Vitals:   10/14/20 0353 10/14/20 0723 10/14/20 1113 10/14/20 1200  BP: (!) 144/113 (!) 146/90 Marland Kitchen)  155/85 (!) 152/91  Pulse: 86 83 91 78  Resp: (!) _0 Temp: 98.9 F (37.2 C) 99.1 F (37.3 C) 98.8 F (37.1 C)   TempSrc: Oral Oral Oral   SpO2: 98% 100% 100% 96%  Weight:      Height:        General: Pt is alert, awake, not in acute distress Cardiovascular: RRR, S1/S2 +, no rubs, no gallops Respiratory: CTA bilaterally, no wheezing, no rhonchi Abdominal: Soft, NT, ND, bowel sounds  + Extremities: no edema, no cyanosis   Discharge Instructions  Discharge Instructions     Diet - low sodium heart healthy   Complete by: As directed    Increase activity slowly   Complete by: As directed    No wound care   Complete by: As directed       Allergies as of 10/14/2020   No Known Allergies      Medication List     STOP taking these medications    clopidogrel 75 MG tablet Commonly known as: PLAVIX   oxyCODONE-acetaminophen 5-325 MG tablet Commonly known as: PERCOCET/ROXICET       TAKE these medications    acetaminophen 325 MG tablet Commonly known as: TYLENOL Take 2 tablets (650 mg total) by mouth every 6 (six) hours as needed.   aspirin 81 MG EC tablet Take 1 tablet (81 mg total) by mouth daily. Swallow whole. What changed: when to take this   atorvastatin 40 MG tablet Commonly known as: LIPITOR Take 40 mg by mouth daily.   Blood Pressure Monitor Kit Check blood pressure daily What changed:  how much to take how to take this when to take this additional instructions   docusate sodium 100 MG capsule Commonly known as: COLACE Take 1 capsule (100 mg total) by mouth 2 (two) times daily.   feeding supplement Liqd Take 237 mLs by mouth 2 (two) times daily between meals.   gabapentin 600 MG tablet Commonly known as: NEURONTIN Take 1 tablet (600 mg) in morning and afternoon. Take 2 tablets (1200 mg) at night What changed:  how much to take how to take this when to take this additional instructions   lisinopril-hydrochlorothiazide 20-25 MG tablet Commonly known as: ZESTORETIC Take 1 tablet by mouth daily.   multivitamin with minerals Tabs tablet Take 1 tablet by mouth daily.   nicotine 7 mg/24hr patch Commonly known as: NICODERM CQ - dosed in mg/24 hr Place 1 patch (7 mg total) onto the skin daily.   pantoprazole 40 MG tablet Commonly known as: PROTONIX Take 1 tablet (40 mg total) by mouth daily.   Santyl ointment Generic  drug: collagenase Apply 1 application topically 2 (two) times daily.   traMADol 50 MG tablet Commonly known as: ULTRAM Take 50 mg by mouth every 6 (six) hours as needed for moderate pain.        No Known Allergies    CT ANGIO AO+BIFEM W & OR WO CONTRAST  Result Date: 10/05/2020 CLINICAL DATA:  Concern for arterial embolism and lower extremity arterial occlusion. EXAM: CT ANGIOGRAPHY OF ABDOMINAL AORTA WITH ILIOFEMORAL RUNOFF TECHNIQUE: Multidetector CT imaging of the abdomen, pelvis and lower extremities was performed using the standard protocol during bolus administration of intravenous contrast. Multiplanar CT image reconstructions and MIPs were obtained to evaluate the vascular anatomy. CONTRAST:  144m OMNIPAQUE IOHEXOL 350 MG/ML SOLN COMPARISON:  None. FINDINGS: VASCULAR Aorta: Moderate atherosclerotic calcification of the abdominal aorta. No aneurysmal dilatation or dissection. No periaortic  fluid collection. 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 Celiac: The celiac artery and its major branches are patent. SMA: There is diffuse thickening of the wall of the proximal SMA with luminal narrowing. There is a focal area of high-grade luminal narrowing in the proximal SMA with greater than 50% luminal narrowing (37/4). The SMA remains patent. Renals: Both renal arteries are patent without evidence of aneurysm, dissection, vasculitis, fibromuscular dysplasia or significant stenosis. IMA: Patent without evidence of aneurysm, dissection, vasculitis or significant stenosis. RIGHT Lower Extremity Inflow: Advanced atherosclerotic calcification of the right common iliac artery with approximately 50% luminal narrowing. A stent is noted extending from the common iliac artery into the right external iliac artery. The stent is patent. There is atherosclerotic calcification of the distal right external iliac artery with luminal  narrowing. The external iliac artery however remains patent. There is atherosclerotic calcification of the right internal iliac artery with severe luminal narrowing and diminished flow. Outflow: Atherosclerotic calcification of the common femoral artery with luminal narrowing. The common femoral artery remains patent. The profundus femoris is patent. There is advanced atherosclerotic calcification of the superficial femoral artery with luminal narrowing. The SFA is patent. Popliteal artery is also patent. Runoff: There is minimal flow in the anterior tibial artery. There is reconstitution of flow in the dorsalis pedis artery. The posterior tibial artery and plantar arteries are patent. Very diminished flow in the fibular artery. LEFT Lower Extremity Inflow: There is advanced atherosclerotic calcification of the left common iliac artery with severe luminal narrowing, greater than 50%. A stent is noted in the distal left common iliac artery which appears patent. Advanced atherosclerotic calcification of the left internal and external iliac arteries. There is minimal flow in the left internal iliac artery. There is moderate narrowing of the left external iliac artery. The left external iliac artery remains patent. Outflow: There is atherosclerotic calcification and narrowing of the left common femoral artery. The common femoral arteries patent. The profundus femoris is patent. There is a stent in the left superficial femoral artery which is completely occluded. There is atherosclerotic plaque with wall thickening of the left popliteal artery and greater than 50% luminal narrowing. The popliteal artery remains patent but with diminished flow. Runoff: The anterior tibial artery and dorsalis pedis artery are patent. The fibular artery is patent to the mid calf. The posterior tibial artery is not opacified, likely occluded. There is reconstitution of minimal flow in the plantar artery. Veins: No obvious venous abnormality  within the limitations of this arterial phase study. Review of the MIP images confirms the above findings. NON-VASCULAR Lower chest: The visualized lung bases are clear. No intra-abdominal free air or free fluid. Hepatobiliary: The liver is unremarkable. No intrahepatic biliary ductal dilatation. The gallbladder is unremarkable. Pancreas: Pancreas is unremarkable as visualized. Spleen: Normal in size without focal abnormality. Adrenals/Urinary Tract: The adrenal glands are unremarkable. The kidneys, visualized ureters, and urinary bladder appear unremarkable. Stomach/Bowel: There is no bowel obstruction or active inflammation. The appendix is normal. Lymphatic: No adenopathy. Reproductive: Enlarged prostate gland. Other: None Musculoskeletal: Degenerative changes of the spine. No acute osseous pathology. IMPRESSION: 1. Advanced atherosclerotic calcification of the aorta and its major branches as described above. There is focal high-grade luminal narrowing of the proximal SMA. 2. Atherosclerotic calcification and narrowing of the iliac arteries. Patent bilateral common iliac artery stents. 3. Complete occlusion of the stent in the left superficial femoral artery. 4. Patent  right posterior tibial artery. Very diminished flow in the anterior tibial artery and fibular artery of the right lower extremity. 5. Patent left anterior tibial artery and dorsalis pedis artery. Non opacification of the left posterior tibial artery. 6. 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 Electronically Signed   By: Anner Crete M.D.   On: 10/05/2020 21:16   PERIPHERAL VASCULAR CATHETERIZATION  Result Date: 10/06/2020 Images from the original result were not included. Patient name: [name]. MRN: [id number] DOB: 21-May-1960 Sex: male 10/06/2020 Pre-operative Diagnosis: Chronic limb threatening ischemia left lower extremity with rest pain Post-operative  diagnosis:  Same Surgeon:  Eda Paschal. Donzetta Matters, MD Procedure Performed: 1.  Ultrasound-guided cannulation right common femoral artery 2.  Aortogram with bilateral lower extremity runoff 3.  Moderate sedation with fentanyl and Versed for 20 minutes Indications: 60 year old male with previous gangrenous changes of left fifth toe which has now been amputated following stenting of his left common external leg arteries in his left SFA.  He is now here with rest pain of his left lower extremity that has been present for 1 week.  He had CT angio which demonstrates severe disease of his left common and external neck arteries as well as his common femoral arteries bilaterally and occluded left SFA stenting.  He is indicated for angiography with possible intervention. Findings: His aorta is free of flow-limiting stenosis.  Bilateral common iliac arteries are stenosed the left side approximately 80%.  There is a stent crossing his hypogastric artery which does appear to have disease flow-limiting approximately 50%.  There is a stent in the right external leg artery with disease flow-limiting approximately 50%.  Bilateral common femoral arteries are diseased with at least 30% stenosis.  On the right side the SFA is quite diminutive he has runoff in line via the peroneal and posterior tibial arteries.  On the left side is common femoral artery again is disease there is an occluded SFA stent just after 1 cm after the takeoff.  Profunda femoris artery is patent.  He reconstitutes very diminutive popliteal artery on the left at the level of the knee with single-vessel peroneal artery runoff on the left side. Patient will be scheduled for aortobifemoral bypass for chronic limb threatening ischemia.  Procedure:  The patient was identified in the holding area and taken to room 8.  The patient was then placed supine on the table and prepped and draped in the usual sterile fashion.  A time out was called.  Ultrasound was used to evaluate  the right common femoral artery.  This was disease.  We cannulated with micropuncture needle followed by wire and sheath.  Images saved the permanent record.  We placed a Bentson wire followed by 5 [place] sheath.  We administered moderate sedation with fentanyl and Versed and his vital signs were monitored throughout the case.  Pigtail catheter was placed to the level of L1 aortogram performed.  We then pulled down to the bifurcation performed bilateral lower extremity runoff.  With the above findings we elected for surgical intervention.  Catheter was removed over wire.  Sheath will be pulled in postoperative holding.  He tolerated procedure without any complication. Contrast: 97cc Ruben C. Donzetta Matters, MD [facility] Office: [phone] Pager: [phone]   NM Myocar Multi W/Spect Tamela Oddi Motion / EF  Result Date: 10/07/2020   Findings are consistent with no ischemia. The study is low risk.   No  ST deviation was noted.   LV perfusion is abnormal. There is no evidence of ischemia. Defect 1: There is a medium defect with mild reduction in uptake present in the apical to basal inferior, inferolateral and apex location(s) that is fixed. There is normal wall motion in the defect area. Consistent with artifact caused by bowel tracer uptake.   Left ventricular function is normal. The left ventricular ejection fraction is normal (55-65%). End diastolic cavity size is normal. End systolic cavity size is normal. Significant artifact from extracardiac/bowel activity, but images improve with stress. Not consistent with ischemia. Low risk study.   DG Chest Port 1 View  Result Date: 10/09/2020 CLINICAL DATA:  Status post vascular surgical bypass and placement central line. EXAM: PORTABLE CHEST 1 VIEW COMPARISON:  05/10/2020 FINDINGS: Right jugular central line present with the catheter tip located in the lower SVC. The heart size and mediastinal contours are within normal limits. There is no  evidence of pulmonary edema, consolidation, pneumothorax or pleural fluid. The visualized skeletal structures are unremarkable. IMPRESSION: Central line tip in SVC.  No pneumothorax or other acute findings. Electronically Signed   By: Aletta Edouard M.D.   On: 10/09/2020 12:45   DG Foot Complete Left  Result Date: 10/05/2020 CLINICAL DATA:  Generalized left foot pain, erythema for several days EXAM: LEFT FOOT - COMPLETE 3+ VIEW COMPARISON:  06/13/2020 FINDINGS: Frontal, oblique, and lateral views of the left foot are obtained. Prior amputation of the fifth digit at the level of the metatarsophalangeal joint. No acute or destructive bony lesions. Mild osteoarthritis of the first metatarsophalangeal joint and throughout the midfoot unchanged. Soft tissues are grossly unremarkable. IMPRESSION: 1. No acute or destructive bony lesions. 2. Stable degenerative changes. 3. Stable prior fifth digit amputation. Electronically Signed   By: Randa Ngo M.D.   On: 10/05/2020 17:28     The results of significant diagnostics from this hospitalization (including imaging, microbiology, ancillary and laboratory) are listed below for reference.     Microbiology: Recent Results (from the past 240 hour(s))  Blood culture (routine x 2)     Status: None   Collection Time: 10/05/20  5:13 PM   Specimen: BLOOD  Result Value Ref Range Status   Specimen Description   Final    BLOOD LEFT ANTECUBITAL Performed at [facility] [address], [place], [place] [address]    Special Requests   Final    BOTTLES DRAWN AEROBIC AND ANAEROBIC Blood Culture adequate volume Performed at [facility] [address], [place], [place] [address]    Culture   Final    NO GROWTH 5 DAYS Performed at [facility], [place] [address] [place], [place] [address]    Report Status 10/10/2020 FINAL  Final  Blood culture (routine x 2)     Status: None   Collection Time: 10/05/20  8:01 PM    Specimen: BLOOD  Result Value Ref Range Status   Specimen Description   Final    BLOOD LEFT ANTECUBITAL Performed at [facility] [address] [place], [place] [address]    Special Requests   Final    BOTTLES DRAWN AEROBIC AND ANAEROBIC Blood Culture adequate volume Performed at [facility] [address] [place], [place] [address]    Culture   Final    NO GROWTH 5 DAYS Performed at [facility], [place] [address], [place], [place] [address]    Report Status 10/11/2020 FINAL  Final  Resp Panel by RT-PCR (Flu A&B, Covid) Nasopharyngeal Swab     Status: None   Collection Time: 10/05/20  8:04 PM   Specimen: Nasopharyngeal Swab; Nasopharyngeal(NP) swabs in vial transport medium  Result Value Ref Range Status   SARS Coronavirus 2 by RT PCR NEGATIVE NEGATIVE Final    Comment: (NOTE) SARS-CoV-2 target nucleic acids are NOT DETECTED.  The SARS-CoV-2 RNA is generally detectable in upper respiratory specimens during the acute phase of infection. The lowest concentration of SARS-CoV-2 viral copies this assay can detect is 138 copies/mL. A negative result does not preclude SARS-Cov-2 infection and should not be used as the sole basis for treatment or other patient management decisions. A negative result may occur with  improper specimen collection/handling, submission of specimen other than nasopharyngeal swab, presence of viral mutation(s) within the areas targeted by this assay, and inadequate number of viral copies(<138 copies/mL). A negative result must be combined with clinical observations, patient history, and epidemiological information. The expected result is Negative.  Fact Sheet for Patients:  EntrepreneurPulse.com.au  Fact Sheet for Healthcare Providers:  IncredibleEmployment.be  This test is no t yet approved or cleared by the [place] FDA and  has been authorized for detection and/or  diagnosis of SARS-CoV-2 by FDA under an Emergency Use Authorization (EUA). This EUA will remain  in effect (meaning this test can be used) for the duration of the COVID-19 declaration under Section 564(b)(1) of the Act, 21 U.S.C.section 360bbb-3(b)(1), unless the authorization is terminated  or revoked sooner.       Influenza A by PCR NEGATIVE NEGATIVE Final   Influenza B by PCR NEGATIVE NEGATIVE Final    Comment: (NOTE) The Xpert Xpress SARS-CoV-2/FLU/RSV plus assay is intended as an aid in the diagnosis of influenza from Nasopharyngeal swab specimens and should not be used as a sole basis for treatment. Nasal washings and aspirates are unacceptable for Xpert Xpress SARS-CoV-2/FLU/RSV testing.  Fact Sheet for Patients: EntrepreneurPulse.com.au  Fact Sheet for Healthcare Providers: IncredibleEmployment.be  This test is not yet approved or cleared by the [place] FDA and has been authorized for detection and/or diagnosis of SARS-CoV-2 by FDA under an Emergency Use Authorization (EUA). This EUA will remain in effect (meaning this test can be used) for the duration of the COVID-19 declaration under Section 564(b)(1) of the Act, 21 U.S.C. section 360bbb-3(b)(1), unless the authorization is terminated or revoked.  Performed at [facility], [place] [address], [place], [place] [address]   Surgical pcr screen     Status: None   Collection Time: 10/08/20  9:05 PM   Specimen: Nasal Mucosa; Nasal Swab  Result Value Ref Range Status   MRSA, PCR NEGATIVE NEGATIVE Final   Staphylococcus aureus NEGATIVE NEGATIVE Final    Comment: (NOTE) The Xpert SA Assay (FDA approved for NASAL specimens in patients 70 years of age and older), is one component of a comprehensive surveillance program. It is not intended to diagnose infection nor to guide or monitor treatment. Performed at [facility], [place] [address] [place],  [place] [address]      Labs: BNP (last 3 results) No results for input(s): BNP in the last 8760 hours. Basic Metabolic Panel: Recent Labs  Lab 10/09/20 1216 10/10/20 0316 10/11/20 0123 10/12/20 0103 10/13/20 0124 10/14/20 0150  NA 131* 130* 130* 134* 133* 132*  K 3.5 4.2 4.2 3.7 3.4* 3.9  CL 98 99 98 100 98 101  CO2 _0 22  GLUCOSE 189* 125* 116* 92 101* 102*  BUN _0 7* 10 7*  CREATININE 0.91 0.89 0.82 0.84 0.85 0.83  CALCIUM 8.5* 8.9 9.0 9.0 9.0 9.1  MG 1.5* 2.1  --   --   --   --    Liver Function Tests: Recent Labs  Lab 10/10/20 0316  AST 18  ALT 13  ALKPHOS 50  BILITOT 0.5  PROT 6.1*  ALBUMIN 3.5   Recent Labs  Lab 10/10/20 0316  AMYLASE 60   No results for input(s): AMMONIA in the last 168 hours. CBC: Recent Labs  Lab 10/10/20 0316 10/11/20 0123 10/12/20 0103 10/13/20 0124 10/14/20 0150  WBC 9.6 8.1 7.5 5.9 5.2  HGB 10.4* 9.1* 9.2* 9.5* 9.0*  HCT 32.1* 27.8* 27.6* 28.3* 27.0*  MCV 90.4 89.4 88.7 89.0 87.4  PLT 248 229 272 341 371   Cardiac Enzymes: No results for input(s): CKTOTAL, CKMB, CKMBINDEX, TROPONINI in the last 168 hours. BNP: Invalid input(s): POCBNP CBG: Recent Labs  Lab 10/13/20 1138 10/13/20 1651 10/13/20 2100 10/14/20 0552 10/14/20 1111  GLUCAP 91 124* 165* 145* 104*   D-Dimer No results for input(s): DDIMER in the last 72 hours. Hgb A1c No results for input(s): HGBA1C in the last 72 hours. Lipid Profile No results for input(s): CHOL, HDL, LDLCALC, TRIG, CHOLHDL, LDLDIRECT in the last 72 hours. Thyroid function studies No results for input(s): TSH, T4TOTAL, T3FREE, THYROIDAB in the last 72 hours.  Invalid input(s): FREET3 Anemia work up No results for input(s): VITAMINB12, FOLATE, FERRITIN, TIBC, IRON, RETICCTPCT in the last 72 hours. Urinalysis    Component Value Date/Time   COLORURINE STRAW (A) 05/10/2020 1722   APPEARANCEUR CLEAR 05/10/2020 1722   LABSPEC 1.008 05/10/2020 1722   PHURINE 8.0 05/10/2020  1722   GLUCOSEU NEGATIVE 05/10/2020 1722   HGBUR NEGATIVE 05/10/2020 1722   BILIRUBINUR NEGATIVE 05/10/2020 1722   KETONESUR 5 (A) 05/10/2020 1722   PROTEINUR NEGATIVE 05/10/2020 1722   NITRITE NEGATIVE 05/10/2020 1722   LEUKOCYTESUR NEGATIVE 05/10/2020 1722   Sepsis Labs Invalid input(s): PROCALCITONIN,  WBC,  LACTICIDVEN Microbiology Recent Results (from the past 240 hour(s))  Blood culture (routine x 2)     Status: None   Collection Time: 10/05/20  5:13 PM   Specimen: BLOOD  Result Value Ref Range Status   Specimen Description   Final    BLOOD LEFT ANTECUBITAL Performed at [facility], [place] [address] [place], [place] [address]    Special Requests   Final    BOTTLES DRAWN AEROBIC AND ANAEROBIC Blood Culture adequate volume Performed at [facility] [address] [place], [place] [address]    Culture   Final    NO GROWTH 5 DAYS Performed at [facility], [place] [address], [place], [place] [address]    Report Status 10/10/2020 FINAL  Final  Blood culture (routine x 2)     Status: None   Collection Time: 10/05/20  8:01 PM   Specimen: BLOOD  Result Value Ref Range Status   Specimen Description   Final    BLOOD LEFT ANTECUBITAL Performed at [facility] [address] [place], [place] [address]    Special Requests   Final    BOTTLES DRAWN AEROBIC AND ANAEROBIC Blood Culture adequate volume Performed at [facility] [address], [place], [place] [address]    Culture   Final    NO GROWTH 5 DAYS Performed at [facility], [address], [place], [place] [address]    Report Status 10/11/2020 FINAL  Final  Resp Panel by RT-PCR (Flu A&B, Covid) Nasopharyngeal Swab     Status: None   Collection Time: 10/05/20  8:04 PM   Specimen: Nasopharyngeal Swab; Nasopharyngeal(NP) swabs in vial transport medium  Result Value Ref Range Status   SARS Coronavirus 2 by RT PCR NEGATIVE NEGATIVE  Final    Comment: (NOTE) SARS-CoV-2 target nucleic acids are NOT DETECTED.  The SARS-CoV-2 RNA is generally detectable in upper respiratory specimens during the acute phase of infection. The lowest concentration of SARS-CoV-2 viral copies this assay can detect is 138 copies/mL. A negative result does not preclude SARS-Cov-2 infection and should not be used as the sole basis for treatment or other patient management decisions. A negative result may occur with  improper specimen collection/handling, submission of specimen other than nasopharyngeal swab, presence of viral mutation(s) within the areas targeted by this assay, and inadequate number of viral copies(<138 copies/mL). A negative result must be combined with clinical observations, patient history, and epidemiological information. The expected result is Negative.  Fact Sheet for Patients:  EntrepreneurPulse.com.au  Fact Sheet for Healthcare Providers:  IncredibleEmployment.be  This test is no t yet approved or cleared by the [place] FDA and  has been authorized for detection and/or diagnosis of SARS-CoV-2 by FDA under an Emergency Use Authorization (EUA). This EUA will remain  in effect (meaning this test can be used) for the duration of the COVID-19 declaration under Section 564(b)(1) of the Act, 21 U.S.C.section 360bbb-3(b)(1), unless the authorization is terminated  or revoked sooner.       Influenza A by PCR NEGATIVE NEGATIVE Final   Influenza B by PCR NEGATIVE NEGATIVE Final    Comment: (NOTE) The Xpert Xpress SARS-CoV-2/FLU/RSV plus assay is intended as an aid in the diagnosis of influenza from Nasopharyngeal swab specimens and should not be used as a sole basis for treatment. Nasal washings and aspirates are unacceptable for Xpert Xpress SARS-CoV-2/FLU/RSV testing.  Fact Sheet for Patients: EntrepreneurPulse.com.au  Fact Sheet for Healthcare  Providers: IncredibleEmployment.be  This test is not yet approved or cleared by the [place] FDA and has been authorized for detection and/or diagnosis of SARS-CoV-2 by FDA under an Emergency Use Authorization (EUA). This EUA will remain in effect (meaning this test can be used) for the duration of the COVID-19 declaration under Section 564(b)(1) of the Act, 21 U.S.C. section 360bbb-3(b)(1), unless the authorization is terminated or revoked.  Performed at [facility], [place] [address] [place], [place] [address]   Surgical pcr screen     Status: None   Collection Time: 10/08/20  9:05 PM   Specimen: Nasal Mucosa; Nasal Swab  Result Value Ref Range Status   MRSA, PCR NEGATIVE NEGATIVE Final   Staphylococcus aureus NEGATIVE NEGATIVE Final    Comment: (NOTE) The Xpert SA Assay (FDA approved for NASAL specimens in patients 12 years of age and older), is one component of a comprehensive surveillance program. It is not intended to diagnose infection nor to guide or monitor treatment. Performed at [facility], [place] [address] [place], [place] [address]      Time coordinating discharge in minutes: 65  SIGNED:   Debbe Odea, MD  [facility] 10/14/2020, 4:00 PM

## 2020-10-14 NOTE — Progress Notes (Signed)
PROGRESS NOTE    [name].   [id number]  DOB: May 08, 1960  DOA: 10/05/2020 PCP: Ruben Mile, NP   Brief Narrative:  [name]. 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 The patient presented to the ED with severe left leg pain which he said was progressive over the past 2 weeks.  CTA of aorta with iliofemoral runoff revealed a complete occlusion of the stent in the left superficial femoral artery and nonopacification of the left posterior tibial artery.  Vascular surgery consult was requested.   Subjective: No complaints today    Assessment & Plan:   Principal Problem:   Critical lower limb ischemia, peripheral artery disease Mild lactic acidosis - Lactic acid level noted to be 2.2 on 10/05/2020 -He has been taking Lipitor aspirin and Plavix at home - Continue heparin infusion, aspirin and Lipitor-Plavix is on hold - Continue pain control -Cardiology clearance requested by vascular surgery-the patient underwent a nuclear stress test which is a low risk study-2D echo shows a normal LV function-- - Risk factor modifications discussed with patient - 9/15> s/p aroto-bi-femoral bypass, left and right femoral endarterectomy  - incisions appear clean-  awaiting vascular surgery to make decision on anticoagulation prior to discharging this gentleman    Active Problems:  Anemia - Hb has been steadily dropping but has been stable at 9 since 9/17 - Hgb: 13.4> 10.4> 9.5 - hemoccult + on 9/18  > started BID Protonix -  he has had multiple BMs and none have had any gross blood - continuing to follow Hgb daily -   - daily hemoccults ordered- he had a BM yesterday but hemoccult was not sent- I have addressed  this with RNs today  Hyponatremia - follow- likely due to HCTZ which is on hold since 9/14 - improved to 134 today  Hypertension  - BP has been low to normal -holding lisinopril/HCTZ in the perioperative period -continue as needed hydralazine  Hypomagnesemia - has been replaced     Type 2 diabetes mellitus with peripheral neuropathy - he is no on medications at home -Continue sliding scale NovoLog and gabapentin - Hemoglobin A1C    Component Value Date/Time   HGBA1C 5.5 10/06/2020 0537      Nicotine dependence, cigarettes, uncomplicated -Has been counseled on quitting smoking     Time spent in minutes: 35 DVT prophylaxis: heparin Code Status: Full code Family Communication:  Level of Care: Level of care: Progressive Disposition Plan:  Status is: Inpatient  Remains inpatient appropriate because:Inpatient level of care appropriate due to severity of illness  Dispo: The patient is from: Home              Anticipated d/c is to: Home              Patient currently is not medically stable to d/c.   Difficult to place patient No  Consultants:  Vascular surgery Cardiology  Procedures:  Nuclear stress test Aortogram with bilateral lower extremity runoff Antimicrobials:  Anti-infectives (From admission, onward)    Start     Dose/Rate Route Frequency Ordered Stop   10/09/20 1345  ceFAZolin (ANCEF) IVPB 2g/100 mL premix        2 g 200 mL/hr over 30 Minutes Intravenous Every 8 hours 10/09/20 1333 10/09/20 2333  10/09/20 0600  ceFAZolin (ANCEF) IVPB 1 g/50 mL premix        1 g 100 mL/hr over 30 Minutes Intravenous To Short Stay 10/08/20 0825 10/09/20 0815        Objective: Vitals:   10/13/20 2310 10/14/20 0353 10/14/20 0723 10/14/20 1113  BP: (!) 123/92 (!) 144/113 (!) 146/90 (!) 155/85  Pulse: 95 86 83 91  Resp: 17 (!) '21 20 19  '$ Temp: 98.2 F (36.8 C) 98.9 F (37.2 C) 99.1 F (37.3 C) 98.8 F (37.1 C)  TempSrc: Oral Oral Oral Oral  SpO2: 99% 98% 100%  100%  Weight:      Height:        Intake/Output Summary (Last 24 hours) at 10/14/2020 1221 Last data filed at 10/14/2020 0532 Gross per 24 hour  Intake --  Output 530 ml  Net -530 ml    Filed Weights   10/05/20 1641  Weight: 46 kg    Examination: General exam: Appears comfortable  HEENT: PERRLA, oral mucosa moist, no sclera icterus or thrush Respiratory system: Clear to auscultation. Respiratory effort normal. Cardiovascular system: S1 & S2 heard, regular rate and rhythm Gastrointestinal system: Abdomen soft, non-tender, nondistended. Normal bowel sounds  - wounds on abdomen appear clean Central nervous system: Alert and oriented. No focal neurological deficits. Extremities: No cyanosis, clubbing or edema Skin: No rashes or ulcers Psychiatry:  Mood & affect appropriate.      Data Reviewed: I have personally reviewed following labs and imaging studies  CBC: Recent Labs  Lab 10/10/20 0316 10/11/20 0123 10/12/20 0103 10/13/20 0124 10/14/20 0150  WBC 9.6 8.1 7.5 5.9 5.2  HGB 10.4* 9.1* 9.2* 9.5* 9.0*  HCT 32.1* 27.8* 27.6* 28.3* 27.0*  MCV 90.4 89.4 88.7 89.0 87.4  PLT 248 229 272 [id number]    Basic Metabolic Panel: Recent Labs  Lab 10/09/20 1216 10/10/20 0316 10/11/20 0123 10/12/20 0103 10/13/20 0124 10/14/20 0150  NA 131* 130* 130* 134* 133* 132*  K 3.5 4.2 4.2 3.7 3.4* 3.9  CL 98 99 98 100 98 101  CO2 '24 23 25 23 23 22  '$ GLUCOSE 189* 125* 116* 92 101* 102*  BUN '9 9 8 '$ 7* 10 7*  CREATININE 0.91 0.89 0.82 0.84 0.85 0.83  CALCIUM 8.5* 8.9 9.0 9.0 9.0 9.1  MG 1.5* 2.1  --   --   --   --     GFR: Estimated Creatinine Clearance: 60.8 mL/min (by C-G formula based on SCr of 0.83 mg/dL). Liver Function Tests: Recent Labs  Lab 10/10/20 0316  AST 18  ALT 13  ALKPHOS 50  BILITOT 0.5  PROT 6.1*  ALBUMIN 3.5    Recent Labs  Lab 10/10/20 0316  AMYLASE 60    No results for input(s): AMMONIA in the last 168 hours. Coagulation Profile: Recent Labs   Lab 10/09/20 1216  INR 1.1    Cardiac Enzymes: No results for input(s): CKTOTAL, CKMB, CKMBINDEX, TROPONINI in the last 168 hours. BNP (last 3 results) No results for input(s): PROBNP in the last 8760 hours. HbA1C: No results for input(s): HGBA1C in the last 72 hours.  CBG: Recent Labs  Lab 10/13/20 1138 10/13/20 1651 10/13/20 2100 10/14/20 0552 10/14/20 1111  GLUCAP 91 124* 165* 145* 104*    Lipid Profile: No results for input(s): CHOL, HDL, LDLCALC, TRIG, CHOLHDL, LDLDIRECT in the last 72 hours.  Thyroid Function Tests: No results for input(s): TSH, T4TOTAL, FREET4, T3FREE, THYROIDAB in the last 72 hours. Anemia Panel: No  results for input(s): VITAMINB12, FOLATE, FERRITIN, TIBC, IRON, RETICCTPCT in the last 72 hours. Urine analysis:    Component Value Date/Time   COLORURINE STRAW (A) 05/10/2020 1722   APPEARANCEUR CLEAR 05/10/2020 1722   LABSPEC 1.008 05/10/2020 1722   PHURINE 8.0 05/10/2020 1722   GLUCOSEU NEGATIVE 05/10/2020 1722   HGBUR NEGATIVE 05/10/2020 1722   BILIRUBINUR NEGATIVE 05/10/2020 1722   KETONESUR 5 (A) 05/10/2020 1722   PROTEINUR NEGATIVE 05/10/2020 1722   NITRITE NEGATIVE 05/10/2020 1722   LEUKOCYTESUR NEGATIVE 05/10/2020 1722   Sepsis Labs: '@LABRCNTIP'$ (procalcitonin:4,lacticidven:4) ) Recent Results (from the past 240 hour(s))  Blood culture (routine x 2)     Status: None   Collection Time: 10/05/20  5:13 PM   Specimen: BLOOD  Result Value Ref Range Status   Specimen Description   Final    BLOOD LEFT ANTECUBITAL Performed at [facility], [place] [address] [place], [place] [address]    Special Requests   Final    BOTTLES DRAWN AEROBIC AND ANAEROBIC Blood Culture adequate volume Performed at [facility] [address] [place], [place] [address]    Culture   Final    NO GROWTH 5 DAYS Performed at [facility], [place] [address], [place], [place] [address]    Report Status 10/10/2020 FINAL   Final  Blood culture (routine x 2)     Status: None   Collection Time: 10/05/20  8:01 PM   Specimen: BLOOD  Result Value Ref Range Status   Specimen Description   Final    BLOOD LEFT ANTECUBITAL Performed at [facility] [address], [place], [place] [address]    Special Requests   Final    BOTTLES DRAWN AEROBIC AND ANAEROBIC Blood Culture adequate volume Performed at [facility] [address], [place], [place] [address]    Culture   Final    NO GROWTH 5 DAYS Performed at [facility], [place] [address], [place], [place] [address]    Report Status 10/11/2020 FINAL  Final  Resp Panel by RT-PCR (Flu A&B, Covid) Nasopharyngeal Swab     Status: None   Collection Time: 10/05/20  8:04 PM   Specimen: Nasopharyngeal Swab; Nasopharyngeal(NP) swabs in vial transport medium  Result Value Ref Range Status   SARS Coronavirus 2 by RT PCR NEGATIVE NEGATIVE Final    Comment: (NOTE) SARS-CoV-2 target nucleic acids are NOT DETECTED.  The SARS-CoV-2 RNA is generally detectable in upper respiratory specimens during the acute phase of infection. The lowest concentration of SARS-CoV-2 viral copies this assay can detect is 138 copies/mL. A negative result does not preclude SARS-Cov-2 infection and should not be used as the sole basis for treatment or other patient management decisions. A negative result may occur with  improper specimen collection/handling, submission of specimen other than nasopharyngeal swab, presence of viral mutation(s) within the areas targeted by this assay, and inadequate number of viral copies(<138 copies/mL). A negative result must be combined with clinical observations, patient history, and epidemiological information. The expected result is Negative.  Fact Sheet for Patients:  EntrepreneurPulse.com.au  Fact Sheet for Healthcare Providers:  IncredibleEmployment.be  This test is no t yet  approved or cleared by the [place] FDA and  has been authorized for detection and/or diagnosis of SARS-CoV-2 by FDA under an Emergency Use Authorization (EUA). This EUA will remain  in effect (meaning this test can be used) for the duration of the COVID-19 declaration under Section 564(b)(1)  of the Act, 21 U.S.C.section 360bbb-3(b)(1), unless the authorization is terminated  or revoked sooner.       Influenza A by PCR NEGATIVE NEGATIVE Final   Influenza B by PCR NEGATIVE NEGATIVE Final    Comment: (NOTE) The Xpert Xpress SARS-CoV-2/FLU/RSV plus assay is intended as an aid in the diagnosis of influenza from Nasopharyngeal swab specimens and should not be used as a sole basis for treatment. Nasal washings and aspirates are unacceptable for Xpert Xpress SARS-CoV-2/FLU/RSV testing.  Fact Sheet for Patients: EntrepreneurPulse.com.au  Fact Sheet for Healthcare Providers: IncredibleEmployment.be  This test is not yet approved or cleared by the [place] FDA and has been authorized for detection and/or diagnosis of SARS-CoV-2 by FDA under an Emergency Use Authorization (EUA). This EUA will remain in effect (meaning this test can be used) for the duration of the COVID-19 declaration under Section 564(b)(1) of the Act, 21 U.S.C. section 360bbb-3(b)(1), unless the authorization is terminated or revoked.  Performed at [facility], [place] [address] [place], [place] [address]   Surgical pcr screen     Status: None   Collection Time: 10/08/20  9:05 PM   Specimen: Nasal Mucosa; Nasal Swab  Result Value Ref Range Status   MRSA, PCR NEGATIVE NEGATIVE Final   Staphylococcus aureus NEGATIVE NEGATIVE Final    Comment: (NOTE) The Xpert SA Assay (FDA approved for NASAL specimens in patients 37 years of age and older), is one component of a comprehensive surveillance program. It is not intended to diagnose infection nor to guide or  monitor treatment. Performed at [facility], [place] [address], [place], [place] [address]          Radiology Studies: No results found.    Scheduled Meds:  acetaminophen  650 mg Oral Q6H   bisacodyl  10 mg Oral Daily   docusate sodium  200 mg Oral BID   heparin  5,000 Units Subcutaneous Q8H   insulin aspart  0-9 Units Subcutaneous TID WC   pantoprazole  40 mg Oral BID AC   Continuous Infusions:     LOS: 9 days      Debbe Odea, MD [facility] Pager: [email or web address] 10/14/2020, 12:21 PM

## 2020-10-14 NOTE — Progress Notes (Addendum)
Mobility Specialist Progress Note   10/14/20 1222  Mobility  Activity Ambulated in hall  Level of Assistance Contact guard assist, steadying assist  Assistive Device Front wheel walker  Distance Ambulated (ft) 76 ft  Mobility Ambulated with assistance in hallway  Mobility Response Tolerated fair  Mobility performed by Mobility specialist  $Mobility charge 1 Mobility   Received pt in bed c/o having "sharp" pain in left toe, 8/10 on pain scale. Requested for meds before going on mobility session. Pt is Mod Independent for bed mobility and contact guard sit < > stand. x1 standing break d/t pain inc. Pt returned back to bed saying their pain was an 8.5/10 and that they "didn't want to get up any more today". Told pt we'd be back later today to check on them, call bell was left by pt's side.   Pre Mobility: 80 HR, 152/91 BP During Mobility: 101 HR Post Mobility: 95 HR, 161/87 BP  Holland Falling Mobility Specialist Phone Number [phone]

## 2020-10-14 NOTE — Progress Notes (Addendum)
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 (Prior left SFA, pop and CIA stent placement 06/10/2020.) BLE well perfused. Left great toe pain. No history of gout. Temp: 99.1. H and H relatively stable. VSS.   Abdominal pain: No N or V. Loose BM. He says he wants solid food but doesn't have his dentures> If tolerates his grits this morning, consider advancing to dysphagia 3 diet.   Anemia: non-bloody heme + stool. On PPI, now BID dosing. Plavix on hold. Hgb down 1/2 gram last 24  hours. Safe to resume aspirin from our perspective.   DVT prophy: heparin [place]   Dispo: home with HHPT   SUBJECTIVE:   Complaining of left great toe pain (chronic, present PTA).  Complaining of cramping abd pain, watery stools. No N.V  PHYSICAL EXAM:   Vitals:   10/13/20 1543 10/13/20 1948 10/13/20 2310 10/14/20 0353  BP: 138/85 (!) 143/87 (!) 123/92 (!) 144/113  Pulse: 80 90 95 86  Resp: '19 19 17 '$ (!) 21  Temp: 98.9 F (37.2 C) 98.5 F (36.9 C) 98.2 F (36.8 C) 98.9 F (37.2 C)  TempSrc: Oral Oral Oral Oral  SpO2: 98% 98% 99% 98%  Weight:      Height:        LABS:   General appearance: Awake, alert in no apparent distress Cardiac: Heart rate and rhythm are regular Respirations: Nonlabored Abdomen: soft, ND, normoactive BS Incisions: Right/Left groin, midline incisions are all well approximated without bleeding or hematoma. No further drainage noted. Extremities: Both feet are warm with intact sensation and motor function.  Ischemic changes noted and unchanged>>dry eschar dorsum right foot Pulse/Doppler exam:  Brisk right dorsalis pedis, posterior tibial, peroneal and brisk left DP and peroneal artery Doppler signals.  Lab Results  Component Value Date   WBC 5.2 10/14/2020   HGB 9.0 (L) 10/14/2020   HCT 27.0 (L)  10/14/2020   MCV 87.4 10/14/2020   PLT 371 10/14/2020   Lab Results  Component Value Date   CREATININE 0.83 10/14/2020   Lab Results  Component Value Date   INR 1.1 10/09/2020   CBG (last 3)  Recent Labs    10/13/20 1651 10/13/20 2100 10/14/20 0552  GLUCAP 124* 165* 145*    PROBLEM LIST:    Principal Problem:   Critical lower limb ischemia (HCC) Active Problems:   Essential hypertension   Type 2 diabetes mellitus without complication, without long-term current use of insulin (HCC)   Nicotine dependence, cigarettes, uncomplicated   Mixed hyperlipidemia   CURRENT MEDS:    acetaminophen  650 mg Oral Q6H   bisacodyl  10 mg Oral Daily   Chlorhexidine Gluconate Cloth  6 each Topical Daily   docusate sodium  200 mg Oral BID   heparin  5,000 Units Subcutaneous Q8H   insulin aspart  0-9 Units Subcutaneous TID WC   pantoprazole  40 mg Oral BID AC    Barbie Banner, PA-C  Office: [phone] 10/14/2020   VASCULAR STAFF ADDENDUM: I have independently interviewed and examined the patient. I agree with the above.  Looks good after ABF 10/09/20. Tolerating diet. Feet warm and well perfused. Palpable pulse in groins. Incisions clean and dry. Pain control an issue. Encouraged him to try PO options besides tylenol. Will change oxycodone to tramadol.  Hgb drift noted. I do not think this is  pathologic, but more likely postoperative.  No need for Plavix going forward. Would resume ASA '81mg'$  PO daily. Ready for home once pain well controlled from my standpoint.  [name], MD [facility] Phone Number: [phone] 10/14/2020 12:06 PM

## 2020-10-14 NOTE — TOC Transition Note (Signed)
Transition of Care [facility]) - CM/SW Discharge Note   Patient Details  Name: [name]. MRN: [id number] Date of Birth: 1960-05-12  Transition of Care [facility] CM/SW Contact:  Cyndi Bender, RN Phone Number: 10/14/2020, 4:52 PM   Clinical Narrative:   patient stable for discharge. Order for PT, RN and aide. CM went to speak to patient at bedside. Patient agreeable. List provided for choice per [email or web address] Patient states he has use [facility] in the past and this is his first choice. Confirmed address, phone number and PCP in chart. Spoke to [place], referral excepted.  Asked patient about transportation family is at a funeral. Patient to let bedside nurse if transportation needed.    Final next level of care: [place] Barriers to Discharge: Barriers Resolved   Patient Goals and CMS Choice Patient states their goals for this hospitalization and ongoing recovery are:: return home CMS Medicare.gov Compare Post Acute Care list provided to:: Patient Choice offered to / list presented to : Patient  Discharge Placement                 Home with home health      Discharge Plan and Services In-house Referral: NA Discharge Planning Services: CM Consult Post Acute Care Choice: Home Health                    HH Arranged: RN, Nurse's Aide, PT [facility] Agency: [place] Date [place]: 10/14/20 Time [place]: [phone] Representative spoke with at [place]: [place] ([place]) Interventions     Readmission Risk Interventions Readmission Risk Prevention Plan 06/16/2020  Post Dischage Appt Complete  Medication Screening Complete  Transportation Screening Complete  Some recent data might be hidden

## 2020-10-15 DIAGNOSIS — I70229 Atherosclerosis of native arteries of extremities with rest pain, unspecified extremity: Secondary | ICD-10-CM | POA: Diagnosis not present

## 2020-10-15 LAB — BASIC METABOLIC PANEL WITH GFR
Anion gap: 7 (ref 5–15)
BUN: 8 mg/dL (ref 8–23)
CO2: 24 mmol/L (ref 22–32)
Calcium: 9.3 mg/dL (ref 8.9–10.3)
Chloride: 103 mmol/L (ref 98–111)
Creatinine, Ser: 0.86 mg/dL (ref 0.61–1.24)
GFR, Estimated: >60 mL/min
Glucose, Bld: 94 mg/dL (ref 70–99)
Potassium: 4.2 mmol/L (ref 3.5–5.1)
Sodium: 134 mmol/L — ABNORMAL LOW (ref 135–145)

## 2020-10-15 LAB — CBC
HCT: 27.2 % — ABNORMAL LOW (ref 39.0–52.0)
Hemoglobin: 9.2 g/dL — ABNORMAL LOW (ref 13.0–17.0)
MCH: 29.9 pg (ref 26.0–34.0)
MCHC: 33.8 g/dL (ref 30.0–36.0)
MCV: 88.3 fL (ref 80.0–100.0)
Platelets: 412 10*3/uL — ABNORMAL HIGH (ref 150–400)
RBC: 3.08 MIL/uL — ABNORMAL LOW (ref 4.22–5.81)
RDW: 16.2 % — ABNORMAL HIGH (ref 11.5–15.5)
WBC: 5.5 10*3/uL (ref 4.0–10.5)
nRBC: 0 % (ref 0.0–0.2)

## 2020-10-15 LAB — GLUCOSE, CAPILLARY
Glucose-Capillary: 107 mg/dL — ABNORMAL HIGH (ref 70–99)
Glucose-Capillary: 101 mg/dL — ABNORMAL HIGH (ref 70–99)

## 2020-10-15 MED ORDER — ASPIRIN EC 81 MG PO TBEC
81.0000 mg | DELAYED_RELEASE_TABLET | Freq: Every day | ORAL | Status: DC
  Administered 2020-10-15: 81 mg via ORAL
  Filled 2020-10-15: qty 1

## 2020-10-15 MED ORDER — TRAMADOL HCL 50 MG PO TABS: 50.0000 mg | ORAL_TABLET | Freq: Four times a day (QID) | ORAL | 0 refills | Status: DC | PRN

## 2020-10-15 MED FILL — Sodium Chloride IV Soln 0.9%: INTRAVENOUS | Qty: 1000 | Status: AC

## 2020-10-15 MED FILL — Heparin Sodium (Porcine) Inj 1000 Unit/ML: INTRAMUSCULAR | Qty: 30 | Status: AC

## 2020-10-15 NOTE — Discharge Instructions (Signed)
 [facility]  Discharge Instructions   Open Aortic Surgery  Please refer to the following instructions for your post-procedure care. Your surgeon or Physician Assistant will discuss any changes with you.  Activity  Avoid lifting more than eight pounds (a gallon of milk) until after your first post-operative visit. You are encouraged to walk as much as you can. You can slowly return to normal activities but must avoid strenuous activity and heavy lifting until your doctor tells you it's OK. Heavy lifting can hurt the incision and cause a hernia. Avoid activities such as vacuuming or swinging a golf club. It is normal to feel tired for several weeks after your surgery. Do not drive until your doctor gives the OK and you are no longer taking prescription pain medications. It is also normal to have difficulty with sleep habits, eating and bowl movements after surgery. These will go away with time.  Bathing/Showering  You may shower after you go home. Do not soak in a bathtub, hot tub, or swim until the incision heals.  Incision Care  Shower every day. Clean your incision with mild soap and water. Pat the area dry with a clean towel. You do not need a bandage unless otherwise instructed. Do not apply any ointments or creams to your incision. You may have skin glue on your incision. Do not peel it off. It will come off on its own in about one week. If you have staples or sutures along your incision, they will be removed at your post op appointment.  Diet  Resume your normal diet. There are no special food restriction following this procedure. A low fat/low cholesterol diet is recommended for all patients with vascular disease. After your aortic surgery, it's normal to feel full faster than usual and to not feel as hungry as you normally would. You will probably lose weight initially following your surgery. It's best to eat small, frequent meals over the course of  the day. Call the office if you find that you are unable to eat even small meals. In order to heal from your surgery, it is CRITICAL to get adequate nutrition. Your body requires vitamins, minerals, and protein. Vegetables are the best source of vitamins and minerals. causing pain, you may take over-the-counter pain reliever such as acetaminophen (Tylenol). If you were prescribed a stronger pain medication, please be aware these medication can cause nausea and constipation. Prevent nausea by taking the medication with a snack or meal. Avoid constipation by drinking plenty of fluids and eating foods with a high amount of fiber, such as fruits, vegetables and grains. Take 100mg of the over-the-counter stool softener Colace twice a day as needed to help with constipation. A laxative, such as Milk of Magnesia, may be recommended for you at this time. Do not take a laxative unless your surgeon or P.A. tells you it's OK. Do not take Tylenol if you are taking stronger pain medications (such as Percocet).  Follow Up  Our office will schedule a follow up appointment 2-3 weeks after discharge.  Please call us immediately for any of the following conditions    .     Severe or worsening pain in your legs or feet or in your abdomen back or chest. Increased pain, redness drainage (pus) from your incision site. Increased abdominal pain, bloating, nausea, vomiting, or persistent diarrhea. Fever of 101 degrees or higher. Swelling in your leg (s).  Reduce your risk of vascular disease  Stop smoking.   If you would like help, call [facility] at 1-800-QUIT-NOW (1-800-784-8669) or [facility] at 336-586-4000. Manage your cholesterol Maintain a desired weight Control your diabetes Keep your blood pressure down  If you have any questions please call the office at [phone].   

## 2020-10-15 NOTE — Progress Notes (Signed)
Physical Therapy Treatment Patient Details Name: [name]. MRN: [id number] DOB: [birth year] Today's Date: 10/15/2020   History of Present Illness The pt is a 60 yo male presenting 10/05/20 with c/o LLE pain following recent L pinky toe amputation on 5/20. Upon work-up pt LLE found to be cold to the touch with no palpable pulses. Pt s/p ultrasound-guided cannulation of R CFA on 9/12, now s/p aorto-bi-femoral bypass with bilateral endarterectomies on 9/15. PMH includes: DM II, PAD, R SFA arthrectomy and balloon angioplasty 6/21, stenting of L SFA and L CIA 5/22, HLD, HTN, current tobacco and marijuana use.   PT Comments    Pt progressing with mobility, planning for d/c home today. Today's session focused on transfer and gait training with RW, progressing to use of pt's SPC, noted improvements in stability and activity tolerance. Educ re: activity recommendations, fall risk reduction, edema control, DVT prevention, importance of mobility. If pt to remain admitted, will continue to follow acutely to address established goals.    Recommendations for follow up therapy are one component of a multi-disciplinary discharge planning process, led by the attending physician.  Recommendations may be updated based on patient status, additional functional criteria and insurance authorization.  Follow Up Recommendations  Home health PT;Supervision for mobility/OOB     Equipment Recommendations  None recommended by PT    Recommendations for Other Services       Precautions / Restrictions Precautions Precautions: Fall Restrictions Weight Bearing Restrictions: No     Mobility  Bed Mobility Overal bed mobility: Modified Independent Bed Mobility: Supine to Sit           General bed mobility comments: HOB elevated    Transfers Overall transfer level: Needs assistance Equipment used: Rolling walker (2 wheeled) Transfers: Sit to/from Stand Sit to Stand: Supervision         General  transfer comment: Cues for hand placement  Ambulation/Gait Ambulation/Gait assistance: Supervision Gait Distance (Feet): 82 Feet (+ 10') Assistive device: Rolling walker (2 wheeled) Gait Pattern/deviations: Step-to pattern;Antalgic;Decreased weight shift to left;Trunk flexed Gait velocity: Decreased   General Gait Details: Slow, antalgic gait with RW and supervision for safety; encouraged increased WBAT through LLE, but pt reports limited by pain; additional short gait trial with SPC, good stability with supervision, pt reports preference to use SPC   Stairs Stairs:  (declined additional stair training; reviewed safe technique with SPC)           Wheelchair Mobility    Modified Rankin (Stroke Patients Only)       Balance Overall balance assessment: Needs assistance Sitting-balance support: No upper extremity supported;Feet supported Sitting balance-Leahy Scale: Good     Standing balance support: Bilateral upper extremity supported;No upper extremity supported;Single extremity supported Standing balance-Leahy Scale: Fair Standing balance comment: Can static stand without UE support                            Cognition Arousal/Alertness: Awake/alert Behavior During Therapy: WFL for tasks assessed/performed Overall Cognitive Status: Within Functional Limits for tasks assessed                                 General Comments: WFL for simple tasks, not formally assessed; suspect near baseline cognition      Exercises      General Comments General comments (skin integrity, edema, etc.): Educ re: positioning, edema control, DVT prevention,  fall risk reduction, therex, activity recommendations, importance of mobility      Pertinent Vitals/Pain Pain Assessment: 0-10 Pain Score: 5  Pain Location: L LE Pain Descriptors / Indicators: Discomfort;Sore Pain Intervention(s): Monitored during session    Home Living                       Prior Function            PT Goals (current goals can now be found in the care plan section) Progress towards PT goals: Progressing toward goals    Frequency    Min 3X/week      PT Plan Current plan remains appropriate;Equipment recommendations need to be updated    Co-evaluation              AM-PAC PT "6 Clicks" Mobility   Outcome Measure  Help needed turning from your back to your side while in a flat bed without using bedrails?: None Help needed moving from lying on your back to sitting on the side of a flat bed without using bedrails?: None Help needed moving to and from a bed to a chair (including a wheelchair)?: A Little Help needed standing up from a chair using your arms (e.g., wheelchair or bedside chair)?: A Little Help needed to walk in hospital room?: A Little Help needed climbing 3-5 steps with a railing? : A Little 6 Click Score: 20    End of Session Equipment Utilized During Treatment: Gait belt Activity Tolerance: Patient tolerated treatment well Patient left: in bed;with call bell/phone within reach;with bed alarm set Nurse Communication: Mobility status PT Visit Diagnosis: Other abnormalities of gait and mobility (R26.89);Pain;History of falling (Z91.81);Unsteadiness on feet (R26.81);Difficulty in walking, not elsewhere classified (R26.2)     Time: [id number] PT Time Calculation (min) (ACUTE ONLY): 14 min  Charges:  $Gait Training: 8-22 mins                     Mabeline Caras, PT, DPT Acute Rehabilitation Services  Pager [phone] Office [place] 10/15/2020, 8:38 AM

## 2020-10-15 NOTE — Progress Notes (Signed)
Pt discharging home, per order. AVS reviewed with pt and all questions were answered. Telemetry box removed. Pt has all belongings packed. Pt's ride is on the way.

## 2020-10-15 NOTE — Progress Notes (Signed)
Mobility Specialist Progress Note    10/15/20 1215  Mobility  Activity Refused mobility    Pt refused d/t d/c departure.  Holland Falling Mobility Specialist Phone Number [phone]

## 2020-10-15 NOTE — Progress Notes (Addendum)
  Progress Note    10/15/2020 7:49 AM 6 Days Post-Op  Subjective:  no complaints.  Pt says transportation arranged for discharge home around noon today   Vitals:   10/15/20 0316 10/15/20 0732  BP: 107/75 108/67  Pulse: 80 73  Resp: 18 20  Temp: 98.6 F (37 C) 97.7 F (36.5 C)  SpO2: 98% 99%   Physical Exam: Lungs:  non labored Incisions:  groins and abd healing well Extremities:  feet warm and well perfused Abdomen:  soft, NT, ND Neurologic: A&O  CBC    Component Value Date/Time   WBC 5.5 10/15/2020 0139   RBC 3.08 (L) 10/15/2020 0139   HGB 9.2 (L) 10/15/2020 0139   HGB 11.7 (L) 12/12/2019 1006   HCT 27.2 (L) 10/15/2020 0139   HCT 36.2 (L) 12/12/2019 1006   PLT 412 (H) 10/15/2020 0139   PLT 249 12/12/2019 1006   MCV 88.3 10/15/2020 0139   MCV 90 12/12/2019 1006   MCH 29.9 10/15/2020 0139   MCHC 33.8 10/15/2020 0139   RDW 16.2 (H) 10/15/2020 0139   RDW 14.5 12/12/2019 1006   LYMPHSABS 1.9 10/06/2020 0537   LYMPHSABS 1.5 07/31/2019 1401   MONOABS 0.8 10/06/2020 0537   EOSABS 0.1 10/06/2020 0537   EOSABS 0.2 07/31/2019 1401   BASOSABS 0.0 10/06/2020 0537   BASOSABS 0.0 07/31/2019 1401    BMET    Component Value Date/Time   NA 134 (L) 10/15/2020 0139   NA 136 12/12/2019 1006   K 4.2 10/15/2020 0139   CL 103 10/15/2020 0139   CO2 24 10/15/2020 0139   GLUCOSE 94 10/15/2020 0139   BUN 8 10/15/2020 0139   BUN 18 12/12/2019 1006   CREATININE 0.86 10/15/2020 0139   CALCIUM 9.3 10/15/2020 0139   GFRNONAA >60 10/15/2020 0139   GFRAA 82 12/12/2019 1006    INR    Component Value Date/Time   INR 1.1 10/09/2020 1216     Intake/Output Summary (Last 24 hours) at 10/15/2020 0749 Last data filed at 10/15/2020 0518 Gross per 24 hour  Intake --  Output 500 ml  Net -500 ml     Assessment/Plan:  60 y.o. male is s/p ABF bypass 6 Days Post-Op   BLE well perfused Tolerating regular diet; daily BMs Incisions healing well Ok for discharge; scheduled for  office visit 10/11   Dagoberto Ligas, PA-C [facility] [phone] 10/15/2020 7:49 AM  VASCULAR STAFF ADDENDUM: I agree with the above.   [name], MD [facility] Phone Number: [phone] 10/15/2020 10:00 AM

## 2020-10-15 NOTE — Progress Notes (Signed)
PROGRESS NOTE    [name].   [id number]  DOB: 28-Apr-1960  DOA: 10/05/2020 PCP: Cipriano Mile, NP   Brief Narrative:  [name]. 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 The patient presented to the ED with severe left leg pain which he said was progressive over the past 2 weeks.  CTA of aorta with iliofemoral runoff revealed a complete occlusion of the stent in the left superficial femoral artery and nonopacification of the left posterior tibial artery.  Vascular surgery consult was requested.  Patient was discharged yesterday.  Family never picked him up due to some family funeral. Most likely sister will be picking up in the early afternoon today  Subjective: Patient has no new complaints.  He was just waiting for a ride.  Assessment & Plan:   Principal Problem:   Critical lower limb ischemia, peripheral artery disease Mild lactic acidosis - Lactic acid level noted to be 2.2 on 10/05/2020 -He has been taking Lipitor aspirin and Plavix at home - Continue heparin infusion, aspirin and Lipitor-Plavix is on hold - Continue pain control -Cardiology clearance requested by vascular surgery-the patient underwent a nuclear stress test which is a low risk study-2D echo shows a normal LV function-- - Risk factor modifications discussed with patient - 9/15> s/p aroto-bi-femoral bypass, left and right femoral endarterectomy  - incisions appear clean-  awaiting vascular surgery to make decision on anticoagulation prior to discharging this gentleman    Active Problems:  Anemia - Hb has been steadily dropping but has been stable at 9 since 9/17 - Hgb: 13.4> 10.4> 9.5>9.2 - hemoccult + on 9/18  > started BID  Protonix -  he has had multiple BMs and none have had any gross blood - continuing to follow Hgb daily -   - daily hemoccults-negative yesterday.  It was +3 days ago.  Hyponatremia - follow- likely due to HCTZ which is on hold since 9/14 - improved to 134 today  Hypertension  - BP has been low to normal -holding lisinopril/HCTZ in the perioperative period -continue as needed hydralazine  Hypomagnesemia - has been replaced     Type 2 diabetes mellitus with peripheral neuropathy - he is no on medications at home -Continue sliding scale NovoLog and gabapentin - Hemoglobin A1C    Component Value Date/Time   HGBA1C 5.5 10/06/2020 0537      Nicotine dependence, cigarettes, uncomplicated -Has been counseled on quitting smoking     Time spent in minutes: 32 DVT prophylaxis: heparin Code Status: Full code Family Communication:  Level of Care: Level of care: Progressive Disposition Plan:  Status is: Inpatient  Remains inpatient appropriate because:Inpatient level of care appropriate due to severity of illness  Dispo: The patient is from: Home              Anticipated d/c is to: Home              Patient currently is medically stable.   Difficult to place patient No  Consultants:  Vascular surgery Cardiology  Procedures:  Nuclear stress test Aortogram with bilateral lower extremity runoff Antimicrobials:  Anti-infectives (From admission, onward)    Start     Dose/Rate Route Frequency Ordered Stop   10/09/20 1345  ceFAZolin (ANCEF) IVPB 2g/100 mL premix  2 g 200 mL/hr over 30 Minutes Intravenous Every 8 hours 10/09/20 1333 10/09/20 2333   10/09/20 0600  ceFAZolin (ANCEF) IVPB 1 g/50 mL premix        1 g 100 mL/hr over 30 Minutes Intravenous To Short Stay 10/08/20 0825 10/09/20 0815        Objective: Vitals:   10/14/20 2340 10/15/20 0316 10/15/20 0732 10/15/20 1136  BP: 123/89 107/75 108/67 112/70  Pulse: 86 80 73 88  Resp: '18 18 20 16  '$ Temp: 98.5 F  (36.9 C) 98.6 F (37 C) 97.7 F (36.5 C)   TempSrc: Oral Oral Oral   SpO2: 98% 98% 99% 98%  Weight:      Height:        Intake/Output Summary (Last 24 hours) at 10/15/2020 1234 Last data filed at 10/15/2020 1100 Gross per 24 hour  Intake 960 ml  Output 900 ml  Net 60 ml    Filed Weights   10/05/20 1641  Weight: 46 kg    Examination: General.  Frail gentleman, in no acute distress. Pulmonary.  Lungs clear bilaterally, normal respiratory effort. CV.  Regular rate and rhythm, no JVD, rub or murmur. Abdomen.  Soft, nontender, nondistended, BS positive. CNS.  Alert and oriented .  No focal neurologic deficit. Extremities.  No edema, no cyanosis, pulses intact and symmetrical. Psychiatry.  Judgment and insight appears normal.    Data Reviewed: I have personally reviewed following labs and imaging studies  CBC: Recent Labs  Lab 10/11/20 0123 10/12/20 0103 10/13/20 0124 10/14/20 0150 10/15/20 0139  WBC 8.1 7.5 5.9 5.2 5.5  HGB 9.1* 9.2* 9.5* 9.0* 9.2*  HCT 27.8* 27.6* 28.3* 27.0* 27.2*  MCV 89.4 88.7 89.0 87.4 88.3  PLT 229 272 341 371 412*    Basic Metabolic Panel: Recent Labs  Lab 10/09/20 1216 10/10/20 0316 10/11/20 0123 10/12/20 0103 10/13/20 0124 10/14/20 0150 10/15/20 0139  NA 131* 130* 130* 134* 133* 132* 134*  K 3.5 4.2 4.2 3.7 3.4* 3.9 4.2  CL 98 99 98 100 98 101 103  CO2 '24 23 25 23 23 22 24  '$ GLUCOSE 189* 125* 116* 92 101* 102* 94  BUN '9 9 8 '$ 7* 10 7* 8  CREATININE 0.91 0.89 0.82 0.84 0.85 0.83 0.86  CALCIUM 8.5* 8.9 9.0 9.0 9.0 9.1 9.3  MG 1.5* 2.1  --   --   --   --   --     GFR: Estimated Creatinine Clearance: 58.7 mL/min (by C-G formula based on SCr of 0.86 mg/dL). Liver Function Tests: Recent Labs  Lab 10/10/20 0316  AST 18  ALT 13  ALKPHOS 50  BILITOT 0.5  PROT 6.1*  ALBUMIN 3.5    Recent Labs  Lab 10/10/20 0316  AMYLASE 60    No results for input(s): AMMONIA in the last 168 hours. Coagulation Profile: Recent Labs  Lab  10/09/20 1216  INR 1.1    Cardiac Enzymes: No results for input(s): CKTOTAL, CKMB, CKMBINDEX, TROPONINI in the last 168 hours. BNP (last 3 results) No results for input(s): PROBNP in the last 8760 hours. HbA1C: No results for input(s): HGBA1C in the last 72 hours.  CBG: Recent Labs  Lab 10/14/20 1111 10/14/20 1605 10/14/20 1936 10/15/20 0512 10/15/20 0639  GLUCAP 104* 113* 160* 107* 101*    Lipid Profile: No results for input(s): CHOL, HDL, LDLCALC, TRIG, CHOLHDL, LDLDIRECT in the last 72 hours.  Thyroid Function Tests: No results for input(s): TSH, T4TOTAL, FREET4, T3FREE, THYROIDAB in  the last 72 hours. Anemia Panel: No results for input(s): VITAMINB12, FOLATE, FERRITIN, TIBC, IRON, RETICCTPCT in the last 72 hours. Urine analysis:    Component Value Date/Time   COLORURINE STRAW (A) 05/10/2020 1722   APPEARANCEUR CLEAR 05/10/2020 1722   LABSPEC 1.008 05/10/2020 1722   PHURINE 8.0 05/10/2020 1722   GLUCOSEU NEGATIVE 05/10/2020 1722   HGBUR NEGATIVE 05/10/2020 1722   BILIRUBINUR NEGATIVE 05/10/2020 1722   KETONESUR 5 (A) 05/10/2020 1722   PROTEINUR NEGATIVE 05/10/2020 1722   NITRITE NEGATIVE 05/10/2020 1722   LEUKOCYTESUR NEGATIVE 05/10/2020 1722   Sepsis Labs: '@LABRCNTIP'$ (procalcitonin:4,lacticidven:4) ) Recent Results (from the past 240 hour(s))  Blood culture (routine x 2)     Status: None   Collection Time: 10/05/20  5:13 PM   Specimen: BLOOD  Result Value Ref Range Status   Specimen Description   Final    BLOOD LEFT ANTECUBITAL Performed at [facility], [place] [address] [place], [place] [address]    Special Requests   Final    BOTTLES DRAWN AEROBIC AND ANAEROBIC Blood Culture adequate volume Performed at [place] [address] [place], [place] [address]    Culture   Final    NO GROWTH 5 DAYS Performed at [facility], [place] [address], [place], [place] [address]    Report Status 10/10/2020 FINAL   Final  Blood culture (routine x 2)     Status: None   Collection Time: 10/05/20  8:01 PM   Specimen: BLOOD  Result Value Ref Range Status   Specimen Description   Final    BLOOD LEFT ANTECUBITAL Performed at [place] [address], [place], [place] [address]    Special Requests   Final    BOTTLES DRAWN AEROBIC AND ANAEROBIC Blood Culture adequate volume Performed at [place] [address], [place], [place] [address]    Culture   Final    NO GROWTH 5 DAYS Performed at [facility], [place] [address] [place], [place] [address]    Report Status 10/11/2020 FINAL  Final  Resp Panel by RT-PCR (Flu A&B, Covid) Nasopharyngeal Swab     Status: None   Collection Time: 10/05/20  8:04 PM   Specimen: Nasopharyngeal Swab; Nasopharyngeal(NP) swabs in vial transport medium  Result Value Ref Range Status   SARS Coronavirus 2 by RT PCR NEGATIVE NEGATIVE Final    Comment: (NOTE) SARS-CoV-2 target nucleic acids are NOT DETECTED.  The SARS-CoV-2 RNA is generally detectable in upper respiratory specimens during the acute phase of infection. The lowest concentration of SARS-CoV-2 viral copies this assay can detect is 138 copies/mL. A negative result does not preclude SARS-Cov-2 infection and should not be used as the sole basis for treatment or other patient management decisions. A negative result may occur with  improper specimen collection/handling, submission of specimen other than nasopharyngeal swab, presence of viral mutation(s) within the areas targeted by this assay, and inadequate number of viral copies(<138 copies/mL). A negative result must be combined with clinical observations, patient history, and epidemiological information. The expected result is Negative.  Fact Sheet for Patients:  EntrepreneurPulse.com.au  Fact Sheet for Healthcare Providers:  IncredibleEmployment.be  This test is no t yet  approved or cleared by the [place] FDA and  has been authorized for detection and/or diagnosis of SARS-CoV-2 by FDA under an Emergency Use Authorization (EUA). This EUA will remain  in effect (meaning this test can be used) for the duration  of the COVID-19 declaration under Section 564(b)(1) of the Act, 21 U.S.C.section 360bbb-3(b)(1), unless the authorization is terminated  or revoked sooner.       Influenza A by PCR NEGATIVE NEGATIVE Final   Influenza B by PCR NEGATIVE NEGATIVE Final    Comment: (NOTE) The Xpert Xpress SARS-CoV-2/FLU/RSV plus assay is intended as an aid in the diagnosis of influenza from Nasopharyngeal swab specimens and should not be used as a sole basis for treatment. Nasal washings and aspirates are unacceptable for Xpert Xpress SARS-CoV-2/FLU/RSV testing.  Fact Sheet for Patients: EntrepreneurPulse.com.au  Fact Sheet for Healthcare Providers: IncredibleEmployment.be  This test is not yet approved or cleared by the [place] FDA and has been authorized for detection and/or diagnosis of SARS-CoV-2 by FDA under an Emergency Use Authorization (EUA). This EUA will remain in effect (meaning this test can be used) for the duration of the COVID-19 declaration under Section 564(b)(1) of the Act, 21 U.S.C. section 360bbb-3(b)(1), unless the authorization is terminated or revoked.  Performed at [facility], [place] [address] [place], [place] [address]   Surgical pcr screen     Status: None   Collection Time: 10/08/20  9:05 PM   Specimen: Nasal Mucosa; Nasal Swab  Result Value Ref Range Status   MRSA, PCR NEGATIVE NEGATIVE Final   Staphylococcus aureus NEGATIVE NEGATIVE Final    Comment: (NOTE) The Xpert SA Assay (FDA approved for NASAL specimens in patients 5 years of age and older), is one component of a comprehensive surveillance program. It is not intended to diagnose infection nor to guide or  monitor treatment. Performed at [facility], [place] [address], [place], [place] [address]          Radiology Studies: No results found.    Scheduled Meds:  acetaminophen  650 mg Oral Q6H   aspirin EC  81 mg Oral Daily   bisacodyl  10 mg Oral Daily   docusate sodium  200 mg Oral BID   feeding supplement  237 mL Oral BID BM   heparin  5,000 Units Subcutaneous Q8H   insulin aspart  0-9 Units Subcutaneous TID WC   multivitamin with minerals  1 tablet Oral Daily   pantoprazole  40 mg Oral BID AC   Continuous Infusions:   LOS: 10 days   Lorella Nimrod, MD [facility] Pager: [email or web address] 10/15/2020, 12:34 PM

## 2020-10-17 ENCOUNTER — Telehealth: Payer: Self-pay

## 2020-10-17 ENCOUNTER — Encounter (HOSPITAL_COMMUNITY): Payer: Self-pay

## 2020-10-17 ENCOUNTER — Other Ambulatory Visit: Payer: Self-pay

## 2020-10-17 ENCOUNTER — Emergency Department (HOSPITAL_COMMUNITY)
Admission: EM | Admit: 2020-10-17 | Discharge: 2020-10-18 | Disposition: A | Discharge status: Home / Self Care | Payer: Medicare HMO | Attending: Emergency Medicine | Admitting: Emergency Medicine

## 2020-10-17 ENCOUNTER — Emergency Department (HOSPITAL_COMMUNITY): Payer: Medicare HMO

## 2020-10-17 DIAGNOSIS — Z7982 Long term (current) use of aspirin: Secondary | ICD-10-CM | POA: Diagnosis not present

## 2020-10-17 DIAGNOSIS — R1030 Lower abdominal pain, unspecified: Secondary | ICD-10-CM | POA: Insufficient documentation

## 2020-10-17 DIAGNOSIS — R1084 Generalized abdominal pain: Secondary | ICD-10-CM

## 2020-10-17 DIAGNOSIS — E119 Type 2 diabetes mellitus without complications: Secondary | ICD-10-CM | POA: Diagnosis not present

## 2020-10-17 DIAGNOSIS — I10 Essential (primary) hypertension: Secondary | ICD-10-CM | POA: Diagnosis not present

## 2020-10-17 DIAGNOSIS — F17210 Nicotine dependence, cigarettes, uncomplicated: Secondary | ICD-10-CM | POA: Diagnosis not present

## 2020-10-17 DIAGNOSIS — Z79899 Other long term (current) drug therapy: Secondary | ICD-10-CM | POA: Insufficient documentation

## 2020-10-17 LAB — COMPREHENSIVE METABOLIC PANEL
ALT: 37 U/L (ref 0–44)
AST: 27 U/L (ref 15–41)
Albumin: 4 g/dL (ref 3.5–5.0)
Alkaline Phosphatase: 74 U/L (ref 38–126)
Anion gap: 12 (ref 5–15)
BUN: 17 mg/dL (ref 8–23)
CO2: 25 mmol/L (ref 22–32)
Calcium: 9.9 mg/dL (ref 8.9–10.3)
Chloride: 96 mmol/L — ABNORMAL LOW (ref 98–111)
Creatinine, Ser: 1.12 mg/dL (ref 0.61–1.24)
GFR, Estimated: >60 mL/min (ref >60)
Glucose, Bld: 108 mg/dL — ABNORMAL HIGH (ref 70–99)
Potassium: 3.8 mmol/L (ref 3.5–5.1)
Sodium: 133 mmol/L — ABNORMAL LOW (ref 135–145)
Total Bilirubin: 0.9 mg/dL (ref 0.3–1.2)
Total Protein: 8 g/dL (ref 6.5–8.1)

## 2020-10-17 LAB — CBC WITH DIFFERENTIAL/PLATELET
Abs Immature Granulocytes: 0.04 10*3/uL (ref 0.00–0.07)
Basophils Absolute: 0 10*3/uL (ref 0.0–0.1)
Basophils Relative: 0 %
Eosinophils Absolute: 0.1 10*3/uL (ref 0.0–0.5)
Eosinophils Relative: 1 %
HCT: 33.8 % — ABNORMAL LOW (ref 39.0–52.0)
Hemoglobin: 10.5 g/dL — ABNORMAL LOW (ref 13.0–17.0)
Immature Granulocytes: 1 %
Lymphocytes Relative: 11 %
Lymphs Abs: 0.9 10*3/uL (ref 0.7–4.0)
MCH: 29.2 pg (ref 26.0–34.0)
MCHC: 31.1 g/dL (ref 30.0–36.0)
MCV: 94.2 fL (ref 80.0–100.0)
Monocytes Absolute: 1 10*3/uL (ref 0.1–1.0)
Monocytes Relative: 13 %
Neutro Abs: 6.2 10*3/uL (ref 1.7–7.7)
Neutrophils Relative %: 74 %
Platelets: 599 10*3/uL — ABNORMAL HIGH (ref 150–400)
RBC: 3.59 MIL/uL — ABNORMAL LOW (ref 4.22–5.81)
RDW: 16.8 % — ABNORMAL HIGH (ref 11.5–15.5)
WBC: 8.2 10*3/uL (ref 4.0–10.5)
nRBC: 0 % (ref 0.0–0.2)

## 2020-10-17 LAB — LACTIC ACID, PLASMA
Lactic Acid, Venous: 1.7 mmol/L (ref 0.5–1.9)
Lactic Acid, Venous: 1.4 mmol/L (ref 0.5–1.9)

## 2020-10-17 MED ORDER — IOHEXOL 350 MG/ML SOLN
80.0000 mL | Freq: Once | INTRAVENOUS | Status: AC | PRN
  Administered 2020-10-17: 80 mL via INTRAVENOUS

## 2020-10-17 MED ORDER — SODIUM CHLORIDE 0.9 % IV BOLUS
500.0000 mL | Freq: Once | INTRAVENOUS | Status: AC
  Administered 2020-10-17: 500 mL via INTRAVENOUS

## 2020-10-17 NOTE — Telephone Encounter (Signed)
Able to speak to therapist. Patient is having throat and belly pain. Advised him I would let patient know. Able to reach patient this time - he sounds very hoarse. Instructed to go to emergency room for further evaluation. Patient verbalized understanding.

## 2020-10-17 NOTE — ED Notes (Signed)
CALLED PTAR  

## 2020-10-17 NOTE — ED Triage Notes (Signed)
Pt reports abd pain and dysphagia since coming home from the hospital 2 days ago, pt had a recent abd vascular surgery. States he cannot eat anything and his urine has been dark.

## 2020-10-17 NOTE — Discharge Instructions (Addendum)
As discussed, your evaluation today has been largely reassuring.  But, it is important that you monitor your condition carefully, and do not hesitate to return to the ED if you develop new, or concerning changes in your condition. ? ?Otherwise, please follow-up with your physician for appropriate ongoing care. ? ?

## 2020-10-17 NOTE — ED Notes (Signed)
Pt noticed blood oozing from one of his abdominal incision sites on the left side, gauze applied.

## 2020-10-17 NOTE — ED Provider Notes (Signed)
[facility] EMERGENCY DEPARTMENT Provider Note   CSN: [id number] Arrival date & time: 10/17/20  1500     History Chief Complaint  Patient presents with   Abdominal Pain   Dysphagia    [name]. is a 60 y.o. male.  HPI [name]. 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   The patient presents today 2 days after being discharged from the facility after recent aortobifemoral bypass.  He notes that since going home he has had discomfort in the lower abdomen, none in the upper.  He has been able to eat and drink, though he notes postprandial pain.  He has history of bowel movements.  With worsening lower abdominal pain, severe, not improved with anything he presents for evaluation.  No new numbness or weakness in his legs.      Past Medical History:  Diagnosis Date   Diabetes mellitus without complication ([place])    Hyperlipidemia    Hypertension    Peripheral vascular disease ([place])    Tobacco use     Patient Active Problem List   Diagnosis Date Noted   Nicotine dependence, cigarettes, uncomplicated 69/79/4801   Mixed hyperlipidemia 10/05/2020   Protein-calorie malnutrition, severe 06/07/2020   Osteomyelitis of fifth toe of left foot ([place]) 06/07/2020   Tobacco use    Toe infection 06/06/2020   PVD (peripheral vascular disease) ([place]) 12/19/2019   Claudication in peripheral vascular disease ([place]) 08/16/2019   Critical ischemia of foot ([place]) 07/16/2019   Critical lower limb ischemia ([place]) 07/10/2019   Pure hypercholesterolemia 10/13/2018   Type 2 diabetes mellitus without complication, without long-term current use of insulin ([place]) 10/13/2018   Tobacco use disorder 10/13/2018    Essential hypertension 10/23/2009    Past Surgical History:  Procedure Laterality Date   ABDOMINAL AORTOGRAM W/LOWER EXTREMITY Left 07/16/2019   Procedure: ABDOMINAL AORTOGRAM W/LOWER EXTREMITY;  Surgeon: Lorretta Harp, MD;  Location: [place] [facility];  Service: Cardiovascular;  Laterality: Left;   ABDOMINAL AORTOGRAM W/LOWER EXTREMITY Left 08/16/2019   ABDOMINAL AORTOGRAM W/LOWER EXTREMITY N/A 08/16/2019   Procedure: ABDOMINAL AORTOGRAM W/LOWER EXTREMITY;  Surgeon: Lorretta Harp, MD;  Location: [place] [facility];  Service: Cardiovascular;  Laterality: N/A;   ABDOMINAL AORTOGRAM W/LOWER EXTREMITY Bilateral 12/19/2019   Procedure: ABDOMINAL AORTOGRAM W/LOWER EXTREMITY;  Surgeon: Wellington Hampshire, MD;  Location: [place] [facility];  Service: Cardiovascular;  Laterality: Bilateral;   ABDOMINAL AORTOGRAM W/LOWER EXTREMITY Left 06/10/2020   Procedure: ABDOMINAL AORTOGRAM W/LOWER EXTREMITY;  Surgeon: Serafina Mitchell, MD;  Location: [place] [facility];  Service: Cardiovascular;  Laterality: Left;   ABDOMINAL AORTOGRAM W/LOWER EXTREMITY N/A 10/06/2020   Procedure: ABDOMINAL AORTOGRAM W/LOWER EXTREMITY;  Surgeon: Waynetta Sandy, MD;  Location: [place] [facility];  Service: Cardiovascular;  Laterality: N/A;   AMPUTATION TOE Left 06/13/2020   Procedure: Left Fifth Toe Amputation ;  Surgeon: Criselda Peaches, DPM;  Location: [place];  Service: Podiatry;  Laterality: Left;   AORTA - BILATERAL FEMORAL ARTERY BYPASS GRAFT Bilateral 10/09/2020   Procedure: AORTA BIFEMORAL BYPASS GRAFT;  Surgeon: Cherre Robins, MD;  [facility];  Service: Vascular;  Laterality: Bilateral;   arm surgery     ENDARTERECTOMY FEMORAL Bilateral 10/09/2020   Procedure: ENDARTERECTOMY COMMON FEMORAL;  Surgeon: Cherre Robins, MD;  Location: [place];  Service: Vascular;  Laterality: Bilateral;   PERIPHERAL VASCULAR BALLOON ANGIOPLASTY  08/16/2019   Procedure: PERIPHERAL VASCULAR BALLOON ANGIOPLASTY;  Surgeon:  Lorretta Harp, MD;  Location: [place] [facility];  Service: Cardiovascular;;  attempted PTA of Left SFA   PERIPHERAL VASCULAR INTERVENTION Right 07/16/2019   Procedure: PERIPHERAL VASCULAR INTERVENTION;  Surgeon: Lorretta Harp, MD;  Location: [place] [facility];  Service: Cardiovascular;  Laterality: Right;   PERIPHERAL VASCULAR INTERVENTION Right 12/19/2019   Procedure: PERIPHERAL VASCULAR INTERVENTION;  Surgeon: Wellington Hampshire, MD;  Location: [place] [facility];  Service: Cardiovascular;  Laterality: Right;  iliac   PERIPHERAL VASCULAR INTERVENTION Left 06/10/2020   Procedure: PERIPHERAL VASCULAR INTERVENTION;  Surgeon: Serafina Mitchell, MD;  Location: [place] [facility];  Service: Cardiovascular;  Laterality: Left;  SFA /COMMON ILIAC       Family History  Problem Relation Age of [place]   Cancer Mother    Diabetes Father     Social History   Tobacco Use   Smoking status: Every Day    Packs/day: 0.50    Years: 40.00    Pack years: 20.00    Types: Cigarettes   Smokeless tobacco: Never  Vaping Use   Vaping Use: Never used  Substance Use Topics   Alcohol use: Yes    Comment: beer and liquor daily   Drug use: Yes    Frequency: 3.0 times per week    Types: Marijuana    Home Medications Prior to Admission medications   Medication Sig Start Date End Date Taking? Authorizing Provider  acetaminophen (TYLENOL) 325 MG tablet Take 2 tablets (650 mg total) by mouth every 6 (six) hours as needed. 10/14/20   Debbe Odea, MD  aspirin EC 81 MG EC tablet Take 1 tablet (81 mg total) by mouth daily. Swallow whole. Patient taking differently: Take 81 mg by mouth daily at 2 PM. Swallow whole. 07/18/19   Furth, Cadence H, PA-C  atorvastatin (LIPITOR) 40 MG tablet Take 40 mg by mouth daily. 04/06/20   [provider]  Blood Pressure Monitor KIT Check blood pressure daily Patient taking differently: 1 each by Other route once a week. 10/20/18   Jacelyn Pi, Lilia Argue, MD  docusate  sodium (COLACE) 100 MG capsule Take 1 capsule (100 mg total) by mouth 2 (two) times daily. Patient not taking: No sig reported 06/16/20   Lavina Hamman, MD  feeding supplement (ENSURE ENLIVE / ENSURE PLUS) LIQD Take 237 mLs by mouth 2 (two) times daily between meals. 10/14/20   Debbe Odea, MD  gabapentin (NEURONTIN) 600 MG tablet Take 1 tablet (600 mg) in morning and afternoon. Take 2 tablets (1200 mg) at night Patient taking differently: Take 600 mg by mouth See admin instructions. Take 3 tablet (1800 mg) in morning and  2 tablets (1200 mg) at night 06/16/20   Lavina Hamman, MD  lisinopril-hydrochlorothiazide (ZESTORETIC) 20-25 MG tablet Take 1 tablet by mouth daily. 04/16/20   [provider]  Multiple Vitamin (MULTIVITAMIN WITH MINERALS) TABS tablet Take 1 tablet by mouth daily. 10/14/20   Debbe Odea, MD  nicotine (NICODERM CQ - DOSED IN MG/24 HR) 7 mg/24hr patch Place 1 patch (7 mg total) onto the skin daily. Patient not taking: No sig reported 06/17/20   Lavina Hamman, MD  pantoprazole (PROTONIX) 40 MG tablet Take 1 tablet (40 mg total) by mouth daily. 10/14/20   Debbe Odea, MD  SANTYL ointment Apply 1 application topically 2 (two) times daily. 08/15/19   [provider]  traMADol (ULTRAM) 50 MG tablet Take 1 tablet (50 mg total) by mouth every 6 (six) hours as needed for moderate pain. 10/15/20   Dagoberto Ligas, PA-C    Allergies    Patient has no known allergies.  Review of Systems   Review of Systems  Constitutional:        Per HPI, otherwise negative  HENT:         Per HPI, otherwise negative  Respiratory:         Per HPI, otherwise negative  Cardiovascular:        Per HPI, otherwise negative  Gastrointestinal:  Negative for vomiting.  Endocrine:       Negative aside from HPI  Genitourinary:        Neg aside from HPI   Musculoskeletal:        Per HPI, otherwise negative  Skin: Negative.   Neurological:  Negative for syncope.   Physical  Exam Updated Vital Signs BP 135/85   Pulse (!) 103   Temp 99.2 F (37.3 C) (Oral)   Resp 20   SpO2 97%   Physical Exam Vitals and nursing note reviewed.  Constitutional:      General: He is not in acute distress.    Appearance: He is well-developed.  HENT:     Head: Normocephalic and atraumatic.  Eyes:     Conjunctiva/sclera: Conjunctivae normal.  Cardiovascular:     Rate and Rhythm: Normal rate and regular rhythm.     Comments: Pulses appreciable dorsalis pedis, 1+ Pulmonary:     Effort: Pulmonary effort is normal. No respiratory distress.     Breath sounds: No stridor.  Abdominal:     General: There is no distension.     Comments: Abdomen Currently, no guarding, no rebound.  Patient is surgical incision in the inferior abdomen, no active bleeding, drainage, discharge.  Skin:    General: Skin is warm and dry.  Neurological:     Mental Status: He is alert and oriented to person, place, and time.    ED Results / Procedures / Treatments   Labs (all labs ordered are listed, but only abnormal results are displayed) Labs Reviewed  CBC WITH DIFFERENTIAL/PLATELET - Abnormal; Notable for the following components:      Result Value   RBC 3.59 (*)    Hemoglobin 10.5 (*)    HCT 33.8 (*)    RDW 16.8 (*)    Platelets 599 (*)    All other components within normal limits  COMPREHENSIVE METABOLIC PANEL - Abnormal; Notable for the following components:   Sodium 133 (*)    Chloride 96 (*)    Glucose, Bld 108 (*)    All other components within normal limits  LACTIC ACID, PLASMA  URINALYSIS, ROUTINE W REFLEX MICROSCOPIC  LACTIC ACID, PLASMA    EKG None  Radiology CT Angio Abd/Pel W and/or Wo Contrast  Result Date: 10/17/2020 CLINICAL DATA:  Status post aortobifemoral bypass grafting on 10/09/2020 now with unspecified abdominal pain, anorexia, and vomiting. EXAM: CTA ABDOMEN AND PELVIS WITHOUT AND WITH CONTRAST TECHNIQUE: Multidetector CT imaging of the abdomen and pelvis was  performed using the standard protocol during bolus administration of intravenous contrast. Multiplanar reconstructed images and MIPs were obtained and reviewed to evaluate the vascular anatomy. 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 Surgical changes of aortobifemoral bypass grafting are identified with wide patency of the bypass graft. There is reconstituted flow within the native abdominal aorta best appreciated on delayed phase imaging with subsequent filling of the stented native common iliac arteries. Celiac: Patent. No hemodynamically significant stenosis, aneurysm, or dissection. 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 Distally, this vessel is patent without aneurysm or dissection. Renals: Single renal arteries bilaterally are widely patent and demonstrate normal vascular morphology. No aneurysm. 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 Inflow: As noted above, bilateral femoral limbs of the aortobifemoral bypass graft are patent. The heavily diseased native external iliac arteries are patent and are opacified on delayed phase images. Internal iliac arteries are heavily diseased, but are patent bilaterally. Proximal Outflow: Left femoral-distal bypass graft is occluded at its origin, unchanged from prior examination. Left profundus femoral artery is patent. Right superficial femoral artery and profundus femoral artery are patent. Veins: Left retroperitoneal hematoma surrounding the left limb of the aortobifemoral bypass graft demonstrates moderate mass effect upon the left external iliac vein, best  appreciated on image # 62/11. This vessel is still patent, however. The abdominal venous vasculature is otherwise unremarkable. Review of the MIP images confirms the above findings. NON-VASCULAR Lower chest: No acute abnormality Hepatobiliary: Mild hepatic steatosis. Liver otherwise unremarkable. Gallbladder unremarkable Pancreas: Unremarkable Spleen: Unremarkable Adrenals/Urinary Tract: Adrenal glands are unremarkable. Kidneys are normal, without renal calculi, focal lesion, or hydronephrosis. Bladder is unremarkable. Stomach/Bowel: Stomach is within normal limits. Appendix appears normal. No evidence of bowel wall thickening, distention, or inflammatory changes. No free intraperitoneal gas or fluid. Lymphatic: No pathologic adenopathy within the abdomen and pelvis. Reproductive: Mild prostatic enlargement is unchanged. Other: A a 3.6 x 5.0 cm hematoma surrounds the left limb of the aortobifemoral bypass graft, best seen on image # 59/11. There is small hematoma noted within the inguinal regions bilaterally surrounding the femoral anastomosis of the bypass grafts measuring up to 14 mm on the right and 20 mm on the left. There is no active extravasation identified within these regions. A a subcutaneous hematoma is noted within the left inguinal region measuring 22 mm. Musculoskeletal: Degenerative changes seen within the lumbar spine. No acute bone abnormality. IMPRESSION: VASCULAR Status post aortobifemoral bypass grafting with wide patency of the aortobifemoral bypass graft. 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 No frank bowel ischemia noted on this  examination. Moderate mass effect upon the left common iliac vein secondary to retroperitoneal hematoma with preserved patency of this vessel. NON-VASCULAR Hematoma within the left retroperitoneum surrounding the left limb of the aortobifemoral bypass graft demonstrating moderate mass effect upon the left common iliac vein described above as well as small hematoma surrounding the femoral anastomosis of the bypass graft and within the subcutaneous soft tissues of the left inguinal region. No active extravasation identified. Mild hepatic steatosis. 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 Electronically Signed   By: Fidela Salisbury M.D.   On:  10/17/2020 20:03    Procedures Procedures   Medications Ordered in ED Medications  iohexol (OMNIPAQUE) 350 MG/ML injection 80 mL (80 mLs Intravenous Contrast Given 10/17/20 1918)    ED Course  I have reviewed the triage vital signs and the nursing notes.  Pertinent labs & imaging results that were available during my care of the patient were reviewed by me and considered in my medical decision making (see chart for details).   10:26 PM Patient in similar condition.  Had reviewed his CT scan, discussed with our vascular team.  Vasculature appears patent, and preserved distal pulses are further reassurance.  Have advised patient to have retroperitoneal hematoma.     11:21 PM Patient awake, alert, watching television, no distress.  He has eaten applesauce, with no complication.  Heart rate 90, blood pressure appropriate.  Discussed his case with her vascular surgeon, who is seen and evaluated patient.  We reviewed the CT imaging, no indication for admission for vascular surgery intervention.  Given patient's just demonstrated capacity to eat and drink, no indication for admission, no evidence for substantial dehydration.  He and I discussed advancing diet slowly, smoothies, yogurt etc. initially.  Patient is  amenable to this.  Absent evidence for new occlusion, infection, patient discharged in stable condition with scheduled follow-up next week with vascular surgery.   MDM Rules/Calculators/A&P MDM Number of Diagnoses or Management Options Generalized abdominal pain: new, needed workup   Amount and/or Complexity of Data Reviewed Clinical lab tests: ordered and reviewed Tests in the radiology section of CPT: ordered and reviewed Tests in the medicine section of CPT: reviewed and ordered Decide to obtain previous medical records or to obtain history from someone other than the patient: yes Review and summarize past medical records: yes Discuss the patient with other providers: yes Independent visualization of images, tracings, or specimens: yes  Risk of Complications, Morbidity, and/or Mortality Presenting problems: high Diagnostic procedures: high Management options: high  Critical Care Total time providing critical care: < 30 minutes  Patient Progress Patient progress: stable   Final Clinical Impression(s) / ED Diagnoses Final diagnoses:  Generalized abdominal pain     Carmin Muskrat, MD 10/17/20 2326

## 2020-10-17 NOTE — Consult Note (Signed)
Hospital Consult    Reason for Consult:  abdominal pain Referring Physician:  Dr. Vanita Panda MRN #:  [id number]  History of Present Illness This is a 60 y.o. male 8 days postop from aortobifemoral bypass for left lower extremity rest pain.  He was actually discharged just 2 days ago.  He now returns with abdominal pain that he rates as 8-1/2 out of 10.  He also has bilateral groin pain.  He states that the pain has been pretty steady since discharge.  He denies any fevers or chills.  States that he did have some drainage from the left groin incision.  Past Medical History:  Diagnosis Date   Diabetes mellitus without complication ([place])    Hyperlipidemia    Hypertension    Peripheral vascular disease ([place])    Tobacco use     Past Surgical History:  Procedure Laterality Date   ABDOMINAL AORTOGRAM W/LOWER EXTREMITY Left 07/16/2019   Procedure: ABDOMINAL AORTOGRAM W/LOWER EXTREMITY;  Surgeon: Lorretta Harp, MD;  Location: [facility];  Service: Cardiovascular;  Laterality: Left;   ABDOMINAL AORTOGRAM W/LOWER EXTREMITY Left 08/16/2019   ABDOMINAL AORTOGRAM W/LOWER EXTREMITY N/A 08/16/2019   Procedure: ABDOMINAL AORTOGRAM W/LOWER EXTREMITY;  Surgeon: Lorretta Harp, MD;  Location: [facility];  Service: Cardiovascular;  Laterality: N/A;   ABDOMINAL AORTOGRAM W/LOWER EXTREMITY Bilateral 12/19/2019   Procedure: ABDOMINAL AORTOGRAM W/LOWER EXTREMITY;  Surgeon: Wellington Hampshire, MD;  Location: [facility];  Service: Cardiovascular;  Laterality: Bilateral;   ABDOMINAL AORTOGRAM W/LOWER EXTREMITY Left 06/10/2020   Procedure: ABDOMINAL AORTOGRAM W/LOWER EXTREMITY;  Surgeon: Serafina Mitchell, MD;  Location: [facility];  Service: Cardiovascular;  Laterality: Left;   ABDOMINAL AORTOGRAM W/LOWER EXTREMITY N/A 10/06/2020   Procedure: ABDOMINAL AORTOGRAM W/LOWER EXTREMITY;  Surgeon: Waynetta Sandy, MD;  Location: [facility];  Service: Cardiovascular;   Laterality: N/A;   AMPUTATION TOE Left 06/13/2020   Procedure: Left Fifth Toe Amputation ;  Surgeon: Criselda Peaches, DPM;  Location: [facility];  Service: Podiatry;  Laterality: Left;   AORTA - BILATERAL FEMORAL ARTERY BYPASS GRAFT Bilateral 10/09/2020   Procedure: AORTA BIFEMORAL BYPASS GRAFT;  Surgeon: Cherre Robins, MD;  Location: [facility];  Service: Vascular;  Laterality: Bilateral;   arm surgery     ENDARTERECTOMY FEMORAL Bilateral 10/09/2020   Procedure: ENDARTERECTOMY COMMON FEMORAL;  Surgeon: Cherre Robins, MD;  Location: [facility];  Service: Vascular;  Laterality: Bilateral;   PERIPHERAL VASCULAR BALLOON ANGIOPLASTY  08/16/2019   Procedure: PERIPHERAL VASCULAR BALLOON ANGIOPLASTY;  Surgeon: Lorretta Harp, MD;  Location: [facility];  Service: Cardiovascular;;  attempted PTA of Left SFA   PERIPHERAL VASCULAR INTERVENTION Right 07/16/2019   Procedure: PERIPHERAL VASCULAR INTERVENTION;  Surgeon: Lorretta Harp, MD;  Location: [facility];  Service: Cardiovascular;  Laterality: Right;   PERIPHERAL VASCULAR INTERVENTION Right 12/19/2019   Procedure: PERIPHERAL VASCULAR INTERVENTION;  Surgeon: Wellington Hampshire, MD;  Location: [facility];  Service: Cardiovascular;  Laterality: Right;  iliac   PERIPHERAL VASCULAR INTERVENTION Left 06/10/2020   Procedure: PERIPHERAL VASCULAR INTERVENTION;  Surgeon: Serafina Mitchell, MD;  Location: [facility];  Service: Cardiovascular;  Laterality: Left;  SFA /COMMON ILIAC    No Known Allergies  Prior to Admission medications   Medication Sig Start Date End Date Taking? Authorizing Provider  acetaminophen (TYLENOL) 325 MG tablet Take 2 tablets (650 mg total) by mouth every 6 (six) hours as needed.  10/14/20   Debbe Odea, MD  aspirin EC 81 MG EC tablet Take 1 tablet (81 mg total) by mouth daily. Swallow whole. Patient taking differently: Take 81 mg by mouth daily at 2 PM. Swallow whole. 07/18/19   Furth, Cadence H, PA-C   atorvastatin (LIPITOR) 40 MG tablet Take 40 mg by mouth daily. 04/06/20   [provider]  Blood Pressure Monitor KIT Check blood pressure daily Patient taking differently: 1 each by Other route once a week. 10/20/18   Jacelyn Pi, Lilia Argue, MD  docusate sodium (COLACE) 100 MG capsule Take 1 capsule (100 mg total) by mouth 2 (two) times daily. Patient not taking: No sig reported 06/16/20   Lavina Hamman, MD  feeding supplement (ENSURE ENLIVE / ENSURE PLUS) LIQD Take 237 mLs by mouth 2 (two) times daily between meals. 10/14/20   Debbe Odea, MD  gabapentin (NEURONTIN) 600 MG tablet Take 1 tablet (600 mg) in morning and afternoon. Take 2 tablets (1200 mg) at night Patient taking differently: Take 600 mg by mouth See admin instructions. Take 3 tablet (1800 mg) in morning and  2 tablets (1200 mg) at night 06/16/20   Lavina Hamman, MD  lisinopril-hydrochlorothiazide (ZESTORETIC) 20-25 MG tablet Take 1 tablet by mouth daily. 04/16/20   [provider]  Multiple Vitamin (MULTIVITAMIN WITH MINERALS) TABS tablet Take 1 tablet by mouth daily. 10/14/20   Debbe Odea, MD  nicotine (NICODERM CQ - DOSED IN MG/24 HR) 7 mg/24hr patch Place 1 patch (7 mg total) onto the skin daily. Patient not taking: No sig reported 06/17/20   Lavina Hamman, MD  pantoprazole (PROTONIX) 40 MG tablet Take 1 tablet (40 mg total) by mouth daily. 10/14/20   Debbe Odea, MD  SANTYL ointment Apply 1 application topically 2 (two) times daily. 08/15/19   [provider]  traMADol (ULTRAM) 50 MG tablet Take 1 tablet (50 mg total) by mouth every 6 (six) hours as needed for moderate pain. 10/15/20   Dagoberto Ligas, PA-C    Social History   Socioeconomic History   Marital status: Legally Separated    Spouse name: Not on file   Number of children: Not on file   Years of education: Not on file   Highest education level: Not on file  Occupational History   Not on file  Tobacco Use   Smoking status: Every  Day    Packs/day: 0.50    Years: 40.00    Pack years: 20.00    Types: Cigarettes   Smokeless tobacco: Never  Vaping Use   Vaping Use: Never used  Substance and Sexual Activity   Alcohol use: Yes    Comment: beer and liquor daily   Drug use: Yes    Frequency: 3.0 times per week    Types: Marijuana   Sexual activity: Not on file  Other Topics Concern   Not on file  Social History Narrative   Not on file   Social Determinants of Health   Financial Resource Strain: Not on file  Food Insecurity: Not on file  Transportation Needs: Not on file  Physical Activity: Not on file  Stress: Not on file  Social Connections: Not on file  Intimate Partner Violence: Not on file   Family History  Problem Relation Age of Onset   Cancer Mother    Diabetes Father     Review of Systems  Constitutional:  Positive for malaise/fatigue. Negative for fever.  HENT: Negative.    Respiratory: Negative.  Cardiovascular: Negative.   Gastrointestinal:  Positive for abdominal pain.  Musculoskeletal:        Bilateral groin pain Drainage left groin  Skin: Negative.   Neurological: Negative.   Psychiatric/Behavioral: Negative.       Physical Examination  Vitals:   10/17/20 1815 10/17/20 1830  BP: 140/74 124/78  Pulse: (!) 107 99  Resp:    Temp:    SpO2: 98% 99%   There is no height or weight on file to calculate BMI.  Physical Exam Constitutional:      Comments: Cachectic  Eyes:     Extraocular Movements: Extraocular movements intact.  Cardiovascular:     Rate and Rhythm: Normal rate.     Pulses:          Femoral pulses are 2+ on the right side and 2+ on the left side. Abdominal:     General: Abdomen is flat.     Palpations: Abdomen is soft.     Comments: Midline incision clean dry intact  Musculoskeletal:     Cervical back: Normal range of motion.     Comments: There are bilateral groin incisions, there is a small hematoma in the left groin I cannot appreciate any drainage   Neurological:     General: No focal deficit present.     Mental Status: He is alert.  Psychiatric:        Mood and Affect: Mood normal.        Behavior: Behavior normal.        Thought Content: Thought content normal.        Judgment: Judgment normal.     CBC    Component Value Date/Time   WBC 8.2 10/17/2020 1511   RBC 3.59 (L) 10/17/2020 1511   HGB 10.5 (L) 10/17/2020 1511   HGB 11.7 (L) 12/12/2019 1006   HCT 33.8 (L) 10/17/2020 1511   HCT 36.2 (L) 12/12/2019 1006   PLT 599 (H) 10/17/2020 1511   PLT 249 12/12/2019 1006   MCV 94.2 10/17/2020 1511   MCV 90 12/12/2019 1006   MCH 29.2 10/17/2020 1511   MCHC 31.1 10/17/2020 1511   RDW 16.8 (H) 10/17/2020 1511   RDW 14.5 12/12/2019 1006   LYMPHSABS 0.9 10/17/2020 1511   LYMPHSABS 1.5 07/31/2019 1401   MONOABS 1.0 10/17/2020 1511   EOSABS 0.1 10/17/2020 1511   EOSABS 0.2 07/31/2019 1401   BASOSABS 0.0 10/17/2020 1511   BASOSABS 0.0 07/31/2019 1401    BMET    Component Value Date/Time   NA 133 (L) 10/17/2020 1511   NA 136 12/12/2019 1006   K 3.8 10/17/2020 1511   CL 96 (L) 10/17/2020 1511   CO2 25 10/17/2020 1511   GLUCOSE 108 (H) 10/17/2020 1511   BUN 17 10/17/2020 1511   BUN 18 12/12/2019 1006   CREATININE 1.12 10/17/2020 1511   CALCIUM 9.9 10/17/2020 1511   GFRNONAA >60 10/17/2020 1511   GFRAA 82 12/12/2019 1006    COAGS: Lab Results  Component Value Date   INR 1.1 10/09/2020   INR 1.1 10/05/2020   INR 1.0 06/06/2020     Non-Invasive Vascular Imaging:    CT IMPRESSION: VASCULAR   Status post aortobifemoral bypass grafting with wide patency of the aortobifemoral bypass graft. 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 The findings can support  the diagnosis of chronic mesenteric ischemia in the appropriate clinical setting. No frank bowel ischemia noted on this examination.   Moderate mass effect upon the left common iliac vein secondary to retroperitoneal hematoma with preserved patency of this vessel.   NON-VASCULAR   Hematoma within the left retroperitoneum surrounding the left limb of the aortobifemoral bypass graft demonstrating moderate mass effect upon the left common iliac vein described above as well as small hematoma surrounding the femoral anastomosis of the bypass graft and within the subcutaneous soft tissues of the left inguinal region. No active extravasation identified.   Mild hepatic steatosis.   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   ASSESSMENT/PLAN: This is a 60 y.o. male status post aortobifemoral bypass now with mild dehydration and pain in his abdomen and groins appears to be related to his incisions.  There are small hematomas around the graft in the right retroperitoneum this probably communicates down to the left groin where there is a small hematoma as well.  I would not recommend any surgical drainage at this time I cannot appreciate any drainage there does not appear to have any erythema or warmth no sign of infection.  From a vascular standpoint he is okay for discharge and can keep his regularly scheduled follow-up appointment in the office.  Chrystal Zeimet C. Donzetta Matters, MD [facility] Office: [phone] Pager: [phone]

## 2020-10-17 NOTE — Telephone Encounter (Signed)
[facility] physical therapist Clair Gulling called and left a message that patient is "not doing well." Patient is s/p aortobifem on 10/09/2020. Per voice message patient has had very little to eat or drink over the past 2 days - everything causes his stomach to hurt 7-8/10 on the pain scale. His BP is around 130/80, heart rate variable from 80s to 120s. Patient has dizziness and is producing very dark urine. Says patient has nothing over his surgical incisions and that there is a small open area on the abdomen that has a white spot. Based on message -PA concurs that patient probably needs to report to ED. Have left VM with PT and patient to return call.

## 2020-10-17 NOTE — ED Provider Notes (Signed)
Emergency Medicine Provider Triage Evaluation Note  Ruben Denbo Lennie Hummer. , a 60 y.o. male  was evaluated in triage.  Pt complains of nominal pain, poor p.o. intake, vomiting after having a aorta bifemoral bypass graft on 9/15 with Dr. Stanford Breed.  She was seen by his physical therapist today and was recommended to come to the ER.  He denies any chest pain, shortness of breath.  No syncope.  Also complains of left toe pain, states he had this amputated about 5 months ago. Review of Systems  Positive: As above Negative: As above   Physical Exam  BP (!) 142/99 (BP Location: Right Arm)   Pulse (!) 129   Temp 99.2 F (37.3 C) (Oral)   Resp 16   SpO2 100%  Gen:   Awake, no distress   Resp:  Normal effort  MSK:   Moves extremities without difficulty  Other:  Abdominal incision without evidence of dehiscence, no signs of erythema, drainage.  Generalized abdominal tenderness.  Evidence of infection to the left toe.  Medical Decision Making  Medically screening exam initiated at 3:10 PM.  Appropriate orders placed.  [employer]. was informed that the remainder of the evaluation will be completed by another provider, this initial triage assessment does not replace that evaluation, and the importance of remaining in the ED until their evaluation is complete.     Garald Balding, PA-C 10/17/20 1517    Carmin Muskrat, MD 10/17/20 [phone]

## 2020-10-18 DIAGNOSIS — R1030 Lower abdominal pain, unspecified: Secondary | ICD-10-CM | POA: Diagnosis not present

## 2020-10-18 NOTE — ED Notes (Signed)
Pts ring and necklace found in bed while cleaning. Given to charge nurse along with address and phone number.

## 2020-10-18 NOTE — ED Notes (Signed)
Pt discharged and wheeled out of the ED on a stretcher with [facility] crew.

## 2020-10-20 ENCOUNTER — Encounter (HOSPITAL_COMMUNITY): Payer: Medicare Other

## 2020-10-20 ENCOUNTER — Ambulatory Visit: Payer: Medicare Other | Admitting: Surgery

## 2020-10-20 ENCOUNTER — Other Ambulatory Visit (HOSPITAL_COMMUNITY): Payer: Medicare Other

## 2020-10-28 ENCOUNTER — Observation Stay (HOSPITAL_COMMUNITY)
Admission: EM | Admit: 2020-10-28 | Discharge: 2020-10-29 | Disposition: A | Discharge status: Home / Self Care | Payer: Medicare Other | Attending: Internal Medicine | Admitting: Internal Medicine

## 2020-10-28 ENCOUNTER — Encounter (HOSPITAL_COMMUNITY): Payer: Self-pay | Admitting: *Deleted

## 2020-10-28 ENCOUNTER — Emergency Department (HOSPITAL_COMMUNITY): Payer: Medicare Other

## 2020-10-28 ENCOUNTER — Other Ambulatory Visit: Payer: Self-pay

## 2020-10-28 DIAGNOSIS — M79672 Pain in left foot: Secondary | ICD-10-CM | POA: Diagnosis present

## 2020-10-28 DIAGNOSIS — F17210 Nicotine dependence, cigarettes, uncomplicated: Secondary | ICD-10-CM | POA: Insufficient documentation

## 2020-10-28 DIAGNOSIS — E119 Type 2 diabetes mellitus without complications: Secondary | ICD-10-CM

## 2020-10-28 DIAGNOSIS — Z20822 Contact with and (suspected) exposure to covid-19: Secondary | ICD-10-CM | POA: Insufficient documentation

## 2020-10-28 DIAGNOSIS — I70322 Atherosclerosis of unspecified type of bypass graft(s) of the extremities with rest pain, left leg: Secondary | ICD-10-CM | POA: Diagnosis not present

## 2020-10-28 DIAGNOSIS — E7800 Pure hypercholesterolemia, unspecified: Secondary | ICD-10-CM | POA: Diagnosis present

## 2020-10-28 DIAGNOSIS — I739 Peripheral vascular disease, unspecified: Secondary | ICD-10-CM

## 2020-10-28 DIAGNOSIS — L089 Local infection of the skin and subcutaneous tissue, unspecified: Secondary | ICD-10-CM

## 2020-10-28 DIAGNOSIS — Z79899 Other long term (current) drug therapy: Secondary | ICD-10-CM | POA: Insufficient documentation

## 2020-10-28 DIAGNOSIS — E43 Unspecified severe protein-calorie malnutrition: Secondary | ICD-10-CM

## 2020-10-28 DIAGNOSIS — M869 Osteomyelitis, unspecified: Secondary | ICD-10-CM

## 2020-10-28 DIAGNOSIS — Z7982 Long term (current) use of aspirin: Secondary | ICD-10-CM | POA: Insufficient documentation

## 2020-10-28 DIAGNOSIS — E782 Mixed hyperlipidemia: Secondary | ICD-10-CM

## 2020-10-28 DIAGNOSIS — M79662 Pain in left lower leg: Secondary | ICD-10-CM

## 2020-10-28 DIAGNOSIS — F17200 Nicotine dependence, unspecified, uncomplicated: Secondary | ICD-10-CM | POA: Diagnosis present

## 2020-10-28 DIAGNOSIS — I10 Essential (primary) hypertension: Secondary | ICD-10-CM | POA: Diagnosis present

## 2020-10-28 DIAGNOSIS — Z720 Tobacco use: Secondary | ICD-10-CM

## 2020-10-28 DIAGNOSIS — I70229 Atherosclerosis of native arteries of extremities with rest pain, unspecified extremity: Secondary | ICD-10-CM

## 2020-10-28 DIAGNOSIS — F1210 Cannabis abuse, uncomplicated: Secondary | ICD-10-CM | POA: Diagnosis present

## 2020-10-28 LAB — COMPREHENSIVE METABOLIC PANEL
ALT: 18 U/L (ref 0–44)
AST: 25 U/L (ref 15–41)
Albumin: 3.6 g/dL (ref 3.5–5.0)
Alkaline Phosphatase: 71 U/L (ref 38–126)
Anion gap: 11 (ref 5–15)
BUN: 10 mg/dL (ref 8–23)
CO2: 29 mmol/L (ref 22–32)
Calcium: 9.5 mg/dL (ref 8.9–10.3)
Chloride: 101 mmol/L (ref 98–111)
Creatinine, Ser: 0.73 mg/dL (ref 0.61–1.24)
GFR, Estimated: >60 mL/min (ref >60)
Glucose, Bld: 118 mg/dL — ABNORMAL HIGH (ref 70–99)
Potassium: 3.8 mmol/L (ref 3.5–5.1)
Sodium: 141 mmol/L (ref 135–145)
Total Bilirubin: 0.6 mg/dL (ref 0.3–1.2)
Total Protein: 7 g/dL (ref 6.5–8.1)

## 2020-10-28 LAB — RESP PANEL BY RT-PCR (FLU A&B, COVID) ARPGX2
Influenza A by PCR: NEGATIVE
Influenza B by PCR: NEGATIVE
SARS Coronavirus 2 by RT PCR: NEGATIVE

## 2020-10-28 LAB — CBC WITH DIFFERENTIAL/PLATELET
Abs Immature Granulocytes: 0.02 10*3/uL (ref 0.00–0.07)
Basophils Absolute: 0 10*3/uL (ref 0.0–0.1)
Basophils Relative: 1 %
Eosinophils Absolute: 0.3 10*3/uL (ref 0.0–0.5)
Eosinophils Relative: 6 %
HCT: 30.6 % — ABNORMAL LOW (ref 39.0–52.0)
Hemoglobin: 9.7 g/dL — ABNORMAL LOW (ref 13.0–17.0)
Immature Granulocytes: 0 %
Lymphocytes Relative: 24 %
Lymphs Abs: 1.4 10*3/uL (ref 0.7–4.0)
MCH: 28.6 pg (ref 26.0–34.0)
MCHC: 31.7 g/dL (ref 30.0–36.0)
MCV: 90.3 fL (ref 80.0–100.0)
Monocytes Absolute: 0.7 10*3/uL (ref 0.1–1.0)
Monocytes Relative: 12 %
Neutro Abs: 3.4 10*3/uL (ref 1.7–7.7)
Neutrophils Relative %: 57 %
Platelets: 501 10*3/uL — ABNORMAL HIGH (ref 150–400)
RBC: 3.39 MIL/uL — ABNORMAL LOW (ref 4.22–5.81)
RDW: 16.9 % — ABNORMAL HIGH (ref 11.5–15.5)
WBC: 5.8 10*3/uL (ref 4.0–10.5)
nRBC: 0 % (ref 0.0–0.2)

## 2020-10-28 LAB — RAPID URINE DRUG SCREEN, HOSP PERFORMED
Amphetamines: NOT DETECTED
Barbiturates: NOT DETECTED
Benzodiazepines: NOT DETECTED
Cocaine: NOT DETECTED
Opiates: NOT DETECTED
Tetrahydrocannabinol: POSITIVE — AB

## 2020-10-28 LAB — PREALBUMIN: Prealbumin: 20.6 mg/dL (ref 18–38)

## 2020-10-28 LAB — C-REACTIVE PROTEIN: CRP: 1.3 mg/dL — ABNORMAL HIGH (ref <1.0)

## 2020-10-28 LAB — SEDIMENTATION RATE: Sed Rate: 50 mm/hr — ABNORMAL HIGH (ref 0–16)

## 2020-10-28 LAB — LACTIC ACID, PLASMA: Lactic Acid, Venous: 1.5 mmol/L (ref 0.5–1.9)

## 2020-10-28 MED ORDER — FENTANYL CITRATE PF 50 MCG/ML IJ SOSY
50.0000 ug | PREFILLED_SYRINGE | Freq: Once | INTRAMUSCULAR | Status: AC
  Administered 2020-10-28: 50 ug via INTRAVENOUS
  Filled 2020-10-28: qty 1

## 2020-10-28 MED ORDER — DOCUSATE SODIUM 100 MG PO CAPS
100.0000 mg | ORAL_CAPSULE | Freq: Two times a day (BID) | ORAL | Status: DC
  Administered 2020-10-29: 100 mg via ORAL
  Filled 2020-10-28 (×3): qty 1

## 2020-10-28 MED ORDER — GABAPENTIN 300 MG PO CAPS
600.0000 mg | ORAL_CAPSULE | Freq: Every day | ORAL | Status: DC
  Administered 2020-10-28 – 2020-10-29 (×2): 600 mg via ORAL
  Filled 2020-10-28 (×2): qty 2

## 2020-10-28 MED ORDER — SODIUM CHLORIDE 0.9 % IV SOLN: INTRAVENOUS | Status: DC

## 2020-10-28 MED ORDER — ASPIRIN EC 81 MG PO TBEC
81.0000 mg | DELAYED_RELEASE_TABLET | Freq: Every day | ORAL | Status: DC
  Administered 2020-10-28 – 2020-10-29 (×2): 81 mg via ORAL
  Filled 2020-10-28 (×2): qty 1

## 2020-10-28 MED ORDER — ACETAMINOPHEN 650 MG RE SUPP: 650.0000 mg | Freq: Four times a day (QID) | RECTAL | Status: DC | PRN

## 2020-10-28 MED ORDER — POLYETHYLENE GLYCOL 3350 17 G PO PACK: 17.0000 g | PACK | Freq: Every day | ORAL | Status: DC | PRN

## 2020-10-28 MED ORDER — ONDANSETRON HCL 4 MG PO TABS: 4.0000 mg | ORAL_TABLET | Freq: Four times a day (QID) | ORAL | Status: DC | PRN

## 2020-10-28 MED ORDER — VANCOMYCIN HCL IN DEXTROSE 1-5 GM/200ML-% IV SOLN
1000.0000 mg | INTRAVENOUS | Status: DC
  Administered 2020-10-29: 1000 mg via INTRAVENOUS
  Filled 2020-10-28 (×2): qty 200

## 2020-10-28 MED ORDER — SODIUM CHLORIDE 0.9 % IV SOLN
2.0000 g | INTRAVENOUS | Status: DC
  Administered 2020-10-28 – 2020-10-29 (×2): 2 g via INTRAVENOUS
  Filled 2020-10-28 (×2): qty 20

## 2020-10-28 MED ORDER — ACETAMINOPHEN 325 MG PO TABS: 650.0000 mg | ORAL_TABLET | Freq: Four times a day (QID) | ORAL | Status: DC | PRN

## 2020-10-28 MED ORDER — BISACODYL 5 MG PO TBEC: 5.0000 mg | DELAYED_RELEASE_TABLET | Freq: Every day | ORAL | Status: DC | PRN

## 2020-10-28 MED ORDER — ONDANSETRON HCL 4 MG/2ML IJ SOLN: 4.0000 mg | Freq: Four times a day (QID) | INTRAMUSCULAR | Status: DC | PRN

## 2020-10-28 MED ORDER — METRONIDAZOLE 500 MG/100ML IV SOLN
500.0000 mg | Freq: Two times a day (BID) | INTRAVENOUS | Status: DC
  Administered 2020-10-28 – 2020-10-29 (×3): 500 mg via INTRAVENOUS
  Filled 2020-10-28 (×3): qty 100

## 2020-10-28 MED ORDER — ATORVASTATIN CALCIUM 40 MG PO TABS
40.0000 mg | ORAL_TABLET | Freq: Every day | ORAL | Status: DC
  Administered 2020-10-28 – 2020-10-29 (×2): 40 mg via ORAL
  Filled 2020-10-28 (×2): qty 1

## 2020-10-28 MED ORDER — SODIUM CHLORIDE 0.9% FLUSH
3.0000 mL | Freq: Two times a day (BID) | INTRAVENOUS | Status: DC
  Administered 2020-10-28: 3 mL via INTRAVENOUS

## 2020-10-28 MED ORDER — HEPARIN (PORCINE) 25000 UT/250ML-% IV SOLN: 12.0000 [IU]/kg/h | INTRAVENOUS | Status: DC

## 2020-10-28 MED ORDER — LISINOPRIL-HYDROCHLOROTHIAZIDE 20-25 MG PO TABS: 1.0000 | ORAL_TABLET | Freq: Every day | ORAL | Status: DC

## 2020-10-28 MED ORDER — LISINOPRIL 10 MG PO TABS
20.0000 mg | ORAL_TABLET | Freq: Every day | ORAL | Status: DC
  Administered 2020-10-28 – 2020-10-29 (×2): 20 mg via ORAL
  Filled 2020-10-28: qty 1
  Filled 2020-10-28: qty 2

## 2020-10-28 MED ORDER — IOHEXOL 350 MG/ML SOLN
100.0000 mL | Freq: Once | INTRAVENOUS | Status: AC | PRN
  Administered 2020-10-28: 100 mL via INTRAVENOUS

## 2020-10-28 MED ORDER — MORPHINE SULFATE (PF) 2 MG/ML IV SOLN
2.0000 mg | INTRAVENOUS | Status: DC | PRN
  Administered 2020-10-28: 2 mg via INTRAVENOUS
  Filled 2020-10-28: qty 1

## 2020-10-28 MED ORDER — HYDROCHLOROTHIAZIDE 25 MG PO TABS
25.0000 mg | ORAL_TABLET | Freq: Every day | ORAL | Status: DC
  Administered 2020-10-28 – 2020-10-29 (×2): 25 mg via ORAL
  Filled 2020-10-28 (×2): qty 1

## 2020-10-28 MED ORDER — PIPERACILLIN-TAZOBACTAM 3.375 G IVPB 30 MIN
3.3750 g | Freq: Once | INTRAVENOUS | Status: AC
  Administered 2020-10-28: 3.375 g via INTRAVENOUS
  Filled 2020-10-28: qty 50

## 2020-10-28 MED ORDER — VANCOMYCIN HCL IN DEXTROSE 1-5 GM/200ML-% IV SOLN
1000.0000 mg | Freq: Once | INTRAVENOUS | Status: AC
  Administered 2020-10-28: 1000 mg via INTRAVENOUS
  Filled 2020-10-28: qty 200

## 2020-10-28 MED ORDER — GABAPENTIN 600 MG PO TABS: 600.0000 mg | ORAL_TABLET | ORAL | Status: DC

## 2020-10-28 MED ORDER — ENOXAPARIN SODIUM 40 MG/0.4ML IJ SOSY: 40.0000 mg | PREFILLED_SYRINGE | INTRAMUSCULAR | Status: DC

## 2020-10-28 MED ORDER — NICOTINE 14 MG/24HR TD PT24
14.0000 mg | MEDICATED_PATCH | Freq: Every day | TRANSDERMAL | Status: DC
  Administered 2020-10-28 – 2020-10-29 (×2): 14 mg via TRANSDERMAL
  Filled 2020-10-28 (×2): qty 1

## 2020-10-28 MED ORDER — SODIUM CHLORIDE 0.9 % IV BOLUS
500.0000 mL | Freq: Once | INTRAVENOUS | Status: AC
  Administered 2020-10-28: 500 mL via INTRAVENOUS

## 2020-10-28 MED ORDER — OXYCODONE-ACETAMINOPHEN 5-325 MG PO TABS: 1.0000 | ORAL_TABLET | Freq: Once | ORAL | Status: DC

## 2020-10-28 MED ORDER — GABAPENTIN 400 MG PO CAPS
1200.0000 mg | ORAL_CAPSULE | Freq: Two times a day (BID) | ORAL | Status: DC
  Administered 2020-10-28 – 2020-10-29 (×2): 1200 mg via ORAL
  Filled 2020-10-28: qty 4
  Filled 2020-10-28: qty 3

## 2020-10-28 MED ORDER — HYDRALAZINE HCL 20 MG/ML IJ SOLN: 5.0000 mg | INTRAMUSCULAR | Status: DC | PRN

## 2020-10-28 MED ORDER — PANTOPRAZOLE SODIUM 40 MG PO TBEC
40.0000 mg | DELAYED_RELEASE_TABLET | Freq: Every day | ORAL | Status: DC
  Administered 2020-10-28 – 2020-10-29 (×2): 40 mg via ORAL
  Filled 2020-10-28 (×3): qty 1

## 2020-10-28 NOTE — ED Notes (Signed)
Pt to CT

## 2020-10-28 NOTE — Progress Notes (Signed)
Pharmacy Antibiotic Note  [name]. is a 60 y.o. male admitted on 10/28/2020 with L leg pain > recent L fifth toe amputation with scab, possible SSTI.  Pharmacy has been consulted for vancomycin dosing.  Plan: Vancomycin '1000mg'$  IV q24h (eAUC 534, Cr rounded to 0.'8mg'$ /dL -Monitor renal function, clinical status, and antibiotic plan -Order vanc levels as appropriate  Weight: 45.9 kg (101 lb 3.1 oz)  Temp (24hrs), Avg:98.5 F (36.9 C), Min:98.5 F (36.9 C), Max:98.5 F (36.9 C)  Recent Labs  Lab 10/28/20 0345  WBC 5.8  CREATININE 0.73    Estimated Creatinine Clearance: 63 mL/min (by C-G formula based on SCr of 0.73 mg/dL).    No Known Allergies  Antimicrobials this admission: Vanc 10/4 >>  Zosyn x1 Ceftriaxone 10/4 >>  Dose adjustments this admission: N/A  Microbiology results: 10/4 BCx:   Thank you for allowing pharmacy to be a part of this patient's care.  Joetta Manners, PharmD, [facility] Emergency Medicine Clinical Pharmacist ED RPh Phone: [place]: [phone]

## 2020-10-28 NOTE — ED Provider Notes (Signed)
     Montine Circle, PA-C 10/28/20 Opal Sidles, April, MD 10/28/20 [id number]

## 2020-10-28 NOTE — Consult Note (Signed)
WOC Nurse Consult Note: Patient receiving care in [facility] ED10 Reason for Consult: LE wound Wound type: healed left fifth toe amputation site with a scab. Patient was evaluated by Vascular Surgeon, C. Donzetta Matters, and no further revascularization planned at this time. Podiatry has also been involved in the care of the patient in recent past. Pressure Injury POA: Yes/No/NA Measurement: Wound bed: Drainage (amount, consistency, odor)  Periwound: Dressing procedure/placement/frequency: Monitor left foot 5th toe amputation site for worsening. Monitor the wound area(s) for worsening of condition such as: Signs/symptoms of infection,  Increase in size,  Development of or worsening of odor, Development of pain, or increased pain at the affected locations.  Notify the medical team if any of these develop.  Thank you for the consult.  [facility] nurse will not follow at this time.  Please re-consult the [facility] team if needed.  Val Riles, RN, MSN, CWOCN, CNS-BC, pager [phone]

## 2020-10-28 NOTE — ED Notes (Signed)
Tray ordered.

## 2020-10-28 NOTE — H&P (Signed)
History and Physical    [name]. SEG:[id number] DOB: 1960-11-20 DOA: 10/28/2020  PCP: Cipriano Mile, NP Consultants:  Dellia Nims - woundTrula Slade - vascular; Gwenlyn Found - cardiology Patient coming from:  Home - lives with a friend; NOK: Launa Grill, [phone]  Chief Complaint: L leg pain  HPI: [name]. is a 60 y.o. male with medical history significant of DM; HTN; HLD; and PVD s/p recent aorta bifemoral bypass graft presenting with left leg pain and prior amputation of left 5th toe.  He reports that the toe is healing ok but he developed acute and recurrent LLE pain yesterday.  He has had mild persistent pain but this was sharp and very painful.  No CP or SOB.    ED Course: Carryover, per Dr. Bridgett Larsson:  60 yo male, recent admission for SFA occlusion. Presented to ER tonight for left leg pain. Also noted to have spontaneous left 5th toe amputation. EDP reports that pt told her that his "pinkie toe" fell off. Vascular surgery has been consulted. TRH asked to admit patient. Due to concern for continued leg ischemia, EDP thinks she is going to start IV heparin gtts.   **update. Talked again with EDP. Vascular surgery thinks pt has chronic leg ischemia and now new ischemia. Holding off on IV heparin. EDP sending cultures and starting abx. Also checking UDS due to pt's hx of cocaine abuse. Potential for vascular spasm due to cocaine use.  Review of Systems: As per HPI; otherwise review of systems reviewed and negative.   Ambulatory Status:  Ambulates with a cane  COVID Vaccine Status:   Complete  Past Medical History:  Diagnosis Date   Diabetes mellitus without complication ([place])    Hyperlipidemia    Hypertension    Peripheral vascular disease ([place])    Tobacco use     Past Surgical History:  Procedure Laterality Date   ABDOMINAL AORTOGRAM W/LOWER EXTREMITY Left 07/16/2019   Procedure: ABDOMINAL AORTOGRAM W/LOWER EXTREMITY;  Surgeon: Lorretta Harp, MD;   Location: [place] [facility];  Service: Cardiovascular;  Laterality: Left;   ABDOMINAL AORTOGRAM W/LOWER EXTREMITY Left 08/16/2019   ABDOMINAL AORTOGRAM W/LOWER EXTREMITY N/A 08/16/2019   Procedure: ABDOMINAL AORTOGRAM W/LOWER EXTREMITY;  Surgeon: Lorretta Harp, MD;  Location: [place] [facility];  Service: Cardiovascular;  Laterality: N/A;   ABDOMINAL AORTOGRAM W/LOWER EXTREMITY Bilateral 12/19/2019   Procedure: ABDOMINAL AORTOGRAM W/LOWER EXTREMITY;  Surgeon: Wellington Hampshire, MD;  Location: [place] [facility];  Service: Cardiovascular;  Laterality: Bilateral;   ABDOMINAL AORTOGRAM W/LOWER EXTREMITY Left 06/10/2020   Procedure: ABDOMINAL AORTOGRAM W/LOWER EXTREMITY;  Surgeon: Serafina Mitchell, MD;  Location: [place] [facility];  Service: Cardiovascular;  Laterality: Left;   ABDOMINAL AORTOGRAM W/LOWER EXTREMITY N/A 10/06/2020   Procedure: ABDOMINAL AORTOGRAM W/LOWER EXTREMITY;  Surgeon: Waynetta Sandy, MD;  Location: [place] [facility];  Service: Cardiovascular;  Laterality: N/A;   AMPUTATION TOE Left 06/13/2020   Procedure: Left Fifth Toe Amputation ;  Surgeon: Criselda Peaches, DPM;  Location: [place];  Service: Podiatry;  Laterality: Left;   AORTA - BILATERAL FEMORAL ARTERY BYPASS GRAFT Bilateral 10/09/2020   Procedure: AORTA BIFEMORAL BYPASS GRAFT;  Surgeon: Cherre Robins, MD;  Location: [facility];  Service: Vascular;  Laterality: Bilateral;   arm surgery     ENDARTERECTOMY FEMORAL Bilateral 10/09/2020   Procedure: ENDARTERECTOMY COMMON FEMORAL;  Surgeon: Cherre Robins, MD;  [facility];  Service: Vascular;  Laterality: Bilateral;   PERIPHERAL  VASCULAR BALLOON ANGIOPLASTY  08/16/2019   Procedure: PERIPHERAL VASCULAR BALLOON ANGIOPLASTY;  Surgeon: Lorretta Harp, MD;  Location: [place] [facility];  Service: Cardiovascular;;  attempted PTA of Left SFA   PERIPHERAL VASCULAR INTERVENTION Right 07/16/2019   Procedure: PERIPHERAL VASCULAR INTERVENTION;  Surgeon: Lorretta Harp,  MD;  Location: [place] [facility];  Service: Cardiovascular;  Laterality: Right;   PERIPHERAL VASCULAR INTERVENTION Right 12/19/2019   Procedure: PERIPHERAL VASCULAR INTERVENTION;  Surgeon: Wellington Hampshire, MD;  Location: [place] [facility];  Service: Cardiovascular;  Laterality: Right;  iliac   PERIPHERAL VASCULAR INTERVENTION Left 06/10/2020   Procedure: PERIPHERAL VASCULAR INTERVENTION;  Surgeon: Serafina Mitchell, MD;  Location: [place] [facility];  Service: Cardiovascular;  Laterality: Left;  SFA /COMMON ILIAC    Social History   Socioeconomic History   Marital status: Legally Separated    Spouse name: Not on file   Number of children: Not on file   Years of education: Not on file   Highest education level: Not on file  Occupational History   Not on file  Tobacco Use   Smoking status: Every Day    Packs/day: 0.50    Years: 40.00    Pack years: 20.00    Types: Cigarettes   Smokeless tobacco: Never  Vaping Use   Vaping Use: Never used  Substance and Sexual Activity   Alcohol use: Yes    Comment: beer and liquor daily   Drug use: Yes    Frequency: 3.0 times per week    Types: Marijuana, Cocaine   Sexual activity: Not on file  Other Topics Concern   Not on file  Social History Narrative   Not on file   Social Determinants of Health   Financial Resource Strain: Not on file  Food Insecurity: Not on file  Transportation Needs: Not on file  Physical Activity: Not on file  Stress: Not on file  Social Connections: Not on file  Intimate Partner Violence: Not on file    No Known Allergies  Family History  Problem Relation Age of Onset   Cancer Mother    Diabetes Father     Prior to Admission medications   Medication Sig Start Date End Date Taking? Authorizing Provider  acetaminophen (TYLENOL) 325 MG tablet Take 2 tablets (650 mg total) by mouth every 6 (six) hours as needed. 10/14/20  Yes Debbe Odea, MD  aspirin EC 81 MG EC tablet Take 1 tablet (81 mg total) by  mouth daily. Swallow whole. Patient taking differently: Take 81 mg by mouth daily at 2 PM. Swallow whole. 07/18/19  Yes Furth, Cadence H, PA-C  atorvastatin (LIPITOR) 40 MG tablet Take 40 mg by mouth daily. 04/06/20  Yes [provider]  Blood Pressure Monitor KIT Check blood pressure daily Patient taking differently: 1 each by Other route once a week. 10/20/18  Yes Jacelyn Pi, Lilia Argue, MD  gabapentin (NEURONTIN) 600 MG tablet Take 1 tablet (600 mg) in morning and afternoon. Take 2 tablets (1200 mg) at night Patient taking differently: Take 600-1,200 mg by mouth See admin instructions. 1200 mg in the morning and at bedtime 600 mg  in the afternoon 06/16/20  Yes Lavina Hamman, MD  lisinopril-hydrochlorothiazide (ZESTORETIC) 20-25 MG tablet Take 1 tablet by mouth daily. 04/16/20  Yes [provider]  Multiple Vitamin (MULTIVITAMIN WITH MINERALS) TABS tablet Take 1 tablet by mouth daily. 10/14/20  Yes Debbe Odea, MD  pantoprazole (PROTONIX) 40 MG tablet Take 1 tablet (  40 mg total) by mouth daily. 10/14/20  Yes Debbe Odea, MD  SANTYL ointment Apply 1 application topically 2 (two) times daily. 08/15/19  Yes [provider]  traMADol (ULTRAM) 50 MG tablet Take 1 tablet (50 mg total) by mouth every 6 (six) hours as needed for moderate pain. 10/15/20  Yes Dagoberto Ligas, PA-C  docusate sodium (COLACE) 100 MG capsule Take 1 capsule (100 mg total) by mouth 2 (two) times daily. Patient not taking: No sig reported 06/16/20   Lavina Hamman, MD  feeding supplement (ENSURE ENLIVE / ENSURE PLUS) LIQD Take 237 mLs by mouth 2 (two) times daily between meals. Patient not taking: Reported on 10/28/2020 10/14/20   Debbe Odea, MD  nicotine (NICODERM CQ - DOSED IN MG/24 HR) 7 mg/24hr patch Place 1 patch (7 mg total) onto the skin daily. Patient not taking: No sig reported 06/17/20   Lavina Hamman, MD    Physical Exam: Vitals:   10/28/20 0715 10/28/20 1000 10/28/20 1328 10/28/20 1330   BP: 120/70 (!) 159/90 (!) 160/89 (!) 160/89  Pulse: 85 84  (!) 101  Resp: _0 Temp:      TempSrc:      SpO2: 100% 100%  100%  Weight:         General:  Appears calm and comfortable and is in NAD; frail and chronically ill in appearance Eyes:  PERRL, EOMI, normal lids, iris ENT:  grossly normal hearing, lips & tongue, mmm; edentulous Neck:  no LAD, masses or thyromegaly Cardiovascular:  RRR, no m/r/g. No LE edema.  Respiratory:   CTA bilaterally with no wheezes/rales/rhonchi.  Normal respiratory effort. Abdomen:  soft, NT, ND Skin:  no rash or induration seen on limited exam; B lower leg skin thickening Musculoskeletal:  grossly normal tone BUE/BLE, good ROM, no bony abnormality Lower extremity:  No LE edema, lichenification of LE.  Diminished distal pulses. S/p L 5th toe amputation.    Psychiatric:  flat mood and affect, speech fluent and appropriate, AOx3 Neurologic:  CN 2-12 grossly intact, moves all extremities in coordinated fashion    Radiological Exams on Admission: Independently reviewed - see discussion in A/P where applicable  CT Angio Aortobifemoral W and/or Wo Contrast  Result Date: 10/28/2020 CLINICAL DATA:  60 year old male with history of a left 5th foot toe amputation several months ago. Increased lower extremity pain. No known injury. EXAM: CT ANGIOGRAPHY OF ABDOMINAL AORTA WITH ILIOFEMORAL RUNOFF TECHNIQUE: Multidetector CT imaging of the abdomen, pelvis and lower extremities was performed using the standard protocol during bolus administration of intravenous contrast. Multiplanar CT image reconstructions and MIPs were obtained to evaluate the vascular anatomy. CONTRAST:  158m OMNIPAQUE IOHEXOL 350 MG/ML SOLN COMPARISON:  CTA abdomen and pelvis 10/17/2020. 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 Sequelae of  aortobifemoral bypass. Proximal to the distal aortic graft the abdominal aorta demonstrates circumferential soft plaque. The celiac, SMA and main renal arteries remain patent. There is chronic proximal SMA stenosis (series 5, image 59) which is stable. 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 Patent bypass to the main femoral arteries. Stented bilateral native Common iliac arteries with reconstituted enhancement. And the right external iliac is stented and patent, although with high-grade stenosis distal to the stent (series 5, image 147). Long segment high-grade stenosis of the left external iliac artery which also remains patent. RIGHT Lower Extremity Postoperative changes at  the right Common femoral artery with superimposed soft plaque and narrowing of the femoral bifurcation. The right SFA and PFA remain patent. Atherosclerosis and stenosis of the right distal SFA with high-grade tandem stenoses (such as series 5, image 292). But the right popliteal artery remains patent. Right calf atherosclerosis with primarily 1 vessel runoff, peroneal artery. Intermittent calcified plaque. Reconstituted arterial enhancement in the right foot. LEFT Lower Extremity Postoperative changes to the left Common femoral artery with patent bypass anastomosis but chronically thrombosed left SFA at the bifurcation. Thrombosed left SFA stent which continues distally toward the knee. The left PFA remains patent. Reconstituted left popliteal artery. Calf atherosclerosis with primarily 1 vessel left lower extremity runoff, peroneal artery which continues to the dorsalis pedis. Asymmetrically decreased left foot arterial enhancement. Review of the MIP images confirms the above findings. NON-VASCULAR Lower chest: No cardiomegaly. No pericardial or pleural effusion. Mild lower lobe bronchiectasis. Mildly lower lung volumes. Hepatobiliary: Contracted gallbladder.  Liver is stable. Pancreas: Negative. Spleen: Negative. Adrenals/Urinary Tract: Negative.  Stomach/Bowel: No dilated large or small bowel. Retained stool throughout the colon. Occasional colonic diverticula. No free air, free fluid, or mesenteric inflammation identified. Lymphatic: No lymphadenopathy. Reproductive: Negative. Other: No pelvic free fluid. Musculoskeletal: Chronic lower lumbar spine degeneration. Left 5th phalanges are surgically absent. No acute osseous abnormality identified. No knee joint effusion identified. IMPRESSION: VASCULAR 1. 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 Reconstituted left popliteal artery. 2. Bilateral lower extremity peripheral vascular disease with only Single-vessel Runoff on both sides. Asymmetrically decreased enhancement of small arteries of the left foot. 3. Right SFA is patent although with tandem high-grade stenoses. Native external iliac arteries remain patent although with bilateral high-grade stenoses. 4. 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 NON-VASCULAR No superimposed acute or inflammatory process identified in the abdomen or pelvis. Electronically Signed   By: Genevie Ann M.D.   On: 10/28/2020 05:51   DG Chest Portable 1 View  Result Date: 10/28/2020 CLINICAL DATA:  60 year old male with history of left foot amputation 6 months ago. EXAM: PORTABLE CHEST 1 VIEW COMPARISON:  Chest x-ray 10/09/2020. FINDINGS: Lung volumes are normal. No consolidative airspace disease. No pleural effusions. No pneumothorax. No pulmonary nodule or mass noted. Pulmonary vasculature and the cardiomediastinal silhouette are within normal limits. IMPRESSION: No radiographic evidence of acute cardiopulmonary disease. Electronically Signed   By: Vinnie Langton M.D.   On: 10/28/2020 07:01   DG Foot Complete Left  Result Date: 10/28/2020 CLINICAL DATA:  60 year old male with history of amputation of the left fifth toe 6  months ago. Evaluate for osteomyelitis. EXAM: LEFT FOOT - COMPLETE 3+ VIEW COMPARISON:  10/05/2020. FINDINGS: Status post amputation of the fifth digit beyond the metatarsal-phalangeal joint. No destructive osseous lesions are identified on today's plain film examination. Small sclerotic lesion in the calcaneus with narrow zone of transition, similar to the prior study, likely a bone island. No acute displaced fracture, subluxation or dislocation. IMPRESSION: 1. No acute findings. Specifically, no definite imaging findings to indicate osteomyelitis. 2. Status post amputation of the fifth toe beyond the metatarsal-phalangeal joint. Electronically Signed   By: Vinnie Langton M.D.   On: 10/28/2020 07:01    EKG: Independently reviewed.  Sinus tachycardia with rate 109; nonspecific ST changes with no evidence of acute ischemia   Labs on Admission: I have personally reviewed the available labs and imaging studies at the time of the  admission.  Pertinent labs:   Glucose 118 WBC 5.8 Hgb 9.7 Platelets 501 COVID/flu negative UDS + THC  Assessment/Plan Principal Problem:   Critical limb ischemia of left lower extremity with bypass graft (HCC) Active Problems:   Essential hypertension   Pure hypercholesterolemia   Type 2 diabetes mellitus without complication, without long-term current use of insulin (HCC)   Tobacco use disorder   Marijuana abuse   Chronic lower limb ischemia/PVD -Patient with significant critical limb ischemia, s/p peripheral vascular catheterization on 07/16/19, 08/16/19, 12/19/19, 06/10/20, and 10/06/20 -Presented with LLE pain -Seen by Dr. Donzetta Matters, thought to have patent aortobifemoral bypass -Not recommended for further attempts at revascularization  -Continue ASA  LLE 5th toe amputation with ? infection -Prior toe amputation in 05/2020 -Now here with pain, ?wound healing -No current concerns for sepsis -Will treat with IV antibiotics (Rocephin/Flagyl/Vanc as per the lower  extremity wound algorithm) -Followed by podiatry, will ask Dr. Sherryle Lis to see -LE wound order set utilized including labs (CRP, ESR, A1c, prealbumin, HIV, and blood cultures) and consults (TOC team; peripheral vascular navigator; wound care; and nutrition)   DM -10/06/20 A1c was 5.5 -Not on medications -Continue Neurontin  HTN -Continue Zestoretic  HLD -Continue Lipitor  Tobacco dependence -Encourage cessation.     -Patch ordered  Marijuana abuse -Cessation should be encouraged on an ongoing basis -UDS ordered      Note: This patient has been tested and is negative for the novel coronavirus COVID-19. The patient has been fully vaccinated against COVID-19.   Level of care: Telemetry Medical DVT prophylaxis:  Lovenox  Code Status:  Full - confirmed with patient Family Communication: None present; I attempted but was unable to reach his sister by telephone at the time of admission. Disposition Plan:  The patient is from: home  Anticipated d/c is to: home without [facility] services \  Anticipated d/c date will depend on clinical response to treatment, but likely 2-3 days  Patient is currently: acutely ill Consults called: Vascular surgery; podiatry; [place] team; peripheral vascular navigator; wound care; and nutrition  Admission status:  Admit - It is my clinical opinion that admission to [place] is reasonable and necessary because of the expectation that this patient will require hospital care that crosses at least 2 midnights to treat this condition based on the medical complexity of the problems presented.  Given the aforementioned information, the predictability of an adverse outcome is felt to be significant.    Karmen Bongo MD [facility]   How to contact the [facility] Attending or Consulting provider [place] or covering provider during after hours [place], for this patient?  Check the care team in [facility] and look for a) attending/consulting TRH provider listed and b) the [facility]  team listed Log into [email or web address] and use [place]'s universal password to access. If you do not have the password, please contact the hospital operator. Locate the [facility] provider you are looking for under [facility] and page to a number that you can be directly reached. If you still have difficulty reaching the provider, please page the [facility] (Director on Call) for the Hospitalists listed on amion for assistance.   10/28/2020, 3:54 PM

## 2020-10-28 NOTE — ED Triage Notes (Signed)
Pt had his pinky toe on his L foot amputated several months ago, reports increased pain x 2 hours. Denies injury

## 2020-10-28 NOTE — Consult Note (Signed)
Hospital Consult    Reason for Consult:  leg pain Referring Physician:  Dr. Randal Buba MRN #:  [id number]  History of Present Illness: This is a 60 y.o. male previous history of aortobifemoral bypass.  This was after left SFA stenting and occluded.  At that time he had left fifth toe amputation.  He now states that he has pain at the fifth toe amputation site.  He does not have any drainage.  He denies any fevers or chills.  He does continue to have occasional abdominal pain.  Past Medical History:  Diagnosis Date   Diabetes mellitus without complication ([place])    Hyperlipidemia    Hypertension    Peripheral vascular disease ([place])    Tobacco use     Past Surgical History:  Procedure Laterality Date   ABDOMINAL AORTOGRAM W/LOWER EXTREMITY Left 07/16/2019   Procedure: ABDOMINAL AORTOGRAM W/LOWER EXTREMITY;  Surgeon: Lorretta Harp, MD;  Location: [facility];  Service: Cardiovascular;  Laterality: Left;   ABDOMINAL AORTOGRAM W/LOWER EXTREMITY Left 08/16/2019   ABDOMINAL AORTOGRAM W/LOWER EXTREMITY N/A 08/16/2019   Procedure: ABDOMINAL AORTOGRAM W/LOWER EXTREMITY;  Surgeon: Lorretta Harp, MD;  Location: [place];  Service: Cardiovascular;  Laterality: N/A;   ABDOMINAL AORTOGRAM W/LOWER EXTREMITY Bilateral 12/19/2019   Procedure: ABDOMINAL AORTOGRAM W/LOWER EXTREMITY;  Surgeon: Wellington Hampshire, MD;  Location: [facility];  Service: Cardiovascular;  Laterality: Bilateral;   ABDOMINAL AORTOGRAM W/LOWER EXTREMITY Left 06/10/2020   Procedure: ABDOMINAL AORTOGRAM W/LOWER EXTREMITY;  Surgeon: Serafina Mitchell, MD;  Location: [facility];  Service: Cardiovascular;  Laterality: Left;   ABDOMINAL AORTOGRAM W/LOWER EXTREMITY N/A 10/06/2020   Procedure: ABDOMINAL AORTOGRAM W/LOWER EXTREMITY;  Surgeon: Waynetta Sandy, MD;  Location: [facility];  Service: Cardiovascular;  Laterality: N/A;   AMPUTATION TOE Left 06/13/2020   Procedure: Left Fifth Toe  Amputation ;  Surgeon: Criselda Peaches, DPM;  Location: [place];  Service: Podiatry;  Laterality: Left;   AORTA - BILATERAL FEMORAL ARTERY BYPASS GRAFT Bilateral 10/09/2020   Procedure: AORTA BIFEMORAL BYPASS GRAFT;  Surgeon: Cherre Robins, MD;  Location: [place];  Service: Vascular;  Laterality: Bilateral;   arm surgery     ENDARTERECTOMY FEMORAL Bilateral 10/09/2020   Procedure: ENDARTERECTOMY COMMON FEMORAL;  Surgeon: Cherre Robins, MD;  Location: [place];  Service: Vascular;  Laterality: Bilateral;   PERIPHERAL VASCULAR BALLOON ANGIOPLASTY  08/16/2019   Procedure: PERIPHERAL VASCULAR BALLOON ANGIOPLASTY;  Surgeon: Lorretta Harp, MD;  Location: [facility];  Service: Cardiovascular;;  attempted PTA of Left SFA   PERIPHERAL VASCULAR INTERVENTION Right 07/16/2019   Procedure: PERIPHERAL VASCULAR INTERVENTION;  Surgeon: Lorretta Harp, MD;  Location: [facility];  Service: Cardiovascular;  Laterality: Right;   PERIPHERAL VASCULAR INTERVENTION Right 12/19/2019   Procedure: PERIPHERAL VASCULAR INTERVENTION;  Surgeon: Wellington Hampshire, MD;  Location: [facility];  Service: Cardiovascular;  Laterality: Right;  iliac   PERIPHERAL VASCULAR INTERVENTION Left 06/10/2020   Procedure: PERIPHERAL VASCULAR INTERVENTION;  Surgeon: Serafina Mitchell, MD;  Location: [facility];  Service: Cardiovascular;  Laterality: Left;  SFA /COMMON ILIAC    No Known Allergies  Prior to Admission medications   Medication Sig Start Date End Date Taking? Authorizing Provider  acetaminophen (TYLENOL) 325 MG tablet Take 2 tablets (650 mg total) by mouth every 6 (six) hours as needed. 10/14/20  Yes Debbe Odea, MD  aspirin EC 81 MG EC tablet Take  1 tablet (81 mg total) by mouth daily. Swallow whole. Patient taking differently: Take 81 mg by mouth daily at 2 PM. Swallow whole. 07/18/19  Yes Furth, Cadence H, PA-C  atorvastatin (LIPITOR) 40 MG tablet Take 40 mg by mouth daily. 04/06/20  Yes  [provider]  Blood Pressure Monitor KIT Check blood pressure daily Patient taking differently: 1 each by Other route once a week. 10/20/18  Yes Jacelyn Pi, Lilia Argue, MD  gabapentin (NEURONTIN) 600 MG tablet Take 1 tablet (600 mg) in morning and afternoon. Take 2 tablets (1200 mg) at night Patient taking differently: Take 600-1,200 mg by mouth See admin instructions. 1200 mg in the morning and at bedtime 600 mg  in the afternoon 06/16/20  Yes Lavina Hamman, MD  lisinopril-hydrochlorothiazide (ZESTORETIC) 20-25 MG tablet Take 1 tablet by mouth daily. 04/16/20  Yes [provider]  Multiple Vitamin (MULTIVITAMIN WITH MINERALS) TABS tablet Take 1 tablet by mouth daily. 10/14/20  Yes Debbe Odea, MD  pantoprazole (PROTONIX) 40 MG tablet Take 1 tablet (40 mg total) by mouth daily. 10/14/20  Yes Debbe Odea, MD  SANTYL ointment Apply 1 application topically 2 (two) times daily. 08/15/19  Yes [provider]  traMADol (ULTRAM) 50 MG tablet Take 1 tablet (50 mg total) by mouth every 6 (six) hours as needed for moderate pain. 10/15/20  Yes Dagoberto Ligas, PA-C  docusate sodium (COLACE) 100 MG capsule Take 1 capsule (100 mg total) by mouth 2 (two) times daily. Patient not taking: No sig reported 06/16/20   Lavina Hamman, MD  feeding supplement (ENSURE ENLIVE / ENSURE PLUS) LIQD Take 237 mLs by mouth 2 (two) times daily between meals. Patient not taking: Reported on 10/28/2020 10/14/20   Debbe Odea, MD  nicotine (NICODERM CQ - DOSED IN MG/24 HR) 7 mg/24hr patch Place 1 patch (7 mg total) onto the skin daily. Patient not taking: No sig reported 06/17/20   Lavina Hamman, MD    Social History   Socioeconomic History   Marital status: Legally Separated    Spouse name: Not on file   Number of children: Not on file   Years of education: Not on file   Highest education level: Not on file  Occupational History   Not on file  Tobacco Use   Smoking status: Every Day     Packs/day: 0.50    Years: 40.00    Pack years: 20.00    Types: Cigarettes   Smokeless tobacco: Never  Vaping Use   Vaping Use: Never used  Substance and Sexual Activity   Alcohol use: Yes    Comment: beer and liquor daily   Drug use: Yes    Frequency: 3.0 times per week    Types: Marijuana   Sexual activity: Not on file  Other Topics Concern   Not on file  Social History Narrative   Not on file   Social Determinants of Health   Financial Resource Strain: Not on file  Food Insecurity: Not on file  Transportation Needs: Not on file  Physical Activity: Not on file  Stress: Not on file  Social Connections: Not on file  Intimate Partner Violence: Not on file     Family History  Problem Relation Age of Onset   Cancer Mother    Diabetes Father     Review of Systems  Constitutional: Negative.   HENT: Negative.    Eyes: Negative.   Respiratory: Negative.    Cardiovascular: Negative.   Gastrointestinal:  Positive for abdominal pain.  Musculoskeletal:        Left foot pain  Skin: Negative.   Neurological: Negative.   Endo/Heme/Allergies: Negative.   Psychiatric/Behavioral: Negative.      Physical Examination  Vitals:   10/28/20 0530 10/28/20 0545  BP: 135/69 (!) 158/81  Pulse: 98 (!) 105  Resp: 17 16  Temp:    SpO2: 100% 100%   Body mass index is 18.51 kg/m.  Physical Exam HENT:     Head: Normocephalic.     Nose: Nose normal.  Eyes:     Pupils: Pupils are equal, round, and reactive to light.  Cardiovascular:     Comments: Bilateral common femoral pulses are palpable Pulmonary:     Effort: Pulmonary effort is normal.  Abdominal:     General: Abdomen is flat.     Palpations: Abdomen is soft.     Comments: Incisions healing well  Musculoskeletal:        General: No swelling.     Comments: Left fifth toe amputation site with scab Bilateral groin incisions healing well, hematoma improving left groin  Skin:    General: Skin is warm and dry.      Capillary Refill: Capillary refill takes less than 2 seconds.  Neurological:     General: No focal deficit present.     Mental Status: He is alert.  Psychiatric:        Mood and Affect: Mood normal.        Behavior: Behavior normal.        Thought Content: Thought content normal.        Judgment: Judgment normal.     CBC    Component Value Date/Time   WBC 5.8 10/28/2020 0345   RBC 3.39 (L) 10/28/2020 0345   HGB 9.7 (L) 10/28/2020 0345   HGB 11.7 (L) 12/12/2019 1006   HCT 30.6 (L) 10/28/2020 0345   HCT 36.2 (L) 12/12/2019 1006   PLT 501 (H) 10/28/2020 0345   PLT 249 12/12/2019 1006   MCV 90.3 10/28/2020 0345   MCV 90 12/12/2019 1006   MCH 28.6 10/28/2020 0345   MCHC 31.7 10/28/2020 0345   RDW 16.9 (H) 10/28/2020 0345   RDW 14.5 12/12/2019 1006   LYMPHSABS 1.4 10/28/2020 0345   LYMPHSABS 1.5 07/31/2019 1401   MONOABS 0.7 10/28/2020 0345   EOSABS 0.3 10/28/2020 0345   EOSABS 0.2 07/31/2019 1401   BASOSABS 0.0 10/28/2020 0345   BASOSABS 0.0 07/31/2019 1401    BMET    Component Value Date/Time   NA 141 10/28/2020 0345   NA 136 12/12/2019 1006   K 3.8 10/28/2020 0345   CL 101 10/28/2020 0345   CO2 29 10/28/2020 0345   GLUCOSE 118 (H) 10/28/2020 0345   BUN 10 10/28/2020 0345   BUN 18 12/12/2019 1006   CREATININE 0.73 10/28/2020 0345   CALCIUM 9.5 10/28/2020 0345   GFRNONAA >60 10/28/2020 0345   GFRAA 82 12/12/2019 1006    COAGS: Lab Results  Component Value Date   INR 1.1 10/09/2020   INR 1.1 10/05/2020   INR 1.0 06/06/2020     Non-Invasive Vascular Imaging:   CTA IMPRESSION: VASCULAR   1. 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 Reconstituted left popliteal artery.   2. Bilateral lower extremity peripheral vascular disease with only Single-vessel Runoff on both sides. Asymmetrically decreased enhancement of small arteries of the left foot.   3.  Right  SFA is patent although with tandem high-grade stenoses. Native external iliac arteries remain patent although with bilateral high-grade stenoses.   4. 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   ASSESSMENT/PLAN: This is a 60 y.o. male left SFA stenting for left fifth toe amputation.  This does appear to be healing with a scab in place.  His aortobifemoral bypass is patent.  Unless podiatry feels otherwise I would not recommend any further attempts at revascularization at this time which would require left common femoral to below-knee popliteal artery bypass given the recent surgery that has had.  I will notify his primary surgeon of admission.  Hugo Lybrand C. Donzetta Matters, MD [facility] Office: [phone] Pager: [phone]

## 2020-10-28 NOTE — ED Provider Notes (Signed)
Emergency Medicine Provider Triage Evaluation Note  [name]. , a 60 y.o. male  was evaluated in triage.  Pt complains of left leg pain.  Hx of recent femoral bipass graft.  States that his leg pain awakened him from sleep.  Review of Systems  Positive: Leg pain Negative: Fever, chills  Physical Exam  BP (!) 144/110 (BP Location: Left Arm)   Pulse (!) 114   Temp 98.5 F (36.9 C) (Oral)   Resp 17   SpO2 100%  Gen:   Awake, no distress   Resp:  Normal effort  MSK:   Moves extremities without difficulty  Other:  Unable to palpate distal pulses, occasional doppler signal, but nothing consistent  Medical Decision Making  Medically screening exam initiated at 3:35 AM.  Appropriate orders placed.  Ruben [employer]. was informed that the remainder of the evaluation will be completed by another provider, this initial triage assessment does not replace that evaluation, and the importance of remaining in the ED until their evaluation is complete.  Notified triage and charge RNs that patient will need a room for evaluation of suspected ischemic leg.   Ruben Circle, PA-C 10/28/20 [name], April, MD 10/28/20 [phone]

## 2020-10-28 NOTE — Consult Note (Signed)
Reason for Consult: Left foot ulceration / CLI Referring Physician: Weber Cooks. is an 60 y.o. male.  HPI: Patient evaluated bedside. Patient somnolent and does not contribute much to history. States his left leg and fooot started hurting yesterday. He denies injury, unsure why it started.   Of note patient underwent amputation of the 5th toe 06/13/20; Aorto-bi-fem bypass, left and right femoral endarterectomy 10/09/20.  Past Medical History:  Diagnosis Date   Diabetes mellitus without complication ([place])    Hyperlipidemia    Hypertension    Peripheral vascular disease ([place])    Tobacco use     Past Surgical History:  Procedure Laterality Date   ABDOMINAL AORTOGRAM W/LOWER EXTREMITY Left 07/16/2019   Procedure: ABDOMINAL AORTOGRAM W/LOWER EXTREMITY;  Surgeon: Lorretta Harp, MD;  Location: [facility];  Service: Cardiovascular;  Laterality: Left;   ABDOMINAL AORTOGRAM W/LOWER EXTREMITY Left 08/16/2019   ABDOMINAL AORTOGRAM W/LOWER EXTREMITY N/A 08/16/2019   Procedure: ABDOMINAL AORTOGRAM W/LOWER EXTREMITY;  Surgeon: Lorretta Harp, MD;  Location: [facility];  Service: Cardiovascular;  Laterality: N/A;   ABDOMINAL AORTOGRAM W/LOWER EXTREMITY Bilateral 12/19/2019   Procedure: ABDOMINAL AORTOGRAM W/LOWER EXTREMITY;  Surgeon: Wellington Hampshire, MD;  Location: [facility];  Service: Cardiovascular;  Laterality: Bilateral;   ABDOMINAL AORTOGRAM W/LOWER EXTREMITY Left 06/10/2020   Procedure: ABDOMINAL AORTOGRAM W/LOWER EXTREMITY;  Surgeon: Serafina Mitchell, MD;  Location: [facility];  Service: Cardiovascular;  Laterality: Left;   ABDOMINAL AORTOGRAM W/LOWER EXTREMITY N/A 10/06/2020   Procedure: ABDOMINAL AORTOGRAM W/LOWER EXTREMITY;  Surgeon: Waynetta Sandy, MD;  Location: [facility];  Service: Cardiovascular;  Laterality: N/A;   AMPUTATION TOE Left 06/13/2020   Procedure: Left Fifth Toe Amputation ;  Surgeon: Criselda Peaches,  DPM;  Location: [facility];  Service: Podiatry;  Laterality: Left;   AORTA - BILATERAL FEMORAL ARTERY BYPASS GRAFT Bilateral 10/09/2020   Procedure: AORTA BIFEMORAL BYPASS GRAFT;  Surgeon: Cherre Robins, MD;  Location: [facility];  Service: Vascular;  Laterality: Bilateral;   arm surgery     ENDARTERECTOMY FEMORAL Bilateral 10/09/2020   Procedure: ENDARTERECTOMY COMMON FEMORAL;  Surgeon: Cherre Robins, MD;  Location: [facility];  Service: Vascular;  Laterality: Bilateral;   PERIPHERAL VASCULAR BALLOON ANGIOPLASTY  08/16/2019   Procedure: PERIPHERAL VASCULAR BALLOON ANGIOPLASTY;  Surgeon: Lorretta Harp, MD;  Location: [facility];  Service: Cardiovascular;;  attempted PTA of Left SFA   PERIPHERAL VASCULAR INTERVENTION Right 07/16/2019   Procedure: PERIPHERAL VASCULAR INTERVENTION;  Surgeon: Lorretta Harp, MD;  Location: [facility];  Service: Cardiovascular;  Laterality: Right;   PERIPHERAL VASCULAR INTERVENTION Right 12/19/2019   Procedure: PERIPHERAL VASCULAR INTERVENTION;  Surgeon: Wellington Hampshire, MD;  Location: [facility];  Service: Cardiovascular;  Laterality: Right;  iliac   PERIPHERAL VASCULAR INTERVENTION Left 06/10/2020   Procedure: PERIPHERAL VASCULAR INTERVENTION;  Surgeon: Serafina Mitchell, MD;  Location: [facility];  Service: Cardiovascular;  Laterality: Left;  SFA /COMMON ILIAC    Family History  Problem Relation Age of Onset   Cancer Mother    Diabetes Father     Social History:  reports that he has been smoking cigarettes. He has a 20.00 pack-year smoking history. He has never used smokeless tobacco. He reports current alcohol use. He reports current drug use. Frequency: 3.00 times per week. Drugs: Marijuana and Cocaine.  Allergies: No Known Allergies  Medications: I have reviewed the  patient's current medications.  Results for orders placed or performed during the hospital encounter of 10/28/20 (from the past 48 hour(s))  Resp Panel by RT-PCR  (Flu A&B, Covid) Nasopharyngeal Swab     Status: None   Collection Time: 10/28/20  3:34 AM   Specimen: Nasopharyngeal Swab; Nasopharyngeal(NP) swabs in vial transport medium  Result Value Ref Range   SARS Coronavirus 2 by RT PCR NEGATIVE NEGATIVE    Comment: (NOTE) SARS-CoV-2 target nucleic acids are NOT DETECTED.  The SARS-CoV-2 RNA is generally detectable in upper respiratory specimens during the acute phase of infection. The lowest concentration of SARS-CoV-2 viral copies this assay can detect is 138 copies/mL. A negative result does not preclude SARS-Cov-2 infection and should not be used as the sole basis for treatment or other patient management decisions. A negative result may occur with  improper specimen collection/handling, submission of specimen other than nasopharyngeal swab, presence of viral mutation(s) within the areas targeted by this assay, and inadequate number of viral copies(<138 copies/mL). A negative result must be combined with clinical observations, patient history, and epidemiological information. The expected result is Negative.  Fact Sheet for Patients:  EntrepreneurPulse.com.au  Fact Sheet for Healthcare Providers:  IncredibleEmployment.be  This test is no t yet approved or cleared by the [place] FDA and  has been authorized for detection and/or diagnosis of SARS-CoV-2 by FDA under an Emergency Use Authorization (EUA). This EUA will remain  in effect (meaning this test can be used) for the duration of the COVID-19 declaration under Section 564(b)(1) of the Act, 21 U.S.C.section 360bbb-3(b)(1), unless the authorization is terminated  or revoked sooner.       Influenza A by PCR NEGATIVE NEGATIVE   Influenza B by PCR NEGATIVE NEGATIVE    Comment: (NOTE) The Xpert Xpress SARS-CoV-2/FLU/RSV plus assay is intended as an aid in the diagnosis of influenza from Nasopharyngeal swab specimens and should not be used as  a sole basis for treatment. Nasal washings and aspirates are unacceptable for Xpert Xpress SARS-CoV-2/FLU/RSV testing.  Fact Sheet for Patients: EntrepreneurPulse.com.au  Fact Sheet for Healthcare Providers: IncredibleEmployment.be  This test is not yet approved or cleared by the [place] FDA and has been authorized for detection and/or diagnosis of SARS-CoV-2 by FDA under an Emergency Use Authorization (EUA). This EUA will remain in effect (meaning this test can be used) for the duration of the COVID-19 declaration under Section 564(b)(1) of the Act, 21 U.S.C. section 360bbb-3(b)(1), unless the authorization is terminated or revoked.  Performed at [facility], [place] [address], [place], [place] [address]   CBC with Differential/Platelet     Status: Abnormal   Collection Time: 10/28/20  3:45 AM  Result Value Ref Range   WBC 5.8 4.0 - 10.5 K/uL   RBC 3.39 (L) 4.22 - 5.81 MIL/uL   Hemoglobin 9.7 (L) 13.0 - 17.0 g/dL   HCT 30.6 (L) 39.0 - 52.0 %   MCV 90.3 80.0 - 100.0 fL   MCH 28.6 26.0 - 34.0 pg   MCHC 31.7 30.0 - 36.0 g/dL   RDW 16.9 (H) 11.5 - 15.5 %   Platelets 501 (H) 150 - 400 K/uL   nRBC 0.0 0.0 - 0.2 %   Neutrophils Relative % 57 %   Neutro Abs 3.4 1.7 - 7.7 K/uL   Lymphocytes Relative 24 %   Lymphs Abs 1.4 0.7 - 4.0 K/uL   Monocytes Relative 12 %   Monocytes Absolute 0.7 0.1 - 1.0 K/uL  Eosinophils Relative 6 %   Eosinophils Absolute 0.3 0.0 - 0.5 K/uL   Basophils Relative 1 %   Basophils Absolute 0.0 0.0 - 0.1 K/uL   Immature Granulocytes 0 %   Abs Immature Granulocytes 0.02 0.00 - 0.07 K/uL    Comment: Performed at [facility] [address] [place], [place] [address]  Comprehensive metabolic panel     Status: Abnormal   Collection Time: 10/28/20  3:45 AM  Result Value Ref Range   Sodium 141 135 - 145 mmol/L   Potassium 3.8 3.5 - 5.1 mmol/L   Chloride 101 98 - 111 mmol/L   CO2 29 22 - 32 mmol/L    Glucose, Bld 118 (H) 70 - 99 mg/dL    Comment: Glucose reference range applies only to samples taken after fasting for at least 8 hours.   BUN 10 8 - 23 mg/dL   Creatinine, Ser 0.73 0.61 - 1.24 mg/dL   Calcium 9.5 8.9 - 10.3 mg/dL   Total Protein 7.0 6.5 - 8.1 g/dL   Albumin 3.6 3.5 - 5.0 g/dL   AST 25 15 - 41 U/L   ALT 18 0 - 44 U/L   Alkaline Phosphatase 71 38 - 126 U/L   Total Bilirubin 0.6 0.3 - 1.2 mg/dL   GFR, Estimated >60 >60 mL/min    Comment: (NOTE) Calculated using the CKD-EPI Creatinine Equation (2021)    Anion gap 11 5 - 15    Comment: Performed at [facility] [address] [place], [place] [address]  Sedimentation rate     Status: Abnormal   Collection Time: 10/28/20 10:27 AM  Result Value Ref Range   Sed Rate 50 (H) 0 - 16 mm/hr    Comment: Performed at [facility] [address], [place], [place] [address]  C-reactive protein     Status: Abnormal   Collection Time: 10/28/20 10:27 AM  Result Value Ref Range   CRP 1.3 (H) <1.0 mg/dL    Comment: Performed at [facility] [address], [place], [place] [address]  Prealbumin     Status: None   Collection Time: 10/28/20 10:27 AM  Result Value Ref Range   Prealbumin 20.6 18 - 38 mg/dL    Comment: Performed at [facility] [address] [place], [place] [address]  Lactic acid     Status: None   Collection Time: 10/28/20 10:27 AM  Result Value Ref Range   Lactic Acid, Venous 1.5 0.5 - 1.9 mmol/L    Comment: Performed at [facility] [address], [place],  [address]  Urine rapid drug screen (hosp performed)     Status: Abnormal   Collection Time: 10/28/20 11:00 AM  Result Value Ref Range   Opiates NONE DETECTED NONE DETECTED   Cocaine NONE DETECTED NONE DETECTED   Benzodiazepines NONE DETECTED NONE DETECTED   Amphetamines NONE DETECTED NONE DETECTED   Tetrahydrocannabinol POSITIVE (A) NONE DETECTED   Barbiturates NONE DETECTED NONE DETECTED    Comment:  (NOTE) DRUG SCREEN FOR MEDICAL PURPOSES ONLY.  IF CONFIRMATION IS NEEDED FOR ANY PURPOSE, NOTIFY LAB WITHIN 5 DAYS.  LOWEST DETECTABLE LIMITS FOR URINE DRUG SCREEN Drug Class                     Cutoff (ng/mL) Amphetamine and metabolites    1000 Barbiturate and metabolites    200 Benzodiazepine  [id number] Tricyclics and metabolites     300 Opiates and metabolites        300 Cocaine and metabolites        300 THC                            50 Performed at [facility], [place] [address] [place], [place] [address]     CT Angio Aortobifemoral W and/or Wo Contrast  Result Date: 10/28/2020 CLINICAL DATA:  59 year old male with history of a left 5th foot toe amputation several months ago. Increased lower extremity pain. No known injury. EXAM: CT ANGIOGRAPHY OF ABDOMINAL AORTA WITH ILIOFEMORAL RUNOFF TECHNIQUE: Multidetector CT imaging of the abdomen, pelvis and lower extremities was performed using the standard protocol during bolus administration of intravenous contrast. Multiplanar CT image reconstructions and MIPs were obtained to evaluate the vascular anatomy. CONTRAST:  168m OMNIPAQUE IOHEXOL 350 MG/ML SOLN COMPARISON:  CTA abdomen and pelvis 10/17/2020. 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 Sequelae of aortobifemoral bypass. Proximal to the distal aortic graft the abdominal aorta demonstrates circumferential soft plaque. The celiac, SMA and main renal arteries remain patent. There is chronic proximal SMA stenosis (series 5, image 59) which is stable. 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 Patent bypass to the main femoral arteries. Stented bilateral native Common iliac arteries with reconstituted enhancement. And the right external iliac is stented and patent, although with high-grade stenosis distal to the stent (series 5, image 147). Long  segment high-grade stenosis of the left external iliac artery which also remains patent. RIGHT Lower Extremity Postoperative changes at the right Common femoral artery with superimposed soft plaque and narrowing of the femoral bifurcation. The right SFA and PFA remain patent. Atherosclerosis and stenosis of the right distal SFA with high-grade tandem stenoses (such as series 5, image 292). But the right popliteal artery remains patent. Right calf atherosclerosis with primarily 1 vessel runoff, peroneal artery. Intermittent calcified plaque. Reconstituted arterial enhancement in the right foot. LEFT Lower Extremity Postoperative changes to the left Common femoral artery with patent bypass anastomosis but chronically thrombosed left SFA at the bifurcation. Thrombosed left SFA stent which continues distally toward the knee. The left PFA remains patent. Reconstituted left popliteal artery. Calf atherosclerosis with primarily 1 vessel left lower extremity runoff, peroneal artery which continues to the dorsalis pedis. Asymmetrically decreased left foot arterial enhancement. Review of the MIP images confirms the above findings. NON-VASCULAR Lower chest: No cardiomegaly. No pericardial or pleural effusion. Mild lower lobe bronchiectasis. Mildly lower lung volumes. Hepatobiliary: Contracted gallbladder.  Liver is stable. Pancreas: Negative. Spleen: Negative. Adrenals/Urinary Tract: Negative. Stomach/Bowel: No dilated large or small bowel. Retained stool throughout the colon. Occasional colonic diverticula. No free air, free fluid, or mesenteric inflammation identified. Lymphatic: No lymphadenopathy. Reproductive: Negative. Other: No pelvic free fluid. Musculoskeletal: Chronic lower lumbar spine degeneration. Left 5th phalanges are surgically absent. No acute osseous abnormality identified. No knee joint effusion identified. IMPRESSION: VASCULAR 1. 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 Reconstituted left popliteal artery. 2. Bilateral lower extremity peripheral vascular disease with only Single-vessel Runoff on both sides. Asymmetrically decreased enhancement of small arteries of the left foot. 3. Right SFA is patent although with tandem high-grade stenoses. Native external iliac arteries remain patent although with bilateral  high-grade stenoses. 4. 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 NON-VASCULAR No superimposed acute or inflammatory process identified in the abdomen or pelvis. Electronically Signed   By: Genevie Ann M.D.   On: 10/28/2020 05:51   DG Chest Portable 1 View  Result Date: 10/28/2020 CLINICAL DATA:  60 year old male with history of left foot amputation 6 months ago. EXAM: PORTABLE CHEST 1 VIEW COMPARISON:  Chest x-ray 10/09/2020. FINDINGS: Lung volumes are normal. No consolidative airspace disease. No pleural effusions. No pneumothorax. No pulmonary nodule or mass noted. Pulmonary vasculature and the cardiomediastinal silhouette are within normal limits. IMPRESSION: No radiographic evidence of acute cardiopulmonary disease. Electronically Signed   By: Vinnie Langton M.D.   On: 10/28/2020 07:01   DG Foot Complete Left  Result Date: 10/28/2020 CLINICAL DATA:  60 year old male with history of amputation of the left fifth toe 6 months ago. Evaluate for osteomyelitis. EXAM: LEFT FOOT - COMPLETE 3+ VIEW COMPARISON:  10/05/2020. FINDINGS: Status post amputation of the fifth digit beyond the metatarsal-phalangeal joint. No destructive osseous lesions are identified on today's plain film examination. Small sclerotic lesion in the calcaneus with narrow zone of transition, similar to the prior study, likely a bone island. No acute displaced fracture, subluxation or dislocation. IMPRESSION: 1. No acute findings. Specifically, no definite imaging findings to indicate  osteomyelitis. 2. Status post amputation of the fifth toe beyond the metatarsal-phalangeal joint. Electronically Signed   By: Vinnie Langton M.D.   On: 10/28/2020 07:01     Blood pressure 136/80, pulse 99, temperature 98.5 F (36.9 C), temperature source Oral, resp. rate 16, weight 45.9 kg, SpO2 100 %.  Vitals:   10/28/20 1800 10/28/20 1900  BP: (!) 167/85 136/80  Pulse: 89 99  Resp:    Temp:    SpO2: 100% 100%    General AA&O x3. Normal mood and affect.  Vascular Dorsalis pedis and posterior tibial pulses non-palpable left. Pedal hair growth absent.  Neurologic Epicritic sensation grossly present.  Dermatologic (Wound) Wound Location: left 5th metatarsal amputation site Wound Measurement: 0.3 cm diameter Wound Base: dry, mild fibrosis Peri-wound: redundant skin. No warmth, erythema, SOI noted. Exudate: wound dry  Orthopedic: Motor intact BLE.    Assessment/Plan:  Left foot ulcer, hx of 5th toe amputation; PAD -Imaging: Studies independently reviewed. No need for MRI at this time. Reviewed CTA. -Antibiotics: no need for abx for pedal issue. No cellulitis. -WB Status: WBAT LLE in surgical shoe. WBAT RLE -Wound Care: Recc Santyl WTD dressings. Order placed. -Surgical Plan: No plan for surgical intervention at this time. Patient does have a small ulceration at previous amputation site. This does not appear to be deep. XR do not show bone involvement. Agree with vascular to hold off intervention at this time as the wound has only small area of incomplete healing that appears uninfected. Would best benefit from outpatient management of the wound. Patient can be seen in the office post-discharge.  Evelina Bucy 10/28/2020, 7:24 PM   Best available via secure chat for questions or concerns.

## 2020-10-28 NOTE — ED Provider Notes (Signed)
[facility] EMERGENCY DEPARTMENT Provider Note   CSN: [id number] Arrival date & time: 10/28/20  0316     History Chief Complaint  Patient presents with   Foot Pain    Ruben Petrak. is a 60 y.o. male.  The history is provided by the patient.  Foot Pain This is a new problem. The current episode started 1 to 2 hours ago. The problem occurs constantly. The problem has not changed since onset.Pertinent negatives include no chest pain and no shortness of breath. Nothing aggravates the symptoms. Nothing relieves the symptoms. He has tried nothing for the symptoms. The treatment provided no relief.   60 y.o. M with hx of DM, HLP, HTN, PVD followed by vascular surgery, here with sudden onset left lower extremity pain upon waking and loss of left 5th digit s/p amputation and worseing pain at opening wound there.  Pain worse with foot down on floor.  No noted pulse in triage.  Denies chest pain, SOB, diaphoresis, nausea, vomiting, rashes, fever.  Had left aorotobifemoral bypass 10/06/20 with Dr. Donzetta Matters.  Past Medical History:  Diagnosis Date   Diabetes mellitus without complication ([place])    Hyperlipidemia    Hypertension    Peripheral vascular disease ([place])    Tobacco use     Patient Active Problem List   Diagnosis Date Noted   Nicotine dependence, cigarettes, uncomplicated 16/96/7893   Mixed hyperlipidemia 10/05/2020   Protein-calorie malnutrition, severe 06/07/2020   Osteomyelitis of fifth toe of left foot ([place]) 06/07/2020   Tobacco use    Toe infection 06/06/2020   PVD (peripheral vascular disease) ([place]) 12/19/2019   Claudication in peripheral vascular disease ([place]) 08/16/2019   Critical ischemia of foot ([place]) 07/16/2019   Critical lower limb ischemia ([place]) 07/10/2019   Pure hypercholesterolemia 10/13/2018   Type 2 diabetes mellitus without complication, without long-term current use of insulin ([place]) 10/13/2018   Tobacco use disorder 10/13/2018    Essential hypertension 10/23/2009    Past Surgical History:  Procedure Laterality Date   ABDOMINAL AORTOGRAM W/LOWER EXTREMITY Left 07/16/2019   Procedure: ABDOMINAL AORTOGRAM W/LOWER EXTREMITY;  Surgeon: Lorretta Harp, MD;  Location: [place] [facility];  Service: Cardiovascular;  Laterality: Left;   ABDOMINAL AORTOGRAM W/LOWER EXTREMITY Left 08/16/2019   ABDOMINAL AORTOGRAM W/LOWER EXTREMITY N/A 08/16/2019   Procedure: ABDOMINAL AORTOGRAM W/LOWER EXTREMITY;  Surgeon: Lorretta Harp, MD;  Location: [place] [facility];  Service: Cardiovascular;  Laterality: N/A;   ABDOMINAL AORTOGRAM W/LOWER EXTREMITY Bilateral 12/19/2019   Procedure: ABDOMINAL AORTOGRAM W/LOWER EXTREMITY;  Surgeon: Wellington Hampshire, MD;  Location: [place] [facility];  Service: Cardiovascular;  Laterality: Bilateral;   ABDOMINAL AORTOGRAM W/LOWER EXTREMITY Left 06/10/2020   Procedure: ABDOMINAL AORTOGRAM W/LOWER EXTREMITY;  Surgeon: Serafina Mitchell, MD;  Location: [place] [facility];  Service: Cardiovascular;  Laterality: Left;   ABDOMINAL AORTOGRAM W/LOWER EXTREMITY N/A 10/06/2020   Procedure: ABDOMINAL AORTOGRAM W/LOWER EXTREMITY;  Surgeon: Waynetta Sandy, MD;  Location: [place] [facility];  Service: Cardiovascular;  Laterality: N/A;   AMPUTATION TOE Left 06/13/2020   Procedure: Left Fifth Toe Amputation ;  Surgeon: Criselda Peaches, DPM;  Location: [facility];  Service: Podiatry;  Laterality: Left;   AORTA - BILATERAL FEMORAL ARTERY BYPASS GRAFT Bilateral 10/09/2020   Procedure: AORTA BIFEMORAL BYPASS GRAFT;  Surgeon: Cherre Robins, MD;  Location: [facility] OR;  Service: Vascular;  Laterality: Bilateral;   arm surgery     ENDARTERECTOMY FEMORAL Bilateral 10/09/2020   Procedure:  ENDARTERECTOMY COMMON FEMORAL;  Surgeon: Cherre Robins, MD;  Location: [place];  Service: Vascular;  Laterality: Bilateral;   PERIPHERAL VASCULAR BALLOON ANGIOPLASTY  08/16/2019   Procedure: PERIPHERAL VASCULAR BALLOON ANGIOPLASTY;  Surgeon:  Lorretta Harp, MD;  Location: [place] [facility];  Service: Cardiovascular;;  attempted PTA of Left SFA   PERIPHERAL VASCULAR INTERVENTION Right 07/16/2019   Procedure: PERIPHERAL VASCULAR INTERVENTION;  Surgeon: Lorretta Harp, MD;  Location: [place] [facility];  Service: Cardiovascular;  Laterality: Right;   PERIPHERAL VASCULAR INTERVENTION Right 12/19/2019   Procedure: PERIPHERAL VASCULAR INTERVENTION;  Surgeon: Wellington Hampshire, MD;  Location: [place] [facility];  Service: Cardiovascular;  Laterality: Right;  iliac   PERIPHERAL VASCULAR INTERVENTION Left 06/10/2020   Procedure: PERIPHERAL VASCULAR INTERVENTION;  Surgeon: Serafina Mitchell, MD;  Location: [place] [facility];  Service: Cardiovascular;  Laterality: Left;  SFA /COMMON ILIAC       Family History  Problem Relation Age of Onset   Cancer Mother    Diabetes Father     Social History   Tobacco Use   Smoking status: Every Day    Packs/day: 0.50    Years: 40.00    Pack years: 20.00    Types: Cigarettes   Smokeless tobacco: Never  Vaping Use   Vaping Use: Never used  Substance Use Topics   Alcohol use: Yes    Comment: beer and liquor daily   Drug use: Yes    Frequency: 3.0 times per week    Types: Marijuana    Home Medications Prior to Admission medications   Medication Sig Start Date End Date Taking? Authorizing Provider  acetaminophen (TYLENOL) 325 MG tablet Take 2 tablets (650 mg total) by mouth every 6 (six) hours as needed. 10/14/20   Debbe Odea, MD  aspirin EC 81 MG EC tablet Take 1 tablet (81 mg total) by mouth daily. Swallow whole. Patient taking differently: Take 81 mg by mouth daily at 2 PM. Swallow whole. 07/18/19   Furth, Cadence H, PA-C  atorvastatin (LIPITOR) 40 MG tablet Take 40 mg by mouth daily. 04/06/20   [provider]  Blood Pressure Monitor KIT Check blood pressure daily Patient taking differently: 1 each by Other route once a week. 10/20/18   Jacelyn Pi, Lilia Argue, MD  docusate  sodium (COLACE) 100 MG capsule Take 1 capsule (100 mg total) by mouth 2 (two) times daily. Patient not taking: No sig reported 06/16/20   Lavina Hamman, MD  feeding supplement (ENSURE ENLIVE / ENSURE PLUS) LIQD Take 237 mLs by mouth 2 (two) times daily between meals. 10/14/20   Debbe Odea, MD  gabapentin (NEURONTIN) 600 MG tablet Take 1 tablet (600 mg) in morning and afternoon. Take 2 tablets (1200 mg) at night Patient taking differently: Take 600 mg by mouth See admin instructions. Take 3 tablet (1800 mg) in morning and  2 tablets (1200 mg) at night 06/16/20   Lavina Hamman, MD  lisinopril-hydrochlorothiazide (ZESTORETIC) 20-25 MG tablet Take 1 tablet by mouth daily. 04/16/20   [provider]  Multiple Vitamin (MULTIVITAMIN WITH MINERALS) TABS tablet Take 1 tablet by mouth daily. 10/14/20   Debbe Odea, MD  nicotine (NICODERM CQ - DOSED IN MG/24 HR) 7 mg/24hr patch Place 1 patch (7 mg total) onto the skin daily. Patient not taking: No sig reported 06/17/20   Lavina Hamman, MD  pantoprazole (PROTONIX) 40 MG tablet Take 1 tablet (40 mg total) by mouth daily. 10/14/20  Debbe Odea, MD  SANTYL ointment Apply 1 application topically 2 (two) times daily. 08/15/19   [provider]  traMADol (ULTRAM) 50 MG tablet Take 1 tablet (50 mg total) by mouth every 6 (six) hours as needed for moderate pain. 10/15/20   Dagoberto Ligas, PA-C    Allergies    Patient has no known allergies.  Review of Systems   Review of Systems  Constitutional:  Negative for fever.  HENT:  Negative for congestion and facial swelling.   Respiratory:  Negative for shortness of breath.   Cardiovascular:  Negative for chest pain and palpitations.  Gastrointestinal:  Negative for nausea and vomiting.  Genitourinary:  Negative for difficulty urinating.  Musculoskeletal:  Positive for arthralgias.  Skin:  Negative for rash.  Neurological:  Positive for numbness (tingling). Negative for facial asymmetry.   All other systems reviewed and are negative.  Physical Exam Updated Vital Signs BP (!) 151/93   Pulse 100   Temp 98.5 F (36.9 C) (Oral)   Resp 18   SpO2 100%   Physical Exam Constitutional:      General: He is not in acute distress.    Appearance: He is well-developed. He is not diaphoretic.  HENT:     Head: Normocephalic and atraumatic.  Eyes:     Conjunctiva/sclera: Conjunctivae normal.     Pupils: Pupils are equal, round, and reactive to light.  Cardiovascular:     Rate and Rhythm: Normal rate and regular rhythm.  Pulmonary:     Effort: Pulmonary effort is normal.     Breath sounds: Normal breath sounds. No wheezing or rales.  Abdominal:     General: Bowel sounds are normal.     Palpations: Abdomen is soft.     Tenderness: There is no abdominal tenderness. There is no guarding or rebound.     Comments: Incisions are clean, dry, and intact to abdomen and groin  Musculoskeletal:        General: Normal range of motion.     Cervical back: Normal range of motion and neck supple.     Comments: No palpable distal pulse to left foot, left 5th digit is missing, wound is hemostatic and fibrotic appearing at MTP joint Faint dopplerable pulse right foot  Skin:    General: Skin is warm and dry.  Neurological:     Mental Status: He is alert and oriented to person, place, and time.    ED Results / Procedures / Treatments   Labs (all labs ordered are listed, but only abnormal results are displayed) Labs Reviewed  RESP PANEL BY RT-PCR (FLU A&B, COVID) ARPGX2  CBC WITH DIFFERENTIAL/PLATELET  I-STAT CHEM 8, ED    EKG None  Radiology No results found.  Procedures Procedures   Medications Ordered in ED Medications  oxyCODONE-acetaminophen (PERCOCET/ROXICET) 5-325 MG per tablet 1-2 tablet (1 tablet Oral Not Given 10/28/20 0422)  vancomycin (VANCOCIN) IVPB 1000 mg/200 mL premix (1,000 mg Intravenous New Bag/Given 10/28/20 0631)  piperacillin-tazobactam (ZOSYN) IVPB 3.375 g  (3.375 g Intravenous New Bag/Given 10/28/20 0631)  sodium chloride 0.9 % bolus 500 mL (0 mLs Intravenous Stopped 10/28/20 0632)  fentaNYL (SUBLIMAZE) injection 50 mcg (50 mcg Intravenous Given 10/28/20 0418)  iohexol (OMNIPAQUE) 350 MG/ML injection 100 mL (100 mLs Intravenous Contrast Given 10/28/20 0516)    ED Course  I have reviewed the triage vital signs and the nursing notes.  Pertinent labs & imaging results that were available during my care of the patient were reviewed by me  and considered in my medical decision making (see chart for details).    MDM Rules/Calculators/A&P                           535 Case d/w Dr. Donzetta Matters of vascular who will see the patient in consult  Final Clinical Impression(s) / ED Diagnoses Final diagnoses:  None   Admit to medicine  Rx / DC Orders ED Discharge Orders     None        Mollee Neer, MD 10/28/20 [phone]

## 2020-10-28 NOTE — ED Notes (Signed)
Pt given diet ginger ale and crackers

## 2020-10-28 NOTE — ED Provider Notes (Signed)
545 Admitted to Dr. Bridgett Larsson of [facility] who will admit the patient    Ruben Kells, MD 10/28/20 [phone]

## 2020-10-28 NOTE — ED Notes (Signed)
Patient returned from CT

## 2020-10-29 DIAGNOSIS — I70322 Atherosclerosis of unspecified type of bypass graft(s) of the extremities with rest pain, left leg: Secondary | ICD-10-CM | POA: Diagnosis not present

## 2020-10-29 LAB — CBC
HCT: 26.7 % — ABNORMAL LOW (ref 39.0–52.0)
Hemoglobin: 8.3 g/dL — ABNORMAL LOW (ref 13.0–17.0)
MCH: 28.5 pg (ref 26.0–34.0)
MCHC: 31.1 g/dL (ref 30.0–36.0)
MCV: 91.8 fL (ref 80.0–100.0)
Platelets: 386 10*3/uL (ref 150–400)
RBC: 2.91 MIL/uL — ABNORMAL LOW (ref 4.22–5.81)
RDW: 16.9 % — ABNORMAL HIGH (ref 11.5–15.5)
WBC: 6.2 10*3/uL (ref 4.0–10.5)
nRBC: 0 % (ref 0.0–0.2)

## 2020-10-29 LAB — BASIC METABOLIC PANEL
Anion gap: 6 (ref 5–15)
BUN: 12 mg/dL (ref 8–23)
CO2: 29 mmol/L (ref 22–32)
Calcium: 9 mg/dL (ref 8.9–10.3)
Chloride: 102 mmol/L (ref 98–111)
Creatinine, Ser: 0.89 mg/dL (ref 0.61–1.24)
GFR, Estimated: >60 mL/min (ref >60)
Glucose, Bld: 105 mg/dL — ABNORMAL HIGH (ref 70–99)
Potassium: 3.8 mmol/L (ref 3.5–5.1)
Sodium: 137 mmol/L (ref 135–145)

## 2020-10-29 MED ORDER — ENSURE ENLIVE PO LIQD
237.0000 mL | Freq: Three times a day (TID) | ORAL | Status: DC
  Administered 2020-10-29: 237 mL via ORAL

## 2020-10-29 MED ORDER — TRAMADOL HCL 50 MG PO TABS
50.0000 mg | ORAL_TABLET | Freq: Four times a day (QID) | ORAL | Status: DC | PRN
  Administered 2020-10-29: 50 mg via ORAL
  Filled 2020-10-29: qty 1

## 2020-10-29 MED ORDER — COLLAGENASE 250 UNIT/GM EX OINT
TOPICAL_OINTMENT | Freq: Every day | CUTANEOUS | Status: DC
  Filled 2020-10-29: qty 30

## 2020-10-29 MED ORDER — COLLAGENASE 250 UNIT/GM EX OINT: TOPICAL_OINTMENT | Freq: Every day | CUTANEOUS | 0 refills | Status: DC

## 2020-10-29 MED ORDER — TRAMADOL HCL 50 MG PO TABS: 50.0000 mg | ORAL_TABLET | Freq: Four times a day (QID) | ORAL | 0 refills | Status: DC | PRN

## 2020-10-29 MED ORDER — ADULT MULTIVITAMIN W/MINERALS CH
1.0000 | ORAL_TABLET | Freq: Every day | ORAL | Status: DC
  Administered 2020-10-29: 1 via ORAL
  Filled 2020-10-29: qty 1

## 2020-10-29 MED ORDER — OXYCODONE-ACETAMINOPHEN 5-325 MG PO TABS
1.0000 | ORAL_TABLET | Freq: Once | ORAL | Status: AC
  Administered 2020-10-29: 1 via ORAL
  Filled 2020-10-29: qty 1

## 2020-10-29 NOTE — Evaluation (Signed)
Physical Therapy Evaluation Patient Details Name: [name]. MRN: [id number] DOB: 28-May-1960 Today's Date: 10/29/2020  History of Present Illness  The pt is a 60 yo male presenting 10/05/20 with c/o LLE pain following recent L pinky toe amputation on 5/20. Upon work-up pt LLE found to be cold to the touch with no palpable pulses. Pt s/p ultrasound-guided cannulation of R CFA on 9/12, now s/p aorto-bi-femoral bypass with bilateral endarterectomies on 9/15. PMH includes: DM II, PAD, R SFA arthrectomy and balloon angioplasty 6/21, stenting of L SFA and L CIA 5/22, HLD, HTN, current tobacco and marijuana use.   Clinical Impression  Pt presenting with increased L foot pain limiting L LE Wbing tolerance limiting pt's ambulation tolerance. Pt reports using cane at home but after using RW with PT agreed with PT that a RW would be safer as pt very dependent on bilat UEs to minimize L foot WBIng due to pain. Unsure of medical plan, PT to cont to follow to progress mobility.       Recommendations for follow up therapy are one component of a multi-disciplinary discharge planning process, led by the attending physician.  Recommendations may be updated based on patient status, additional functional criteria and insurance authorization.  Follow Up Recommendations Home health PT;Supervision for mobility/OOB    Equipment Recommendations  Rolling walker with 5" wheels    Recommendations for Other Services       Precautions / Restrictions Precautions Precautions: Fall Restrictions Weight Bearing Restrictions: No      Mobility  Bed Mobility Overal bed mobility: Modified Independent Bed Mobility: Supine to Sit     Supine to sit: HOB elevated;Modified independent (Device/Increase time)     General bed mobility comments: HOB elevated, no use of bed rail    Transfers Overall transfer level: Needs assistance Equipment used: Rolling walker (2 wheeled) Transfers: Sit to/from Stand Sit to  Stand: Supervision         General transfer comment: pt with minimal L LE WBing, increased bilat UE WBing to off weight L LE  Ambulation/Gait Ambulation/Gait assistance: Min guard Gait Distance (Feet): 300 Feet Assistive device: Rolling walker (2 wheeled) Gait Pattern/deviations: Step-to pattern;Antalgic;Decreased weight shift to left;Trunk flexed Gait velocity: Decreased Gait velocity interpretation: <1.31 ft/sec, indicative of household ambulator General Gait Details: Slow, antalgic gait with RW, minimal L LE WBing, educated pt to try to only WB on heel however pt continued with limited tolerance, pt very dependent on UEs  Stairs            Wheelchair Mobility    Modified Rankin (Stroke Patients Only)       Balance Overall balance assessment: Needs assistance Sitting-balance support: No upper extremity supported;Feet supported Sitting balance-Leahy Scale: Good     Standing balance support: Bilateral upper extremity supported;No upper extremity supported;Single extremity supported Standing balance-Leahy Scale: Fair Standing balance comment: Can static stand without UE support                             Pertinent Vitals/Pain Pain Assessment: 0-10 Pain Score: 8  Pain Location: L foot Pain Descriptors / Indicators: Discomfort;Shooting;Burning Pain Intervention(s): Patient requesting pain meds-RN notified    Home Living Family/patient expects to be discharged to:: Private residence Living Arrangements: Non-relatives/Friends Available Help at Discharge: Friend(s);Available 24 hours/day Type of Home: Apartment Home Access: Stairs to enter Entrance Stairs-Rails: Can reach both Entrance Stairs-Number of Steps: flight of stairs Home Layout: One level Home  Equipment: [name] - single point Additional Comments: pt lives in an apartment and brother lives in single story home with level entry one level    Prior Function Level of Independence: Independent with  assistive device(s)         Comments: independent with use of cane, not driving, hasn't been walking much since recent surgery     Hand Dominance   Dominant Hand: Right    Extremity/Trunk Assessment   Upper Extremity Assessment Upper Extremity Assessment: Overall WFL for tasks assessed    Lower Extremity Assessment Lower Extremity Assessment: LLE deficits/detail LLE Deficits / Details: pt with minimal ankle ROM due to onset of pain, able bend at hip and knee    Cervical / Trunk Assessment Cervical / Trunk Assessment: Normal  Communication   Communication: No difficulties  Cognition Arousal/Alertness: Awake/alert Behavior During Therapy: WFL for tasks assessed/performed Overall Cognitive Status: Within Functional Limits for tasks assessed                                        General Comments General comments (skin integrity, edema, etc.): Pt with healing L pinky toe incision    Exercises     Assessment/Plan    PT Assessment Patient needs continued PT services  PT Problem List Decreased range of motion;Decreased activity tolerance;Decreased strength;Decreased balance;Decreased mobility;Pain;Impaired sensation       PT Treatment Interventions DME instruction;Gait training;Stair training;Therapeutic activities;Functional mobility training;Therapeutic exercise;Balance training;Patient/family education    PT Goals (Current goals can be found in the Care Plan section)  Acute Rehab PT Goals Patient Stated Goal: return home, decrease pain PT Goal Formulation: With patient Time For Goal Achievement: 11/12/20 Potential to Achieve Goals: Good    Frequency Min 3X/week   Barriers to discharge Inaccessible home environment pt with 16 seps to enter    Co-evaluation               AM-PAC PT "6 Clicks" Mobility  Outcome Measure Help needed turning from your back to your side while in a flat bed without using bedrails?: None Help needed moving  from lying on your back to sitting on the side of a flat bed without using bedrails?: None Help needed moving to and from a bed to a chair (including a wheelchair)?: A Little Help needed standing up from a chair using your arms (e.g., wheelchair or bedside chair)?: A Little Help needed to walk in hospital room?: A Little Help needed climbing 3-5 steps with a railing? : A Little 6 Click Score: 20    End of Session Equipment Utilized During Treatment: Gait belt Activity Tolerance: Patient tolerated treatment well Patient left: with call bell/phone within reach;in chair;with chair alarm set Nurse Communication: Mobility status PT Visit Diagnosis: Other abnormalities of gait and mobility (R26.89);Pain;History of falling (Z91.81);Unsteadiness on feet (R26.81);Difficulty in walking, not elsewhere classified (R26.2) Pain - Right/Left: Left Pain - part of body: Ankle and joints of foot    Time: [id number] PT Time Calculation (min) (ACUTE ONLY): 22 min   Charges:   PT Evaluation $PT Eval Moderate Complexity: 1 Mod          Kittie Plater, PT, DPT Acute Rehabilitation Services Pager #: [phone] Office #: [phone]   Berline Lopes 10/29/2020, 12:36 PM

## 2020-10-29 NOTE — Care Management CC44 (Signed)
Condition Code 44 Documentation Completed  Patient Details  Name: [name]. MRN: [id number] Date of Birth: 23-May-1960   Condition Code 44 given:  Yes Patient signature on Condition Code 44 notice:  Yes Documentation of 2 MD's agreement:  Yes Code 44 added to claim:  Yes    Cyndi Bender, RN 10/29/2020, 1:57 PM

## 2020-10-29 NOTE — Progress Notes (Signed)
Orthopedic Tech Progress Note Patient Details:  [name]. [birth year] [id number]  Ortho Devices Type of Ortho Device: Postop shoe/boot Ortho Device/Splint Interventions: Ordered   Post Interventions Instructions Provided: Adjustment of device  Lisbeth Puller E Sorin Frimpong 10/29/2020, 1:39 PM

## 2020-10-29 NOTE — Progress Notes (Signed)
Initial Nutrition Assessment  DOCUMENTATION CODES:   Severe malnutrition in context of chronic illness  INTERVENTION:   Ensure Enlive/Plus po TID, each supplement provides 350 kcal and 13-20 grams of protein. MVI with minerals daily.  NUTRITION DIAGNOSIS:   Severe Malnutrition related to chronic illness (PVD) as evidenced by severe muscle depletion, severe fat depletion.  GOAL:   Patient will meet greater than or equal to 90% of their needs  MONITOR:   PO intake, Supplement acceptance, Skin  REASON FOR ASSESSMENT:   Consult Wound healing  ASSESSMENT:   60 yo male admitted with L foot pain. PMH includes DM, HTN, HLD, PVD s/p recent aorta bifemoral bypass graft for SFA occlusion, cocaine abuse, tobacco use.  Patient reports usual weight was 125 lbs ~3 months ago and he's down to ~109 lbs now. He states intake at home has been pretty good. He tries to eat three meals per day. He likes chocolate Ensure; agreed to drink 3 bottles per day while in the hospital. Will also add MVI with minerals to promote wound healing.   Labs and medications reviewed.   Weight history reviewed. Weight has been stable at ~46 kg for the past 3-4 months. One year ago, he weighed 49 kg. 6% weight loss within a year is not significant for the time frame. Patient's stated weight trend is not congruent with weight readings recorded in EMR.   Patient meets criteria for severe malnutrition with severe depletion of muscle and subcutaneous fat mass.   NUTRITION - FOCUSED PHYSICAL EXAM:  Flowsheet Row Most Recent Value  Orbital Region Moderate depletion  Upper Arm Region Severe depletion  Thoracic and Lumbar Region Severe depletion  Buccal Region Severe depletion  Temple Region Severe depletion  Clavicle Bone Region Severe depletion  Clavicle and Acromion Bone Region Severe depletion  Scapular Bone Region Severe depletion  Dorsal Hand Moderate depletion  Patellar Region Unable to assess  Anterior  Thigh Region Unable to assess  Posterior Calf Region Moderate depletion  Edema (RD Assessment) None  Hair Reviewed  Eyes Reviewed  Mouth Reviewed  Skin Reviewed  Nails Reviewed       Diet Order:   Diet Order             Diet - low sodium heart healthy           Diet - low sodium heart healthy           Diet Heart Room service appropriate? Yes; Fluid consistency: Thin  Diet effective now                   EDUCATION NEEDS:   Education needs have been addressed  Skin:  Skin Assessment: Reviewed RN Assessment  Last BM:  10/5  Height:   Ht Readings from Last 1 Encounters:  10/29/20 '5\' 1"'$  (1.549 m)    Weight:   Wt Readings from Last 1 Encounters:  10/29/20 45.9 kg     BMI:  Body mass index is 19.12 kg/m.  Estimated Nutritional Needs:   Kcal:  1700-1900  Protein:  85-95 gm  Fluid:  1.5-1.7 L    Lucas Mallow, RD, LDN, CNSC Please refer to Amion for contact information.

## 2020-10-29 NOTE — Plan of Care (Signed)
  Problem: Health Behavior/Discharge Planning: Goal: Ability to manage health-related needs will improve Outcome: Not Progressing   

## 2020-10-29 NOTE — TOC Transition Note (Signed)
Transition of Care [facility]) - CM/SW Discharge Note   Patient Details  Name: [name]. MRN: [id number] Date of Birth: 1960-08-06  [facility] of [facility] CM/SW Contact:  Cyndi Bender, RN Phone Number: 10/29/2020, 2:44 PM   Clinical Narrative:   Patient stable for discharge. Orders for Home PT and wound care clinic referral. Patient requested an morning apt. Made wound care clinic apt 12/11/20 at [place]. Patient stated he will have a ride to the apt.  Choice offered for Home Health with list provide Per CMS guidelines from medicare.gov website with star ratings (copy placed in shadow chart) Patient deferred to CM to secure agency. Spoke to [place] at [place] and Home health Pt referral excepted.   Final next level of care: [place] Barriers to Discharge: Barriers Resolved   Patient Goals and CMS Choice Patient states their goals for this hospitalization and ongoing recovery are:: return home CMS Medicare.gov Compare Post Acute Care list provided to:: Patient Choice offered to / list presented to : Patient  Discharge Placement               Home with [facility] PT        Discharge Plan and Services   Discharge Planning Services: CM Consult Post Acute Care Choice: Home Health                    HH Arranged: PT [place]: [place] Date [facility] Agency Contacted: 10/29/20 Time HH Agency Contacted: 1430 Representative spoke with at [place]: [place] ([place]) Interventions     Readmission Risk Interventions Readmission Risk Prevention Plan 10/29/2020 10/14/2020 06/16/2020  Post Dischage Appt - - Complete  Medication Screening - - Complete  Transportation Screening Complete Complete Complete  PCP or Specialist Appt within 5-7 Days - Not Complete -  Not Complete comments - per vascular post op appointment 11/04/2020 -  PCP or Specialist Appt within 3-5 Days Complete - -  Home Care Screening - Complete -  Medication  Review (RN CM) - Complete -  HRI or Home Care Consult Complete - -  Social Work Consult for [place] Planning/Counseling Complete - -  Palliative Care Screening Not Applicable - -  Medication Review (RN Care Manager) Complete - -  Some recent data might be hidden

## 2020-10-29 NOTE — Care Management Obs Status (Signed)
[place] NOTIFICATION   Patient Details  Name: [name]. MRN: [id number] Date of Birth: 08/09/1960   Medicare Observation Status Notification Given:  Yes    Cyndi Bender, RN 10/29/2020, 1:57 PM

## 2020-10-29 NOTE — Progress Notes (Addendum)
[facility]  Subjective  - pain in the left LE, pain tablets help some.   Objective 120/85 93 98.4 F (36.9 C) (Oral) 16 100%  Intake/Output Summary (Last 24 hours) at 10/29/2020 0708 Last data filed at 10/29/2020 0534 Gross per 24 hour  Intake 2182.13 ml  Output 800 ml  Net 1382.13 ml    Doppler DP/AT signal left LE  Left 5th toe amp site appears to be healing Lungs non labored breathing Heart RRR  Assessment/Planning: Aorta Bifemoral bypass Non-Invasive Vascular Imaging:   CTA IMPRESSION: VASCULAR   1. 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 Reconstituted left popliteal artery.   2. Bilateral lower extremity peripheral vascular disease with only Single-vessel Runoff on both sides. Asymmetrically decreased enhancement of small arteries of the left foot.   3. Right SFA is patent although with tandem high-grade stenoses. Native external iliac arteries remain patent although with bilateral high-grade stenoses.   4. 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  If it fails to heal he may require left common femoral to below-knee popliteal artery bypass.    He has a scheduled f/u appt. With [facility]  Roxy Horseman 10/29/2020 7:08 AM --  Laboratory Lab Results: Recent Labs    10/28/20 0345 10/29/20 0206  WBC 5.8 6.2  HGB 9.7* 8.3*  HCT 30.6* 26.7*  PLT 501* 386   BMET Recent Labs    10/28/20 0345 10/29/20 0206  NA 141 137  K 3.8 3.8  CL 101 102  CO2 29 29  GLUCOSE 118* 105*  BUN 10 12  CREATININE 0.73 0.89  CALCIUM 9.5 9.0    COAG Lab Results  Component Value Date   INR 1.1 10/09/2020   INR 1.1 10/05/2020   INR 1.0 06/06/2020   No results found for: PTT   VASCULAR STAFF ADDENDUM: I have  independently interviewed and examined the patient. I agree with the above.  LLE painful about the foot. This appears to be mild ulceration at previous toe amputation site. In-line flow to the foot would be ideal in this situation, but a femoral-popliteal artery bypass would be necessary to achieve this. Re-opening his groin incision at this point in time would be highest risk for wound problems and exposure injuries. I would recommend a trial of local wound care for the next 3-4 weeks. If this has not healed by then, we will re-evaluate and plan for femoral popliteal bypass grafting at that time.  [name], MD [facility] Phone Number: [phone] 10/29/2020 11:03 AM

## 2020-10-29 NOTE — Progress Notes (Signed)
Patient given discharge instructions, medication list and follow up appointments. Patient verbalized understanding, patient prescriptions sent to personal pharmacy. IV and tele were dcd. Will discharge home as ordered. Transported to exit via wheel chair and nursing staff. Mozetta Murfin, Bettina Gavia RN

## 2020-10-29 NOTE — Discharge Instructions (Signed)
Weightbearing as tolerated to left lower extremity with surgical shoes Weightbearing as tolerated to right lower extremity Santyl to left small toe amputation site daily wtd

## 2020-10-29 NOTE — Discharge Summary (Addendum)
Physician Discharge Summary  [name]. WUJ:[id number] DOB: 1960-10-27 DOA: 10/28/2020  PCP: Cipriano Mile, NP  Admit date: 10/28/2020 Discharge date: 10/29/2020  Admitted From: Home Disposition: Home  Recommendations for Outpatient Follow-up:  Follow up with PCP in 1-2 weeks Please obtain BMP/CBC in one week Please follow up with Dr. March Rummage  Home Health: yes Equipment/Devices: Left foot postop shoe  Discharge Condition: Stable CODE STATUS: Full code Diet recommendation: Cardiac diet Brief/Interim Summary: 60 year old male with a history of aortofemoral bypass after the left SFA stenting and occlusion now admitted with pain at the site of the left fifth toe amputation site.  He denies any fever chills denies any drainage from that site.  Continues to complain of pain at the site. Patient had amputation of the fifth toe on the left foot on 06/13/2020,with Aorto  bifemoral bypass left and right femoral endarterectomy on 10/09/2020.   Discharge Diagnoses:  Principal Problem:   Critical limb ischemia of left lower extremity with bypass graft (HCC) Active Problems:   Essential hypertension   Pure hypercholesterolemia   Type 2 diabetes mellitus without complication, without long-term current use of insulin (HCC)   Tobacco use disorder   Marijuana abuse  Left foot ulcer seen by vascular surgery and podiatry.  They do not recommend any surgical interventions at this time.  X-ray of the left foot did not reveal any evidence of osteomyelitis.  They did not recommend patient to be on antibiotics.  He will be discharged to follow-up with Dr. March Rummage.  He is weightbearing as tolerated to left lower extremity in surgical shoe and weightbearing as tolerated to right lower extremity.  He recommended Santyl wet-to-dry dressings to the left small toe amputation site.  We will also set up for wound clinic appointments on discharge.  Patient will need to follow-up with podiatry and vascular  surgery. They recommended a trial of local wound care for 3 to 4 weeks, if not healed by then he will need reevaluation for perform femoral-popliteal bypass grafting. He was seen by physical therapy recommending home health PT.  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  He is not on any medications at home.  He is only on Neurontin.  History of essential hypertension on Zestoretic  Hyperlipidemia on Lipitor  Tobacco abuse cessation encouraged.  Marijuana abuse cessation encouraged.   Estimated body mass index is 19.12 kg/m as calculated from the following:   Height as of this encounter: '5\' 1"'  (1.549 m).   Weight as of this encounter: 45.9 kg.  Discharge Instructions  Discharge Instructions     Diet - low sodium heart healthy   Complete by: As directed    Increase activity slowly   Complete by: As directed       Allergies as of 10/29/2020   No Known Allergies      Medication List     STOP taking these medications    feeding supplement Liqd       TAKE these medications    acetaminophen 325 MG tablet Commonly known as: TYLENOL Take 2 tablets (650 mg total) by mouth every 6 (six) hours as needed.   aspirin 81 MG EC tablet Take 1 tablet (81 mg total) by mouth daily. Swallow whole. What changed: when to take this   atorvastatin 40 MG tablet Commonly known as: LIPITOR Take 40 mg by mouth daily.   Blood Pressure Monitor Kit Check blood pressure daily What changed:  how much to take how to take this when to take this additional instructions   collagenase ointment Commonly known as: SANTYL Apply topically daily. What changed:  how much to take when to take this   gabapentin 600 MG tablet Commonly known as: NEURONTIN Take 1 tablet (600 mg) in morning and afternoon. Take 2 tablets (1200 mg) at night What changed:  how much to take how to take this when to take  this additional instructions   lisinopril-hydrochlorothiazide 20-25 MG tablet Commonly known as: ZESTORETIC Take 1 tablet by mouth daily.   multivitamin with minerals Tabs tablet Take 1 tablet by mouth daily.   pantoprazole 40 MG tablet Commonly known as: PROTONIX Take 1 tablet (40 mg total) by mouth daily.   traMADol 50 MG tablet Commonly known as: ULTRAM Take 1 tablet (50 mg total) by mouth every 6 (six) hours as needed for moderate pain.        Follow-up Information     Cipriano Mile, NP .   Contact information: [place] [address] [phone]         Evelina Bucy, DPM Follow up.   Specialty: Podiatry Contact information: [place] [address] [phone]                No Known Allergies  Consultations: Podiatry and vascular   Procedures/Studies: CT Angio Aortobifemoral W and/or Wo Contrast  Result Date: 10/28/2020 CLINICAL DATA:  60 year old male with history of a left 5th foot toe amputation several months ago. Increased lower extremity pain. No known injury. EXAM: CT ANGIOGRAPHY OF ABDOMINAL AORTA WITH ILIOFEMORAL RUNOFF TECHNIQUE: Multidetector CT imaging of the abdomen, pelvis and lower extremities was performed using the standard protocol during bolus administration of intravenous contrast. Multiplanar CT image reconstructions and MIPs were obtained to evaluate the vascular anatomy. CONTRAST:  146m OMNIPAQUE IOHEXOL 350 MG/ML SOLN COMPARISON:  CTA abdomen and pelvis 10/17/2020. 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 Sequelae of aortobifemoral bypass. Proximal to the distal aortic graft the abdominal aorta demonstrates circumferential soft plaque. The celiac, SMA and main renal arteries remain patent. There is chronic proximal SMA stenosis (series 5, image 59) which  is stable. 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 Patent bypass to the main femoral arteries. Stented bilateral native Common iliac arteries with reconstituted enhancement. And the right external iliac is stented and patent, although with high-grade stenosis distal to the stent (series 5, image 147). Long segment high-grade stenosis of the left external iliac artery which also remains patent. RIGHT Lower Extremity Postoperative changes at the right Common femoral artery with superimposed soft plaque and narrowing of the femoral bifurcation. The right SFA and PFA remain patent. Atherosclerosis and stenosis of the right distal SFA with high-grade tandem stenoses (such as series 5, image 292). But the right popliteal artery remains patent. Right calf atherosclerosis with primarily 1 vessel runoff, peroneal artery. Intermittent calcified plaque. Reconstituted arterial enhancement in the right foot. LEFT Lower Extremity Postoperative changes to the left Common femoral artery with patent bypass anastomosis but chronically thrombosed left SFA at the bifurcation. Thrombosed left SFA stent which continues distally toward the knee. The left PFA remains patent. Reconstituted left popliteal artery. Calf atherosclerosis with primarily 1 vessel left lower extremity runoff, peroneal artery which continues to the dorsalis pedis. Asymmetrically decreased left foot arterial enhancement. Review of the MIP images confirms the  above findings. NON-VASCULAR Lower chest: No cardiomegaly. No pericardial or pleural effusion. Mild lower lobe bronchiectasis. Mildly lower lung volumes. Hepatobiliary: Contracted gallbladder.  Liver is stable. Pancreas: Negative. Spleen: Negative. Adrenals/Urinary Tract: Negative. Stomach/Bowel: No dilated large or small bowel. Retained stool throughout the colon. Occasional colonic diverticula. No free air, free fluid, or mesenteric inflammation identified. Lymphatic: No lymphadenopathy. Reproductive: Negative. Other:  No pelvic free fluid. Musculoskeletal: Chronic lower lumbar spine degeneration. Left 5th phalanges are surgically absent. No acute osseous abnormality identified. No knee joint effusion identified. IMPRESSION: VASCULAR 1. 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 Reconstituted left popliteal artery. 2. Bilateral lower extremity peripheral vascular disease with only Single-vessel Runoff on both sides. Asymmetrically decreased enhancement of small arteries of the left foot. 3. Right SFA is patent although with tandem high-grade stenoses. Native external iliac arteries remain patent although with bilateral high-grade stenoses. 4. 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 NON-VASCULAR No superimposed acute or inflammatory process identified in the abdomen or pelvis. Electronically Signed   By: Genevie Ann M.D.   On: 10/28/2020 05:51   CT ANGIO AO+BIFEM W & OR WO CONTRAST  Result Date: 10/05/2020 CLINICAL DATA:  Concern for arterial embolism and lower extremity arterial occlusion. EXAM: CT ANGIOGRAPHY OF ABDOMINAL AORTA WITH ILIOFEMORAL RUNOFF TECHNIQUE: Multidetector CT imaging of the abdomen, pelvis and lower extremities was performed using the standard protocol during bolus administration of intravenous contrast. Multiplanar CT image reconstructions and MIPs were obtained to evaluate the vascular anatomy. CONTRAST:  186m OMNIPAQUE IOHEXOL 350 MG/ML SOLN COMPARISON:  None. FINDINGS: VASCULAR Aorta: Moderate atherosclerotic calcification of the abdominal aorta. No aneurysmal dilatation or dissection. No periaortic fluid collection. 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 Celiac: The celiac artery and its major branches are patent. SMA: There is diffuse thickening of the wall of the  proximal SMA with luminal narrowing. There is a focal area of high-grade luminal narrowing in the proximal SMA with greater than 50% luminal narrowing (37/4). The SMA remains patent. Renals: Both renal arteries are patent without evidence of aneurysm, dissection, vasculitis, fibromuscular dysplasia or significant stenosis. IMA: Patent without evidence of aneurysm, dissection, vasculitis or significant stenosis. RIGHT Lower Extremity Inflow: Advanced atherosclerotic calcification of the right common iliac artery with approximately 50% luminal narrowing. A stent is noted extending from the common iliac artery into the right external iliac artery. The stent is patent. There is atherosclerotic calcification of the distal right external iliac artery with luminal narrowing. The external iliac artery however remains patent. There is atherosclerotic calcification of the right internal iliac artery with severe luminal narrowing and diminished flow. Outflow: Atherosclerotic calcification of the common femoral artery with luminal narrowing. The common femoral artery remains patent. The profundus femoris is patent. There is advanced atherosclerotic calcification of the superficial femoral artery with luminal narrowing. The SFA is patent. Popliteal artery is also patent. Runoff: There is minimal flow in the anterior tibial artery. There is reconstitution of flow in the dorsalis pedis artery. The posterior tibial artery and plantar arteries are patent. Very diminished flow in the fibular artery. LEFT Lower Extremity Inflow: There is advanced atherosclerotic calcification of the left common iliac artery with severe luminal narrowing, greater than 50%. A stent is noted in the distal left common iliac artery which appears patent. Advanced atherosclerotic calcification of the left internal and  external iliac arteries. There is minimal flow in the left internal iliac artery. There is moderate narrowing of the left external iliac  artery. The left external iliac artery remains patent. Outflow: There is atherosclerotic calcification and narrowing of the left common femoral artery. The common femoral arteries patent. The profundus femoris is patent. There is a stent in the left superficial femoral artery which is completely occluded. There is atherosclerotic plaque with wall thickening of the left popliteal artery and greater than 50% luminal narrowing. The popliteal artery remains patent but with diminished flow. Runoff: The anterior tibial artery and dorsalis pedis artery are patent. The fibular artery is patent to the mid calf. The posterior tibial artery is not opacified, likely occluded. There is reconstitution of minimal flow in the plantar artery. Veins: No obvious venous abnormality within the limitations of this arterial phase study. Review of the MIP images confirms the above findings. NON-VASCULAR Lower chest: The visualized lung bases are clear. No intra-abdominal free air or free fluid. Hepatobiliary: The liver is unremarkable. No intrahepatic biliary ductal dilatation. The gallbladder is unremarkable. Pancreas: Pancreas is unremarkable as visualized. Spleen: Normal in size without focal abnormality. Adrenals/Urinary Tract: The adrenal glands are unremarkable. The kidneys, visualized ureters, and urinary bladder appear unremarkable. Stomach/Bowel: There is no bowel obstruction or active inflammation. The appendix is normal. Lymphatic: No adenopathy. Reproductive: Enlarged prostate gland. Other: None Musculoskeletal: Degenerative changes of the spine. No acute osseous pathology. IMPRESSION: 1. Advanced atherosclerotic calcification of the aorta and its major branches as described above. There is focal high-grade luminal narrowing of the proximal SMA. 2. Atherosclerotic calcification and narrowing of the iliac arteries. Patent bilateral common iliac artery stents. 3. Complete occlusion of the stent in the left superficial femoral  artery. 4. Patent right posterior tibial artery. Very diminished flow in the anterior tibial artery and fibular artery of the right lower extremity. 5. Patent left anterior tibial artery and dorsalis pedis artery. Non opacification of the left posterior tibial artery. 6. 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 Electronically Signed   By: Anner Crete M.D.   On: 10/05/2020 21:16   PERIPHERAL VASCULAR CATHETERIZATION  Result Date: 10/06/2020 Images from the original result were not included. Patient name: [name]. MRN: [id number] DOB: 08/10/1960 Sex: male 10/06/2020 Pre-operative Diagnosis: Chronic limb threatening ischemia left lower extremity with rest pain Post-operative diagnosis:  Same Surgeon:  Eda Paschal. Donzetta Matters, MD Procedure Performed: 1.  Ultrasound-guided cannulation right common femoral artery 2.  Aortogram with bilateral lower extremity runoff 3.  Moderate sedation with fentanyl and Versed for 20 minutes Indications: 60 year old male with previous gangrenous changes of left fifth toe which has now been amputated following stenting of his left common external leg arteries in his left SFA.  He is now here with rest pain of his left lower extremity that has been present for 1 week.  He had CT angio which demonstrates severe disease of his left common and external neck arteries as well as his common femoral arteries bilaterally and occluded left SFA stenting.  He is indicated for angiography with possible intervention. Findings: His aorta is free of flow-limiting stenosis.  Bilateral common iliac arteries are stenosed the left side approximately 80%.  There is a stent crossing his hypogastric artery which does appear to have disease flow-limiting approximately 50%.  There is a stent in the right external leg artery with disease flow-limiting approximately 50%.  Bilateral common femoral arteries are  diseased with at least 30%  stenosis.  On the right side the SFA is quite diminutive he has runoff in line via the peroneal and posterior tibial arteries.  On the left side is common femoral artery again is disease there is an occluded SFA stent just after 1 cm after the takeoff.  Profunda femoris artery is patent.  He reconstitutes very diminutive popliteal artery on the left at the level of the knee with single-vessel peroneal artery runoff on the left side. Patient will be scheduled for aortobifemoral bypass for chronic limb threatening ischemia.  Procedure:  The patient was identified in the holding area and taken to room 8.  The patient was then placed supine on the table and prepped and draped in the usual sterile fashion.  A time out was called.  Ultrasound was used to evaluate the right common femoral artery.  This was disease.  We cannulated with micropuncture needle followed by wire and sheath.  Images saved the permanent record.  We placed a Bentson wire followed by 5 [place] sheath.  We administered moderate sedation with fentanyl and Versed and his vital signs were monitored throughout the case.  Pigtail catheter was placed to the level of L1 aortogram performed.  We then pulled down to the bifurcation performed bilateral lower extremity runoff.  With the above findings we elected for surgical intervention.  Catheter was removed over wire.  Sheath will be pulled in postoperative holding.  He tolerated procedure without any complication. Contrast: 97cc Brandon C. Donzetta Matters, MD [facility] Office: [phone] Pager: [phone]   NM Myocar Multi W/Spect Tamela Oddi Motion / EF  Result Date: 10/07/2020   Findings are consistent with no ischemia. The study is low risk.   No ST deviation was noted.   LV perfusion is abnormal. There is no evidence of ischemia. Defect 1: There is a medium defect with mild reduction in uptake present in the apical to basal inferior, inferolateral and apex location(s) that is  fixed. There is normal wall motion in the defect area. Consistent with artifact caused by bowel tracer uptake.   Left ventricular function is normal. The left ventricular ejection fraction is normal (55-65%). End diastolic cavity size is normal. End systolic cavity size is normal. Significant artifact from extracardiac/bowel activity, but images improve with stress. Not consistent with ischemia. Low risk study.   DG Chest Portable 1 View  Result Date: 10/28/2020 CLINICAL DATA:  59 year old male with history of left foot amputation 6 months ago. EXAM: PORTABLE CHEST 1 VIEW COMPARISON:  Chest x-ray 10/09/2020. FINDINGS: Lung volumes are normal. No consolidative airspace disease. No pleural effusions. No pneumothorax. No pulmonary nodule or mass noted. Pulmonary vasculature and the cardiomediastinal silhouette are within normal limits. IMPRESSION: No radiographic evidence of acute cardiopulmonary disease. Electronically Signed   By: Vinnie Langton M.D.   On: 10/28/2020 07:01   DG Chest Port 1 View  Result Date: 10/09/2020 CLINICAL DATA:  Status post vascular surgical bypass and placement central line. EXAM: PORTABLE CHEST 1 VIEW COMPARISON:  05/10/2020 FINDINGS: Right jugular central line present with the catheter tip located in the lower SVC. The heart size and mediastinal contours are within normal limits. There is no evidence of pulmonary edema, consolidation, pneumothorax or pleural fluid. The visualized skeletal structures are unremarkable. IMPRESSION: Central line tip in SVC.  No pneumothorax or other acute findings. Electronically Signed   By: Aletta Edouard M.D.   On: 10/09/2020 12:45   DG Foot Complete Left  Result Date: 10/28/2020 CLINICAL DATA:  60 year old male with history of amputation of the left fifth toe 6 months ago. Evaluate for osteomyelitis. EXAM: LEFT FOOT - COMPLETE 3+ VIEW COMPARISON:  10/05/2020. FINDINGS: Status post amputation of the fifth digit beyond the metatarsal-phalangeal  joint. No destructive osseous lesions are identified on today's plain film examination. Small sclerotic lesion in the calcaneus with narrow zone of transition, similar to the prior study, likely a bone island. No acute displaced fracture, subluxation or dislocation. IMPRESSION: 1. No acute findings. Specifically, no definite imaging findings to indicate osteomyelitis. 2. Status post amputation of the fifth toe beyond the metatarsal-phalangeal joint. Electronically Signed   By: Vinnie Langton M.D.   On: 10/28/2020 07:01   DG Foot Complete Left  Result Date: 10/05/2020 CLINICAL DATA:  Generalized left foot pain, erythema for several days EXAM: LEFT FOOT - COMPLETE 3+ VIEW COMPARISON:  06/13/2020 FINDINGS: Frontal, oblique, and lateral views of the left foot are obtained. Prior amputation of the fifth digit at the level of the metatarsophalangeal joint. No acute or destructive bony lesions. Mild osteoarthritis of the first metatarsophalangeal joint and throughout the midfoot unchanged. Soft tissues are grossly unremarkable. IMPRESSION: 1. No acute or destructive bony lesions. 2. Stable degenerative changes. 3. Stable prior fifth digit amputation. Electronically Signed   By: Randa Ngo M.D.   On: 10/05/2020 17:28   CT Angio Abd/Pel W and/or Wo Contrast  Result Date: 10/17/2020 CLINICAL DATA:  Status post aortobifemoral bypass grafting on 10/09/2020 now with unspecified abdominal pain, anorexia, and vomiting. EXAM: CTA ABDOMEN AND PELVIS WITHOUT AND WITH CONTRAST TECHNIQUE: Multidetector CT imaging of the abdomen and pelvis was performed using the standard protocol during bolus administration of intravenous contrast. Multiplanar reconstructed images and MIPs were obtained and reviewed to evaluate the vascular anatomy. CONTRAST:  55m OMNIPAQUE IOHEXOL 350 MG/ML SOLN COMPARISON:  CTA 10/05/2020, CT abdomen pelvis 05/10/2020 FINDINGS: VASCULAR Aorta: Aberrant arterial branch arising from the distal thoracic  aorta extending into the right lower lobe is in keeping with an extra lobar pulmonary sequestration, a congenital anomaly. Surgical changes of aortobifemoral bypass grafting are identified with wide patency of the bypass graft. There is reconstituted flow within the native abdominal aorta best appreciated on delayed phase imaging with subsequent filling of the stented native common iliac arteries. Celiac: Patent. No hemodynamically significant stenosis, aneurysm, or dissection. 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 Distally, this vessel is patent without aneurysm or dissection. Renals: Single renal arteries bilaterally are widely patent and demonstrate normal vascular morphology. No aneurysm. 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 Inflow: As noted above, bilateral femoral limbs of the aortobifemoral bypass graft are patent. The heavily diseased native external iliac arteries are patent and are opacified on delayed phase images. Internal iliac arteries are heavily diseased, but are patent bilaterally. Proximal Outflow: Left femoral-distal bypass graft is occluded at its origin, unchanged from prior examination. Left profundus femoral artery is patent. Right superficial femoral artery and profundus femoral artery are patent. Veins: Left retroperitoneal hematoma surrounding the left limb of the aortobifemoral bypass graft demonstrates moderate mass effect upon the left external iliac vein, best appreciated on image # 62/11. This vessel is still patent, however. The abdominal venous vasculature is otherwise unremarkable. Review of the MIP images confirms the above findings. NON-VASCULAR Lower chest: No acute abnormality Hepatobiliary: Mild hepatic steatosis. Liver otherwise unremarkable. Gallbladder unremarkable Pancreas: Unremarkable Spleen: Unremarkable  Adrenals/Urinary Tract:  Adrenal glands are unremarkable. Kidneys are normal, without renal calculi, focal lesion, or hydronephrosis. Bladder is unremarkable. Stomach/Bowel: Stomach is within normal limits. Appendix appears normal. No evidence of bowel wall thickening, distention, or inflammatory changes. No free intraperitoneal gas or fluid. Lymphatic: No pathologic adenopathy within the abdomen and pelvis. Reproductive: Mild prostatic enlargement is unchanged. Other: A a 3.6 x 5.0 cm hematoma surrounds the left limb of the aortobifemoral bypass graft, best seen on image # 59/11. There is small hematoma noted within the inguinal regions bilaterally surrounding the femoral anastomosis of the bypass grafts measuring up to 14 mm on the right and 20 mm on the left. There is no active extravasation identified within these regions. A a subcutaneous hematoma is noted within the left inguinal region measuring 22 mm. Musculoskeletal: Degenerative changes seen within the lumbar spine. No acute bone abnormality. IMPRESSION: VASCULAR Status post aortobifemoral bypass grafting with wide patency of the aortobifemoral bypass graft. 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 No frank bowel ischemia noted on this examination. Moderate mass effect upon the left common iliac vein secondary to retroperitoneal hematoma with preserved patency of this vessel. NON-VASCULAR Hematoma within the left retroperitoneum surrounding the left limb of the aortobifemoral bypass graft demonstrating moderate mass effect upon the left common iliac vein described above as well as small hematoma surrounding the femoral  anastomosis of the bypass graft and within the subcutaneous soft tissues of the left inguinal region. No active extravasation identified. Mild hepatic steatosis. 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 Electronically Signed   By: Fidela Salisbury M.D.   On: 10/17/2020 20:03   (Echo, Carotid, EGD, Colonoscopy, ERCP)    Subjective:  C/o pain left foot  Discharge Exam: Vitals:   10/29/20 0740 10/29/20 1143  BP: 133/75 127/76  Pulse: 91 95  Resp: 17 17  Temp: 98 F (36.7 C) 98.2 F (36.8 C)  SpO2:  100%   Vitals:   10/29/20 0500 10/29/20 0516 10/29/20 0740 10/29/20 1143  BP:  120/85 133/75 127/76  Pulse:  93 91 95  Resp:  '16 17 17  ' Temp:  98.4 F (36.9 C) 98 F (36.7 C) 98.2 F (36.8 C)  TempSrc:  Oral Oral Oral  SpO2:  100%  100%  Weight: 45.9 kg     Height: '5\' 1"'  (1.549 m)       General: Pt is alert, awake, not in acute distress Cardiovascular: RRR, S1/S2 +, no rubs, no gallops Respiratory: CTA bilaterally, no wheezing, no rhonchi Abdominal: Soft, NT, ND, bowel sounds + Extremities: left foot covered with dressings   The results of significant diagnostics from this hospitalization (including imaging, microbiology, ancillary and laboratory) are listed below for reference.     Microbiology: Recent Results (from the past 240 hour(s))  Resp Panel by RT-PCR (Flu A&B, Covid) Nasopharyngeal Swab     Status: None   Collection Time: 10/28/20  3:34 AM   Specimen: Nasopharyngeal Swab; Nasopharyngeal(NP) swabs in vial transport medium  Result Value Ref Range Status   SARS Coronavirus 2 by RT PCR NEGATIVE NEGATIVE Final    Comment: (NOTE) SARS-CoV-2 target nucleic acids are NOT DETECTED.  The SARS-CoV-2 RNA  is generally detectable in upper respiratory specimens during the acute phase of infection. The lowest concentration of SARS-CoV-2 viral copies this assay can detect is 138 copies/mL. A negative result does not preclude  SARS-Cov-2 infection and should not be used as the sole basis for treatment or other patient management decisions. A negative result may occur with  improper specimen collection/handling, submission of specimen other than nasopharyngeal swab, presence of viral mutation(s) within the areas targeted by this assay, and inadequate number of viral copies(<138 copies/mL). A negative result must be combined with clinical observations, patient history, and epidemiological information. The expected result is Negative.  Fact Sheet for Patients:  EntrepreneurPulse.com.au  Fact Sheet for Healthcare Providers:  IncredibleEmployment.be  This test is no t yet approved or cleared by the [place] FDA and  has been authorized for detection and/or diagnosis of SARS-CoV-2 by FDA under an Emergency Use Authorization (EUA). This EUA will remain  in effect (meaning this test can be used) for the duration of the COVID-19 declaration under Section 564(b)(1) of the Act, 21 U.S.C.section 360bbb-3(b)(1), unless the authorization is terminated  or revoked sooner.       Influenza A by PCR NEGATIVE NEGATIVE Final   Influenza B by PCR NEGATIVE NEGATIVE Final    Comment: (NOTE) The Xpert Xpress SARS-CoV-2/FLU/RSV plus assay is intended as an aid in the diagnosis of influenza from Nasopharyngeal swab specimens and should not be used as a sole basis for treatment. Nasal washings and aspirates are unacceptable for Xpert Xpress SARS-CoV-2/FLU/RSV testing.  Fact Sheet for Patients: EntrepreneurPulse.com.au  Fact Sheet for Healthcare Providers: IncredibleEmployment.be  This test is not yet approved or cleared by the [place] FDA and has been authorized for detection and/or diagnosis of SARS-CoV-2 by FDA under an Emergency Use Authorization (EUA). This EUA will remain in effect (meaning this test can be used) for the duration of  the COVID-19 declaration under Section 564(b)(1) of the Act, 21 U.S.C. section 360bbb-3(b)(1), unless the authorization is terminated or revoked.  Performed at [facility], [place] [address] [place], [place] [address]   Blood culture (routine x 2)     Status: None (Preliminary result)   Collection Time: 10/28/20  6:18 AM   Specimen: BLOOD  Result Value Ref Range Status   Specimen Description BLOOD RIGHT ANTECUBITAL  Final   Special Requests   Final    BOTTLES DRAWN AEROBIC AND ANAEROBIC Blood Culture adequate volume   Culture   Final    NO GROWTH 1 DAY Performed at [facility], [place] [address], [place], [place] [address]    Report Status PENDING  Incomplete  Blood culture (routine x 2)     Status: None (Preliminary result)   Collection Time: 10/28/20  6:19 AM   Specimen: BLOOD RIGHT HAND  Result Value Ref Range Status   Specimen Description BLOOD RIGHT HAND  Final   Special Requests   Final    AEROBIC BOTTLE ONLY Blood Culture results may not be optimal due to an excessive volume of blood received in culture bottles   Culture   Final    NO GROWTH 1 DAY Performed at [facility], [place] [address], [place], [place] [address]    Report Status PENDING  Incomplete     Labs: BNP (last 3 results) No results for input(s): BNP in the last 8760 hours. Basic Metabolic Panel: Recent Labs  Lab 10/28/20 0345 10/29/20 0206  NA 141 137  K 3.8 3.8  CL 101 102  CO2 29 29  GLUCOSE 118* 105*  BUN 10 12  CREATININE 0.73 0.89  CALCIUM 9.5 9.0   Liver Function Tests: Recent Labs  Lab 10/28/20 0345  AST 25  ALT 18  ALKPHOS 71  BILITOT 0.6  PROT 7.0  ALBUMIN 3.6   No results for input(s): LIPASE, AMYLASE in the last 168 hours. No results for input(s): AMMONIA in the last 168 hours. CBC: Recent Labs  Lab 10/28/20 0345 10/29/20 0206  WBC 5.8 6.2  NEUTROABS 3.4  --   HGB 9.7* 8.3*  HCT 30.6* 26.7*  MCV 90.3 91.8  PLT 501* 386   Cardiac Enzymes: No  results for input(s): CKTOTAL, CKMB, CKMBINDEX, TROPONINI in the last 168 hours. BNP: Invalid input(s): POCBNP CBG: No results for input(s): GLUCAP in the last 168 hours. D-Dimer No results for input(s): DDIMER in the last 72 hours. Hgb A1c No results for input(s): HGBA1C in the last 72 hours. Lipid Profile No results for input(s): CHOL, HDL, LDLCALC, TRIG, CHOLHDL, LDLDIRECT in the last 72 hours. Thyroid function studies No results for input(s): TSH, T4TOTAL, T3FREE, THYROIDAB in the last 72 hours.  Invalid input(s): FREET3 Anemia work up No results for input(s): VITAMINB12, FOLATE, FERRITIN, TIBC, IRON, RETICCTPCT in the last 72 hours. Urinalysis    Component Value Date/Time   COLORURINE STRAW (A) 05/10/2020 1722   APPEARANCEUR CLEAR 05/10/2020 1722   LABSPEC 1.008 05/10/2020 1722   PHURINE 8.0 05/10/2020 1722   GLUCOSEU NEGATIVE 05/10/2020 1722   HGBUR NEGATIVE 05/10/2020 1722   BILIRUBINUR NEGATIVE 05/10/2020 1722   KETONESUR 5 (A) 05/10/2020 1722   PROTEINUR NEGATIVE 05/10/2020 1722   NITRITE NEGATIVE 05/10/2020 1722   LEUKOCYTESUR NEGATIVE 05/10/2020 1722   Sepsis Labs Invalid input(s): PROCALCITONIN,  WBC,  LACTICIDVEN Microbiology Recent Results (from the past 240 hour(s))  Resp Panel by RT-PCR (Flu A&B, Covid) Nasopharyngeal Swab     Status: None   Collection Time: 10/28/20  3:34 AM   Specimen: Nasopharyngeal Swab; Nasopharyngeal(NP) swabs in vial transport medium  Result Value Ref Range Status   SARS Coronavirus 2 by RT PCR NEGATIVE NEGATIVE Final    Comment: (NOTE) SARS-CoV-2 target nucleic acids are NOT DETECTED.  The SARS-CoV-2 RNA is generally detectable in upper respiratory specimens during the acute phase of infection. The lowest concentration of SARS-CoV-2 viral copies this assay can detect is 138 copies/mL. A negative result does not preclude SARS-Cov-2 infection and should not be used as the sole basis for treatment or other patient management  decisions. A negative result may occur with  improper specimen collection/handling, submission of specimen other than nasopharyngeal swab, presence of viral mutation(s) within the areas targeted by this assay, and inadequate number of viral copies(<138 copies/mL). A negative result must be combined with clinical observations, patient history, and epidemiological information. The expected result is Negative.  Fact Sheet for Patients:  EntrepreneurPulse.com.au  Fact Sheet for Healthcare Providers:  IncredibleEmployment.be  This test is no t yet approved or cleared by the [place] FDA and  has been authorized for detection and/or diagnosis of SARS-CoV-2 by FDA under an Emergency Use Authorization (EUA). This EUA will remain  in effect (meaning this test can be used) for the duration of the COVID-19 declaration under Section 564(b)(1) of the Act, 21 U.S.C.section 360bbb-3(b)(1), unless the authorization is terminated  or revoked sooner.       Influenza A by PCR NEGATIVE NEGATIVE Final   Influenza B by PCR NEGATIVE NEGATIVE Final  Comment: (NOTE) The Xpert Xpress SARS-CoV-2/FLU/RSV plus assay is intended as an aid in the diagnosis of influenza from Nasopharyngeal swab specimens and should not be used as a sole basis for treatment. Nasal washings and aspirates are unacceptable for Xpert Xpress SARS-CoV-2/FLU/RSV testing.  Fact Sheet for Patients: EntrepreneurPulse.com.au  Fact Sheet for Healthcare Providers: IncredibleEmployment.be  This test is not yet approved or cleared by the [place] FDA and has been authorized for detection and/or diagnosis of SARS-CoV-2 by FDA under an Emergency Use Authorization (EUA). This EUA will remain in effect (meaning this test can be used) for the duration of the COVID-19 declaration under Section 564(b)(1) of the Act, 21 U.S.C. section 360bbb-3(b)(1), unless the  authorization is terminated or revoked.  Performed at [facility], [place] [address] [place], [place] [address]   Blood culture (routine x 2)     Status: None (Preliminary result)   Collection Time: 10/28/20  6:18 AM   Specimen: BLOOD  Result Value Ref Range Status   Specimen Description BLOOD RIGHT ANTECUBITAL  Final   Special Requests   Final    BOTTLES DRAWN AEROBIC AND ANAEROBIC Blood Culture adequate volume   Culture   Final    NO GROWTH 1 DAY Performed at [facility], [place] [address], [place], [place] [address]    Report Status PENDING  Incomplete  Blood culture (routine x 2)     Status: None (Preliminary result)   Collection Time: 10/28/20  6:19 AM   Specimen: BLOOD RIGHT HAND  Result Value Ref Range Status   Specimen Description BLOOD RIGHT HAND  Final   Special Requests   Final    AEROBIC BOTTLE ONLY Blood Culture results may not be optimal due to an excessive volume of blood received in culture bottles   Culture   Final    NO GROWTH 1 DAY Performed at [facility], [place] [address], [place], [place] [address]    Report Status PENDING  Incomplete     Time coordinating discharge: 39 minutes  SIGNED:   Georgette Shell, MD  [facility] 10/29/2020, 12:21 PM

## 2020-11-02 LAB — CULTURE, BLOOD (ROUTINE X 2)
Culture: NO GROWTH
Culture: NO GROWTH
Special Requests: ADEQUATE

## 2020-11-04 ENCOUNTER — Ambulatory Visit (HOSPITAL_COMMUNITY)
Admission: RE | Admit: 2020-11-04 | Discharge: 2020-11-04 | Disposition: A | Discharge status: Home / Self Care | Payer: Medicare Other | Source: Ambulatory Visit | Attending: Vascular Surgery | Admitting: Vascular Surgery

## 2020-11-04 ENCOUNTER — Other Ambulatory Visit: Payer: Self-pay

## 2020-11-04 ENCOUNTER — Ambulatory Visit (INDEPENDENT_AMBULATORY_CARE_PROVIDER_SITE_OTHER): Payer: Medicare Other | Admitting: Physician Assistant

## 2020-11-04 VITALS — BP 128/80 | HR 116 | Temp 97.3°F | Resp 16 | Ht 61.0 in | Wt 101.0 lb

## 2020-11-04 DIAGNOSIS — I70229 Atherosclerosis of native arteries of extremities with rest pain, unspecified extremity: Secondary | ICD-10-CM | POA: Insufficient documentation

## 2020-11-04 DIAGNOSIS — I739 Peripheral vascular disease, unspecified: Secondary | ICD-10-CM

## 2020-11-04 NOTE — Progress Notes (Signed)
POST OPERATIVE OFFICE NOTE    CC:  F/u for surgery  HPI:  This is a 60 y.o. male who is s/p aortobifemoral bypass, right and left femoral endarterectomy on October 09, 2020 by Dr. Stanford Breed.  This was due to to aorto occlusive disease.  He initially presented with previous gangrenous changes to his left fifth toe and had undergone amputation by the podiatry service.  He has also undergone left common iliac artery, left superficial femoral and popliteal artery stent placement by Dr. Trula Slade in May 2022.  His primary complaint today is left lateral foot pain and inability to hold down solid food, particularly meats.  He states he had a nonbloody bowel movement yesterday.  No abdominal pain or nausea.  He is ambulating with a cane.  He has left lower extremity pain when walking relieved with rest. He says he no longer has Plavix at home but is unable to tell me when this was stopped.  He is compliant with aspirin and statin.  No Known Allergies  Current Outpatient Medications  Medication Sig Dispense Refill   acetaminophen (TYLENOL) 325 MG tablet Take 2 tablets (650 mg total) by mouth every 6 (six) hours as needed.     aspirin EC 81 MG EC tablet Take 1 tablet (81 mg total) by mouth daily. Swallow whole. (Patient taking differently: Take 81 mg by mouth daily at 2 PM. Swallow whole.) 90 tablet 3   atorvastatin (LIPITOR) 40 MG tablet Take 40 mg by mouth daily.     Blood Pressure Monitor KIT Check blood pressure daily (Patient taking differently: 1 each by Other route once a week.) 1 kit 0   collagenase (SANTYL) ointment Apply topically daily. 15 g 0   gabapentin (NEURONTIN) 600 MG tablet Take 1 tablet (600 mg) in morning and afternoon. Take 2 tablets (1200 mg) at night (Patient taking differently: Take 600-1,200 mg by mouth See admin instructions. 1200 mg in the morning and at bedtime 600 mg  in the afternoon) 120 tablet 0   lisinopril-hydrochlorothiazide (ZESTORETIC) 20-25 MG tablet Take 1 tablet  by mouth daily.     Multiple Vitamin (MULTIVITAMIN WITH MINERALS) TABS tablet Take 1 tablet by mouth daily.     pantoprazole (PROTONIX) 40 MG tablet Take 1 tablet (40 mg total) by mouth daily. 30 tablet 0   traMADol (ULTRAM) 50 MG tablet Take 1 tablet (50 mg total) by mouth every 6 (six) hours as needed for moderate pain. 20 tablet 0   Current Facility-Administered Medications  Medication Dose Route Frequency Provider Last Rate Last Admin   sodium chloride flush (NS) 0.9 % injection 3 mL  3 mL Intravenous Q12H Lorretta Harp, MD         ROS:  See HPI  Vitals:   11/04/20 1541  BP: 128/80  Pulse: (!) 116  Resp: 16  Temp: (!) 97.3 F (36.3 C)  SpO2: 99%     Physical Exam:  General appearance: Awake, alert in no apparent distress.  Thin, chronically ill-appearing Cardiac: Heart rate mildly elevated, rhythm  regular Respirations: Nonlabored Incisions: Right/Left groin  and midline incisions are all well approximated without bleeding or hematoma. Healing well. Extremities: Both feet are warm with intact sensation and motor function.  Stable, dry ulcer of dorsum of right foot. Left 5th toe amp site appears to be healing.  Pulse/Doppler exam: Brisk left dorsalis pedis and peroneal artery Doppler signals. Brisk right PT and peroneal Doppler signals.  ABIs: +-------+-----------+-----------+------------+------------+  ABI/TBIToday's ABIToday's TBIPrevious ABIPrevious TBI  +-------+-----------+-----------+------------+------------+  Right  0.81       absent     0.69        0.42          +-------+-----------+-----------+------------+------------+  Left   0.37       sbsent     0.59        0.49          +-------+-----------+-----------+------------+------------+   Decreased left ABI and bilateral toe-brachial indeces.     Summary:  Right: Resting right ankle-brachial index indicates mild right lower  extremity arterial disease, probably falsely elevated. 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 Unable to obtain toe-brachial index.     *See table(s) above for measurements and observations.         Assessment/Plan:  This is a 60 y.o. male who is s/p: Aortobifemoral bypass with right and left femoral endarterectomy.   Anorexia>>unclear etiology. He does not have signs of SBO. He has lost only about 4 lbs since February. Doubt acute   History of left common iliac artery and external iliac artery stent placement.  Has also had balloon angioplasty on the right. He has left foot pain and likely some element of claudication. Decrease in left ABI.  I will have him return for bilateral lower extremity arterial duplexes in one month.  He has appointment with his PCP in the morning. I encouraged him to discuss his appetite and inability to keep foods down.  Risa Grill, PA-C [facility] [phone]  Clinic MD: Dr. Stanford Breed

## 2020-11-05 ENCOUNTER — Other Ambulatory Visit: Payer: Self-pay

## 2020-11-05 DIAGNOSIS — I739 Peripheral vascular disease, unspecified: Secondary | ICD-10-CM

## 2020-11-10 ENCOUNTER — Emergency Department (HOSPITAL_COMMUNITY)
Admission: EM | Admit: 2020-11-10 | Discharge: 2020-11-11 | Disposition: A | Discharge status: Home / Self Care | Payer: 59 | Attending: Emergency Medicine | Admitting: Emergency Medicine

## 2020-11-10 ENCOUNTER — Telehealth: Payer: Self-pay

## 2020-11-10 ENCOUNTER — Other Ambulatory Visit: Payer: Self-pay

## 2020-11-10 DIAGNOSIS — R197 Diarrhea, unspecified: Secondary | ICD-10-CM | POA: Insufficient documentation

## 2020-11-10 DIAGNOSIS — F17210 Nicotine dependence, cigarettes, uncomplicated: Secondary | ICD-10-CM | POA: Diagnosis not present

## 2020-11-10 DIAGNOSIS — Z7982 Long term (current) use of aspirin: Secondary | ICD-10-CM | POA: Diagnosis not present

## 2020-11-10 DIAGNOSIS — R109 Unspecified abdominal pain: Secondary | ICD-10-CM | POA: Diagnosis not present

## 2020-11-10 DIAGNOSIS — E119 Type 2 diabetes mellitus without complications: Secondary | ICD-10-CM | POA: Diagnosis not present

## 2020-11-10 DIAGNOSIS — Z79899 Other long term (current) drug therapy: Secondary | ICD-10-CM | POA: Insufficient documentation

## 2020-11-10 DIAGNOSIS — I10 Essential (primary) hypertension: Secondary | ICD-10-CM | POA: Insufficient documentation

## 2020-11-10 DIAGNOSIS — R112 Nausea with vomiting, unspecified: Secondary | ICD-10-CM | POA: Diagnosis not present

## 2020-11-10 DIAGNOSIS — M79672 Pain in left foot: Secondary | ICD-10-CM | POA: Diagnosis not present

## 2020-11-10 LAB — BASIC METABOLIC PANEL
Anion gap: 14 (ref 5–15)
BUN: 48 mg/dL — ABNORMAL HIGH (ref 8–23)
CO2: 24 mmol/L (ref 22–32)
Calcium: 10.1 mg/dL (ref 8.9–10.3)
Chloride: 96 mmol/L — ABNORMAL LOW (ref 98–111)
Creatinine, Ser: 1.29 mg/dL — ABNORMAL HIGH (ref 0.61–1.24)
GFR, Estimated: >60 mL/min (ref >60)
Glucose, Bld: 103 mg/dL — ABNORMAL HIGH (ref 70–99)
Potassium: 4.4 mmol/L (ref 3.5–5.1)
Sodium: 134 mmol/L — ABNORMAL LOW (ref 135–145)

## 2020-11-10 LAB — CBC WITH DIFFERENTIAL/PLATELET
Abs Immature Granulocytes: 0.03 10*3/uL (ref 0.00–0.07)
Basophils Absolute: 0 10*3/uL (ref 0.0–0.1)
Basophils Relative: 0 %
Eosinophils Absolute: 0.1 10*3/uL (ref 0.0–0.5)
Eosinophils Relative: 1 %
HCT: 36.4 % — ABNORMAL LOW (ref 39.0–52.0)
Hemoglobin: 10.9 g/dL — ABNORMAL LOW (ref 13.0–17.0)
Immature Granulocytes: 0 %
Lymphocytes Relative: 16 %
Lymphs Abs: 1.2 10*3/uL (ref 0.7–4.0)
MCH: 27.9 pg (ref 26.0–34.0)
MCHC: 29.9 g/dL — ABNORMAL LOW (ref 30.0–36.0)
MCV: 93.3 fL (ref 80.0–100.0)
Monocytes Absolute: 0.8 10*3/uL (ref 0.1–1.0)
Monocytes Relative: 11 %
Neutro Abs: 5.1 10*3/uL (ref 1.7–7.7)
Neutrophils Relative %: 72 %
Platelets: 476 10*3/uL — ABNORMAL HIGH (ref 150–400)
RBC: 3.9 MIL/uL — ABNORMAL LOW (ref 4.22–5.81)
RDW: 16.7 % — ABNORMAL HIGH (ref 11.5–15.5)
WBC: 7.2 10*3/uL (ref 4.0–10.5)
nRBC: 0 % (ref 0.0–0.2)

## 2020-11-10 NOTE — ED Provider Notes (Signed)
Emergency Medicine Provider Triage Evaluation Note  Viraat Souder Lennie Hummer. , a 60 y.o. male  was evaluated in triage.  Pt complains of left foot pain ongoing for several weeks.  Review of Systems  Positive: Left foot pain Negative: fever  Physical Exam  BP 110/83 (BP Location: Left Arm)   Pulse (!) 118   Temp 98.6 F (37 C) (Oral)   Resp 18   SpO2 100%  Gen:   Awake, no distress   Resp:  Normal effort  MSK:   Moves extremities without difficulty  Other:  DP pulse dopplerable on the left foot  Medical Decision Making  Medically screening exam initiated at 1:41 PM.  Appropriate orders placed.  [employer]. was informed that the remainder of the evaluation will be completed by another provider, this initial triage assessment does not replace that evaluation, and the importance of remaining in the ED until their evaluation is complete.     Rodney Booze, PA-C 11/10/20 1342    Daleen Bo, MD 11/10/20 2024

## 2020-11-10 NOTE — Telephone Encounter (Signed)
Patient calls today to report worsening pain in his abdomen, groin and legs. Says his foot is now sore all the time, " like a throbbing, then sharp then numb." He says he has not been able to hold anything down for 3 to 4 days and when he does it just runs right through him with diarrhea. He says he does not have blood in his stool that he is aware of, or fever - but he is having chills. Says he "has a stitch loose in his groin that I grabbed it, but then let it go." Says he saw his PCP after his [facility] office visit and they "said nothing." I discussed with provider, patient to report to Emergency room. Patient verbalizes understanding.

## 2020-11-10 NOTE — ED Triage Notes (Signed)
Patient arrives with complaints of left foot pain since pinky toe was amputated five months ago. Patient also states the he has had increased nausea/vomiting/chills for one week.

## 2020-11-11 ENCOUNTER — Emergency Department (HOSPITAL_COMMUNITY): Payer: 59

## 2020-11-11 DIAGNOSIS — M79672 Pain in left foot: Secondary | ICD-10-CM | POA: Diagnosis not present

## 2020-11-11 MED ORDER — FENTANYL CITRATE PF 50 MCG/ML IJ SOSY
50.0000 ug | PREFILLED_SYRINGE | Freq: Once | INTRAMUSCULAR | Status: AC
Start: 2020-11-11 — End: 2020-11-11
  Administered 2020-11-11: 50 ug via INTRAVENOUS
  Filled 2020-11-11: qty 1

## 2020-11-11 MED ORDER — IOHEXOL 350 MG/ML SOLN
50.0000 mL | Freq: Once | INTRAVENOUS | Status: AC | PRN
  Administered 2020-11-11: 50 mL via INTRAVENOUS

## 2020-11-11 MED ORDER — ONDANSETRON HCL 4 MG/2ML IJ SOLN
4.0000 mg | Freq: Once | INTRAMUSCULAR | Status: AC
  Administered 2020-11-11: 4 mg via INTRAVENOUS
  Filled 2020-11-11: qty 2

## 2020-11-11 MED ORDER — LACTATED RINGERS IV BOLUS
1000.0000 mL | Freq: Once | INTRAVENOUS | Status: AC
  Administered 2020-11-11: 1000 mL via INTRAVENOUS

## 2020-11-11 MED ORDER — ONDANSETRON 4 MG PO TBDP: ORAL_TABLET | ORAL | 0 refills | Status: DC

## 2020-11-11 NOTE — ED Provider Notes (Signed)
[facility] EMERGENCY DEPARTMENT Provider Note   CSN: [id number] Arrival date & time: 11/10/20  1243     History Chief Complaint  Patient presents with   Nausea   Foot Pain    Left Foot pain     [name]. is a 60 y.o. male.  60 year old male who presents emerged from today for recurrent foot pain.  Patient states that this is been going on for 4 to 5 months comes and goes.  Is been seen multiple times for the same.  Patient states he got more severe yesterday so is why came here for evaluation.  Of note he also states he has groin pain, abdominal pain, nausea, vomiting, diarrhea.  No fevers.  No other associated symptoms.     Foot Pain      Past Medical History:  Diagnosis Date   Diabetes mellitus without complication ([place])    Hyperlipidemia    Hypertension    Peripheral vascular disease ([place])    Tobacco use     Patient Active Problem List   Diagnosis Date Noted   Critical limb ischemia of left lower extremity with bypass graft ([place]) 10/28/2020   Marijuana abuse 10/28/2020   Nicotine dependence, cigarettes, uncomplicated 82/99/3716   Mixed hyperlipidemia 10/05/2020   Protein-calorie malnutrition, severe 06/07/2020   Osteomyelitis of fifth toe of left foot ([place]) 06/07/2020   Tobacco use    Toe infection 06/06/2020   PVD (peripheral vascular disease) ([place]) 12/19/2019   Claudication in peripheral vascular disease ([place]) 08/16/2019   Critical ischemia of foot ([place]) 07/16/2019   Critical lower limb ischemia ([place]) 07/10/2019   Pure hypercholesterolemia 10/13/2018   Type 2 diabetes mellitus without complication, without long-term current use of insulin ([place]) 10/13/2018   Tobacco use disorder 10/13/2018   Essential hypertension 10/23/2009    Past Surgical History:  Procedure Laterality Date   ABDOMINAL AORTOGRAM W/LOWER EXTREMITY Left 07/16/2019   Procedure: ABDOMINAL AORTOGRAM W/LOWER EXTREMITY;  Surgeon: Lorretta Harp, MD;   Location: [place] [facility];  Service: Cardiovascular;  Laterality: Left;   ABDOMINAL AORTOGRAM W/LOWER EXTREMITY Left 08/16/2019   ABDOMINAL AORTOGRAM W/LOWER EXTREMITY N/A 08/16/2019   Procedure: ABDOMINAL AORTOGRAM W/LOWER EXTREMITY;  Surgeon: Lorretta Harp, MD;  Location: [place] [facility];  Service: Cardiovascular;  Laterality: N/A;   ABDOMINAL AORTOGRAM W/LOWER EXTREMITY Bilateral 12/19/2019   Procedure: ABDOMINAL AORTOGRAM W/LOWER EXTREMITY;  Surgeon: Wellington Hampshire, MD;  Location: [place] [facility];  Service: Cardiovascular;  Laterality: Bilateral;   ABDOMINAL AORTOGRAM W/LOWER EXTREMITY Left 06/10/2020   Procedure: ABDOMINAL AORTOGRAM W/LOWER EXTREMITY;  Surgeon: Serafina Mitchell, MD;  Location: [place] [facility];  Service: Cardiovascular;  Laterality: Left;   ABDOMINAL AORTOGRAM W/LOWER EXTREMITY N/A 10/06/2020   Procedure: ABDOMINAL AORTOGRAM W/LOWER EXTREMITY;  Surgeon: Waynetta Sandy, MD;  Location: [place] [facility];  Service: Cardiovascular;  Laterality: N/A;   AMPUTATION TOE Left 06/13/2020   Procedure: Left Fifth Toe Amputation ;  Surgeon: Criselda Peaches, DPM;  Location: [place];  Service: Podiatry;  Laterality: Left;   AORTA - BILATERAL FEMORAL ARTERY BYPASS GRAFT Bilateral 10/09/2020   Procedure: AORTA BIFEMORAL BYPASS GRAFT;  Surgeon: Cherre Robins, MD;  Location: [facility];  Service: Vascular;  Laterality: Bilateral;   arm surgery     ENDARTERECTOMY FEMORAL Bilateral 10/09/2020   Procedure: ENDARTERECTOMY COMMON FEMORAL;  Surgeon: Cherre Robins, MD;  Location: [facility];  Service: Vascular;  Laterality: Bilateral;   PERIPHERAL VASCULAR BALLOON ANGIOPLASTY  08/16/2019   Procedure: PERIPHERAL VASCULAR BALLOON ANGIOPLASTY;  Surgeon: Lorretta Harp, MD;  Location: [place] [facility];  Service: Cardiovascular;;  attempted PTA of Left SFA   PERIPHERAL VASCULAR INTERVENTION Right 07/16/2019   Procedure: PERIPHERAL VASCULAR INTERVENTION;  Surgeon: Lorretta Harp,  MD;  Location: [place] [facility];  Service: Cardiovascular;  Laterality: Right;   PERIPHERAL VASCULAR INTERVENTION Right 12/19/2019   Procedure: PERIPHERAL VASCULAR INTERVENTION;  Surgeon: Wellington Hampshire, MD;  Location: [place] [facility];  Service: Cardiovascular;  Laterality: Right;  iliac   PERIPHERAL VASCULAR INTERVENTION Left 06/10/2020   Procedure: PERIPHERAL VASCULAR INTERVENTION;  Surgeon: Serafina Mitchell, MD;  Location: [place] [facility];  Service: Cardiovascular;  Laterality: Left;  SFA /COMMON ILIAC       Family History  Problem Relation Age of Onset   Cancer Mother    Diabetes Father     Social History   Tobacco Use   Smoking status: Every Day    Packs/day: 0.50    Years: 40.00    Pack years: 20.00    Types: Cigarettes   Smokeless tobacco: Never  Vaping Use   Vaping Use: Never used  Substance Use Topics   Alcohol use: Yes    Comment: beer and liquor daily   Drug use: Yes    Frequency: 3.0 times per week    Types: Marijuana, Cocaine    Home Medications Prior to Admission medications   Medication Sig Start Date End Date Taking? Authorizing Provider  ondansetron (ZOFRAN ODT) 4 MG disintegrating tablet 80m ODT q4 hours prn nausea/vomit 11/11/20  Yes Dazha Kempa, JCorene Cornea MD  acetaminophen (TYLENOL) 325 MG tablet Take 2 tablets (650 mg total) by mouth every 6 (six) hours as needed. 10/14/20   RDebbe Odea MD  aspirin EC 81 MG EC tablet Take 1 tablet (81 mg total) by mouth daily. Swallow whole. Patient taking differently: Take 81 mg by mouth daily at 2 PM. Swallow whole. 07/18/19   Furth, Cadence H, PA-C  atorvastatin (LIPITOR) 40 MG tablet Take 40 mg by mouth daily. 04/06/20   [provider]  Blood Pressure Monitor KIT Check blood pressure daily Patient taking differently: 1 each by Other route once a week. 10/20/18   SJacelyn Pi ILilia Argue MD  collagenase (SANTYL) ointment Apply topically daily. 10/29/20   MGeorgette Shell MD  gabapentin (NEURONTIN) 600  MG tablet Take 1 tablet (600 mg) in morning and afternoon. Take 2 tablets (1200 mg) at night Patient taking differently: Take 600-1,200 mg by mouth See admin instructions. 1200 mg in the morning and at bedtime 600 mg  in the afternoon 06/16/20   PLavina Hamman MD  lisinopril-hydrochlorothiazide (ZESTORETIC) 20-25 MG tablet Take 1 tablet by mouth daily. 04/16/20   [provider]  Multiple Vitamin (MULTIVITAMIN WITH MINERALS) TABS tablet Take 1 tablet by mouth daily. 10/14/20   RDebbe Odea MD  pantoprazole (PROTONIX) 40 MG tablet Take 1 tablet (40 mg total) by mouth daily. 10/14/20   RDebbe Odea MD  traMADol (ULTRAM) 50 MG tablet Take 1 tablet (50 mg total) by mouth every 6 (six) hours as needed for moderate pain. 10/29/20   MGeorgette Shell MD    Allergies    Patient has no known allergies.  Review of Systems   Review of Systems  All other systems reviewed and are negative.  Physical Exam Updated Vital Signs BP 110/67   Pulse 68   Temp 97.8 F (36.6 C)   Resp 18  SpO2 98%   Physical Exam Vitals and nursing note reviewed.  Constitutional:      Appearance: He is well-developed.  HENT:     Head: Normocephalic and atraumatic.     Mouth/Throat:     Mouth: Mucous membranes are dry.     Pharynx: Oropharynx is clear.  Eyes:     Pupils: Pupils are equal, round, and reactive to light.  Cardiovascular:     Rate and Rhythm: Normal rate.  Pulmonary:     Effort: Pulmonary effort is normal. No respiratory distress.  Abdominal:     General: There is no distension.  Musculoskeletal:        General: Normal range of motion.     Cervical back: Normal range of motion.  Skin:    General: Skin is warm and dry.     Comments: Dry/ashy  Neurological:     General: No focal deficit present.     Mental Status: He is alert.    ED Results / Procedures / Treatments   Labs (all labs ordered are listed, but only abnormal results are displayed) Labs Reviewed  CBC WITH  DIFFERENTIAL/PLATELET - Abnormal; Notable for the following components:      Result Value   RBC 3.90 (*)    Hemoglobin 10.9 (*)    HCT 36.4 (*)    MCHC 29.9 (*)    RDW 16.7 (*)    Platelets 476 (*)    All other components within normal limits  BASIC METABOLIC PANEL - Abnormal; Notable for the following components:   Sodium 134 (*)    Chloride 96 (*)    Glucose, Bld 103 (*)    BUN 48 (*)    Creatinine, Ser 1.29 (*)    All other components within normal limits    EKG None  Radiology CT ABDOMEN PELVIS W CONTRAST  Result Date: 11/11/2020 CLINICAL DATA:  Nausea, vomiting, chills EXAM: CT ABDOMEN AND PELVIS WITH CONTRAST TECHNIQUE: Multidetector CT imaging of the abdomen and pelvis was performed using the standard protocol following bolus administration of intravenous contrast. CONTRAST:  46m OMNIPAQUE IOHEXOL 350 MG/ML SOLN COMPARISON:  10/17/2020 and previous FINDINGS: Lower chest: No pleural or pericardial effusion. Visualized lung bases clear. Aberrant arterial branch to a portion of the right lower lung consistent with sequestration, as noted previously. Hepatobiliary: No focal liver abnormality is seen. No gallstones, gallbladder wall thickening, or biliary dilatation. Pancreas: Unremarkable. No pancreatic ductal dilatation or surrounding inflammatory changes. Spleen: Normal in size without focal abnormality. Adrenals/Urinary Tract: Adrenal glands are unremarkable. Kidneys are normal, without renal calculi, focal lesion, or hydronephrosis. Bladder is unremarkable. Stomach/Bowel: The stomach is nondistended, unremarkable. Small bowel decompressed. Appendix not identified. The colon is nondilated, unremarkable. Vascular/Lymphatic: Moderate partially calcified atheromatous plaque in the aorta without aneurysm. Infrarenal aortobifem graft is patent. Thrombosed stents in the excluded bilateral iliac arterial systems. There is occlusion of the proximal left SFA despite presence of stent. Origin  stenosis of the right SFA which remains patent. Reproductive: Prostate is unremarkable. Other: No ascites.  No free air. Musculoskeletal: Degenerative disc disease L4-S1. No fracture or worrisome bone lesion. IMPRESSION: 1. No acute findings. 2. 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 3. Right lung extrapulmonary sequestration, a congenital anomaly. Electronically Signed   By: DLucrezia EuropeM.D.   On: 11/11/2020 06:35   DG Foot Complete Left  Result Date: 11/11/2020 CLINICAL DATA:  Pain post amputation 5 months ago EXAM: LEFT FOOT - COMPLETE 3+ VIEW COMPARISON:  10/28/2020 FINDINGS: Amputation across the fifth MTP joint. Otherwise normal mineralization and alignment. Negative for fracture or dislocation. No cortical loss to suggest osteomyelitis. No radiodense foreign body or subcutaneous gas. Scattered arterial calcifications noted. IMPRESSION: 1. Stable appearance of fifth MTP amputation. No radiographic suggestion of osteomyelitis. Electronically Signed   By: Lucrezia Europe M.D.   On: 11/11/2020 05:33    Procedures Procedures   Medications Ordered in ED Medications  ondansetron (ZOFRAN) injection 4 mg (4 mg Intravenous Given 11/11/20 0508)  fentaNYL (SUBLIMAZE) injection 50 mcg (50 mcg Intravenous Given 11/11/20 0508)  lactated ringers bolus 1,000 mL (0 mLs Intravenous Stopped 11/11/20 0703)  iohexol (OMNIPAQUE) 350 MG/ML injection 50 mL (50 mLs Intravenous Contrast Given 11/11/20 0541)    ED Course  I have reviewed the triage vital signs and the nursing notes.  Pertinent labs & imaging results that were available during my care of the patient were reviewed by me and considered in my medical decision making (see chart for details).    MDM Rules/Calculators/A&P                         Will treat symptoms. Eval for abdominal/foot infection.  Foot ok. Nausea improved. No indication for further workup or admission at this time.   Final Clinical Impression(s) /  ED Diagnoses Final diagnoses:  Foot pain, left    Rx / DC Orders ED Discharge Orders          Ordered    ondansetron (ZOFRAN ODT) 4 MG disintegrating tablet        11/11/20 0641             Ashlin Kreps, Corene Cornea, MD 11/12/20 [phone]

## 2020-11-14 ENCOUNTER — Telehealth: Payer: Self-pay

## 2020-11-14 NOTE — Telephone Encounter (Signed)
Heather from [facility] called to report that patient has been non-compliant with treatment plans and rescheduling and refusing visits. He is still doing recreational drugs. HH has decided to discharge him from their services at this time.

## 2020-11-14 NOTE — Telephone Encounter (Signed)
[name] from [facility] calls today to report he is discharging patient per patient request.

## 2020-11-18 NOTE — Telephone Encounter (Signed)
Heather from [facility] calls again today to report that patient is still very non compliant with treatments and will be discharged from their services next week.

## 2020-12-02 ENCOUNTER — Ambulatory Visit: Payer: Medicare Other

## 2020-12-02 ENCOUNTER — Encounter (HOSPITAL_COMMUNITY): Payer: Medicare Other

## 2020-12-17 ENCOUNTER — Other Ambulatory Visit: Payer: Self-pay

## 2020-12-17 ENCOUNTER — Encounter (HOSPITAL_BASED_OUTPATIENT_CLINIC_OR_DEPARTMENT_OTHER): Payer: 59 | Attending: Physician Assistant | Admitting: Physician Assistant

## 2020-12-17 DIAGNOSIS — E11621 Type 2 diabetes mellitus with foot ulcer: Secondary | ICD-10-CM | POA: Diagnosis present

## 2020-12-17 DIAGNOSIS — L97528 Non-pressure chronic ulcer of other part of left foot with other specified severity: Secondary | ICD-10-CM | POA: Insufficient documentation

## 2020-12-17 DIAGNOSIS — E1140 Type 2 diabetes mellitus with diabetic neuropathy, unspecified: Secondary | ICD-10-CM | POA: Insufficient documentation

## 2020-12-23 NOTE — Progress Notes (Signed)
Ruben, Huang ([id number]) Visit Report for 12/17/2020 Abuse/Suicide Risk Screen Details Patient Name: Date of Service: [name] 12/17/2020 9:00 A M Medical Record Number: [id number] Patient Account Number: [id number] Date of Birth/Sex: Treating RN: [birth year] (60 y.o. [name] Primary Care Bird Swetz: Cipriano Mile Other Clinician: Referring Talia Hoheisel: Treating Nicolemarie Wooley/Extender: Jolyne Loa Weeks in Treatment: 0 Abuse/Suicide Risk Screen Items Answer ABUSE RISK SCREEN: Has anyone close to you tried to hurt or harm you recentlyo No Do you feel uncomfortable with anyone in your familyo No Has anyone forced you do things that you didnt want to doo No Electronic Signature(s) Signed: 12/17/2020 5:41:23 PM By: Dellie Catholic RN Signed: 12/23/2020 5:53:22 PM By: Baruch Gouty RN, BSN Entered By: Dellie Catholic on 12/17/2020 09:10:02 -------------------------------------------------------------------------------- Activities of Daily Living Details Patient Name: Date of Service: Ruben Leavell J. 12/17/2020 9:00 A M Medical Record Number: [id number] Patient Account Number: [id number] Date of Birth/Sex: Treating RN: [birth year] (60 y.o. [name] Primary Care Jahaan Vanwagner: Cipriano Mile Other Clinician: Referring Abbagayle Zaragoza: Treating Tecora Eustache/Extender: Jolyne Loa Weeks in Treatment: 0 Activities of Daily Living Items Answer Activities of Daily Living (Please select one for each item) Drive Automobile Not Able T Medications ake Completely Able Use T elephone Completely Able Care for Appearance Completely Able Use T oilet Completely Able Bath / Shower Need Assistance Dress Self Completely Able Feed Self Completely Able Walk Completely Able Get In / Out Bed Completely Able Housework Need Assistance Prepare Meals Need Assistance Handle Money Completely Able Shop for Self Completely Able Electronic  Signature(s) Signed: 12/17/2020 5:41:23 PM By: Dellie Catholic RN Signed: 12/23/2020 5:53:22 PM By: Baruch Gouty RN, BSN Entered By: Dellie Catholic on 12/17/2020 09:11:33 -------------------------------------------------------------------------------- Education Screening Details Patient Name: Date of Service: Ruben Leavell J. 12/17/2020 9:00 A M Medical Record Number: [id number] Patient Account Number: [id number] Date of Birth/Sex: Treating RN: 12-Aug-1960 (59 y.o. [name] Primary Care Irmgard Rampersaud: Cipriano Mile Other Clinician: Referring Marirose Deveney: Treating Jerami Tammen/Extender: Jolyne Loa Weeks in Treatment: 0 Learning Preferences/Education Level/Primary Language Learning Preference: Explanation, Demonstration, Communication Board, Printed Material Highest Education Level: High School Preferred Language: English Cognitive Barrier Language Barrier: No Translator Needed: No Memory Deficit: No Emotional Barrier: No Cultural/Religious Beliefs Affecting Medical Care: No Physical Barrier Impaired Vision: No Impaired Hearing: No Decreased Hand dexterity: No Knowledge/Comprehension Knowledge Level: High Comprehension Level: High Ability to understand written instructions: High Ability to understand verbal instructions: High Motivation Anxiety Level: Calm Cooperation: Cooperative Education Importance: Acknowledges Need Interest in Health Problems: Asks Questions Perception: Coherent Willingness to Engage in Self-Management High Activities: Readiness to Engage in Self-Management High Activities: Electronic Signature(s) Signed: 12/17/2020 5:41:23 PM By: Dellie Catholic RN Signed: 12/23/2020 5:53:22 PM By: Baruch Gouty RN, BSN Entered By: Dellie Catholic on 12/17/2020 09:13:59 -------------------------------------------------------------------------------- Fall Risk Assessment Details Patient Name: Date of Service: Ruben Hahn RD, [name]  12/17/2020 9:00 A M Medical Record Number: [id number] Patient Account Number: [id number] Date of Birth/Sex: Treating RN: [birth year] (60 y.o. [name] Primary Care Danniel Grenz: Cipriano Mile Other Clinician: Referring Temima Kutsch: Treating Marie Chow/Extender: Jolyne Loa Weeks in Treatment: 0 Fall Risk Assessment Items Have you had 2 or more falls in the last 12 monthso 0 Yes Have you had any fall that resulted in injury in the last 12 monthso 0 Yes FALLS RISK SCREEN History of falling - immediate or within 3 months 0 No Secondary diagnosis (Do you have 2 or  more medical diagnoseso) 0 No Ambulatory aid None/bed rest/wheelchair/nurse 0 No Crutches/cane/walker 15 Yes Furniture 30 Yes Intravenous therapy Access/Saline/Heparin Lock 0 No Gait/Transferring Normal/ bed rest/ wheelchair 0 Yes Weak (short steps with or without shuffle, stooped but able to lift head while walking, may seek 10 Yes support from furniture) Impaired (short steps with shuffle, may have difficulty arising from chair, head down, impaired 0 No balance) Mental Status Oriented to own ability 0 Yes Electronic Signature(s) Signed: 12/17/2020 5:41:23 PM By: Dellie Catholic RN Signed: 12/23/2020 5:53:22 PM By: Baruch Gouty RN, BSN Entered By: Dellie Catholic on 12/17/2020 09:15:23 -------------------------------------------------------------------------------- Foot Assessment Details Patient Name: Date of Service: Ruben Reeks, [name] 12/17/2020 9:00 A M Medical Record Number: [id number] Patient Account Number: [id number] Date of Birth/Sex: Treating RN: [birth year] (61 y.o. [name] Primary Care Joane Postel: Cipriano Mile Other Clinician: Referring Eleny Cortez: Treating Oracio Galen/Extender: Jolyne Loa Weeks in Treatment: 0 Foot Assessment Items Site Locations + = Sensation present, - = Sensation absent, C = Callus, U = Ulcer R = Redness, W = Warmth, M = Maceration,  PU = Pre-ulcerative lesion F = Fissure, S = Swelling, D = Dryness Assessment Right: Left: Other Deformity: No No Prior Foot Ulcer: No No Prior Amputation: No Yes Charcot Joint: No No Ambulatory Status: Ambulatory Without Help Gait: Unsteady Notes Amputation of left 5th toe Electronic Signature(s) Signed: 12/17/2020 5:41:23 PM By: Dellie Catholic RN Signed: 12/23/2020 5:53:22 PM By: Baruch Gouty RN, BSN Entered By: Dellie Catholic on 12/17/2020 09:23:58 -------------------------------------------------------------------------------- Nutrition Risk Screening Details Patient Name: Date of Service: Ruben Leavell J. 12/17/2020 9:00 A M Medical Record Number: [id number] Patient Account Number: [id number] Date of Birth/Sex: Treating RN: May 14, 1960 (60 y.o. [name] Primary Care Elic Vencill: Cipriano Mile Other Clinician: Referring Twain Stenseth: Treating Alliah Boulanger/Extender: Jolyne Loa Weeks in Treatment: 0 Height (in): 62 Weight (lbs): 120 Body Mass Index (BMI): 21.9 Nutrition Risk Screening Items Score Screening NUTRITION RISK SCREEN: I have an illness or condition that made me change the kind and/or amount of food I eat 0 No I eat fewer than two meals per day 3 Yes I eat few fruits and vegetables, or milk products 2 Yes I have three or more drinks of beer, liquor or wine almost every day 0 No I have tooth or mouth problems that make it hard for me to eat 0 No I don't always have enough money to buy the food I need 4 Yes I eat alone most of the time 1 Yes I take three or more different prescribed or over-the-counter drugs a day 0 No Without wanting to, I have lost or gained 10 pounds in the last six months 2 Yes I am not always physically able to shop, cook and/or feed myself 0 No Nutrition Protocols Good Risk Protocol 0 No interventions needed Moderate Risk Protocol High Risk Proctocol Risk Level: High Risk Score: 12 Electronic  Signature(s) Signed: 12/17/2020 5:41:23 PM By: Dellie Catholic RN Signed: 12/23/2020 5:53:22 PM By: Baruch Gouty RN, BSN Entered By: Dellie Catholic on 12/17/2020 09:18:33

## 2020-12-23 NOTE — Progress Notes (Signed)
[name] ([id number]) Visit Report for 12/17/2020 Allergy List Details Patient Name: Date of Service: [name] 12/17/2020 9:00 A M Medical Record Number: [id number] Patient Account Number: [id number] Date of Birth/Sex: Treating RN: Dec 14, 1960 (60 y.o. [name] Primary Care Ginger Leeth: Cipriano Mile Other Clinician: Referring Maxton Noreen: Treating Laritza Vokes/Extender: Oneida Arenas, Rachel Moulds Weeks in Treatment: 0 Allergies Active Allergies No Known Drug Allergies Allergy Notes Electronic Signature(s) Signed: 12/17/2020 5:41:23 PM By: Dellie Catholic RN Entered By: Dellie Catholic on 12/17/2020 09:07:02 -------------------------------------------------------------------------------- Arrival Information Details Patient Name: Date of Service: Ruben Reeks, [name] 12/17/2020 9:00 A M Medical Record Number: [id number] Patient Account Number: [id number] Date of Birth/Sex: Treating RN: [birth year] (60 y.o. [name] Primary Care Vladimir Lenhoff: Cipriano Mile Other Clinician: Referring Johannes Everage: Treating Taniah Reinecke/Extender: Jolyne Loa Weeks in Treatment: 0 Visit Information Patient Arrived: Cane Arrival Time: 09:00 Accompanied By: self Transfer Assistance: None Patient Identification Verified: Yes Secondary Verification Process Completed: Yes Patient Requires Transmission-Based Precautions: No Patient Has Alerts: No History Since Last Visit Added or deleted any medications: No Any new allergies or adverse reactions: No Had a fall or experienced change in activities of daily living that may affect risk of falls: No Signs or symptoms of abuse/neglect since last visito No Hospitalized since last visit: No Implantable device outside of the clinic excluding cellular tissue based products placed in the center since last visit: No Electronic Signature(s) Signed: 12/17/2020 5:41:23 PM By: Dellie Catholic RN Entered By: Dellie Catholic  on 12/17/2020 09:04:22 -------------------------------------------------------------------------------- Clinic Level of Care Assessment Details Patient Name: Date of Service: [name] 12/17/2020 9:00 [place] Record Number: [id number] Patient Account Number: [id number] Date of Birth/Sex: Treating RN: 03/16/1960 (60 y.o. [name] Primary Care Auren Valdes: Cipriano Mile Other Clinician: Referring Eilene Voigt: Treating Jareb Radoncic/Extender: Jolyne Loa Weeks in Treatment: 0 Clinic Level of Care Assessment Items TOOL 2 Quantity Score []  - 0 Use when only an EandM is performed on the INITIAL visit ASSESSMENTS - Nursing Assessment / Reassessment X- 1 20 General Physical Exam (combine w/ comprehensive assessment (listed just below) when performed on new pt. evals) X- 1 25 Comprehensive Assessment (HX, ROS, Risk Assessments, Wounds Hx, etc.) ASSESSMENTS - Wound and Skin A ssessment / Reassessment X - Simple Wound Assessment / Reassessment - one wound 1 5 []  - 0 Complex Wound Assessment / Reassessment - multiple wounds []  - 0 Dermatologic / Skin Assessment (not related to wound area) ASSESSMENTS - Ostomy and/or Continence Assessment and Care []  - 0 Incontinence Assessment and Management []  - 0 Ostomy Care Assessment and Management (repouching, etc.) PROCESS - Coordination of Care X - Simple Patient / Family Education for ongoing care 1 15 []  - 0 Complex (extensive) Patient / Family Education for ongoing care X- 1 10 Staff obtains Programmer, systems, Records, T Results / Process Orders est []  - 0 Staff telephones HHA, Nursing Homes / Clarify orders / etc []  - 0 Routine Transfer to another Facility (non-emergent condition) []  - 0 Routine Hospital Admission (non-emergent condition) X- 1 15 New Admissions / Biomedical engineer / Ordering NPWT Apligraf, etc. , []  - 0 Emergency Hospital Admission (emergent condition) X- 1 10 Simple Discharge  Coordination []  - 0 Complex (extensive) Discharge Coordination PROCESS - Special Needs []  - 0 Pediatric / Minor Patient Management []  - 0 Isolation Patient Management []  - 0 Hearing / Language / Visual special needs []  - 0 Assessment of Community assistance (transportation,  D/C planning, etc.) []  - 0 Additional assistance / Altered mentation []  - 0 Support Surface(s) Assessment (bed, cushion, seat, etc.) INTERVENTIONS - Wound Cleansing / Measurement X- 1 5 Wound Imaging (photographs - any number of wounds) []  - 0 Wound Tracing (instead of photographs) X- 1 5 Simple Wound Measurement - one wound []  - 0 Complex Wound Measurement - multiple wounds X- 1 5 Simple Wound Cleansing - one wound []  - 0 Complex Wound Cleansing - multiple wounds INTERVENTIONS - Wound Dressings X - Small Wound Dressing one or multiple wounds 1 10 []  - 0 Medium Wound Dressing one or multiple wounds []  - 0 Large Wound Dressing one or multiple wounds []  - 0 Application of Medications - injection INTERVENTIONS - Miscellaneous []  - 0 External ear exam []  - 0 Specimen Collection (cultures, biopsies, blood, body fluids, etc.) []  - 0 Specimen(s) / Culture(s) sent or taken to Lab for analysis []  - 0 Patient Transfer (multiple staff / Civil Service fast streamer / Similar devices) []  - 0 Simple Staple / Suture removal (25 or less) []  - 0 Complex Staple / Suture removal (26 or more) []  - 0 Hypo / Hyperglycemic Management (close monitor of Blood Glucose) []  - 0 Ankle / Brachial Index (ABI) - do not check if billed separately Has the patient been seen at the hospital within the last three years: Yes Total Score: 125 Level Of Care: New/Established - Level 4 Electronic Signature(s) Signed: 12/23/2020 5:53:22 PM By: Baruch Gouty RN, BSN Entered By: Baruch Gouty on 12/23/2020 17:20:53 -------------------------------------------------------------------------------- Lower Extremity Assessment Details Patient Name:  Date of Service: Ruben Reeks, [name] 12/17/2020 9:00 A M Medical Record Number: [id number] Patient Account Number: [id number] Date of Birth/Sex: Treating RN: [birth year] (60 y.o. [name] Primary Care Winfield Caba: Cipriano Mile Other Clinician: Referring Larnie Heart: Treating Lambert Jeanty/Extender: Jolyne Loa Weeks in Treatment: 0 Edema Assessment Assessed: [Left: No] [Right: No] [Left: Edema] [Right: :] Calf Left: Right: Point of Measurement: 28.2 cm From Medial Instep 25.3 cm Ankle Left: Right: Point of Measurement: 9 cm From Medial Instep 19.2 cm Vascular Assessment Pulses: Dorsalis Pedis Palpable: [Left:Yes] Electronic Signature(s) Signed: 12/17/2020 5:41:23 PM By: Dellie Catholic RN Signed: 12/23/2020 5:53:22 PM By: Baruch Gouty RN, BSN Entered By: Dellie Catholic on 12/17/2020 09:29:27 -------------------------------------------------------------------------------- Multi-Disciplinary Care Plan Details Patient Name: Date of Service: [name], [name] 12/17/2020 9:00 A M Medical Record Number: [id number] Patient Account Number: [id number] Date of Birth/Sex: Treating RN: Sep 29, 1960 (60 y.o. Burnadette Pop, Lauren Primary Care Zabella Wease: Cipriano Mile Other Clinician: Referring Jaime Grizzell: Treating Aliyanna Wassmer/Extender: Jolyne Loa Weeks in Treatment: 0 Active Inactive Orientation to the Wound Care Program Nursing Diagnoses: Knowledge deficit related to the wound healing center program Goals: Patient/caregiver will verbalize understanding of the [facility] Program Date Initiated: 12/17/2020 Target Resolution Date: 12/31/2020 Goal Status: Active Interventions: Provide education on orientation to the wound center Notes: Wound/Skin Impairment Nursing Diagnoses: Impaired tissue integrity Knowledge deficit related to smoking impact on wound healing Knowledge deficit related to ulceration/compromised skin  integrity Goals: Patient will demonstrate a reduced rate of smoking or cessation of smoking Date Initiated: 12/17/2020 Target Resolution Date: 12/19/2020 Goal Status: Active Patient will have a decrease in wound volume by X% from date: (specify in notes) Date Initiated: 12/17/2020 Target Resolution Date: 12/19/2020 Goal Status: Active Patient/caregiver will verbalize understanding of skin care regimen Date Initiated: 12/17/2020 Target Resolution Date: 12/19/2020 Goal Status: Active Ulcer/skin breakdown will have a volume reduction of 30% by week  4 Date Initiated: 12/17/2020 Target Resolution Date: 12/19/2020 Goal Status: Active Interventions: Assess patient/caregiver ability to obtain necessary supplies Assess patient/caregiver ability to perform ulcer/skin care regimen upon admission and as needed Assess ulceration(s) every visit Provide education on smoking Provide education on ulcer and skin care Notes: Electronic Signature(s) Signed: 12/17/2020 3:00:15 PM By: Rhae Hammock RN Entered By: Rhae Hammock on 12/17/2020 10:07:06 -------------------------------------------------------------------------------- Patient/Caregiver Education Details Patient Name: Date of Service: [name] 11/23/2022andnbsp9:00 A M Medical Record Number: [id number] Patient Account Number: [id number] Date of Birth/Gender: Treating RN: 07/03/1960 (60 y.o. Erie Noe Primary Care Physician: Cipriano Mile Other Clinician: Referring Physician: Treating Physician/Extender: Jolyne Loa Weeks in Treatment: 0 Education Assessment Education Provided To: Patient Education Topics Provided Smoking and Wound Healing: Methods: Explain/Verbal Responses: Reinforcements needed, State content correctly Electronic Signature(s) Signed: 12/17/2020 3:00:15 PM By: Rhae Hammock RN Entered By: Rhae Hammock on 12/17/2020  10:07:18 -------------------------------------------------------------------------------- Wound Assessment Details Patient Name: Date of Service: [name] 12/17/2020 9:00 [place] Record Number: [id number] Patient Account Number: [id number] Date of Birth/Sex: Treating RN: 12/05/1960 (60 y.o. 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 Date Acquired: 09/25/2020 foot Weeks Of Treatment: 0 Comorbid Hypertension, Peripheral Arterial Disease, Type II Diabetes, Clustered Wound: No History: Neuropathy Wound under treatment by Lamaya Hyneman outside of [facility] Wound Measurements Length: (cm) 0.7 Width: (cm) 0.7 Depth: (cm) 0.1 Area: (cm) 0.385 Volume: (cm) 0.038 % Reduction in Area: 0% % Reduction in Volume: 0% Epithelialization: None Wound Description Classification: Unable to visualize wound bed Wound Margin: Flat and Intact Exudate Amount: Medium Exudate Type: Serosanguineous Exudate Color: red, brown Foul Odor After Cleansing: No Slough/Fibrino Yes Wound Bed Granulation Amount: None Present (0%) Exposed Structure Necrotic Amount: Large (67-100%) Fascia Exposed: No Necrotic Quality: Eschar Fat Layer (Subcutaneous Tissue) Exposed: Yes Tendon Exposed: No Muscle Exposed: No Joint Exposed: No Bone Exposed: No Electronic Signature(s) Signed: 12/17/2020 5:41:23 PM By: Dellie Catholic RN Signed: 12/23/2020 5:53:22 PM By: Baruch Gouty RN, BSN Entered By: Dellie Catholic on 12/17/2020  09:47:05 -------------------------------------------------------------------------------- [place] Details Patient Name: Date of Service: [name], [name] 12/17/2020 9:00 A M Medical Record Number: [id number] Patient Account Number: [id number] Date of Birth/Sex: Treating RN: 04-23-1960 (60 y.o. [name] Primary Care Makenize Messman: Cipriano Mile Other Clinician: Referring Kylii Ennis: Treating Alfonzia Woolum/Extender: Jolyne Loa Weeks in Treatment: 0 Vital Signs Time Taken: 09:05 Temperature (F): 98.3 Height (in): 62 Pulse (bpm): 87 Source: Stated Respiratory Rate (breaths/min): 18 Weight (lbs): 120 Blood Pressure (mmHg): 144/97 Source: Stated Reference Range: 80 - 120 mg / dl Body Mass Index (BMI): 21.9 Electronic Signature(s) Signed: 12/17/2020 5:41:23 PM By: Dellie Catholic RN Entered By: Dellie Catholic on 12/17/2020 09:06:14

## 2020-12-24 NOTE — Progress Notes (Signed)
SADIQ, MCCAULEY ([id number]) Visit Report for 12/17/2020 Chief Complaint Document Details Patient Name: Date of Service: [name] 12/17/2020 9:00 A M Medical Record Number: [id number] Patient Account Number: [id number] Date of Birth/Sex: Treating RN: 1960-04-17 (60 y.o. [name] Primary Care Provider: Cipriano Mile Other Clinician: Referring Provider: Treating Provider/Extender: Jolyne Loa Weeks in Treatment: 0 Information Obtained from: Patient Chief Complaint Left amputation site ulcer Electronic Signature(s) Signed: 12/17/2020 10:04:40 AM By: Worthy Keeler PA-C Previous Signature: 12/17/2020 9:52:50 AM Version By: Worthy Keeler PA-C Entered By: Worthy Keeler on 12/17/2020 10:04:40 -------------------------------------------------------------------------------- HPI Details Patient Name: Date of Service: [name] RD, [name] 12/17/2020 9:00 A M Medical Record Number: [id number] Patient Account Number: [id number] Date of Birth/Sex: Treating RN: Feb 20, 1960 (60 y.o. [name] Primary Care Provider: Cipriano Mile Other Clinician: Referring Provider: Treating Provider/Extender: Jolyne Loa Weeks in Treatment: 0 History of Present Illness HPI Description: 08/15/2019 upon evaluation today patient presents for evaluation here in our clinic concerning issues that he is having with wounds on the dorsal surface of his right foot. This is something that was noted to occur after he was obtaining cortisone injections with [facility]. Subsequently he developed ulcerations he was then referred to Dr. Alvester Chou who performed arterial testing and subsequently found that the patient had poor arterial flow with an ABI of 0.6 and a TBI of 0.26. With that being said he has since on 07/16/2019 had an angiogram on the right with improvement of his ABI to 0.85 and TBI to 0.5. Obviously this is not perfect but is definitely far  superior to where it was previous. He does have some necrotic tissue on the surface of the wounds is going to need debriding away he has been using Silvadene on this but to be honest that is not can to do anything for these wounds. He needs something more to clear this away sharp debridement is ideal and we may even look towards Santyl as well. The patient does have a history of diabetes noted in his chart though is not on medication and his A1c was around 6.4 so is not terrible. With that being said he does have known peripheral vascular disease he is actually have an angiogram on the left tomorrow. He also has hypertension and he does smoke. 8/27; this is a patient has been here once before about 6 weeks ago. Since then he was hospitalized from 7/22 through 7/23 by Dr. Gwenlyn Found for an attempt at revascularization. He had previously been seen by Dr. Amalia Hailey of podiatry. He is a continued tobacco smoker. His angiogram on 6/21 via revealed a occluded SFAs bilaterally with one-vessel runoff. He had a atherectomy followed by a drug-coated balloon angioplasty of the right SFA. 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 He had some improvement in his right ABI from 0.6-0.85. There is also an attempt to open his left SFA but that was unsuccessful. He does not have a wound on the left side. We are using Santyl on the patient's wounds on the right dorsal foot. He says he has about a 1 minute exercise tolerance before stopping because of pain compatible with claudication 9/10; we are using Santyl on the wound on the right dorsal foot. Still has significant claudication symptoms. He is status post angioplasty of the right SFA and atherectomy. He has one-vessel runoff via a diseased peroneal. He follows up with  Dr. Gwenlyn Found on 9/14 9/17; I had my notes from last week that the patient was to see Dr. Gwenlyn Found on 9/14 he tells me that the appointment is actually on 9/21 although I do not see that  in epic. The next appointment I see with Dr. Gwenlyn Found is on 11/17. Nevertheless the patient is fairly adamant that his appointment is early next week I told him to call ahead before he goes. The wound is a lot better we have been using Santyl over that he just ran out of that all represcribe it. 10/1; patient arrives today with nothing on his wound. He says he has been using the Santyl but he feels the border foam is too painful. We told him he needs to put a covering on this of some sort perhaps gauze. Indeed after our discussion last time his appointment with Dr. Gwenlyn Found 11/17. Previously he has had a atherectomy and an angioplasty of the right SFA. I note in my previous notes it was said he had bilateral one-vessel runoff I'll need to review the tibial vessels on the angiogram. The patient is currently rating his pain at 8 out of 10 He followed up with Dr. Amalia Hailey at [facility] on 9/27. He emphasized to follow-up with vascular and [place]. Follow-up with them as needed. 10/15; patient ran out of Santyl and has been using Silvadene. He still has constant pain which I think is largely claudication. I have looked over his angiogram results. I need to communicate with Dr. Gwenlyn Found to see if anything else can be done here. He arrives in clinic today with tendon over most of the wound surface area 10/29; we are using silver collagen on the right foot. Still complaining of a lot of pain especially at night. Dr. Gwenlyn Found is out of town this week I will send him an email but he has a follow-up appointment on 11/17. 11/18; patient saw Dr. Gwenlyn Found yesterday. He has a history of a directional atherectomy followed by a drug-coated balloon angioplasty on the right SFA. 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 His right ABI increased from 0.6 2.85 however the right foot wound has not budged. He is going next Wednesday for an attempt to do tibial artery interventions. He  mentions that he has one-vessel runoff via the peroneal.. They are going to attempt to look at the anterior tibial and posterior tibial revascularization next Wednesday. If they were successful at this then I will consider debridement and changing the topical dressings to this wound. Patient is still in a lot of pain 12/2; the patient underwent revascularization by Dr. Fletcher Anon on 12/19/2019. 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 The posterior tibial artery gives good flow into the whole pedal arch getting collaterals from the peroneal artery the anterior tibial artery was occluded. Unfortunately his right anterior tibial artery is not amenable for revascularization. He had a drug-eluting stent placed in the right common iliac artery into the external iliac artery. The patient arrives saying his pain is about the same. He still has exposed tendon in the base of the wound however there is some healthy looking granulation at the circumference. Perhaps reason for some guarded optimism. Comes in today not wanting debridement asking for pain medication 02/22/2020; patient  has not been here in almost 2 months. He has a punched-out area on the right dorsal foot. Completely necrotic surface. We have been using silver collagen when he was last here the beginning of December he ran out of this a long time ago. I think he has been using Silvadene cream. He follows up with Dr. Fletcher Anon or Dr. Gwenlyn Found next week. He arrives in clinic as usual not wanting debridement unfortunately its debridement is what he needs 2/11; this area on the right foot looks somewhat better than when I saw this 2 weeks ago. I did receive a call from Dr. Gwenlyn Found who went over his PAD history. He had a directional atherectomy followed by drug coated  balloon angioplasty of his right SFA on 07/16/2019 his right ABI increased from 0.6-0.85 at that point. He underwent a right external iliac artery stenting by Dr. Fletcher Anon on 12/19/2019 his right SFA was patent as was his peroneal. He did not have a patent posterior tibial at the level of the ankle the only patent vessel to his foot is the anterior tibial artery although that is fortunate in that that is where the or the major wound is. He was not felt to be a candidate for any further angiographic intervention 03/28/2020 upon evaluation today patient appears to be doing well with regard to his wound. Is been tolerating the dressing changes without complication. With that being said he has been using Santyl this definitely seems to be doing better than nothing is for breakdown some of the slough and biofilm on the surface of the wound. Fortunately there is no evidence of active infection which is great news. No fevers, chills, nausea, vomiting, or diarrhea. 3/25; since the patient was last here I was called by Dr. Gwenlyn Found. They do not anticipate any more interventions at this point. I have listed his impressions on the right leg vasculature below " Mr. Tibbs returns today for follow-up. He follows with Dr. Dellia Nims at the wound care clinic for a slowly healing wound on the dorsal aspect of his right foot. He has had 2 interventions on this, one in July 2021 where I revascularized an occluded right SFA. He had one-vessel runoff. He did have spasm of his peroneal which ultimately was shown to have resolved repeat CT angiography. In November he had stenting of a calcified physiologically significant right external iliac artery. His anterior tibial artery does not reach past the ankle and therefore revascularization would be difficult. I would continue with aggressive local wound care. He also has claudication on the left. I attempted left SFA intervention in July but was unsuccessful in reentering the distal  lumen. Unfortunately, he only has 1 patent tibial vessel on the left common anterior tibial, and no evidence of critical limb ischemia so I am hesitant to recommend tibial pedal access for a retrograde approach." He has been using Santyl on the right dorsal foot. Arrives with the wound above 0.5 cm in depth the surface does not look too bad. He is also complaining about pain in the left fifth toe without any antecedent trauma he is aware of. Says that the pain has been there for the last 5 or 6 weeks 4/15; the area on his right dorsal foot perhaps slightly better. We have been using Santyl. Dr. Kennon Holter last opinions on his vascular supply are listed in my note of 04/18/2020. Again he complains of pain in the left fifth toe. We ordered an x-ray of this almost 3 weeks ago. He did  not get it done 4/29; patient has been using Santyl to the right dorsal foot wound. He reports improvement to the wound. He denies signs and symptoms of infection. He was able to obtain the left foot x-ray that was ordered because he was complaining about toe pain at last clinic visit. 5/26; patient was last seen a month ago. He continue to use Santyl over the last month to his right dorsal foot wound. He reports that it is closed. Readmission: 12/17/2020 upon evaluation today patient appears for reevaluation here in the clinic its been since last May since we have seen him. 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 Unfortunately despite all this his blood flow is still very poor. In fact his follow-up ABIs after his intervention with the aortobifemoral bypass graft on 10/09/2020 were still not good. His right ABI was 0.81 with a TBI absent. His left ABI was 0.37 with a TBI absent. This indicated severe left lower extremity arterial disease with no toe brachial index able to be obtained and again there is mild right lower extremity  disease at least and this may be falsely elevated. Either way it appears his arterial flow is still very problematic. I discussed that with him today as well. Subsequently more than wound care I think he really needs vascular follow-up he tells me he does see them on the 30th of this month. Electronic Signature(s) Signed: 12/17/2020 10:15:37 AM By: Worthy Keeler PA-C Entered By: Worthy Keeler on 12/17/2020 10:15:37 -------------------------------------------------------------------------------- Physical Exam Details Patient Name: Date of Service: [place] RD, [name] 12/17/2020 9:00 A M Medical Record Number: [id number] Patient Account Number: [id number] Date of Birth/Sex: Treating RN: 01-Jul-1960 (60 y.o. [name] Primary Care Provider: Cipriano Mile Other Clinician: Referring Provider: Treating Provider/Extender: Jolyne Loa Weeks in Treatment: 0 Constitutional patient is hypertensive.. pulse regular and within target range for patient.Marland Kitchen respirations regular, non-labored and within target range for patient.Marland Kitchen temperature within target range for patient.. Well-nourished and well-hydrated in no acute distress. Eyes conjunctiva clear no eyelid edema noted. pupils equal round and reactive to light and accommodation. Ears, Nose, Mouth, and Throat no gross abnormality of ear auricles or external auditory canals. normal hearing noted during conversation. mucus membranes moist. Respiratory normal breathing without difficulty. Cardiovascular Absent posterior tibial and dorsalis pedis pulses bilateral lower extremities. trace pitting edema of the bilateral lower extremities. Musculoskeletal normal gait and posture. no significant deformity or arthritic changes, no loss or range of motion, no clubbing. Psychiatric this patient is able to make decisions and demonstrates good insight into disease process. Alert and Oriented x 3. pleasant and cooperative. Notes Upon  inspection patient's wound bed actually showed signs of being very dry again I am not exactly sure what is even on open underneath the foot region. There could be a wound or this might not be a wound but I am not inclined to push things and try to perform any type of debridement to see what is underneath being that his blood flow is so poor. I think I could just get things started up and then he ends up with a bigger amputation. I do not want to proceed down that road. Subsequently I think for the time being my goal would be to have him go back and see vascular and the biggest thing in the meantime is keeping this covered with Betadine  paints daily followed by dry gauze dressings. Electronic Signature(s) Signed: 12/17/2020 10:16:40 AM By: Worthy Keeler PA-C Entered By: Worthy Keeler on 12/17/2020 10:16:40 -------------------------------------------------------------------------------- Physician Orders Details Patient Name: Date of Service: [name], [name] 12/17/2020 9:00 A M Medical Record Number: [id number] Patient Account Number: [id number] Date of Birth/Sex: Treating RN: 06/26/1960 (60 y.o. 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 Margarita Grizzle Return A Other: - [facility] 12/24/20 Bathing/ Shower/ Hygiene May shower with protection but do not get wound dressing(s) wet. Home Health New wound care orders this week; continue  Home Health for wound care. May utilize formulary equivalent dressing for wound treatment orders unless otherwise specified. - Betadine to Left 5th met head daily. Cover with gauze and tape Wound Treatment Wound #3 - Amputation Site - Toe Wound Laterality: Left Cleanser: Soap and Water (Home Health) 1 x Per Day/15 Days Discharge Instructions: May shower and wash wound with dial antibacterial soap and water prior to dressing change. Cleanser: Wound Cleanser (Home Health) 1 x Per Day/15 Days Discharge Instructions: Cleanse the wound with wound cleanser prior to applying a clean dressing using gauze sponges, not tissue or cotton balls. Prim Dressing: betadine (Home Health) 1 x Per Day/15 Days ary Secondary Dressing: Woven Gauze Sponge, Non-Sterile 4x4 in (Home Health) 1 x Per Day/15 Days Discharge Instructions: Apply over primary dressing as directed. Secured With: 29M Medipore H Soft Cloth Surgical T ape, 4 x 10 (in/yd) (Home Health) 1 x Per Day/15 Days Discharge Instructions: Secure with tape as directed. Electronic Signature(s) Signed: 12/17/2020 3:00:15 PM By: Rhae Hammock RN Signed: 12/17/2020 5:33:12 PM By: Worthy Keeler PA-C Entered By: Rhae Hammock on 12/17/2020 10:18:29 -------------------------------------------------------------------------------- Problem List Details Patient Name: Date of Service: [name], [name] 12/17/2020 9:00 A M Medical Record Number: [id number] Patient Account Number: [id number] Date of Birth/Sex: Treating RN: March 06, 1960 (60 y.o. [name] Primary Care Provider: Cipriano Mile Other Clinician: Referring Provider: Treating Provider/Extender: Jolyne Loa Weeks in Treatment: 0 Active Problems ICD-10 Encounter Code Description Active Date MDM Diagnosis I73.89 Other specified peripheral vascular diseases 12/17/2020 No Yes E11.621 Type 2 diabetes mellitus with foot ulcer 12/17/2020 No Yes Z89.422 Acquired  absence of other left toe(s) 12/17/2020 No Yes L97.528 Non-pressure chronic ulcer of other part of left foot with other specified 12/17/2020 No Yes severity F17.218 Nicotine dependence, cigarettes, with other nicotine-induced disorders 12/17/2020 No Yes Inactive Problems Resolved Problems Electronic Signature(s) Signed: 12/17/2020 10:03:07 AM By: Worthy Keeler PA-C Previous Signature: 12/17/2020 9:52:39 AM Version By: Worthy Keeler PA-C Entered By: Worthy Keeler on 12/17/2020 10:03:07 -------------------------------------------------------------------------------- Progress Note Details Patient Name: Date of Service: [name], [name] 12/17/2020 9:00 [place] Record Number: [id number] Patient Account Number: [id number] Date of Birth/Sex: Treating RN: Nov 19, 1960 (60 y.o. [name] Primary Care Provider: Cipriano Mile Other Clinician: Referring Provider: Treating Provider/Extender: Jolyne Loa Weeks in Treatment: 0 Subjective Chief Complaint Information obtained from Patient Left amputation site ulcer History of Present Illness (HPI) 08/15/2019 upon  evaluation today patient presents for evaluation here in our clinic concerning issues that he is having with wounds on the dorsal surface of his right foot. This is something that was noted to occur after he was obtaining cortisone injections with [facility]. Subsequently he developed ulcerations he was then referred to Dr. Alvester Chou who performed arterial testing and subsequently found that the patient had poor arterial flow with an ABI of 0.6 and a TBI of 0.26. With that being said he has since on 07/16/2019 had an angiogram on the right with improvement of his ABI to 0.85 and TBI to 0.5. Obviously this is not perfect but is definitely far superior to where it was previous. He does have some necrotic tissue on the surface of the wounds is going to need debriding away he has been using Silvadene on this  but to be honest that is not can to do anything for these wounds. He needs something more to clear this away sharp debridement is ideal and we may even look towards Santyl as well. The patient does have a history of diabetes noted in his chart though is not on medication and his A1c was around 6.4 so is not terrible. With that being said he does have known peripheral vascular disease he is actually have an angiogram on the left tomorrow. He also has hypertension and he does smoke. 8/27; this is a patient has been here once before about 6 weeks ago. Since then he was hospitalized from 7/22 through 7/23 by Dr. Gwenlyn Found for an attempt at revascularization. He had previously been seen by Dr. Amalia Hailey of podiatry. He is a continued tobacco smoker. His angiogram on 6/21 via revealed a occluded SFAs bilaterally with one-vessel runoff. He had a atherectomy followed by a drug-coated balloon angioplasty of the right SFA. 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 He had some improvement in his right ABI from 0.6-0.85. There is also an attempt to open his left SFA but that was unsuccessful. He does not have a wound on the left side. We are using Santyl on the patient's wounds on the right dorsal foot. He says he has about a 1 minute exercise tolerance before stopping because of pain compatible with claudication 9/10; we are using Santyl on the wound on the right dorsal foot. Still has significant claudication symptoms. He is status post angioplasty of the right SFA and atherectomy. He has one-vessel runoff via a diseased peroneal. He follows up with Dr. Gwenlyn Found on 9/14 9/17; I had my notes from last week that the patient was to see Dr. Gwenlyn Found on 9/14 he tells me that the appointment is actually on 9/21 although I do not see that in epic. The next appointment I see with Dr. Gwenlyn Found is on 11/17. Nevertheless the patient is fairly adamant that his appointment is early next week I told him to call  ahead before he goes. The wound is a lot better we have been using Santyl over that he just ran out of that all represcribe it. 10/1; patient arrives today with nothing on his wound. He says he has been using the Santyl but he feels the border foam is too painful. We told him he needs to put a covering on this of some sort perhaps gauze. Indeed after our discussion last time his appointment with Dr. Gwenlyn Found 11/17. Previously he has had a atherectomy and an angioplasty of the right SFA. I note in my previous notes  it was said he had bilateral one-vessel runoff I'll need to review the tibial vessels on the angiogram. The patient is currently rating his pain at 8 out of 10 He followed up with Dr. Amalia Hailey at [facility] on 9/27. He emphasized to follow-up with vascular and [place]. Follow-up with them as needed. 10/15; patient ran out of Santyl and has been using Silvadene. He still has constant pain which I think is largely claudication. I have looked over his angiogram results. I need to communicate with Dr. Gwenlyn Found to see if anything else can be done here. He arrives in clinic today with tendon over most of the wound surface area 10/29; we are using silver collagen on the right foot. Still complaining of a lot of pain especially at night. Dr. Gwenlyn Found is out of town this week I will send him an email but he has a follow-up appointment on 11/17. 11/18; patient saw Dr. Gwenlyn Found yesterday. He has a history of a directional atherectomy followed by a drug-coated balloon angioplasty on the right SFA. 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 His right ABI increased from 0.6 2.85 however the right foot wound has not budged. He is going next Wednesday for an attempt to do tibial artery interventions. He mentions that he has one-vessel runoff via the peroneal.. They are going to attempt to look at the anterior tibial and posterior tibial revascularization next Wednesday.  If they were successful at this then I will consider debridement and changing the topical dressings to this wound. Patient is still in a lot of pain 12/2; the patient underwent revascularization by Dr. Fletcher Anon on 12/19/2019. 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 The posterior tibial artery gives good flow into the whole pedal arch getting collaterals from the peroneal artery the anterior tibial artery was occluded. Unfortunately his right anterior tibial artery is not amenable for revascularization. He had a drug-eluting stent placed in the right common iliac artery into the external iliac artery. The patient arrives saying his pain is about the same. He still has exposed tendon in the base of the wound however there is some healthy looking granulation at the circumference. Perhaps reason for some guarded optimism. Comes in today not wanting debridement asking for pain medication 02/22/2020; patient has not been here in almost 2 months. He has a punched-out area on the right dorsal foot. Completely necrotic surface. We have been using silver collagen when he was last here the beginning of December he ran out of this a long time ago. I think he has been using Silvadene cream. He follows up with Dr. Fletcher Anon or Dr. Gwenlyn Found next week. He arrives in clinic as usual not wanting debridement unfortunately its debridement is what he needs 2/11; this area on the right foot looks somewhat better than when I saw this 2 weeks ago. I did receive a call from Dr. Gwenlyn Found who went over his PAD history. He had a directional atherectomy followed by drug coated balloon angioplasty of his right SFA on 07/16/2019 his right ABI increased from 0.6-0.85 at that point. He underwent a right external iliac artery stenting by Dr. Fletcher Anon on  12/19/2019 his right SFA was patent as was his peroneal. He did not have a patent posterior tibial  at the level of the ankle the only patent vessel to his foot is the anterior tibial artery although that is fortunate in that that is where the or the major wound is. He was not felt to be a candidate for any further angiographic intervention 03/28/2020 upon evaluation today patient appears to be doing well with regard to his wound. Is been tolerating the dressing changes without complication. With that being said he has been using Santyl this definitely seems to be doing better than nothing is for breakdown some of the slough and biofilm on the surface of the wound. Fortunately there is no evidence of active infection which is great news. No fevers, chills, nausea, vomiting, or diarrhea. 3/25; since the patient was last here I was called by Dr. Gwenlyn Found. They do not anticipate any more interventions at this point. I have listed his impressions on the right leg vasculature below " [name] returns today for follow-up. He follows with Dr. Dellia Nims at the wound care clinic for a slowly healing wound on the dorsal aspect of his right foot. He has had 2 interventions on this, one in July 2021 where I revascularized an occluded right SFA. He had one-vessel runoff. He did have spasm of his peroneal which ultimately was shown to have resolved repeat CT angiography. In November he had stenting of a calcified physiologically significant right external iliac artery. His anterior tibial artery does not reach past the ankle and therefore revascularization would be difficult. I would continue with aggressive local wound care. He also has claudication on the left. I attempted left SFA intervention in July but was unsuccessful in reentering the distal lumen. Unfortunately, he only has 1 patent tibial vessel on the left common anterior tibial, and no evidence of critical limb ischemia so I am hesitant to recommend tibial  pedal access for a retrograde approach." He has been using Santyl on the right dorsal foot. Arrives with the wound above 0.5 cm in depth the surface does not look too bad. He is also complaining about pain in the left fifth toe without any antecedent trauma he is aware of. Says that the pain has been there for the last 5 or 6 weeks 4/15; the area on his right dorsal foot perhaps slightly better. We have been using Santyl. Dr. Kennon Holter last opinions on his vascular supply are listed in my note of 04/18/2020. Again he complains of pain in the left fifth toe. We ordered an x-ray of this almost 3 weeks ago. He did not get it done 4/29; patient has been using Santyl to the right dorsal foot wound. He reports improvement to the wound. He denies signs and symptoms of infection. He was able to obtain the left foot x-ray that was ordered because he was complaining about toe pain at last clinic visit. 5/26; patient was last seen a month ago. He continue to use Santyl over the last month to his right dorsal foot wound. He reports that it is closed. Readmission: 12/17/2020 upon evaluation today patient appears for reevaluation here in the clinic its been since last May since we have seen him. 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 Unfortunately despite all this his blood flow is still very poor. In fact his follow-up ABIs after his intervention with the aortobifemoral bypass graft on 10/09/2020 were still not good. His right ABI was 0.81 with a TBI absent. His  left ABI was 0.37 with a TBI absent. 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 Either way it appears his arterial flow is still very problematic. I discussed that with him today as well. Subsequently  more than wound care I think he really needs vascular follow-up he tells me he does see them on the 30th of this month. Patient History Information obtained from Patient. Allergies No Known Drug Allergies Family History Diabetes - Siblings, Heart Disease - Father, No family history of Cancer, Hereditary Spherocytosis, Hypertension, Kidney Disease, Lung Disease, Seizures, Stroke, Thyroid Problems, Tuberculosis. Social History Light tobacco smoker - occasional smoker, Marital Status - Separated, Alcohol Use - Moderate, Drug Use - No History, Caffeine Use - Rarely. 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 Sherryle Lis podiatry. Medical A Surgical History Notes nd Cardiovascular Hyperlipidemia Objective Constitutional patient is hypertensive.. pulse regular and within target range for patient.Marland Kitchen respirations regular, non-labored and within target range for patient.Marland Kitchen temperature within target range for patient.. Well-nourished and well-hydrated in no acute distress. Vitals Time Taken: 9:05 AM, Height: 62 in, Source: Stated, Weight: 120 lbs, Source: Stated, BMI: 21.9, Temperature: 98.3 F, Pulse: 87 bpm, Respiratory Rate: 18 breaths/min, Blood Pressure: 144/97 mmHg. Eyes conjunctiva clear no eyelid edema noted. pupils equal round and reactive to light and accommodation. Ears, Nose, Mouth, and Throat no gross abnormality of ear auricles or external auditory canals. normal hearing noted during conversation. mucus membranes moist. Respiratory normal breathing without difficulty. Cardiovascular Absent posterior tibial and dorsalis pedis pulses bilateral lower extremities. trace pitting edema of the bilateral lower extremities. Musculoskeletal normal gait and posture. no  significant deformity or arthritic changes, no loss or range of motion, no clubbing. Psychiatric this patient is able to make decisions and demonstrates good insight into disease process. Alert and Oriented x 3. pleasant and cooperative. General Notes: Upon inspection patient's wound bed actually showed signs of being very dry again I am not exactly sure what is even on open underneath the foot region. There could be a wound or this might not be a wound but I am not inclined to push things and try to perform any type of debridement to see what is underneath being that his blood flow is so poor. I think I could just get things started up and then he ends up with a bigger amputation. I do not want to proceed down that road. Subsequently I think for the time being my goal would be to have him go back and see vascular and the biggest thing in the meantime is keeping this covered with Betadine paints daily followed by dry gauze dressings. Integumentary (Hair, Skin) Wound #3 status is Open. Original cause of wound was Not Known. The date acquired was: 09/25/2020. The wound is located on the Left Amputation Site - T oe. The wound measures 0.7cm length x 0.7cm width x 0.1cm depth; 0.385cm^2 area and 0.038cm^3 volume. There is Fat Layer (Subcutaneous Tissue) exposed. There is a medium amount of serosanguineous drainage noted. The wound margin is flat and intact. There is no granulation within the wound bed. There is a large (67-100%) amount of necrotic tissue within the wound bed including Eschar. 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 Would recommend currently that we go ahead and continue with the wound care measures as before  and the patient is in agreement with the plan. This includes the use of the Betadine paints to the wound bed followed by dry gauze dressing to be changed daily. 2. Also can recommend that we have the patient continue to monitor for any signs of worsening if he develops any fevers, chills, nausea, vomiting, diarrhea, or increased redness or pain of the wound past what it is now he should go to the ER ASAP. 3. Otherwise the main thing he needs to do is follow-up with vascular ASAP to discuss further treatment options. We will see patient back for reevaluation in 2 weeks here in the clinic. If anything worsens or changes patient will contact our office for additional recommendations. Electronic Signature(s) Signed: 12/17/2020 10:17:26 AM By: Worthy Keeler PA-C Entered By: Worthy Keeler on 12/17/2020 10:17:26 -------------------------------------------------------------------------------- HxROS Details Patient Name: Date of Service: [name] RD, [name] 12/17/2020 9:00 A M Medical Record Number: [id number] Patient Account Number: [id number] Date of Birth/Sex: Treating RN: Feb 21, 1960 (60 y.o. [name] Primary Care Provider: Cipriano Mile Other Clinician: Referring Provider: Treating Provider/Extender: Jolyne Loa Weeks in Treatment: 0 Information Obtained From Patient Cardiovascular Medical History: Positive for: Hypertension; Peripheral Arterial Disease Past Medical History Notes: Hyperlipidemia Endocrine Medical History: Positive for: Type II Diabetes Treated with: Diet Blood sugar tested every day: No Neurologic Medical History: Positive for: Neuropathy Immunizations Pneumococcal Vaccine: Received Pneumococcal Vaccination: No Implantable Devices None Hospitalization / Surgery History Type of Hospitalization/Surgery 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 Sherryle Lis podiatry Family and Social History Cancer: No; Diabetes: Yes - Siblings;  Heart Disease: Yes - Father; Hereditary Spherocytosis: No; Hypertension: No; Kidney Disease: No; Lung Disease: No; Seizures: No; Stroke: No; Thyroid Problems: No; Tuberculosis: No; Light tobacco smoker - occasional smoker; Marital Status - Separated; Alcohol Use: Moderate; Drug Use: No History; Caffeine Use: Rarely; Financial Concerns: No; Food, Clothing or Shelter Needs: No; Support System Lacking: No; Transportation Concerns: No Electronic Signature(s) Signed: 12/17/2020 5:33:12 PM By: Worthy Keeler PA-C Signed: 12/17/2020 5:41:23 PM By: Dellie Catholic RN Signed: 12/23/2020 5:53:22 PM By: Baruch Gouty RN, BSN Entered By: Dellie Catholic on 12/17/2020 09:09:02 -------------------------------------------------------------------------------- SuperBill Details Patient Name: Date of Service: [name], [name] 12/17/2020 Medical Record Number: [id number] Patient Account Number: [id number] Date of Birth/Sex: Treating RN: 04/15/1960 (60 y.o. [name] Primary Care Provider: Cipriano Mile Other Clinician: Referring Provider: Treating Provider/Extender: Jolyne Loa Weeks in Treatment: 0 Diagnosis Coding ICD-10 Codes Code Description I73.89 Other specified peripheral vascular diseases E11.621 Type 2 diabetes mellitus with foot ulcer Z89.422 Acquired absence of other left toe(s) L97.528 Non-pressure chronic ulcer of other part of left foot with other specified severity F17.218 Nicotine dependence, cigarettes, with other nicotine-induced disorders Facility Procedures CPT4 Code: 17408144 Description: 99214 - WOUND CARE VISIT-LEV 4 EST PT Modifier: Quantity: 1 Physician Procedures : CPT4 Code Description Modifier [id number] 99214 - WC PHYS LEVEL 4 - EST PT ICD-10 Diagnosis Description I73.89 Other specified peripheral vascular diseases E11.621 Type 2 diabetes mellitus with foot ulcer Z89.422 Acquired absence of other left toe(s)  L97.528 Non-pressure chronic  ulcer of other part of left foot  with other specified severity Quantity: 1 Electronic Signature(s) Signed: 12/23/2020 5:53:22 PM By: Baruch Gouty RN, BSN Signed: 12/24/2020 6:11:43 PM By: Worthy Keeler PA-C Previous Signature: 12/17/2020 10:17:39 AM Version By: Worthy Keeler PA-C Entered By: Baruch Gouty on 12/23/2020 17:21:42

## 2020-12-26 ENCOUNTER — Other Ambulatory Visit: Payer: Self-pay

## 2020-12-26 ENCOUNTER — Ambulatory Visit (INDEPENDENT_AMBULATORY_CARE_PROVIDER_SITE_OTHER): Payer: Medicare Other | Admitting: Podiatry

## 2020-12-26 ENCOUNTER — Encounter: Payer: Self-pay | Admitting: Podiatry

## 2020-12-26 DIAGNOSIS — I739 Peripheral vascular disease, unspecified: Secondary | ICD-10-CM

## 2020-12-26 DIAGNOSIS — Z89422 Acquired absence of other left toe(s): Secondary | ICD-10-CM | POA: Diagnosis not present

## 2020-12-26 DIAGNOSIS — E119 Type 2 diabetes mellitus without complications: Secondary | ICD-10-CM | POA: Diagnosis not present

## 2020-12-26 DIAGNOSIS — M79675 Pain in left toe(s): Secondary | ICD-10-CM | POA: Diagnosis not present

## 2020-12-26 DIAGNOSIS — B351 Tinea unguium: Secondary | ICD-10-CM | POA: Diagnosis not present

## 2020-12-26 DIAGNOSIS — M79674 Pain in right toe(s): Secondary | ICD-10-CM

## 2020-12-26 MED ORDER — OXYCODONE-ACETAMINOPHEN 5-325 MG PO TABS
1.0000 | ORAL_TABLET | ORAL | 0 refills | Status: DC | PRN
Start: 2020-12-26 — End: 2022-12-13

## 2020-12-26 NOTE — Progress Notes (Signed)
This patient returns to my office for at risk foot care.  This patient requires this care by a professional since this patient will be at risk due to having  PVD ,claudication and diabetes with amputation fifth row left foot.  This patient is unable to cut nails himself since the patient cannot reach his nails.These nails are painful walking and wearing shoes.  This patient presents for at risk foot care today.  General Appearance  Alert, conversant and in no acute stress.  Vascular  Dorsalis pedis and posterior tibial  pulses are absent bilaterally.  Capillary return is within normal limits  bilaterally. Cold feet bilaterally.  Absent digital hair  B/L.  Neurologic  deferred since he was experiencing pain out of proportion.   Nails Thick disfigured discolored nails with subungual debris  from hallux to fifth toes bilaterally. No evidence of bacterial infection or drainage bilaterally.  Orthopedic  No limitations of motion  feet .  No crepitus or effusions noted.  No bony pathology or digital deformities noted.  Amputation fifth toe left foot.  Skin  normotropic skin with no porokeratosis noted bilaterally.  No signs of infections or ulcers noted.     Onychomycosis  Pain in right toes  Pain in left toes  Consent was obtained for treatment procedures.   Mechanical debridement of nails 1-5  bilaterally performed with a nail nipper.  Filed with dremel without incident. Patient was in severe pain to the touch of his foot and during nail debridement especially left leg. I contacted Dr.  Posey Pronto who evaluated this patient.  He prescribed percocet for this patient and told him to reappoint with his vascular doctor.   Return office visit    prn                 Told patient to return for periodic foot care and evaluation due to potential at risk complications.   Gardiner Barefoot DPM

## 2020-12-30 ENCOUNTER — Ambulatory Visit: Payer: Medicare Other

## 2020-12-30 ENCOUNTER — Encounter (HOSPITAL_COMMUNITY): Payer: 59

## 2020-12-30 ENCOUNTER — Inpatient Hospital Stay (HOSPITAL_COMMUNITY): Admission: RE | Admit: 2020-12-30 | Payer: 59 | Source: Ambulatory Visit

## 2020-12-31 ENCOUNTER — Encounter (HOSPITAL_BASED_OUTPATIENT_CLINIC_OR_DEPARTMENT_OTHER): Payer: 59 | Attending: Physician Assistant | Admitting: Physician Assistant

## 2021-01-07 ENCOUNTER — Encounter (HOSPITAL_BASED_OUTPATIENT_CLINIC_OR_DEPARTMENT_OTHER): Payer: 59 | Attending: Internal Medicine | Admitting: Internal Medicine

## 2021-01-14 ENCOUNTER — Other Ambulatory Visit: Payer: Self-pay

## 2021-01-14 ENCOUNTER — Encounter (HOSPITAL_BASED_OUTPATIENT_CLINIC_OR_DEPARTMENT_OTHER): Payer: 59 | Attending: Physician Assistant | Admitting: Physician Assistant

## 2021-01-14 DIAGNOSIS — E1151 Type 2 diabetes mellitus with diabetic peripheral angiopathy without gangrene: Secondary | ICD-10-CM | POA: Diagnosis present

## 2021-01-14 DIAGNOSIS — I10 Essential (primary) hypertension: Secondary | ICD-10-CM | POA: Diagnosis not present

## 2021-01-14 DIAGNOSIS — E11621 Type 2 diabetes mellitus with foot ulcer: Secondary | ICD-10-CM | POA: Diagnosis not present

## 2021-01-14 DIAGNOSIS — F17218 Nicotine dependence, cigarettes, with other nicotine-induced disorders: Secondary | ICD-10-CM | POA: Diagnosis not present

## 2021-01-14 DIAGNOSIS — Z89422 Acquired absence of other left toe(s): Secondary | ICD-10-CM | POA: Diagnosis not present

## 2021-01-14 DIAGNOSIS — L97528 Non-pressure chronic ulcer of other part of left foot with other specified severity: Secondary | ICD-10-CM | POA: Diagnosis not present

## 2021-01-14 NOTE — Progress Notes (Signed)
NIXON, KOLTON ([id number]) Visit Report for 01/14/2021 Arrival Information Details Patient Name: Date of Service: [place] 01/14/2021 8:45 A M Medical Record Number: [id number] Patient Account Number: [id number] Date of Birth/Sex: Treating RN: 20-Oct-1960 (60 y.o. Ulyses Amor, Vaughan Basta Primary Care Raymie Giammarco: Cipriano Mile Other Clinician: Referring Kynlie Jane: Treating Lamisha Roussell/Extender: Jolyne Loa Weeks in Treatment: 4 Visit Information History Since Last Visit Added or deleted any medications: No Patient Arrived: [name] Any new allergies or adverse reactions: No Arrival Time: 09:01 Had a fall or experienced change in No Accompanied By: self activities of daily living that may affect Transfer Assistance: None risk of falls: Patient Identification Verified: Yes Signs or symptoms of abuse/neglect since last visito No Secondary Verification Process Completed: Yes Hospitalized since last visit: No Patient Requires Transmission-Based Precautions: No Implantable device outside of the clinic excluding No Patient Has Alerts: No cellular tissue based products placed in the center since last visit: Has Dressing in Place as Prescribed: Yes Pain Present Now: Yes Electronic Signature(s) Signed: 01/14/2021 6:08:54 PM By: Baruch Gouty RN, BSN Entered By: Baruch Gouty on 01/14/2021 09:03:53 -------------------------------------------------------------------------------- Clinic Level of Care Assessment Details Patient Name: Date of Service: [place] RD, [name] 01/14/2021 8:45 A M Medical Record Number: [id number] Patient Account Number: [id number] Date of Birth/Sex: Treating RN: 12-25-1960 (60 y.o. [name] Primary Care Allison Deshotels: Cipriano Mile Other Clinician: Referring Misti Towle: Treating Tahiri Shareef/Extender: Jolyne Loa Weeks in Treatment: 4 Clinic Level of Care Assessment Items TOOL 4 Quantity Score X- 1 0 Use when only an  EandM is performed on FOLLOW-UP visit ASSESSMENTS - Nursing Assessment / Reassessment X- 1 10 Reassessment of Co-morbidities (includes updates in patient status) X- 1 5 Reassessment of Adherence to Treatment Plan ASSESSMENTS - Wound and Skin A ssessment / Reassessment X - Simple Wound Assessment / Reassessment - one wound 1 5 []  - 0 Complex Wound Assessment / Reassessment - multiple wounds X- 1 10 Dermatologic / Skin Assessment (not related to wound area) ASSESSMENTS - Focused Assessment X- 1 5 Circumferential Edema Measurements - multi extremities X- 1 10 Nutritional Assessment / Counseling / Intervention []  - 0 Lower Extremity Assessment (monofilament, tuning fork, pulses) []  - 0 Peripheral Arterial Disease Assessment (using hand held doppler) ASSESSMENTS - Ostomy and/or Continence Assessment and Care []  - 0 Incontinence Assessment and Management []  - 0 Ostomy Care Assessment and Management (repouching, etc.) PROCESS - Coordination of Care X - Simple Patient / Family Education for ongoing care 1 15 []  - 0 Complex (extensive) Patient / Family Education for ongoing care X- 1 10 Staff obtains Programmer, systems, Records, T Results / Process Orders est []  - 0 Staff telephones HHA, Nursing Homes / Clarify orders / etc []  - 0 Routine Transfer to another Facility (non-emergent condition) []  - 0 Routine Hospital Admission (non-emergent condition) []  - 0 New Admissions / Biomedical engineer / Ordering NPWT Apligraf, etc. , []  - 0 Emergency Hospital Admission (emergent condition) X- 1 10 Simple Discharge Coordination []  - 0 Complex (extensive) Discharge Coordination PROCESS - Special Needs []  - 0 Pediatric / Minor Patient Management []  - 0 Isolation Patient Management []  - 0 Hearing / Language / Visual special needs []  - 0 Assessment of Community assistance (transportation, D/C planning, etc.) []  - 0 Additional assistance / Altered mentation []  - 0 Support Surface(s)  Assessment (bed, cushion, seat, etc.) INTERVENTIONS - Wound Cleansing / Measurement X - Simple Wound Cleansing - one wound 1 5 []  - 0  Complex Wound Cleansing - multiple wounds X- 1 5 Wound Imaging (photographs - any number of wounds) []  - 0 Wound Tracing (instead of photographs) X- 1 5 Simple Wound Measurement - one wound []  - 0 Complex Wound Measurement - multiple wounds INTERVENTIONS - Wound Dressings []  - 0 Small Wound Dressing one or multiple wounds []  - 0 Medium Wound Dressing one or multiple wounds []  - 0 Large Wound Dressing one or multiple wounds []  - 0 Application of Medications - topical []  - 0 Application of Medications - injection INTERVENTIONS - Miscellaneous []  - 0 External ear exam []  - 0 Specimen Collection (cultures, biopsies, blood, body fluids, etc.) []  - 0 Specimen(s) / Culture(s) sent or taken to Lab for analysis []  - 0 Patient Transfer (multiple staff / Civil Service fast streamer / Similar devices) []  - 0 Simple Staple / Suture removal (25 or less) []  - 0 Complex Staple / Suture removal (26 or more) []  - 0 Hypo / Hyperglycemic Management (close monitor of Blood Glucose) []  - 0 Ankle / Brachial Index (ABI) - do not check if billed separately X- 1 5 Vital Signs Has the patient been seen at the hospital within the last three years: Yes Total Score: 100 Level Of Care: New/Established - Level 3 Electronic Signature(s) Signed: 01/14/2021 6:10:28 PM By: Deon Pilling RN, BSN Entered By: Deon Pilling on 01/14/2021 09:54:15 -------------------------------------------------------------------------------- Encounter Discharge Information Details Patient Name: Date of Service: [name] 01/14/2021 8:45 A M Medical Record Number: [id number] Patient Account Number: [id number] Date of Birth/Sex: Treating RN: 1960-09-26 (60 y.o. [name] Primary Care Breandan People: Cipriano Mile Other Clinician: Referring Sharonlee Nine: Treating Simeon Vera/Extender: Jolyne Loa Weeks in Treatment: 4 Encounter Discharge Information Items Discharge Condition: Stable Ambulatory Status: Cane Discharge Destination: Home Transportation: Private Auto Accompanied By: self Schedule Follow-up Appointment: No Clinical Summary of Care: Electronic Signature(s) Signed: 01/14/2021 6:10:28 PM By: Deon Pilling RN, BSN Entered By: Deon Pilling on 01/14/2021 09:53:38 -------------------------------------------------------------------------------- Lower Extremity Assessment Details Patient Name: Date of Service: [name] 01/14/2021 8:45 A M Medical Record Number: [id number] Patient Account Number: [id number] Date of Birth/Sex: Treating RN: August 23, 1960 (60 y.o. [name] Primary Care Yevette Knust: Cipriano Mile Other Clinician: Referring Lorn Butcher: Treating Davinci Glotfelty/Extender: Jolyne Loa Weeks in Treatment: 4 Edema Assessment Assessed: [Left: No] [Right: No] Edema: [Left: N] [Right: o] Calf Left: Right: Point of Measurement: 28.2 cm From Medial Instep 25.3 cm Ankle Left: Right: Point of Measurement: 9 cm From Medial Instep 19.2 cm Vascular Assessment Pulses: Dorsalis Pedis Palpable: [Left:Yes] Electronic Signature(s) Signed: 01/14/2021 6:08:54 PM By: Baruch Gouty RN, BSN Entered By: Baruch Gouty on 01/14/2021 09:07:41 -------------------------------------------------------------------------------- Multi-Disciplinary Care Plan Details Patient Name: Date of Service: [name], [name] 01/14/2021 8:45 A M Medical Record Number: [id number] Patient Account Number: [id number] Date of Birth/Sex: Treating RN: Dec 23, 1960 (60 y.o. [name] Primary Care Shanessa Hodak: Cipriano Mile Other Clinician: Referring Vicy Medico: Treating Staphanie Harbison/Extender: Jolyne Loa Weeks in Treatment: [place] reviewed with physician Active Inactive Electronic Signature(s) Signed:  01/14/2021 6:08:54 PM By: Baruch Gouty RN, BSN Signed: 01/14/2021 6:10:28 PM By: Deon Pilling RN, BSN Entered By: Deon Pilling on 01/14/2021 09:42:02 -------------------------------------------------------------------------------- Pain Assessment Details Patient Name: Date of Service: [name] 01/14/2021 8:45 A M Medical Record Number: [id number] Patient Account Number: [id number] Date of Birth/Sex: Treating RN: October 06, 1960 (60 y.o. [name] Primary Care Ruwayda Curet: Cipriano Mile Other Clinician: Referring Lavarius Doughten:  Treating Ellery Tash/Extender: Jolyne Loa Weeks in Treatment: 4 Active Problems Location of Pain Severity and Description of Pain Patient Has Paino Yes Site Locations Pain Location: Pain Location: Pain in Ulcers With Dressing Change: Yes Duration of the Pain. Constant / Intermittento Constant Rate the pain. Current Pain Level: 8 Worst Pain Level: 9 Least Pain Level: 5 Character of Pain Describe the Pain: Aching, Burning Pain Management and Medication Current Pain Management: Medication: Yes Is the Current Pain Management Adequate: Adequate How does your wound impact your activities of daily livingo Sleep: Yes Bathing: No Appetite: No Relationship With Others: No Bladder Continence: No Emotions: No Bowel Continence: No Work: No Toileting: No Drive: No Dressing: No Hobbies: No Electronic Signature(s) Signed: 01/14/2021 6:08:54 PM By: Baruch Gouty RN, BSN Entered By: Baruch Gouty on 01/14/2021 09:05:33 -------------------------------------------------------------------------------- Patient/Caregiver Education Details Patient Name: Date of Service: [name] 12/21/2022andnbsp8:45 A M Medical Record Number: [id number] Patient Account Number: [id number] Date of Birth/Gender: Treating RN: 10/17/1960 (60 y.o. [name] Primary Care Physician: Cipriano Mile Other Clinician: Referring  Physician: Treating Physician/Extender: Jolyne Loa Weeks in Treatment: 4 Education Assessment Education Provided To: Patient Education Topics Provided Smoking and Wound Healing: Methods: Explain/Verbal Responses: Reinforcements needed, State content correctly Wound/Skin Impairment: Methods: Explain/Verbal Responses: Reinforcements needed, State content correctly Electronic Signature(s) Signed: 01/14/2021 6:08:54 PM By: Baruch Gouty RN, BSN Entered By: Baruch Gouty on 01/14/2021 09:12:58 -------------------------------------------------------------------------------- Wound Assessment Details Patient Name: Date of Service: [name] 01/14/2021 8:45 A M Medical Record Number: [id number] Patient Account Number: [id number] Date of Birth/Sex: Treating RN: 10-01-1960 (60 y.o. 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 Date Acquired: 09/25/2020 foot Weeks Of Treatment: 4 Comorbid Hypertension, Peripheral Arterial Disease, Type II Diabetes, Clustered Wound: No History: Neuropathy Photos Wound Measurements Length: (cm) Width: (cm) Depth: (cm) Area: (cm) Volume: (cm) 0 % Reduction in Area: 100% 0 % Reduction in Volume: 100% 0 Epithelialization: Large (67-100%) 0 Tunneling: No 0 Undermining: No Wound Description Classification: Unable to visualize wound bed Wound Margin: Flat and Intact Exudate Amount: None Present Foul Odor After Cleansing: No Slough/Fibrino No Wound Bed Granulation Amount: None Present (0%) Exposed Structure Necrotic  Amount: None Present (0%) Fascia Exposed: No Fat Layer (Subcutaneous Tissue) Exposed: No Tendon Exposed: No Muscle Exposed: No Joint Exposed: No Bone Exposed: No Assessment Notes scabbed Electronic Signature(s) Signed: 01/14/2021 6:08:54 PM By: Baruch Gouty RN, BSN Signed: 01/14/2021 6:10:28 PM By: Deon Pilling RN, BSN Entered By: Deon Pilling on 01/14/2021 09:40:26 -------------------------------------------------------------------------------- [place] Details Patient Name: Date of Service: [name], [name] 01/14/2021 8:45 A M Medical Record Number: [id number] Patient Account Number: [id number] Date of Birth/Sex: Treating RN: 23-Mar-1960 (60 y.o. [name] Primary Care Chela Sutphen: Cipriano Mile Other Clinician: Referring Paisleigh Maroney: Treating Rickiya Picariello/Extender: Jolyne Loa Weeks in Treatment: 4 Vital Signs Time Taken: 09:03 Temperature (F): 98.5 Height (in): 62 Pulse (bpm): 91 Weight (lbs): 120 Respiratory Rate (breaths/min): 18 Body Mass Index (BMI): 21.9 Blood Pressure (mmHg): 129/83 Reference Range: 80 - 120 mg / dl Electronic Signature(s) Signed: 01/14/2021 6:08:54 PM By: Baruch Gouty RN, BSN Entered By: Baruch Gouty  on 01/14/2021 09:04:32

## 2021-01-14 NOTE — Progress Notes (Addendum)
LORIK, GUO ([id number]) Visit Report for 01/14/2021 Chief Complaint Document Details Patient Name: Date of Service: [name] 01/14/2021 8:45 A M Medical Record Number: [id number] Patient Account Number: [id number] Date of Birth/Sex: Treating RN: [birth year] (60 y.o. [name] Primary Care Provider: Cipriano Mile Other Clinician: Referring Provider: Treating Provider/Extender: Jolyne Loa Weeks in Treatment: 4 Information Obtained from: Patient Chief Complaint Left amputation site ulcer Electronic Signature(s) Signed: 01/14/2021 9:21:47 AM By: Worthy Keeler PA-C Entered By: Worthy Keeler on 01/14/2021 09:21:47 -------------------------------------------------------------------------------- HPI Details Patient Name: Date of Service: [name], Ruben Kalata J. 01/14/2021 8:45 A M Medical Record Number: [id number] Patient Account Number: [id number] Date of Birth/Sex: Treating RN: [birth year] (60 y.o. [name] Primary Care Provider: Cipriano Mile Other Clinician: Referring Provider: Treating Provider/Extender: Jolyne Loa Weeks in Treatment: 4 History of Present Illness HPI Description: 08/15/2019 upon evaluation today patient presents for evaluation here in our clinic concerning issues that he is having with wounds on the dorsal surface of his right foot. This is something that was noted to occur after he was obtaining cortisone injections with [facility]. Subsequently he developed ulcerations he was then referred to Dr. Alvester Chou who performed arterial testing and subsequently found that the patient had poor arterial flow with an ABI of 0.6 and a TBI of 0.26. With that being said he has since on 07/16/2019 had an angiogram on the right with improvement of his ABI to 0.85 and TBI to 0.5. Obviously this is not perfect but is definitely far superior to where it was previous. He does have some necrotic tissue on the  surface of the wounds is going to need debriding away he has been using Silvadene on this but to be honest that is not can to do anything for these wounds. He needs something more to clear this away sharp debridement is ideal and we may even look towards Santyl as well. The patient does have a history of diabetes noted in his chart though is not on medication and his A1c was around 6.4 so is not terrible. With that being said he does have known peripheral vascular disease he is actually have an angiogram on the left tomorrow. He also has hypertension and he does smoke. 8/27; this is a patient has been here once before about 6 weeks ago. Since then he was hospitalized from 7/22 through 7/23 by Dr. Gwenlyn Found for an attempt at revascularization. He had previously been seen by Dr. Amalia Hailey of podiatry. He is a continued tobacco smoker. His angiogram on 6/21 via revealed a occluded SFAs bilaterally with one-vessel runoff. He had a atherectomy followed by a drug-coated balloon angioplasty of the right SFA. 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 He had some improvement in his right ABI from 0.6-0.85. There is also an attempt to open his left SFA but that was unsuccessful. He does not have a wound on the left side. We are using Santyl on the patient's wounds on the right dorsal foot. He says he has about a 1 minute exercise tolerance before stopping because of pain compatible with claudication 9/10; we are using Santyl on the wound on the right dorsal foot. Still has significant claudication symptoms. He is status post angioplasty of the right SFA and atherectomy. He has one-vessel runoff via a diseased peroneal. He follows up with Dr. Gwenlyn Found on 9/14 9/17; I had my notes from last  week that the patient was to see Dr. Gwenlyn Found on 9/14 he tells me that the appointment is actually on 9/21 although I do not see that in epic. The next appointment I see with Dr. Gwenlyn Found is on 11/17. Nevertheless  the patient is fairly adamant that his appointment is early next week I told him to call ahead before he goes. The wound is a lot better we have been using Santyl over that he just ran out of that all represcribe it. 10/1; patient arrives today with nothing on his wound. He says he has been using the Santyl but he feels the border foam is too painful. We told him he needs to put a covering on this of some sort perhaps gauze. Indeed after our discussion last time his appointment with Dr. Gwenlyn Found 11/17. Previously he has had a atherectomy and an angioplasty of the right SFA. I note in my previous notes it was said he had bilateral one-vessel runoff I'll need to review the tibial vessels on the angiogram. The patient is currently rating his pain at 8 out of 10 He followed up with Dr. Amalia Hailey at [facility] on 9/27. He emphasized to follow-up with vascular and [place]. Follow-up with them as needed. 10/15; patient ran out of Santyl and has been using Silvadene. He still has constant pain which I think is largely claudication. I have looked over his angiogram results. I need to communicate with Dr. Gwenlyn Found to see if anything else can be done here. He arrives in clinic today with tendon over most of the wound surface area 10/29; we are using silver collagen on the right foot. Still complaining of a lot of pain especially at night. Dr. Gwenlyn Found is out of town this week I will send him an email but he has a follow-up appointment on 11/17. 11/18; patient saw Dr. Gwenlyn Found yesterday. He has a history of a directional atherectomy followed by a drug-coated balloon angioplasty on the right SFA. 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 His right ABI increased from 0.6 2.85 however the right foot wound has not budged. He is going next Wednesday for an attempt to do tibial artery interventions. He mentions that he has one-vessel runoff via the peroneal.. They are going to  attempt to look at the anterior tibial and posterior tibial revascularization next Wednesday. If they were successful at this then I will consider debridement and changing the topical dressings to this wound. Patient is still in a lot of pain 12/2; the patient underwent revascularization by Dr. Fletcher Anon on 12/19/2019. 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 The posterior tibial artery gives good flow into the whole pedal arch getting collaterals from the peroneal artery the anterior tibial artery was occluded. Unfortunately his right anterior tibial artery is not amenable for revascularization. He had a drug-eluting stent placed in the right common iliac artery into the external iliac artery. The patient arrives saying his pain is about the same. He still has exposed tendon in the base of the wound however there is some healthy looking granulation at the circumference. Perhaps reason for some guarded optimism. Comes in today not wanting debridement asking for pain medication 02/22/2020; patient has not been here in almost 2 months. He has a  punched-out area on the right dorsal foot. Completely necrotic surface. We have been using silver collagen when he was last here the beginning of December he ran out of this a long time ago. I think he has been using Silvadene cream. He follows up with Dr. Fletcher Anon or Dr. Gwenlyn Found next week. He arrives in clinic as usual not wanting debridement unfortunately its debridement is what he needs 2/11; this area on the right foot looks somewhat better than when I saw this 2 weeks ago. I did receive a call from Dr. Gwenlyn Found who went over his PAD history. He had a directional atherectomy followed by drug coated balloon angioplasty of his right SFA on 07/16/2019 his right ABI increased from  0.6-0.85 at that point. He underwent a right external iliac artery stenting by Dr. Fletcher Anon on 12/19/2019 his right SFA was patent as was his peroneal. He did not have a patent posterior tibial at the level of the ankle the only patent vessel to his foot is the anterior tibial artery although that is fortunate in that that is where the or the major wound is. He was not felt to be a candidate for any further angiographic intervention 03/28/2020 upon evaluation today patient appears to be doing well with regard to his wound. Is been tolerating the dressing changes without complication. With that being said he has been using Santyl this definitely seems to be doing better than nothing is for breakdown some of the slough and biofilm on the surface of the wound. Fortunately there is no evidence of active infection which is great news. No fevers, chills, nausea, vomiting, or diarrhea. 3/25; since the patient was last here I was called by Dr. Gwenlyn Found. They do not anticipate any more interventions at this point. I have listed his impressions on the right leg vasculature below " [name] returns today for follow-up. He follows with Dr. Dellia Nims at the wound care clinic for a slowly healing wound on the dorsal aspect of his right foot. He has had 2 interventions on this, one in July 2021 where I revascularized an occluded right SFA. He had one-vessel runoff. He did have spasm of his peroneal which ultimately was shown to have resolved repeat CT angiography. In November he had stenting of a calcified physiologically significant right external iliac artery. His anterior tibial artery does not reach past the ankle and therefore revascularization would be difficult. I would continue with aggressive local wound care. He also has claudication on the left. I attempted left SFA intervention in July but was unsuccessful in reentering the distal lumen. Unfortunately, he only has 1 patent tibial vessel on the left common  anterior tibial, and no evidence of critical limb ischemia so I am hesitant to recommend tibial pedal access for a retrograde approach." He has been using Santyl on the right dorsal foot. Arrives with the wound above 0.5 cm in depth the surface does not look too bad. He is also complaining about pain in the left fifth toe without any antecedent trauma he is aware of. Says that the pain has been there for the last 5 or 6 weeks 4/15; the area on his right dorsal foot perhaps slightly better. We have been using Santyl. Dr. Kennon Holter last opinions on his vascular supply are listed in my note of 04/18/2020. Again he complains of pain in the left fifth toe. We ordered an x-ray of this almost 3 weeks ago. He did not get it done 4/29; patient has been using Santyl to  the right dorsal foot wound. He reports improvement to the wound. He denies signs and symptoms of infection. He was able to obtain the left foot x-ray that was ordered because he was complaining about toe pain at last clinic visit. 5/26; patient was last seen a month ago. He continue to use Santyl over the last month to his right dorsal foot wound. He reports that it is closed. Readmission: 12/17/2020 upon evaluation today patient appears for reevaluation here in the clinic its been since last May since we have seen him. 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 Unfortunately despite all this his blood flow is still very poor. In fact his follow-up ABIs after his intervention with the aortobifemoral bypass graft on 10/09/2020 were still not good. His right ABI was 0.81 with a TBI absent. His left ABI was 0.37 with a TBI absent. 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 Either way it appears his  arterial flow is still very problematic. I discussed that with him today as well. Subsequently more than wound care I think he really needs vascular follow-up he tells me he does see them on the 30th of this month. 01/14/2021 upon evaluation today patient appears to be doing well with regard to his wound. He does not have anything that is obviously open at this point which is great news. Fortunately there does not appear to be any signs of active infection at this time. The patient never did go for his vascular evaluation he does see Dr. Posey Pronto on Friday. He needs to go for vascular evaluation ASAP in my opinion I think there is still some blood flow compromise here. Electronic Signature(s) Signed: 01/14/2021 10:00:00 AM By: Worthy Keeler PA-C Entered By: Worthy Keeler on 01/14/2021 09:59:59 -------------------------------------------------------------------------------- Physical Exam Details Patient Name: Date of Service: [name]. 01/14/2021 8:45 A M Medical Record Number: [id number] Patient Account Number: [id number] Date of Birth/Sex: Treating RN: 27-Mar-1960 (60 y.o. [name] Primary Care Provider: Cipriano Mile Other Clinician: Referring Provider: Treating Provider/Extender: Jolyne Loa Weeks in Treatment: 4 Constitutional Well-nourished and well-hydrated in no acute distress. Respiratory normal breathing without difficulty. Psychiatric this patient is able to make decisions and demonstrates good insight into disease process. Alert and Oriented x 3. pleasant and cooperative. Notes Upon evaluation what I see currently I do not see any signs of active infection at this time. In fact I do not see any open wound which is great news as well still have not 673% certain that this is completely closed but I cannot be sure that it is not either based on what I see currently. For that reason I Georgina Peer go ahead and recommend that we have the patient  continue to follow with podiatry. Electronic Signature(s) Signed: 01/14/2021 10:01:00 AM By: Worthy Keeler PA-C Entered By: Worthy Keeler on 01/14/2021 10:01:00 -------------------------------------------------------------------------------- Physician Orders Details Patient Name: Date of Service: [name], Ruben Axon. 01/14/2021 8:45 A M Medical Record Number: [id number] Patient Account Number: [id number] Date of Birth/Sex: Treating RN: 1960/06/22 (60 y.o. 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 Discharge from [place] - Follow up with Dr. Posey Pronto and [facility] related to blood flow. [facility] # [phone] pad area for protection. Electronic Signature(s) Signed: 01/14/2021 5:18:31 PM By: Worthy Keeler PA-C Signed: 01/14/2021 6:10:28 PM By: Deon Pilling RN, BSN Entered By: Deon Pilling on 01/14/2021 09:41:34 -------------------------------------------------------------------------------- Problem List Details Patient Name: Date of Service: [name], Ruben Kalata J. 01/14/2021 8:45 A M Medical Record Number: [id number] Patient Account Number: [id number] Date of Birth/Sex: Treating RN: [birth year] (60 y.o. 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 01/14/2021 8:45 A M Medical Record Number: [id number] Patient Account Number: [id number] Date of Birth/Sex: Treating RN: April 07, 1960 (60 y.o. [name] Primary Care Provider: Cipriano Mile Other Clinician: Referring Provider: Treating Provider/Extender: Jolyne Loa Weeks in Treatment: 4 Subjective Chief Complaint Information obtained from Patient Left amputation site ulcer History of Present Illness (HPI) 08/15/2019 upon evaluation today patient presents for evaluation here in our clinic concerning issues that he is having with wounds on the dorsal surface of his right foot. This is something that was noted to occur after he was obtaining cortisone injections with [facility]. Subsequently he developed ulcerations he was then referred to Dr. Alvester Chou who performed arterial testing and subsequently found that the patient had poor arterial flow with an ABI of 0.6 and a TBI of 0.26. With that being said he has since on 07/16/2019 had an angiogram on the right with improvement of his ABI to 0.85 and TBI to 0.5. Obviously this is not perfect but is definitely far superior  to where it was previous. He does have some necrotic tissue on the surface of the wounds is going to need debriding away he has been using Silvadene on this but to be honest that is not can to do anything for these wounds. He needs something more to clear this away sharp debridement is ideal and we may even look towards Santyl as well. The patient does have a history of diabetes noted in his chart though is not on medication and his A1c was around 6.4 so is not terrible. With that being said he does have known peripheral vascular disease he is actually have an angiogram on the left tomorrow. He also has hypertension and he does smoke. 8/27; this is a patient has been here once before about 6 weeks ago. Since then he was hospitalized from 7/22 through 7/23 by Dr. Gwenlyn Found for an attempt at revascularization.  He had previously been seen by Dr. Amalia Hailey of podiatry. He is a continued tobacco smoker. His angiogram on 6/21 via revealed a occluded SFAs bilaterally with one-vessel runoff. He had a atherectomy followed by a drug-coated balloon angioplasty of the right SFA. 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 He had some improvement in his right ABI from 0.6-0.85. There is also an attempt to open his left SFA but that was unsuccessful. He does not have a wound on the left side. We are using Santyl on the patient's wounds on the right dorsal foot. He says he has about a 1 minute exercise tolerance before stopping because of pain compatible with claudication 9/10; we are using Santyl on the wound on the right dorsal foot. Still has significant claudication symptoms. He is status post angioplasty of the right SFA and atherectomy. He has one-vessel runoff via a diseased peroneal. He follows up with Dr. Gwenlyn Found on 9/14 9/17; I had my notes from last week that the patient was to see Dr. Gwenlyn Found on 9/14 he tells me that the appointment is actually on 9/21 although I do not see that in epic.  The next appointment I see with Dr. Gwenlyn Found is on 11/17. Nevertheless the patient is fairly adamant that his appointment is early next week I told him to call ahead before he goes. The wound is a lot better we have been using Santyl over that he just ran out of that all represcribe it. 10/1; patient arrives today with nothing on his wound. He says he has been using the Santyl but he feels the border foam is too painful. We told him he needs to put a covering on this of some sort perhaps gauze. Indeed after our discussion last time his appointment with Dr. Gwenlyn Found 11/17. Previously he has had a atherectomy and an angioplasty of the right SFA. I note in my previous notes it was said he had bilateral one-vessel runoff I'll need to review the tibial vessels on the angiogram. The patient is currently rating his pain at 8 out of 10 He followed up with Dr. Amalia Hailey at [facility] on 9/27. He emphasized to follow-up with vascular and [place]. Follow-up with them as needed. 10/15; patient ran out of Santyl and has been using Silvadene. He still has constant pain which I think is largely claudication. I have looked over his angiogram results. I need to communicate with Dr. Gwenlyn Found to see if anything else can be done here. He arrives in clinic today with tendon over most of the wound surface area 10/29; we are using silver collagen on the right foot. Still complaining of a lot of pain especially at night. Dr. Gwenlyn Found is out of town this week I will send him an email but he has a follow-up appointment on 11/17. 11/18; patient saw Dr. Gwenlyn Found yesterday. He has a history of a directional atherectomy followed by a drug-coated balloon angioplasty on the right SFA. 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 His right ABI increased from 0.6 2.85 however the right foot wound has not budged. He is going next Wednesday for an attempt to do tibial artery interventions. He mentions that  he has one-vessel runoff via the peroneal.. They are going to attempt to look at the anterior tibial and posterior tibial revascularization next Wednesday. If they were successful at this then I will consider debridement and changing  the topical dressings to this wound. Patient is still in a lot of pain 12/2; the patient underwent revascularization by Dr. Fletcher Anon on 12/19/2019. 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 The posterior tibial artery gives good flow into the whole pedal arch getting collaterals from the peroneal artery the anterior tibial artery was occluded. Unfortunately his right anterior tibial artery is not amenable for revascularization. He had a drug-eluting stent placed in the right common iliac artery into the external iliac artery. The patient arrives saying his pain is about the same. He still has exposed tendon in the base of the wound however there is some healthy looking granulation at the circumference. Perhaps reason for some guarded optimism. Comes in today not wanting debridement asking for pain medication 02/22/2020; patient has not been here in almost 2 months. He has a punched-out area on the right dorsal foot. Completely necrotic surface. We have been using silver collagen when he was last here the beginning of December he ran out of this a long time ago. I think he has been using Silvadene cream. He follows up with Dr. Fletcher Anon or Dr. Gwenlyn Found next week. He arrives in clinic as usual not wanting debridement unfortunately its debridement is what he needs 2/11; this area on the right foot looks somewhat better than when I saw this 2 weeks ago. I did receive a call from Dr. Gwenlyn Found who went over his PAD history. He had a directional atherectomy followed by drug coated balloon angioplasty  of his right SFA on 07/16/2019 his right ABI increased from 0.6-0.85 at that point. He underwent a right external iliac artery stenting by Dr. Fletcher Anon on 12/19/2019 his right SFA was patent as was his peroneal. He did not have a patent posterior tibial at the level of the ankle the only patent vessel to his foot is the anterior tibial artery although that is fortunate in that that is where the or the major wound is. He was not felt to be a candidate for any further angiographic intervention 03/28/2020 upon evaluation today patient appears to be doing well with regard to his wound. Is been tolerating the dressing changes without complication. With that being said he has been using Santyl this definitely seems to be doing better than nothing is for breakdown some of the slough and biofilm on the surface of the wound. Fortunately there is no evidence of active infection which is great news. No fevers, chills, nausea, vomiting, or diarrhea. 3/25; since the patient was last here I was called by Dr. Gwenlyn Found. They do not anticipate any more interventions at this point. I have listed his impressions on the right leg vasculature below " [name] returns today for follow-up. He follows with Dr. Dellia Nims at the wound care clinic for a slowly healing wound on the dorsal aspect of his right foot. He has had 2 interventions on this, one in July 2021 where I revascularized an occluded right SFA. He had one-vessel runoff. He did have spasm of his peroneal which ultimately was shown to have resolved repeat CT angiography. In November he had stenting of a calcified physiologically significant right external iliac artery. His anterior tibial artery does not reach past the ankle and therefore revascularization would be difficult. I would continue with aggressive local wound care. He also has claudication on the  left. I attempted left SFA intervention in July but was unsuccessful in reentering the distal lumen. Unfortunately, he  only has 1 patent tibial vessel on the left common anterior tibial, and no evidence of critical limb ischemia so I am hesitant to recommend tibial pedal access for a retrograde approach." He has been using Santyl on the right dorsal foot. Arrives with the wound above 0.5 cm in depth the surface does not look too bad. He is also complaining about pain in the left fifth toe without any antecedent trauma he is aware of. Says that the pain has been there for the last 5 or 6 weeks 4/15; the area on his right dorsal foot perhaps slightly better. We have been using Santyl. Dr. Kennon Holter last opinions on his vascular supply are listed in my note of 04/18/2020. Again he complains of pain in the left fifth toe. We ordered an x-ray of this almost 3 weeks ago. He did not get it done 4/29; patient has been using Santyl to the right dorsal foot wound. He reports improvement to the wound. He denies signs and symptoms of infection. He was able to obtain the left foot x-ray that was ordered because he was complaining about toe pain at last clinic visit. 5/26; patient was last seen a month ago. He continue to use Santyl over the last month to his right dorsal foot wound. He reports that it is closed. Readmission: 12/17/2020 upon evaluation today patient appears for reevaluation here in the clinic its been since last May since we have seen him. 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 Unfortunately despite all this his blood flow is still very poor. In fact his follow-up ABIs after his intervention with the aortobifemoral bypass graft on 10/09/2020 were still not good. His right ABI was 0.81 with a TBI absent. His left ABI was 0.37 with a TBI absent. 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 Either way it appears his arterial flow is still very problematic. I discussed that with him today as well. Subsequently more than wound care I think he really needs vascular follow-up he tells me he does see them on the 30th of this month. 01/14/2021 upon evaluation today patient appears to be doing well with regard to his wound. He does not have anything that is obviously open at this point which is great news. Fortunately there does not appear to be any signs of active infection at this time. The patient never did go for his vascular evaluation he does see Dr. Posey Pronto on Friday. He needs to go for vascular evaluation ASAP in my opinion I think there is still some blood flow compromise here. Objective Constitutional Well-nourished and well-hydrated in no acute distress. Vitals Time Taken: 9:03 AM, Height: 62 in, Weight: 120 lbs, BMI: 21.9, Temperature: 98.5 F, Pulse: 91 bpm, Respiratory Rate: 18 breaths/min, Blood Pressure: 129/83 mmHg. Respiratory normal breathing without difficulty. Psychiatric this patient is able to make decisions and demonstrates good insight into disease process. Alert and Oriented x 3. pleasant and cooperative. General Notes: Upon evaluation what I see currently I do not see any signs of active infection at this time. In fact I do not see any open wound which is great news as  well still have not 425% certain that this is completely closed but I cannot be sure that it is not either based on what I see currently. For that reason I Georgina Peer go ahead and recommend that we have the patient continue to follow with podiatry. Integumentary (Hair, Skin) Wound #3 status is Healed - Epithelialized. Original cause of wound was Not Known. The date acquired was: 09/25/2020. The wound has been in treatment 4 weeks. The wound is located on the Left Amputation Site - T The wound measures 0cm length x 0cm width x 0cm depth; 0cm^2 area and 0cm^3 volume. oe. There is  no tunneling or undermining noted. There is a none present amount of drainage noted. The wound margin is flat and intact. There is no granulation within the wound bed. There is no necrotic tissue within the wound bed. 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 Posey Pronto and Vein and V[facility] related to blood flow. Vein andVascular # [phone] pad area for protection. 1. Based on what I am seeing currently I Georgina Peer suggest that we going continue with the wound care measures as before with regard to protecting the foot area although he does not need any specialized dressings due to the fact this appears to be closed. 2. I am also going to recommend as well he does need to go to the vascular appointment. Obviously I think that vascular evaluation is probably his utmost concern at this point considering the previous testing what it showed. Follow-up as needed Electronic Signature(s) Signed: 01/14/2021 10:01:39 AM By: Worthy Keeler PA-C Entered By: Worthy Keeler on 01/14/2021 10:01:39 -------------------------------------------------------------------------------- SuperBill Details Patient Name: Date of Service: [name], Ruben Kalata J. 01/14/2021 Medical Record Number: [id number] Patient Account Number: [id number] Date of Birth/Sex: Treating RN: [birth year] (60 y.o. [name] Primary Care Provider: Cipriano Mile Other Clinician: Referring Provider: Treating Provider/Extender: Jolyne Loa Weeks in Treatment: 4 Diagnosis Coding ICD-10 Codes Code Description I73.89 Other specified peripheral  vascular diseases E11.621 Type 2 diabetes mellitus with foot ulcer Z89.422 Acquired absence of other left toe(s) L97.528 Non-pressure chronic ulcer of other part of left foot with other specified severity F17.218 Nicotine dependence, cigarettes, with other nicotine-induced disorders Facility Procedures CPT4 Code: 66063016 Description: 99213 - WOUND CARE VISIT-LEV 3 EST PT Modifier: Quantity: 1 Physician Procedures : CPT4 Code Description Modifier [id number] 99213 - WC PHYS LEVEL 3 - EST PT ICD-10 Diagnosis Description I73.89 Other specified peripheral vascular diseases E11.621 Type 2 diabetes mellitus with foot ulcer Z89.422 Acquired absence of other left toe(s)  L97.528 Non-pressure chronic ulcer of other part of left foot with other specified severity Quantity: 1 Electronic Signature(s) Signed: 01/14/2021 10:03:07 AM By: Worthy Keeler PA-C Entered By: Worthy Keeler on 01/14/2021 10:03:06

## 2021-01-16 ENCOUNTER — Ambulatory Visit: Payer: Medicare Other | Admitting: Podiatry

## 2021-01-30 ENCOUNTER — Ambulatory Visit (INDEPENDENT_AMBULATORY_CARE_PROVIDER_SITE_OTHER): Payer: Medicare Other | Admitting: Podiatry

## 2021-01-30 ENCOUNTER — Other Ambulatory Visit: Payer: Self-pay

## 2021-01-30 DIAGNOSIS — I739 Peripheral vascular disease, unspecified: Secondary | ICD-10-CM | POA: Diagnosis not present

## 2021-01-30 DIAGNOSIS — E119 Type 2 diabetes mellitus without complications: Secondary | ICD-10-CM

## 2021-02-04 NOTE — Progress Notes (Signed)
Subjective:  Patient ID: Ruben Beams., male    DOB: August 27, 1960,  MRN: [id number]  Chief Complaint  Patient presents with   Foot Pain    Foot pain     61 y.o. male presents with the above complaint.  Patient presents with complaint of claudication/rest pain symptoms especially to the left leg.  Patient has severe vascular history with peripheral vascular disease.  She is he is here to get his leg evaluated.  He has an appointment scheduled with vascular doctor on 02/17/2021.  I encouraged him to keep that follow-up.  He states he is having a lot of pain.  He denies any other acute complaints.  He is a diabetic.   Review of Systems: Negative except as noted in the HPI. Denies N/V/F/Ch.  Past Medical History:  Diagnosis Date   Diabetes mellitus without complication (HCC)    Hyperlipidemia    Hypertension    Peripheral vascular disease ([place])    Tobacco use     Current Outpatient Medications:    acetaminophen (TYLENOL) 325 MG tablet, Take 2 tablets (650 mg total) by mouth every 6 (six) hours as needed., Disp: , Rfl:    aspirin EC 81 MG EC tablet, Take 1 tablet (81 mg total) by mouth daily. Swallow whole. (Patient taking differently: Take 81 mg by mouth daily at 2 PM. Swallow whole.), Disp: 90 tablet, Rfl: 3   atorvastatin (LIPITOR) 40 MG tablet, Take 40 mg by mouth daily., Disp: , Rfl:    Blood Pressure Monitor KIT, Check blood pressure daily (Patient taking differently: 1 each by Other route once a week.), Disp: 1 kit, Rfl: 0   collagenase (SANTYL) ointment, Apply topically daily., Disp: 15 g, Rfl: 0   gabapentin (NEURONTIN) 600 MG tablet, Take 1 tablet (600 mg) in morning and afternoon. Take 2 tablets (1200 mg) at night (Patient taking differently: Take 600-1,200 mg by mouth See admin instructions. 1200 mg in the morning and at bedtime 600 mg  in the afternoon), Disp: 120 tablet, Rfl: 0   lisinopril-hydrochlorothiazide (ZESTORETIC) 20-25 MG tablet, Take 1 tablet by mouth daily.,  Disp: , Rfl:    Multiple Vitamin (MULTIVITAMIN WITH MINERALS) TABS tablet, Take 1 tablet by mouth daily., Disp: , Rfl:    ondansetron (ZOFRAN ODT) 4 MG disintegrating tablet, 4mg  ODT q4 hours prn nausea/vomit, Disp: 30 tablet, Rfl: 0   oxyCODONE-acetaminophen (PERCOCET) 5-325 MG tablet, Take 1 tablet by mouth every 4 (four) hours as needed for severe pain., Disp: 30 tablet, Rfl: 0   pantoprazole (PROTONIX) 40 MG tablet, Take 1 tablet (40 mg total) by mouth daily., Disp: 30 tablet, Rfl: 0   traMADol (ULTRAM) 50 MG tablet, Take 1 tablet (50 mg total) by mouth every 6 (six) hours as needed for moderate pain., Disp: 20 tablet, Rfl: 0  Current Facility-Administered Medications:    sodium chloride flush (NS) 0.9 % injection 3 mL, 3 mL, Intravenous, Q12H, Lorretta Harp, MD  Social History   Tobacco Use  Smoking Status Every Day   Packs/day: 0.50   Years: 40.00   Pack years: 20.00   Types: Cigarettes  Smokeless Tobacco Never    No Known Allergies Objective:  There were no vitals filed for this visit. There is no height or weight on file to calculate BMI. Constitutional Well developed. Well nourished.  Vascular Dorsalis pedis pulses non palpable bilaterally. Posterior tibial pulses jnon palpable bilaterally. Capillary refill diminished No cyanosis or clubbing noted. Pedal pedal however not present  Neurologic  Normal speech. Oriented to person, place, and time. Epicritic sensation to light touch grossly present bilaterally.  Dermatologic Nails well groomed and normal in appearance. No open wounds. No skin lesions.  Orthopedic: No soft tissue deficit no open ulceration noted.  Patient is experiencing claudication type of symptoms.  Dependent rubor noted.  No cellulitis noted.  No open wounds or lesions noted.   Radiographs: None Assessment:   1. PVD (peripheral vascular disease) ([place])   2. Type 2 diabetes mellitus without complication, without long-term current use of insulin  (HCC)   3. Claudication ([place])    Plan:  Patient was evaluated and treated and all questions answered.  Left leg claudication/rest pain with underlying dependent rubor -All questions and concerns were discussed with the patient in extensive detail -I discussed with the patient that this is very vascular in nature given that he already has an appointment on January 24 I encouraged him to follow-up with his vascular doctor to see if there is anything that could be done for the pain. -At this time patient does not have any soft tissue loss or any foot and ankle deformities.  I discussed with him to come see me if any foot and ankle issues arise if.  He can continue to see Dr. Sharyon Cable for routine foot care.  No follow-ups on file.

## 2021-02-17 ENCOUNTER — Inpatient Hospital Stay (HOSPITAL_COMMUNITY): Admission: RE | Admit: 2021-02-17 | Payer: 59 | Source: Ambulatory Visit

## 2021-02-17 ENCOUNTER — Ambulatory Visit: Payer: Medicare Other

## 2021-02-17 ENCOUNTER — Encounter (HOSPITAL_COMMUNITY): Payer: 59

## 2021-02-26 ENCOUNTER — Ambulatory Visit: Payer: Medicaid Other

## 2021-02-26 ENCOUNTER — Encounter (HOSPITAL_COMMUNITY): Payer: Medicaid Other

## 2021-02-26 ENCOUNTER — Encounter (HOSPITAL_COMMUNITY): Payer: Self-pay

## 2021-03-13 ENCOUNTER — Ambulatory Visit: Payer: Medicare Other | Admitting: Podiatry

## 2021-03-24 ENCOUNTER — Encounter (HOSPITAL_COMMUNITY): Payer: Self-pay

## 2021-03-24 ENCOUNTER — Encounter (HOSPITAL_COMMUNITY): Payer: Medicaid Other

## 2021-03-24 ENCOUNTER — Ambulatory Visit: Payer: Medicaid Other

## 2021-04-30 ENCOUNTER — Other Ambulatory Visit (HOSPITAL_COMMUNITY): Payer: Self-pay

## 2021-05-18 NOTE — Progress Notes (Deleted)
[facility] [place]  ASSESSMENT / PLAN: 61 y.o. male status post 1) aorto-bi-femoral bypass (14x69m [place]); 2) right femoral endarterectomy; 3) left femoral endarterectomy on 10/09/20 for ischemic rest pain.   CHIEF COMPLAINT: ***  HISTORY OF PRESENT ILLNESS: [name] is a 61y.o. male ***  VASCULAR SURGICAL HISTORY: ***  VASCULAR RISK FACTORS: {FINDINGS; POSITIVE NEGATIVE:801 237 8198} history of stroke / transient ischemic attack. {FINDINGS; POSITIVE NEGATIVE:801 237 8198} history of coronary artery disease. *** history of PCI. *** history of CABG.  {FINDINGS; POSITIVE NEGATIVE:801 237 8198} history of diabetes mellitus. Last A1c ***. {FINDINGS; POSITIVE NEGATIVE:801 237 8198} history of smoking. *** actively smoking. {FINDINGS; POSITIVE NEGATIVE:801 237 8198} history of hypertension. *** drug regimen with *** control. {FINDINGS; POSITIVE NEGATIVE:801 237 8198} history of chronic kidney disease.  Last GFR ***. CKD {stage:30421363}. {FINDINGS; POSITIVE NEGATIVE:801 237 8198} history of chronic obstructive pulmonary disease, treated with ***.  FUNCTIONAL STATUS: ECOG performance status: {findings; ecog performance status:31780} Ambulatory status: {TNHAmbulation:25868}  Past Medical History:  Diagnosis Date   Diabetes mellitus without complication ([place]    Hyperlipidemia    Hypertension    Peripheral vascular disease ([facility]    Tobacco use     Past Surgical History:  Procedure Laterality Date   ABDOMINAL AORTOGRAM W/LOWER EXTREMITY Left 07/16/2019   Procedure: ABDOMINAL AORTOGRAM W/LOWER EXTREMITY;  Surgeon: BLorretta Harp MD;  [facility];  Service: Cardiovascular;  Laterality: Left;   ABDOMINAL AORTOGRAM W/LOWER EXTREMITY Left 08/16/2019   ABDOMINAL AORTOGRAM W/LOWER EXTREMITY N/A 08/16/2019   Procedure: ABDOMINAL AORTOGRAM W/LOWER EXTREMITY;  Surgeon: BLorretta Harp MD;  [facility];  Service: Cardiovascular;   Laterality: N/A;   ABDOMINAL AORTOGRAM W/LOWER EXTREMITY Bilateral 12/19/2019   Procedure: ABDOMINAL AORTOGRAM W/LOWER EXTREMITY;  Surgeon: AWellington Hampshire MD;  [facility];  Service: Cardiovascular;  Laterality: Bilateral;   ABDOMINAL AORTOGRAM W/LOWER EXTREMITY Left 06/10/2020   Procedure: ABDOMINAL AORTOGRAM W/LOWER EXTREMITY;  Surgeon: BSerafina Mitchell MD;  [facility];  Service: Cardiovascular;  Laterality: Left;   ABDOMINAL AORTOGRAM W/LOWER EXTREMITY N/A 10/06/2020   Procedure: ABDOMINAL AORTOGRAM W/LOWER EXTREMITY;  Surgeon: CWaynetta Sandy MD;  [facility];  Service: Cardiovascular;  Laterality: N/A;   AMPUTATION TOE Left 06/13/2020   Procedure: Left Fifth Toe Amputation ;  Surgeon: MCriselda Peaches DPM;  [facility]  Service: Podiatry;  Laterality: Left;   AORTA - BILATERAL FEMORAL ARTERY BYPASS GRAFT Bilateral 10/09/2020   Procedure: AORTA BIFEMORAL BYPASS GRAFT;  Surgeon: HCherre Robins MD;  [facility]  Service: Vascular;  Laterality: Bilateral;   arm surgery     ENDARTERECTOMY FEMORAL Bilateral 10/09/2020   Procedure: ENDARTERECTOMY COMMON FEMORAL;  Surgeon: HCherre Robins MD;  [facility]  Service: Vascular;  Laterality: Bilateral;   PERIPHERAL VASCULAR BALLOON ANGIOPLASTY  08/16/2019   Procedure: PERIPHERAL VASCULAR BALLOON ANGIOPLASTY;  Surgeon: BLorretta Harp MD;  [facility];  Service: Cardiovascular;;  attempted PTA of Left SFA   PERIPHERAL VASCULAR INTERVENTION Right 07/16/2019   Procedure: PERIPHERAL VASCULAR INTERVENTION;  Surgeon: BLorretta Harp MD;  [facility];  Service: Cardiovascular;  Laterality: Right;   PERIPHERAL VASCULAR INTERVENTION Right 12/19/2019   Procedure: PERIPHERAL VASCULAR INTERVENTION;  Surgeon: AWellington Hampshire MD;  [facility];  Service: Cardiovascular;  Laterality: Right;  iliac   PERIPHERAL VASCULAR  INTERVENTION Left 06/10/2020   Procedure: PERIPHERAL VASCULAR INTERVENTION;  Surgeon: BSerafina Mitchell MD;  [facility];  Service: Cardiovascular;  Laterality: Left;  SFA /COMMON ILIAC    Family History  Problem Relation Age of Onset   Cancer Mother    Diabetes Father     Social History   Socioeconomic History   Marital status: Legally Separated    Spouse name: Not on file   Number of children: Not on file   Years of education: Not on file   Highest education level: Not on file  Occupational History   Not on file  Tobacco Use   Smoking status: Every Day    Packs/day: 0.50    Years: 40.00    Pack years: 20.00    Types: Cigarettes   Smokeless tobacco: Never  Vaping Use   Vaping Use: Never used  Substance and Sexual Activity   Alcohol use: Yes    Comment: beer and liquor daily   Drug use: Yes    Frequency: 3.0 times per week    Types: Marijuana, Cocaine   Sexual activity: Not on file  Other Topics Concern   Not on file  Social History Narrative   Not on file   Social Determinants of Health   Financial Resource Strain: Not on file  Food Insecurity: Not on file  Transportation Needs: Not on file  Physical Activity: Not on file  Stress: Not on file  Social Connections: Not on file  Intimate Partner Violence: Not on file    No Known Allergies  Current Outpatient Medications  Medication Sig Dispense Refill   acetaminophen (TYLENOL) 325 MG tablet Take 2 tablets (650 mg total) by mouth every 6 (six) hours as needed.     aspirin EC 81 MG EC tablet Take 1 tablet (81 mg total) by mouth daily. Swallow whole. (Patient taking differently: Take 81 mg by mouth daily at 2 PM. Swallow whole.) 90 tablet 3   atorvastatin (LIPITOR) 40 MG tablet Take 40 mg by mouth daily.     Blood Pressure Monitor KIT Check blood pressure daily (Patient taking differently: 1 each by Other route once a week.) 1 kit 0   collagenase (SANTYL) ointment Apply topically daily. 15 g 0    gabapentin (NEURONTIN) 600 MG tablet Take 1 tablet (600 mg) in morning and afternoon. Take 2 tablets (1200 mg) at night (Patient taking differently: Take 600-1,200 mg by mouth See admin instructions. 1200 mg in the morning and at bedtime 600 mg  in the afternoon) 120 tablet 0   lisinopril-hydrochlorothiazide (ZESTORETIC) 20-25 MG tablet Take 1 tablet by mouth daily.     Multiple Vitamin (MULTIVITAMIN WITH MINERALS) TABS tablet Take 1 tablet by mouth daily.     ondansetron (ZOFRAN ODT) 4 MG disintegrating tablet 71m ODT q4 hours prn nausea/vomit 30 tablet 0   oxyCODONE-acetaminophen (PERCOCET) 5-325 MG tablet Take 1 tablet by mouth every 4 (four) hours as needed for severe pain. 30 tablet 0   pantoprazole (PROTONIX) 40 MG tablet Take 1 tablet (40 mg total) by mouth daily. 30 tablet 0   traMADol (ULTRAM) 50 MG tablet Take 1 tablet (50 mg total) by mouth every 6 (six) hours as needed for moderate pain. 20 tablet 0   Current Facility-Administered Medications  Medication Dose Route Frequency Provider Last Rate Last Admin   sodium chloride flush (NS) 0.9 % injection 3 mL  3 mL Intravenous Q12H BLorretta Harp MD        PHYSICAL EXAM There were no vitals filed for this visit.  Constitutional: *** appearing. *** distress. Appears ***  nourished.  Neurologic: CN ***. *** focal findings. *** sensory loss. Psychiatric: *** Mood and affect symmetric and appropriate. Eyes: *** No icterus. No conjunctival pallor. Ears, nose, throat: *** mucous membranes moist. Midline trachea.  Cardiac: *** rate and rhythm.  Respiratory: *** unlabored. Abdominal: *** soft, non-tender, non-distended.  Peripheral vascular: *** Extremity: *** edema. *** cyanosis. *** pallor.  Skin: *** gangrene. *** ulceration.  Lymphatic: *** Stemmer's sign. *** palpable lymphadenopathy.  PERTINENT LABORATORY AND RADIOLOGIC DATA  Most recent CBC    Latest Ref Rng & Units 11/10/2020    1:50 PM 10/29/2020    2:06 AM 10/28/2020     3:45 AM  CBC  WBC 4.0 - 10.5 K/uL 7.2   6.2   5.8    Hemoglobin 13.0 - 17.0 g/dL 10.9   8.3   9.7    Hematocrit 39.0 - 52.0 % 36.4   26.7   30.6    Platelets 150 - 400 K/uL 476   386   501       Most recent CMP    Latest Ref Rng & Units 11/10/2020    1:50 PM 10/29/2020    2:06 AM 10/28/2020    3:45 AM  CMP  Glucose 70 - 99 mg/dL 103   105   118    BUN 8 - 23 mg/dL 48   12   10    Creatinine 0.61 - 1.24 mg/dL 1.29   0.89   0.73    Sodium 135 - 145 mmol/L 134   137   141    Potassium 3.5 - 5.1 mmol/L 4.4   3.8   3.8    Chloride 98 - 111 mmol/L 96   102   101    CO2 22 - 32 mmol/L '24   29   29    ' Calcium 8.9 - 10.3 mg/dL 10.1   9.0   9.5    Total Protein 6.5 - 8.1 g/dL   7.0    Total Bilirubin 0.3 - 1.2 mg/dL   0.6    Alkaline Phos 38 - 126 U/L   71    AST 15 - 41 U/L   25    ALT 0 - 44 U/L   18      Renal function CrCl cannot be calculated (Patient's most recent lab result is older than the maximum 21 days allowed.).  Hgb A1c MFr Bld (%)  Date Value  10/06/2020 5.5    LDL Chol Calc (NIH)  Date Value Ref Range Status  01/16/2020 100 (H) 0 - 99 mg/dL Final   LDL Cholesterol  Date Value Ref Range Status  10/06/2020 117 (H) 0 - 99 mg/dL Final    Comment:           Total Cholesterol/HDL:CHD Risk Coronary Heart Disease Risk Table                     Men   Women  1/2 Average Risk   3.4   3.3  Average Risk       5.0   4.4  2 X Average Risk   9.6   7.1  3 X Average Risk  23.4   11.0        Use the calculated Patient Ratio above and the CHD Risk Table to determine the patient's CHD Risk.        ATP III CLASSIFICATION (LDL):  <100     mg/dL   Optimal  100-129  mg/dL   Near or Above                    Optimal  130-159  mg/dL   Borderline  160-189  mg/dL   High  >190     mg/dL   Very High Performed at [facility] [address] [place], [place] [address]      Vascular Imaging: ***  [name], MD [facility] Phone Number: [phone] 05/18/2021 8:22 PM  Total time spent on preparing this encounter including chart review, data review, collecting history, examining the patient, coordinating care for this {tnhtimebilling:26202}  Portions of this report may have been transcribed using voice recognition software.  Every effort has been made to ensure accuracy; however, inadvertent computerized transcription errors may still be present.

## 2021-05-19 ENCOUNTER — Ambulatory Visit: Payer: Medicaid Other | Admitting: Vascular Surgery

## 2021-05-19 ENCOUNTER — Encounter (HOSPITAL_COMMUNITY): Payer: Self-pay

## 2021-05-19 ENCOUNTER — Inpatient Hospital Stay (HOSPITAL_COMMUNITY): Admission: RE | Admit: 2021-05-19 | Payer: Medicaid Other | Source: Ambulatory Visit

## 2022-03-23 ENCOUNTER — Encounter: Payer: Self-pay | Admitting: Family

## 2022-03-23 ENCOUNTER — Other Ambulatory Visit: Payer: Self-pay | Admitting: Family

## 2022-03-23 DIAGNOSIS — I739 Peripheral vascular disease, unspecified: Secondary | ICD-10-CM

## 2022-03-23 DIAGNOSIS — F17210 Nicotine dependence, cigarettes, uncomplicated: Secondary | ICD-10-CM

## 2022-04-02 IMAGING — CR DG FOOT COMPLETE 3+V*L*
3 series · 3 of 3 positions shown · non-contrast
Comparison: None.

CLINICAL DATA: Left foot wound under 5th toe

EXAM:
LEFT FOOT - COMPLETE 3+ VIEW

[x foot lat left]
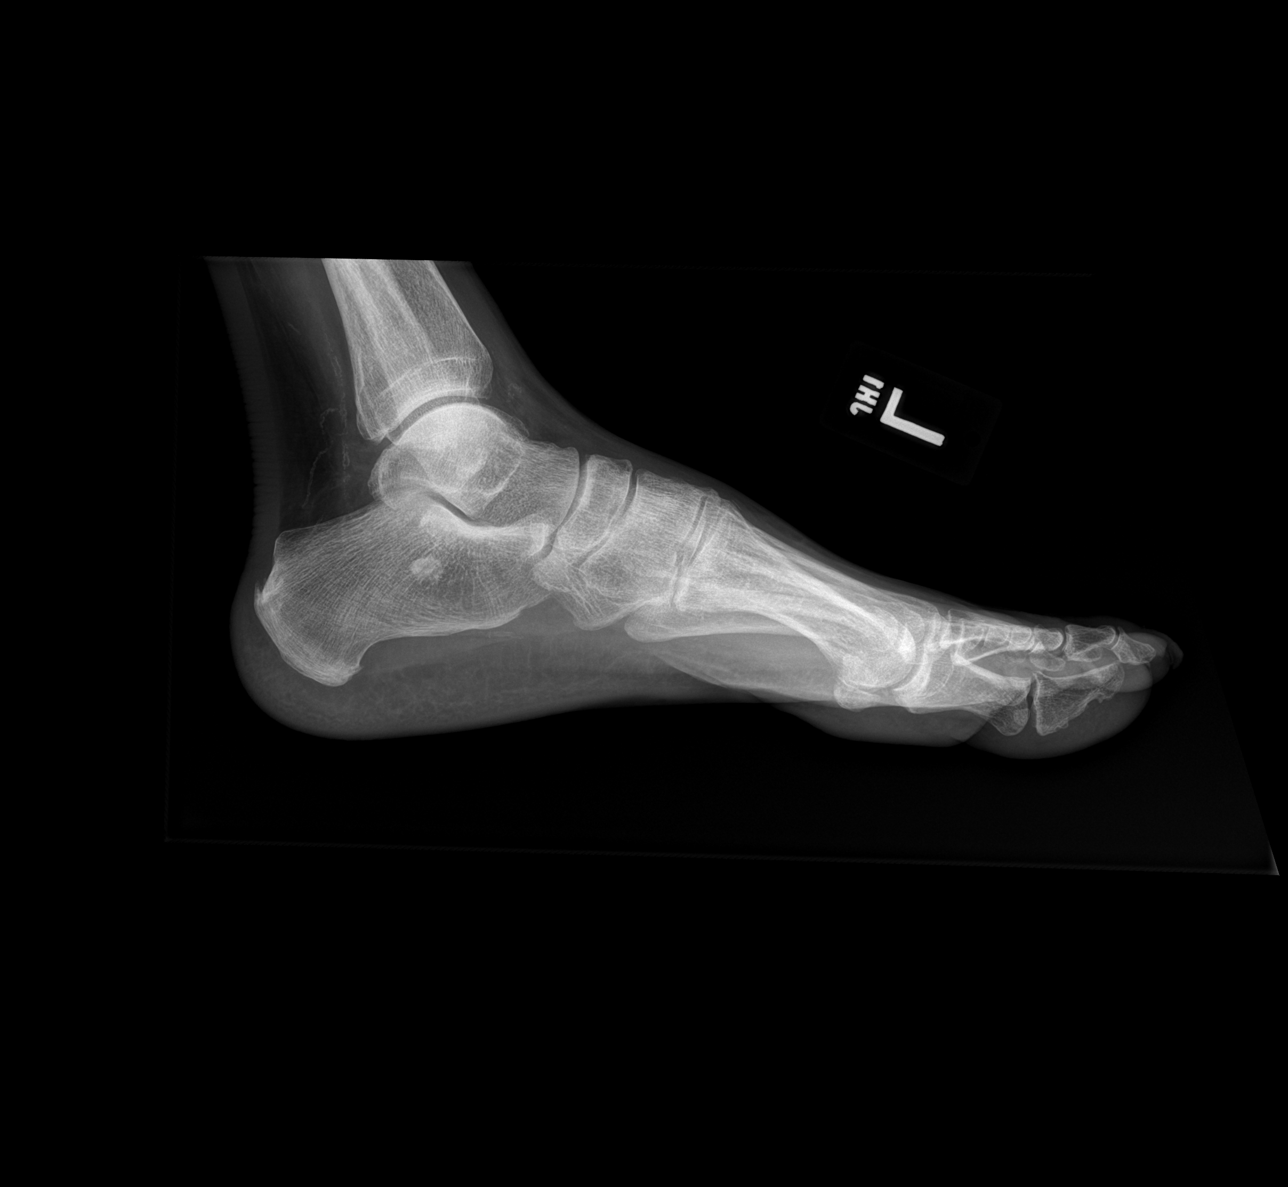

[x foot ap left]
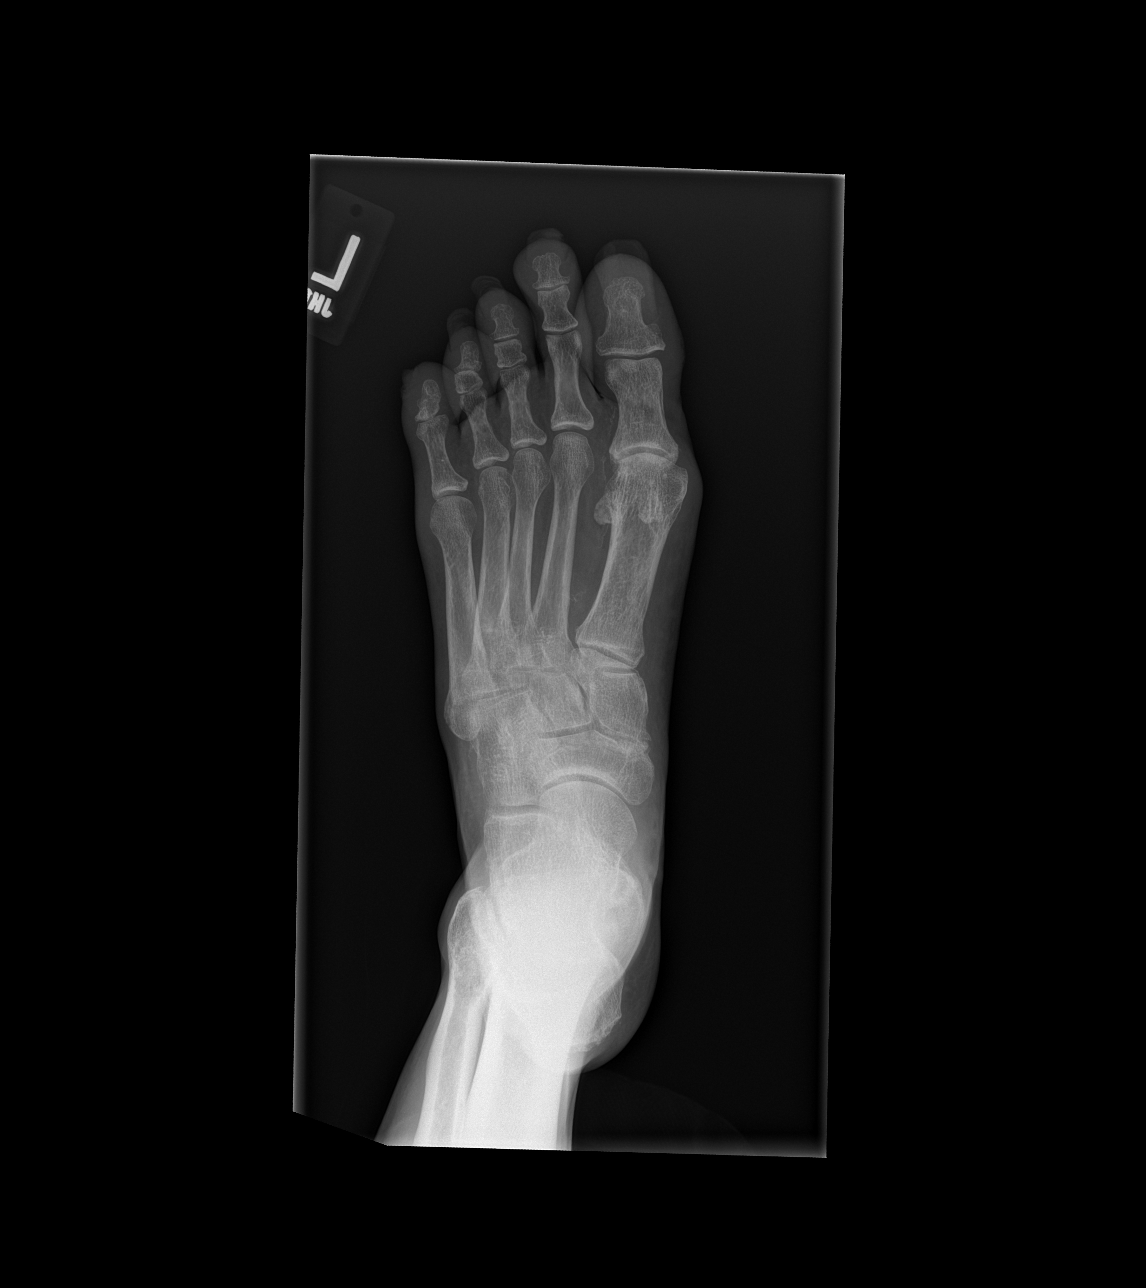

[x foot obl left]
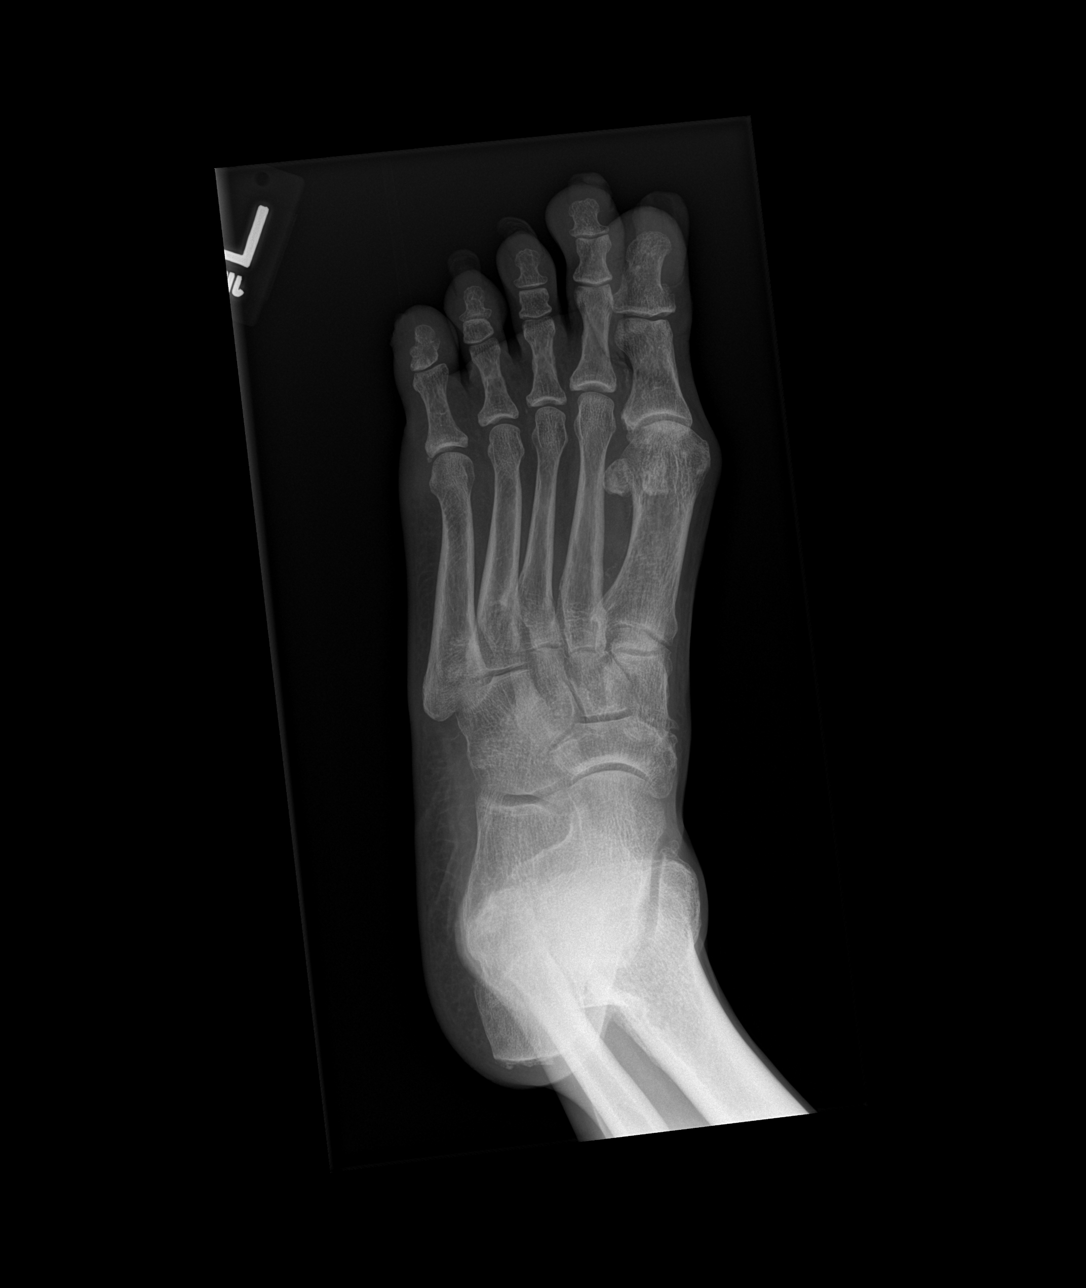

[3 of 3 positions shown; findings below may reference images not displayed]

FINDINGS: No acute bony abnormality. Specifically, no fracture, subluxation,
or dislocation. Early degenerative changes at the 1st
carpometacarpal joint. No bone destruction to suggest osteomyelitis.
IMPRESSION: No acute bony abnormality.

## 2022-04-02 IMAGING — CT CT ABD-PELV W/ CM
2 of 5 series · 16 of 46 positions shown, 18 images · IV contrast (omnipaque)
Comparison: 08/17/2019

CLINICAL DATA: Abdominal pain for 2 days

EXAM:
CT ABDOMEN AND PELVIS WITH CONTRAST
TECHNIQUE: Multidetector CT imaging of the abdomen and pelvis was performed
using the standard protocol following bolus administration of
intravenous contrast.
CONTRAST:  100mL OMNIPAQUE IOHEXOL 300 MG/ML  SOLN

[Series 2: axial st · axial · 0.70mm/px · z∈[+905,+1260]mm · 13 of 83 slices shown, 15 images]
[im 6/83  soft-tissue]
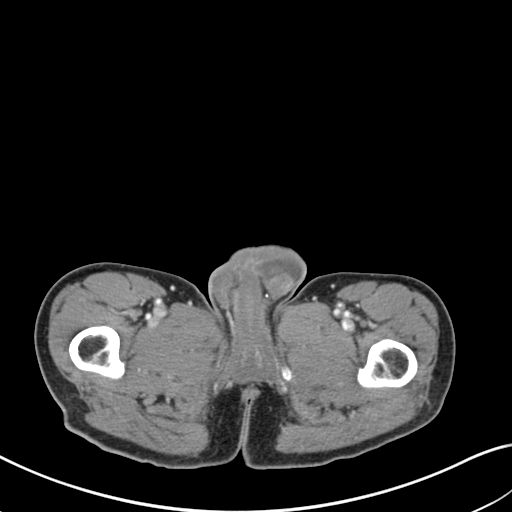
[im 6/83  bone]
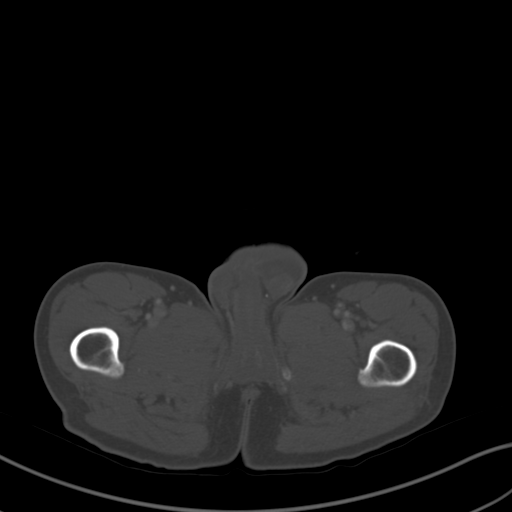
[im 12/83  soft-tissue]
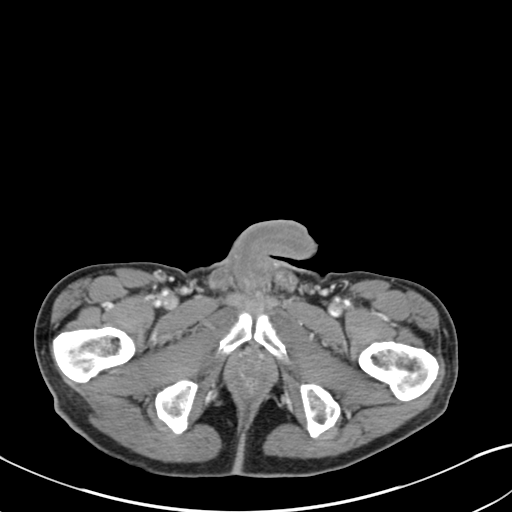
[im 18/83  soft-tissue]
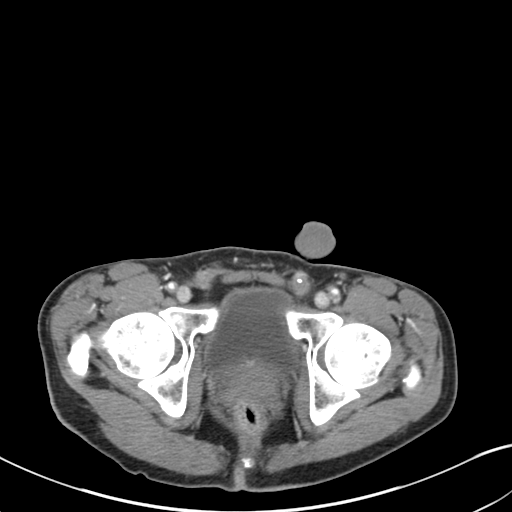
[im 24/83  soft-tissue]
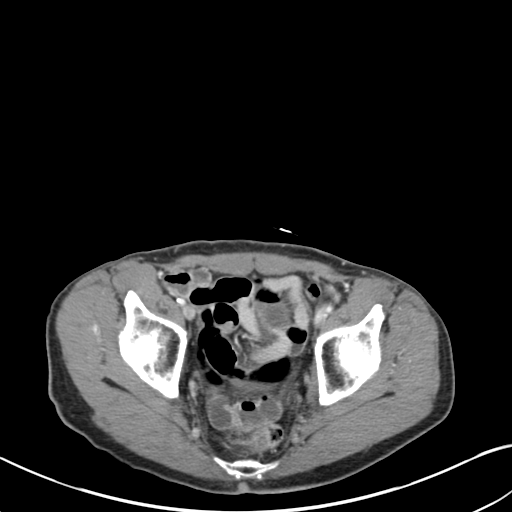
[im 30/83  soft-tissue]
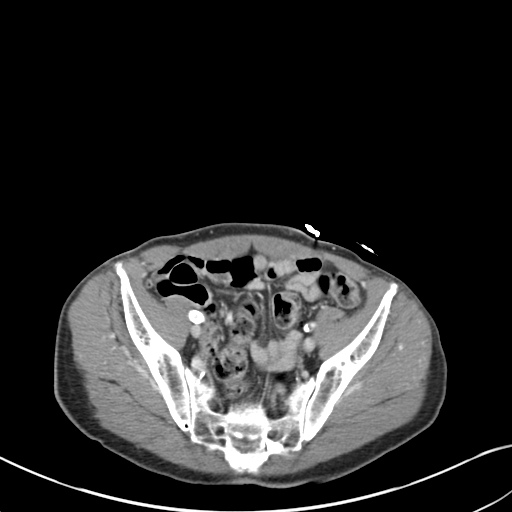
[im 36/83  soft-tissue]
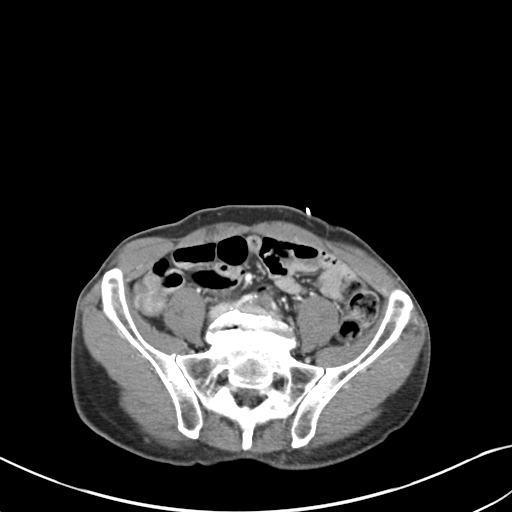
[im 42/83  soft-tissue]
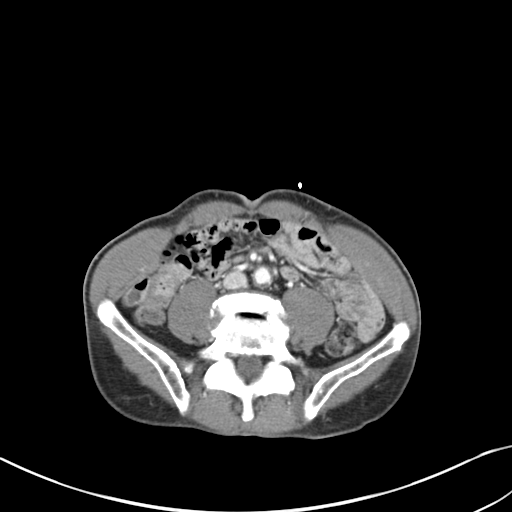
[im 47/83  soft-tissue]
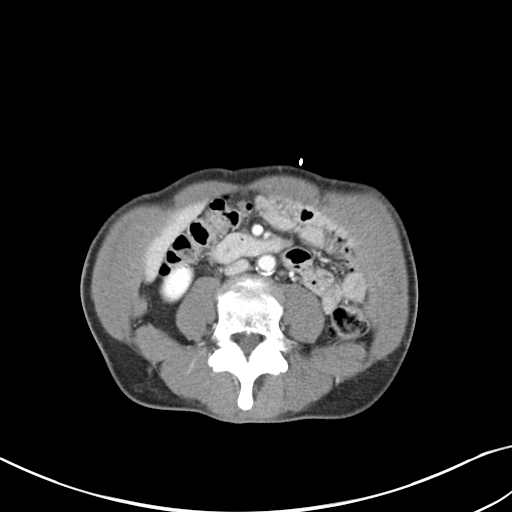
[im 53/83  soft-tissue]
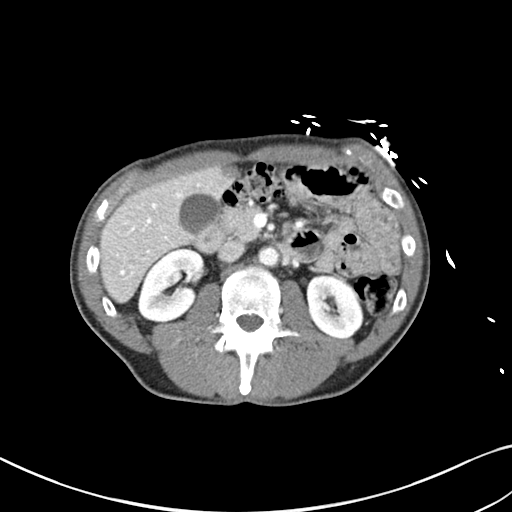
[im 53/83  bone]
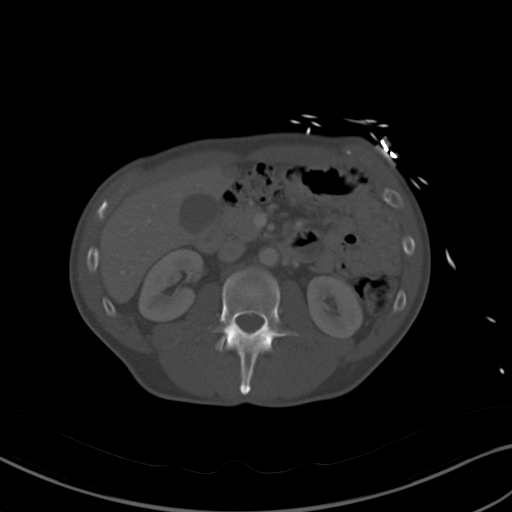
[im 59/83  soft-tissue]
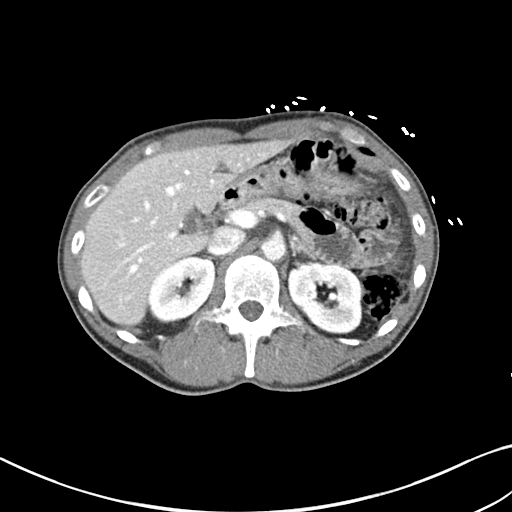
[im 65/83  soft-tissue]
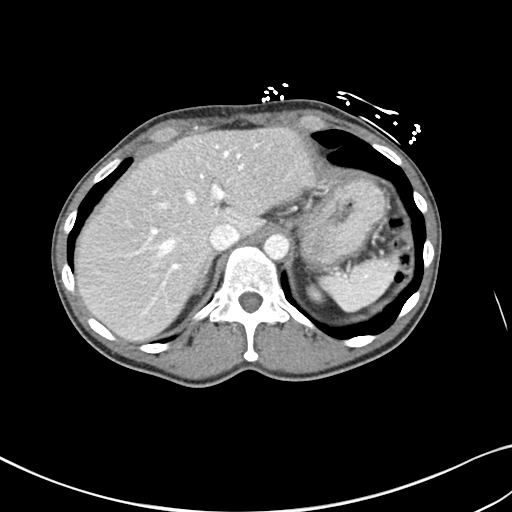
[im 71/83  soft-tissue]
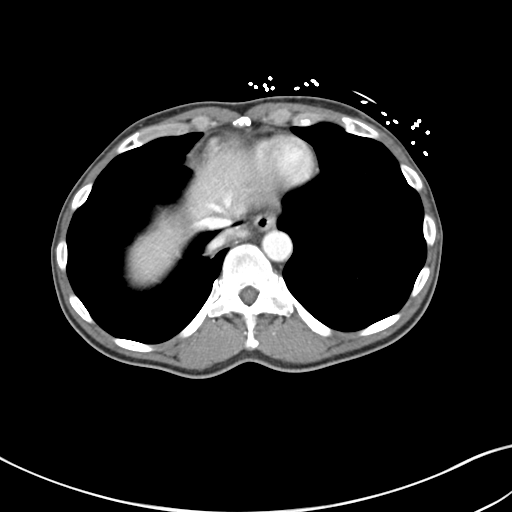
[im 77/83  soft-tissue]
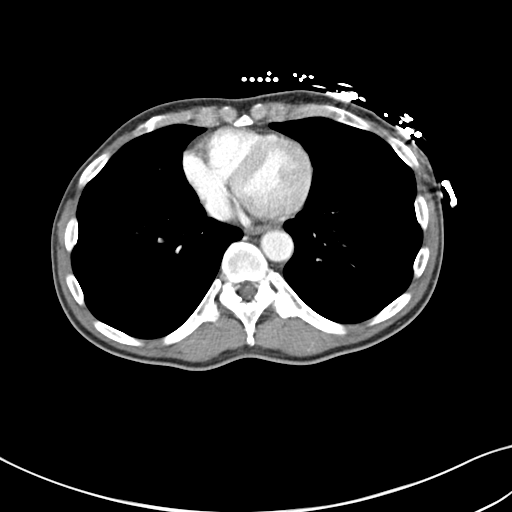

[Series 5: coronal st · coronal · 0.59mm/px · 3 of 151 slices shown]
[im 51/151  soft-tissue]
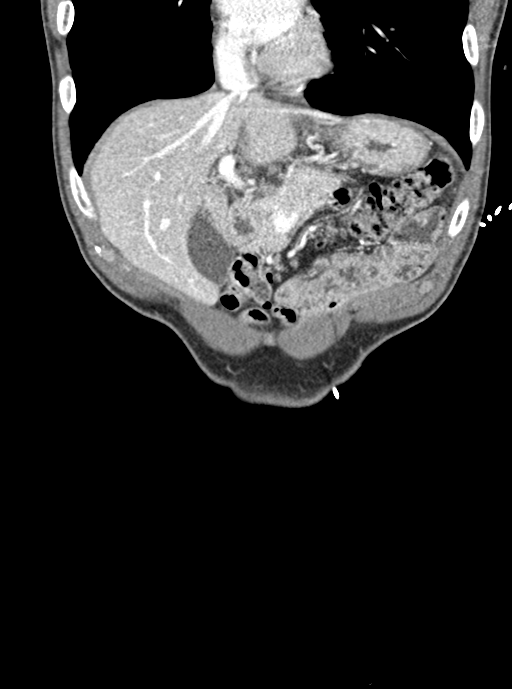
[im 67/151  soft-tissue]
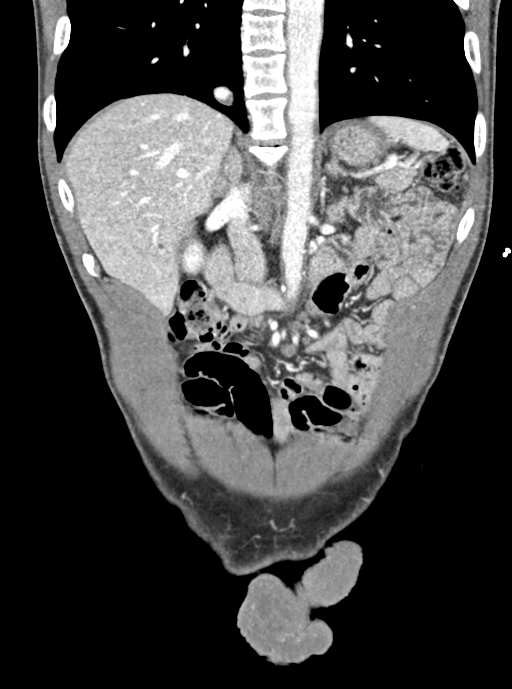
[im 84/151  soft-tissue]
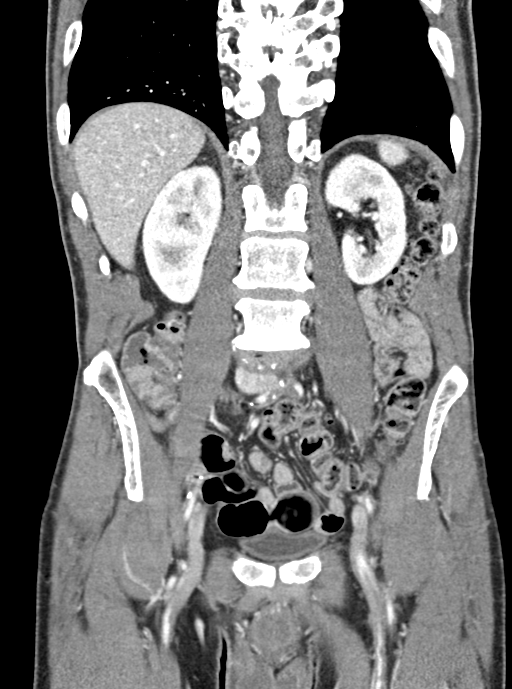

[16 of 46 positions shown; findings below may reference images not displayed]

FINDINGS: Lower chest: Lung bases are free of acute infiltrate or sizable
effusion. Large systemic branch from the abdominal aorta supplies a
portion of the right lower lobe. No true sequestration is seen. This
is stable from the prior exam

Hepatobiliary: No focal liver abnormality is seen. No gallstones,
gallbladder wall thickening, or biliary dilatation.

Pancreas: Unremarkable. No pancreatic ductal dilatation or
surrounding inflammatory changes.

Spleen: Normal in size without focal abnormality.

Adrenals/Urinary Tract: Adrenal glands are unremarkable. Kidneys
demonstrate a normal enhancement pattern bilaterally. No renal
calculi or obstructive changes are seen. Delayed images demonstrate
normal excretion of contrast. The bladder is partially distended.

Stomach/Bowel: No obstructive or inflammatory changes of the colon
are seen. The appendix is well visualized and within normal limits.
Small bowel and stomach appear unremarkable.

Vascular/Lymphatic: Aortic atherosclerosis. No enlarged abdominal or
pelvic lymph nodes. Severe stenosis of the left common iliac artery
is noted. Stenting in the common and external iliac artery on the
right is noted and patent.

Reproductive: Prostate is unremarkable.

Other: No abdominal wall hernia or abnormality. No abdominopelvic
ascites.

Musculoskeletal: Degenerative changes of lumbar spine are noted.
IMPRESSION: No acute abnormality is identified.

Stable systemic supplied to a portion of the right lower lobe lobe.

Severe stenosis of the left common iliac artery as described.

## 2022-04-13 ENCOUNTER — Inpatient Hospital Stay: Admission: RE | Admit: 2022-04-13 | Payer: Medicare Other | Source: Ambulatory Visit

## 2022-09-16 ENCOUNTER — Other Ambulatory Visit: Payer: Self-pay

## 2022-09-16 DIAGNOSIS — I739 Peripheral vascular disease, unspecified: Secondary | ICD-10-CM

## 2022-09-29 ENCOUNTER — Ambulatory Visit: Payer: Medicare HMO

## 2022-09-29 ENCOUNTER — Ambulatory Visit (HOSPITAL_COMMUNITY): Payer: Medicare HMO

## 2022-10-04 IMAGING — CT CT ABD-PELV W/ CM
2 of 5 series · 16 of 46 positions shown, 18 images · IV contrast (APPLIED)
Comparison: 10/17/2020 and previous

CLINICAL DATA: Nausea, vomiting, chills

EXAM:
CT ABDOMEN AND PELVIS WITH CONTRAST
TECHNIQUE: Multidetector CT imaging of the abdomen and pelvis was performed
using the standard protocol following bolus administration of
intravenous contrast.
CONTRAST:  50mL OMNIPAQUE IOHEXOL 350 MG/ML SOLN

[Series 3: abdomen 5.0 · axial · 0.60mm/px · z∈[+781,+1101]mm · 13 of 74 slices shown, 15 images]
[im 5/74  soft-tissue]
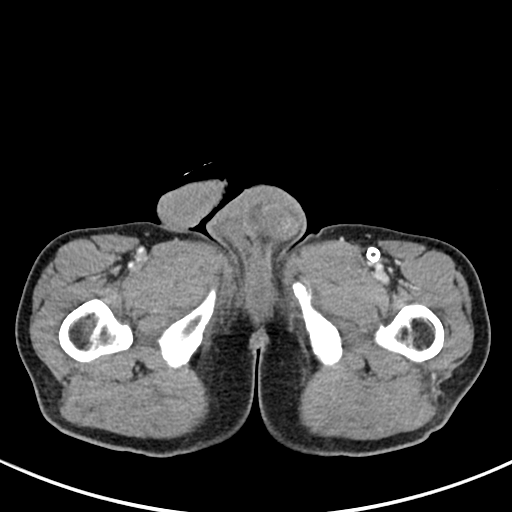
[im 5/74  bone]
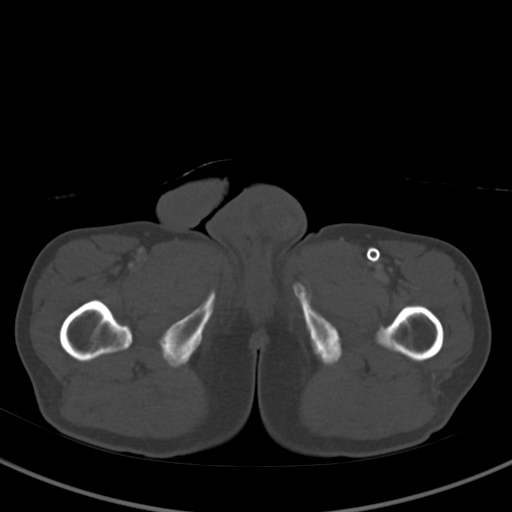
[im 9/74  soft-tissue]
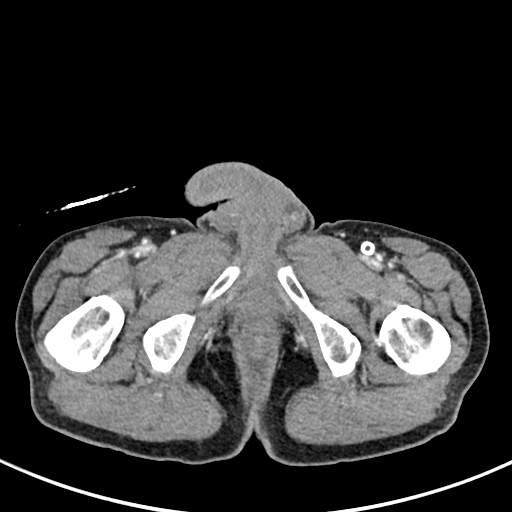
[im 18/74  soft-tissue]
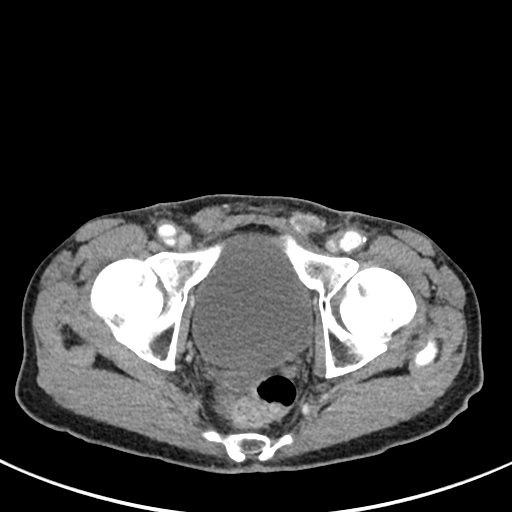
[im 22/74  soft-tissue]
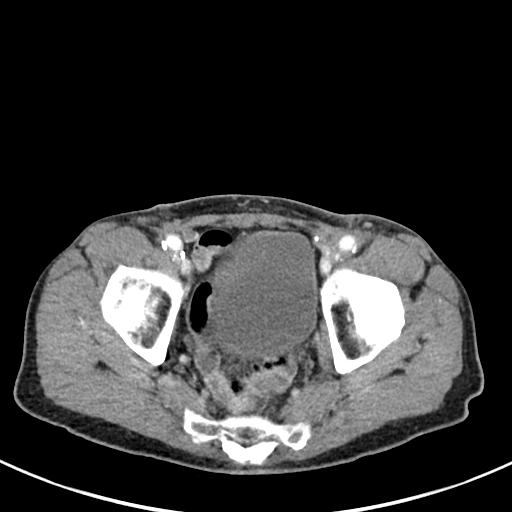
[im 26/74  soft-tissue]
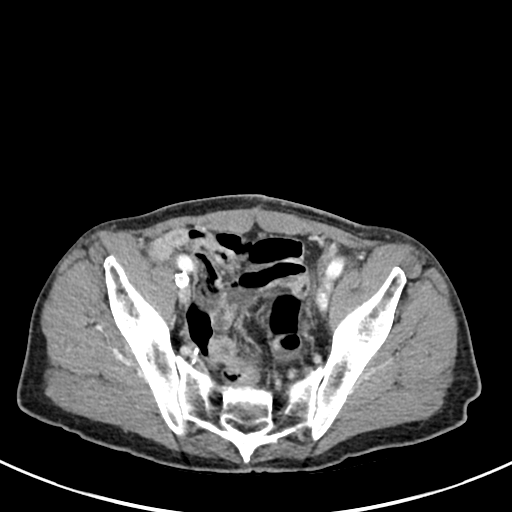
[im 31/74  soft-tissue]
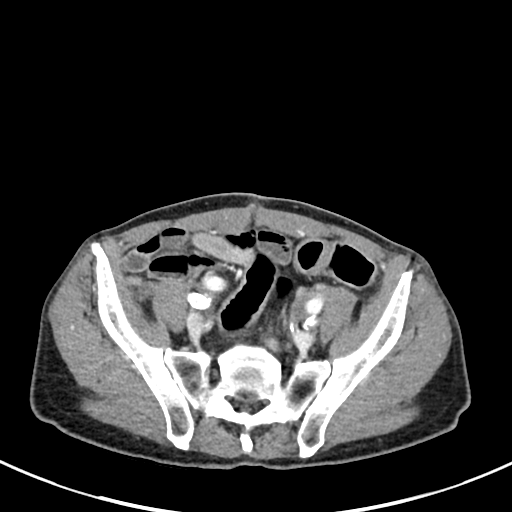
[im 39/74  soft-tissue]
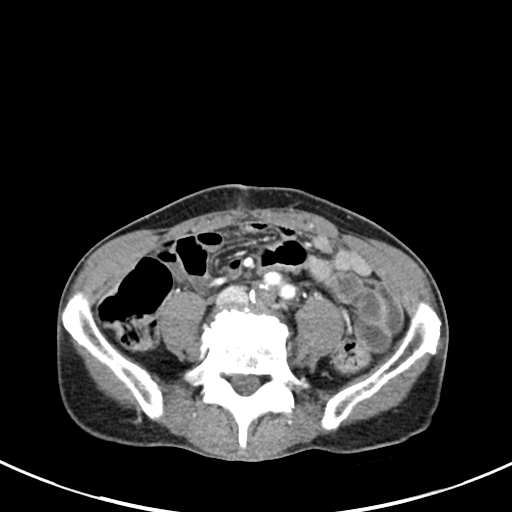
[im 43/74  soft-tissue]
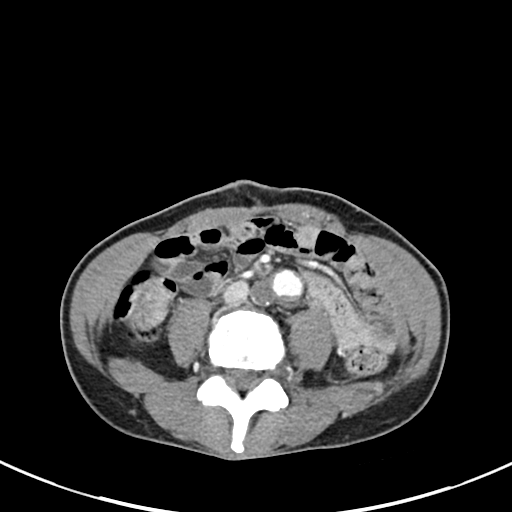
[im 48/74  soft-tissue]
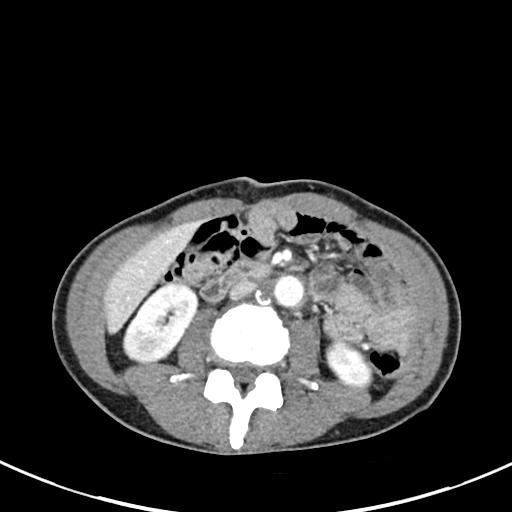
[im 48/74  bone]
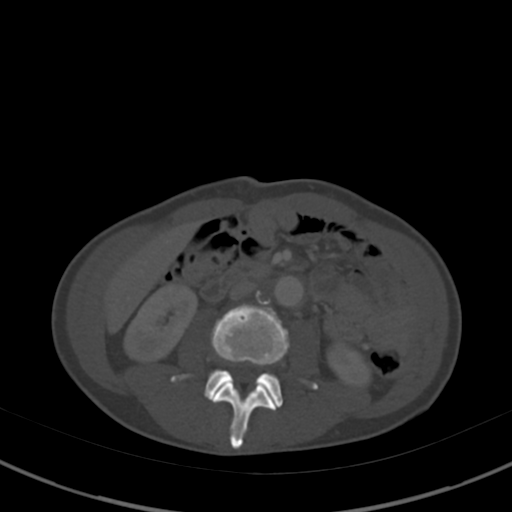
[im 52/74  soft-tissue]
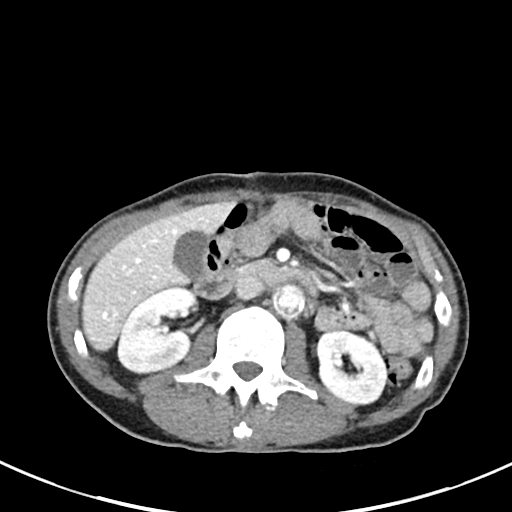
[im 56/74  soft-tissue]
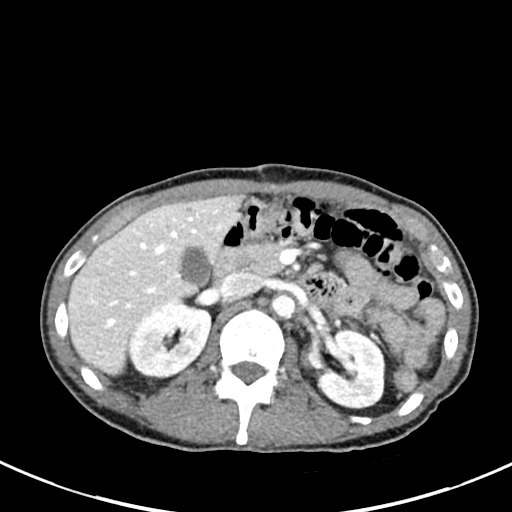
[im 65/74  soft-tissue]
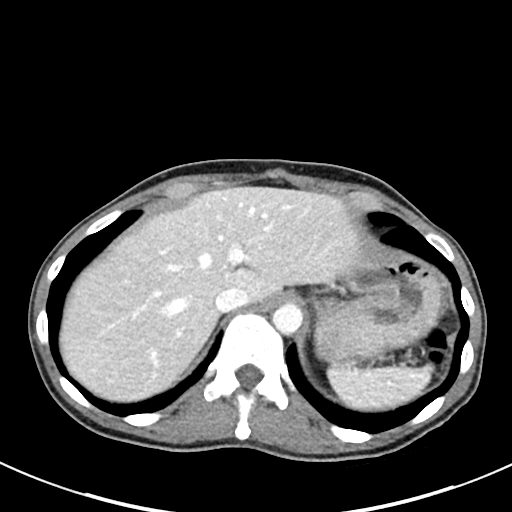
[im 69/74  soft-tissue]
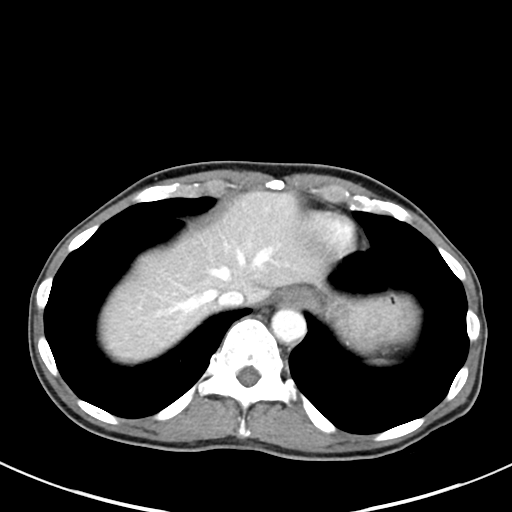

[Series 6: abdomen 3.0 mpr cor · coronal · 0.60mm/px · 3 of 66 slices shown]
[im 22/66  soft-tissue]
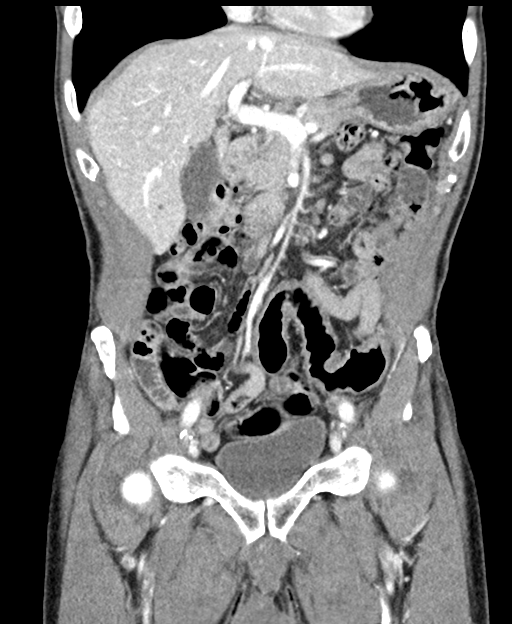
[im 29/66  soft-tissue]
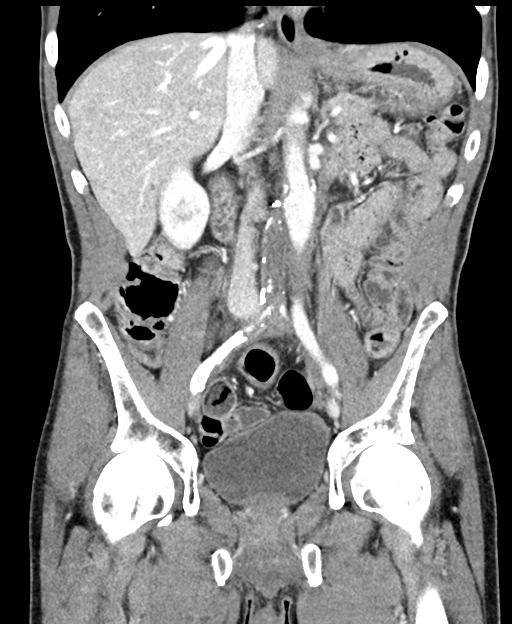
[im 37/66  soft-tissue]
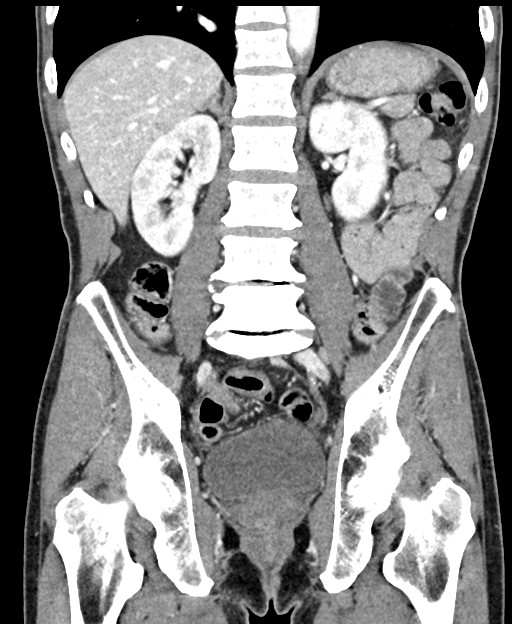

[16 of 46 positions shown; findings below may reference images not displayed]

FINDINGS: Lower chest: No pleural or pericardial effusion. Visualized lung
bases clear. Aberrant arterial branch to a portion of the right
lower lung consistent with sequestration, as noted previously.

Hepatobiliary: No focal liver abnormality is seen. No gallstones,
gallbladder wall thickening, or biliary dilatation.

Pancreas: Unremarkable. No pancreatic ductal dilatation or
surrounding inflammatory changes.

Spleen: Normal in size without focal abnormality.

Adrenals/Urinary Tract: Adrenal glands are unremarkable. Kidneys are
normal, without renal calculi, focal lesion, or hydronephrosis.
Bladder is unremarkable.

Stomach/Bowel: The stomach is nondistended, unremarkable. Small
bowel decompressed. Appendix not identified. The colon is
nondilated, unremarkable.

Vascular/Lymphatic: Moderate partially calcified atheromatous plaque
in the aorta without aneurysm. Infrarenal aortobifem graft is
patent. Thrombosed stents in the excluded bilateral iliac arterial
systems. There is occlusion of the proximal left SFA despite
presence of stent. Origin stenosis of the right SFA which remains
patent.

Reproductive: Prostate is unremarkable.

Other: No ascites.  No free air.

Musculoskeletal: Degenerative disc disease L4-S1. No fracture or
worrisome bone lesion.
IMPRESSION: 1. No acute findings.
2. Patent aortobifem graft with chronic occlusion of the proximal
left SFA.
3. Right lung extrapulmonary sequestration, a congenital anomaly.

## 2022-11-03 ENCOUNTER — Ambulatory Visit (INDEPENDENT_AMBULATORY_CARE_PROVIDER_SITE_OTHER): Payer: Medicare HMO | Admitting: Physician Assistant

## 2022-11-03 ENCOUNTER — Ambulatory Visit (HOSPITAL_COMMUNITY)
Admission: RE | Admit: 2022-11-03 | Discharge: 2022-11-03 | Disposition: A | Discharge status: Home / Self Care | Payer: Medicare HMO | Source: Ambulatory Visit | Attending: Vascular Surgery | Admitting: Vascular Surgery

## 2022-11-03 VITALS — BP 146/94 | HR 76 | Temp 97.6°F | Resp 18 | Ht 62.0 in | Wt 114.3 lb

## 2022-11-03 DIAGNOSIS — I700 Atherosclerosis of aorta: Secondary | ICD-10-CM

## 2022-11-03 DIAGNOSIS — I739 Peripheral vascular disease, unspecified: Secondary | ICD-10-CM | POA: Insufficient documentation

## 2022-11-03 LAB — VAS US ABI WITH/WO TBI
Left ABI: 0.32
Right ABI: 0.59

## 2022-11-03 NOTE — Progress Notes (Signed)
[facility] [place] HISTORY AND PHYSICAL   History of Present Illness:  Patient is a 62 y.o. year old male who presents for evaluation of claudication and rest pain for 1 week.  He has a history of aortobifemoral bypass, right and left femoral endarterectomy on October 09, 2020 by Dr. Lenell Antu. This was due to to aorto occlusive disease. He initially presented with previous gangrenous changes to his left fifth toe and had undergone amputation by the podiatry service. He has also undergone left common iliac artery, left superficial femoral and popliteal artery stent placement by Dr. Myra Gianotti in May 2022.   He state one week ago he woke up with pain in the feet and had to walk around to get it to stop as well as take pain medication.  He is having trouble sleeping.  He denies open wounds, but does have claudication around 100 feet.          Past Medical History:  Diagnosis Date   Diabetes mellitus without complication (HCC)    Hyperlipidemia    Hypertension    Peripheral vascular disease (HCC)    Tobacco use     Past Surgical History:  Procedure Laterality Date   ABDOMINAL AORTOGRAM W/LOWER EXTREMITY Left 07/16/2019   Procedure: ABDOMINAL AORTOGRAM W/LOWER EXTREMITY;  Surgeon: Runell Gess, MD;  Location: [facility];  Service: Cardiovascular;  Laterality: Left;   ABDOMINAL AORTOGRAM W/LOWER EXTREMITY Left 08/16/2019   ABDOMINAL AORTOGRAM W/LOWER EXTREMITY N/A 08/16/2019   Procedure: ABDOMINAL AORTOGRAM W/LOWER EXTREMITY;  Surgeon: Runell Gess, MD;  Location: [facility];  Service: Cardiovascular;  Laterality: N/A;   ABDOMINAL AORTOGRAM W/LOWER EXTREMITY Bilateral 12/19/2019   Procedure: ABDOMINAL AORTOGRAM W/LOWER EXTREMITY;  Surgeon: Iran Ouch, MD;  Location: [facility];  Service: Cardiovascular;  Laterality: Bilateral;   ABDOMINAL AORTOGRAM W/LOWER EXTREMITY Left 06/10/2020   Procedure: ABDOMINAL AORTOGRAM W/LOWER EXTREMITY;   Surgeon: Nada Libman, MD;  Location: [facility];  Service: Cardiovascular;  Laterality: Left;   ABDOMINAL AORTOGRAM W/LOWER EXTREMITY N/A 10/06/2020   Procedure: ABDOMINAL AORTOGRAM W/LOWER EXTREMITY;  Surgeon: Maeola Harman, MD;  Location: [facility];  Service: Cardiovascular;  Laterality: N/A;   AMPUTATION TOE Left 06/13/2020   Procedure: Left Fifth Toe Amputation ;  Surgeon: Edwin Cap, DPM;  Location: [facility] OR;  Service: Podiatry;  Laterality: Left;   AORTA - BILATERAL FEMORAL ARTERY BYPASS GRAFT Bilateral 10/09/2020   Procedure: AORTA BIFEMORAL BYPASS GRAFT;  Surgeon: [name], MD;  Location: [facility] OR;  Service: Vascular;  Laterality: Bilateral;   arm surgery     ENDARTERECTOMY FEMORAL Bilateral 10/09/2020   Procedure: ENDARTERECTOMY COMMON FEMORAL;  Surgeon: [name], MD;  Location: [facility] OR;  Service: Vascular;  Laterality: Bilateral;   PERIPHERAL VASCULAR BALLOON ANGIOPLASTY  08/16/2019   Procedure: PERIPHERAL VASCULAR BALLOON ANGIOPLASTY;  Surgeon: Runell Gess, MD;  Location: [facility];  Service: Cardiovascular;;  attempted PTA of Left SFA   PERIPHERAL VASCULAR INTERVENTION Right 07/16/2019   Procedure: PERIPHERAL VASCULAR INTERVENTION;  Surgeon: Runell Gess, MD;  Location: [facility];  Service: Cardiovascular;  Laterality: Right;   PERIPHERAL VASCULAR INTERVENTION Right 12/19/2019   Procedure: PERIPHERAL VASCULAR INTERVENTION;  Surgeon: Iran Ouch, MD;  Location: [facility];  Service: Cardiovascular;  Laterality: Right;  iliac   PERIPHERAL VASCULAR INTERVENTION Left 06/10/2020   Procedure: PERIPHERAL VASCULAR INTERVENTION;  Surgeon: Nada Libman, MD;  Location: [facility];  Service: Cardiovascular;  Laterality: Left;  SFA /COMMON ILIAC    ROS:   General:  No weight loss, Fever, chills  HEENT: No recent headaches, no nasal bleeding, no visual changes, no sore throat  Neurologic: No  dizziness, blackouts, seizures. No recent symptoms of stroke or mini- stroke. No recent episodes of slurred speech, or temporary blindness.  Cardiac: No recent episodes of chest pain/pressure, no shortness of breath at rest.  No shortness of breath with exertion.  Denies history of atrial fibrillation or irregular heartbeat  Vascular: No history of rest pain in feet.  No history of claudication.  positive history of non-healing ulcer, No history of DVT   Pulmonary: No home oxygen, no productive cough, no hemoptysis,  No asthma or wheezing  Musculoskeletal:  [ ]  Arthritis, [ ]  Low back pain,  [ ]  Joint pain  Hematologic:No history of hypercoagulable state.  No history of easy bleeding.  No history of anemia  Gastrointestinal: No hematochezia or melena,  No gastroesophageal reflux, no trouble swallowing  Urinary: [ ]  chronic Kidney disease, [ ]  on HD - [ ]  MWF or [ ]  TTHS, [ ]  Burning with urination, [ ]  Frequent urination, [ ]  Difficulty urinating;   Skin: No rashes  Psychological: No history of anxiety,  No history of depression  Social History Social History   Tobacco Use   Smoking status: Every Day    Current packs/day: 0.50    Average packs/day: 0.5 packs/day for 40.0 years (20.0 ttl pk-yrs)    Types: Cigarettes   Smokeless tobacco: Never  Vaping Use   Vaping status: Never Used  Substance Use Topics   Alcohol use: Yes    Comment: beer and liquor daily   Drug use: Yes    Frequency: 3.0 times per week    Types: Marijuana, Cocaine    Family History Family History  Problem Relation Age of Onset   Cancer Mother    Diabetes Father     Allergies  No Known Allergies   Current Outpatient Medications  Medication Sig Dispense Refill   acetaminophen (TYLENOL) 325 MG tablet Take 2 tablets (650 mg total) by mouth every 6 (six) hours as needed.     aspirin EC 81 MG EC tablet Take 1 tablet (81 mg total) by mouth daily. Swallow whole. (Patient taking differently: Take 81 mg by  mouth daily at 2 PM. Swallow whole.) 90 tablet 3   atorvastatin (LIPITOR) 40 MG tablet Take 40 mg by mouth daily.     Blood Pressure Monitor KIT Check blood pressure daily (Patient taking differently: 1 each by Other route once a week.) 1 kit 0   collagenase (SANTYL) ointment Apply topically daily. (Patient not taking: Reported on 11/03/2022) 15 g 0   gabapentin (NEURONTIN) 600 MG tablet Take 1 tablet (600 mg) in morning and afternoon. Take 2 tablets (1200 mg) at night (Patient taking differently: Take 600-1,200 mg by mouth See admin instructions. 1200 mg in the morning and at bedtime 600 mg  in the afternoon) 120 tablet 0   lisinopril-hydrochlorothiazide (ZESTORETIC) 20-25 MG tablet Take 1 tablet by mouth daily.     Multiple Vitamin (MULTIVITAMIN WITH MINERALS) TABS tablet Take 1 tablet by mouth daily.     ondansetron (ZOFRAN ODT) 4 MG disintegrating tablet 4mg  ODT q4 hours prn nausea/vomit 30 tablet 0   oxyCODONE-acetaminophen (PERCOCET) 5-325 MG tablet Take 1 tablet by mouth every 4 (four) hours as needed for severe pain. 30 tablet  0   pantoprazole (PROTONIX) 40 MG tablet Take 1 tablet (40 mg total) by mouth daily. 30 tablet 0   traMADol (ULTRAM) 50 MG tablet Take 1 tablet (50 mg total) by mouth every 6 (six) hours as needed for moderate pain. (Patient not taking: Reported on 11/03/2022) 20 tablet 0   Current Facility-Administered Medications  Medication Dose Route Frequency Provider Last Rate Last Admin   sodium chloride flush (NS) 0.9 % injection 3 mL  3 mL Intravenous Q12H Runell Gess, MD        Physical Examination  Vitals:   11/03/22 1446  BP: (!) 146/94  Pulse: 76  Resp: 18  Temp: 97.6 F (36.4 C)  TempSrc: Temporal  SpO2: 97%  Weight: 114 lb 4.8 oz (51.8 kg)  Height: 5\' 2"  (1.575 m)    Body mass index is 20.91 kg/m.  General:  Alert and oriented, no acute distress HEENT: Normal Neck: No bruit or JVD Pulmonary: Clear to auscultation bilaterally Cardiac: Regular  Rate and Rhythm without murmur Abdomen: Soft, non-tender, non-distended, no mass, no scars Skin: No rash, no gangrene or open wounds Extremity Pulses: femoral pulses non palpable no pedal pulses Musculoskeletal: No deformity or edema  Neurologic: Upper and lower extremity motor grossly intact and symmetric  DATA:  ABI Findings:  +---------+------------------+-----+-------------------+--------------+  Right   Rt Pressure (mmHg)IndexWaveform           Comment         +---------+------------------+-----+-------------------+--------------+  Brachial 181                                                       +---------+------------------+-----+-------------------+--------------+  PTA     99                0.53 monophasic                         +---------+------------------+-----+-------------------+--------------+  DP      110               0.59 dampened monophasicbarely audible  +---------+------------------+-----+-------------------+--------------+  Great Toe59                0.31 Abnormal                           +---------+------------------+-----+-------------------+--------------+   +---------+------------------+-----+-------------------+-------+  Left    Lt Pressure (mmHg)IndexWaveform           Comment  +---------+------------------+-----+-------------------+-------+  Brachial 188                                                +---------+------------------+-----+-------------------+-------+  PTA                                               Absent   +---------+------------------+-----+-------------------+-------+  PERO    69                0.38 dampened monophasic         +---------+------------------+-----+-------------------+-------+  DP  60                0.32 dampened monophasic         +---------+------------------+-----+-------------------+-------+  Great Toe59                0.31                              +---------+------------------+-----+-------------------+-------+   +-------+-----------+-----------+------------+------------+  ABI/TBIToday's ABIToday's TBIPrevious ABIPrevious TBI  +-------+-----------+-----------+------------+------------+  Right 0.59       0.31       0.81        absent        +-------+-----------+-----------+------------+------------+  Left  0.32       0.31       0.37        absent        +-------+-----------+-----------+------------+------------+           Summary:  Right: Resting right ankle-brachial index indicates moderate right lower  extremity arterial disease. The right toe-brachial index is abnormal.   Left: Resting left ankle-brachial index indicates severe left lower  extremity arterial disease. The left toe-brachial index is abnormal.     ASSESSMENT/PLAN: Aortic occlusive disease with rest pain for 1 week and increased short distance claudication.  He does not have palpable femoral pulses.  His ABI's monophasic dampened and decreased TBI.  With absent femoral pulses he may have sever aortic bypass stenosis.  I will schedule him for CTA abdomin/pelvis and LE run off.  If his pain becomes sever he will report to [facility] ED.  Continue to mobilize as tolerates.  He is on Plavix and Lipitor.         Mosetta Pigeon PA-C [facility] [place] Office: [phone]  MD in clinic [place]

## 2022-11-05 ENCOUNTER — Other Ambulatory Visit: Payer: Self-pay

## 2022-11-05 DIAGNOSIS — I700 Atherosclerosis of aorta: Secondary | ICD-10-CM

## 2022-11-10 NOTE — Addendum Note (Signed)
Addended by: Leilani Able, Johnthomas Lader A on: 11/10/2022 12:40 PM   Modules accepted: Orders

## 2022-11-23 ENCOUNTER — Other Ambulatory Visit: Payer: Medicare HMO

## 2022-11-24 ENCOUNTER — Ambulatory Visit (HOSPITAL_COMMUNITY)
Admission: RE | Admit: 2022-11-24 | Discharge: 2022-11-24 | Disposition: A | Discharge status: Home / Self Care | Payer: Medicare HMO | Source: Ambulatory Visit | Attending: Vascular Surgery | Admitting: Vascular Surgery

## 2022-11-24 DIAGNOSIS — I700 Atherosclerosis of aorta: Secondary | ICD-10-CM | POA: Diagnosis present

## 2022-11-24 LAB — POCT I-STAT CREATININE: Creatinine, Ser: 1.2 mg/dL (ref 0.61–1.24)

## 2022-11-24 MED ORDER — IOHEXOL 350 MG/ML SOLN
100.0000 mL | Freq: Once | INTRAVENOUS | Status: AC | PRN
  Administered 2022-11-24: 100 mL via INTRAVENOUS

## 2022-12-06 NOTE — H&P (View-Only) (Signed)
[facility] [place]  ASSESSMENT / PLAN: [name]. is a 62 y.o. 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  Recommend:  Abstinence from all tobacco products. Blood glucose control with goal A1c < 7%. Blood pressure control with goal blood pressure < 140/90 mmHg. Lipid reduction therapy with goal LDL-C <100 mg/dL  Aspirin 81mg  PO QD.  Atorvastatin 40-80mg  PO QD (or other "high intensity" statin therapy).  Plan left lower extremity angiogram via right common femoral approach in cath lab next available. Patient likely will need left femoral-peroneal bypass. Clinically, his greater saphenous vein appears appropriate for bypass graft.   CHIEF COMPLAINT: Left foot pain  HISTORY OF PRESENT ILLNESS: [name]. is a 62 y.o. male well-known to me having undergone aortobifemoral bypass grafting for rest pain in September 2022.  Patient recovered from this nicely.  He was seen by one of our PAs reporting similar pain in his left foot.  Femoral pulses were nonpalpable at that time and so a CT angiogram was performed.  This shows widely patent aortobifemoral bypass graft with significant infrainguinal occlusive disease.  Today the patient reports his left foot is mostly bothersome to him.  The pain will awaken him from sleep.  He also describes numbness in his foot.  VASCULAR SURGICAL HISTORY:  Aortobifemoral bypass 10/09/20 Left SFA and common iliac stenting 06/10/20   Past Medical History:  Diagnosis Date   Diabetes mellitus without complication (HCC)    Hyperlipidemia    Hypertension    Peripheral vascular disease (HCC)    Tobacco use     Past Surgical History:  Procedure Laterality Date   ABDOMINAL AORTOGRAM W/LOWER EXTREMITY Left 07/16/2019   Procedure: ABDOMINAL AORTOGRAM W/LOWER EXTREMITY;  Surgeon: Runell Gess, MD;  Location: [facility];  Service: Cardiovascular;   Laterality: Left;   ABDOMINAL AORTOGRAM W/LOWER EXTREMITY Left 08/16/2019   ABDOMINAL AORTOGRAM W/LOWER EXTREMITY N/A 08/16/2019   Procedure: ABDOMINAL AORTOGRAM W/LOWER EXTREMITY;  Surgeon: Runell Gess, MD;  Location: [facility];  Service: Cardiovascular;  Laterality: N/A;   ABDOMINAL AORTOGRAM W/LOWER EXTREMITY Bilateral 12/19/2019   Procedure: ABDOMINAL AORTOGRAM W/LOWER EXTREMITY;  Surgeon: Iran Ouch, MD;  Location: [facility];  Service: Cardiovascular;  Laterality: Bilateral;   ABDOMINAL AORTOGRAM W/LOWER EXTREMITY Left 06/10/2020   Procedure: ABDOMINAL AORTOGRAM W/LOWER EXTREMITY;  Surgeon: Nada Libman, MD;  Location: [facility];  Service: Cardiovascular;  Laterality: Left;   ABDOMINAL AORTOGRAM W/LOWER EXTREMITY N/A 10/06/2020   Procedure: ABDOMINAL AORTOGRAM W/LOWER EXTREMITY;  Surgeon: Maeola Harman, MD;  Location: [facility] INVASIVE CV LAB;  Service: Cardiovascular;  Laterality: N/A;   AMPUTATION TOE Left 06/13/2020   Procedure: Left Fifth Toe Amputation ;  Surgeon: Edwin Cap, DPM;  Location: [facility] OR;  Service: Podiatry;  Laterality: Left;   AORTA - BILATERAL FEMORAL ARTERY BYPASS GRAFT Bilateral 10/09/2020   Procedure: AORTA BIFEMORAL BYPASS GRAFT;  Surgeon: [name], MD;  Location: [facility] OR;  Service: Vascular;  Laterality: Bilateral;   arm surgery     ENDARTERECTOMY FEMORAL Bilateral 10/09/2020   Procedure: ENDARTERECTOMY COMMON FEMORAL;  Surgeon: [name], MD;  Location: [facility] OR;  Service: Vascular;  Laterality: Bilateral;   PERIPHERAL VASCULAR BALLOON ANGIOPLASTY  08/16/2019   Procedure: PERIPHERAL VASCULAR BALLOON ANGIOPLASTY;  Surgeon: Runell Gess, MD;  [facility]: [facility];  Service: Cardiovascular;;  attempted PTA of Left SFA  PERIPHERAL VASCULAR INTERVENTION Right 07/16/2019   Procedure: PERIPHERAL VASCULAR INTERVENTION;  Surgeon: Runell Gess, MD;  Location: [facility] INVASIVE CV LAB;  Service:  Cardiovascular;  Laterality: Right;   PERIPHERAL VASCULAR INTERVENTION Right 12/19/2019   Procedure: PERIPHERAL VASCULAR INTERVENTION;  Surgeon: Iran Ouch, MD;  [facility]: [facility];  Service: Cardiovascular;  Laterality: Right;  iliac   PERIPHERAL VASCULAR INTERVENTION Left 06/10/2020   Procedure: PERIPHERAL VASCULAR INTERVENTION;  Surgeon: Nada Libman, MD;  [facility]: [facility];  Service: Cardiovascular;  Laterality: Left;  SFA /COMMON ILIAC    Family History  Problem Relation Age of Onset   Cancer Mother    Diabetes Father     Social History   Socioeconomic History   Marital status: Legally Separated    Spouse name: Not on file   Number of children: Not on file   Years of education: Not on file   Highest education level: Not on file  Occupational History   Not on file  Tobacco Use   Smoking status: Every Day    Current packs/day: 0.50    Average packs/day: 0.5 packs/day for 40.0 years (20.0 ttl pk-yrs)    Types: Cigarettes   Smokeless tobacco: Never  Vaping Use   Vaping status: Never Used  Substance and Sexual Activity   Alcohol use: Yes    Comment: beer and liquor daily   Drug use: Yes    Frequency: 3.0 times per week    Types: Marijuana, Cocaine   Sexual activity: Not on file  Other Topics Concern   Not on file  Social History Narrative   Not on file   Social Determinants of Health   Financial Resource Strain: Not on file  Food Insecurity: Not on file  Transportation Needs: Not on file  Physical Activity: Not on file  Stress: Not on file  Social Connections: Not on file  Intimate Partner Violence: Not on file    No Known Allergies  Current Outpatient Medications  Medication Sig Dispense Refill   acetaminophen (TYLENOL) 325 MG tablet Take 2 tablets (650 mg total) by mouth every 6 (six) hours as needed.     aspirin EC 81 MG EC tablet Take 1 tablet (81 mg total) by mouth daily. Swallow whole. (Patient taking differently: Take 81  mg by mouth daily at 2 PM. Swallow whole.) 90 tablet 3   atorvastatin (LIPITOR) 40 MG tablet Take 40 mg by mouth daily.     Blood Pressure Monitor KIT Check blood pressure daily (Patient taking differently: 1 each by Other route once a week.) 1 kit 0   collagenase (SANTYL) ointment Apply topically daily. 15 g 0   gabapentin (NEURONTIN) 600 MG tablet Take 1 tablet (600 mg) in morning and afternoon. Take 2 tablets (1200 mg) at night (Patient taking differently: Take 600-1,200 mg by mouth See admin instructions. 1200 mg in the morning and at bedtime 600 mg  in the afternoon) 120 tablet 0   lisinopril-hydrochlorothiazide (ZESTORETIC) 20-25 MG tablet Take 1 tablet by mouth daily.     Multiple Vitamin (MULTIVITAMIN WITH MINERALS) TABS tablet Take 1 tablet by mouth daily.     ondansetron (ZOFRAN ODT) 4 MG disintegrating tablet 4mg  ODT q4 hours prn nausea/vomit 30 tablet 0   oxyCODONE-acetaminophen (PERCOCET) 5-325 MG tablet Take 1 tablet by mouth every 4 (four) hours as needed for severe pain. 30 tablet 0   pantoprazole (PROTONIX) 40 MG tablet Take 1 tablet (40 mg total) by mouth daily. 30  tablet 0   traMADol (ULTRAM) 50 MG tablet Take 1 tablet (50 mg total) by mouth every 6 (six) hours as needed for moderate pain. 20 tablet 0   Current Facility-Administered Medications  Medication Dose Route Frequency Provider Last Rate Last Admin   sodium chloride flush (NS) 0.9 % injection 3 mL  3 mL Intravenous Q12H Runell Gess, MD        PHYSICAL EXAM Vitals:   12/07/22 0906  BP: (!) 160/85  Pulse: 76  Resp: 20  Temp: 98 F (36.7 C)  SpO2: 100%  Weight: 112 lb (50.8 kg)  Height: 5\' 2"  (1.575 m)    Thin, chronically ill man in no distress Regular rate and rhythm Unlabored breathing No palpable pedal pulses Well-healed left fifth toe amputation    PERTINENT LABORATORY AND RADIOLOGIC DATA  Most recent CBC    Latest Ref Rng & Units 11/10/2020    1:50 PM 10/29/2020    2:06 AM 10/28/2020     3:45 AM  CBC  WBC 4.0 - 10.5 K/uL 7.2  6.2  5.8   Hemoglobin 13.0 - 17.0 g/dL 82.9  8.3  9.7   Hematocrit 39.0 - 52.0 % 36.4  26.7  30.6   Platelets 150 - 400 K/uL 476  386  501      Most recent CMP    Latest Ref Rng & Units 11/24/2022   12:19 PM 11/10/2020    1:50 PM 10/29/2020    2:06 AM  CMP  Glucose 70 - 99 mg/dL  562  130   BUN 8 - 23 mg/dL  48  12   Creatinine 8.65 - 1.24 mg/dL 7.84  6.96  2.95   Sodium 135 - 145 mmol/L  134  137   Potassium 3.5 - 5.1 mmol/L  4.4  3.8   Chloride 98 - 111 mmol/L  96  102   CO2 22 - 32 mmol/L  24  29   Calcium 8.9 - 10.3 mg/dL  28.4  9.0     Renal function Estimated Creatinine Clearance: 45.3 mL/min (by C-G formula based on SCr of 1.2 mg/dL).  Hgb A1c MFr Bld (%)  Date Value  10/06/2020 5.5    LDL Chol Calc (NIH)  Date Value Ref Range Status  01/16/2020 100 (H) 0 - 99 mg/dL Final   LDL Cholesterol  Date Value Ref Range Status  10/06/2020 117 (H) 0 - 99 mg/dL Final    Comment:           Total Cholesterol/HDL:CHD Risk Coronary Heart Disease Risk Table                     Men   Women  1/2 Average Risk   3.4   3.3  Average Risk       5.0   4.4  2 X Average Risk   9.6   7.1  3 X Average Risk  23.4   11.0        Use the calculated Patient Ratio above and the CHD Risk Table to determine the patient's CHD Risk.        ATP III CLASSIFICATION (LDL):  <100     mg/dL   Optimal  132-440  mg/dL   Near or Above                    Optimal  130-159  mg/dL   Borderline  102-725  mg/dL   High  >366  mg/dL   Very High Performed at [facility], [address], [place], [place] [address]      +-------+-----------+-----------+------------+------------+  ABI/TBIToday's ABIToday's TBIPrevious ABIPrevious TBI  +-------+-----------+-----------+------------+------------+  Right 0.59       0.31       0.81        absent        +-------+-----------+-----------+------------+------------+  Left  0.32       0.31        0.37        absent        +-------+-----------+-----------+------------+------------+     CT angiogram MPRESSION: Vascular Impression:   1. Stable sequela of previous open aorto bi femoral bypass grafting. The bypass graft appears widely patent without evidence of in stent stenosis. 2. Redemonstrated 50% luminal narrowing of the origin and proximal aspect of the SMA. 3. 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   Vascular Impression of the right lower extremity:   1. Interval occlusion of the right superficial femoral artery at the level of the proximal/mid femur, new compared to the 10/2020 examination. 2. Suspected 50% luminal narrowing involving the proximal aspect of the right superficial femoral artery. 3. Single-vessel runoff to the right lower leg and foot via the peroneal artery, similar to the 10/2020 examination. No evidence of distal embolism.   Vascular Impression of the left lower extremity:   1. 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 2. Single-vessel runoff to the left lower leg and foot via the left anterior tibial artery, similar to the 10/2020 examination.   Nonvascular Impression:   1. No acute findings within the abdomen or pelvis.  Rande Brunt. Lenell Antu, MD FACS [facility] Phone Number: [phone] 12/07/2022 11:09 AM   Total time spent on preparing this encounter including chart review, data review, collecting history, examining the patient, coordinating care for this established patient, 30 minutes.  Portions of this report may have been transcribed using voice recognition  software.  Every effort has been made to ensure accuracy; however, inadvertent computerized transcription errors may still be present.

## 2022-12-06 NOTE — H&P (View-Only) (Signed)
[facility] [place]  ASSESSMENT / PLAN: [name]. is a 62 y.o. 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  Recommend:  Abstinence from all tobacco products. Blood glucose control with goal A1c < 7%. Blood pressure control with goal blood pressure < 140/90 mmHg. Lipid reduction therapy with goal LDL-C <100 mg/dL  Aspirin 81mg  PO QD.  Atorvastatin 40-80mg  PO QD (or other "high intensity" statin therapy).  Plan left lower extremity angiogram via right common femoral approach in cath lab next available. Patient likely will need left femoral-peroneal bypass. Clinically, his greater saphenous vein appears appropriate for bypass graft.   CHIEF COMPLAINT: Left foot pain  HISTORY OF PRESENT ILLNESS: [name]. is a 62 y.o. male well-known to me having undergone aortobifemoral bypass grafting for rest pain in September 2022.  Patient recovered from this nicely.  He was seen by one of our PAs reporting similar pain in his left foot.  Femoral pulses were nonpalpable at that time and so a CT angiogram was performed.  This shows widely patent aortobifemoral bypass graft with significant infrainguinal occlusive disease.  Today the patient reports his left foot is mostly bothersome to him.  The pain will awaken him from sleep.  He also describes numbness in his foot.  VASCULAR SURGICAL HISTORY:  Aortobifemoral bypass 10/09/20 Left SFA and common iliac stenting 06/10/20   Past Medical History:  Diagnosis Date   Diabetes mellitus without complication (HCC)    Hyperlipidemia    Hypertension    Peripheral vascular disease (HCC)    Tobacco use     Past Surgical History:  Procedure Laterality Date   ABDOMINAL AORTOGRAM W/LOWER EXTREMITY Left 07/16/2019   Procedure: ABDOMINAL AORTOGRAM W/LOWER EXTREMITY;  Surgeon: Runell Gess, MD;  Location: [facility];  Service: Cardiovascular;   Laterality: Left;   ABDOMINAL AORTOGRAM W/LOWER EXTREMITY Left 08/16/2019   ABDOMINAL AORTOGRAM W/LOWER EXTREMITY N/A 08/16/2019   Procedure: ABDOMINAL AORTOGRAM W/LOWER EXTREMITY;  Surgeon: Runell Gess, MD;  Location: [facility];  Service: Cardiovascular;  Laterality: N/A;   ABDOMINAL AORTOGRAM W/LOWER EXTREMITY Bilateral 12/19/2019   Procedure: ABDOMINAL AORTOGRAM W/LOWER EXTREMITY;  Surgeon: Iran Ouch, MD;  Location: [facility];  Service: Cardiovascular;  Laterality: Bilateral;   ABDOMINAL AORTOGRAM W/LOWER EXTREMITY Left 06/10/2020   Procedure: ABDOMINAL AORTOGRAM W/LOWER EXTREMITY;  Surgeon: Nada Libman, MD;  Location: [facility];  Service: Cardiovascular;  Laterality: Left;   ABDOMINAL AORTOGRAM W/LOWER EXTREMITY N/A 10/06/2020   Procedure: ABDOMINAL AORTOGRAM W/LOWER EXTREMITY;  Surgeon: Maeola Harman, MD;  Location: [facility] INVASIVE CV LAB;  Service: Cardiovascular;  Laterality: N/A;   AMPUTATION TOE Left 06/13/2020   Procedure: Left Fifth Toe Amputation ;  Surgeon: Edwin Cap, DPM;  Location: [facility] OR;  Service: Podiatry;  Laterality: Left;   AORTA - BILATERAL FEMORAL ARTERY BYPASS GRAFT Bilateral 10/09/2020   Procedure: AORTA BIFEMORAL BYPASS GRAFT;  Surgeon: [name], MD;  Location: [facility] OR;  Service: Vascular;  Laterality: Bilateral;   arm surgery     ENDARTERECTOMY FEMORAL Bilateral 10/09/2020   Procedure: ENDARTERECTOMY COMMON FEMORAL;  Surgeon: [name], MD;  Location: [facility] OR;  Service: Vascular;  Laterality: Bilateral;   PERIPHERAL VASCULAR BALLOON ANGIOPLASTY  08/16/2019   Procedure: PERIPHERAL VASCULAR BALLOON ANGIOPLASTY;  Surgeon: Runell Gess, MD;  Location: [facility];  Service: Cardiovascular;;  attempted PTA of Left SFA  PERIPHERAL VASCULAR INTERVENTION Right 07/16/2019   Procedure: PERIPHERAL VASCULAR INTERVENTION;  Surgeon: Runell Gess, MD;  Location: [facility] INVASIVE CV LAB;  Service:  Cardiovascular;  Laterality: Right;   PERIPHERAL VASCULAR INTERVENTION Right 12/19/2019   Procedure: PERIPHERAL VASCULAR INTERVENTION;  Surgeon: Iran Ouch, MD;  Location: [facility];  Service: Cardiovascular;  Laterality: Right;  iliac   PERIPHERAL VASCULAR INTERVENTION Left 06/10/2020   Procedure: PERIPHERAL VASCULAR INTERVENTION;  Surgeon: Nada Libman, MD;  [facility]: [facility];  Service: Cardiovascular;  Laterality: Left;  SFA /COMMON ILIAC    Family History  Problem Relation Age of Onset   Cancer Mother    Diabetes Father     Social History   Socioeconomic History   Marital status: Legally Separated    Spouse name: Not on file   Number of children: Not on file   Years of education: Not on file   Highest education level: Not on file  Occupational History   Not on file  Tobacco Use   Smoking status: Every Day    Current packs/day: 0.50    Average packs/day: 0.5 packs/day for 40.0 years (20.0 ttl pk-yrs)    Types: Cigarettes   Smokeless tobacco: Never  Vaping Use   Vaping status: Never Used  Substance and Sexual Activity   Alcohol use: Yes    Comment: beer and liquor daily   Drug use: Yes    Frequency: 3.0 times per week    Types: Marijuana, Cocaine   Sexual activity: Not on file  Other Topics Concern   Not on file  Social History Narrative   Not on file   Social Determinants of Health   Financial Resource Strain: Not on file  Food Insecurity: Not on file  Transportation Needs: Not on file  Physical Activity: Not on file  Stress: Not on file  Social Connections: Not on file  Intimate Partner Violence: Not on file    No Known Allergies  Current Outpatient Medications  Medication Sig Dispense Refill   acetaminophen (TYLENOL) 325 MG tablet Take 2 tablets (650 mg total) by mouth every 6 (six) hours as needed.     aspirin EC 81 MG EC tablet Take 1 tablet (81 mg total) by mouth daily. Swallow whole. (Patient taking differently: Take 81  mg by mouth daily at 2 PM. Swallow whole.) 90 tablet 3   atorvastatin (LIPITOR) 40 MG tablet Take 40 mg by mouth daily.     Blood Pressure Monitor KIT Check blood pressure daily (Patient taking differently: 1 each by Other route once a week.) 1 kit 0   collagenase (SANTYL) ointment Apply topically daily. 15 g 0   gabapentin (NEURONTIN) 600 MG tablet Take 1 tablet (600 mg) in morning and afternoon. Take 2 tablets (1200 mg) at night (Patient taking differently: Take 600-1,200 mg by mouth See admin instructions. 1200 mg in the morning and at bedtime 600 mg  in the afternoon) 120 tablet 0   lisinopril-hydrochlorothiazide (ZESTORETIC) 20-25 MG tablet Take 1 tablet by mouth daily.     Multiple Vitamin (MULTIVITAMIN WITH MINERALS) TABS tablet Take 1 tablet by mouth daily.     ondansetron (ZOFRAN ODT) 4 MG disintegrating tablet 4mg  ODT q4 hours prn nausea/vomit 30 tablet 0   oxyCODONE-acetaminophen (PERCOCET) 5-325 MG tablet Take 1 tablet by mouth every 4 (four) hours as needed for severe pain. 30 tablet 0   pantoprazole (PROTONIX) 40 MG tablet Take 1 tablet (40 mg total) by mouth daily. 30  tablet 0   traMADol (ULTRAM) 50 MG tablet Take 1 tablet (50 mg total) by mouth every 6 (six) hours as needed for moderate pain. 20 tablet 0   Current Facility-Administered Medications  Medication Dose Route Frequency Provider Last Rate Last Admin   sodium chloride flush (NS) 0.9 % injection 3 mL  3 mL Intravenous Q12H Runell Gess, MD        PHYSICAL EXAM Vitals:   12/07/22 0906  BP: (!) 160/85  Pulse: 76  Resp: 20  Temp: 98 F (36.7 C)  SpO2: 100%  Weight: 112 lb (50.8 kg)  Height: 5\' 2"  (1.575 m)    Thin, chronically ill man in no distress Regular rate and rhythm Unlabored breathing No palpable pedal pulses Well-healed left fifth toe amputation    PERTINENT LABORATORY AND RADIOLOGIC DATA  Most recent CBC    Latest Ref Rng & Units 11/10/2020    1:50 PM 10/29/2020    2:06 AM 10/28/2020     3:45 AM  CBC  WBC 4.0 - 10.5 K/uL 7.2  6.2  5.8   Hemoglobin 13.0 - 17.0 g/dL 16.1  8.3  9.7   Hematocrit 39.0 - 52.0 % 36.4  26.7  30.6   Platelets 150 - 400 K/uL 476  386  501      Most recent CMP    Latest Ref Rng & Units 11/24/2022   12:19 PM 11/10/2020    1:50 PM 10/29/2020    2:06 AM  CMP  Glucose 70 - 99 mg/dL  096  045   BUN 8 - 23 mg/dL  48  12   Creatinine 4.09 - 1.24 mg/dL 8.11  9.14  7.82   Sodium 135 - 145 mmol/L  134  137   Potassium 3.5 - 5.1 mmol/L  4.4  3.8   Chloride 98 - 111 mmol/L  96  102   CO2 22 - 32 mmol/L  24  29   Calcium 8.9 - 10.3 mg/dL  95.6  9.0     Renal function Estimated Creatinine Clearance: 45.3 mL/min (by C-G formula based on SCr of 1.2 mg/dL).  Hgb A1c MFr Bld (%)  Date Value  10/06/2020 5.5    LDL Chol Calc (NIH)  Date Value Ref Range Status  01/16/2020 100 (H) 0 - 99 mg/dL Final   LDL Cholesterol  Date Value Ref Range Status  10/06/2020 117 (H) 0 - 99 mg/dL Final    Comment:           Total Cholesterol/HDL:CHD Risk Coronary Heart Disease Risk Table                     Men   Women  1/2 Average Risk   3.4   3.3  Average Risk       5.0   4.4  2 X Average Risk   9.6   7.1  3 X Average Risk  23.4   11.0        Use the calculated Patient Ratio above and the CHD Risk Table to determine the patient's CHD Risk.        ATP III CLASSIFICATION (LDL):  <100     mg/dL   Optimal  213-086  mg/dL   Near or Above                    Optimal  130-159  mg/dL   Borderline  578-469  mg/dL   High  >629  mg/dL   Very High Performed at [facility], [address], [place], [place] [address]      +-------+-----------+-----------+------------+------------+  ABI/TBIToday's ABIToday's TBIPrevious ABIPrevious TBI  +-------+-----------+-----------+------------+------------+  Right 0.59       0.31       0.81        absent        +-------+-----------+-----------+------------+------------+  Left  0.32       0.31        0.37        absent        +-------+-----------+-----------+------------+------------+     CT angiogram MPRESSION: Vascular Impression:   1. Stable sequela of previous open aorto bi femoral bypass grafting. The bypass graft appears widely patent without evidence of in stent stenosis. 2. Redemonstrated 50% luminal narrowing of the origin and proximal aspect of the SMA. 3. 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   Vascular Impression of the right lower extremity:   1. Interval occlusion of the right superficial femoral artery at the level of the proximal/mid femur, new compared to the 10/2020 examination. 2. Suspected 50% luminal narrowing involving the proximal aspect of the right superficial femoral artery. 3. Single-vessel runoff to the right lower leg and foot via the peroneal artery, similar to the 10/2020 examination. No evidence of distal embolism.   Vascular Impression of the left lower extremity:   1. 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 2. Single-vessel runoff to the left lower leg and foot via the left anterior tibial artery, similar to the 10/2020 examination.   Nonvascular Impression:   1. No acute findings within the abdomen or pelvis.  Rande Brunt. Lenell Antu, MD FACS [facility] Phone Number: [phone] 12/07/2022 11:09 AM   Total time spent on preparing this encounter including chart review, data review, collecting history, examining the patient, coordinating care for this established patient, 30 minutes.  Portions of this report may have been transcribed using voice recognition  software.  Every effort has been made to ensure accuracy; however, inadvertent computerized transcription errors may still be present.

## 2022-12-06 NOTE — Progress Notes (Unsigned)
[facility] [place]  ASSESSMENT / PLAN: [name]. is a 62 y.o. 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  Recommend:  Abstinence from all tobacco products. Blood glucose control with goal A1c < 7%. Blood pressure control with goal blood pressure < 140/90 mmHg. Lipid reduction therapy with goal LDL-C <100 mg/dL  Aspirin 81mg  PO QD.  Atorvastatin 40-80mg  PO QD (or other "high intensity" statin therapy).  Plan left lower extremity angiogram via right common femoral approach in cath lab next available. Patient likely will need left femoral-peroneal bypass. Clinically, his greater saphenous vein appears appropriate for bypass graft.   CHIEF COMPLAINT: Left foot pain  HISTORY OF PRESENT ILLNESS: [name]. is a 62 y.o. male well-known to me having undergone aortobifemoral bypass grafting for rest pain in September 2022.  Patient recovered from this nicely.  He was seen by one of our PAs reporting similar pain in his left foot.  Femoral pulses were nonpalpable at that time and so a CT angiogram was performed.  This shows widely patent aortobifemoral bypass graft with significant infrainguinal occlusive disease.  Today the patient reports his left foot is mostly bothersome to him.  The pain will awaken him from sleep.  He also describes numbness in his foot.  VASCULAR SURGICAL HISTORY:  Aortobifemoral bypass 10/09/20 Left SFA and common iliac stenting 06/10/20   Past Medical History:  Diagnosis Date   Diabetes mellitus without complication (HCC)    Hyperlipidemia    Hypertension    Peripheral vascular disease (HCC)    Tobacco use     Past Surgical History:  Procedure Laterality Date   ABDOMINAL AORTOGRAM W/LOWER EXTREMITY Left 07/16/2019   Procedure: ABDOMINAL AORTOGRAM W/LOWER EXTREMITY;  Surgeon: Runell Gess, MD;  Location: [facility];  Service: Cardiovascular;   Laterality: Left;   ABDOMINAL AORTOGRAM W/LOWER EXTREMITY Left 08/16/2019   ABDOMINAL AORTOGRAM W/LOWER EXTREMITY N/A 08/16/2019   Procedure: ABDOMINAL AORTOGRAM W/LOWER EXTREMITY;  Surgeon: Runell Gess, MD;  Location: [facility];  Service: Cardiovascular;  Laterality: N/A;   ABDOMINAL AORTOGRAM W/LOWER EXTREMITY Bilateral 12/19/2019   Procedure: ABDOMINAL AORTOGRAM W/LOWER EXTREMITY;  Surgeon: Iran Ouch, MD;  Location: [facility];  Service: Cardiovascular;  Laterality: Bilateral;   ABDOMINAL AORTOGRAM W/LOWER EXTREMITY Left 06/10/2020   Procedure: ABDOMINAL AORTOGRAM W/LOWER EXTREMITY;  Surgeon: Nada Libman, MD;  Location: [facility];  Service: Cardiovascular;  Laterality: Left;   ABDOMINAL AORTOGRAM W/LOWER EXTREMITY N/A 10/06/2020   Procedure: ABDOMINAL AORTOGRAM W/LOWER EXTREMITY;  Surgeon: Maeola Harman, MD;  Location: [facility] IN[facility] LAB;  Service: Cardiovascular;  Laterality: N/A;   AMPUTATION TOE Left 06/13/2020   Procedure: Left Fifth Toe Amputation ;  Surgeon: Edwin Cap, DPM;  Location: [facility] OR;  Service: Podiatry;  Laterality: Left;   AORTA - BILATERAL FEMORAL ARTERY BYPASS GRAFT Bilateral 10/09/2020   Procedure: AORTA BIFEMORAL BYPASS GRAFT;  Surgeon: [name], MD;  Location: [facility] OR;  Service: Vascular;  Laterality: Bilateral;   arm surgery     ENDARTERECTOMY FEMORAL Bilateral 10/09/2020   Procedure: ENDARTERECTOMY COMMON FEMORAL;  Surgeon: [name], MD;  Location: [facility] OR;  Service: Vascular;  Laterality: Bilateral;   PERIPHERAL VASCULAR BALLOON ANGIOPLASTY  08/16/2019   Procedure: PERIPHERAL VASCULAR BALLOON ANGIOPLASTY;  Surgeon: Runell Gess, MD;  Location: [facility];  Service: Cardiovascular;;  attempted PTA of Left SFA  PERIPHERAL VASCULAR INTERVENTION Right 07/16/2019   Procedure: PERIPHERAL VASCULAR INTERVENTION;  Surgeon: Runell Gess, MD;  Location: [facility] INVASIVE CV LAB;  Service:  Cardiovascular;  Laterality: Right;   PERIPHERAL VASCULAR INTERVENTION Right 12/19/2019   Procedure: PERIPHERAL VASCULAR INTERVENTION;  Surgeon: Iran Ouch, MD;  Location: [facility];  Service: Cardiovascular;  Laterality: Right;  iliac   PERIPHERAL VASCULAR INTERVENTION Left 06/10/2020   Procedure: PERIPHERAL VASCULAR INTERVENTION;  Surgeon: Nada Libman, MD;  Location: [facility];  Service: Cardiovascular;  Laterality: Left;  SFA /COMMON ILIAC    Family History  Problem Relation Age of Onset   Cancer Mother    Diabetes Father     Social History   Socioeconomic History   Marital status: Legally Separated    Spouse name: Not on file   Number of children: Not on file   Years of education: Not on file   Highest education level: Not on file  Occupational History   Not on file  Tobacco Use   Smoking status: Every Day    Current packs/day: 0.50    Average packs/day: 0.5 packs/day for 40.0 years (20.0 ttl pk-yrs)    Types: Cigarettes   Smokeless tobacco: Never  Vaping Use   Vaping status: Never Used  Substance and Sexual Activity   Alcohol use: Yes    Comment: beer and liquor daily   Drug use: Yes    Frequency: 3.0 times per week    Types: Marijuana, Cocaine   Sexual activity: Not on file  Other Topics Concern   Not on file  Social History Narrative   Not on file   Social Determinants of Health   Financial Resource Strain: Not on file  Food Insecurity: Not on file  Transportation Needs: Not on file  Physical Activity: Not on file  Stress: Not on file  Social Connections: Not on file  Intimate Partner Violence: Not on file    No Known Allergies  Current Outpatient Medications  Medication Sig Dispense Refill   acetaminophen (TYLENOL) 325 MG tablet Take 2 tablets (650 mg total) by mouth every 6 (six) hours as needed.     aspirin EC 81 MG EC tablet Take 1 tablet (81 mg total) by mouth daily. Swallow whole. (Patient taking differently: Take 81  mg by mouth daily at 2 PM. Swallow whole.) 90 tablet 3   atorvastatin (LIPITOR) 40 MG tablet Take 40 mg by mouth daily.     Blood Pressure Monitor KIT Check blood pressure daily (Patient taking differently: 1 each by Other route once a week.) 1 kit 0   collagenase (SANTYL) ointment Apply topically daily. 15 g 0   gabapentin (NEURONTIN) 600 MG tablet Take 1 tablet (600 mg) in morning and afternoon. Take 2 tablets (1200 mg) at night (Patient taking differently: Take 600-1,200 mg by mouth See admin instructions. 1200 mg in the morning and at bedtime 600 mg  in the afternoon) 120 tablet 0   lisinopril-hydrochlorothiazide (ZESTORETIC) 20-25 MG tablet Take 1 tablet by mouth daily.     Multiple Vitamin (MULTIVITAMIN WITH MINERALS) TABS tablet Take 1 tablet by mouth daily.     ondansetron (ZOFRAN ODT) 4 MG disintegrating tablet 4mg  ODT q4 hours prn nausea/vomit 30 tablet 0   oxyCODONE-acetaminophen (PERCOCET) 5-325 MG tablet Take 1 tablet by mouth every 4 (four) hours as needed for severe pain. 30 tablet 0   pantoprazole (PROTONIX) 40 MG tablet Take 1 tablet (40 mg total) by mouth daily. 30  tablet 0   traMADol (ULTRAM) 50 MG tablet Take 1 tablet (50 mg total) by mouth every 6 (six) hours as needed for moderate pain. 20 tablet 0   Current Facility-Administered Medications  Medication Dose Route Frequency Provider Last Rate Last Admin   sodium chloride flush (NS) 0.9 % injection 3 mL  3 mL Intravenous Q12H Runell Gess, MD        PHYSICAL EXAM Vitals:   12/07/22 0906  BP: (!) 160/85  Pulse: 76  Resp: 20  Temp: 98 F (36.7 C)  SpO2: 100%  Weight: 112 lb (50.8 kg)  Height: 5\' 2"  (1.575 m)    Thin, chronically ill man in no distress Regular rate and rhythm Unlabored breathing No palpable pedal pulses Well-healed left fifth toe amputation    PERTINENT LABORATORY AND RADIOLOGIC DATA  Most recent CBC    Latest Ref Rng & Units 11/10/2020    1:50 PM 10/29/2020    2:06 AM 10/28/2020     3:45 AM  CBC  WBC 4.0 - 10.5 K/uL 7.2  6.2  5.8   Hemoglobin 13.0 - 17.0 g/dL 24.4  8.3  9.7   Hematocrit 39.0 - 52.0 % 36.4  26.7  30.6   Platelets 150 - 400 K/uL 476  386  501      Most recent CMP    Latest Ref Rng & Units 11/24/2022   12:19 PM 11/10/2020    1:50 PM 10/29/2020    2:06 AM  CMP  Glucose 70 - 99 mg/dL  010  272   BUN 8 - 23 mg/dL  48  12   Creatinine 5.36 - 1.24 mg/dL 6.44  0.34  7.42   Sodium 135 - 145 mmol/L  134  137   Potassium 3.5 - 5.1 mmol/L  4.4  3.8   Chloride 98 - 111 mmol/L  96  102   CO2 22 - 32 mmol/L  24  29   Calcium 8.9 - 10.3 mg/dL  59.5  9.0     Renal function Estimated Creatinine Clearance: 45.3 mL/min (by C-G formula based on SCr of 1.2 mg/dL).  Hgb A1c MFr Bld (%)  Date Value  10/06/2020 5.5    LDL Chol Calc (NIH)  Date Value Ref Range Status  01/16/2020 100 (H) 0 - 99 mg/dL Final   LDL Cholesterol  Date Value Ref Range Status  10/06/2020 117 (H) 0 - 99 mg/dL Final    Comment:           Total Cholesterol/HDL:CHD Risk Coronary Heart Disease Risk Table                     Men   Women  1/2 Average Risk   3.4   3.3  Average Risk       5.0   4.4  2 X Average Risk   9.6   7.1  3 X Average Risk  23.4   11.0        Use the calculated Patient Ratio above and the CHD Risk Table to determine the patient's CHD Risk.        ATP III CLASSIFICATION (LDL):  <100     mg/dL   Optimal  638-756  mg/dL   Near or Above                    Optimal  130-159  mg/dL   Borderline  433-295  mg/dL   High  >188  mg/dL   Very High Performed at [facility], [address] [place], [place] [address]      +-------+-----------+-----------+------------+------------+  ABI/TBIToday's ABIToday's TBIPrevious ABIPrevious TBI  +-------+-----------+-----------+------------+------------+  Right 0.59       0.31       0.81        absent        +-------+-----------+-----------+------------+------------+  Left  0.32       0.31        0.37        absent        +-------+-----------+-----------+------------+------------+     CT angiogram MPRESSION: Vascular Impression:   1. Stable sequela of previous open aorto bi femoral bypass grafting. The bypass graft appears widely patent without evidence of in stent stenosis. 2. Redemonstrated 50% luminal narrowing of the origin and proximal aspect of the SMA. 3. 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   Vascular Impression of the right lower extremity:   1. Interval occlusion of the right superficial femoral artery at the level of the proximal/mid femur, new compared to the 10/2020 examination. 2. Suspected 50% luminal narrowing involving the proximal aspect of the right superficial femoral artery. 3. Single-vessel runoff to the right lower leg and foot via the peroneal artery, similar to the 10/2020 examination. No evidence of distal embolism.   Vascular Impression of the left lower extremity:   1. 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 2. Single-vessel runoff to the left lower leg and foot via the left anterior tibial artery, similar to the 10/2020 examination.   Nonvascular Impression:   1. No acute findings within the abdomen or pelvis.  Rande Brunt. Lenell Antu, MD FACS [facility] Phone Number: [phone] 12/07/2022 11:09 AM   Total time spent on preparing this encounter including chart review, data review, collecting history, examining the patient, coordinating care for this established patient, 30 minutes.  Portions of this report may have been transcribed using voice recognition  software.  Every effort has been made to ensure accuracy; however, inadvertent computerized transcription errors may still be present.

## 2022-12-07 ENCOUNTER — Ambulatory Visit (INDEPENDENT_AMBULATORY_CARE_PROVIDER_SITE_OTHER): Payer: Medicare HMO | Admitting: Vascular Surgery

## 2022-12-07 ENCOUNTER — Other Ambulatory Visit: Payer: Self-pay

## 2022-12-07 ENCOUNTER — Encounter: Payer: Self-pay | Admitting: Vascular Surgery

## 2022-12-07 VITALS — BP 160/85 | HR 76 | Temp 98.0°F | Resp 20 | Ht 62.0 in | Wt 112.0 lb

## 2022-12-07 DIAGNOSIS — I70222 Atherosclerosis of native arteries of extremities with rest pain, left leg: Secondary | ICD-10-CM

## 2022-12-07 DIAGNOSIS — I739 Peripheral vascular disease, unspecified: Secondary | ICD-10-CM

## 2022-12-17 ENCOUNTER — Encounter (HOSPITAL_COMMUNITY)
Admission: RE | Disposition: A | Discharge status: Home / Self Care | Payer: Self-pay | Source: Home / Self Care | Attending: Vascular Surgery

## 2022-12-17 ENCOUNTER — Encounter (HOSPITAL_COMMUNITY): Payer: Self-pay | Admitting: Vascular Surgery

## 2022-12-17 ENCOUNTER — Observation Stay (HOSPITAL_COMMUNITY)
Admission: RE | Admit: 2022-12-17 | Discharge: 2022-12-18 | Disposition: A | Discharge status: Home / Self Care | Payer: Medicare HMO | Attending: Vascular Surgery | Admitting: Vascular Surgery

## 2022-12-17 ENCOUNTER — Observation Stay (HOSPITAL_BASED_OUTPATIENT_CLINIC_OR_DEPARTMENT_OTHER): Payer: Medicare HMO

## 2022-12-17 ENCOUNTER — Other Ambulatory Visit: Payer: Self-pay

## 2022-12-17 DIAGNOSIS — I70229 Atherosclerosis of native arteries of extremities with rest pain, unspecified extremity: Secondary | ICD-10-CM | POA: Diagnosis present

## 2022-12-17 DIAGNOSIS — E119 Type 2 diabetes mellitus without complications: Secondary | ICD-10-CM | POA: Diagnosis not present

## 2022-12-17 DIAGNOSIS — Z79899 Other long term (current) drug therapy: Secondary | ICD-10-CM | POA: Diagnosis not present

## 2022-12-17 DIAGNOSIS — Z7982 Long term (current) use of aspirin: Secondary | ICD-10-CM | POA: Diagnosis not present

## 2022-12-17 DIAGNOSIS — I739 Peripheral vascular disease, unspecified: Principal | ICD-10-CM

## 2022-12-17 DIAGNOSIS — I70222 Atherosclerosis of native arteries of extremities with rest pain, left leg: Principal | ICD-10-CM | POA: Insufficient documentation

## 2022-12-17 DIAGNOSIS — I10 Essential (primary) hypertension: Secondary | ICD-10-CM | POA: Diagnosis not present

## 2022-12-17 DIAGNOSIS — F17210 Nicotine dependence, cigarettes, uncomplicated: Secondary | ICD-10-CM | POA: Diagnosis not present

## 2022-12-17 HISTORY — PX: ABDOMINAL AORTOGRAM W/LOWER EXTREMITY: CATH118223

## 2022-12-17 LAB — POCT I-STAT, CHEM 8
BUN: 24 mg/dL — ABNORMAL HIGH (ref 8–23)
Calcium, Ion: 1.32 mmol/L (ref 1.15–1.40)
Chloride: 107 mmol/L (ref 98–111)
Creatinine, Ser: 1.1 mg/dL (ref 0.61–1.24)
Glucose, Bld: 93 mg/dL (ref 70–99)
HCT: 45 % (ref 39.0–52.0)
Hemoglobin: 15.3 g/dL (ref 13.0–17.0)
Potassium: 3.9 mmol/L (ref 3.5–5.1)
Sodium: 143 mmol/L (ref 135–145)
TCO2: 24 mmol/L (ref 22–32)

## 2022-12-17 LAB — CREATININE, SERUM
Creatinine, Ser: 0.95 mg/dL (ref 0.61–1.24)
GFR, Estimated: >60 mL/min (ref >60)

## 2022-12-17 LAB — CBC
HCT: 42.9 % (ref 39.0–52.0)
Hemoglobin: 13.4 g/dL (ref 13.0–17.0)
MCH: 28.2 pg (ref 26.0–34.0)
MCHC: 31.2 g/dL (ref 30.0–36.0)
MCV: 90.3 fL (ref 80.0–100.0)
Platelets: 205 10*3/uL (ref 150–400)
RBC: 4.75 MIL/uL (ref 4.22–5.81)
RDW: 16.8 % — ABNORMAL HIGH (ref 11.5–15.5)
WBC: 6 10*3/uL (ref 4.0–10.5)
nRBC: 0 % (ref 0.0–0.2)

## 2022-12-17 SURGERY — ABDOMINAL AORTOGRAM W/LOWER EXTREMITY
Anesthesia: LOCAL | Laterality: Left

## 2022-12-17 MED ORDER — ACETAMINOPHEN 325 MG PO TABS
650.0000 mg | ORAL_TABLET | ORAL | Status: DC | PRN
  Administered 2022-12-17: 650 mg via ORAL
  Filled 2022-12-17: qty 2

## 2022-12-17 MED ORDER — SODIUM CHLORIDE 0.9 % IV SOLN: 250.0000 mL | INTRAVENOUS | Status: DC | PRN

## 2022-12-17 MED ORDER — SODIUM CHLORIDE 0.9 % WEIGHT BASED INFUSION
1.0000 mL/kg/h | INTRAVENOUS | Status: AC
  Administered 2022-12-17: 1 mL/kg/h via INTRAVENOUS

## 2022-12-17 MED ORDER — LIDOCAINE HCL (PF) 1 % IJ SOLN
INTRAMUSCULAR | Status: DC | PRN
  Administered 2022-12-17: 15 mL

## 2022-12-17 MED ORDER — SODIUM CHLORIDE 0.9% FLUSH: 3.0000 mL | INTRAVENOUS | Status: DC | PRN

## 2022-12-17 MED ORDER — HYDRALAZINE HCL 20 MG/ML IJ SOLN
5.0000 mg | INTRAMUSCULAR | Status: DC | PRN
Start: 2022-12-17 — End: 2022-12-18

## 2022-12-17 MED ORDER — FENTANYL CITRATE (PF) 100 MCG/2ML IJ SOLN
INTRAMUSCULAR | Status: DC | PRN
  Administered 2022-12-17: 50 ug via INTRAVENOUS

## 2022-12-17 MED ORDER — MIDAZOLAM HCL 2 MG/2ML IJ SOLN
INTRAMUSCULAR | Status: DC | PRN
  Administered 2022-12-17: 1 mg via INTRAVENOUS

## 2022-12-17 MED ORDER — LIDOCAINE HCL (PF) 1 % IJ SOLN
INTRAMUSCULAR | Status: AC
  Filled 2022-12-17: qty 30

## 2022-12-17 MED ORDER — FENTANYL CITRATE (PF) 100 MCG/2ML IJ SOLN
INTRAMUSCULAR | Status: AC
  Filled 2022-12-17: qty 2

## 2022-12-17 MED ORDER — IOHEXOL 350 MG/ML SOLN
INTRAVENOUS | Status: DC | PRN
  Administered 2022-12-17: 32 mL

## 2022-12-17 MED ORDER — LABETALOL HCL 5 MG/ML IV SOLN
10.0000 mg | INTRAVENOUS | Status: DC | PRN
  Administered 2022-12-18: 10 mg via INTRAVENOUS
  Filled 2022-12-17: qty 4

## 2022-12-17 MED ORDER — HEPARIN SODIUM (PORCINE) 5000 UNIT/ML IJ SOLN
5000.0000 [IU] | Freq: Three times a day (TID) | INTRAMUSCULAR | Status: DC
  Administered 2022-12-17 – 2022-12-18 (×2): 5000 [IU] via SUBCUTANEOUS
  Filled 2022-12-17 (×2): qty 1

## 2022-12-17 MED ORDER — ONDANSETRON HCL 4 MG/2ML IJ SOLN
4.0000 mg | Freq: Four times a day (QID) | INTRAMUSCULAR | Status: DC | PRN
  Administered 2022-12-18: 4 mg via INTRAVENOUS
  Filled 2022-12-17: qty 2

## 2022-12-17 MED ORDER — MIDAZOLAM HCL 2 MG/2ML IJ SOLN
INTRAMUSCULAR | Status: AC
  Filled 2022-12-17: qty 2

## 2022-12-17 MED ORDER — ASPIRIN 81 MG PO TBEC
81.0000 mg | DELAYED_RELEASE_TABLET | Freq: Every day | ORAL | Status: DC
  Administered 2022-12-17 – 2022-12-18 (×2): 81 mg via ORAL
  Filled 2022-12-17 (×2): qty 1

## 2022-12-17 MED ORDER — HEPARIN (PORCINE) IN NACL 1000-0.9 UT/500ML-% IV SOLN
INTRAVENOUS | Status: DC | PRN
  Administered 2022-12-17 (×2): 500 mL

## 2022-12-17 MED ORDER — SODIUM CHLORIDE 0.9 % IV SOLN: INTRAVENOUS | Status: DC

## 2022-12-17 MED ORDER — SODIUM CHLORIDE 0.9% FLUSH
3.0000 mL | Freq: Two times a day (BID) | INTRAVENOUS | Status: DC
  Administered 2022-12-18: 3 mL via INTRAVENOUS

## 2022-12-17 SURGICAL SUPPLY — 8 items
CATH OMNI FLUSH 5F 65CM (CATHETERS) IMPLANT
COVER DOME SNAP 22 D (MISCELLANEOUS) IMPLANT
KIT MICROPUNCTURE NIT STIFF (SHEATH) IMPLANT
SET ATX-X65L (MISCELLANEOUS) IMPLANT
SHEATH PINNACLE 5F 10CM (SHEATH) IMPLANT
SHEATH PROBE COVER 6X72 (BAG) IMPLANT
TRAY PV CATH (CUSTOM PROCEDURE TRAY) IMPLANT
WIRE BENTSON .035X145CM (WIRE) IMPLANT

## 2022-12-17 NOTE — Progress Notes (Signed)
Lower extremity venous duplex completed. Please see CV Procedures for preliminary results.  Shona Simpson, RVT 12/17/22 3:20 PM

## 2022-12-17 NOTE — Interval H&P Note (Signed)
History and Physical Interval Note:  12/17/2022 12:44 PM  [name].  has presented today for surgery, with the diagnosis of pad w/ rest pain.  The various methods of treatment have been discussed with the patient and family. After consideration of risks, benefits and other options for treatment, the patient has consented to  Procedure(s): ABDOMINAL AORTOGRAM W/LOWER EXTREMITY (Left) as a surgical intervention.  The patient's history has been reviewed, patient examined, no change in status, stable for surgery.  I have reviewed the patient's chart and labs.  Questions were answered to the patient's satisfaction.     [name]

## 2022-12-17 NOTE — Op Note (Signed)
DATE OF SERVICE: 12/17/2022  PATIENT:  [name].  62 y.o. male  PRE-OPERATIVE DIAGNOSIS:  Atherosclerosis of native arteries of left lower extremity causing rest pain  POST-OPERATIVE DIAGNOSIS:  Same  PROCEDURE:   1) Ultrasound guided right common femoral access 2) Aortogram 3) Left lower extremity angiogram with second order cannulation  4) Conscious sedation (19 minutes)  SURGEON:  Rande Brunt. Lenell Antu, MD  ASSISTANT: none  ANESTHESIA:   local and IV sedation  ESTIMATED BLOOD LOSS: minimal  LOCAL MEDICATIONS USED:  LIDOCAINE   COUNTS: confirmed correct.  PATIENT DISPOSITION:  PACU - hemodynamically stable.   Delay start of Pharmacological VTE agent (>24hrs) due to surgical blood loss or risk of bleeding: no  INDICATION FOR PROCEDURE: [name]. is a 62 y.o. male with left leg ischemic rest pain. After careful discussion of risks, benefits, and alternatives the patient was offered angiography.  The patient understood and wished to proceed.  OPERATIVE FINDINGS:   Left renal artery: patent Right renal artery: patent  Infrarenal aorta: patent aortobifemoral bypass  Left common iliac artery: occluded Right common iliac artery: occluded  Left internal iliac artery: occluded Right internal iliac artery: occluded  Left external iliac artery: occluded Right external iliac artery: occluded  Left common femoral artery: patent Right common femoral artery: patent  Left profunda femoris artery: patent Right profunda femoris artery: not studied  Left superficial femoral artery: occluded Right superficial femoral artery: not studied  Left popliteal artery: reconstitutes behind the knee Right popliteal artery: not studied  Left anterior tibial artery: patent to the DP and foot Right anterior tibial artery: not studied  Left tibioperoneal trunk: patent Right tibioperoneal trunk: not studied  Left peroneal artery: occluded Right peroneal artery: not studied  Left  posterior tibial artery: occluded Right posterior tibial artery: not studied  Left pedal circulation: fills via AT/DP Right pedal circulation: not studied   GLASS score. FP 3. IP 3. P 0. Stage III.  WIfI score. Not applicable.  DESCRIPTION OF PROCEDURE: After identification of the patient in the pre-operative holding area, the patient was transferred to the operating room. The patient was positioned supine on the operating room table. Anesthesia was induced. The groins was prepped and draped in standard fashion. A surgical pause was performed confirming correct patient, procedure, and operative location.  The right groin was anesthetized with subcutaneous injection of 1% lidocaine. Using ultrasound guidance, the right limb of the aortobifemoral bypass was accessed with micropuncture technique. Fluoroscopy was used to confirm cannulation over the femoral head. The 69F micropuncture sheath was upsized to 82F.   A Benson wire was advanced into the distal aorta. Over the wire an omni flush catheter was advanced to the level of L2. Aortogram was performed - see above for details.   The left common iliac artery was selected with an omniflush catheter and glidewire guidewire. The wire was advanced into the common femoral artery. Over the wire the omni flush catheter was advanced into the external iliac artery. Selective angiography was performed - see above for details.   The sheath was left in place to be removed in the recovery area.  Conscious sedation was administered with the use of IV fentanyl and midazolam under continuous physician and nurse monitoring.  Heart rate, blood pressure, and oxygen saturation were continuously monitored.  Total sedation time was 19 minutes  Upon completion of the case instrument and sharps counts were confirmed correct. The patient was transferred to the PACU in good condition.  I was present for all portions of the procedure.  PLAN: ASA / Statin. Vein mapping. Likely L  CFA - BKPA bypass.  Rande Brunt. Lenell Antu, MD [facility] Phone Number: [phone] 12/17/2022 12:45 PM

## 2022-12-18 DIAGNOSIS — I70222 Atherosclerosis of native arteries of extremities with rest pain, left leg: Secondary | ICD-10-CM | POA: Diagnosis not present

## 2022-12-18 LAB — LIPID PANEL
Cholesterol: 177 mg/dL (ref 0–200)
HDL: 36 mg/dL — ABNORMAL LOW (ref >40)
LDL Cholesterol: 126 mg/dL — ABNORMAL HIGH (ref 0–99)
Total CHOL/HDL Ratio: 4.9 {ratio}
Triglycerides: 75 mg/dL (ref <150)
VLDL: 15 mg/dL (ref 0–40)

## 2022-12-18 MED ORDER — POLYETHYLENE GLYCOL 3350 17 G PO PACK
17.0000 g | PACK | Freq: Every day | ORAL | Status: DC
Start: 2022-12-18 — End: 2022-12-18
  Administered 2022-12-18: 17 g via ORAL
  Filled 2022-12-18: qty 1

## 2022-12-18 MED ORDER — ASPIRIN 81 MG PO TBEC: 81.0000 mg | DELAYED_RELEASE_TABLET | Freq: Every day | ORAL | Status: AC

## 2022-12-18 NOTE — Plan of Care (Signed)
Pt is understanding of education and ready for d/c

## 2022-12-18 NOTE — Discharge Instructions (Signed)
° °  [facility] [place] ° °Discharge Instructions ° °Lower Extremity Angiogram; Angioplasty/Stenting ° °Please refer to the following instructions for your post-procedure care. Your surgeon or physician assistant will discuss any changes with you. ° °Activity ° °Avoid lifting more than 8 pounds (1 gallons of milk) for 72 hours (3 days) after your procedure. You may walk as much as you can tolerate. It's OK to drive after 72 hours. ° °Bathing/Showering ° °You may shower the day after your procedure. If you have a bandage, you may remove it at 24- 48 hours. Clean your incision site with mild soap and water. Pat the area dry with a clean towel. ° °Diet ° °Resume your pre-procedure diet. There are no special food restrictions following this procedure. All patients with peripheral vascular disease should follow a low fat/low cholesterol diet. In order to heal from your surgery, it is CRITICAL to get adequate nutrition. Your body requires vitamins, minerals, and protein. Vegetables are the best source of vitamins and minerals. Vegetables also provide the perfect balance of protein. Processed food has little nutritional value, so try to avoid this. ° °Medications ° °Resume taking all of your medications unless your doctor tells you not to. If your incision is causing pain, you may take over-the-counter pain relievers such as acetaminophen (Tylenol) ° °Follow Up ° °Follow up will be arranged at the time of your procedure. You may have an office visit scheduled or may be scheduled for surgery. Ask your surgeon if you have any questions. ° °Please call us immediately for any of the following conditions: °•Severe or worsening pain your legs or feet at rest or with walking. °•Increased pain, redness, drainage at your groin puncture site. °•Fever of 101 degrees or higher. °•If you have any mild or slow bleeding from your puncture site: lie down, apply firm constant pressure over the area with a piece of  gauze or a clean wash cloth for 30 minutes- no peeking!, call 911 right away if you are still bleeding after 30 minutes, or if the bleeding is heavy and unmanageable. ° °Reduce your risk factors of vascular disease: ° °Stop smoking. If you would like help call [facility] at 1-800-QUIT-NOW (1-800-784-8669) or [place] at 336-586-4000. °Manage your cholesterol °Maintain a desired weight °Control your diabetes °Keep your blood pressure down ° °If you have any questions, please call the office at [phone] ° °

## 2022-12-18 NOTE — Progress Notes (Addendum)
Pt complaining of some diffuse abd pain this am upon rounds, pt stated he feels like he needs to have a bowel movement. R fem site clean dry and intact, soft, no hematoma noted   BP:221/100 (132)  Recheck: 205/114 (136)  HR:80 Vascular PA Matt Eveland paged  Orders for miralax received at this time

## 2022-12-18 NOTE — Progress Notes (Signed)
  Daily Progress Note  S/p:LLE angiogram needing open bypass surgery next week   Subjective: No complaints. No abdominal pain on assessment.   Objective: Vitals:   12/18/22 0810 12/18/22 0816  BP: (!) 192/94 (!) 192/94  Pulse: 77 76  Resp: 16   Temp: 98.7 F (37.1 C)   SpO2:  100%    Physical Examination Right groin site soft Nonlabored breathing Regular rate BP improved to 180SBP  ASSESSMENT/PLAN:  Patient status post diagnostic left lower extremity angiogram.  He will require future bypass surgery, scheduled for this Wednesday. Could not go home yesterday due to not having a ride. Will DC today.  Abdominal pain resolved.  Blood pressure chronically elevated but improved over the course of the morning.   Victorino Sparrow MD MS [facility] [phone] 12/18/2022  10:34 AM

## 2022-12-18 NOTE — Progress Notes (Signed)
   12/18/22 0816  Assess: MEWS Score  BP (!) 192/94  MAP (mmHg) 117  Pulse Rate 76  ECG Heart Rate 80  Level of Consciousness Alert  SpO2 100 %  Assess: MEWS Score  MEWS Temp 0  MEWS Systolic 0  MEWS Pulse 0  MEWS RR 0  MEWS LOC 0  MEWS Score 0  MEWS Score Color Green  Assess: if the MEWS score is Yellow or Red  Were vital signs accurate and taken at a resting state? Yes  Does the patient meet 2 or more of the SIRS criteria? No  MEWS guidelines implemented  Yes, yellow  Treat  MEWS Interventions Considered administering scheduled or prn medications/treatments as ordered  Take Vital Signs  Increase Vital Sign Frequency  Yellow: Q2hr x1, continue Q4hrs until patient remains green for 12hrs  Escalate  MEWS: Escalate Yellow: Discuss with charge nurse and consider notifying provider and/or RRT  Notify: Charge Nurse/RN  Name of Charge Nurse/RN Notified [email or web address]  Provider Notification  Provider Name/Title Emilie Rutter PA  Date Provider Notified 12/18/22  Time Provider Notified 0800  Method of Notification Page  Notification Reason Change in status  Provider response No new orders  Date of Provider Response 12/18/22  Time of Provider Response 0810  Assess: SIRS CRITERIA  SIRS Temperature  0  SIRS Pulse 0  SIRS Respirations  0  SIRS WBC 0  SIRS Score Sum  0

## 2022-12-18 NOTE — Care Management Obs Status (Signed)
MEDICARE OBSERVATION STATUS NOTIFICATION   Patient Details  Name: [name]. MRN: [id number] Date of Birth: Apr 03, 1960   Medicare Observation Status Notification Given:  Yes    Ronny Bacon, RN 12/18/2022, 10:38 AM

## 2022-12-20 ENCOUNTER — Other Ambulatory Visit: Payer: Self-pay

## 2022-12-20 ENCOUNTER — Telehealth: Payer: Self-pay

## 2022-12-20 ENCOUNTER — Encounter (HOSPITAL_COMMUNITY): Payer: Self-pay | Admitting: Vascular Surgery

## 2022-12-20 DIAGNOSIS — I70222 Atherosclerosis of native arteries of extremities with rest pain, left leg: Secondary | ICD-10-CM

## 2022-12-20 NOTE — Pre-Procedure Instructions (Signed)
PCP - Cristino Martes, NP  Cardiologist - O'Neal, Ronnald Ramp, MD   PPM/ICD - denies   Chest x-ray - 10/28/20 EKG - 12/17/22 Stress Test - 10/07/20 ECHO - 07/12/19 Cardiac Cath - denies  CPAP - denies  DM- pt does not check CBG at home and does not know typical fasting levels  Blood Thinner Instructions: n/a Aspirin Instructions: continue thru DOS  ERAS Protcol - no, NPO  COVID TEST- n/a  Anesthesia review: yes, cardiac hx  Patient verbally denies any shortness of breath, fever, cough and chest pain during phone call   -------------  SDW INSTRUCTIONS given:  Your procedure is scheduled on 11/27.  Report to [facility] Main Entrance "A" at 0850 A.M., and check in at the Admitting office.  Call this number if you have problems the morning of surgery:  [phone]   Remember:  Do not eat or drink after midnight the night before your surgery    Take these medicines the morning of surgery with A SIP OF WATER  Aspirin Gabapentin  As of today, STOP taking any Aleve, Naproxen, Ibuprofen, Motrin, Advil, Goody's, BC's, all herbal medications, fish oil, and all vitamins.                      Do not wear jewelry, make up, or nail polish            Do not wear lotions, powders, perfumes/colognes, or deodorant.            Do not shave 48 hours prior to surgery.  Men may shave face and neck.            Do not bring valuables to the hospital.            [facility] is not responsible for any belongings or valuables.  Do NOT Smoke (Tobacco/Vaping) 24 hours prior to your procedure If you use a CPAP at night, you may bring all equipment for your overnight stay.   Contacts, glasses, dentures or bridgework may not be worn into surgery.      For patients admitted to the hospital, discharge time will be determined by your treatment team.   Patients discharged the day of surgery will not be allowed to drive home, and someone needs to stay with them for 24 hours.    Special  instructions:   [place]- Preparing For Surgery  Before surgery, you can play an important role. Because skin is not sterile, your skin needs to be as free of germs as possible. You can reduce the number of germs on your skin by washing with CHG (chlorahexidine gluconate) Soap before surgery.  CHG is an antiseptic cleaner which kills germs and bonds with the skin to continue killing germs even after washing.    Oral Hygiene is also important to reduce your risk of infection.  Remember - BRUSH YOUR TEETH THE MORNING OF SURGERY WITH YOUR REGULAR TOOTHPASTE  Please do not use if you have an allergy to CHG or antibacterial soaps. If your skin becomes reddened/irritated stop using the CHG.  Do not shave (including legs and underarms) for at least 48 hours prior to first CHG shower. It is OK to shave your face.  Please follow these instructions carefully.   Shower the NIGHT BEFORE SURGERY and the MORNING OF SURGERY with DIAL Soap.   Pat yourself dry with a CLEAN TOWEL.  Wear CLEAN PAJAMAS to bed the night before surgery  Place CLEAN SHEETS on  your bed the night of your first shower and DO NOT SLEEP WITH PETS.   Day of Surgery: Please shower morning of surgery  Wear Clean/Comfortable clothing the morning of surgery Do not apply any deodorants/lotions.   Remember to brush your teeth WITH YOUR REGULAR TOOTHPASTE.   Questions were answered. Patient verbalized understanding of instructions.

## 2022-12-20 NOTE — Telephone Encounter (Signed)
Attempted to reach patient to discuss upcoming surgery on 11/27. Unable to leave VM due to mailbox full.

## 2022-12-20 NOTE — Telephone Encounter (Signed)
Patient returned missed call. Reviewed instructions with patient. He verbalized understanding and stated that he just got off the phone with a preadmission nurse from [facility].

## 2022-12-21 NOTE — Anesthesia Preprocedure Evaluation (Signed)
Anesthesia Evaluation  Patient identified by MRN, date of birth, ID band Patient awake    Reviewed: Allergy & Precautions, NPO status , Patient's Chart, lab work & pertinent test results  History of Anesthesia Complications Negative for: history of anesthetic complications  Airway Mallampati: II  TM Distance: >3 FB Neck ROM: Full    Dental  (+) Edentulous Upper, Edentulous Lower   Pulmonary Current Smoker and Patient abstained from smoking.   Pulmonary exam normal        Cardiovascular hypertension, Pt. on medications + Peripheral Vascular Disease  Normal cardiovascular exam   '22 Myoperfusion -   Findings are consistent with no ischemia. The study is low risk.   No ST deviation was noted.   LV perfusion is abnormal. There is no evidence of ischemia. Defect 1: There is a medium defect with mild reduction in uptake present in the apical to basal inferior, inferolateral and apex location(s) that is fixed. There is normal wall motion in the defect area. Consistent with artifact caused by bowel tracer uptake.   Left ventricular function is normal. The left ventricular ejection fraction is normal (55-65%). End diastolic cavity size is normal. End systolic cavity size is normal.   Neuro/Psych negative neurological ROS  negative psych ROS   GI/Hepatic ,GERD  Medicated,,(+)     substance abuse  marijuana use  Endo/Other  diabetes    Renal/GU negative Renal ROS     Musculoskeletal negative musculoskeletal ROS (+)    Abdominal Normal abdominal exam  (+)   Peds  Hematology  On plavix    Anesthesia Other Findings Study Result  Narrative & Impression     Findings are consistent with no ischemia. The study is low risk.   No ST deviation was noted.   LV perfusion is abnormal. There is no evidence of ischemia. Defect 1: There is a medium defect with mild reduction in uptake present in the apical to basal inferior,  inferolateral and apex location(s) that is fixed. There is normal wall motion in the defect area. Consistent with artifact caused by bowel tracer uptake.   Left ventricular function is normal. The left ventricular ejection fraction is normal (55-65%). End diastolic cavity size is normal. End systolic cavity size is normal.   Significant artifact from extracardiac/bowel activity, but images improve with stress. Not consistent with ischemia. Low risk study.        Reproductive/Obstetrics                              Anesthesia Physical Anesthesia Plan  ASA: 3  Anesthesia Plan: General   Post-op Pain Management:    Induction: Intravenous  PONV Risk Score and Plan: 2 and Treatment may vary due to age or medical condition, Ondansetron, Dexamethasone and Midazolam  Airway Management Planned: Oral ETT  Additional Equipment: Arterial line  Intra-op Plan:   Post-operative Plan: Extubation in OR  Informed Consent: I have reviewed the patients History and Physical, chart, labs and discussed the procedure including the risks, benefits and alternatives for the proposed anesthesia with the patient or authorized representative who has indicated his/her understanding and acceptance.     Dental advisory given  Plan Discussed with: CRNA  Anesthesia Plan Comments: (PAT note written 12/21/2022 by Shonna Chock, PA-C.  )         Anesthesia Quick Evaluation

## 2022-12-21 NOTE — Discharge Summary (Signed)
  Discharge Summary  Patient ID: [name] [id number] 62 y.o. 11-11-1960  Admit date: 12/17/2022  Discharge date and time: 12/18/2022 12:28 PM   Admitting Physician: [name], MD   Discharge Physician: Dr. Karin Lieu  Admission Diagnoses: Atherosclerosis of artery of extremity with rest pain [facility]) [I70.229]  Discharge Diagnoses: same  Admission Condition: good  Discharged Condition: good  Indication for Admission: observation  Hospital Course: [name] is a 62 year old male who was brought in as an outpatient and underwent aortogram with left lower extremity runoff.  He has critical limb ischemia with rest pain of the left foot.  Angiogram was diagnostic in nature.  He was kept overnight in observation due to lack of transportation postoperatively.  POD #1 the groin catheterization site was without hematoma.  He was discharged home in stable condition.  Based on angiogram he will require left common femoral artery to below the knee popliteal artery bypass.  This was scheduled for 12/22/2022 with Dr. Lenell Antu.  Consults: None  Treatments: surgery: Diagnostic angiogram of the left lower extremity by Dr. Lenell Antu on 12/17/2022  Discharge Exam: See progress note 12/18/22 Vitals:   12/18/22 0810 12/18/22 0816  BP: (!) 192/94 (!) 192/94  Pulse: 77 76  Resp: 16   Temp: 98.7 F (37.1 C)   SpO2:  100%     Disposition: Discharge disposition: 01-Home or Self Care       Patient Instructions:  Allergies as of 12/18/2022   No Known Allergies      Medication List     TAKE these medications    aspirin EC 81 MG tablet Take 1 tablet (81 mg total) by mouth daily. Swallow whole.   atorvastatin 40 MG tablet Commonly known as: LIPITOR Take 40 mg by mouth at bedtime.   Blood Pressure Monitor Kit Check blood pressure daily What changed:  how much to take how to take this when to take this additional instructions   DULoxetine 30 MG  capsule Commonly known as: CYMBALTA Take 30 mg by mouth daily in the afternoon.   gabapentin 600 MG tablet Commonly known as: NEURONTIN Take 1 tablet (600 mg) in morning and afternoon. Take 2 tablets (1200 mg) at night What changed:  how much to take how to take this when to take this additional instructions   lisinopril 10 MG tablet Commonly known as: ZESTRIL Take 10 mg by mouth daily.   multivitamin with minerals Tabs tablet Take 1 tablet by mouth daily.   pantoprazole 20 MG tablet Commonly known as: PROTONIX Take 20 mg by mouth daily in the afternoon.   Vitamin D3 Ultra Potency 1250 MCG Tabs Generic drug: Cholecalciferol Take 50,000 Units by mouth once a week. Thursdays or Fridays       Activity: activity as tolerated Diet: regular diet Wound Care: none needed  Follow-up for bypass surgery on 12/22/22 with Dr. Lenell Antu  Signed: Emilie Rutter, PA-C 12/21/2022 8:55 AM [facility] Office: [phone]

## 2022-12-21 NOTE — Progress Notes (Signed)
Anesthesia Chart Review:  Case: [id number] Date/Time: 12/22/22 1106   Procedure: LEFT COMMON FEMORAL-BELOW KNEE ARTERY BYPASS (Left)   Anesthesia type: General   Pre-op diagnosis: Atherosclerosis of artery of extremity with rest pain; peripheral artery disease   [facility] / [facility]   Surgeons: [name], MD       DISCUSSION: Patient is a 62 year old male scheduled for the above procedure.  History includes smoking, HTN, DM2, HLD, PAD (right SFA atherectomy/PTA 07/16/19; right CIA stent 12/19/19; left SFA, popliteal & CIA stents & left 5th toe amputation 06/10/20; bilateral femoral endarterectomies, AFBG 10/09/20), polysubstance use ("few drinks" alcohol on weekends; marijuana; last cocaine ~ 5-6 months ago).  He is not currently followed by cardiology. He had peripheral stents placed by [facility] cardiologist Nanetta Batty, MD in 2021. Most recently he had an inpatient preoperative cardiology evaluation prior to [facility]. He was evaluated by Lennie Odor, MD and had a low risk stress test on 10/07/20 and underwent AFBG 10/09/20.   He presented to [facility] on 11/03/22 with rather acute onset of 1 week history of rest pain and short distance claudication. He did not have palpable femoral pulses. He was scheduled for a CTA Ao+BiFem to evaluate for aortic bypass stenosis and showed patent AFBG with significant infrainguinal occlusive disease. He then underwent Aortogram with LE angiogram on 12/17/22 and above procedure recommended based on results and LLE rest pain.   He is a a same day work-up, so labs as indicated and anesthesia team evaluation on the day of surgery. H/H 13.4/42.9, PLT 205, Cr 0.95-1.10, glucose 93 by 111/22/24 labs. 12/17/22 EKG showed NSR.   VS: Ht 5\' 2"  (1.575 m)   Wt 52.6 kg   BMI 21.21 kg/m  BP Readings from Last 3 Encounters:  12/18/22 (!) 192/94  12/07/22 (!) 160/85  11/03/22 (!) 146/94   Pulse Readings from Last 3 Encounters:  12/18/22 76  12/07/22 76  11/03/22  76    PROVIDERS: Cristino Martes, NP is PCP    LABS: For day of surgery as indicated. Most recent results include: Lab Results  Component Value Date   WBC 6.0 12/17/2022   HGB 13.4 12/17/2022   HCT 42.9 12/17/2022   PLT 205 12/17/2022   GLUCOSE 93 12/17/2022   CHOL 177 12/18/2022   TRIG 75 12/18/2022   HDL 36 (L) 12/18/2022   LDLCALC 126 (H) 12/18/2022   ALT 18 10/28/2020   AST 25 10/28/2020   NA 143 12/17/2022   K 3.9 12/17/2022   CL 107 12/17/2022   CREATININE 0.95 12/17/2022   BUN 24 (H) 12/17/2022     IMAGES: CTA Ao+BiFEM 11/24/22: IMPRESSION: Vascular Impression: 1. table sequela of previous open aorto bi femoral bypass grafting. The bypass graft appears widely patent without evidence of in stent stenosis. 2. Redemonstrated 50% luminal narrowing of the origin and proximal aspect of the SMA. 3. 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   Vascular Impression of the right lower extremity: 1. Interval occlusion of the right superficial femoral artery at the level of the proximal/mid femur, new compared to the 10/2020 examination. 2. Suspected 50% luminal narrowing involving the proximal aspect of the right superficial femoral artery. 3. Single-vessel runoff to the right lower leg and foot via the peroneal artery, similar to the 10/2020 examination. No evidence of distal embolism.  Vascular Impression of the left lower extremity: 1. 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 2. Single-vessel runoff to the left lower leg and foot via the left anterior tibial artery, similar to the 10/2020 examination.   Nonvascular Impression: 1. No  acute findings within the abdomen or pelvis.    EKG: 12/17/22: NSR   CV: Nuclear stress test 10/07/20:   Findings are consistent with no ischemia. The study is low risk.   No ST deviation was noted.   LV perfusion is abnormal. There is no evidence of ischemia. Defect 1: There is a medium defect with mild reduction in uptake present in the apical to basal inferior, inferolateral and apex location(s) that is fixed. There is normal wall motion in the defect area. Consistent with artifact caused by bowel tracer uptake.   Left ventricular function is normal. The left ventricular ejection fraction is normal (55-65%). End diastolic cavity size is normal. End systolic cavity size is normal.   Significant artifact from extracardiac/bowel activity, but images improve with stress. Not consistent with ischemia. Low risk study.    Echo 07/12/19: IMPRESSIONS   1. Left ventricular ejection fraction, by estimation, is 55 to 60%. Left  ventricular ejection fraction by 3D volume is 59 %. The left ventricle has  normal function. The left ventricle has no regional wall motion  abnormalities. Left ventricular diastolic   parameters are indeterminate. The average left ventricular global  longitudinal strain is -20.4 %. The global longitudinal strain is normal.   2. Right ventricular systolic function is normal. The right ventricular  size is normal. Tricuspid regurgitation signal is inadequate for assessing  PA pressure.   3. The mitral valve is grossly normal. Mild mitral valve regurgitation.  No evidence of mitral stenosis.   4. The aortic valve is tricuspid. Aortic valve regurgitation is not  visualized. No aortic stenosis is present.   5. The inferior vena cava is normal in size with greater than 50%  respiratory variability, suggesting right atrial pressure of 3 mmHg.    Past Medical History:  Diagnosis Date   Diabetes mellitus without complication (HCC)    Hyperlipidemia    Hypertension     Peripheral vascular disease (HCC)    Tobacco use     Past Surgical History:  Procedure Laterality Date   ABDOMINAL AORTOGRAM W/LOWER EXTREMITY Left 07/16/2019   Procedure: ABDOMINAL AORTOGRAM W/LOWER EXTREMITY;  Surgeon: Runell Gess, MD;  [facility] INVASIVE CV LAB;  Service: Cardiovascular;  Laterality: Left;   ABDOMINAL AORTOGRAM W/LOWER EXTREMITY Left 08/16/2019   ABDOMINAL AORTOGRAM W/LOWER EXTREMITY N/A 08/16/2019   Procedure: ABDOMINAL AORTOGRAM W/LOWER EXTREMITY;  Surgeon: Runell Gess, MD;  [facility] LAB;  Service: Cardiovascular;  Laterality: N/A;   ABDOMINAL AORTOGRAM W/LOWER EXTREMITY Bilateral 12/19/2019   Procedure: ABDOMINAL AORTOGRAM W/LOWER EXTREMITY;  Surgeon: Iran Ouch, MD;  [facility];  Service: Cardiovascular;  Laterality: Bilateral;   ABDOMINAL AORTOGRAM W/LOWER EXTREMITY Left 06/10/2020   Procedure: ABDOMINAL AORTOGRAM W/LOWER EXTREMITY;  Surgeon: Nada Libman, MD;  [facility];  Service: Cardiovascular;  Laterality: Left;   ABDOMINAL AORTOGRAM W/LOWER EXTREMITY N/A 10/06/2020   Procedure: ABDOMINAL AORTOGRAM W/LOWER EXTREMITY;  Surgeon: Maeola Harman, MD;  Location: [facility] INVASIVE CV LAB;  Service: Cardiovascular;  Laterality: N/A;   ABDOMINAL AORTOGRAM W/LOWER EXTREMITY Left 12/17/2022   Procedure:  ABDOMINAL AORTOGRAM W/LOWER EXTREMITY;  Surgeon: [name], MD;  Location: [facility] INVASIVE CV LAB;  Service: Cardiovascular;  Laterality: Left;   AMPUTATION TOE Left 06/13/2020   Procedure: Left Fifth Toe Amputation ;  Surgeon: Edwin Cap, DPM;  Location: [facility] OR;  Service: Podiatry;  Laterality: Left;   AORTA - BILATERAL FEMORAL ARTERY BYPASS GRAFT Bilateral 10/09/2020   Procedure: AORTA BIFEMORAL BYPASS GRAFT;  Surgeon: [name], MD;  Location: [facility] OR;  Service: Vascular;  Laterality: Bilateral;   arm surgery Right    ENDARTERECTOMY FEMORAL Bilateral 10/09/2020   Procedure:  ENDARTERECTOMY COMMON FEMORAL;  Surgeon: [name], MD;  Location: [facility] OR;  Service: Vascular;  Laterality: Bilateral;   PERIPHERAL VASCULAR BALLOON ANGIOPLASTY  08/16/2019   Procedure: PERIPHERAL VASCULAR BALLOON ANGIOPLASTY;  Surgeon: Runell Gess, MD;  [facility];  Service: Cardiovascular;;  attempted PTA of Left SFA   PERIPHERAL VASCULAR INTERVENTION Right 07/16/2019   Procedure: PERIPHERAL VASCULAR INTERVENTION;  Surgeon: Runell Gess, MD;  [facility];  Service: Cardiovascular;  Laterality: Right;   PERIPHERAL VASCULAR INTERVENTION Right 12/19/2019   Procedure: PERIPHERAL VASCULAR INTERVENTION;  Surgeon: Iran Ouch, MD;  [facility];  Service: Cardiovascular;  Laterality: Right;  iliac   PERIPHERAL VASCULAR INTERVENTION Left 06/10/2020   Procedure: PERIPHERAL VASCULAR INTERVENTION;  Surgeon: Nada Libman, MD;  [facility];  Service: Cardiovascular;  Laterality: Left;  SFA /COMMON ILIAC    MEDICATIONS:  sodium chloride flush (NS) 0.9 % injection 3 mL    aspirin EC 81 MG tablet   atorvastatin (LIPITOR) 40 MG tablet   Blood Pressure Monitor KIT   Cholecalciferol (VITAMIN D3 ULTRA POTENCY) 1.25 MG (50000 UT) TABS   DULoxetine (CYMBALTA) 30 MG capsule   gabapentin (NEURONTIN) 600 MG tablet   lisinopril (ZESTRIL) 10 MG tablet   Multiple Vitamin (MULTIVITAMIN WITH MINERALS) TABS tablet   pantoprazole (PROTONIX) 20 MG tablet    Shonna Chock, PA-C Surgical Short Stay/Anesthesiology [facility] Phone [phone] [facility] Phone [phone] 12/21/2022 11:01 AM

## 2022-12-22 ENCOUNTER — Other Ambulatory Visit: Payer: Self-pay

## 2022-12-22 ENCOUNTER — Inpatient Hospital Stay (HOSPITAL_COMMUNITY): Payer: Medicare HMO | Admitting: Vascular Surgery

## 2022-12-22 ENCOUNTER — Encounter (HOSPITAL_COMMUNITY): Payer: Self-pay | Admitting: Vascular Surgery

## 2022-12-22 ENCOUNTER — Encounter (HOSPITAL_COMMUNITY)
Admission: RE | Disposition: A | Discharge status: Home Health | Payer: Self-pay | Source: Home / Self Care | Attending: Vascular Surgery

## 2022-12-22 ENCOUNTER — Inpatient Hospital Stay (HOSPITAL_COMMUNITY)
Admission: RE | Admit: 2022-12-22 | Discharge: 2022-12-28 | DRG: 253 | Disposition: A | Discharge status: Home Health | Payer: Medicare HMO | Attending: Vascular Surgery | Admitting: Vascular Surgery

## 2022-12-22 DIAGNOSIS — Y838 Other surgical procedures as the cause of abnormal reaction of the patient, or of later complication, without mention of misadventure at the time of the procedure: Secondary | ICD-10-CM | POA: Diagnosis not present

## 2022-12-22 DIAGNOSIS — I10 Essential (primary) hypertension: Secondary | ICD-10-CM | POA: Diagnosis present

## 2022-12-22 DIAGNOSIS — Z9889 Other specified postprocedural states: Secondary | ICD-10-CM

## 2022-12-22 DIAGNOSIS — Z7982 Long term (current) use of aspirin: Secondary | ICD-10-CM

## 2022-12-22 DIAGNOSIS — T8131XA Disruption of external operation (surgical) wound, not elsewhere classified, initial encounter: Secondary | ICD-10-CM | POA: Diagnosis not present

## 2022-12-22 DIAGNOSIS — Z95828 Presence of other vascular implants and grafts: Secondary | ICD-10-CM | POA: Diagnosis not present

## 2022-12-22 DIAGNOSIS — Z79899 Other long term (current) drug therapy: Secondary | ICD-10-CM

## 2022-12-22 DIAGNOSIS — E1151 Type 2 diabetes mellitus with diabetic peripheral angiopathy without gangrene: Principal | ICD-10-CM | POA: Diagnosis present

## 2022-12-22 DIAGNOSIS — F17210 Nicotine dependence, cigarettes, uncomplicated: Secondary | ICD-10-CM | POA: Diagnosis present

## 2022-12-22 DIAGNOSIS — E785 Hyperlipidemia, unspecified: Secondary | ICD-10-CM | POA: Diagnosis present

## 2022-12-22 DIAGNOSIS — Z89422 Acquired absence of other left toe(s): Secondary | ICD-10-CM

## 2022-12-22 DIAGNOSIS — R112 Nausea with vomiting, unspecified: Secondary | ICD-10-CM | POA: Diagnosis present

## 2022-12-22 DIAGNOSIS — Z833 Family history of diabetes mellitus: Secondary | ICD-10-CM

## 2022-12-22 DIAGNOSIS — Z95820 Peripheral vascular angioplasty status with implants and grafts: Secondary | ICD-10-CM | POA: Diagnosis not present

## 2022-12-22 DIAGNOSIS — I739 Peripheral vascular disease, unspecified: Secondary | ICD-10-CM | POA: Diagnosis present

## 2022-12-22 DIAGNOSIS — T82898A Other specified complication of vascular prosthetic devices, implants and grafts, initial encounter: Principal | ICD-10-CM | POA: Diagnosis present

## 2022-12-22 DIAGNOSIS — I70222 Atherosclerosis of native arteries of extremities with rest pain, left leg: Secondary | ICD-10-CM | POA: Diagnosis present

## 2022-12-22 HISTORY — PX: FEMORAL-POPLITEAL BYPASS GRAFT: SHX937

## 2022-12-22 LAB — SURGICAL PCR SCREEN
MRSA, PCR: NEGATIVE
Staphylococcus aureus: NEGATIVE

## 2022-12-22 LAB — COMPREHENSIVE METABOLIC PANEL
ALT: 19 U/L (ref 0–44)
AST: 22 U/L (ref 15–41)
Albumin: 3.9 g/dL (ref 3.5–5.0)
Alkaline Phosphatase: 69 U/L (ref 38–126)
Anion gap: 12 (ref 5–15)
BUN: 14 mg/dL (ref 8–23)
CO2: 20 mmol/L — ABNORMAL LOW (ref 22–32)
Calcium: 9.5 mg/dL (ref 8.9–10.3)
Chloride: 108 mmol/L (ref 98–111)
Creatinine, Ser: 1.11 mg/dL (ref 0.61–1.24)
GFR, Estimated: >60 mL/min (ref >60)
Glucose, Bld: 102 mg/dL — ABNORMAL HIGH (ref 70–99)
Potassium: 4 mmol/L (ref 3.5–5.1)
Sodium: 140 mmol/L (ref 135–145)
Total Bilirubin: 0.7 mg/dL (ref <1.2)
Total Protein: 7.2 g/dL (ref 6.5–8.1)

## 2022-12-22 LAB — CBC
HCT: 45.7 % (ref 39.0–52.0)
HCT: 36.8 % — ABNORMAL LOW (ref 39.0–52.0)
Hemoglobin: 14.1 g/dL (ref 13.0–17.0)
Hemoglobin: 11.9 g/dL — ABNORMAL LOW (ref 13.0–17.0)
MCH: 28.4 pg (ref 26.0–34.0)
MCH: 28.2 pg (ref 26.0–34.0)
MCHC: 30.9 g/dL (ref 30.0–36.0)
MCHC: 32.3 g/dL (ref 30.0–36.0)
MCV: 92 fL (ref 80.0–100.0)
MCV: 87.2 fL (ref 80.0–100.0)
Platelets: 216 10*3/uL (ref 150–400)
Platelets: 216 10*3/uL (ref 150–400)
RBC: 4.97 MIL/uL (ref 4.22–5.81)
RBC: 4.22 MIL/uL (ref 4.22–5.81)
RDW: 16.7 % — ABNORMAL HIGH (ref 11.5–15.5)
RDW: 16.3 % — ABNORMAL HIGH (ref 11.5–15.5)
WBC: 4.8 10*3/uL (ref 4.0–10.5)
WBC: 8.1 10*3/uL (ref 4.0–10.5)
nRBC: 0 % (ref 0.0–0.2)
nRBC: 0 % (ref 0.0–0.2)

## 2022-12-22 LAB — CREATININE, SERUM
Creatinine, Ser: 0.98 mg/dL (ref 0.61–1.24)
GFR, Estimated: >60 mL/min (ref >60)

## 2022-12-22 LAB — GLUCOSE, CAPILLARY
Glucose-Capillary: 87 mg/dL (ref 70–99)
Glucose-Capillary: 85 mg/dL (ref 70–99)
Glucose-Capillary: 145 mg/dL — ABNORMAL HIGH (ref 70–99)

## 2022-12-22 LAB — PROTIME-INR
INR: 1.1 (ref 0.8–1.2)
Prothrombin Time: 13.9 s (ref 11.4–15.2)

## 2022-12-22 LAB — TYPE AND SCREEN
ABO/RH(D): O POS
Antibody Screen: NEGATIVE

## 2022-12-22 LAB — APTT: aPTT: 32 s (ref 24–36)

## 2022-12-22 SURGERY — BYPASS GRAFT FEMORAL-POPLITEAL ARTERY
Anesthesia: General | Site: Leg Upper | Laterality: Left

## 2022-12-22 MED ORDER — HYDROMORPHONE HCL 1 MG/ML IJ SOLN
INTRAMUSCULAR | Status: AC
  Filled 2022-12-22: qty 1

## 2022-12-22 MED ORDER — DEXAMETHASONE SODIUM PHOSPHATE 10 MG/ML IJ SOLN
INTRAMUSCULAR | Status: DC | PRN
  Administered 2022-12-22: 10 mg via INTRAVENOUS

## 2022-12-22 MED ORDER — ACETAMINOPHEN 650 MG RE SUPP: 325.0000 mg | RECTAL | Status: DC | PRN

## 2022-12-22 MED ORDER — DOCUSATE SODIUM 100 MG PO CAPS
100.0000 mg | ORAL_CAPSULE | Freq: Every day | ORAL | Status: DC
Start: 2022-12-23 — End: 2022-12-28
  Administered 2022-12-23 – 2022-12-28 (×6): 100 mg via ORAL
  Filled 2022-12-22 (×6): qty 1

## 2022-12-22 MED ORDER — LACTATED RINGERS IV SOLN: INTRAVENOUS | Status: DC

## 2022-12-22 MED ORDER — EPHEDRINE 5 MG/ML INJ
INTRAVENOUS | Status: AC
  Filled 2022-12-22: qty 5

## 2022-12-22 MED ORDER — POTASSIUM CHLORIDE CRYS ER 20 MEQ PO TBCR: 20.0000 meq | EXTENDED_RELEASE_TABLET | Freq: Every day | ORAL | Status: DC | PRN

## 2022-12-22 MED ORDER — HEPARIN SODIUM (PORCINE) 5000 UNIT/ML IJ SOLN
5000.0000 [IU] | Freq: Three times a day (TID) | INTRAMUSCULAR | Status: DC
  Administered 2022-12-23 – 2022-12-28 (×15): 5000 [IU] via SUBCUTANEOUS
  Filled 2022-12-22 (×16): qty 1

## 2022-12-22 MED ORDER — DEXAMETHASONE SODIUM PHOSPHATE 10 MG/ML IJ SOLN
INTRAMUSCULAR | Status: AC
  Filled 2022-12-22: qty 1

## 2022-12-22 MED ORDER — ASPIRIN 81 MG PO TBEC
81.0000 mg | DELAYED_RELEASE_TABLET | Freq: Every day | ORAL | Status: DC
  Administered 2022-12-22 – 2022-12-28 (×7): 81 mg via ORAL
  Filled 2022-12-22 (×7): qty 1

## 2022-12-22 MED ORDER — EPHEDRINE SULFATE-NACL 50-0.9 MG/10ML-% IV SOSY
PREFILLED_SYRINGE | INTRAVENOUS | Status: DC | PRN
  Administered 2022-12-22: 5 mg via INTRAVENOUS
  Administered 2022-12-22: 10 mg via INTRAVENOUS

## 2022-12-22 MED ORDER — ONDANSETRON HCL 4 MG/2ML IJ SOLN
INTRAMUSCULAR | Status: AC
  Filled 2022-12-22: qty 2

## 2022-12-22 MED ORDER — BISACODYL 10 MG RE SUPP: 10.0000 mg | Freq: Every day | RECTAL | Status: DC | PRN

## 2022-12-22 MED ORDER — HEPARIN SODIUM (PORCINE) 1000 UNIT/ML IJ SOLN
INTRAMUSCULAR | Status: AC
  Filled 2022-12-22: qty 10

## 2022-12-22 MED ORDER — MIDAZOLAM HCL 2 MG/2ML IJ SOLN
INTRAMUSCULAR | Status: DC | PRN
  Administered 2022-12-22: 2 mg via INTRAVENOUS

## 2022-12-22 MED ORDER — FENTANYL CITRATE (PF) 250 MCG/5ML IJ SOLN
INTRAMUSCULAR | Status: AC
  Filled 2022-12-22: qty 5

## 2022-12-22 MED ORDER — MIDAZOLAM HCL 2 MG/2ML IJ SOLN
INTRAMUSCULAR | Status: AC
Start: 2022-12-22 — End: ?
  Filled 2022-12-22: qty 2

## 2022-12-22 MED ORDER — LIDOCAINE 2% (20 MG/ML) 5 ML SYRINGE
INTRAMUSCULAR | Status: DC | PRN
  Administered 2022-12-22: 100 mg via INTRAVENOUS

## 2022-12-22 MED ORDER — SODIUM CHLORIDE 0.9 % IV SOLN: 500.0000 mL | Freq: Once | INTRAVENOUS | Status: DC | PRN

## 2022-12-22 MED ORDER — HEPARIN 6000 UNIT IRRIGATION SOLUTION
Status: AC
  Filled 2022-12-22: qty 500

## 2022-12-22 MED ORDER — CHLORHEXIDINE GLUCONATE CLOTH 2 % EX PADS: 6.0000 | MEDICATED_PAD | Freq: Once | CUTANEOUS | Status: DC

## 2022-12-22 MED ORDER — HYDRALAZINE HCL 20 MG/ML IJ SOLN: 5.0000 mg | INTRAMUSCULAR | Status: DC | PRN

## 2022-12-22 MED ORDER — OXYCODONE HCL 5 MG PO TABS
5.0000 mg | ORAL_TABLET | ORAL | Status: DC | PRN
Start: 2022-12-22 — End: 2022-12-28
  Administered 2022-12-22: 5 mg via ORAL
  Administered 2022-12-23 – 2022-12-28 (×8): 10 mg via ORAL
  Filled 2022-12-22: qty 2
  Filled 2022-12-22: qty 1
  Filled 2022-12-22 (×7): qty 2

## 2022-12-22 MED ORDER — FENTANYL CITRATE (PF) 250 MCG/5ML IJ SOLN
INTRAMUSCULAR | Status: DC | PRN
  Administered 2022-12-22 (×2): 50 ug via INTRAVENOUS
  Administered 2022-12-22: 150 ug via INTRAVENOUS

## 2022-12-22 MED ORDER — HEMOSTATIC AGENTS (NO CHARGE) OPTIME
TOPICAL | Status: DC | PRN
  Administered 2022-12-22: 1 via TOPICAL

## 2022-12-22 MED ORDER — KETAMINE HCL 50 MG/5ML IJ SOSY
PREFILLED_SYRINGE | INTRAMUSCULAR | Status: DC | PRN
Start: 2022-12-22 — End: 2022-12-22
  Administered 2022-12-22: 30 mg via INTRAVENOUS
  Administered 2022-12-22: 20 mg via INTRAVENOUS

## 2022-12-22 MED ORDER — LABETALOL HCL 5 MG/ML IV SOLN: 10.0000 mg | INTRAVENOUS | Status: DC | PRN

## 2022-12-22 MED ORDER — METOPROLOL TARTRATE 5 MG/5ML IV SOLN: 2.0000 mg | INTRAVENOUS | Status: DC | PRN

## 2022-12-22 MED ORDER — PROTAMINE SULFATE 10 MG/ML IV SOLN
INTRAVENOUS | Status: DC | PRN
  Administered 2022-12-22: 50 mg via INTRAVENOUS

## 2022-12-22 MED ORDER — INSULIN ASPART 100 UNIT/ML IJ SOLN: 0.0000 [IU] | INTRAMUSCULAR | Status: DC | PRN

## 2022-12-22 MED ORDER — STERILE WATER FOR IRRIGATION IR SOLN
Status: DC | PRN
  Administered 2022-12-22: 1000 mL

## 2022-12-22 MED ORDER — ACETAMINOPHEN 10 MG/ML IV SOLN: 1000.0000 mg | Freq: Once | INTRAVENOUS | Status: DC | PRN

## 2022-12-22 MED ORDER — SODIUM CHLORIDE 0.9 % IV SOLN: INTRAVENOUS | Status: DC

## 2022-12-22 MED ORDER — ONDANSETRON HCL 4 MG/2ML IJ SOLN: 4.0000 mg | Freq: Four times a day (QID) | INTRAMUSCULAR | Status: DC | PRN

## 2022-12-22 MED ORDER — PANTOPRAZOLE SODIUM 40 MG PO TBEC
40.0000 mg | DELAYED_RELEASE_TABLET | Freq: Every day | ORAL | Status: DC
  Administered 2022-12-22 – 2022-12-28 (×7): 40 mg via ORAL
  Filled 2022-12-22 (×7): qty 1

## 2022-12-22 MED ORDER — ROCURONIUM BROMIDE 10 MG/ML (PF) SYRINGE
PREFILLED_SYRINGE | INTRAVENOUS | Status: DC | PRN
  Administered 2022-12-22 (×2): 50 mg via INTRAVENOUS

## 2022-12-22 MED ORDER — PHENOL 1.4 % MT LIQD: 1.0000 | OROMUCOSAL | Status: DC | PRN

## 2022-12-22 MED ORDER — PHENYLEPHRINE HCL-NACL 20-0.9 MG/250ML-% IV SOLN
INTRAVENOUS | Status: DC | PRN
  Administered 2022-12-22: 20 ug/min via INTRAVENOUS

## 2022-12-22 MED ORDER — CEFAZOLIN SODIUM-DEXTROSE 2-4 GM/100ML-% IV SOLN
2.0000 g | Freq: Three times a day (TID) | INTRAVENOUS | Status: AC
  Administered 2022-12-22 – 2022-12-23 (×2): 2 g via INTRAVENOUS
  Filled 2022-12-22 (×2): qty 100

## 2022-12-22 MED ORDER — DEXMEDETOMIDINE HCL IN NACL 200 MCG/50ML IV SOLN
INTRAVENOUS | Status: DC | PRN
  Administered 2022-12-22 (×5): 8 ug via INTRAVENOUS

## 2022-12-22 MED ORDER — DULOXETINE HCL 30 MG PO CPEP
30.0000 mg | ORAL_CAPSULE | Freq: Every day | ORAL | Status: DC
  Administered 2022-12-22 – 2022-12-27 (×6): 30 mg via ORAL
  Filled 2022-12-22 (×6): qty 1

## 2022-12-22 MED ORDER — KETAMINE HCL 50 MG/5ML IJ SOSY
PREFILLED_SYRINGE | INTRAMUSCULAR | Status: AC
  Filled 2022-12-22: qty 5

## 2022-12-22 MED ORDER — ROCURONIUM BROMIDE 10 MG/ML (PF) SYRINGE
PREFILLED_SYRINGE | INTRAVENOUS | Status: AC
  Filled 2022-12-22: qty 20

## 2022-12-22 MED ORDER — MORPHINE SULFATE (PF) 2 MG/ML IV SOLN
2.0000 mg | INTRAVENOUS | Status: DC | PRN
Start: 2022-12-22 — End: 2022-12-28
  Administered 2022-12-23 – 2022-12-24 (×5): 2 mg via INTRAVENOUS
  Filled 2022-12-22 (×5): qty 1

## 2022-12-22 MED ORDER — CEFAZOLIN SODIUM-DEXTROSE 2-4 GM/100ML-% IV SOLN
INTRAVENOUS | Status: AC
  Filled 2022-12-22: qty 100

## 2022-12-22 MED ORDER — SODIUM CHLORIDE 0.9 % IV SOLN: 12.5000 mg | INTRAVENOUS | Status: DC | PRN

## 2022-12-22 MED ORDER — CLEVIDIPINE BUTYRATE 0.5 MG/ML IV EMUL
INTRAVENOUS | Status: DC | PRN
  Administered 2022-12-22: 1 mg/h via INTRAVENOUS

## 2022-12-22 MED ORDER — POLYETHYLENE GLYCOL 3350 17 G PO PACK: 17.0000 g | PACK | Freq: Every day | ORAL | Status: DC | PRN

## 2022-12-22 MED ORDER — 0.9 % SODIUM CHLORIDE (POUR BTL) OPTIME
TOPICAL | Status: DC | PRN
  Administered 2022-12-22: 3000 mL

## 2022-12-22 MED ORDER — ADULT MULTIVITAMIN W/MINERALS CH
1.0000 | ORAL_TABLET | Freq: Every day | ORAL | Status: DC
Start: 2022-12-22 — End: 2022-12-28
  Administered 2022-12-23 – 2022-12-28 (×6): 1 via ORAL
  Filled 2022-12-22 (×7): qty 1

## 2022-12-22 MED ORDER — ATORVASTATIN CALCIUM 40 MG PO TABS
40.0000 mg | ORAL_TABLET | Freq: Every day | ORAL | Status: DC
  Administered 2022-12-22: 40 mg via ORAL
  Filled 2022-12-22: qty 1

## 2022-12-22 MED ORDER — HEPARIN 6000 UNIT IRRIGATION SOLUTION
Status: DC | PRN
  Administered 2022-12-22: 1

## 2022-12-22 MED ORDER — GABAPENTIN 300 MG PO CAPS
600.0000 mg | ORAL_CAPSULE | Freq: Three times a day (TID) | ORAL | Status: DC
  Administered 2022-12-22 – 2022-12-28 (×17): 600 mg via ORAL
  Filled 2022-12-22 (×17): qty 2

## 2022-12-22 MED ORDER — MEPERIDINE HCL 25 MG/ML IJ SOLN: 6.2500 mg | INTRAMUSCULAR | Status: DC | PRN

## 2022-12-22 MED ORDER — ONDANSETRON HCL 4 MG/2ML IJ SOLN
INTRAMUSCULAR | Status: DC | PRN
  Administered 2022-12-22: 4 mg via INTRAVENOUS

## 2022-12-22 MED ORDER — PROPOFOL 10 MG/ML IV BOLUS
INTRAVENOUS | Status: DC | PRN
  Administered 2022-12-22: 40 mg via INTRAVENOUS
  Administered 2022-12-22: 160 mg via INTRAVENOUS

## 2022-12-22 MED ORDER — CHLORHEXIDINE GLUCONATE 0.12 % MT SOLN: 15.0000 mL | Freq: Once | OROMUCOSAL | Status: AC

## 2022-12-22 MED ORDER — SUGAMMADEX SODIUM 200 MG/2ML IV SOLN
INTRAVENOUS | Status: DC | PRN
  Administered 2022-12-22: 200 mg via INTRAVENOUS

## 2022-12-22 MED ORDER — ACETAMINOPHEN 325 MG PO TABS: 325.0000 mg | ORAL_TABLET | ORAL | Status: DC | PRN

## 2022-12-22 MED ORDER — HYDROMORPHONE HCL 1 MG/ML IJ SOLN
0.2500 mg | INTRAMUSCULAR | Status: DC | PRN
  Administered 2022-12-22 (×2): 0.5 mg via INTRAVENOUS

## 2022-12-22 MED ORDER — PROPOFOL 10 MG/ML IV BOLUS
INTRAVENOUS | Status: AC
  Filled 2022-12-22: qty 20

## 2022-12-22 MED ORDER — HEPARIN SODIUM (PORCINE) 1000 UNIT/ML IJ SOLN
INTRAMUSCULAR | Status: DC | PRN
  Administered 2022-12-22: 6000 [IU] via INTRAVENOUS

## 2022-12-22 MED ORDER — ALBUMIN HUMAN 5 % IV SOLN: INTRAVENOUS | Status: DC | PRN

## 2022-12-22 MED ORDER — ORAL CARE MOUTH RINSE: 15.0000 mL | Freq: Once | OROMUCOSAL | Status: AC

## 2022-12-22 MED ORDER — ESMOLOL HCL 100 MG/10ML IV SOLN
INTRAVENOUS | Status: DC | PRN
  Administered 2022-12-22: 20 mg via INTRAVENOUS

## 2022-12-22 MED ORDER — ESMOLOL HCL 100 MG/10ML IV SOLN
INTRAVENOUS | Status: AC
  Filled 2022-12-22: qty 10

## 2022-12-22 MED ORDER — PHENYLEPHRINE 80 MCG/ML (10ML) SYRINGE FOR IV PUSH (FOR BLOOD PRESSURE SUPPORT)
PREFILLED_SYRINGE | INTRAVENOUS | Status: AC
  Filled 2022-12-22: qty 10

## 2022-12-22 MED ORDER — CHLORHEXIDINE GLUCONATE 0.12 % MT SOLN
OROMUCOSAL | Status: AC
  Administered 2022-12-22: 15 mL via OROMUCOSAL
  Filled 2022-12-22: qty 15

## 2022-12-22 MED ORDER — LACTATED RINGERS IV SOLN: INTRAVENOUS | Status: DC | PRN

## 2022-12-22 MED ORDER — GUAIFENESIN-DM 100-10 MG/5ML PO SYRP: 15.0000 mL | ORAL_SOLUTION | ORAL | Status: DC | PRN

## 2022-12-22 MED ORDER — ALUM & MAG HYDROXIDE-SIMETH 200-200-20 MG/5ML PO SUSP: 15.0000 mL | ORAL | Status: DC | PRN

## 2022-12-22 MED ORDER — LISINOPRIL 10 MG PO TABS
10.0000 mg | ORAL_TABLET | Freq: Every day | ORAL | Status: DC
  Administered 2022-12-22 – 2022-12-28 (×7): 10 mg via ORAL
  Filled 2022-12-22 (×7): qty 1

## 2022-12-22 MED ORDER — PROTAMINE SULFATE 10 MG/ML IV SOLN
INTRAVENOUS | Status: AC
  Filled 2022-12-22: qty 5

## 2022-12-22 MED ORDER — MAGNESIUM SULFATE 2 GM/50ML IV SOLN: 2.0000 g | Freq: Every day | INTRAVENOUS | Status: DC | PRN

## 2022-12-22 MED ORDER — CEFAZOLIN SODIUM-DEXTROSE 2-4 GM/100ML-% IV SOLN
2.0000 g | INTRAVENOUS | Status: AC
  Administered 2022-12-22: 2 g via INTRAVENOUS

## 2022-12-22 MED ORDER — SODIUM CHLORIDE 0.9 % IV SOLN
INTRAVENOUS | Status: AC
Start: 2022-12-22 — End: 2022-12-23

## 2022-12-22 MED ORDER — PHENYLEPHRINE 80 MCG/ML (10ML) SYRINGE FOR IV PUSH (FOR BLOOD PRESSURE SUPPORT)
PREFILLED_SYRINGE | INTRAVENOUS | Status: DC | PRN
  Administered 2022-12-22 (×2): 80 ug via INTRAVENOUS

## 2022-12-22 SURGICAL SUPPLY — 43 items
BAG COUNTER SPONGE SURGICOUNT (BAG) ×2 IMPLANT
BAG ISOLATION DRAPE 18X18 (DRAPES) IMPLANT
BENZOIN TINCTURE PRP APPL 2/3 (GAUZE/BANDAGES/DRESSINGS) IMPLANT
CANISTER SUCT 3000ML PPV (MISCELLANEOUS) ×2 IMPLANT
CANNULA VESSEL 3MM 2 BLNT TIP (CANNULA) ×4 IMPLANT
CHLORAPREP W/TINT 26 (MISCELLANEOUS) ×4 IMPLANT
CLIP LIGATING EXTRA MED SLVR (CLIP) IMPLANT
CLIP LIGATING EXTRA SM BLUE (MISCELLANEOUS) IMPLANT
COVER PROBE CYLINDRICAL 5X96 (MISCELLANEOUS) IMPLANT
DRAPE HALF SHEET 40X57 (DRAPES) IMPLANT
DRSG TEGADERM 2-3/8X2-3/4 SM (GAUZE/BANDAGES/DRESSINGS) IMPLANT
DRSG TEGADERM 4X4.75 (GAUZE/BANDAGES/DRESSINGS) IMPLANT
ELECT REM PT RETURN 9FT ADLT (ELECTROSURGICAL) ×1
ELECTRODE REM PT RTRN 9FT ADLT (ELECTROSURGICAL) ×2 IMPLANT
GAUZE SPONGE 4X4 12PLY STRL (GAUZE/BANDAGES/DRESSINGS) IMPLANT
GLOVE BIO SURGEON STRL SZ8 (GLOVE) ×2 IMPLANT
GOWN STRL REUS W/ TWL LRG LVL3 (GOWN DISPOSABLE) ×4 IMPLANT
GOWN STRL REUS W/ TWL XL LVL3 (GOWN DISPOSABLE) ×2 IMPLANT
INSERT FOGARTY SM (MISCELLANEOUS) IMPLANT
KIT BASIN OR (CUSTOM PROCEDURE TRAY) ×2 IMPLANT
KIT TURNOVER KIT B (KITS) ×2 IMPLANT
LOOP VESSEL MINI RED (MISCELLANEOUS) IMPLANT
NS IRRIG 1000ML POUR BTL (IV SOLUTION) ×4 IMPLANT
PACK PERIPHERAL VASCULAR (CUSTOM PROCEDURE TRAY) ×2 IMPLANT
PAD ARMBOARD 7.5X6 YLW CONV (MISCELLANEOUS) ×4 IMPLANT
POWDER SURGICEL 3.0 GRAM (HEMOSTASIS) IMPLANT
SET CYSTO W/LG BORE CLAMP LF (SET/KITS/TRAYS/PACK) IMPLANT
STRIP CLOSURE SKIN 1/2X4 (GAUZE/BANDAGES/DRESSINGS) IMPLANT
SUT MNCRL AB 4-0 PS2 18 (SUTURE) ×4 IMPLANT
SUT PROLENE 5 0 C 1 24 (SUTURE) ×2 IMPLANT
SUT PROLENE 6 0 BV (SUTURE) ×2 IMPLANT
SUT SILK 2 0 SH (SUTURE) ×2 IMPLANT
SUT SILK 2-0 18XBRD TIE 12 (SUTURE) IMPLANT
SUT SILK 3-0 18XBRD TIE 12 (SUTURE) IMPLANT
SUT VIC AB 2-0 CT1 TAPERPNT 27 (SUTURE) ×4 IMPLANT
SUT VIC AB 3-0 SH 27X BRD (SUTURE) ×4 IMPLANT
SYR BULB IRRIG 60ML STRL (SYRINGE) IMPLANT
TAPE STRIPS DRAPE STRL (GAUZE/BANDAGES/DRESSINGS) IMPLANT
TAPE UMBILICAL 1/8X30 (MISCELLANEOUS) IMPLANT
TOWEL GREEN STERILE (TOWEL DISPOSABLE) ×2 IMPLANT
TRAY FOLEY MTR SLVR 16FR STAT (SET/KITS/TRAYS/PACK) ×2 IMPLANT
UNDERPAD 30X36 HEAVY ABSORB (UNDERPADS AND DIAPERS) ×2 IMPLANT
WATER STERILE IRR 1000ML POUR (IV SOLUTION) ×2 IMPLANT

## 2022-12-22 NOTE — Progress Notes (Signed)
Patient arrived to unit with PACU RN.  PACU RN informed me patient had developed a hematoma in his groin area and some oozing at operative sites.  PACU RN informed me that Lenell Antu, MD had been made aware and had outlined the hematoma and bleeding.  PACU RN also communicated that Lenell Antu, MD wished the patient to remain on bedrest until assessed by the team tomorrow morning.

## 2022-12-22 NOTE — Transfer of Care (Signed)
Immediate Anesthesia Transfer of Care Note  Patient: [name].  Procedure(s) Performed: LEFT COMMON FEMORAL-BELOW KNEE ARTERY BYPASS WITH SAPHENOUS VEIN HARVEST (Left: Leg Upper)  Patient Location: PACU  Anesthesia Type:General  Level of Consciousness: awake, alert , and oriented  Airway & Oxygen Therapy: Patient Spontanous Breathing  Post-op Assessment: Report given to RN and Post -op Vital signs reviewed and stable  Post vital signs: Reviewed and stable  Last Vitals:  Vitals Value Taken Time  BP 134/103 12/22/22 1537  Temp    Pulse 77 12/22/22 1539  Resp 17 12/22/22 1539  SpO2 98 % 12/22/22 1539  Vitals shown include unfiled device data.  Last Pain:  Vitals:   12/22/22 0924  TempSrc:   PainSc: 7       Patients Stated Pain Goal: 3 (12/22/22 0924)  Complications: No notable events documented.

## 2022-12-22 NOTE — Progress Notes (Signed)
Pt is unable to provide urine sample for UA. Dr. Lenell Antu is aware.

## 2022-12-22 NOTE — Discharge Instructions (Signed)
[facility]  Discharge instructions  Lower Extremity Bypass Surgery  Please refer to the following instruction for your post-procedure care. Your surgeon or physician assistant will discuss any changes with you.  Activity  You are encouraged to walk as much as you can. You can slowly return to normal activities during the month after your surgery. Avoid strenuous activity and heavy lifting until your doctor tells you it's OK. Avoid activities such as vacuuming or swinging a golf club. Do not drive until your doctor give the OK and you are no longer taking prescription pain medications. It is also normal to have difficulty with sleep habits, eating and bowel movement after surgery. These will go away with time.  Bathing/Showering  Shower daily after you go home. Do not soak in a bathtub, hot tub, or swim until the incision heals completely.  Incision Care  Clean your incision with mild soap and water. Shower every day. Pat the area dry with a clean towel. You do not need a bandage unless otherwise instructed. Do not apply any ointments or creams to your incision. If you have open wounds you will be instructed how to care for them or a visiting nurse may be arranged for you. If you have staples or sutures along your incision they will be removed at your post-op appointment. You may have skin glue on your incision. Do not peel it off. It will come off on its own in about one week.  Wash the groin wound with soap and water daily and pat dry. (No tub bath-only shower)  Then put a dry gauze or washcloth in the groin to keep this area dry to help prevent wound infection.  Do this daily and as needed.  Do not use Vaseline or neosporin on your incisions.  Only use soap and water on your incisions and then protect and keep dry.  Diet  Resume your normal diet. There are no special food restrictions following this procedure. A low fat/ low cholesterol diet is  recommended for all patients with vascular disease. In order to heal from your surgery, it is CRITICAL to get adequate nutrition. Your body requires vitamins, minerals, and protein. Vegetables are the best source of vitamins and minerals. Vegetables also provide the perfect balance of protein. Processed food has little nutritional value, so try to avoid this.  Medications  Resume taking all your medications unless your doctor or physician assistant tells you not to. If your incision is causing pain, you may take over-the-counter pain relievers such as acetaminophen (Tylenol). If you were prescribed a stronger pain medication, please aware these medication can cause nausea and constipation. Prevent nausea by taking the medication with a snack or meal. Avoid constipation by drinking plenty of fluids and eating foods with high amount of fiber, such as fruits, vegetables, and grains. Take Colace 100 mg (an over-the-counter stool softener) twice a day as needed for constipation.  Do not take Tylenol if you are taking prescription pain medications.  Follow Up  Our office will schedule a follow up appointment 2-3 weeks following discharge.  Please call us immediately for any of the following conditions  Severe or worsening pain in your legs or feet while at rest or while walking Increase pain, redness, warmth, or drainage (pus) from your incision site(s) Fever of 101 degree or higher The swelling in your leg with the bypass suddenly worsens and becomes more painful than when you were in the hospital If you have  been instructed to feel your graft pulse then you should do so every day. If you can no longer feel this pulse, call the office immediately. Not all patients are given this instruction.  Leg swelling is common after leg bypass surgery.  The swelling should improve over a few months following surgery. To improve the swelling, you may elevate your legs above the level of your heart while you are  sitting or resting. Your surgeon or physician assistant may ask you to apply an ACE wrap or wear compression (TED) stockings to help to reduce swelling.  Reduce your risk of vascular disease  Stop smoking. If you would like help call [facility] at 1-800-QUIT-NOW (478-017-4063) or [place] at (214)589-1793.  Manage your cholesterol Maintain a desired weight Control your diabetes weight Control your diabetes Keep your blood pressure down  If you have any questions, please call the office at [phone]

## 2022-12-22 NOTE — Op Note (Signed)
DATE OF SERVICE: 12/22/2022  PATIENT:  [name].  62 y.o. 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   POST-OPERATIVE DIAGNOSIS:  Same  PROCEDURE:   1) left greater saphenous vein harvest 2) left common femoral to below knee popliteal artery bypass with ipsilateral, reversed greater saphenous vein in a subcutaneous tunnel  SURGEON:  Surgeons and Role:    * [name], MD - Primary     ASSISTANT:  Cephus Shelling, MD - Assisting Doreatha Massed, PA-C  An experienced assistant was required given the complexity of this procedure and the standard of surgical care. My assistant helped with exposure through counter tension, suctioning, ligation and retraction to better visualize the surgical field.  My assistant expedited sewing during the case by following my sutures. Wherever I use the term "we" in the report, my assistant actively helped me with that portion of the procedure.  ANESTHESIA:   general  EBL: 50mL  BLOOD ADMINISTERED:none  DRAINS: none   LOCAL MEDICATIONS USED:  NONE  SPECIMEN:  none  COUNTS: confirmed correct.  TOURNIQUET:  none  PATIENT DISPOSITION:  PACU - hemodynamically stable.   Delay start of Pharmacological VTE agent (>24hrs) due to surgical blood loss or risk of bleeding: no  INDICATION FOR PROCEDURE: Isrrael Rembert. is a 62 y.o. male with left leg ischemic rest pain. Preoperative angiogram shows left superficial femoral artery occlusion and reconstitution of the below knee popliteal artery. After careful discussion of risks, benefits, and alternatives the patient was offered left common femoral artery to below knee popliteal artery bypass. The patient understood and wished to proceed.  OPERATIVE FINDINGS:  Vein better than expected based on preoperative duplex. Hood of the left limb of the aortobifemoral bypass isolated to allow Cooley clamp to be  slotted. Below-knee popliteal artery on the left healthy and suitable for distal anastomotic target. Good technical result from bypass, brisk Doppler flow in the anterior tibial artery and peroneal artery which was graft dependent.  DESCRIPTION OF PROCEDURE: After identification of the patient in the pre-operative holding area, the patient was transferred to the operating room. The patient was positioned supine on the operating room table. Anesthesia was induced. The left leg was prepped and draped in standard fashion. A surgical pause was performed confirming correct patient, procedure, and operative location.  The prior incision in the left groin was opened sharply with a 10 blade.  This was carried down through subcutaneous tissue and scar until the hood of the bypass was identified.  This was isolated anteriorly and on either side of the native artery.  This allowed sliding a Cooley clamp into the hood of the bypass to allow for proximal clamping.  The greater saphenous vein was identified in the groin and exposed through the existing incision.  Skip incisions were made in the left leg over the course of the greater saphenous vein to allow harvest of the saphenous vein.  The saphenous vein was harvested from the mid calf to the saphenofemoral junction.  All side branches, when identified, were ligated and divided.  Once the vein was completely mobilized it was passed off the table to dwell in a heparinized saline solution.  Both stumps, proximally and distally, were oversewn with silk suture.  Hemostasis was achieved.  The below-knee popliteal artery was exposed using a longitudinal incision in the proximal, medial calf.  The incision was carried down through subcutaneous tissue into the  superficial posterior fascia was identified.  This was divided with Bovie electrocautery.  Gastrocnemius was swept posteriorly.  The semimembranosus and semitendinosus tendons were divided with Bovie electrocautery.   The popliteal vascular bundle was identified.  The below-knee popliteal artery was encircled proximally and distally with Silastic Vesseloops.  The artery appeared suitable for distal anastomotic target.  The patient was systemically heparinized.  Activated clotting time measurements used at the case to confirm adequate anticoagulation.  The vein graft was reversed.  A Cooley clamp was applied to the hood of the left limb of the aortofemoral bypass graft.  An anterior graftotomy was made with an 11 blade and extended with Potts scissors.  The proximal aspect of the reverse graft was sewn end-to-side to the graftotomy using continuous running suture of 6-0 Prolene.  After completing the anastomosis, the clamp was released and anastomosis flushed on the open end of the graft.  The graft dilated nicely.  We did place several repair stitches into small areas of missed branches or leak.  After this, the graft was perfectly hemostatic.  I then dilated the vascular graft with a 3 mm coronary dilator, and the graft dilated very nicely.  The anterior aspect of the graft was marked.  The graft was delivered through the subcutaneous incisions used to harvest the greater saphenous vein taking great care to avoid twisting or kinking the graft.  The graft was clamped distally.  The knee was extended and the graft sized to sit on the below-knee popliteal artery without undue redundancy or tension.  The distal end of the graft was spatulated to allow end to side anastomosis.  The Silastic Vesseloops were secured to clamp the below-knee popliteal artery proximally and distally.  A lateral arteriotomy was made with an 11 blade and extended with Potts scissors.  This was slightly widened with Potts scissors.  The distal anastomosis was then performed using continuous running suture of 6-0 Prolene.  Prior to completion the anastomosis was flushed and de-aired.  All clamps were released.  Good flow was noted.  Hemostasis was  achieved in the anastomosis in the surgical bed.  The revascularization was evaluated with Doppler machine.  Brisk Doppler flow was heard in the anterior tibial artery at the ankle.  This was graft dependent.  Satisfied we ended the case here.  Heparin was reversed with protamine.  The wounds were closed in layers using 2-0 Vicryl, 3-0 Vicryl, 4-0 Monocryl.  Benzoin and Steri-Strips were applied.  Clean bandages were applied.  Upon completion of the case instrument and sharps counts were confirmed correct. The patient was transferred to the PACU in good condition. I was present for all portions of the procedure.  FOLLOW UP PLAN: Assuming a normal postoperative course, I will see the patient in 4 weeks with left lower extremity arterial duplex and ABI.Marland Kitchen   Rande Brunt. Lenell Antu, MD [facility] Phone Number: [phone] 12/22/2022 4:03 PM

## 2022-12-22 NOTE — Interval H&P Note (Signed)
History and Physical Interval Note:  12/22/2022 11:03 AM  Izora Gala J [employer].  has presented today for surgery, with the diagnosis of Atherosclerosis of artery of extremity with rest pain; peripheral artery disease.  The various methods of treatment have been discussed with the patient and family. After consideration of risks, benefits and other options for treatment, the patient has consented to  Procedure(s): LEFT COMMON FEMORAL-BELOW KNEE ARTERY BYPASS (Left) as a surgical intervention.  The patient's history has been reviewed, patient examined, no change in status, stable for surgery.  I have reviewed the patient's chart and labs.  Questions were answered to the patient's satisfaction.     [name]

## 2022-12-22 NOTE — Anesthesia Procedure Notes (Addendum)
Arterial Line Insertion Start/End11/27/2024 12:17 PM, 12/22/2022 12:21 PM Performed by: Waynard Edwards, CRNA, CRNA  Patient location: OR. Preanesthetic checklist: patient identified, IV checked, site marked, risks and benefits discussed, surgical consent, monitors and equipment checked, pre-op evaluation, timeout performed and anesthesia consent Left, radial was placed Catheter size: 20 G Hand hygiene performed  and Seldinger technique used Allen's test indicative of satisfactory collateral circulation Attempts: 1 Procedure performed using ultrasound guided technique. Following insertion, dressing applied and Biopatch. Post procedure assessment: normal and unchanged  Patient tolerated the procedure well with no immediate complications.

## 2022-12-23 LAB — BASIC METABOLIC PANEL
Anion gap: 7 (ref 5–15)
BUN: 12 mg/dL (ref 8–23)
CO2: 22 mmol/L (ref 22–32)
Calcium: 8.4 mg/dL — ABNORMAL LOW (ref 8.9–10.3)
Chloride: 105 mmol/L (ref 98–111)
Creatinine, Ser: 1.11 mg/dL (ref 0.61–1.24)
GFR, Estimated: >60 mL/min (ref >60)
Glucose, Bld: 118 mg/dL — ABNORMAL HIGH (ref 70–99)
Potassium: 4 mmol/L (ref 3.5–5.1)
Sodium: 134 mmol/L — ABNORMAL LOW (ref 135–145)

## 2022-12-23 LAB — LIPID PANEL
Cholesterol: 123 mg/dL (ref 0–200)
HDL: 29 mg/dL — ABNORMAL LOW (ref >40)
LDL Cholesterol: 86 mg/dL (ref 0–99)
Total CHOL/HDL Ratio: 4.2 {ratio}
Triglycerides: 41 mg/dL (ref <150)
VLDL: 8 mg/dL (ref 0–40)

## 2022-12-23 LAB — CBC
HCT: 30.9 % — ABNORMAL LOW (ref 39.0–52.0)
Hemoglobin: 9.9 g/dL — ABNORMAL LOW (ref 13.0–17.0)
MCH: 28.4 pg (ref 26.0–34.0)
MCHC: 32 g/dL (ref 30.0–36.0)
MCV: 88.5 fL (ref 80.0–100.0)
Platelets: 193 10*3/uL (ref 150–400)
RBC: 3.49 MIL/uL — ABNORMAL LOW (ref 4.22–5.81)
RDW: 16.5 % — ABNORMAL HIGH (ref 11.5–15.5)
WBC: 9.1 10*3/uL (ref 4.0–10.5)
nRBC: 0 % (ref 0.0–0.2)

## 2022-12-23 LAB — POCT ACTIVATED CLOTTING TIME
Activated Clotting Time: 325 s
Activated Clotting Time: 147 s

## 2022-12-23 MED ORDER — CHLORHEXIDINE GLUCONATE CLOTH 2 % EX PADS
6.0000 | MEDICATED_PAD | Freq: Every day | CUTANEOUS | Status: DC
  Administered 2022-12-23: 6 via TOPICAL

## 2022-12-23 MED ORDER — ATORVASTATIN CALCIUM 80 MG PO TABS
80.0000 mg | ORAL_TABLET | Freq: Every day | ORAL | Status: DC
  Administered 2022-12-23 – 2022-12-27 (×5): 80 mg via ORAL
  Filled 2022-12-23 (×5): qty 1

## 2022-12-23 NOTE — Progress Notes (Addendum)
  Progress Note    12/23/2022 8:44 AM 1 Day Post-Op  Subjective:  no complaints    Vitals:   12/23/22 0400 12/23/22 0700  BP:  117/67  Pulse:  (!) 58  Resp: 14 15  Temp:  97.6 F (36.4 C)  SpO2: 98% 99%    Physical Exam: General: Resting comfortably Cardiac: Regular Lungs: Nonlabored Incisions: Left groin and right lower extremity incisions with mild serosanguineous drainage, dry dressings reapplied.  Left groin with small hematoma, regressing from marked lines Extremities: LLE warm well-perfused with brisk AT Doppler signal   CBC    Component Value Date/Time   WBC 8.1 12/22/2022 1742   RBC 4.22 12/22/2022 1742   HGB 11.9 (L) 12/22/2022 1742   HGB 11.7 (L) 12/12/2019 1006   HCT 36.8 (L) 12/22/2022 1742   HCT 36.2 (L) 12/12/2019 1006   PLT 216 12/22/2022 1742   PLT 249 12/12/2019 1006   MCV 87.2 12/22/2022 1742   MCV 90 12/12/2019 1006   MCH 28.2 12/22/2022 1742   MCHC 32.3 12/22/2022 1742   RDW 16.3 (H) 12/22/2022 1742   RDW 14.5 12/12/2019 1006   LYMPHSABS 1.2 11/10/2020 1350   LYMPHSABS 1.5 07/31/2019 1401   MONOABS 0.8 11/10/2020 1350   EOSABS 0.1 11/10/2020 1350   EOSABS 0.2 07/31/2019 1401   BASOSABS 0.0 11/10/2020 1350   BASOSABS 0.0 07/31/2019 1401    BMET    Component Value Date/Time   NA 140 12/22/2022 0905   NA 136 12/12/2019 1006   K 4.0 12/22/2022 0905   CL 108 12/22/2022 0905   CO2 20 (L) 12/22/2022 0905   GLUCOSE 102 (H) 12/22/2022 0905   BUN 14 12/22/2022 0905   BUN 18 12/12/2019 1006   CREATININE 0.98 12/22/2022 1742   CALCIUM 9.5 12/22/2022 0905   GFRNONAA >60 12/22/2022 1742   GFRAA 82 12/12/2019 1006    INR    Component Value Date/Time   INR 1.1 12/22/2022 0905     Intake/Output Summary (Last 24 hours) at 12/23/2022 0844 Last data filed at 12/23/2022 0831 Gross per 24 hour  Intake 1668.63 ml  Output 1505 ml  Net 163.63 ml      Assessment/Plan:  62 y.o. male is 1 day postop, s/p: Left common femoral to  below-knee popliteal artery bypass with ipsilateral greater saphenous vein   -Postoperatively the patient developed a small hematoma in the left groin.  He was kept on bedrest last night.  His left groin hematoma appears improved this morning and is soft -He denies any pain currently.  His left groin and lower extremity incisions are well-appearing with mild serosanguineous drainage.  I have reapplied dry dressings -Left lower extremity warm and well-perfused with brisk AT Doppler signal -Morning labs are pending -Okay to get OOB today with PT/OT  Loel Dubonnet, PA-C [facility] [phone] 12/23/2022 8:44 AM   I have seen and evaluated the patient. I agree with the PA note as documented above.  Post-op day 1 status post left common femoral to below-knee popliteal bypass with vein.  Brisk DP signal in the left foot.  Overall looks good.  DC A-line and Foley catheter.  Hemoglobin 11.9 to 9.9.  Sitting up eating breakfast.  Mobilize today.  Cephus Shelling, MD [facility] [place] Office: [phone]

## 2022-12-23 NOTE — Progress Notes (Incomplete)
PHARMACIST LIPID MONITORING [name]. is a 62 y.o. male admitted on 12/22/2022 with PAD.  Pharmacy has been consulted to optimize lipid-lowering therapy with the indication of secondary prevention for clinical ASCVD.  Recent Labs:  Lipid Panel (last 6 months):   Lab Results  Component Value Date   CHOL 123 12/23/2022   TRIG 41 12/23/2022   HDL 29 (L) 12/23/2022   CHOLHDL 4.2 12/23/2022   VLDL 8 12/23/2022   LDLCALC 86 12/23/2022    Hepatic function panel (last 6 months):   Lab Results  Component Value Date   AST 22 12/22/2022   ALT 19 12/22/2022   ALKPHOS 69 12/22/2022   BILITOT 0.7 12/22/2022    SCr (since admission):   Serum creatinine: 1.11 mg/dL 08/65/78 4696 Estimated creatinine clearance: 50.3 mL/min  Current therapy and lipid therapy tolerance Current lipid-lowering therapy: Lipitor 40 mg daily  Assessment:   Patient agrees with changes to lipid-lowering therapy  Plan:    1.Statin intensity: LDL is above goal < 70, will increase Lipitor to 80 mg daily  2.Add ezetimibe (if any one of the following):   Not indicated at this time.  3.Refer to lipid clinic:   No  4.Follow-up with:  Primary care provider - Cristino Martes, NP  5.Follow-up labs after discharge:  Changes in lipid therapy were made. Check a lipid panel in 8-12 weeks then annually.        Thank you for allowing pharmacy to be a part of this patient's care.   Signe Colt, PharmD 12/23/2022 1:03 PM  **Pharmacist phone directory can be found on [email or web address] listed under [facility]**

## 2022-12-23 NOTE — Progress Notes (Addendum)
PT Cancellation Note  Patient Details Name: [name]. MRN: [id number] DOB: January 12, 1961   Cancelled Treatment:    Reason Eval/Treat Not Completed: Active bedrest order remains this morning as pt developed a hematoma at his groin site after surgery and MD asked to keep pt on bedrest until assessed by the team today. Will continue to follow and evaluate as appropriate.   Addendum 10:51am: attempted to see pt again, per RN, he just pulled A-line and so pt needing 30 min bedrest prior to mobility. Will continue to attempt as time/schedule allow, likely evaluation will be pushed until tomorrow.   Vickki Muff, PT, DPT   Acute Rehabilitation Department Office [phone] Secure Chat Communication Preferred   Ronnie Derby 12/23/2022, 7:23 AM

## 2022-12-23 NOTE — Evaluation (Signed)
Occupational Therapy Evaluation Patient Details Name: [name]. MRN: [id number] DOB: 1960-02-06 Today's Date: 12/23/2022   History of Present Illness The pt is a 62 yo male presenting 11/27 for L common femoral-pop bypass. PMH includes: prior aortobifemoral bypass grafting, DM II, HLD, HTN, PVD, and tobacco use.   Clinical Impression   PTA, pt lives with a friend in a two level apartment (bed/bath upstairs) and reports typically Modified Independent with ADLs, IADLs and mobility using cane in the community. Pt cleared for OOB activity by MD/RN though limited by L groin pain. Overall, pt requires Min A for bed mobility though deferred OOB attempts due to pain and lightheadedness (BP WFL). Pt requires approximately Setup for UB ADL and Mod-Max A for LB ADLs. Encouraged LLE extension in bed to lower risk of contracture based on current posturing. Based on current presentation, pt may need postacute rehab stay w/ pt agreeable if needed. However, pt with potential to progress home if pain more controlled.        If plan is discharge home, recommend the following: A lot of help with walking and/or transfers;A lot of help with bathing/dressing/bathroom    Functional Status Assessment  Patient has had a recent decline in their functional status and demonstrates the ability to make significant improvements in function in a reasonable and predictable amount of time.  Equipment Recommendations  Other (comment) (TBD)    Recommendations for Other Services       Precautions / Restrictions Precautions Precautions: Fall Restrictions Weight Bearing Restrictions: No      Mobility Bed Mobility Overal bed mobility: Needs Assistance Bed Mobility: Sit to Supine, Supine to Sit     Supine to sit: Min assist Sit to supine: Min assist        Transfers                   General transfer comment: pt deferred due to lightheadedness and pain      Balance Overall balance  assessment: Needs assistance Sitting-balance support: Bilateral upper extremity supported, Feet supported Sitting balance-Leahy Scale: Fair                                     ADL either performed or assessed with clinical judgement   ADL Overall ADL's : Needs assistance/impaired Eating/Feeding: Independent   Grooming: Set up;Sitting   Upper Body Bathing: Set up;Sitting   Lower Body Bathing: Moderate assistance;Sitting/lateral leans   Upper Body Dressing : Set up;Sitting   Lower Body Dressing: Maximal assistance;Sitting/lateral leans;Bed level                 General ADL Comments: Limited to EOB only due to pain, lightheadedness. Noted external rotation of LLE in bed- encouraged to point knee/toes to ceiling to improve posturing and avoid contracture though pt limited in this by pain     Vision Ability to See in Adequate Light: 0 Adequate Patient Visual Report: No change from baseline Vision Assessment?: No apparent visual deficits     Perception         Praxis         Pertinent Vitals/Pain Pain Assessment Pain Assessment: 0-10 Pain Score: 8  Pain Location: L groin Pain Descriptors / Indicators: Sore, Grimacing, Guarding Pain Intervention(s): Monitored during session, RN gave pain meds during session, Patient requesting pain meds-RN notified, Limited activity within patient's tolerance, Repositioned     Extremity/Trunk  Assessment Upper Extremity Assessment Upper Extremity Assessment: Overall WFL for tasks assessed;Right hand dominant   Lower Extremity Assessment Lower Extremity Assessment: Defer to PT evaluation   Cervical / Trunk Assessment Cervical / Trunk Assessment: Normal   Communication Communication Communication: No apparent difficulties   Cognition Arousal: Alert Behavior During Therapy: WFL for tasks assessed/performed Overall Cognitive Status: Within Functional Limits for tasks assessed                                        General Comments  RN in to give meds, noted some drainage at L groin bandage - RN and charge RN in to assess, marked bandage with marker, noted drainage did not go past marked line. d    Exercises     Shoulder Instructions      Home Living Family/patient expects to be discharged to:: Private residence Living Arrangements: Non-relatives/Friends Available Help at Discharge: Friend(s);Family;Available PRN/intermittently Type of Home: Apartment Home Access: Level entry     Home Layout: Two level;Bed/bath upstairs     Bathroom Shower/Tub: Chief Strategy Officer: Standard     Home Equipment: Cane - single point   Additional Comments: lives with a friend that pt says is more like a Acupuncturist. has some siblings but likley not able to provide consistent assist      Prior Functioning/Environment Prior Level of Function : Independent/Modified Independent             Mobility Comments: cane for mobility outside of the home ADLs Comments: Indep with ADLs, IADLs        OT Problem List: Decreased strength;Decreased activity tolerance;Impaired balance (sitting and/or standing);Pain      OT Treatment/Interventions: Self-care/ADL training;Therapeutic exercise;Energy conservation;DME and/or AE instruction;Therapeutic activities;Patient/family education    OT Goals(Current goals can be found in the care plan section) Acute Rehab OT Goals Patient Stated Goal: pain control OT Goal Formulation: With patient Time For Goal Achievement: 01/06/23 Potential to Achieve Goals: Good  OT Frequency: Min 1X/week    Co-evaluation              AM-PAC OT "6 Clicks" Daily Activity     Outcome Measure Help from another person eating meals?: None Help from another person taking care of personal grooming?: A Little Help from another person toileting, which includes using toliet, bedpan, or urinal?: A Lot Help from another person bathing (including washing,  rinsing, drying)?: A Lot Help from another person to put on and taking off regular upper body clothing?: A Little Help from another person to put on and taking off regular lower body clothing?: A Lot 6 Click Score: 16   End of Session Nurse Communication: Mobility status  Activity Tolerance: Patient limited by pain Patient left: in bed;with call bell/phone within reach;with bed alarm set  OT Visit Diagnosis: Unsteadiness on feet (R26.81);Other abnormalities of gait and mobility (R26.89);Muscle weakness (generalized) (M62.81)                Time: 8295-6213 OT Time Calculation (min): 21 min Charges:  OT General Charges $OT Visit: 1 Visit OT Evaluation $OT Eval Low Complexity: 1 Low  Bradd Canary, OTR/L Acute Rehab Services Office: [phone]   Lorre Munroe 12/23/2022, 11:32 AM

## 2022-12-23 NOTE — Plan of Care (Signed)
  Problem: Education: Goal: Knowledge of prescribed regimen will improve Outcome: Progressing   Problem: Activity: Goal: Ability to tolerate increased activity will improve Outcome: Progressing   Problem: Bowel/Gastric: Goal: Gastrointestinal status for postoperative course will improve Outcome: Progressing   Problem: Clinical Measurements: Goal: Postoperative complications will be avoided or minimized Outcome: Progressing Goal: Signs and symptoms of graft occlusion will improve Outcome: Progressing   Problem: Skin Integrity: Goal: Demonstration of wound healing without infection will improve Outcome: Progressing   Problem: Education: Goal: Knowledge of General Education information will improve Description: Including pain rating scale, medication(s)/side effects and non-pharmacologic comfort measures Outcome: Progressing   Problem: Health Behavior/Discharge Planning: Goal: Ability to manage health-related needs will improve Outcome: Progressing   Problem: Clinical Measurements: Goal: Ability to maintain clinical measurements within normal limits will improve Outcome: Progressing Goal: Will remain free from infection Outcome: Progressing Goal: Diagnostic test results will improve Outcome: Progressing Goal: Respiratory complications will improve Outcome: Progressing Goal: Cardiovascular complication will be avoided Outcome: Progressing   Problem: Activity: Goal: Risk for activity intolerance will decrease Outcome: Progressing   Problem: Nutrition: Goal: Adequate nutrition will be maintained Outcome: Progressing   Problem: Coping: Goal: Level of anxiety will decrease Outcome: Progressing   Problem: Elimination: Goal: Will not experience complications related to bowel motility Outcome: Progressing Goal: Will not experience complications related to urinary retention Outcome: Progressing   Problem: Pain Management: Goal: General experience of comfort will  improve Outcome: Progressing   Problem: Safety: Goal: Ability to remain free from injury will improve Outcome: Progressing   Problem: Skin Integrity: Goal: Risk for impaired skin integrity will decrease Outcome: Progressing   Problem: Education: Goal: Knowledge of prescribed regimen will improve Outcome: Progressing   Problem: Activity: Goal: Ability to tolerate increased activity will improve Outcome: Progressing   Problem: Bowel/Gastric: Goal: Gastrointestinal status for postoperative course will improve Outcome: Progressing   Problem: Clinical Measurements: Goal: Postoperative complications will be avoided or minimized Outcome: Progressing Goal: Signs and symptoms of graft occlusion will improve Outcome: Progressing   Problem: Skin Integrity: Goal: Demonstration of wound healing without infection will improve Outcome: Progressing

## 2022-12-24 ENCOUNTER — Encounter (HOSPITAL_COMMUNITY): Payer: Self-pay | Admitting: Vascular Surgery

## 2022-12-24 LAB — CBC
HCT: 32 % — ABNORMAL LOW (ref 39.0–52.0)
Hemoglobin: 10 g/dL — ABNORMAL LOW (ref 13.0–17.0)
MCH: 28.2 pg (ref 26.0–34.0)
MCHC: 31.3 g/dL (ref 30.0–36.0)
MCV: 90.1 fL (ref 80.0–100.0)
Platelets: 185 10*3/uL (ref 150–400)
RBC: 3.55 MIL/uL — ABNORMAL LOW (ref 4.22–5.81)
RDW: 16.7 % — ABNORMAL HIGH (ref 11.5–15.5)
WBC: 6.9 10*3/uL (ref 4.0–10.5)
nRBC: 0 % (ref 0.0–0.2)

## 2022-12-24 LAB — BASIC METABOLIC PANEL
Anion gap: 5 (ref 5–15)
BUN: 10 mg/dL (ref 8–23)
CO2: 24 mmol/L (ref 22–32)
Calcium: 8.5 mg/dL — ABNORMAL LOW (ref 8.9–10.3)
Chloride: 109 mmol/L (ref 98–111)
Creatinine, Ser: 0.86 mg/dL (ref 0.61–1.24)
GFR, Estimated: >60 mL/min (ref >60)
Glucose, Bld: 113 mg/dL — ABNORMAL HIGH (ref 70–99)
Potassium: 3.7 mmol/L (ref 3.5–5.1)
Sodium: 138 mmol/L (ref 135–145)

## 2022-12-24 NOTE — Progress Notes (Signed)
PHARMACIST LIPID MONITORING [name]. is a 62 y.o. male admitted on 12/22/2022 with PAD.  Pharmacy has been consulted to optimize lipid-lowering therapy with the indication of secondary prevention for clinical ASCVD.  Recent Labs:  Lipid Panel (last 6 months):   Lab Results  Component Value Date   CHOL 123 12/23/2022   TRIG 41 12/23/2022   HDL 29 (L) 12/23/2022   CHOLHDL 4.2 12/23/2022   VLDL 8 12/23/2022   LDLCALC 86 12/23/2022    Hepatic function panel (last 6 months):   Lab Results  Component Value Date   AST 22 12/22/2022   ALT 19 12/22/2022   ALKPHOS 69 12/22/2022   BILITOT 0.7 12/22/2022    SCr (since admission):   Serum creatinine: 0.86 mg/dL 01/60/10 9323 Estimated creatinine clearance: 64.9 mL/min  Current therapy and lipid therapy tolerance Current lipid-lowering therapy: Lipitor 40 mg daily  Assessment:   Patient agrees with changes to lipid-lowering therapy  Plan:    1.Statin intensity: LDL is above goal < 70, will increase Lipitor to 80 mg daily  2.Add ezetimibe (if any one of the following):   Not indicated at this time.  3.Refer to lipid clinic:   No  4.Follow-up with:  Primary care provider - Ruben Martes, NP  5.Follow-up labs after discharge:  Changes in lipid therapy were made. Check a lipid panel in 8-12 weeks then annually.      Thank you for allowing pharmacy to be a part of this patient's care.   Trixie Rude, PharmD Clinical Pharmacist 12/24/2022  10:57 AM

## 2022-12-24 NOTE — TOC Initial Note (Addendum)
Transition of Care (TOC) - Initial/Assessment Note  Sander Radon, BSN Transitions of Care Unit 4E- RN Case Manager See Treatment Team for direct phone #   Patient Details  Name: [name]. MRN: [id number] Date of Birth: 1960/12/14  Transition of Care [facility]) CM/SW Contact:    Darrold Span, RN Phone Number: 12/24/2022, 3:00 PM  Clinical Narrative:                 Received msg from CSW that pt declining SNF - prefers to return home - Cm spoke with pt at bedside regarding transition plans- per pt he is home with friend- will have someone that can provide transport.  Pt also voiced he has RW and cane at home- declined any new DME needs at this time.   List for [facility] choice provided- Per CMS guidelines from [email or web address].pl website with star ratings (copy placed in shadow chart)- pt voiced he has had HH in past- at this time does not have  a preference for provider-  Pt does indicate that his insurance will change to [facility] on Jan. 1, 2025.   Pt will need HH orders for discharge- CM will f/u for [facility] referral.   Expected Discharge Plan: Home w Home Health Services Barriers to Discharge: Continued Medical Work up   Patient Goals and CMS Choice Patient states their goals for this hospitalization and ongoing recovery are:: return home CMS Medicare.gov Compare Post Acute Care list provided to:: Patient Choice offered to / list presented to : Patient      Expected Discharge Plan and Services   Discharge Planning Services: CM Consult Post Acute Care Choice: Home Health Living arrangements for the past 2 months: Apartment                 DME Arranged: N/A DME Agency: NA       HH Arranged: PT, OT          Prior Living Arrangements/Services Living arrangements for the past 2 months: Apartment Lives with:: Self, Roommate Patient language and need for interpreter reviewed:: Yes Do you feel safe going back to the place where you live?: Yes      Need  for Family Participation in Patient Care: Yes (Comment) Care giver support system in place?: Yes (comment) Current home services: DME (rolling walker, cane) Criminal Activity/Legal Involvement Pertinent to Current Situation/Hospitalization: No - Comment as needed  Activities of Daily Living   ADL Screening (condition at time of admission) Independently performs ADLs?: Yes (appropriate for developmental age) Is the patient deaf or have difficulty hearing?: No Does the patient have difficulty seeing, even when wearing glasses/contacts?: No Does the patient have difficulty concentrating, remembering, or making decisions?: No  Permission Sought/Granted Permission sought to share information with : Facility Industrial/product designer granted to share information with : Yes, Verbal Permission Granted     Permission granted to share info w AGENCY: HH        Emotional Assessment Appearance:: Appears stated age Attitude/Demeanor/Rapport: Engaged Affect (typically observed): Accepting Orientation: : Oriented to Self, Oriented to Place, Oriented to  Time, Oriented to Situation Alcohol / Substance Use: Not Applicable Psych Involvement: No (comment)  Admission diagnosis:  Femoral-popliteal bypass graft occlusion, left (HCC) [W41.324M] PAD (peripheral artery disease) (HCC) [I73.9] Patient Active Problem List   Diagnosis Date Noted   Femoral-popliteal bypass graft occlusion, left (HCC) 12/22/2022   PAD (peripheral artery disease) (HCC) 12/22/2022   Atherosclerosis of artery of extremity with rest pain (  HCC) 12/17/2022   Critical limb ischemia of left lower extremity with bypass graft (HCC) 10/28/2020   Marijuana abuse 10/28/2020   Nicotine dependence, cigarettes, uncomplicated 10/05/2020   Mixed hyperlipidemia 10/05/2020   Protein-calorie malnutrition, severe 06/07/2020   Osteomyelitis of fifth toe of left foot (HCC) 06/07/2020   Tobacco use    Toe infection 06/06/2020   PVD  (peripheral vascular disease) (HCC) 12/19/2019   Claudication in peripheral vascular disease (HCC) 08/16/2019   Critical ischemia of foot (HCC) 07/16/2019   Critical lower limb ischemia (HCC) 07/10/2019   Pure hypercholesterolemia 10/13/2018   Type 2 diabetes mellitus without complication, without long-term current use of insulin (HCC) 10/13/2018   Tobacco use disorder 10/13/2018   Essential hypertension 10/23/2009   PCP:  Cristino Martes, NP Pharmacy:   [facility] - [place], [place] - [address] [place] [address] Phone: [phone] Fax: [phone]  [id number] [place], [place] - [address] AT [facility] & [address] [place] [address] Phone: [phone] Fax: [phone]     Social Determinants of Health (SDOH) Social History: SDOH Screenings   Food Insecurity: No Food Insecurity (12/23/2022)  Housing: Low Risk  (12/23/2022)  Transportation Needs: No Transportation Needs (12/23/2022)  Utilities: Not At Risk (12/23/2022)  Depression (PHQ2-9): Low Risk  (10/20/2018)  Tobacco Use: High Risk (12/22/2022)   SDOH Interventions:     Readmission Risk Interventions    10/29/2020    2:38 PM 10/14/2020    5:02 PM 06/16/2020    3:46 PM  Readmission Risk Prevention Plan  Post Dischage Appt   Complete  Medication Screening   Complete  Transportation Screening Complete Complete Complete  PCP or Specialist Appt within 5-7 Days  Not Complete   Not Complete comments  per vascular post op appointment 11/04/2020   PCP or Specialist Appt within 3-5 Days Complete    Home Care Screening  Complete   Medication Review (RN CM)  Complete   HRI or Home Care Consult Complete    Social Work Consult for Recovery Care Planning/Counseling Complete    Palliative Care Screening Not Applicable    Medication Review Oceanographer) Complete

## 2022-12-24 NOTE — TOC CM/SW Note (Signed)
CSW spoke with pt and advised of PT recommendation for SNF. CSW explained process and answered questions. Pt noting he would prefer to go home with [facility]. CSW noted that this would be minimal intervention, pt noted he has someone that can help him out "a little bit". CSW asked if there are stairs in pt's house, he stated yes, but was not concerned about this. Pt notes he has a walker at home, agreeable to [facility] being set up. CSW consulted RNCM to assist with setting up DME and services. TOC will continue to follow.

## 2022-12-24 NOTE — Anesthesia Postprocedure Evaluation (Signed)
Anesthesia Post Note  Patient: [name].  Procedure(s) Performed: LEFT COMMON FEMORAL-BELOW KNEE ARTERY BYPASS WITH SAPHENOUS VEIN HARVEST (Left: Leg Upper)     Patient location during evaluation: PACU Anesthesia Type: General Level of consciousness: awake and alert Pain management: pain level controlled Vital Signs Assessment: post-procedure vital signs reviewed and stable Respiratory status: spontaneous breathing, nonlabored ventilation, respiratory function stable and patient connected to nasal cannula oxygen Cardiovascular status: blood pressure returned to baseline and stable Postop Assessment: no apparent nausea or vomiting Anesthetic complications: no   No notable events documented.  Last Vitals:  Vitals:   12/24/22 1151 12/24/22 1600  BP: 131/72 124/65  Pulse: 73   Resp: 17 14  Temp: 36.8 C   SpO2: 99%     Last Pain:  Vitals:   12/24/22 1520  TempSrc:   PainSc: 6    Pain Goal: Patients Stated Pain Goal: 1 (12/24/22 1456)                 Lowella Curb

## 2022-12-24 NOTE — Progress Notes (Addendum)
  Progress Note    12/24/2022 8:01 AM 2 Days Post-Op  Subjective:  sore in the left groin    Vitals:   12/24/22 0306 12/24/22 0600  BP: 118/75 132/67  Pulse: 70 72  Resp: 16 19  Temp: 98.3 F (36.8 C)   SpO2: 96% 100%    Physical Exam: General:  resting comfortably, alert and oriented x4 Cardiac:  regular Lungs:  nonlabored Incisions:  L groin and LLE incisions well appearing with minimal serosanguineous drainage. Small hematoma in the left groin/proximal saphenectomy site, unchanged from yesterday Extremities:  LLE warm and well perfused with brisk AT doppler signal   CBC    Component Value Date/Time   WBC 9.1 12/23/2022 0801   RBC 3.49 (L) 12/23/2022 0801   HGB 9.9 (L) 12/23/2022 0801   HGB 11.7 (L) 12/12/2019 1006   HCT 30.9 (L) 12/23/2022 0801   HCT 36.2 (L) 12/12/2019 1006   PLT 193 12/23/2022 0801   PLT 249 12/12/2019 1006   MCV 88.5 12/23/2022 0801   MCV 90 12/12/2019 1006   MCH 28.4 12/23/2022 0801   MCHC 32.0 12/23/2022 0801   RDW 16.5 (H) 12/23/2022 0801   RDW 14.5 12/12/2019 1006   LYMPHSABS 1.2 11/10/2020 1350   LYMPHSABS 1.5 07/31/2019 1401   MONOABS 0.8 11/10/2020 1350   EOSABS 0.1 11/10/2020 1350   EOSABS 0.2 07/31/2019 1401   BASOSABS 0.0 11/10/2020 1350   BASOSABS 0.0 07/31/2019 1401    BMET    Component Value Date/Time   NA 134 (L) 12/23/2022 0801   NA 136 12/12/2019 1006   K 4.0 12/23/2022 0801   CL 105 12/23/2022 0801   CO2 22 12/23/2022 0801   GLUCOSE 118 (H) 12/23/2022 0801   BUN 12 12/23/2022 0801   BUN 18 12/12/2019 1006   CREATININE 1.11 12/23/2022 0801   CALCIUM 8.4 (L) 12/23/2022 0801   GFRNONAA >60 12/23/2022 0801   GFRAA 82 12/12/2019 1006    INR    Component Value Date/Time   INR 1.1 12/22/2022 0905     Intake/Output Summary (Last 24 hours) at 12/24/2022 0801 Last data filed at 12/23/2022 2151 Gross per 24 hour  Intake --  Output 1350 ml  Net -1350 ml      Assessment/Plan:  62 y.o. male is 2 days  post op, s/p: Left common femoral to below-knee popliteal artery bypass with ipsilateral greater saphenous vein    -Doing well this morning. Still sore in the left groin however this is unchanged from yesterday -L groin and LLE incisions well appearing. Minimal serosanguineous drainage from lower leg incisions  -L groin/proximal thigh hematoma unchanged. Hematoma remains soft -LLE warm and well perfused with brisk AT doppler signal -Continue OOB with PT/OT. Morning labs pending   Loel Dubonnet PA-C [facility] [phone] 12/24/2022 8:01 AM  I have seen and evaluated the patient. I agree with the PA note as documented above.  Status post left common femoral to below-knee popliteal bypass with vein.  All of his incisions look good.  Brisk left DP signal.  Not much mobility and need to work with therapy today.  Cephus Shelling, MD [facility] [place] Office: [phone]

## 2022-12-24 NOTE — Evaluation (Signed)
Physical Therapy Evaluation Patient Details Name: [name]. MRN: [id number] DOB: 1960-03-22 Today's Date: 12/24/2022  History of Present Illness  The pt is a 62 yo male presenting 11/27 for L common femoral-pop bypass. L groin hematoma noted 11/27 but improving. PMH includes: prior aortobifemoral bypass grafting, DM II, HLD, HTN, PVD, and tobacco use.  Clinical Impression  Pt is presenting below baseline level of functioning. Previously pt was intermittently Mod I with activities; currently pt is Min A for sit to stand, CGA for sitting > Supine and was unable to progress gait due to pain in the LLE. Due to pt current functional status home set up and available assistance at home recommending skilled physical therapy services < 3 hours/day on discharge from acute care hospital setting in order to improve balance, strength and functional mobility to decrease risk for falls, injury and re-hospitalization. Pt was limited by pain during session today.         If plan is discharge home, recommend the following: Help with stairs or ramp for entrance;Assistance with cooking/housework;Assist for transportation;A little help with walking and/or transfers   Can travel by private vehicle   No    Equipment Recommendations Rolling walker (2 wheels);BSC/3in1     Functional Status Assessment Patient has had a recent decline in their functional status and demonstrates the ability to make significant improvements in function in a reasonable and predictable amount of time.     Precautions / Restrictions Precautions Precautions: Fall Restrictions Weight Bearing Restrictions: No      Mobility  Bed Mobility Overal bed mobility: Needs Assistance Bed Mobility: Sit to Supine       Sit to supine: Supervision   General bed mobility comments: handheld assist to lift trunk EOB. good effort on pt part    Transfers Overall transfer level: Needs assistance Equipment used: Rolling walker (2  wheels) Transfers: Sit to/from Stand, Bed to chair/wheelchair/BSC Sit to Stand: Min assist Stand pivot transfers: Min assist         General transfer comment: able to rise fairly easily but noted LLE WB avoidance. With cues, pt able to slightly improve WB through LLE to step to EOB with Min A for RW mgmt and physical assist for balance due to posterior COM over BOS    Ambulation/Gait   Pre-gait activities: Pt took steps from recliner to EOB with encouragement to WB through the LLE. Toe first WB pt currently due to pain unwilling to try flat foot on the ground. Educated that this may help with pain but pt is anxious about increasing pain. General Gait Details: deferred gait due to pain       Balance Overall balance assessment: Needs assistance Sitting-balance support: Feet supported, Single extremity supported, Bilateral upper extremity supported Sitting balance-Leahy Scale: Fair   Postural control: Posterior lean Standing balance support: Bilateral upper extremity supported, During functional activity, Reliant on assistive device for balance Standing balance-Leahy Scale: Poor       Pertinent Vitals/Pain Pain Assessment Pain Assessment: 0-10 Pain Score: 9  Pain Location: L groin Pain Descriptors / Indicators: Sore, Grimacing, Guarding, Throbbing Pain Intervention(s): Monitored during session, Limited activity within patient's tolerance    Home Living Family/patient expects to be discharged to:: Private residence Living Arrangements: Non-relatives/Friends Available Help at Discharge: Friend(s);Family;Available PRN/intermittently Type of Home: Apartment Home Access: Level entry       Home Layout: Two level;Bed/bath upstairs Home Equipment: Gilmer Mor - single point Additional Comments: lives with a friend that pt says is  more like a landlord. has some siblings but likley not able to provide consistent assist    Prior Function Prior Level of Function : Independent/Modified  Independent             Mobility Comments: cane for mobility outside of the home ADLs Comments: Indep with ADLs, IADLs     Extremity/Trunk Assessment   Upper Extremity Assessment Upper Extremity Assessment: Defer to OT evaluation    Lower Extremity Assessment Lower Extremity Assessment: LLE deficits/detail LLE Deficits / Details: recent bypass graft pt has significant pain LLE: Unable to fully assess due to pain    Cervical / Trunk Assessment Cervical / Trunk Assessment: Normal  Communication   Communication Communication: No apparent difficulties  Cognition Arousal: Alert Behavior During Therapy: WFL for tasks assessed/performed Overall Cognitive Status: Within Functional Limits for tasks assessed        General Comments General comments (skin integrity, edema, etc.): LLE was repositioned to be elevated at end of session. Pt educated on importance of mobility and movement to help with tissue adhesions and healing process. Pt incision are intact.        Assessment/Plan    PT Assessment Patient needs continued PT services  PT Problem List Pain;Decreased activity tolerance;Decreased balance       PT Treatment Interventions DME instruction;Therapeutic exercise;Gait training;Balance training;Stair training;Functional mobility training;Therapeutic activities;Patient/family education    PT Goals (Current goals can be found in the Care Plan section)  Acute Rehab PT Goals Patient Stated Goal: To get better and return home PT Goal Formulation: With patient Time For Goal Achievement: 01/07/23 Potential to Achieve Goals: Good    Frequency Min 1X/week        AM-PAC PT "6 Clicks" Mobility  Outcome Measure Help needed turning from your back to your side while in a flat bed without using bedrails?: A Little Help needed moving from lying on your back to sitting on the side of a flat bed without using bedrails?: A Little Help needed moving to and from a bed to a chair  (including a wheelchair)?: A Little Help needed standing up from a chair using your arms (e.g., wheelchair or bedside chair)?: A Little Help needed to walk in hospital room?: A Lot Help needed climbing 3-5 steps with a railing? : Total 6 Click Score: 15    End of Session Equipment Utilized During Treatment: Gait belt Activity Tolerance: Patient tolerated treatment well;Patient limited by pain Patient left: in bed;with bed alarm set;with call bell/phone within reach Nurse Communication: Mobility status PT Visit Diagnosis: Other abnormalities of gait and mobility (R26.89)    Time: 6045-4098 PT Time Calculation (min) (ACUTE ONLY): 10 min   Charges:   PT Evaluation $PT Eval Low Complexity: 1 Low   PT General Charges $$ ACUTE PT VISIT: 1 Visit        Harrel Carina, DPT, CLT  Acute Rehabilitation Services Office: [phone] (Secure chat preferred)   Claudia Desanctis 12/24/2022, 10:46 AM

## 2022-12-24 NOTE — Progress Notes (Signed)
Occupational Therapy Treatment Patient Details Name: [name]. MRN: [id number] DOB: May 14, 1960 Today's Date: 12/24/2022   History of present illness The pt is a 62 yo male presenting 11/27 for L common femoral-pop bypass. L groin hematoma noted 11/27 but improving. PMH includes: prior aortobifemoral bypass grafting, DM II, HLD, HTN, PVD, and tobacco use.   OT comments  Pt making excellent progress with pain premedication. Focused session on initial OOB transfers with RW at [facility] A and gradual improvements in LLE WB tolerance. Pt continues to require extensive assist for LB ADLs d/t L groin discomfort and difficulty reaching to feet. Pt also considering rehab at [facility] d/t concerns of managing flight of steps at home. Repositioned LLE in extension at end of session to reduce tightness.       If plan is discharge home, recommend the following:  A lot of help with walking and/or transfers;A lot of help with bathing/dressing/bathroom   Equipment Recommendations  Other (comment) (RW)    Recommendations for Other Services      Precautions / Restrictions Precautions Precautions: Fall Restrictions Weight Bearing Restrictions: No       Mobility Bed Mobility Overal bed mobility: Needs Assistance Bed Mobility: Supine to Sit     Supine to sit: Min assist     General bed mobility comments: handheld assist to lift trunk EOB. good effort on pt part    Transfers Overall transfer level: Needs assistance Equipment used: Rolling walker (2 wheels) Transfers: Sit to/from Stand, Bed to chair/wheelchair/BSC Sit to Stand: Min assist Stand pivot transfers: Min assist         General transfer comment: able to rise fairly easily but noted LLE WB avoidance. With cues, pt able to slightly improve WB through LLE to step to recliner with Min A for RW mgmt     Balance Overall balance assessment: Needs assistance Sitting-balance support: Feet supported, Single extremity supported,  Bilateral upper extremity supported Sitting balance-Leahy Scale: Fair     Standing balance support: Bilateral upper extremity supported, During functional activity, Reliant on assistive device for balance Standing balance-Leahy Scale: Poor                             ADL either performed or assessed with clinical judgement   ADL Overall ADL's : Needs assistance/impaired                     Lower Body Dressing: Maximal assistance;Sitting/lateral leans;Bed level Lower Body Dressing Details (indicate cue type and reason): assist for socks, unable to cross LE or bend to feet due to pain               General ADL Comments: Focus on progression OOB and LLE WB tolerance.    Extremity/Trunk Assessment Upper Extremity Assessment Upper Extremity Assessment: Overall WFL for tasks assessed;Right hand dominant   Lower Extremity Assessment Lower Extremity Assessment: Defer to PT evaluation        Vision   Vision Assessment?: No apparent visual deficits   Perception     Praxis      Cognition Arousal: Alert Behavior During Therapy: WFL for tasks assessed/performed Overall Cognitive Status: Within Functional Limits for tasks assessed                                          Exercises  Shoulder Instructions       General Comments repositioned LLE in extension at end of session, encouraged this position to avoid tightness    Pertinent Vitals/ Pain       Pain Assessment Pain Assessment: Faces Faces Pain Scale: Hurts little more Pain Location: L groin Pain Descriptors / Indicators: Sore, Grimacing, Guarding Pain Intervention(s): Monitored during session, Premedicated before session  Home Living                                          Prior Functioning/Environment              Frequency  Min 1X/week        Progress Toward Goals  OT Goals(current goals can now be found in the care plan section)   Progress towards OT goals: Progressing toward goals  Acute Rehab OT Goals Patient Stated Goal: go to rehab OT Goal Formulation: With patient Time For Goal Achievement: 01/06/23 Potential to Achieve Goals: Good ADL Goals Pt Will Perform Lower Body Bathing: with modified independence;sit to/from stand;sitting/lateral leans Pt Will Perform Lower Body Dressing: with modified independence;sit to/from stand;sitting/lateral leans Pt Will Transfer to Toilet: with supervision;ambulating  Plan      Co-evaluation                 AM-PAC OT "6 Clicks" Daily Activity     Outcome Measure   Help from another person eating meals?: None Help from another person taking care of personal grooming?: A Little Help from another person toileting, which includes using toliet, bedpan, or urinal?: A Lot Help from another person bathing (including washing, rinsing, drying)?: A Lot Help from another person to put on and taking off regular upper body clothing?: A Little Help from another person to put on and taking off regular lower body clothing?: A Lot 6 Click Score: 16    End of Session Equipment Utilized During Treatment: Gait belt;Rolling walker (2 wheels)  OT Visit Diagnosis: Unsteadiness on feet (R26.81);Other abnormalities of gait and mobility (R26.89);Muscle weakness (generalized) (M62.81)   Activity Tolerance Patient tolerated treatment well   Patient Left in chair;with call bell/phone within reach;with chair alarm set   Nurse Communication Mobility status        Time: 1308-6578 OT Time Calculation (min): 21 min  Charges: OT General Charges $OT Visit: 1 Visit OT Treatments $Therapeutic Activity: 8-22 mins  Bradd Canary, OTR/L Acute Rehab Services Office: [phone]   Lorre Munroe 12/24/2022, 8:28 AM

## 2022-12-25 NOTE — Progress Notes (Addendum)
  Progress Note    12/25/2022 8:13 AM 3 Days Post-Op  Subjective:  feels ok. Still sore in the left groin    Vitals:   12/25/22 0306 12/25/22 0751  BP: 122/76 132/68  Pulse: 75 77  Resp: 19 19  Temp: 98.4 F (36.9 C) 98.4 F (36.9 C)  SpO2: 100% 99%    Physical Exam: Lungs:  nonlabored Incisions:  left groin and LLE incisions well appearing. Left groin incision with minimal SS output. Resolving hematoma in the left groin/proximal thigh Extremities:  LLE warm and well perfused with brisk DP doppler signal  CBC    Component Value Date/Time   WBC 6.9 12/24/2022 0906   RBC 3.55 (L) 12/24/2022 0906   HGB 10.0 (L) 12/24/2022 0906   HGB 11.7 (L) 12/12/2019 1006   HCT 32.0 (L) 12/24/2022 0906   HCT 36.2 (L) 12/12/2019 1006   PLT 185 12/24/2022 0906   PLT 249 12/12/2019 1006   MCV 90.1 12/24/2022 0906   MCV 90 12/12/2019 1006   MCH 28.2 12/24/2022 0906   MCHC 31.3 12/24/2022 0906   RDW 16.7 (H) 12/24/2022 0906   RDW 14.5 12/12/2019 1006   LYMPHSABS 1.2 11/10/2020 1350   LYMPHSABS 1.5 07/31/2019 1401   MONOABS 0.8 11/10/2020 1350   EOSABS 0.1 11/10/2020 1350   EOSABS 0.2 07/31/2019 1401   BASOSABS 0.0 11/10/2020 1350   BASOSABS 0.0 07/31/2019 1401    BMET    Component Value Date/Time   NA 138 12/24/2022 0906   NA 136 12/12/2019 1006   K 3.7 12/24/2022 0906   CL 109 12/24/2022 0906   CO2 24 12/24/2022 0906   GLUCOSE 113 (H) 12/24/2022 0906   BUN 10 12/24/2022 0906   BUN 18 12/12/2019 1006   CREATININE 0.86 12/24/2022 0906   CALCIUM 8.5 (L) 12/24/2022 0906   GFRNONAA >60 12/24/2022 0906   GFRAA 82 12/12/2019 1006    INR    Component Value Date/Time   INR 1.1 12/22/2022 0905     Intake/Output Summary (Last 24 hours) at 12/25/2022 0813 Last data filed at 12/24/2022 2300 Gross per 24 hour  Intake 120 ml  Output --  Net 120 ml      Assessment/Plan:  62 y.o. male is 3 days post op, s/p: Left common femoral to below-knee popliteal artery bypass  with ipsilateral greater saphenous vein   -No complaints this morning. Says his left groin is still sore when moving around -L groin with small area of central incision dehiscence with SS drainage. Will place prevena vac today -LLE incisions well appearing and dry. Continue dry dressing changes as needed for any drainage -LLE well perfused with brisk DP doppler signal -H/H stable yesterday. No signs of further bleeding overnight -Continue OOB with PT/OT. Current recs upon discharge is for SNF   Loel Dubonnet, [place] [facility] [phone] 12/25/2022 8:13 AM   I have seen and evaluated the patient. I agree with the PA note as documented above.  Postop day 3 status post left common femoral to below-knee popliteal bypass with vein.  Has a brisk DP signal in the left foot.  Left groin incision with some dehiscence and serosanguineous drainage.  Will place Prevena VAC to the left groin given hx of aortobifemoral bypass.  Has had limited mobility so far and therapy is recommending SNF.  Discussed he needs to mobilize today.  Cephus Shelling, MD [facility] [place] Office: [phone]

## 2022-12-25 NOTE — Progress Notes (Addendum)
Mobility Specialist Progress Note:   12/25/22 1104  Mobility  Activity Ambulated with assistance in hallway  Level of Assistance Contact guard assist, steadying assist  Assistive Device Front wheel walker  Distance Ambulated (ft) 90 ft  Activity Response Tolerated well  Mobility Referral Yes  $Mobility charge 1 Mobility  Mobility Specialist Start Time (ACUTE ONLY) [id number]  Mobility Specialist Stop Time (ACUTE ONLY) 1000  Mobility Specialist Time Calculation (min) (ACUTE ONLY) 21 min   Pre Mobility: 87 HR  During Mobility: 95 HR Post Mobility: 84 HR  Pt received in bed, agreeable to mobility. SB bed mobility. CG during ambulation. Pt c/o LLE pain resulting in limited WB tolerance during ambulation. Rated pain 7/10. Pt denied any other discomfort, asx throughout. When returning to room pt requesting to use BR. Void successful. Pt performed pericare independently. Pt left in chair with call bell in reach and all needs met.   Leory Plowman  Mobility Specialist Please contact via [employer] office at [phone]

## 2022-12-25 NOTE — Plan of Care (Signed)
  Problem: Education: Goal: Knowledge of prescribed regimen will improve Outcome: Progressing   Problem: Activity: Goal: Ability to tolerate increased activity will improve Outcome: Progressing   Problem: Bowel/Gastric: Goal: Gastrointestinal status for postoperative course will improve Outcome: Progressing   Problem: Clinical Measurements: Goal: Postoperative complications will be avoided or minimized Outcome: Progressing Goal: Signs and symptoms of graft occlusion will improve Outcome: Progressing   Problem: Skin Integrity: Goal: Demonstration of wound healing without infection will improve Outcome: Progressing   Problem: Education: Goal: Knowledge of General Education information will improve Description: Including pain rating scale, medication(s)/side effects and non-pharmacologic comfort measures Outcome: Progressing   Problem: Health Behavior/Discharge Planning: Goal: Ability to manage health-related needs will improve Outcome: Progressing   Problem: Clinical Measurements: Goal: Ability to maintain clinical measurements within normal limits will improve Outcome: Progressing Goal: Will remain free from infection Outcome: Progressing Goal: Diagnostic test results will improve Outcome: Progressing Goal: Respiratory complications will improve Outcome: Progressing Goal: Cardiovascular complication will be avoided Outcome: Progressing   Problem: Activity: Goal: Risk for activity intolerance will decrease Outcome: Progressing   Problem: Nutrition: Goal: Adequate nutrition will be maintained Outcome: Progressing   Problem: Coping: Goal: Level of anxiety will decrease Outcome: Progressing   Problem: Elimination: Goal: Will not experience complications related to bowel motility Outcome: Progressing Goal: Will not experience complications related to urinary retention Outcome: Progressing   Problem: Pain Management: Goal: General experience of comfort will  improve Outcome: Progressing   Problem: Safety: Goal: Ability to remain free from injury will improve Outcome: Progressing   Problem: Skin Integrity: Goal: Risk for impaired skin integrity will decrease Outcome: Progressing   Problem: Education: Goal: Knowledge of prescribed regimen will improve Outcome: Progressing   Problem: Activity: Goal: Ability to tolerate increased activity will improve Outcome: Progressing   Problem: Bowel/Gastric: Goal: Gastrointestinal status for postoperative course will improve Outcome: Progressing   Problem: Clinical Measurements: Goal: Postoperative complications will be avoided or minimized Outcome: Progressing Goal: Signs and symptoms of graft occlusion will improve Outcome: Progressing   Problem: Skin Integrity: Goal: Demonstration of wound healing without infection will improve Outcome: Progressing

## 2022-12-26 LAB — BASIC METABOLIC PANEL
Anion gap: 6 (ref 5–15)
BUN: 12 mg/dL (ref 8–23)
CO2: 26 mmol/L (ref 22–32)
Calcium: 8.8 mg/dL — ABNORMAL LOW (ref 8.9–10.3)
Chloride: 106 mmol/L (ref 98–111)
Creatinine, Ser: 0.94 mg/dL (ref 0.61–1.24)
GFR, Estimated: >60 mL/min (ref >60)
Glucose, Bld: 95 mg/dL (ref 70–99)
Potassium: 3.8 mmol/L (ref 3.5–5.1)
Sodium: 138 mmol/L (ref 135–145)

## 2022-12-26 LAB — CBC
HCT: 29.8 % — ABNORMAL LOW (ref 39.0–52.0)
Hemoglobin: 9.4 g/dL — ABNORMAL LOW (ref 13.0–17.0)
MCH: 27.6 pg (ref 26.0–34.0)
MCHC: 31.5 g/dL (ref 30.0–36.0)
MCV: 87.4 fL (ref 80.0–100.0)
Platelets: 227 10*3/uL (ref 150–400)
RBC: 3.41 MIL/uL — ABNORMAL LOW (ref 4.22–5.81)
RDW: 16 % — ABNORMAL HIGH (ref 11.5–15.5)
WBC: 6.4 10*3/uL (ref 4.0–10.5)
nRBC: 0 % (ref 0.0–0.2)

## 2022-12-26 NOTE — Progress Notes (Addendum)
  Progress Note    12/26/2022 9:51 AM 4 Days Post-Op  Subjective:  no complaints    Vitals:   12/26/22 0744 12/26/22 0900  BP: 135/86 135/86  Pulse: 85   Resp: 19   Temp: 97.8 F (36.6 C)   SpO2:      Physical Exam: Lungs:  nonlabored Incisions:  left groin incision with central dehiscence and SS drainage. New prevena placed. LLE incisions healing appropriately without dehiscence or drainage Extremities:  LLE well perfused with brisk DP doppler signal  CBC    Component Value Date/Time   WBC 6.4 12/26/2022 0345   RBC 3.41 (L) 12/26/2022 0345   HGB 9.4 (L) 12/26/2022 0345   HGB 11.7 (L) 12/12/2019 1006   HCT 29.8 (L) 12/26/2022 0345   HCT 36.2 (L) 12/12/2019 1006   PLT 227 12/26/2022 0345   PLT 249 12/12/2019 1006   MCV 87.4 12/26/2022 0345   MCV 90 12/12/2019 1006   MCH 27.6 12/26/2022 0345   MCHC 31.5 12/26/2022 0345   RDW 16.0 (H) 12/26/2022 0345   RDW 14.5 12/12/2019 1006   LYMPHSABS 1.2 11/10/2020 1350   LYMPHSABS 1.5 07/31/2019 1401   MONOABS 0.8 11/10/2020 1350   EOSABS 0.1 11/10/2020 1350   EOSABS 0.2 07/31/2019 1401   BASOSABS 0.0 11/10/2020 1350   BASOSABS 0.0 07/31/2019 1401    BMET    Component Value Date/Time   NA 138 12/26/2022 0345   NA 136 12/12/2019 1006   K 3.8 12/26/2022 0345   CL 106 12/26/2022 0345   CO2 26 12/26/2022 0345   GLUCOSE 95 12/26/2022 0345   BUN 12 12/26/2022 0345   BUN 18 12/12/2019 1006   CREATININE 0.94 12/26/2022 0345   CALCIUM 8.8 (L) 12/26/2022 0345   GFRNONAA >60 12/26/2022 0345   GFRAA 82 12/12/2019 1006    INR    Component Value Date/Time   INR 1.1 12/22/2022 0905     Intake/Output Summary (Last 24 hours) at 12/26/2022 0951 Last data filed at 12/26/2022 8657 Gross per 24 hour  Intake 240 ml  Output 2240 ml  Net -2000 ml      Assessment/Plan:  62 y.o. male is 4 days post op, s/p:  Left common femoral to below-knee popliteal artery bypass with ipsilateral greater saphenous vein    -LLE  incisions well appearing and dry. Steri strips in place -Left groin prevena system died overnight and sponge lost its seal. Will replace prevena sponge this morning -LLE well perfused with brisk DP doppler signal -Hemoglobin stable at 9.4. No further signs of bleeding -Continue to mobilize. Walked in the hall yesterday. Current PT/OT recs 2 days ago was for SNF   [facility] PA-C [facility] [phone] 12/26/2022 9:51 AM   I have seen and evaluated the patient. I agree with the PA note as documented above.  Prevena applied to the left groin yesterday for some serosanguineous drainage.  We will replace this morning as it has lost seal.  Left lower extremity with brisk DP Doppler signal.  Needs to work on mobility otherwise therapy was recommending SNF.  Cephus Shelling, MD [facility] [place] Office: [phone]

## 2022-12-26 NOTE — Progress Notes (Signed)
Mobility Specialist Progress Note:   12/26/22 1314  Mobility  Activity Ambulated with assistance in hallway  Level of Assistance Contact guard assist, steadying assist  Assistive Device Front wheel walker  Distance Ambulated (ft) 175 ft  Activity Response Tolerated well  Mobility Referral Yes  $Mobility charge 1 Mobility  Mobility Specialist Start Time (ACUTE ONLY) 1140  Mobility Specialist Stop Time (ACUTE ONLY) 1151  Mobility Specialist Time Calculation (min) (ACUTE ONLY) 11 min   Pre Mobility: 83 HR  During Mobility: 106 HR Post Mobility: 81 HR   Pt received in bed, agreeable to mobility. Pt c/o slight LLE soreness but displayed improved WB tolerance and increased gait distance this session. Asx throughout. VSS. Pt returned to bed with call bell in reach and all needs met.   Leory Plowman  Mobility Specialist Please contact via [employer] office at [phone]

## 2022-12-27 LAB — BASIC METABOLIC PANEL
Anion gap: 4 — ABNORMAL LOW (ref 5–15)
BUN: 14 mg/dL (ref 8–23)
CO2: 28 mmol/L (ref 22–32)
Calcium: 8.9 mg/dL (ref 8.9–10.3)
Chloride: 105 mmol/L (ref 98–111)
Creatinine, Ser: 0.94 mg/dL (ref 0.61–1.24)
GFR, Estimated: >60 mL/min (ref >60)
Glucose, Bld: 100 mg/dL — ABNORMAL HIGH (ref 70–99)
Potassium: 3.8 mmol/L (ref 3.5–5.1)
Sodium: 137 mmol/L (ref 135–145)

## 2022-12-27 LAB — CBC
HCT: 30.6 % — ABNORMAL LOW (ref 39.0–52.0)
Hemoglobin: 9.7 g/dL — ABNORMAL LOW (ref 13.0–17.0)
MCH: 28 pg (ref 26.0–34.0)
MCHC: 31.7 g/dL (ref 30.0–36.0)
MCV: 88.2 fL (ref 80.0–100.0)
Platelets: 269 10*3/uL (ref 150–400)
RBC: 3.47 MIL/uL — ABNORMAL LOW (ref 4.22–5.81)
RDW: 16.1 % — ABNORMAL HIGH (ref 11.5–15.5)
WBC: 7.5 10*3/uL (ref 4.0–10.5)
nRBC: 0 % (ref 0.0–0.2)

## 2022-12-27 NOTE — Progress Notes (Addendum)
  Progress Note    12/27/2022 7:56 AM 5 Days Post-Op  Subjective:  no complaints    Vitals:   12/26/22 2317 12/27/22 0357  BP: (!) 141/76 131/80  Pulse: 76 73  Resp:  19  Temp: 98.3 F (36.8 C) 98.1 F (36.7 C)  SpO2: 98% 100%    Physical Exam: General:  sitting in bed eating breakfast Cardiac:  regular Lungs:  nonlabored Incisions:  L groin with prevena with good seal. LLE incisions well appearing and dry Extremities:  brisk L DP doppler signal  CBC    Component Value Date/Time   WBC 7.5 12/27/2022 0444   RBC 3.47 (L) 12/27/2022 0444   HGB 9.7 (L) 12/27/2022 0444   HGB 11.7 (L) 12/12/2019 1006   HCT 30.6 (L) 12/27/2022 0444   HCT 36.2 (L) 12/12/2019 1006   PLT 269 12/27/2022 0444   PLT 249 12/12/2019 1006   MCV 88.2 12/27/2022 0444   MCV 90 12/12/2019 1006   MCH 28.0 12/27/2022 0444   MCHC 31.7 12/27/2022 0444   RDW 16.1 (H) 12/27/2022 0444   RDW 14.5 12/12/2019 1006   LYMPHSABS 1.2 11/10/2020 1350   LYMPHSABS 1.5 07/31/2019 1401   MONOABS 0.8 11/10/2020 1350   EOSABS 0.1 11/10/2020 1350   EOSABS 0.2 07/31/2019 1401   BASOSABS 0.0 11/10/2020 1350   BASOSABS 0.0 07/31/2019 1401    BMET    Component Value Date/Time   NA 137 12/27/2022 0444   NA 136 12/12/2019 1006   K 3.8 12/27/2022 0444   CL 105 12/27/2022 0444   CO2 28 12/27/2022 0444   GLUCOSE 100 (H) 12/27/2022 0444   BUN 14 12/27/2022 0444   BUN 18 12/12/2019 1006   CREATININE 0.94 12/27/2022 0444   CALCIUM 8.9 12/27/2022 0444   GFRNONAA >60 12/27/2022 0444   GFRAA 82 12/12/2019 1006    INR    Component Value Date/Time   INR 1.1 12/22/2022 0905     Intake/Output Summary (Last 24 hours) at 12/27/2022 0756 Last data filed at 12/26/2022 2317 Gross per 24 hour  Intake --  Output 400 ml  Net -400 ml      Assessment/Plan:  62 y.o. male is 5 days post op, s/p:  Left common femoral to below-knee popliteal artery bypass with ipsilateral greater saphenous vein    -L groin with  prevena vac with good seal. Will continue for several more days -LLE incisions well appearing and dry, no erythema or hematoma -LLE warm and well perfused with brisk DP doppler signal -Hemoglobin stable at 9.7 -Continues to make progress in his mobility. Walked the hallway again yesterday. Pending repeat PT/OT eval to see if he still needs SNF vs. [facility]   Loel Dubonnet PA-C [facility] [phone] 12/27/2022 7:56 AM   VASCULAR STAFF ADDENDUM: I have independently interviewed and examined the patient. I agree with the above.  HH vs. SNF pending PT evaluation. Keep VAC on groin until next Monday. If VAC fails as outpatient, will work into clinic to take dressing off.  Rande Brunt. Lenell Antu, MD [facility] Phone Number: [phone] 12/27/2022 8:34 AM

## 2022-12-27 NOTE — Progress Notes (Signed)
Physical Therapy Treatment Patient Details Name: [name]. MRN: [id number] DOB: 1960/11/16 Today's Date: 12/27/2022   History of Present Illness 62 yo male admitted 11/27 for Lt common femoral-pop BPG with post-op hematoma. PMHx: aortobifemoral BPG, T2DM, HLD, HTN, PVD, tobacco use.    PT Comments  Pt pleasant and agreeable to mobility. Pt demonstrated improved bed mobility with elevated HOB and use of rails. Pt tolerated increased ambulation distance with decreased assistance. Pt demonstrated safe ambulation with less restrictive AD moving from RW to [facility]. Pt demonstrated safe ambulation of stairs with supervision. Will continue to monitor acutely. Pt shows improved function and ability to safely enter home, would benefit from HHPT to further address functional deficits.   If plan is discharge home, recommend the following: Help with stairs or ramp for entrance;Assistance with cooking/housework;Assist for transportation;A little help with walking and/or transfers   Can travel by private vehicle     Yes  Equipment Recommendations  Rolling walker (2 wheels);BSC/3in1    Recommendations for Other Services       Precautions / Restrictions Precautions Precautions: Fall Precaution Comments: L groin wound vac Restrictions Weight Bearing Restrictions: No     Mobility  Bed Mobility Overal bed mobility: Modified Independent Bed Mobility: Supine to Sit     Supine to sit: Modified independent (Device/Increase time)     General bed mobility comments: Elevated HOB 30degrees, pt up to EOB no assist    Transfers Overall transfer level: Needs assistance Equipment used: Rolling walker (2 wheels), Straight cane Transfers: Sit to/from Stand Sit to Stand: Supervision           General transfer comment: from EOB with RW, into recliner with [facility]    Ambulation/Gait Ambulation/Gait assistance: Supervision Gait Distance (Feet): 300 Feet Assistive device: Rolling walker (2  wheels), Straight cane Gait Pattern/deviations: Step-through pattern, Decreased step length - right, Decreased step length - left, Decreased stride length, Decreased stance time - right, Decreased weight shift to left   Gait velocity interpretation: 1.31 - 2.62 ft/sec, indicative of limited community ambulator   General Gait Details: ambulated 116ft to stairs at supervision with RW, pt reported use of cane at home, returned to room with SPC, gait remained stable with SPC, pts cane seems large for him but reports it is comfortable   Stairs Stairs: Yes Stairs assistance: Supervision Stair Management: One rail Left, Step to pattern Number of Stairs: 12 General stair comments: use of rail, pt ascended and descended stairs safely with good pace   Wheelchair Mobility     Tilt Bed    Modified Rankin (Stroke Patients Only)       Balance Overall balance assessment: Needs assistance Sitting-balance support: Feet supported, Single extremity supported, Bilateral upper extremity supported Sitting balance-Leahy Scale: Good     Standing balance support: Single extremity supported, During functional activity, Bilateral upper extremity supported Standing balance-Leahy Scale: Fair                              Cognition Arousal: Alert Behavior During Therapy: WFL for tasks assessed/performed Overall Cognitive Status: Within Functional Limits for tasks assessed                                          Exercises General Exercises - Lower Extremity Long Arc Quad: AROM, Left, 10 reps, Seated  General Comments        Pertinent Vitals/Pain Pain Assessment Pain Assessment: 0-10 Pain Score: 8  Pain Location: L groin Pain Descriptors / Indicators: Sore Pain Intervention(s): Limited activity within patient's tolerance, Monitored during session    Home Living                          Prior Function            PT Goals (current goals can  now be found in the care plan section) Progress towards PT goals: Progressing toward goals    Frequency    Min 1X/week      PT Plan      Co-evaluation              AM-PAC PT "6 Clicks" Mobility   Outcome Measure  Help needed turning from your back to your side while in a flat bed without using bedrails?: A Little Help needed moving from lying on your back to sitting on the side of a flat bed without using bedrails?: A Little Help needed moving to and from a bed to a chair (including a wheelchair)?: A Little Help needed standing up from a chair using your arms (e.g., wheelchair or bedside chair)?: A Little Help needed to walk in hospital room?: A Little Help needed climbing 3-5 steps with a railing? : A Little 6 Click Score: 18    End of Session   Activity Tolerance: Patient tolerated treatment well Patient left: in chair;with call bell/phone within reach;with chair alarm set Nurse Communication: Mobility status PT Visit Diagnosis: Other abnormalities of gait and mobility (R26.89)     Time: 4540-9811 PT Time Calculation (min) (ACUTE ONLY): 17 min  Charges:    $Gait Training: 8-22 mins PT General Charges $$ ACUTE PT VISIT: 1 Visit                     Andrey Farmer SPT Secure chat preferred    Darlin Drop 12/27/2022, 10:15 AM

## 2022-12-27 NOTE — Progress Notes (Signed)
Occupational Therapy Treatment Patient Details Name: [name]. MRN: [id number] DOB: January 03, 1961 Today's Date: 12/27/2022   History of present illness 62 yo male admitted 11/27 for Lt common femoral-pop BPG with post-op hematoma. Wound vac applied to L groin 11/30. PMHx: aortobifemoral BPG, T2DM, HLD, HTN, PVD, tobacco use.   OT comments  Pt making excellent progress towards OT goals w/ L groin pain more controlled today. Pt able to demonstrate LB ADLs and bathroom mobility using cane with no more than CGA. Educated re: wound vac mgmt with clothing and with mobility; pt verbalized understanding. Based on progress, updating DC recs to HHOT.      If plan is discharge home, recommend the following:  Assistance with cooking/housework;Help with stairs or ramp for entrance;Assist for transportation   Equipment Recommendations  None recommended by OT    Recommendations for Other Services      Precautions / Restrictions Precautions Precautions: Fall Precaution Comments: L groin wound vac Restrictions Weight Bearing Restrictions: No       Mobility Bed Mobility Overal bed mobility: Modified Independent Bed Mobility: Supine to Sit, Sit to Supine                Transfers Overall transfer level: Needs assistance Equipment used: Straight cane Transfers: Sit to/from Stand Sit to Stand: Supervision           General transfer comment: from bed and toilet with SPC     Balance Overall balance assessment: Needs assistance Sitting-balance support: Feet supported, Single extremity supported, Bilateral upper extremity supported Sitting balance-Leahy Scale: Good     Standing balance support: Single extremity supported, During functional activity Standing balance-Leahy Scale: Fair                             ADL either performed or assessed with clinical judgement   ADL Overall ADL's : Needs assistance/impaired                     Lower Body  Dressing: Contact guard assist;Sit to/from stand;Sitting/lateral leans Lower Body Dressing Details (indicate cue type and reason): able to bend to reach feet more easily today, discussed wound vac mgmt with clothing and easier clothing to wear Toilet Transfer: Contact guard assist;Ambulation Toilet Transfer Details (indicate cue type and reason): using cane, able to transfer from low toilet (to simulate home environment) without issues. able to note when wound vac cord close to feet and OT assisted to adjust to decrease fall risk Toileting- Clothing Manipulation and Hygiene: Contact guard assist;Sit to/from stand;Sitting/lateral lean       Functional mobility during ADLs: Contact guard assist;Cane General ADL Comments: Able to use baseline cane today with pain much improved. educated on wound vac mgmt on how to carry it on his person and mgmt with clothing    Extremity/Trunk Assessment Upper Extremity Assessment Upper Extremity Assessment: Overall WFL for tasks assessed;Right hand dominant   Lower Extremity Assessment Lower Extremity Assessment: Defer to PT evaluation        Vision   Vision Assessment?: No apparent visual deficits   Perception     Praxis      Cognition Arousal: Alert Behavior During Therapy: WFL for tasks assessed/performed Overall Cognitive Status: Within Functional Limits for tasks assessed  Exercises      Shoulder Instructions       General Comments      Pertinent Vitals/ Pain       Pain Assessment Pain Assessment: Faces Faces Pain Scale: Hurts a little bit Pain Location: L groin Pain Descriptors / Indicators: Sore Pain Intervention(s): Monitored during session, Limited activity within patient's tolerance  Home Living                                          Prior Functioning/Environment              Frequency  Min 1X/week        Progress Toward  Goals  OT Goals(current goals can now be found in the care plan section)  Progress towards OT goals: Progressing toward goals  Acute Rehab OT Goals Patient Stated Goal: home soon OT Goal Formulation: With patient Time For Goal Achievement: 01/06/23 Potential to Achieve Goals: Good ADL Goals Pt Will Perform Lower Body Bathing: with modified independence;sit to/from stand;sitting/lateral leans Pt Will Perform Lower Body Dressing: with modified independence;sit to/from stand;sitting/lateral leans Pt Will Transfer to Toilet: with supervision;ambulating  Plan      Co-evaluation                 AM-PAC OT "6 Clicks" Daily Activity     Outcome Measure   Help from another person eating meals?: None Help from another person taking care of personal grooming?: None Help from another person toileting, which includes using toliet, bedpan, or urinal?: A Little Help from another person bathing (including washing, rinsing, drying)?: A Little Help from another person to put on and taking off regular upper body clothing?: None Help from another person to put on and taking off regular lower body clothing?: A Little 6 Click Score: 21    End of Session    OT Visit Diagnosis: Unsteadiness on feet (R26.81);Other abnormalities of gait and mobility (R26.89);Muscle weakness (generalized) (M62.81)   Activity Tolerance Patient tolerated treatment well   Patient Left in bed;with call bell/phone within reach;with bed alarm set   Nurse Communication Mobility status        Time: 1610-9604 OT Time Calculation (min): 14 min  Charges: OT General Charges $OT Visit: 1 Visit OT Treatments $Self Care/Home Management : 8-22 mins  Bradd Canary, OTR/L Acute Rehab Services Office: [phone]   Lorre Munroe 12/27/2022, 10:07 AM

## 2022-12-27 NOTE — Care Management Important Message (Signed)
Important Message  Patient Details  Name: [name]. MRN: [id number] Date of Birth: October 20, 1960   Important Message Given:  Yes - Medicare IM     Sherilyn Banker 12/27/2022, 4:21 PM

## 2022-12-27 NOTE — Plan of Care (Signed)
  Problem: Activity: Goal: Ability to tolerate increased activity will improve Outcome: Progressing   Problem: Clinical Measurements: Goal: Postoperative complications will be avoided or minimized Outcome: Progressing

## 2022-12-27 NOTE — Plan of Care (Signed)
  Problem: Education: Goal: Knowledge of prescribed regimen will improve Outcome: Progressing   Problem: Activity: Goal: Ability to tolerate increased activity will improve Outcome: Progressing   

## 2022-12-28 ENCOUNTER — Other Ambulatory Visit (HOSPITAL_COMMUNITY): Payer: Self-pay

## 2022-12-28 MED ORDER — ATORVASTATIN CALCIUM 80 MG PO TABS: 80.0000 mg | ORAL_TABLET | Freq: Every day | ORAL | 1 refills | Status: AC

## 2022-12-28 MED ORDER — OXYCODONE HCL 5 MG PO TABS
5.0000 mg | ORAL_TABLET | Freq: Four times a day (QID) | ORAL | 0 refills | Status: DC | PRN
  Filled 2022-12-28: qty 30, 8d supply, fill #0

## 2022-12-28 NOTE — Plan of Care (Signed)
Discharging home today with Prevena Wound Vac in place.  Discharge instructions reviewed with patient by Dayton Bailiff., RN.   Problem: Education: Goal: Knowledge of prescribed regimen will improve Outcome: Adequate for Discharge   Problem: Activity: Goal: Ability to tolerate increased activity will improve Outcome: Adequate for Discharge   Problem: Bowel/Gastric: Goal: Gastrointestinal status for postoperative course will improve Outcome: Adequate for Discharge   Problem: Clinical Measurements: Goal: Postoperative complications will be avoided or minimized Outcome: Adequate for Discharge Goal: Signs and symptoms of graft occlusion will improve Outcome: Adequate for Discharge   Problem: Skin Integrity: Goal: Demonstration of wound healing without infection will improve Outcome: Adequate for Discharge   Problem: Education: Goal: Knowledge of General Education information will improve Description: Including pain rating scale, medication(s)/side effects and non-pharmacologic comfort measures Outcome: Adequate for Discharge   Problem: Health Behavior/Discharge Planning: Goal: Ability to manage health-related needs will improve Outcome: Adequate for Discharge   Problem: Clinical Measurements: Goal: Ability to maintain clinical measurements within normal limits will improve Outcome: Adequate for Discharge Goal: Will remain free from infection Outcome: Adequate for Discharge Goal: Diagnostic test results will improve Outcome: Adequate for Discharge Goal: Respiratory complications will improve Outcome: Adequate for Discharge Goal: Cardiovascular complication will be avoided Outcome: Adequate for Discharge   Problem: Activity: Goal: Risk for activity intolerance will decrease Outcome: Adequate for Discharge   Problem: Nutrition: Goal: Adequate nutrition will be maintained Outcome: Adequate for Discharge   Problem: Coping: Goal: Level of anxiety will decrease Outcome: Adequate  for Discharge   Problem: Elimination: Goal: Will not experience complications related to bowel motility Outcome: Adequate for Discharge Goal: Will not experience complications related to urinary retention Outcome: Adequate for Discharge   Problem: Pain Management: Goal: General experience of comfort will improve Outcome: Adequate for Discharge   Problem: Safety: Goal: Ability to remain free from injury will improve Outcome: Adequate for Discharge   Problem: Skin Integrity: Goal: Risk for impaired skin integrity will decrease Outcome: Adequate for Discharge   Problem: Education: Goal: Knowledge of prescribed regimen will improve Outcome: Adequate for Discharge   Problem: Activity: Goal: Ability to tolerate increased activity will improve Outcome: Adequate for Discharge   Problem: Bowel/Gastric: Goal: Gastrointestinal status for postoperative course will improve Outcome: Adequate for Discharge   Problem: Clinical Measurements: Goal: Postoperative complications will be avoided or minimized Outcome: Adequate for Discharge Goal: Signs and symptoms of graft occlusion will improve Outcome: Adequate for Discharge   Problem: Skin Integrity: Goal: Demonstration of wound healing without infection will improve Outcome: Adequate for Discharge

## 2022-12-28 NOTE — Progress Notes (Signed)
Discharge instructions reviewed with pt.  Copy of instructions given to pt. [facility] has filled 1 script for pt and will be picked up on the way out for discharge, pt has another med being filled by his mail order, pt has some of the medication at home and knows how much to take of current medication until mail order dose comes to him. Pt's ride is here at main entrance. Pt has prevena vac on and knows how to care for and when he can removed it per MD instructions verbally communicated to pt and hand written by this RN on his instructions.  Pt d/c'd via wheelchair with belongings, and escorted by staff.   Nathalya Wolanski,RN SWOT

## 2022-12-28 NOTE — Progress Notes (Signed)
[facility]  Subjective  - doing well   Objective 138/68 75 (!) 97.5 F (36.4 C) (Oral) 20 99%  Intake/Output Summary (Last 24 hours) at 12/28/2022 9562 Last data filed at 12/28/2022 0431 Gross per 24 hour  Intake 240 ml  Output 1200 ml  Net -960 ml    Doppler signal PT/DP/Peroneal left LE Palpable bypass graft at the medial knee Right groin incisional vac with 5 days left until it expires Lungs non labored breathing   Assessment/Planning: 62 y.o. male is 6 days post op, s/p:  Left common femoral to below-knee popliteal artery bypass with ipsilateral greater saphenous vein   He will maintain the incisional vac until next Monday.  I discussed that once the green dots go away he can remove the dressing and throw everything away.   PT/OT HH ordered.  F/U in 1-2 weeks for incision check.      [name] 12/28/2022 7:22 AM --  Laboratory Lab Results: Recent Labs    12/26/22 0345 12/27/22 0444  WBC 6.4 7.5  HGB 9.4* 9.7*  HCT 29.8* 30.6*  PLT 227 269   BMET Recent Labs    12/26/22 0345 12/27/22 0444  NA 138 137  K 3.8 3.8  CL 106 105  CO2 26 28  GLUCOSE 95 100*  BUN 12 14  CREATININE 0.94 0.94  CALCIUM 8.8* 8.9    COAG Lab Results  Component Value Date   INR 1.1 12/22/2022   INR 1.1 10/09/2020   INR 1.1 10/05/2020   No results found for: "PTT"

## 2022-12-28 NOTE — Progress Notes (Signed)
Nursing Discharge Note   Name: [name]. MRN: [id number] DOB: [birth year]    Admit Date:  12/22/2022  Discharge Date:  12/28/2022   Ruben Cornea Laury Deep. is to be discharged home per MD order.  AVS completed. Reviewed with patient at bedside by Discharge Nurse Tammy P. RN. Highlighted copy provided for patient to take home.  Patient able to verbalize understanding of discharge instructions. PIV removed. Patient stable upon discharge.     Discharge Instructions        [facility]  Discharge instructions  Lower Extremity Bypass Surgery  Please refer to the following instruction for your post-procedure care. Your surgeon or physician assistant will discuss any changes with you.  Activity  You are encouraged to walk as much as you can. You can slowly return to normal activities during the month after your surgery. Avoid strenuous activity and heavy lifting until your doctor tells you it's OK. Avoid activities such as vacuuming or swinging a golf club. Do not drive until your doctor give the OK and you are no longer taking prescription pain medications. It is also normal to have difficulty with sleep habits, eating and bowel movement after surgery. These will go away with time.  Bathing/Showering  Shower daily after you go home. Do not soak in a bathtub, hot tub, or swim until the incision heals completely.  Incision Care  Clean your incision with mild soap and water. Shower every day. Pat the area dry with a clean towel. You do not need a bandage unless otherwise instructed. Do not apply any ointments or creams to your incision. If you have open wounds you will be instructed how to care for them or a visiting nurse may be arranged for you. If you have staples or sutures along your incision they will be removed at your post-op appointment. You may have skin glue on your incision. Do not peel it off. It will come off on its own in about one  week.  Wash the groin wound with soap and water daily and pat dry. (No tub bath-only shower)  Then put a dry gauze or washcloth in the groin to keep this area dry to help prevent wound infection.  Do this daily and as needed.  Do not use Vaseline or neosporin on your incisions.  Only use soap and water on your incisions and then protect and keep dry.  Diet  Resume your normal diet. There are no special food restrictions following this procedure. A low fat/ low cholesterol diet is recommended for all patients with vascular disease. In order to heal from your surgery, it is CRITICAL to get adequate nutrition. Your body requires vitamins, minerals, and protein. Vegetables are the best source of vitamins and minerals. Vegetables also provide the perfect balance of protein. Processed food has little nutritional value, so try to avoid this.  Medications  Resume taking all your medications unless your doctor or physician assistant tells you not to. If your incision is causing pain, you may take over-the-counter pain relievers such as acetaminophen (Tylenol). If you were prescribed a stronger pain medication, please aware these medication can cause nausea and constipation. Prevent nausea by taking the medication with a snack or meal. Avoid constipation by drinking plenty of fluids and eating foods with high amount of fiber, such as fruits, vegetables, and grains. Take Colace 100 mg (an over-the-counter stool softener) twice a day as needed for constipation.  Do not take Tylenol if  you are taking prescription pain medications.  Follow Up  Our office will schedule a follow up appointment 2-3 weeks following discharge.  Please call us immediately for any of the following conditions  Severe or worsening pain in your legs or feet while at rest or while walking Increase pain, redness, warmth, or drainage (pus) from your incision site(s) Fever of 101 degree or higher The swelling in your leg with the bypass  suddenly worsens and becomes more painful than when you were in the hospital If you have been instructed to feel your graft pulse then you should do so every day. If you can no longer feel this pulse, call the office immediately. Not all patients are given this instruction.  Leg swelling is common after leg bypass surgery.  The swelling should improve over a few months following surgery. To improve the swelling, you may elevate your legs above the level of your heart while you are sitting or resting. Your surgeon or physician assistant may ask you to apply an ACE wrap or wear compression (TED) stockings to help to reduce swelling.  Reduce your risk of vascular disease  Stop smoking. If you would like help call [facility] at 1-800-QUIT-NOW (463-359-1561) or [place] at 205 460 8781.  Manage your cholesterol Maintain a desired weight Control your diabetes weight Control your diabetes Keep your blood pressure down  If you have any questions, please call the office at [phone]       Discharge Instructions     Call MD for:  redness, tenderness, or signs of infection (pain, swelling, bleeding, redness, odor or green/yellow discharge around incision site)   Complete by: As directed    Call MD for:  severe or increased pain, loss or decreased feeling  in affected limb(s)   Complete by: As directed    Call MD for:  temperature >100.5   Complete by: As directed    Increase activity slowly   Complete by: As directed    Walk with assistance use walker or cane as needed   May shower    Complete by: As directed    Once the purple groin vac dressing is removed you may shower   Resume previous diet   Complete by: As directed         Allergies as of 12/28/2022   No Known Allergies      Medication List     TAKE these medications    aspirin EC 81 MG tablet Take 1 tablet (81 mg total) by mouth daily. Swallow whole.   atorvastatin 80 MG tablet Commonly known as:  LIPITOR Take 1 tablet (80 mg total) by mouth at bedtime. What changed:  medication strength how much to take   Blood Pressure Monitor Kit Check blood pressure daily What changed:  how much to take how to take this when to take this additional instructions   DULoxetine 30 MG capsule Commonly known as: CYMBALTA Take 30 mg by mouth daily in the afternoon.   gabapentin 600 MG tablet Commonly known as: NEURONTIN Take 1 tablet (600 mg) in morning and afternoon. Take 2 tablets (1200 mg) at night What changed:  how much to take how to take this when to take this additional instructions   lisinopril 10 MG tablet Commonly known as: ZESTRIL Take 10 mg by mouth daily.   multivitamin with minerals Tabs tablet Take 1 tablet by mouth daily.   oxyCODONE 5 MG immediate release tablet Commonly known as: Oxy IR/ROXICODONE Take 1 tablet (5 mg  total) by mouth every 6 (six) hours as needed for moderate pain (pain score 4-6).   pantoprazole 20 MG tablet Commonly known as: PROTONIX Take 20 mg by mouth daily in the afternoon.   Vitamin D3 Ultra Potency 1250 MCG Tabs Generic drug: Cholecalciferol Take 50,000 Units by mouth once a week. Thursdays or Fridays         Discharge Instructions/ Education: An After Visit Summary was printed and given to the patient. Discharge instructions given to patient with verbalized understanding. Discharge education completed with patient/family including: follow up instructions, medication list, discharge activities, and limitations if indicated.  Additional discharge instructions as indicated by discharging provider also reviewed.  Patient and family able to verbalize understanding, all questions fully answered. Patient instructed to return to Emergency Department, call 911, or call MD for any changes in condition.   Patient escorted via wheelchair to lobby and discharged home via private automobile.

## 2022-12-28 NOTE — TOC Transition Note (Signed)
Transition of Care (TOC) - CM/SW Discharge Note Donn Pierini RN, BSN Transitions of Care Unit 4E- RN Case Manager See Treatment Team for direct phone #   Patient Details  Name: [name]. MRN: [id number] Date of Birth: Sep 22, 1960  Transition of Care [facility]) CM/SW Contact:  Darrold Span, RN Phone Number: 12/28/2022, 10:06 AM   Clinical Narrative:    Pt stable for transition home today, HHPT/OT orders placed.  Pt has no preference for [facility] provider as per previous conversation.  Referral made to [facility] for [facility] needs- liaison has accepted referral and will f/u for start of care.   Pt's transportation will be here around 11am.   No further TOC needs noted.    Final next level of care: Home w Home Health Services Barriers to Discharge: Barriers Resolved   Patient Goals and CMS Choice CMS Medicare.gov Compare Post Acute Care list provided to:: Patient Choice offered to / list presented to : Patient  Discharge Placement                 Home w/ [facility]        Discharge Plan and Services Additional resources added to the After Visit Summary for     Discharge Planning Services: CM Consult Post Acute Care Choice: Home Health          DME Arranged: N/A DME Agency: NA       HH Arranged: PT, OT HH Agency: [facility] Date [facility] Agency Contacted: 12/28/22 Time HH Agency Contacted: 1006 Representative spoke with at [facility] Agency: Kandee Keen  Social Determinants of Health (SDOH) Interventions SDOH Screenings   Food Insecurity: No Food Insecurity (12/23/2022)  Housing: Low Risk  (12/23/2022)  Transportation Needs: No Transportation Needs (12/23/2022)  Utilities: Not At Risk (12/23/2022)  Depression (PHQ2-9): Low Risk  (10/20/2018)  Tobacco Use: High Risk (12/22/2022)     Readmission Risk Interventions    12/28/2022   10:06 AM 10/29/2020    2:38 PM 10/14/2020    5:02 PM  Readmission Risk Prevention Plan  Transportation Screening Complete Complete  Complete  PCP or Specialist Appt within 5-7 Days   Not Complete  Not Complete comments   per vascular post op appointment 11/04/2020  PCP or Specialist Appt within 3-5 Days  Complete   Home Care Screening Complete  Complete  Medication Review (RN CM) Complete  Complete  HRI or Home Care Consult  Complete   Social Work Consult for Recovery Care Planning/Counseling  Complete   Palliative Care Screening  Not Applicable   Medication Review Ocean[employer])  Complete

## 2022-12-29 NOTE — Discharge Summary (Signed)
[facility] Discharge Summary   Patient ID:  [name]. MRN: [id number] DOB/AGE: [birth year] 62 y.o.  Admit date: 12/22/2022 Discharge date: 12/28/22 Date of Surgery: 12/22/2022 Surgeon: Surgeon(s): [name], MD Cephus Shelling, MD  Admission Diagnosis: Femoral-popliteal bypass graft occlusion, left [facility]) [V42.595G] PAD (peripheral artery disease) (HCC) [I73.9]  Discharge Diagnoses:  Femoral-popliteal bypass graft occlusion, left (HCC) [T82.898A] PAD (peripheral artery disease) (HCC) [I73.9]  Secondary Diagnoses: Past Medical History:  Diagnosis Date   Diabetes mellitus without complication (HCC)    Hyperlipidemia    Hypertension    Peripheral vascular disease (HCC)    Tobacco use     Procedure(s): LEFT COMMON FEMORAL-BELOW KNEE ARTERY BYPASS WITH SAPHENOUS VEIN HARVEST  Discharged Condition: stable  HPI: [name] is a 62 year old male who has critical limb ischemia with rest pain of the left foot. Angiogram was diagnostic in nature.   He was brought in today for left common femoral to below knee popliteal artery bypass with ipsilateral, reversed greater saphenous vein in a subcutaneous tunnel.   Hospital Course:  Jasyn Marra. is a 62 y.o. male is S/P  Procedure(s): LEFT COMMON FEMORAL-BELOW KNEE ARTERY BYPASS WITH SAPHENOUS VEIN HARVEST Left groin developed small hematoma that resolved with time.   LLE warm well-perfused with brisk AT Doppler signal.  POD # 2 L groin with small area of central incision dehiscence with SS drainage. Will place prevena vac today.  Has had limited mobility so far and therapy is recommending SNF. Discussed he needs to mobilize today.  With time he made better progress and was discharged home with [facility] PT/OT 12/28/22. Lipitor was increased from 40 mg to 80 mg daily.  He was discharged on ASA daily as well.   Significant Diagnostic Studies: CBC Lab Results  Component Value Date   WBC  7.5 12/27/2022   HGB 9.7 (L) 12/27/2022   HCT 30.6 (L) 12/27/2022   MCV 88.2 12/27/2022   PLT 269 12/27/2022    BMET    Component Value Date/Time   NA 137 12/27/2022 0444   NA 136 12/12/2019 1006   K 3.8 12/27/2022 0444   CL 105 12/27/2022 0444   CO2 28 12/27/2022 0444   GLUCOSE 100 (H) 12/27/2022 0444   BUN 14 12/27/2022 0444   BUN 18 12/12/2019 1006   CREATININE 0.94 12/27/2022 0444   CALCIUM 8.9 12/27/2022 0444   GFRNONAA >60 12/27/2022 0444   GFRAA 82 12/12/2019 1006   COAG Lab Results  Component Value Date   INR 1.1 12/22/2022   INR 1.1 10/09/2020   INR 1.1 10/05/2020     Disposition:  Discharge to :Home Discharge Instructions     Call MD for:  redness, tenderness, or signs of infection (pain, swelling, bleeding, redness, odor or green/yellow discharge around incision site)   Complete by: As directed    Call MD for:  severe or increased pain, loss or decreased feeling  in affected limb(s)   Complete by: As directed    Call MD for:  temperature >100.5   Complete by: As directed    Increase activity slowly   Complete by: As directed    Walk with assistance use walker or cane as needed   May shower    Complete by: As directed    Once the purple groin vac dressing is removed you may shower   Resume previous diet   Complete by: As directed       Allergies as  of 12/28/2022   No Known Allergies      Medication List     TAKE these medications    aspirin EC 81 MG tablet Take 1 tablet (81 mg total) by mouth daily. Swallow whole.   atorvastatin 80 MG tablet Commonly known as: LIPITOR Take 1 tablet (80 mg total) by mouth at bedtime. What changed:  medication strength how much to take   Blood Pressure Monitor Kit Check blood pressure daily What changed:  how much to take how to take this when to take this additional instructions   DULoxetine 30 MG capsule Commonly known as: CYMBALTA Take 30 mg by mouth daily in the afternoon.   gabapentin  600 MG tablet Commonly known as: NEURONTIN Take 1 tablet (600 mg) in morning and afternoon. Take 2 tablets (1200 mg) at night What changed:  how much to take how to take this when to take this additional instructions   lisinopril 10 MG tablet Commonly known as: ZESTRIL Take 10 mg by mouth daily.   multivitamin with minerals Tabs tablet Take 1 tablet by mouth daily.   oxyCODONE 5 MG immediate release tablet Commonly known as: Oxy IR/ROXICODONE Take 1 tablet (5 mg total) by mouth every 6 (six) hours as needed for moderate pain (pain score 4-6).   pantoprazole 20 MG tablet Commonly known as: PROTONIX Take 20 mg by mouth daily in the afternoon.   Vitamin D3 Ultra Potency 1250 MCG Tabs Generic drug: Cholecalciferol Take 50,000 Units by mouth once a week. Thursdays or Fridays       Verbal and written Discharge instructions given to the patient. Wound care per Discharge AVS  Follow-up Information     [name], MD Follow up in 2 week(s).   Specialties: Vascular Surgery, Interventional Cardiology Why: Office will call you to arrange your appt (sent) Contact information: [address] [place] [address] [phone]         Care, [facility] Follow up.   Specialty: Home Health Services Why: HHPT/OT arranged- they will contact you to schedule Contact information: [address] [place] [address] [phone]                 Signed: Mosetta Pigeon 12/29/2022, 8:17 AM - For VQI Registry use --- Instructions: Press F2 to tab through selections.  Delete question if not applicable.   Post-op:  Wound infection: No  Graft infection: No  Transfusion: No  If yes, 0 units given New Arrhythmia: No Ipsilateral amputation: [x ] no, [ ]  Minor, [ ]  BKA, [ ]  AKA Discharge patency: [x ] Primary, [ ]  Primary assisted, [ ]  Secondary, [ ]  Occluded Patency judged by: [ x] Dopper only, [ ]  Palpable graft pulse, [ ]  Palpable distal pulse,  [ ]  ABI inc. > 0.15, [ ]  Duplex  D/C Ambulatory Status: Ambulatory  Complications: MI: [x ] No, [ ]  Troponin only, [ ]  EKG or Clinical CHF: No Resp failure: [x ] none, [ ]  Pneumonia, [ ]  Ventilator Chg in renal function: [ ]  none, [ ]  Inc. Cr > 0.5, [ ]  Temp. Dialysis, [ ]  Permanent dialysis Stroke: [x ] None, [ ]  Minor, [ ]  Major Return to OR: No  Reason for return to OR: [ ]  Bleeding, [ ]  Infection, [ ]  Thrombosis, [ ]  Revision  Discharge medications: Statin use:  Yes ASA use:  Yes Plavix use:  No  for medical reason not indicated Beta blocker use: No  for medical  reason not indicated Coumadin use: No  for medical reason not indicated

## 2023-01-04 ENCOUNTER — Telehealth: Payer: Self-pay

## 2023-01-04 NOTE — Telephone Encounter (Signed)
[name], PT w [facility] given VO for [facility], per his request for pt. He is currently with pt.

## 2023-01-04 NOTE — Telephone Encounter (Signed)
Spoke with both pt and Arminda Resides PT who was there seeing pt. Pt canceled his incision check today. This has been r/s to next monday. He was unable to r/s tomorrow or Thursday, due to lack of transportation. Pt is currently in 8/10 pain and has some bloody drainage from groin incision site. He states this started about 2 days ago and is lessening. He has several Oxycodone left and had been taking 2 at a time. He has been advised to try taking XS Tylenol and if that does not alleviate pain, he can take one Oxycodone at a time. Per PT, pt has very limited resources. PT has requested an order for [facility] RN to be entered.I have let him know we will work on setting this up. Pt also advised to keep incision clean and dry and to call us if the bleeding persists.

## 2023-01-07 ENCOUNTER — Telehealth: Payer: Self-pay

## 2023-01-07 ENCOUNTER — Ambulatory Visit (INDEPENDENT_AMBULATORY_CARE_PROVIDER_SITE_OTHER): Payer: Medicare HMO | Admitting: Physician Assistant

## 2023-01-07 VITALS — BP 153/79 | HR 92 | Temp 97.5°F | Resp 16 | Ht 62.0 in | Wt 112.5 lb

## 2023-01-07 DIAGNOSIS — I70222 Atherosclerosis of native arteries of extremities with rest pain, left leg: Secondary | ICD-10-CM

## 2023-01-07 DIAGNOSIS — I739 Peripheral vascular disease, unspecified: Secondary | ICD-10-CM

## 2023-01-07 MED ORDER — OXYCODONE HCL 5 MG PO TABS: 5.0000 mg | ORAL_TABLET | ORAL | 0 refills | Status: DC | PRN

## 2023-01-07 NOTE — Progress Notes (Signed)
POST OPERATIVE OFFICE NOTE    CC:  F/u for surgery  HPI:  This is a 62 y.o. male who is s/p left common to below the knee popliteal bypass with vein by Dr. Lenell Antu on 12/22/2022 due to critical limb ischemia of the left leg with rest pain.  He has history of aortobifemoral bypass graft by Dr. Lenell Antu in 2022.  Rest pain of the left foot has resolved since surgery.  He believes his incisions are healing well.  He is still requiring pain medication to sleep and to increase his mobility during the day.  He is on a statin daily.  He also takes a aspirin daily.  It should also be noted that he has a right dorsal foot wound caused by cortisone injections about a year ago.  He has a known right SFA occlusion.  No Known Allergies  Current Outpatient Medications  Medication Sig Dispense Refill   atorvastatin (LIPITOR) 80 MG tablet Take 1 tablet (80 mg total) by mouth at bedtime. 30 tablet 1   Blood Pressure Monitor KIT Check blood pressure daily (Patient taking differently: 1 each by Other route once a week.) 1 kit 0   Cholecalciferol (VITAMIN D3 ULTRA POTENCY) 1.25 MG (50000 UT) TABS Take 50,000 Units by mouth once a week. Thursdays or Fridays     DULoxetine (CYMBALTA) 30 MG capsule Take 30 mg by mouth daily in the afternoon.     gabapentin (NEURONTIN) 600 MG tablet Take 1 tablet (600 mg) in morning and afternoon. Take 2 tablets (1200 mg) at night (Patient taking differently: Take 600 mg by mouth 3 (three) times daily.) 120 tablet 0   lisinopril (ZESTRIL) 10 MG tablet Take 10 mg by mouth daily.     oxyCODONE (OXY IR/ROXICODONE) 5 MG immediate release tablet Take 1 tablet (5 mg total) by mouth every 6 (six) hours as needed for moderate pain (pain score 4-6). 30 tablet 0   pantoprazole (PROTONIX) 20 MG tablet Take 20 mg by mouth daily in the afternoon.     aspirin EC 81 MG tablet Take 1 tablet (81 mg total) by mouth daily. Swallow whole. (Patient not taking: Reported on 01/07/2023)     Multiple Vitamin  (MULTIVITAMIN WITH MINERALS) TABS tablet Take 1 tablet by mouth daily. (Patient not taking: Reported on 01/07/2023)     Current Facility-Administered Medications  Medication Dose Route Frequency Provider Last Rate Last Admin   sodium chloride flush (NS) 0.9 % injection 3 mL  3 mL Intravenous Q12H Runell Gess, MD         ROS:  See HPI  Physical Exam:  Vitals:   01/07/23 0952  BP: (!) 153/79  Pulse: 92  Resp: 16  Temp: (!) 97.5 F (36.4 C)  TempSrc: Temporal  SpO2: 96%  Weight: 112 lb 8 oz (51 kg)  Height: 5\' 2"  (1.575 m)    Incision: Incisions of the left lower extremity are healing well; left groin with some fullness but no drainage or sign of infection Extremities: Edematous left foot and ankle but brisk DP and PT signals by Doppler Neuro: A&O  Assessment/Plan:  This is a 62 y.o. male who is s/p: Left femoral to below the knee popliteal bypass with vein due to ischemic rest pain of the left foot  Left foot rest pain has resolved since surgery.  Left foot is well-perfused with brisk DP and PT signals by Doppler.  Bypass is also palpable at the medial knee.  He has edema of the  left foot and ankle however reassured him that this is expected after bypass surgery.  He continues however to be in pain related to his surgery.  I will refill his narcotic prescription.  Incisions are also healing well.  I encouraged him to cleanse his left groin with soap and water on a daily basis then pat dry.  He will continue his aspirin and statin daily.  Encouraged him to increase his mobility to speed the healing process.  He is scheduled for bypass duplex and ABI with Dr. Lenell Antu in a couple weeks.  Patient has a known right SFA occlusion as well.  He has a dorsal foot wound on the right foot caused by cortisone injections about a year ago.  He is aware he may require outflow surgery on the right leg as well.  We will let him recover from his current surgery before considering any further  surgery.   Emilie Rutter, PA-C [facility] [phone]  Clinic MD:  Hetty Blend

## 2023-01-07 NOTE — Telephone Encounter (Signed)
April, nurse with [facility] called to get VO for skilled nurse for pt for 3 weeks. She was given this and has no further questions/concerns. Pt has a f/u here scheduled for this morning.

## 2023-01-11 ENCOUNTER — Other Ambulatory Visit: Payer: Self-pay

## 2023-01-11 DIAGNOSIS — I739 Peripheral vascular disease, unspecified: Secondary | ICD-10-CM

## 2023-01-24 NOTE — Progress Notes (Deleted)
 [facility] [place]  ASSESSMENT / PLAN: [name]. is a 62 y.o. 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  1) left greater saphenous vein harvest 2) left common femoral to below knee popliteal artery bypass with ipsilateral, reversed greater saphenous vein in a subcutaneous tunnel  12/22/22  Recommend:  Abstinence from all tobacco products. Blood glucose control with goal A1c < 7%. Blood pressure control with goal blood pressure < 140/90 mmHg. Lipid reduction therapy with goal LDL-C <100 mg/dL  Aspirin  81mg  PO QD.  Atorvastatin  40-80mg  PO QD (or other high intensity statin therapy).  Plan left lower extremity angiogram via right common femoral approach in cath lab next available. Patient likely will need left femoral-peroneal bypass. Clinically, his greater saphenous vein appears appropriate for bypass graft.   CHIEF COMPLAINT: Left foot pain  HISTORY OF PRESENT ILLNESS: Ruben J Sammon Jr. is a 62 y.o. male well-known to me having undergone aortobifemoral bypass grafting for rest pain in September 2022.  Patient recovered from this nicely.  He was seen by one of our PAs reporting similar pain in his left foot.  Femoral pulses were nonpalpable at that time and so a CT angiogram was performed.  This shows widely patent aortobifemoral bypass graft with significant infrainguinal occlusive disease.  Today the patient reports his left foot is mostly bothersome to him.  The pain will awaken him from sleep.  He also describes numbness in his foot.  VASCULAR SURGICAL HISTORY:  Aortobifemoral bypass 10/09/20 Left SFA and common iliac stenting 06/10/20   Past Medical History:  Diagnosis Date   Diabetes mellitus without complication (HCC)    Hyperlipidemia    Hypertension    Peripheral vascular disease (HCC)    Tobacco use     Past Surgical History:  Procedure Laterality Date   ABDOMINAL  AORTOGRAM W/LOWER EXTREMITY Left 07/16/2019   Procedure: ABDOMINAL AORTOGRAM W/LOWER EXTREMITY;  Surgeon: Court Dorn JINNY, MD;  Location: [facility];  Service: Cardiovascular;  Laterality: Left;   ABDOMINAL AORTOGRAM W/LOWER EXTREMITY Left 08/16/2019   ABDOMINAL AORTOGRAM W/LOWER EXTREMITY N/A 08/16/2019   Procedure: ABDOMINAL AORTOGRAM W/LOWER EXTREMITY;  Surgeon: Court Dorn JINNY, MD;  Location: [facility];  Service: Cardiovascular;  Laterality: N/A;   ABDOMINAL AORTOGRAM W/LOWER EXTREMITY Bilateral 12/19/2019   Procedure: ABDOMINAL AORTOGRAM W/LOWER EXTREMITY;  Surgeon: Darron Deatrice LABOR, MD;  Location: [facility];  Service: Cardiovascular;  Laterality: Bilateral;   ABDOMINAL AORTOGRAM W/LOWER EXTREMITY Left 06/10/2020   Procedure: ABDOMINAL AORTOGRAM W/LOWER EXTREMITY;  Surgeon: Serene Gaile ORN, MD;  Location: [facility];  Service: Cardiovascular;  Laterality: Left;   ABDOMINAL AORTOGRAM W/LOWER EXTREMITY N/A 10/06/2020   Procedure: ABDOMINAL AORTOGRAM W/LOWER EXTREMITY;  Surgeon: Sheree Penne Bruckner, MD;  Location: [facility];  Service: Cardiovascular;  Laterality: N/A;   ABDOMINAL AORTOGRAM W/LOWER EXTREMITY Left 12/17/2022   Procedure: ABDOMINAL AORTOGRAM W/LOWER EXTREMITY;  Surgeon: Magda Debby SAILOR, MD;  Location: [facility];  Service: Cardiovascular;  Laterality: Left;   AMPUTATION TOE Left 06/13/2020   Procedure: Left Fifth Toe Amputation ;  Surgeon: Silva Juliene SAUNDERS, DPM;  Location: [facility] OR;  Service: Podiatry;  Laterality: Left;   AORTA - BILATERAL FEMORAL ARTERY BYPASS GRAFT Bilateral 10/09/2020   Procedure: AORTA BIFEMORAL BYPASS GRAFT;  Surgeon: Magda Debby SAILOR, MD;  Location: [facility];  Service: Vascular;  Laterality: Bilateral;   arm surgery Right    ENDARTERECTOMY FEMORAL  Bilateral 10/09/2020   Procedure: ENDARTERECTOMY COMMON FEMORAL;  Surgeon: Magda Debby SAILOR, MD;  Location: [facility] OR;  Service: Vascular;  Laterality:  Bilateral;   FEMORAL-POPLITEAL BYPASS GRAFT Left 12/22/2022   Procedure: LEFT COMMON FEMORAL-BELOW KNEE ARTERY BYPASS WITH SAPHENOUS VEIN HARVEST;  Surgeon: Magda Debby SAILOR, MD;  Location: [facility];  Service: Vascular;  Laterality: Left;   PERIPHERAL VASCULAR BALLOON ANGIOPLASTY  08/16/2019   Procedure: PERIPHERAL VASCULAR BALLOON ANGIOPLASTY;  Surgeon: Court Dorn PARAS, MD;  Location: [facility];  Service: Cardiovascular;;  attempted PTA of Left SFA   PERIPHERAL VASCULAR INTERVENTION Right 07/16/2019   Procedure: PERIPHERAL VASCULAR INTERVENTION;  Surgeon: Court Dorn PARAS, MD;  Location: [facility];  Service: Cardiovascular;  Laterality: Right;   PERIPHERAL VASCULAR INTERVENTION Right 12/19/2019   Procedure: PERIPHERAL VASCULAR INTERVENTION;  Surgeon: Darron Deatrice LABOR, MD;  Location: [facility];  Service: Cardiovascular;  Laterality: Right;  iliac   PERIPHERAL VASCULAR INTERVENTION Left 06/10/2020   Procedure: PERIPHERAL VASCULAR INTERVENTION;  Surgeon: Serene Gaile ORN, MD;  Location: [facility];  Service: Cardiovascular;  Laterality: Left;  SFA /COMMON ILIAC    Family History  Problem Relation Age of Onset   Cancer Mother    Diabetes Father     Social History   Socioeconomic History   Marital status: Legally Separated    Spouse name: Not on file   Number of children: Not on file   Years of education: Not on file   Highest education level: Not on file  Occupational History   Not on file  Tobacco Use   Smoking status: Every Day    Current packs/day: 0.50    Average packs/day: 0.5 packs/day for 40.0 years (20.0 ttl pk-yrs)    Types: Cigarettes   Smokeless tobacco: Never  Vaping Use   Vaping status: Never Used  Substance and Sexual Activity   Alcohol use: Yes    Comment: beer and liquor- maybe a few drinks on the weekends   Drug use: Yes    Types: Marijuana, Cocaine    Comment: pt states he has not used cocaine in about 5-6 months. Pt last  smoked marijuana today 12/20/22   Sexual activity: Not on file  Other Topics Concern   Not on file  Social History Narrative   Not on file   Social Drivers of Health   Financial Resource Strain: Not on file  Food Insecurity: No Food Insecurity (12/23/2022)   Hunger Vital Sign    Worried About Running Out of Food in the Last Year: Never true    Ran Out of Food in the Last Year: Never true  Transportation Needs: No Transportation Needs (12/23/2022)   PRAPARE - Administrator, Civil Service (Medical): No    Lack of Transportation (Non-Medical): No  Physical Activity: Not on file  Stress: Not on file  Social Connections: Not on file  Intimate Partner Violence: Not At Risk (12/23/2022)   Humiliation, Afraid, Rape, and Kick questionnaire    Fear of Current or Ex-Partner: No    Emotionally Abused: No    Physically Abused: No    Sexually Abused: No    No Known Allergies  Current Outpatient Medications  Medication Sig Dispense Refill   aspirin  EC 81 MG tablet Take 1 tablet (81 mg total) by mouth daily. Swallow whole. (Patient not taking: Reported on 01/07/2023)     atorvastatin  (LIPITOR ) 80 MG tablet Take 1 tablet (80 mg total) by mouth at  bedtime. 30 tablet 1   Blood Pressure Monitor KIT Check blood pressure daily (Patient taking differently: 1 each by Other route once a week.) 1 kit 0   Cholecalciferol (VITAMIN D3 ULTRA POTENCY) 1.25 MG (50000 UT) TABS Take 50,000 Units by mouth once a week. Thursdays or Fridays     DULoxetine  (CYMBALTA ) 30 MG capsule Take 30 mg by mouth daily in the afternoon.     gabapentin  (NEURONTIN ) 600 MG tablet Take 1 tablet (600 mg) in morning and afternoon. Take 2 tablets (1200 mg) at night (Patient taking differently: Take 600 mg by mouth 3 (three) times daily.) 120 tablet 0   lisinopril  (ZESTRIL ) 10 MG tablet Take 10 mg by mouth daily.     Multiple Vitamin (MULTIVITAMIN WITH MINERALS) TABS tablet Take 1 tablet by mouth daily. (Patient not  taking: Reported on 01/07/2023)     oxyCODONE  (OXY IR/ROXICODONE ) 5 MG immediate release tablet Take 1 tablet (5 mg total) by mouth every 4 (four) hours as needed for moderate pain (pain score 4-6). 30 tablet 0   pantoprazole  (PROTONIX ) 20 MG tablet Take 20 mg by mouth daily in the afternoon.     Current Facility-Administered Medications  Medication Dose Route Frequency Provider Last Rate Last Admin   sodium chloride  flush (NS) 0.9 % injection 3 mL  3 mL Intravenous Q12H Court Dorn PARAS, MD        PHYSICAL EXAM There were no vitals filed for this visit.   Thin, chronically ill man in no distress Regular rate and rhythm Unlabored breathing No palpable pedal pulses Well-healed left fifth toe amputation    PERTINENT LABORATORY AND RADIOLOGIC DATA  Most recent CBC    Latest Ref Rng & Units 12/27/2022    4:44 AM 12/26/2022    3:45 AM 12/24/2022    9:06 AM  CBC  WBC 4.0 - 10.5 K/uL 7.5  6.4  6.9   Hemoglobin 13.0 - 17.0 g/dL 9.7  9.4  89.9   Hematocrit 39.0 - 52.0 % 30.6  29.8  32.0   Platelets 150 - 400 K/uL 269  227  185      Most recent CMP    Latest Ref Rng & Units 12/27/2022    4:44 AM 12/26/2022    3:45 AM 12/24/2022    9:06 AM  CMP  Glucose 70 - 99 mg/dL 899  95  886   BUN 8 - 23 mg/dL 14  12  10    Creatinine 0.61 - 1.24 mg/dL 9.05  9.05  9.13   Sodium 135 - 145 mmol/L 137  138  138   Potassium 3.5 - 5.1 mmol/L 3.8  3.8  3.7   Chloride 98 - 111 mmol/L 105  106  109   CO2 22 - 32 mmol/L 28  26  24    Calcium  8.9 - 10.3 mg/dL 8.9  8.8  8.5     Renal function CrCl cannot be calculated (Patient's most recent lab result is older than the maximum 21 days allowed.).  Hgb A1c MFr Bld (%)  Date Value  10/06/2020 5.5    LDL Chol Calc (NIH)  Date Value Ref Range Status  01/16/2020 100 (H) 0 - 99 mg/dL Final   LDL Cholesterol  Date Value Ref Range Status  12/23/2022 86 0 - 99 mg/dL Final    Comment:           Total Cholesterol/HDL:CHD Risk Coronary Heart  Disease Risk Table  Men   Women  1/2 Average Risk   3.4   3.3  Average Risk       5.0   4.4  2 X Average Risk   9.6   7.1  3 X Average Risk  23.4   11.0        Use the calculated Patient Ratio above and the CHD Risk Table to determine the patient's CHD Risk.        ATP III CLASSIFICATION (LDL):  <100     mg/dL   Optimal  899-870  mg/dL   Near or Above                    Optimal  130-159  mg/dL   Borderline  839-810  mg/dL   High  >809     mg/dL   Very High Performed at [facility], [address] [place], [place] [address]      +-------+-----------+-----------+------------+------------+  ABI/TBIToday's ABIToday's TBIPrevious ABIPrevious TBI  +-------+-----------+-----------+------------+------------+  Right 0.59       0.31       0.81        absent        +-------+-----------+-----------+------------+------------+  Left  0.32       0.31       0.37        absent        +-------+-----------+-----------+------------+------------+     CT angiogram MPRESSION: Vascular Impression:   1. Stable sequela of previous open aorto bi femoral bypass grafting. The bypass graft appears widely patent without evidence of in stent stenosis. 2. Redemonstrated 50% luminal narrowing of the origin and proximal aspect of the SMA. 3. 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   Vascular Impression of the right lower extremity:   1. Interval occlusion of the right superficial femoral artery at the level of the proximal/mid femur, new compared to the 10/2020 examination. 2. Suspected 50% luminal narrowing involving the proximal aspect of the right superficial femoral artery. 3. Single-vessel runoff to the right lower leg and foot via the peroneal artery, similar to the 10/2020  examination. No evidence of distal embolism.   Vascular Impression of the left lower extremity:   1. 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 2. Single-vessel runoff to the left lower leg and foot via the left anterior tibial artery, similar to the 10/2020 examination.   Nonvascular Impression:   1. No acute findings within the abdomen or pelvis.  Debby SAILOR. Magda, MD [facility] Phone Number: [phone] 01/24/2023 2:32 PM   Total time spent on preparing this encounter including chart review, data review, collecting history, examining the patient, coordinating care for this established patient, 30 minutes.  Portions of this report may have been transcribed using voice recognition software.  Every effort has been made to ensure accuracy; however, inadvertent computerized transcription errors may still be present.

## 2023-01-25 ENCOUNTER — Ambulatory Visit (HOSPITAL_COMMUNITY): Payer: Medicare HMO

## 2023-01-25 ENCOUNTER — Encounter: Payer: Medicare HMO | Admitting: Vascular Surgery

## 2023-01-27 ENCOUNTER — Telehealth: Payer: Self-pay

## 2023-01-27 NOTE — Telephone Encounter (Signed)
 Caller: Signe Eastern, PT with [facility]  Concern: PT reports pt has had multiple falls, the most recent one yesterday on 01/26/23 while climbing steps. The pt stated that his L knee gave way which caused him to fall. He also reports that the pt has a homemade cane and he recommended the pt get a cane with a secure tip so it won't slip on the floor.   It's been 3 weeks since PT's last home visit. The pt is still reporting pain of 5-8/10 and has no more pain meds. Pt has swelling, warmth, and a dorsal lesion on his R foot approx 1/2 in diameter, not open.  Location: left leg, right leg  Treatments:  Instructed PT to f/u with pt on using Tylenol . He stated that he would not be going back to the home until next week and asked for triage to call.  Called pt to instruct him on Tylenol  use and if symptoms warranted, to schedule a triage appt for 01/28/23. No answer, vm full.

## 2023-01-31 LAB — COLOGUARD: COLOGUARD: NEGATIVE

## 2023-01-31 LAB — EXTERNAL GENERIC LAB PROCEDURE: COLOGUARD: NEGATIVE

## 2023-02-02 ENCOUNTER — Telehealth: Payer: Self-pay

## 2023-02-02 NOTE — Telephone Encounter (Signed)
 Triage:  Burnard, PT from [facility] called to f/u on patient requesting pain medicine and/or get advise on how to handle the pain.  Discussed the relation of his pain to his recent falls and if that is a contribution, that he may use tylenol  as directed.   Attempted to contact pt to discuss tylenol  use and if his leg pain from surgery is not improving, he could come in and see the doctor.  Unfortunately, there was no answer and VM noted full.  PT will also try to talk to him.

## 2023-02-10 ENCOUNTER — Telehealth: Payer: Self-pay

## 2023-02-10 NOTE — Telephone Encounter (Signed)
Triage Call:  Patient called to see if he could get his appointment moved up from 03/22/23 because he is still experiencing worsening pain that is sharp when he walks on his food and when he lays down at night.   Call was interrupted when patient stated he had to go because he was trying to set up transportation for something.  Told him we could talk tomorrow.

## 2023-02-16 ENCOUNTER — Ambulatory Visit: Payer: Medicare Other | Admitting: Podiatry

## 2023-03-09 ENCOUNTER — Ambulatory Visit (INDEPENDENT_AMBULATORY_CARE_PROVIDER_SITE_OTHER): Payer: 59 | Admitting: Podiatry

## 2023-03-09 ENCOUNTER — Encounter: Payer: Self-pay | Admitting: Podiatry

## 2023-03-09 DIAGNOSIS — B351 Tinea unguium: Secondary | ICD-10-CM | POA: Diagnosis not present

## 2023-03-09 DIAGNOSIS — M79675 Pain in left toe(s): Secondary | ICD-10-CM

## 2023-03-09 DIAGNOSIS — M79674 Pain in right toe(s): Secondary | ICD-10-CM | POA: Diagnosis not present

## 2023-03-09 DIAGNOSIS — E119 Type 2 diabetes mellitus without complications: Secondary | ICD-10-CM | POA: Diagnosis not present

## 2023-03-09 NOTE — Progress Notes (Signed)
Chief Complaint  Patient presents with   RFC    RM#7 RFC Nail trim    SUBJECTIVE Patient with a history of diabetes mellitus presents to office today complaining of elongated, thickened nails that cause pain while ambulating in shoes.  Patient is unable to trim their own nails. Patient is here for further evaluation and treatment.  Past Medical History:  Diagnosis Date   Diabetes mellitus without complication (HCC)    Hyperlipidemia    Hypertension    Peripheral vascular disease (HCC)    Tobacco use     No Known Allergies   OBJECTIVE General Patient is awake, alert, and oriented x 3 and in no acute distress. Derm Skin is dry and supple bilateral. Negative open lesions or macerations. Remaining integument unremarkable. Nails are tender, long, thickened and dystrophic with subungual debris, consistent with onychomycosis, 1-5 bilateral. No signs of infection noted. Vasc  ABDOMINAL AORTOGRAM W/LOWER EXTREMITY 12/17/2022 OPERATIVE FINDINGS:    Left renal artery: patent Right renal artery: patent  Infrarenal aorta: patent aortobifemoral bypass  Left common iliac artery: occluded Right common iliac artery: occluded  Left internal iliac artery: occluded Right internal iliac artery: occluded  Left external iliac artery: occluded Right external iliac artery: occluded  Left common femoral artery: patent Right common femoral artery: patent  Left profunda femoris artery: patent Right profunda femoris artery: not studied  Left superficial femoral artery: occluded Right superficial femoral artery: not studied  Left popliteal artery: reconstitutes behind the knee Right popliteal artery: not studied  Left anterior tibial artery: patent to the DP and foot Right anterior tibial artery: not studied  Left tibioperoneal trunk: patent Right tibioperoneal trunk: not studied  Left peroneal artery: occluded Right peroneal artery: not studied  Left posterior tibial artery: occluded Right posterior  tibial artery: not studied  Left pedal circulation: fills via AT/DP Right pedal circulation: not studied  VAS [place] ABI WITH/WO TBI 11/03/2022 ABI Findings:  +---------+------------------+-----+-------------------+--------------+  Right   Rt Pressure (mmHg)IndexWaveform           Comment         +---------+------------------+-----+-------------------+--------------+  Brachial 181                                                       +---------+------------------+-----+-------------------+--------------+  PTA     99                0.53 monophasic                         +---------+------------------+-----+-------------------+--------------+  DP      110               0.59 dampened monophasicbarely audible  +---------+------------------+-----+-------------------+--------------+  Great Toe59                0.31 Abnormal                           +---------+------------------+-----+-------------------+--------------+   +---------+------------------+-----+-------------------+-------+  Left    Lt Pressure (mmHg)IndexWaveform           Comment  +---------+------------------+-----+-------------------+-------+  Brachial 188                                                +---------+------------------+-----+-------------------+-------+  PTA                                               Absent   +---------+------------------+-----+-------------------+-------+  PERO    69                0.38 dampened monophasic         +---------+------------------+-----+-------------------+-------+  DP      60                0.32 dampened monophasic         +---------+------------------+-----+-------------------+-------+  Great Toe59                0.31                             +---------+------------------+-----+-------------------+-------+   +-------+-----------+-----------+------------+------------+  ABI/TBIToday's ABIToday's TBIPrevious  ABIPrevious TBI  +-------+-----------+-----------+------------+------------+  Right 0.59       0.31       0.81        absent        +-------+-----------+-----------+------------+------------+  Left  0.32       0.31       0.37        absent        +-------+-----------+-----------+------------+------------+  Summary:  Right: Resting right ankle-brachial index indicates moderate right lower  extremity arterial disease. The right toe-brachial index is abnormal.   Left: Resting left ankle-brachial index indicates severe left lower  extremity arterial disease. The left toe-brachial index is abnormal.   Neuro grossly intact via light touch  Musculoskeletal Exam No symptomatic pedal deformities noted bilateral. Muscular strength within normal limits.  No prior amputations  ASSESSMENT 1. Diabetes Mellitus w/ peripheral neuropathy 2.  Pain due to onychomycosis of toenails bilateral 3.  Severe peripheral vascular disease 4.  Encounter for diabetic foot exam  PLAN OF CARE -Patient evaluated.  Comprehensive diabetic foot exam performed today -Mechanical debridement of nails 1-5 bilateral performed using a nail nipper without incident or bleeding -Continue management with vein and vascular -Return to clinic 3 months routine footcare    Felecia Shelling, DPM [facility]  Dr. Felecia Shelling, DPM    [address] [place], [place] [address]                Office [phone]  Fax [phone]

## 2023-03-21 NOTE — H&P (View-Only) (Signed)
 POST OPERATIVE OFFICE NOTE    CC:  F/u for surgery  HPI:  This is a 63 y.o. male who is s/p left common to below the knee popliteal bypass with vein by Dr. Lenell Antu on 12/22/2022 due to critical limb ischemia of the left leg with rest pain.  He has history of aortobifemoral bypass graft by Dr. Lenell Antu in 2022.  Rest pain of the left foot has resolved since surgery.  He believes his incisions are healing well.  He is still requiring pain medication to sleep and to increase his mobility during the day.  He is on a statin daily.  He also takes a aspirin daily.  It should also be noted that he has a right dorsal foot wound caused by cortisone injections about a year ago.  He has a known right SFA occlusion.  No Known Allergies  Current Outpatient Medications  Medication Sig Dispense Refill   aspirin EC 81 MG tablet Take 1 tablet (81 mg total) by mouth daily. Swallow whole.     atorvastatin (LIPITOR) 80 MG tablet Take 1 tablet (80 mg total) by mouth at bedtime. 30 tablet 1   Blood Pressure Monitor KIT Check blood pressure daily (Patient taking differently: 1 each by Other route once a week.) 1 kit 0   Cholecalciferol (VITAMIN D3 ULTRA POTENCY) 1.25 MG (50000 UT) TABS Take 50,000 Units by mouth once a week. Thursdays or Fridays     DULoxetine (CYMBALTA) 30 MG capsule Take 30 mg by mouth daily in the afternoon.     gabapentin (NEURONTIN) 600 MG tablet Take 1 tablet (600 mg) in morning and afternoon. Take 2 tablets (1200 mg) at night (Patient taking differently: Take 600 mg by mouth 3 (three) times daily.) 120 tablet 0   lisinopril (ZESTRIL) 10 MG tablet Take 10 mg by mouth daily.     Multiple Vitamin (MULTIVITAMIN WITH MINERALS) TABS tablet Take 1 tablet by mouth daily.     oxyCODONE (OXY IR/ROXICODONE) 5 MG immediate release tablet Take 1 tablet (5 mg total) by mouth every 4 (four) hours as needed for moderate pain (pain score 4-6). 30 tablet 0   pantoprazole (PROTONIX) 20 MG tablet Take 20 mg by mouth  daily in the afternoon.     Current Facility-Administered Medications  Medication Dose Route Frequency Provider Last Rate Last Admin   sodium chloride flush (NS) 0.9 % injection 3 mL  3 mL Intravenous Q12H Runell Gess, MD         ROS:  See HPI  Physical Exam:  There were no vitals filed for this visit.   Incision: Incisions of the left lower extremity are healing well; left groin with some fullness but no drainage or sign of infection Extremities: Edematous left foot and ankle but brisk DP and PT signals by Doppler Neuro: A&O  Assessment/Plan:  This is a 63 y.o. male who is s/p: Left femoral to below the knee popliteal bypass with vein due to ischemic rest pain of the left foot  Left foot rest pain has resolved since surgery.  Left foot is well-perfused with brisk DP and PT signals by Doppler.  Bypass is also palpable at the medial knee.  He has edema of the left foot and ankle however reassured him that this is expected after bypass surgery.  He continues however to be in pain related to his surgery.  I will refill his narcotic prescription.  Incisions are also healing well.  I encouraged him to cleanse his left  groin with soap and water on a daily basis then pat dry.  He will continue his aspirin and statin daily.  Encouraged him to increase his mobility to speed the healing process.  He is scheduled for bypass duplex and ABI with Dr. Lenell Antu in a couple weeks.  Patient has a known right SFA occlusion as well.  He has a dorsal foot wound on the right foot caused by cortisone injections about a year ago.  He is aware he may require outflow surgery on the right leg as well.  We will let him recover from his current surgery before considering any further surgery.   [name], PA-C [facility] [phone]  Clinic MD:  Hetty Blend

## 2023-03-21 NOTE — Progress Notes (Unsigned)
 POST OPERATIVE OFFICE NOTE    CC:  F/u for surgery  HPI:  This is a 63 y.o. male who is s/p left common to below the knee popliteal bypass with vein by Dr. Lenell Antu on 12/22/2022 due to critical limb ischemia of the left leg with rest pain.  He has history of aortobifemoral bypass graft by Dr. Lenell Antu in 2022.  Rest pain of the left foot has resolved since surgery.  He believes his incisions are healing well.  He is still requiring pain medication to sleep and to increase his mobility during the day.  He is on a statin daily.  He also takes a aspirin daily.  It should also be noted that he has a right dorsal foot wound caused by cortisone injections about a year ago.  He has a known right SFA occlusion.  No Known Allergies  Current Outpatient Medications  Medication Sig Dispense Refill   aspirin EC 81 MG tablet Take 1 tablet (81 mg total) by mouth daily. Swallow whole.     atorvastatin (LIPITOR) 80 MG tablet Take 1 tablet (80 mg total) by mouth at bedtime. 30 tablet 1   Blood Pressure Monitor KIT Check blood pressure daily (Patient taking differently: 1 each by Other route once a week.) 1 kit 0   Cholecalciferol (VITAMIN D3 ULTRA POTENCY) 1.25 MG (50000 UT) TABS Take 50,000 Units by mouth once a week. Thursdays or Fridays     DULoxetine (CYMBALTA) 30 MG capsule Take 30 mg by mouth daily in the afternoon.     gabapentin (NEURONTIN) 600 MG tablet Take 1 tablet (600 mg) in morning and afternoon. Take 2 tablets (1200 mg) at night (Patient taking differently: Take 600 mg by mouth 3 (three) times daily.) 120 tablet 0   lisinopril (ZESTRIL) 10 MG tablet Take 10 mg by mouth daily.     Multiple Vitamin (MULTIVITAMIN WITH MINERALS) TABS tablet Take 1 tablet by mouth daily.     oxyCODONE (OXY IR/ROXICODONE) 5 MG immediate release tablet Take 1 tablet (5 mg total) by mouth every 4 (four) hours as needed for moderate pain (pain score 4-6). 30 tablet 0   pantoprazole (PROTONIX) 20 MG tablet Take 20 mg by mouth  daily in the afternoon.     Current Facility-Administered Medications  Medication Dose Route Frequency Provider Last Rate Last Admin   sodium chloride flush (NS) 0.9 % injection 3 mL  3 mL Intravenous Q12H Runell Gess, MD         ROS:  See HPI  Physical Exam:  There were no vitals filed for this visit.   Incision: Incisions of the left lower extremity are healing well; left groin with some fullness but no drainage or sign of infection Extremities: Edematous left foot and ankle but brisk DP and PT signals by Doppler Neuro: A&O  Assessment/Plan:  This is a 63 y.o. male who is s/p: Left femoral to below the knee popliteal bypass with vein due to ischemic rest pain of the left foot  Left foot rest pain has resolved since surgery.  Left foot is well-perfused with brisk DP and PT signals by Doppler.  Bypass is also palpable at the medial knee.  He has edema of the left foot and ankle however reassured him that this is expected after bypass surgery.  He continues however to be in pain related to his surgery.  I will refill his narcotic prescription.  Incisions are also healing well.  I encouraged him to cleanse his left  groin with soap and water on a daily basis then pat dry.  He will continue his aspirin and statin daily.  Encouraged him to increase his mobility to speed the healing process.  He is scheduled for bypass duplex and ABI with Dr. Lenell Antu in a couple weeks.  Patient has a known right SFA occlusion as well.  He has a dorsal foot wound on the right foot caused by cortisone injections about a year ago.  He is aware he may require outflow surgery on the right leg as well.  We will let him recover from his current surgery before considering any further surgery.   [name], PA-C [facility] [phone]  Clinic MD:  Hetty Blend

## 2023-03-22 ENCOUNTER — Encounter: Payer: Self-pay | Admitting: Vascular Surgery

## 2023-03-22 ENCOUNTER — Ambulatory Visit (INDEPENDENT_AMBULATORY_CARE_PROVIDER_SITE_OTHER)
Admission: RE | Admit: 2023-03-22 | Discharge: 2023-03-22 | Disposition: A | Discharge status: Home / Self Care | Payer: 59 | Source: Ambulatory Visit | Attending: Vascular Surgery | Admitting: Vascular Surgery

## 2023-03-22 ENCOUNTER — Ambulatory Visit (HOSPITAL_COMMUNITY)
Admission: RE | Admit: 2023-03-22 | Discharge: 2023-03-22 | Disposition: A | Discharge status: Home / Self Care | Payer: 59 | Source: Ambulatory Visit | Attending: Vascular Surgery | Admitting: Vascular Surgery

## 2023-03-22 ENCOUNTER — Other Ambulatory Visit: Payer: Self-pay

## 2023-03-22 ENCOUNTER — Ambulatory Visit (INDEPENDENT_AMBULATORY_CARE_PROVIDER_SITE_OTHER): Payer: 59 | Admitting: Vascular Surgery

## 2023-03-22 VITALS — BP 159/84 | HR 79 | Temp 98.0°F | Ht 62.0 in | Wt 110.5 lb

## 2023-03-22 DIAGNOSIS — I70222 Atherosclerosis of native arteries of extremities with rest pain, left leg: Secondary | ICD-10-CM

## 2023-03-22 DIAGNOSIS — I739 Peripheral vascular disease, unspecified: Secondary | ICD-10-CM | POA: Diagnosis present

## 2023-03-22 DIAGNOSIS — I70201 Unspecified atherosclerosis of native arteries of extremities, right leg: Secondary | ICD-10-CM

## 2023-03-22 LAB — VAS US ABI WITH/WO TBI
Left ABI: 0.39
Right ABI: 0.7

## 2023-03-22 MED ORDER — OXYCODONE HCL 5 MG PO TABS: 5.0000 mg | ORAL_TABLET | Freq: Three times a day (TID) | ORAL | 0 refills | Status: DC | PRN

## 2023-03-30 ENCOUNTER — Telehealth: Payer: Self-pay

## 2023-03-30 NOTE — Telephone Encounter (Signed)
 Patient left a message asking if he could come to hospital earlier than 5:30 am on 3/14.  Attempted to return his phone call.  No answer / No VM

## 2023-04-08 ENCOUNTER — Encounter (HOSPITAL_COMMUNITY)
Admission: RE | Disposition: A | Discharge status: Home / Self Care | Payer: Self-pay | Source: Home / Self Care | Attending: Vascular Surgery

## 2023-04-08 ENCOUNTER — Encounter (HOSPITAL_COMMUNITY): Payer: Self-pay | Admitting: Vascular Surgery

## 2023-04-08 ENCOUNTER — Observation Stay (HOSPITAL_COMMUNITY)
Admission: RE | Admit: 2023-04-08 | Discharge: 2023-04-10 | Disposition: A | Discharge status: Home / Self Care | Payer: 59 | Attending: Vascular Surgery | Admitting: Vascular Surgery

## 2023-04-08 ENCOUNTER — Other Ambulatory Visit: Payer: Self-pay

## 2023-04-08 DIAGNOSIS — Z7982 Long term (current) use of aspirin: Secondary | ICD-10-CM | POA: Insufficient documentation

## 2023-04-08 DIAGNOSIS — E119 Type 2 diabetes mellitus without complications: Secondary | ICD-10-CM | POA: Diagnosis not present

## 2023-04-08 DIAGNOSIS — I70222 Atherosclerosis of native arteries of extremities with rest pain, left leg: Principal | ICD-10-CM | POA: Insufficient documentation

## 2023-04-08 DIAGNOSIS — Y828 Other medical devices associated with adverse incidents: Secondary | ICD-10-CM | POA: Insufficient documentation

## 2023-04-08 DIAGNOSIS — I70229 Atherosclerosis of native arteries of extremities with rest pain, unspecified extremity: Principal | ICD-10-CM | POA: Diagnosis present

## 2023-04-08 DIAGNOSIS — I739 Peripheral vascular disease, unspecified: Principal | ICD-10-CM

## 2023-04-08 DIAGNOSIS — Z79899 Other long term (current) drug therapy: Secondary | ICD-10-CM | POA: Diagnosis not present

## 2023-04-08 DIAGNOSIS — T82867A Thrombosis of cardiac prosthetic devices, implants and grafts, initial encounter: Secondary | ICD-10-CM | POA: Insufficient documentation

## 2023-04-08 DIAGNOSIS — I10 Essential (primary) hypertension: Secondary | ICD-10-CM | POA: Insufficient documentation

## 2023-04-08 HISTORY — PX: LOWER EXTREMITY INTERVENTION: CATH118252

## 2023-04-08 HISTORY — PX: ABDOMINAL AORTOGRAM W/LOWER EXTREMITY: CATH118223

## 2023-04-08 LAB — POCT I-STAT, CHEM 8
BUN: 16 mg/dL (ref 8–23)
Calcium, Ion: 1.25 mmol/L (ref 1.15–1.40)
Chloride: 103 mmol/L (ref 98–111)
Creatinine, Ser: 1 mg/dL (ref 0.61–1.24)
Glucose, Bld: 119 mg/dL — ABNORMAL HIGH (ref 70–99)
HCT: 43 % (ref 39.0–52.0)
Hemoglobin: 14.6 g/dL (ref 13.0–17.0)
Potassium: 3.7 mmol/L (ref 3.5–5.1)
Sodium: 141 mmol/L (ref 135–145)
TCO2: 26 mmol/L (ref 22–32)

## 2023-04-08 LAB — COMPREHENSIVE METABOLIC PANEL
ALT: 28 U/L (ref 0–44)
AST: 36 U/L (ref 15–41)
Albumin: 3.4 g/dL — ABNORMAL LOW (ref 3.5–5.0)
Alkaline Phosphatase: 84 U/L (ref 38–126)
Anion gap: 12 (ref 5–15)
BUN: 12 mg/dL (ref 8–23)
CO2: 22 mmol/L (ref 22–32)
Calcium: 9 mg/dL (ref 8.9–10.3)
Chloride: 105 mmol/L (ref 98–111)
Creatinine, Ser: 1.1 mg/dL (ref 0.61–1.24)
GFR, Estimated: >60 mL/min (ref >60)
Glucose, Bld: 116 mg/dL — ABNORMAL HIGH (ref 70–99)
Potassium: 3.6 mmol/L (ref 3.5–5.1)
Sodium: 139 mmol/L (ref 135–145)
Total Bilirubin: 0.6 mg/dL (ref 0.0–1.2)
Total Protein: 7.1 g/dL (ref 6.5–8.1)

## 2023-04-08 LAB — CBC
HCT: 42.9 % (ref 39.0–52.0)
Hemoglobin: 13.7 g/dL (ref 13.0–17.0)
MCH: 28.2 pg (ref 26.0–34.0)
MCHC: 31.9 g/dL (ref 30.0–36.0)
MCV: 88.5 fL (ref 80.0–100.0)
Platelets: 234 10*3/uL (ref 150–400)
RBC: 4.85 MIL/uL (ref 4.22–5.81)
RDW: 16.4 % — ABNORMAL HIGH (ref 11.5–15.5)
WBC: 6.6 10*3/uL (ref 4.0–10.5)
nRBC: 0 % (ref 0.0–0.2)

## 2023-04-08 LAB — PROTIME-INR
INR: 1 (ref 0.8–1.2)
Prothrombin Time: 13.7 s (ref 11.4–15.2)

## 2023-04-08 LAB — POCT ACTIVATED CLOTTING TIME
Activated Clotting Time: 262 s
Activated Clotting Time: 147 s

## 2023-04-08 LAB — HIV ANTIBODY (ROUTINE TESTING W REFLEX): HIV Screen 4th Generation wRfx: NONREACTIVE

## 2023-04-08 SURGERY — ABDOMINAL AORTOGRAM W/LOWER EXTREMITY
Anesthesia: LOCAL

## 2023-04-08 MED ORDER — PANTOPRAZOLE SODIUM 40 MG PO TBEC
40.0000 mg | DELAYED_RELEASE_TABLET | Freq: Every day | ORAL | Status: DC
Start: 2023-04-08 — End: 2023-04-10
  Administered 2023-04-09 – 2023-04-10 (×2): 40 mg via ORAL
  Filled 2023-04-08 (×2): qty 1

## 2023-04-08 MED ORDER — MIDAZOLAM HCL 2 MG/2ML IJ SOLN
INTRAMUSCULAR | Status: AC
  Filled 2023-04-08: qty 2

## 2023-04-08 MED ORDER — CLOPIDOGREL BISULFATE 75 MG PO TABS: 300.0000 mg | ORAL_TABLET | Freq: Once | ORAL | Status: DC

## 2023-04-08 MED ORDER — OXYCODONE HCL 5 MG PO TABS
10.0000 mg | ORAL_TABLET | ORAL | Status: DC | PRN
  Administered 2023-04-08 (×3): 10 mg via ORAL
  Filled 2023-04-08 (×2): qty 2

## 2023-04-08 MED ORDER — FENTANYL CITRATE (PF) 100 MCG/2ML IJ SOLN
INTRAMUSCULAR | Status: DC | PRN
  Administered 2023-04-08 (×2): 50 ug via INTRAVENOUS

## 2023-04-08 MED ORDER — HEPARIN SODIUM (PORCINE) 5000 UNIT/ML IJ SOLN
5000.0000 [IU] | Freq: Three times a day (TID) | INTRAMUSCULAR | Status: DC
  Administered 2023-04-09 – 2023-04-10 (×4): 5000 [IU] via SUBCUTANEOUS
  Filled 2023-04-08 (×5): qty 1

## 2023-04-08 MED ORDER — IODIXANOL 320 MG/ML IV SOLN
INTRAVENOUS | Status: DC | PRN
  Administered 2023-04-08: 70 mL

## 2023-04-08 MED ORDER — LIDOCAINE HCL (PF) 1 % IJ SOLN
INTRAMUSCULAR | Status: AC
  Filled 2023-04-08: qty 30

## 2023-04-08 MED ORDER — ASPIRIN 325 MG PO TABS
ORAL_TABLET | ORAL | Status: AC
  Filled 2023-04-08: qty 1

## 2023-04-08 MED ORDER — GUAIFENESIN-DM 100-10 MG/5ML PO SYRP: 15.0000 mL | ORAL_SOLUTION | ORAL | Status: DC | PRN

## 2023-04-08 MED ORDER — LABETALOL HCL 5 MG/ML IV SOLN
INTRAVENOUS | Status: DC | PRN
Start: 2023-04-08 — End: 2023-04-08
  Administered 2023-04-08: 20 mg via INTRAVENOUS

## 2023-04-08 MED ORDER — ONDANSETRON HCL 4 MG/2ML IJ SOLN: 4.0000 mg | Freq: Four times a day (QID) | INTRAMUSCULAR | Status: DC | PRN

## 2023-04-08 MED ORDER — FENTANYL CITRATE (PF) 100 MCG/2ML IJ SOLN
INTRAMUSCULAR | Status: AC
  Filled 2023-04-08: qty 2

## 2023-04-08 MED ORDER — CLOPIDOGREL BISULFATE 75 MG PO TABS
75.0000 mg | ORAL_TABLET | Freq: Every day | ORAL | Status: DC
  Administered 2023-04-09 – 2023-04-10 (×2): 75 mg via ORAL
  Filled 2023-04-08 (×2): qty 1

## 2023-04-08 MED ORDER — SODIUM CHLORIDE 0.9 % IV SOLN: 250.0000 mL | INTRAVENOUS | Status: AC | PRN

## 2023-04-08 MED ORDER — SODIUM CHLORIDE 0.9% FLUSH: 3.0000 mL | INTRAVENOUS | Status: DC | PRN

## 2023-04-08 MED ORDER — PROTAMINE SULFATE 10 MG/ML IV SOLN
INTRAVENOUS | Status: DC | PRN
  Administered 2023-04-08: 5 mg via INTRAVENOUS
  Administered 2023-04-08: 45 mg via INTRAVENOUS

## 2023-04-08 MED ORDER — LABETALOL HCL 5 MG/ML IV SOLN
INTRAVENOUS | Status: AC
  Filled 2023-04-08: qty 4

## 2023-04-08 MED ORDER — POTASSIUM CHLORIDE CRYS ER 20 MEQ PO TBCR
20.0000 meq | EXTENDED_RELEASE_TABLET | Freq: Once | ORAL | Status: AC
Start: 2023-04-08 — End: 2023-04-08
  Administered 2023-04-08: 20 meq via ORAL
  Filled 2023-04-08: qty 1

## 2023-04-08 MED ORDER — HEPARIN SODIUM (PORCINE) 1000 UNIT/ML IJ SOLN
INTRAMUSCULAR | Status: DC | PRN
  Administered 2023-04-08: 5000 [IU] via INTRAVENOUS

## 2023-04-08 MED ORDER — PROTAMINE SULFATE 10 MG/ML IV SOLN
INTRAVENOUS | Status: AC
  Filled 2023-04-08: qty 5

## 2023-04-08 MED ORDER — METOPROLOL TARTRATE 5 MG/5ML IV SOLN: 2.0000 mg | INTRAVENOUS | Status: DC | PRN

## 2023-04-08 MED ORDER — OXYCODONE HCL 5 MG PO TABS
ORAL_TABLET | ORAL | Status: AC
Start: 2023-04-08 — End: 2023-04-08
  Filled 2023-04-08: qty 2

## 2023-04-08 MED ORDER — SODIUM CHLORIDE 0.9 % WEIGHT BASED INFUSION
1.0000 mL/kg/h | INTRAVENOUS | Status: AC
  Administered 2023-04-08: 1 mL/kg/h via INTRAVENOUS

## 2023-04-08 MED ORDER — PHENOL 1.4 % MT LIQD: 1.0000 | OROMUCOSAL | Status: DC | PRN

## 2023-04-08 MED ORDER — ACETAMINOPHEN 325 MG PO TABS
650.0000 mg | ORAL_TABLET | Freq: Four times a day (QID) | ORAL | Status: DC
  Administered 2023-04-08 – 2023-04-10 (×7): 650 mg via ORAL
  Filled 2023-04-08 (×8): qty 2

## 2023-04-08 MED ORDER — SODIUM CHLORIDE 0.9 % IV SOLN: INTRAVENOUS | Status: DC

## 2023-04-08 MED ORDER — HEPARIN (PORCINE) IN NACL 1000-0.9 UT/500ML-% IV SOLN
INTRAVENOUS | Status: DC | PRN
  Administered 2023-04-08 (×2): 500 mL

## 2023-04-08 MED ORDER — HYDRALAZINE HCL 20 MG/ML IJ SOLN: 5.0000 mg | INTRAMUSCULAR | Status: DC | PRN

## 2023-04-08 MED ORDER — ASPIRIN 81 MG PO TBEC
81.0000 mg | DELAYED_RELEASE_TABLET | Freq: Every day | ORAL | Status: DC
  Administered 2023-04-09 – 2023-04-10 (×2): 81 mg via ORAL
  Filled 2023-04-08 (×2): qty 1

## 2023-04-08 MED ORDER — SODIUM CHLORIDE 0.9% FLUSH
3.0000 mL | Freq: Two times a day (BID) | INTRAVENOUS | Status: DC
  Administered 2023-04-08 – 2023-04-10 (×4): 3 mL via INTRAVENOUS

## 2023-04-08 MED ORDER — MIDAZOLAM HCL 2 MG/2ML IJ SOLN
INTRAMUSCULAR | Status: DC | PRN
  Administered 2023-04-08: 1 mg via INTRAVENOUS

## 2023-04-08 MED ORDER — ASPIRIN 325 MG PO TABS
ORAL_TABLET | ORAL | Status: DC | PRN
  Administered 2023-04-08: 325 mg via ORAL

## 2023-04-08 MED ORDER — CLOPIDOGREL BISULFATE 75 MG PO TABS: 75.0000 mg | ORAL_TABLET | Freq: Every day | ORAL | 11 refills | Status: DC

## 2023-04-08 MED ORDER — LABETALOL HCL 5 MG/ML IV SOLN
10.0000 mg | INTRAVENOUS | Status: DC | PRN
  Administered 2023-04-10: 10 mg via INTRAVENOUS
  Filled 2023-04-08: qty 4

## 2023-04-08 MED ORDER — CLOPIDOGREL BISULFATE 300 MG PO TABS
ORAL_TABLET | ORAL | Status: DC | PRN
  Administered 2023-04-08: 300 mg via ORAL

## 2023-04-08 MED ORDER — ALUM & MAG HYDROXIDE-SIMETH 200-200-20 MG/5ML PO SUSP: 15.0000 mL | ORAL | Status: DC | PRN

## 2023-04-08 MED ORDER — ACETAMINOPHEN 325 MG PO TABS: 650.0000 mg | ORAL_TABLET | ORAL | Status: DC | PRN

## 2023-04-08 MED ORDER — LIDOCAINE HCL (PF) 1 % IJ SOLN
INTRAMUSCULAR | Status: DC | PRN
  Administered 2023-04-08: 15 mL

## 2023-04-08 SURGICAL SUPPLY — 23 items
BALLN ADMIRAL INPACT 5X250 (BALLOONS) ×2 IMPLANT
BALLN MUSTANG 5X80X135 (BALLOONS) ×2 IMPLANT
BALLN MUSTANG 6X80X135 (BALLOONS) ×2 IMPLANT
BALLOON ADMIRAL INPACT 5X250 (BALLOONS) IMPLANT
BALLOON MUSTANG 5X80X135 (BALLOONS) IMPLANT
BALLOON MUSTANG 6X80X135 (BALLOONS) IMPLANT
CATH OMNI FLUSH 5F 65CM (CATHETERS) IMPLANT
CATH QUICKCROSS SUPP .035X90CM (MICROCATHETER) IMPLANT
COVER DOME SNAP 22 D (MISCELLANEOUS) IMPLANT
GLIDEWIRE ADV .035X180CM (WIRE) IMPLANT
GLIDEWIRE ADV .035X260CM (WIRE) IMPLANT
KIT ENCORE 26 ADVANTAGE (KITS) IMPLANT
KIT MICROPUNCTURE NIT STIFF (SHEATH) IMPLANT
KIT SINGLE USE MANIFOLD (KITS) IMPLANT
KIT SYRINGE INJ CVI SPIKEX1 (MISCELLANEOUS) IMPLANT
PACK CARDIAC CATHETERIZATION (CUSTOM PROCEDURE TRAY) IMPLANT
SET ATX-X65L (MISCELLANEOUS) IMPLANT
SHEATH PINNACLE 5F 10CM (SHEATH) IMPLANT
SHEATH PINNACLE 6F 10CM (SHEATH) IMPLANT
SHEATH PROBE COVER 6X72 (BAG) IMPLANT
STENT ELUVIA 6X40X130 (Permanent Stent) IMPLANT
WIRE BENTSON .035X145CM (WIRE) IMPLANT
WIRE COMMAND ST 018 300CM (WIRE) IMPLANT

## 2023-04-08 NOTE — Discharge Instructions (Signed)
 Femoral Site Care The following information offers guidance on how to care for yourself after your procedure. Your health care provider may also give you more specific instructions. If you have problems or questions, contact your health care provider. What can I expect after the procedure? After the procedure, it is common to have bruising and tenderness at the incision site. This usually fades within 1-2 weeks. Follow these instructions at home: Incision site care  Follow instructions from your health care provider about how to take care of your incision site. Make sure you: Wash your hands with soap and water for at least 20 seconds before and after you change your bandage (dressing). If soap and water are not available, use hand sanitizer. Remove your dressing in 24 hours. Leave stitches (sutures), skin glue, or adhesive strips in place. These skin closures may need to stay in place for 2 weeks or longer. If adhesive strip edges start to loosen and curl up, you may trim the loose edges. Do not remove adhesive strips completely unless your health care provider tells you to do that. Do not take baths, swim, or use a hot tub for at least 1 week. You may shower 24 hours after the procedure or as told by your health care provider. Gently wash the incision site with plain soap and water. Pat the area dry with a clean towel. Do not rub the site. This may cause bleeding. Do not apply powder or lotion to the site. Keep the site clean and dry. Check your femoral site every day for signs of infection. Check for: Redness, swelling, or pain. Fluid or blood. Warmth. Pus or a bad smell. Activity If you were given a sedative during the procedure, it can affect you for several hours. Do not drive or operate machinery until your health care provider says that it is safe. Rest as told by your health care provider. Avoid sitting for a long time without moving. Get up to take short walks every 1-2 hours. This  is important to improve blood flow and breathing. Ask for help if you feel weak or unsteady. Return to your normal activities as told by your health care provider. Ask your health care provider what activities are safe for you and when you can return to work. Avoid activities that take a lot of effort for the first 2-3 days after your procedure, or as long as directed. Do not lift anything that is heavier than 10 lb (4.5 kg), or the limit that you are told, until your health care provider says that it is safe. General instructions Take over-the-counter and prescription medicines only as told by your health care provider. If you will be going home right after the procedure, plan to have a responsible adult care for you for the time you are told. This is important. Keep all follow-up visits. This is important. Contact a health care provider if: You have a fever or chills. You have any of these signs of infection at your incision site: Redness, swelling, or pain. Fluid or blood. Warmth. Pus or a bad smell. Get help right away if: The incision area swells very fast. The incision area is bleeding, and the bleeding does not stop when you hold steady pressure on the area. Your leg or foot becomes pale, cool, tingly, or numb. These symptoms may represent a serious problem that is an emergency. Do not wait to see if the symptoms will go away. Get medical help right away. Call your local emergency  services (911 in the [place] Do not drive yourself to the hospital. Summary After the procedure, it is common to have bruising and tenderness that fade within 1-2 weeks. Check your femoral site every day for signs of infection. Do not lift anything that is heavier than 10 lb (4.5 kg), or the limit that you are told, until your health care provider says that it is safe. Get help right away if the incision area swells very fast, you have bleeding at the incision area that does not stop, or your leg or foot  becomes pale, cool, or numb. This information is not intended to replace advice given to you by your health care provider. Make sure you discuss any questions you have with your health care provider. Document Revised: 10/01/2020 Document Reviewed: 03/02/2020 Elsevier Patient Education  2024 [employer].

## 2023-04-08 NOTE — Op Note (Signed)
 DATE OF SERVICE: 04/08/2023  PATIENT:  [name].  63 y.o. 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 Lenell Antu, MD  ASSISTANT: none  ANESTHESIA:   local and IV sedation  ESTIMATED BLOOD LOSS: minimal  LOCAL MEDICATIONS USED:  LIDOCAINE   COUNTS: confirmed correct.  PATIENT DISPOSITION:  PACU - hemodynamically stable.   Delay start of Pharmacological VTE agent (>24hrs) due to surgical blood loss or risk of bleeding: no  INDICATION FOR PROCEDURE: [name]. is a 63 y.o. male with left lower extremity atherosclerosis with rest pain. After careful discussion of risks, benefits, and alternatives the patient was offered angiogram. The patient understood and wished to proceed.  OPERATIVE FINDINGS:   Left renal artery: not studied Right renal artery: not studied  Infrarenal aorta: not studied  Left common iliac artery: not studied Right common iliac artery: not studied  Left internal iliac artery: not studied Right internal iliac artery: not studied  Left external iliac artery: not studied Right external iliac artery: not studied  Left common femoral artery: patent Right common femoral artery: not studied  Left profunda femoris artery: patent Right profunda femoris artery: not studied  Left superficial femoral artery: occluded, prior stenting Right superficial femoral artery: not studied  Left popliteal artery: reconstitutes behind the knee Right popliteal  artery: not studied  Left anterior tibial artery: patent to the foot Right anterior tibial artery: not studied  Left tibioperoneal trunk: occluded Right tibioperoneal trunk: not studied  Left peroneal artery: occluded Right peroneal artery: not studied  Left posterior tibial artery: occluded Right posterior tibial artery: not studied  Left pedal circulation: disadvantaged Right pedal circulation: not studied   GLASS score. FP 4. IP 0.  WIfI score. Not applicable.  DESCRIPTION OF PROCEDURE: After identification of the patient in the pre-operative holding area, the patient was transferred to the operating room. The patient was positioned supine on the operating room table. Anesthesia was induced. The groins was prepped and draped in standard fashion. A surgical pause was performed confirming correct patient, procedure, and operative location.  The left groin was anesthetized with subcutaneous injection of 1% lidocaine. Using ultrasound guidance, the left common femoral artery was accessed with micropuncture technique. Fluoroscopy was used to confirm cannulation over the femoral head. A sheath injection was performed. The 45F micropuncture sheath was introduced into the left femoral limb of the aortobifemoral bypass graft..   A wire was navigated into the profunda femoris artery.  I was able to navigate a 5 [place] sheath into the profunda femoris artery.  I was able to develop a subintimal plane into the occluded superficial femoral artery until the system into it using a Glidewire advantage, quick cross catheter.  Sheath access was upsized to 6 [place].  I was able to cross the occlusion and reentered the true lumen in the behind knee popliteal artery.  Angiography from the popliteal artery was used to confirm true lumen position. The decision was made to intervene. The patient was heparinized with 5000 units of heparin.  Selective angiography of the left lower  extremity was performed prior to  intervention.   The lesions were treated with: Left femoropopliteal angioplasty and stenting (6x32mm, 6x106mm Eluvia, 5x279mm INPact)  Completion angiography revealed:  Recannulization of the left femoral-popliteal arteries with inline flow to the foot.  Postoperative flow was noted in the anterior tibial artery at completion.  The sheath was left in place to be removed in the recovery area.  Conscious sedation was administered with the use of IV fentanyl and midazolam under continuous physician and nurse monitoring.  Heart rate, blood pressure, and oxygen saturation were continuously monitored.  Total sedation time was 71 minutes  Upon completion of the case instrument and sharps counts were confirmed correct. The patient was transferred to the PACU in good condition. I was present for all portions of the procedure.  PLAN: Aspirin 81 mg daily.  Plavix 75 mg daily.  High intensity statin therapy.  Follow-up with me in 4 weeks with duplex.  Mustaf smoking.  High risk for limb loss if he does not stop smoking.  Rande Brunt. Lenell Antu, MD [facility] Phone Number: [phone] 04/08/2023 9:09 AM

## 2023-04-08 NOTE — Progress Notes (Signed)
 6fr antegrade sheath aspirated and removed from left femoral artery. Manual pressure applied for 30  minutes. Site level 0, no S+S of hematoma. Tegaderm dressing applied, bedrest instructions given.    Left dp and pt pulses present with doppler. Right pt present with doppler, right dp absent.     Bedrest begins at 10:30:00

## 2023-04-08 NOTE — Interval H&P Note (Signed)
 History and Physical Interval Note:  04/08/2023 7:42 AM  [name] [employer].  has presented today for surgery, with the diagnosis of critical limb ischemia of left lower extremity with rest pain.  The various methods of treatment have been discussed with the patient and family. After consideration of risks, benefits and other options for treatment, the patient has consented to  Procedure(s): ABDOMINAL AORTOGRAM W/LOWER EXTREMITY (N/A) as a surgical intervention.  The patient's history has been reviewed, patient examined, no change in status, stable for surgery.  I have reviewed the patient's chart and labs.  Questions were answered to the patient's satisfaction.     [name]

## 2023-04-08 NOTE — Plan of Care (Signed)

## 2023-04-09 ENCOUNTER — Other Ambulatory Visit (HOSPITAL_COMMUNITY): Payer: Self-pay

## 2023-04-09 DIAGNOSIS — I70222 Atherosclerosis of native arteries of extremities with rest pain, left leg: Secondary | ICD-10-CM | POA: Diagnosis not present

## 2023-04-09 LAB — LIPID PANEL
Cholesterol: 129 mg/dL (ref 0–200)
HDL: 35 mg/dL — ABNORMAL LOW (ref >40)
LDL Cholesterol: 77 mg/dL (ref 0–99)
Total CHOL/HDL Ratio: 3.7 ratio
Triglycerides: 83 mg/dL (ref <150)
VLDL: 17 mg/dL (ref 0–40)

## 2023-04-09 MED ORDER — CLOPIDOGREL BISULFATE 75 MG PO TABS
75.0000 mg | ORAL_TABLET | Freq: Every day | ORAL | 11 refills | Status: AC
  Filled 2023-04-09: qty 30, 30d supply, fill #0

## 2023-04-09 NOTE — Plan of Care (Signed)
  Problem: Activity: Goal: Ability to return to baseline activity level will improve Outcome: Progressing   Problem: Cardiovascular: Goal: Ability to achieve and maintain adequate cardiovascular perfusion will improve Outcome: Completed/Met Goal: Vascular access site(s) Level 0-1 will be maintained Outcome: Completed/Met

## 2023-04-09 NOTE — Discharge Summary (Signed)
 [facility] Discharge Summary   Patient ID:  [name]. MRN: [id number] DOB/AGE: 07-30-1960 63 y.o.  Admit date: 04/08/2023 Discharge date: 04/09/2023 Attending Surgeon: Lenell Antu Admission Diagnosis: Atherosclerosis of artery of extremity with rest pain [facility]) [I70.229]  Discharge Diagnoses:  Atherosclerosis of artery of extremity with rest pain [facility]) [I70.229]  Secondary Diagnoses: Past Medical History:  Diagnosis Date   Diabetes mellitus without complication (HCC)    Hyperlipidemia    Hypertension    Peripheral vascular disease (HCC)    Tobacco use     Procedures: 04/08/2023 Procedure(s): ABDOMINAL AORTOGRAM W/LOWER EXTREMITY LOWER EXTREMITY INTERVENTION  Discharged Condition: good  HPI:  Hospital Course:  [name].  is a 63 y.o. male who is s/p left common to below the knee popliteal bypass with vein on 12/22/2022 due to critical limb ischemia of the left leg with rest pain.  He has history of aortobifemoral bypass graft in 2022.  Rest pain of the left foot has resolved since surgery.  He believes his incisions are healing well.  He is still requiring pain medication to sleep and to increase his mobility during the day.  He is on a statin daily.  He also takes a aspirin daily.  It should also be noted that he has a right dorsal foot wound caused by cortisone injections about a year ago.  He has a known right SFA occlusion.   03/22/23: returns to clinic for evaluation. He has return of severe left foot pain. He reports severe right foot pain, but states this is less severe than his left foot.  His left calf incision has reopened.  On 04/08/23, the patient underwent recanalization of the left superficial femoral artery access in the left aortobifemoral bypass hood.  Procedure was successful.  The patient reports resolution of rest pain this morning.    Consults:    Significant Diagnostic Studies: CBC    Component Value Date/Time   WBC  6.6 04/08/2023 1417   RBC 4.85 04/08/2023 1417   HGB 13.7 04/08/2023 1417   HGB 11.7 (L) 12/12/2019 1006   HCT 42.9 04/08/2023 1417   HCT 36.2 (L) 12/12/2019 1006   PLT 234 04/08/2023 1417   PLT 249 12/12/2019 1006   MCV 88.5 04/08/2023 1417   MCV 90 12/12/2019 1006   MCH 28.2 04/08/2023 1417   MCHC 31.9 04/08/2023 1417   RDW 16.4 (H) 04/08/2023 1417   RDW 14.5 12/12/2019 1006   LYMPHSABS 1.2 11/10/2020 1350   LYMPHSABS 1.5 07/31/2019 1401   MONOABS 0.8 11/10/2020 1350   EOSABS 0.1 11/10/2020 1350   EOSABS 0.2 07/31/2019 1401   BASOSABS 0.0 11/10/2020 1350   BASOSABS 0.0 07/31/2019 1401    BMET    Component Value Date/Time   NA 139 04/08/2023 1417   NA 136 12/12/2019 1006   K 3.6 04/08/2023 1417   CL 105 04/08/2023 1417   CO2 22 04/08/2023 1417   GLUCOSE 116 (H) 04/08/2023 1417   BUN 12 04/08/2023 1417   BUN 18 12/12/2019 1006   CREATININE 1.10 04/08/2023 1417   CALCIUM 9.0 04/08/2023 1417   GFRNONAA >60 04/08/2023 1417   GFRAA 82 12/12/2019 1006    COAG estimated creatinine clearance is 45.9 mL/min (by C-G formula based on SCr of 1.1 mg/dL).  No results found for: "PTT"  Disposition:  Discharge to :Home Discharge Instructions     Ambulatory Referral for Lung Cancer Scre   Complete by: As directed    Call  MD for:  redness, tenderness, or signs of infection (pain, swelling, bleeding, redness, odor or green/yellow discharge around incision site)   Complete by: As directed    Call MD for:  severe or increased pain, loss or decreased feeling  in affected limb(s)   Complete by: As directed    Call MD for:  temperature >100.5   Complete by: As directed    Resume previous diet   Complete by: As directed       Allergies as of 04/09/2023   No Known Allergies      Medication List     TAKE these medications    aspirin EC 81 MG tablet Take 1 tablet (81 mg total) by mouth daily. Swallow whole.   atorvastatin 80 MG tablet Commonly known as: LIPITOR Take 1  tablet (80 mg total) by mouth at bedtime.   Blood Pressure Monitor Kit Check blood pressure daily What changed:  how much to take how to take this when to take this additional instructions   clopidogrel 75 MG tablet Commonly known as: Plavix Take 1 tablet (75 mg total) by mouth daily.   DULoxetine 30 MG capsule Commonly known as: CYMBALTA Take 30 mg by mouth daily in the afternoon.   gabapentin 600 MG tablet Commonly known as: NEURONTIN Take 1 tablet (600 mg) in morning and afternoon. Take 2 tablets (1200 mg) at night What changed:  how much to take how to take this when to take this additional instructions   lisinopril 10 MG tablet Commonly known as: ZESTRIL Take 10 mg by mouth daily.   multivitamin with minerals Tabs tablet Take 1 tablet by mouth daily.   oxyCODONE 5 MG immediate release tablet Commonly known as: Oxy IR/ROXICODONE Take 1 tablet (5 mg total) by mouth every 4 (four) hours as needed for moderate pain (pain score 4-6).   oxyCODONE 5 MG immediate release tablet Commonly known as: Oxy IR/ROXICODONE Take 1 tablet (5 mg total) by mouth every 8 (eight) hours as needed for severe pain (pain score 7-10).   pantoprazole 20 MG tablet Commonly known as: PROTONIX Take 20 mg by mouth daily in the afternoon.   Vitamin D3 Ultra Potency 1250 MCG Tabs Generic drug: Cholecalciferol Take 50,000 Units by mouth once a week. Thursdays or Fridays        Verbal and written Discharge instructions given to the patient. Wound care per Discharge AVS  Rande Brunt. Lenell Antu, MD [facility] Phone Number: [phone] 04/09/2023 10:16 AM

## 2023-04-09 NOTE — Care Management Obs Status (Signed)
 MEDICARE OBSERVATION STATUS NOTIFICATION   Patient Details  Name: [name]. MRN: [id number] Date of Birth: 1960-01-31   Medicare Observation Status Notification Given:  Yes    Rupinder Livingston G., RN 04/09/2023, 8:46 AM

## 2023-04-10 DIAGNOSIS — I70222 Atherosclerosis of native arteries of extremities with rest pain, left leg: Secondary | ICD-10-CM | POA: Diagnosis not present

## 2023-04-10 NOTE — Evaluation (Signed)
 Occupational Therapy Evaluation Patient Details Name: [name]. MRN: [id number] DOB: 1960/10/10 Today's Date: 04/10/2023   History of Present Illness   Pt is a 63 y.o. male who presented 04/08/23 for  recanalization of the left superficial femoral artery access in the left aortobifemoral bypass hood. PMH includes: aortobifemoral BPG, L common femoral-pop BPG, DM II, HLD, HTN, PVD, and tobacco use.     Clinical Impressions At baseline, pt is Independent to Mod I with ADLs, IADLs, and functional mobility with use of SPC in the community. Pt reports two recent falls going up stairs at home due to pain in L LE. Pt now presents with decreased activity tolerance, decreased balance, generalized B UE weakness, pain in L LE affecting functional level, and decreased safety and independence with functional tasks. Pt currently demonstrates ability to complete UB ADLs Independent to Set up assist, LB ADLs with Contact guard to Min assist, and functional mobility/transfers with a RW with Contact guard assist. Pt VSS on RA throughout session. Pt participated well in session. Pt will benefit from acute skilled OT services to address deficits outlined below and to increase safety and independence with functional tasks. Post acute discharge, pt will benefit from continued skilled OT services in the home to maximize rehab potential.      If plan is discharge home, recommend the following:   A little help with walking and/or transfers;A little help with bathing/dressing/bathroom;Assistance with cooking/housework;Assist for transportation;Help with stairs or ramp for entrance     Functional Status Assessment   Patient has had a recent decline in their functional status and demonstrates the ability to make significant improvements in function in a reasonable and predictable amount of time.     Equipment Recommendations   Other (comment) (OT recommends shower chair. However, pt declines at this  time due to pt preference to sit down in tub during baths.)     Recommendations for Other Services         Precautions/Restrictions   Precautions Precautions: Fall Recall of Precautions/Restrictions: Intact Restrictions Weight Bearing Restrictions Per Provider Order: No     Mobility Bed Mobility Overal bed mobility: Needs Assistance Bed Mobility: Sit to Supine       Sit to supine: Supervision, Used rails   General bed mobility comments: with mildly increased time    Transfers Overall transfer level: Needs assistance Equipment used: Rolling walker (2 wheels) Transfers: Sit to/from Stand, Bed to chair/wheelchair/BSC Sit to Stand: Supervision     Step pivot transfers: Contact guard assist     General transfer comment: Befefits from CGA for increased safety      Balance Overall balance assessment: Needs assistance Sitting-balance support: No upper extremity supported, Feet supported Sitting balance-Leahy Scale: Good Sitting balance - Comments: sitting EOB   Standing balance support: No upper extremity supported, Single extremity supported, Bilateral upper extremity supported, During functional activity Standing balance-Leahy Scale: Fair Standing balance comment: No overt LOB in static or dynamic standing but with mild blance deficits noted in dynamic stand/funcitonal mobility. Pt benefiting from use of RW for increased stability and safety in dynamic standing/funcitonal mobility.                           ADL either performed or assessed with clinical judgement   ADL Overall ADL's : Needs assistance/impaired Eating/Feeding: Independent;Sitting   Grooming: Independent;Sitting   Upper Body Bathing: Set up;Sitting   Lower Body Bathing: Minimal assistance;Sitting/lateral leans;Sit to/from stand  Upper Body Dressing : Set up;Sitting   Lower Body Dressing: Contact guard assist;Sit to/from stand;Sitting/lateral leans Lower Body Dressing Details  (indicate cue type and reason): Set up in sitting to donn/doff socks and thread LE into clothing; CGA for safety in standing to bring clothing over hips Toilet Transfer: Contact guard assist;Ambulation;Rolling walker (2 wheels);BSC/3in1 Toilet Transfer Details (indicate cue type and reason): simulated bed/chair Toileting- Clothing Manipulation and Hygiene: Contact guard assist;Sit to/from stand Toileting - Clothing Manipulation Details (indicate cue type and reason): Set up for use of hand-held urinal in the bed; CGA for sit/stand     Functional mobility during ADLs: Contact guard assist;Rolling walker (2 wheels) (Pt performing limited funcitonal mobility in session with pt requesting return to bed due to pain in L LE.) General ADL Comments: Pt with decreased activity tolerance and pain affecting functional level.     Vision Patient Visual Report: No change from baseline       Perception         Praxis         Pertinent Vitals/Pain Pain Assessment Pain Assessment: Faces Faces Pain Scale: Hurts even more Pain Location: L knee to foot Pain Descriptors / Indicators: Throbbing, Guarding, Grimacing Pain Intervention(s): Limited activity within patient's tolerance, Monitored during session, Repositioned, Premedicated before session     Extremity/Trunk Assessment Upper Extremity Assessment Upper Extremity Assessment: RUE deficits/detail;Right hand dominant;Generalized weakness;LUE deficits/detail RUE Deficits / Details: generalized weakness; pt reports hx of R shoulder sx with intermittent pain with shoulder flexion and abduction LUE Deficits / Details: generalized weakness   Lower Extremity Assessment Lower Extremity Assessment: Defer to PT evaluation   Cervical / Trunk Assessment Cervical / Trunk Assessment: Normal   Communication Communication Communication: No apparent difficulties   Cognition Arousal: Alert Behavior During Therapy: WFL for tasks  assessed/performed Cognition: No apparent impairments             OT - Cognition Comments: AAOx4 and pleasant throughout session. Pt cognition WNL for tasks assessed. Cognition not formally assessed.                 Following commands: Intact       Cueing  General Comments   Cueing Techniques: Verbal cues  VSS on RA throughout session   Exercises     Shoulder Instructions      Home Living Family/patient expects to be discharged to:: Private residence Living Arrangements: Non-relatives/Friends Available Help at Discharge: Friend(s);Family;Available PRN/intermittently Type of Home: Apartment Home Access: Level entry     Home Layout: Two level;Bed/bath upstairs Alternate Level Stairs-Number of Steps: 7 + 7 Alternate Level Stairs-Rails: Right;Left;Can reach both Bathroom Shower/Tub: Chief Strategy Officer: Standard Bathroom Accessibility: Yes How Accessible: Accessible via walker Home Equipment: Rolling Walker (2 wheels);Gilmer Mor - single point   Additional Comments: lives with a friend that pt says is more like a landlord. has some siblings but likley not able to provide consistent assist      Prior Functioning/Environment Prior Level of Function : Independent/Modified Independent             Mobility Comments: cane for mobility outside of the home; pt reports 2 recent falls going up stairs at home due to pain in L LE ADLs Comments: Indep with ADLs, IADLs; pt does not drive, gets rides to stores    OT Problem List: Decreased strength;Decreased activity tolerance;Impaired balance (sitting and/or standing);Pain   OT Treatment/Interventions: Self-care/ADL training;Therapeutic exercise;Energy conservation;DME and/or AE instruction;Therapeutic activities;Patient/family education  OT Goals(Current goals can be found in the care plan section)   Acute Rehab OT Goals Patient Stated Goal: to have less pain in L LE and to return home OT Goal  Formulation: With patient Time For Goal Achievement: 04/24/23 Potential to Achieve Goals: Good ADL Goals Pt Will Perform Lower Body Bathing: with modified independence;sitting/lateral leans;sit to/from stand Pt Will Perform Lower Body Dressing: with modified independence;sitting/lateral leans;sit to/from stand Pt Will Transfer to Toilet: with modified independence;ambulating;regular height toilet (with least restrictive AD) Pt Will Perform Toileting - Clothing Manipulation and hygiene: with modified independence;sit to/from stand;sitting/lateral leans Pt/caregiver will Perform Home Exercise Program: Increased strength;Both right and left upper extremity;With theraband;Independently;With written HEP provided (Increased activity tolerance)   OT Frequency:  Min 2X/week    Co-evaluation              AM-PAC OT "6 Clicks" Daily Activity     Outcome Measure Help from another person eating meals?: None Help from another person taking care of personal grooming?: None (in sitting) Help from another person toileting, which includes using toliet, bedpan, or urinal?: A Little Help from another person bathing (including washing, rinsing, drying)?: A Little Help from another person to put on and taking off regular upper body clothing?: A Little Help from another person to put on and taking off regular lower body clothing?: A Little 6 Click Score: 20   End of Session Equipment Utilized During Treatment: Rolling walker (2 wheels);Gait belt Nurse Communication: Mobility status  Activity Tolerance: Patient tolerated treatment well;Patient limited by pain Patient left: in bed;with call bell/phone within reach;with bed alarm set  OT Visit Diagnosis: Other abnormalities of gait and mobility (R26.89);Muscle weakness (generalized) (M62.81);History of falling (Z91.81);Pain;Other (comment) (decreased activity tolerance)                Time: 4098-1191 OT Time Calculation (min): 16 min Charges:  OT  General Charges $OT Visit: 1 Visit OT Evaluation $OT Eval Low Complexity: 1 Low  [address]" M., OTR/L, MA Acute Rehab [phone]   Lendon Colonel 04/10/2023, 12:56 PM

## 2023-04-10 NOTE — Plan of Care (Signed)
  Problem: Pain Managment: Goal: General experience of comfort will improve and/or be controlled Outcome: Progressing

## 2023-04-10 NOTE — Evaluation (Signed)
 Physical Therapy Evaluation Patient Details Name: [name]. MRN: [id number] DOB: 16-Feb-1960 Today's Date: 04/10/2023  History of Present Illness  Pt is a 63 y.o. male who presented 04/08/23 for  recanalization of the left superficial femoral artery access in the left aortobifemoral bypass hood. PMH includes: aortobifemoral BPG, L common femoral-pop BPG, DM II, HLD, HTN, PVD, and tobacco use.   Clinical Impression  Pt in bed upon arrival of PT, agreeable to evaluation at this time. Prior to admission the pt was mostly spending time in his bed, but reports being able to get out of his bed and ambulate to the bathroom ~1x/day with use of cane. The pt reports intermittent assist available from friend/roommate as needed for IADLs. He was able to complete bed mobility without assistance and short bout of ambulation in the room with CGA-supervision and use of RW. He is only able to tolerate TDWB with LLE due to pain, is aware he can place full wt on LLE as tolerated. Will benefit from continued skilled PT to progress functional endurance, strength, and ROM in LLE as well as to continue to train stair navigation to improve safety with mobility at home. Will be safe to return home with intermittent assist and continued therapies once medically stable.     If plan is discharge home, recommend the following: A little help with walking and/or transfers;Assistance with cooking/housework;Assist for transportation;Help with stairs or ramp for entrance   Can travel by private vehicle        Equipment Recommendations None recommended by PT  Recommendations for Other Services       Functional Status Assessment Patient has had a recent decline in their functional status and demonstrates the ability to make significant improvements in function in a reasonable and predictable amount of time.     Precautions / Restrictions Precautions Precautions: Fall Recall of Precautions/Restrictions:  Intact Restrictions Weight Bearing Restrictions Per Provider Order: No      Mobility  Bed Mobility Overal bed mobility: Needs Assistance Bed Mobility: Supine to Sit     Supine to sit: Supervision, HOB elevated, Used rails     General bed mobility comments: no assist, increased time with use of bed features    Transfers Overall transfer level: Needs assistance Equipment used: Rolling walker (2 wheels) Transfers: Sit to/from Stand, Bed to chair/wheelchair/BSC Sit to Stand: Supervision   Step pivot transfers: Contact guard assist       General transfer comment: supervision to rise from EOB or chair, CGA for line management with pivot    Ambulation/Gait Ambulation/Gait assistance: Contact guard assist, Supervision Gait Distance (Feet): 35 Feet Assistive device: Rolling walker (2 wheels) Gait Pattern/deviations: Step-to pattern, Decreased stance time - left, Decreased weight shift to left Gait velocity: decreased Gait velocity interpretation: <1.31 ft/sec, indicative of household ambulator   General Gait Details: at times TDWB or NWB LLE due to pain, good stability with RW  Stairs Stairs:  (discussed verbally, pt reports no concerns)            Balance Overall balance assessment: Needs assistance Sitting-balance support: No upper extremity supported, Feet supported Sitting balance-Leahy Scale: Good Sitting balance - Comments: sitting EOB   Standing balance support: No upper extremity supported, Single extremity supported, Bilateral upper extremity supported, During functional activity Standing balance-Leahy Scale: Fair Standing balance comment: No overt LOB in static or dynamic standing but with mild blance deficits noted in dynamic stand/funcitonal mobility. Pt benefiting from use of RW for increased stability and  safety in dynamic standing/funcitonal mobility.                             Pertinent Vitals/Pain Pain Assessment Pain Assessment:  0-10 Pain Score: 8  Faces Pain Scale: Hurts even more Pain Location: L knee to foot Pain Descriptors / Indicators: Throbbing, Guarding, Grimacing Pain Intervention(s): Limited activity within patient's tolerance, Monitored during session, Premedicated before session, Repositioned    Home Living Family/patient expects to be discharged to:: Private residence Living Arrangements: Non-relatives/Friends Available Help at Discharge: Friend(s);Family;Available PRN/intermittently Type of Home: Apartment Home Access: Level entry     Alternate Level Stairs-Number of Steps: 7 + 7 Home Layout: Two level;Bed/bath upstairs Home Equipment: Rolling Walker (2 wheels);Gilmer Mor - single point Additional Comments: lives with a friend that pt says is more like a landlord. has some siblings but likley not able to provide consistent assist    Prior Function Prior Level of Function : Independent/Modified Independent             Mobility Comments: cane for mobility outside of the home; pt reports 2 recent falls going up stairs at home due to pain in L LE. mostly sedentary, in bed for majority of each day ADLs Comments: Indep with ADLs, IADLs; pt does not drive, gets rides to stores     Extremity/Trunk Assessment   Upper Extremity Assessment Upper Extremity Assessment: Defer to OT evaluation RUE Deficits / Details: generalized weakness; pt reports hx of R shoulder sx with intermittent pain with shoulder flexion and abduction LUE Deficits / Details: generalized weakness    Lower Extremity Assessment Lower Extremity Assessment: Generalized weakness;LLE deficits/detail LLE Deficits / Details: limited by pain, able to move against gravity at ankle and knee, grossly 3/5 to MMT (limited by pain) at hip LLE: Unable to fully assess due to pain LLE Sensation: decreased light touch (below knee) LLE Coordination: WNL    Cervical / Trunk Assessment Cervical / Trunk Assessment: Normal  Communication    Communication Communication: No apparent difficulties    Cognition Arousal: Alert Behavior During Therapy: WFL for tasks assessed/performed   PT - Cognitive impairments: No apparent impairments                         Following commands: Intact       Cueing Cueing Techniques: Verbal cues     General Comments General comments (skin integrity, edema, etc.): VSS on RA    Exercises General Exercises - Lower Extremity Ankle Circles/Pumps: AROM, Both, 10 reps Quad Sets: AROM, Both, 10 reps Long Arc Quad: AROM, Both, 10 reps   Assessment/Plan    PT Assessment Patient needs continued PT services  PT Problem List Decreased range of motion;Decreased strength;Decreased activity tolerance;Decreased balance;Decreased mobility;Decreased safety awareness       PT Treatment Interventions DME instruction;Stair training;Gait training;Functional mobility training;Therapeutic activities;Therapeutic exercise;Balance training;Cognitive remediation    PT Goals (Current goals can be found in the Care Plan section)  Acute Rehab PT Goals Patient Stated Goal: return home and move better in the home PT Goal Formulation: With patient Time For Goal Achievement: 04/24/23 Potential to Achieve Goals: Good    Frequency Min 2X/week        AM-PAC PT "6 Clicks" Mobility  Outcome Measure Help needed turning from your back to your side while in a flat bed without using bedrails?: None Help needed moving from lying on your back to sitting on the  side of a flat bed without using bedrails?: None Help needed moving to and from a bed to a chair (including a wheelchair)?: A Little Help needed standing up from a chair using your arms (e.g., wheelchair or bedside chair)?: A Little Help needed to walk in hospital room?: A Little Help needed climbing 3-5 steps with a railing? : A Little 6 Click Score: 20    End of Session Equipment Utilized During Treatment: Gait belt Activity Tolerance: Patient  tolerated treatment well Patient left: in chair;with call bell/phone within reach;with chair alarm set Nurse Communication: Mobility status PT Visit Diagnosis: Unsteadiness on feet (R26.81);Other abnormalities of gait and mobility (R26.89);Repeated falls (R29.6);Muscle weakness (generalized) (M62.81)    Time: 1610-9604 PT Time Calculation (min) (ACUTE ONLY): 29 min   Charges:   PT Evaluation $PT Eval Moderate Complexity: 1 Mod PT Treatments $Therapeutic Exercise: 8-22 mins PT General Charges $$ ACUTE PT VISIT: 1 Visit         Vickki Muff, PT, DPT   Acute Rehabilitation Department Office [phone] Secure Chat Communication Preferred  Ronnie Derby 04/10/2023, 1:43 PM

## 2023-04-10 NOTE — Discharge Summary (Signed)
 [facility] Discharge Summary   Patient ID:  [name]. MRN: [id number] DOB/AGE: 05/16/1960 63 y.o.  Admit date: 04/08/2023 Discharge date: 04/10/2023 Attending Surgeon: Lenell Antu Admission Diagnosis: Atherosclerosis of artery of extremity with rest pain [facility]) [I70.229]  Discharge Diagnoses:  Atherosclerosis of artery of extremity with rest pain [facility]) [I70.229]  Secondary Diagnoses: Past Medical History:  Diagnosis Date   Diabetes mellitus without complication (HCC)    Hyperlipidemia    Hypertension    Peripheral vascular disease (HCC)    Tobacco use     Procedures: 04/08/2023 Procedure(s): ABDOMINAL AORTOGRAM W/LOWER EXTREMITY LOWER EXTREMITY INTERVENTION  Discharged Condition: good  HPI:  Hospital Course:  [name].  is a 63 y.o. male who is s/p left common to below the knee popliteal bypass with vein on 12/22/2022 due to critical limb ischemia of the left leg with rest pain.  He has history of aortobifemoral bypass graft in 2022.  Rest pain of the left foot has resolved since surgery.  He believes his incisions are healing well.  He is still requiring pain medication to sleep and to increase his mobility during the day.  He is on a statin daily.  He also takes a aspirin daily.  It should also be noted that he has a right dorsal foot wound caused by cortisone injections about a year ago.  He has a known right SFA occlusion.   03/22/23: returns to clinic for evaluation. He has return of severe left foot pain. He reports severe right foot pain, but states this is less severe than his left foot.  His left calf incision has reopened.  On 04/08/23, the patient underwent recanalization of the left superficial femoral artery access in the left aortobifemoral bypass hood.  Procedure was successful.  The patient reports resolution of rest pain this morning.    Consults:    Significant Diagnostic Studies: CBC    Component Value Date/Time   WBC  6.6 04/08/2023 1417   RBC 4.85 04/08/2023 1417   HGB 13.7 04/08/2023 1417   HGB 11.7 (L) 12/12/2019 1006   HCT 42.9 04/08/2023 1417   HCT 36.2 (L) 12/12/2019 1006   PLT 234 04/08/2023 1417   PLT 249 12/12/2019 1006   MCV 88.5 04/08/2023 1417   MCV 90 12/12/2019 1006   MCH 28.2 04/08/2023 1417   MCHC 31.9 04/08/2023 1417   RDW 16.4 (H) 04/08/2023 1417   RDW 14.5 12/12/2019 1006   LYMPHSABS 1.2 11/10/2020 1350   LYMPHSABS 1.5 07/31/2019 1401   MONOABS 0.8 11/10/2020 1350   EOSABS 0.1 11/10/2020 1350   EOSABS 0.2 07/31/2019 1401   BASOSABS 0.0 11/10/2020 1350   BASOSABS 0.0 07/31/2019 1401    BMET    Component Value Date/Time   NA 139 04/08/2023 1417   NA 136 12/12/2019 1006   K 3.6 04/08/2023 1417   CL 105 04/08/2023 1417   CO2 22 04/08/2023 1417   GLUCOSE 116 (H) 04/08/2023 1417   BUN 12 04/08/2023 1417   BUN 18 12/12/2019 1006   CREATININE 1.10 04/08/2023 1417   CALCIUM 9.0 04/08/2023 1417   GFRNONAA >60 04/08/2023 1417   GFRAA 82 12/12/2019 1006    COAG estimated creatinine clearance is 45.9 mL/min (by C-G formula based on SCr of 1.1 mg/dL).  No results found for: "PTT"  Disposition:  Discharge to :Home Discharge Instructions     Ambulatory Referral for Lung Cancer Scre   Complete by: As directed    Call  MD for:  redness, tenderness, or signs of infection (pain, swelling, bleeding, redness, odor or green/yellow discharge around incision site)   Complete by: As directed    Call MD for:  severe or increased pain, loss or decreased feeling  in affected limb(s)   Complete by: As directed    Call MD for:  temperature >100.5   Complete by: As directed    Resume previous diet   Complete by: As directed       Allergies as of 04/10/2023   No Known Allergies      Medication List     TAKE these medications    aspirin EC 81 MG tablet Take 1 tablet (81 mg total) by mouth daily. Swallow whole.   atorvastatin 80 MG tablet Commonly known as: LIPITOR Take 1  tablet (80 mg total) by mouth at bedtime.   Blood Pressure Monitor Kit Check blood pressure daily What changed:  how much to take how to take this when to take this additional instructions   clopidogrel 75 MG tablet Commonly known as: Plavix Take 1 tablet (75 mg total) by mouth daily.   DULoxetine 30 MG capsule Commonly known as: CYMBALTA Take 30 mg by mouth daily in the afternoon.   gabapentin 600 MG tablet Commonly known as: NEURONTIN Take 1 tablet (600 mg) in morning and afternoon. Take 2 tablets (1200 mg) at night What changed:  how much to take how to take this when to take this additional instructions   lisinopril 10 MG tablet Commonly known as: ZESTRIL Take 10 mg by mouth daily.   multivitamin with minerals Tabs tablet Take 1 tablet by mouth daily.   oxyCODONE 5 MG immediate release tablet Commonly known as: Oxy IR/ROXICODONE Take 1 tablet (5 mg total) by mouth every 4 (four) hours as needed for moderate pain (pain score 4-6).   oxyCODONE 5 MG immediate release tablet Commonly known as: Oxy IR/ROXICODONE Take 1 tablet (5 mg total) by mouth every 8 (eight) hours as needed for severe pain (pain score 7-10).   pantoprazole 20 MG tablet Commonly known as: PROTONIX Take 20 mg by mouth daily in the afternoon.   Vitamin D3 Ultra Potency 1250 MCG Tabs Generic drug: Cholecalciferol Take 50,000 Units by mouth once a week. Thursdays or Fridays        Verbal and written Discharge instructions given to the patient. Wound care per Discharge AVS  Rande Brunt. Lenell Antu, MD [facility] Phone Number: [phone] 04/10/2023 1:20 PM

## 2023-04-10 NOTE — Progress Notes (Addendum)
 DISCHARGE NOTE HOME [name]. to be discharged Home per MD order. Discussed prescriptions and follow up appointments with the patient. Prescriptions given to patient; medication list explained in detail. Patient verbalized understanding.  Skin clean, dry and intact without evidence of skin break down, no evidence of skin tears noted. IV catheter discontinued intact. Site without signs and symptoms of complications. Dressing and pressure applied. Pt denies pain at the site currently. No complaints noted.  Patient free of lines, drains, and wounds. Other than noted on LDA  An After Visit Summary (AVS) was printed and given to the patient. Patient escorted via wheelchair, and discharged home via private auto.  Velia Meyer, RN

## 2023-04-10 NOTE — Progress Notes (Signed)
 Patient ID: Ruben Desena., male   DOB: 1960-12-16, 63 y.o.   MRN: [id number]  Patient seen on morning rounds.  He was doing well after angiogram.  No access site issues in the left groin.  There is a weakly palpable anterior tibial pulse in the left leg.  The left medial calf wound is dry without evidence of infection.  Kahlel and I made a plan for discharge.  Later, the nurse reported to me that the patient was unwilling to walk.  I canceled his discharge and made arrangements for physical therapy to see him.  I did stop all narcotic pain medicine and gabapentin.  I will reevaluate him in the morning for suitability for discharge.  Rande Brunt. Lenell Antu, MD [facility] Phone Number: [phone] 04/10/2023 1:23 PM

## 2023-04-11 ENCOUNTER — Encounter (HOSPITAL_COMMUNITY): Payer: Self-pay | Admitting: Vascular Surgery

## 2023-04-27 ENCOUNTER — Telehealth: Payer: Self-pay

## 2023-04-27 DIAGNOSIS — Z122 Encounter for screening for malignant neoplasm of respiratory organs: Secondary | ICD-10-CM

## 2023-04-27 DIAGNOSIS — F17210 Nicotine dependence, cigarettes, uncomplicated: Secondary | ICD-10-CM

## 2023-04-27 DIAGNOSIS — Z87891 Personal history of nicotine dependence: Secondary | ICD-10-CM

## 2023-04-27 NOTE — Telephone Encounter (Signed)
.  Lung Cancer Screening Narrative/Criteria Questionnaire (Cigarette Smokers Only- No Cigars/Pipes/vapes)   [employer].   SDMV:05/31/2023 at 12:00 pm with  07-12-1960   LDCT: 06/01/2023 at 12:00 pm at GI    63 y.o.   Phone: [phone]  Lung Screening Narrative (confirm age 73-77 yrs Medicare / 50-80 yrs Private pay insurance)   Insurance information: [employer]   Referring Provider: Tiburcio Pea, NP   This screening involves an initial phone call with a team member from our program. It is called a shared decision making visit. The initial meeting is required by  insurance and Medicare to make sure you understand the program. This appointment takes about 15-20 minutes to complete. You will complete the screening scan at your scheduled date/time.  This scan takes about 5-10 minutes to complete. You can eat and drink normally before and after the scan.  Criteria questions for Lung Cancer Screening:   Are you a current or former smoker? Current Age began smoking: 37   If you are a former smoker, what year did you quit smoking? (within 15 yrs)   To calculate your smoking history, I need an accurate estimate of how many packs of cigarettes you smoked per day and for how many years. (Not just the number of PPD you are now smoking)   Years smoking 47 x Packs per day .5 = Pack years 23.5   (at least 20 pack yrs)   (Make sure they understand that we need to know how much they have smoked in the past, not just the number of PPD they are smoking now)  Do you have a personal history of cancer?  No    Do you have a family history of cancer? No  Are you coughing up blood?  No  Have you had unexplained weight loss of 15 lbs or more in the last 6 months? No  It looks like you meet all criteria.  When would be a good time for [place] to schedule you for this screening?   Additional information: N/A

## 2023-05-09 ENCOUNTER — Other Ambulatory Visit: Payer: Self-pay

## 2023-05-09 DIAGNOSIS — I70222 Atherosclerosis of native arteries of extremities with rest pain, left leg: Secondary | ICD-10-CM

## 2023-05-18 ENCOUNTER — Encounter (HOSPITAL_COMMUNITY)

## 2023-05-18 ENCOUNTER — Ambulatory Visit (HOSPITAL_COMMUNITY)

## 2023-05-18 ENCOUNTER — Ambulatory Visit (HOSPITAL_COMMUNITY): Attending: Vascular Surgery

## 2023-05-24 ENCOUNTER — Ambulatory Visit

## 2023-05-31 ENCOUNTER — Ambulatory Visit: Admitting: Adult Health

## 2023-05-31 ENCOUNTER — Encounter: Payer: Self-pay | Admitting: Adult Health

## 2023-05-31 DIAGNOSIS — F17210 Nicotine dependence, cigarettes, uncomplicated: Secondary | ICD-10-CM

## 2023-05-31 NOTE — Patient Instructions (Signed)

## 2023-05-31 NOTE — Progress Notes (Signed)
  Virtual Visit via Telephone Note  I connected with [name]. , 05/31/23 12:08 PM by a telemedicine application and verified that I am speaking with the correct person using two identifiers.  Location: Patient: home Provider: home   I discussed the limitations of evaluation and management by telemedicine and the availability of in person appointments. The patient expressed understanding and agreed to proceed.   Shared Decision Making Visit Lung Cancer Screening Program [phone])   Eligibility: 63 y.o. Pack Years Smoking History Calculation = 24 pack years  (# packs/per year x # years smoked) Recent History of coughing up blood  no Unexplained weight loss? no ( >Than 15 pounds within the last 6 months ) Prior History Lung / other cancer no (Diagnosis within the last 5 years already requiring surveillance chest CT Scans). Smoking Status Current Smoker  Visit Components: Discussion included one or more decision making aids. YES Discussion included risk/benefits of screening. YES Discussion included potential follow up diagnostic testing for abnormal scans. YES Discussion included meaning and risk of over diagnosis. YES Discussion included meaning and risk of False Positives. YES Discussion included meaning of total radiation exposure. YES  Counseling Included: Importance of adherence to annual lung cancer LDCT screening. YES Impact of comorbidities on ability to participate in the program. YES Ability and willingness to under diagnostic treatment. YES  Smoking Cessation Counseling: Current Smokers:  Discussed importance of smoking cessation. yes Information about tobacco cessation classes and interventions provided to patient. yes Patient provided with "ticket" for LDCT Scan. yes Symptomatic Patient. NO Diagnosis Code: Tobacco Use Z72.0 Asymptomatic Patient yes  Counseling - 4 minutes of smoking cessation counseling  (CT Chest Lung Cancer Screening Low Dose  W/O CM) [id number]  Z12.2-Screening of respiratory organs Z87.891-Personal history of nicotine  dependence   [place]ullen Dose 05/31/23

## 2023-06-01 ENCOUNTER — Ambulatory Visit
Admission: RE | Admit: 2023-06-01 | Discharge: 2023-06-01 | Disposition: A | Discharge status: Home / Self Care | Source: Ambulatory Visit | Attending: Nurse Practitioner

## 2023-06-01 DIAGNOSIS — F17210 Nicotine dependence, cigarettes, uncomplicated: Secondary | ICD-10-CM

## 2023-06-01 DIAGNOSIS — Z87891 Personal history of nicotine dependence: Secondary | ICD-10-CM

## 2023-06-01 DIAGNOSIS — Z122 Encounter for screening for malignant neoplasm of respiratory organs: Secondary | ICD-10-CM

## 2023-06-02 ENCOUNTER — Encounter (HOSPITAL_BASED_OUTPATIENT_CLINIC_OR_DEPARTMENT_OTHER): Attending: Internal Medicine | Admitting: Internal Medicine

## 2023-06-02 DIAGNOSIS — E11622 Type 2 diabetes mellitus with other skin ulcer: Secondary | ICD-10-CM | POA: Diagnosis present

## 2023-06-02 DIAGNOSIS — L97822 Non-pressure chronic ulcer of other part of left lower leg with fat layer exposed: Secondary | ICD-10-CM | POA: Diagnosis not present

## 2023-06-02 DIAGNOSIS — Z720 Tobacco use: Secondary | ICD-10-CM | POA: Diagnosis not present

## 2023-06-02 DIAGNOSIS — E1151 Type 2 diabetes mellitus with diabetic peripheral angiopathy without gangrene: Secondary | ICD-10-CM | POA: Insufficient documentation

## 2023-06-02 DIAGNOSIS — I70249 Atherosclerosis of native arteries of left leg with ulceration of unspecified site: Secondary | ICD-10-CM | POA: Insufficient documentation

## 2023-06-02 DIAGNOSIS — Z95820 Peripheral vascular angioplasty status with implants and grafts: Secondary | ICD-10-CM | POA: Insufficient documentation

## 2023-06-02 DIAGNOSIS — F17200 Nicotine dependence, unspecified, uncomplicated: Secondary | ICD-10-CM | POA: Diagnosis not present

## 2023-06-06 ENCOUNTER — Ambulatory Visit: Payer: 59 | Admitting: Podiatry

## 2023-06-16 ENCOUNTER — Encounter (HOSPITAL_BASED_OUTPATIENT_CLINIC_OR_DEPARTMENT_OTHER): Admitting: Internal Medicine

## 2023-06-16 DIAGNOSIS — E11622 Type 2 diabetes mellitus with other skin ulcer: Secondary | ICD-10-CM | POA: Diagnosis not present

## 2023-06-16 DIAGNOSIS — L97822 Non-pressure chronic ulcer of other part of left lower leg with fat layer exposed: Secondary | ICD-10-CM

## 2023-06-30 ENCOUNTER — Encounter (HOSPITAL_BASED_OUTPATIENT_CLINIC_OR_DEPARTMENT_OTHER): Attending: Internal Medicine | Admitting: Internal Medicine

## 2023-06-30 DIAGNOSIS — E11622 Type 2 diabetes mellitus with other skin ulcer: Secondary | ICD-10-CM | POA: Insufficient documentation

## 2023-06-30 DIAGNOSIS — L97822 Non-pressure chronic ulcer of other part of left lower leg with fat layer exposed: Secondary | ICD-10-CM | POA: Diagnosis present

## 2023-06-30 DIAGNOSIS — I70249 Atherosclerosis of native arteries of left leg with ulceration of unspecified site: Secondary | ICD-10-CM | POA: Insufficient documentation

## 2023-06-30 DIAGNOSIS — Z720 Tobacco use: Secondary | ICD-10-CM | POA: Diagnosis not present

## 2023-07-11 ENCOUNTER — Telehealth: Payer: Self-pay | Admitting: Acute Care

## 2023-07-11 NOTE — Telephone Encounter (Signed)
 Attempted to call patient and review results. LVM.

## 2023-07-11 NOTE — Telephone Encounter (Signed)
 Dara Ear NP has reviewed the LDCT results and has recommended patient have a pulmonary referral for the pulmonary sequestration noted on the scan. Patient is fine to have annual LDCT. Will need to call patient and notify him of the results and recs. Will need to send PCP results and plan.    IMPRESSION: 1. Lung-RADS 1, negative. Continue annual screening with low-dose chest CT without contrast in 12 months. 2. 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 3. Left colonic diverticulosis. 4. Aortic Atherosclerosis (ICD10-I70.0) and Emphysema (ICD10-J43.9).     Electronically Signed   By: Levell Reach M.D.   On: 06/25/2023 17:01

## 2023-07-14 ENCOUNTER — Other Ambulatory Visit: Payer: Self-pay

## 2023-07-14 DIAGNOSIS — Z122 Encounter for screening for malignant neoplasm of respiratory organs: Secondary | ICD-10-CM

## 2023-07-14 DIAGNOSIS — Z87891 Personal history of nicotine dependence: Secondary | ICD-10-CM

## 2023-07-14 DIAGNOSIS — F17210 Nicotine dependence, cigarettes, uncomplicated: Secondary | ICD-10-CM

## 2023-07-14 NOTE — Telephone Encounter (Signed)
 I have called the patient and reviewed his recent Lung CT results. Pt will complete an annual Lung CT again next year. Order placed. Advises he will f/u with his PCP, Vevelyn Gowers and discuss congenital pulmonary sequestration and the left colonic diverticulosis. Denies pain, fever or change in bowel patterns. Advises he will discuss addtitional findings with PCP and request referrals if needed. Results and plan to PCP. No additional needs.

## 2023-09-14 ENCOUNTER — Ambulatory Visit (HOSPITAL_BASED_OUTPATIENT_CLINIC_OR_DEPARTMENT_OTHER)
Admission: RE | Admit: 2023-09-14 | Discharge: 2023-09-14 | Disposition: A | Discharge status: Home / Self Care | Source: Ambulatory Visit | Attending: Vascular Surgery | Admitting: Vascular Surgery

## 2023-09-14 ENCOUNTER — Ambulatory Visit (HOSPITAL_COMMUNITY)
Admission: RE | Admit: 2023-09-14 | Discharge: 2023-09-14 | Disposition: A | Discharge status: Home / Self Care | Source: Ambulatory Visit | Attending: Vascular Surgery | Admitting: Vascular Surgery

## 2023-09-14 DIAGNOSIS — I70222 Atherosclerosis of native arteries of extremities with rest pain, left leg: Secondary | ICD-10-CM | POA: Insufficient documentation

## 2023-09-14 LAB — VAS US ABI WITH/WO TBI
Left ABI: 0.82
Right ABI: 0.57

## 2023-10-10 ENCOUNTER — Other Ambulatory Visit: Payer: Self-pay | Admitting: Physician Assistant

## 2023-10-10 ENCOUNTER — Ambulatory Visit: Attending: Vascular Surgery | Admitting: Physician Assistant

## 2023-10-10 VITALS — BP 148/84 | HR 87 | Temp 97.7°F | Wt 114.5 lb

## 2023-10-10 DIAGNOSIS — I70235 Atherosclerosis of native arteries of right leg with ulceration of other part of foot: Secondary | ICD-10-CM

## 2023-10-10 DIAGNOSIS — I70222 Atherosclerosis of native arteries of extremities with rest pain, left leg: Secondary | ICD-10-CM | POA: Diagnosis not present

## 2023-10-10 MED ORDER — OXYCODONE HCL 5 MG PO TABS: 5.0000 mg | ORAL_TABLET | Freq: Three times a day (TID) | ORAL | 0 refills | Status: AC | PRN

## 2023-10-11 ENCOUNTER — Other Ambulatory Visit: Payer: Self-pay

## 2023-10-11 DIAGNOSIS — I70229 Atherosclerosis of native arteries of extremities with rest pain, unspecified extremity: Secondary | ICD-10-CM

## 2023-10-13 NOTE — H&P (View-Only) (Signed)
 Office Note   History of Present Illness   [name]. is a 63 y.o. (02-May-1960) male who presents for follow up. He has a history of aortobifemoral bypass in 2022 by Dr.Hawken. He also has a history of left common to below knee popliteal bypass by Dr.Hawken on 12/22/2022 for CLI with rest pain. This bypass occluded shortly after and therefore the patient required LLE angiogram with left fem-pop angioplasty and stenting on 04/08/2023 by Dr.Hawken. His rest pain resolved after his angiogram. 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   He returns today for follow up. He says he is not feeling great at today's visit. He says that his rest pain in the left foot was gone for about 2 weeks after his recent angiogram, however now he feels like it has come back. He says he has throbbing pain in his left leg that starts behind the knee and then radiates into his left foot. This hurts him all throughout the day and gets worse at night. When he lays in bed at night he has to hang his left foot off of the bed for some relief. He denies any numbness or wounds of the left foot. He also still has a dry ulceration to the right dorsal foot, which has been present for several months. This wound has not gotten smaller since it started. He denies any drainage from this area. He says that his right foot will also hurt at night due to his wound.   He continues to take daily aspirin , plavix , and lipitor .   Current Outpatient Medications  Medication Sig Dispense Refill   aspirin  EC 81 MG tablet Take 1 tablet (81 mg total) by mouth daily. Swallow whole.     atorvastatin  (LIPITOR ) 80 MG tablet Take 1 tablet (80 mg total) by mouth at bedtime. 30 tablet 1   Blood Pressure Monitor KIT Check blood pressure daily (Patient taking differently: 1 each by Other route once a week.) 1 kit 0   Cholecalciferol (VITAMIN D3 ULTRA POTENCY) 1.25 MG (50000 UT) TABS Take 50,000 Units by mouth once a  week. Thursdays or Fridays     clopidogrel  (PLAVIX ) 75 MG tablet Take 1 tablet (75 mg total) by mouth daily. 30 tablet 11   DULoxetine  (CYMBALTA ) 30 MG capsule Take 30 mg by mouth daily in the afternoon.     gabapentin  (NEURONTIN ) 600 MG tablet Take 1 tablet (600 mg) in morning and afternoon. Take 2 tablets (1200 mg) at night (Patient taking differently: Take 600 mg by mouth 3 (three) times daily.) 120 tablet 0   lisinopril  (ZESTRIL ) 10 MG tablet Take 10 mg by mouth daily.     Multiple Vitamin (MULTIVITAMIN WITH MINERALS) TABS tablet Take 1 tablet by mouth daily.     oxyCODONE  (OXY IR/ROXICODONE ) 5 MG immediate release tablet Take 1 tablet (5 mg total) by mouth every 8 (eight) hours as needed for severe pain (pain score 7-10). 20 tablet 0   pantoprazole  (PROTONIX ) 20 MG tablet Take 20 mg by mouth daily in the afternoon.     Current Facility-Administered Medications  Medication Dose Route Frequency Provider Last Rate Last Admin   sodium chloride  flush (NS) 0.9 % injection 3 mL  3 mL Intravenous Q12H Court Dorn JINNY, MD        REVIEW OF SYSTEMS (negative unless checked):   Cardiac:  []  Chest pain or chest pressure? []  Shortness of breath upon activity? []  Shortness of  breath when lying flat? []  Irregular heart rhythm?  Vascular:  []  Pain in calf, thigh, or hip brought on by walking? [x]  Pain in feet at night that wakes you up from your sleep? []  Blood clot in your veins? []  Leg swelling?  Pulmonary:  []  Oxygen at home? []  Productive cough? []  Wheezing?  Neurologic:  []  Sudden weakness in arms or legs? []  Sudden numbness in arms or legs? []  Sudden onset of difficult speaking or slurred speech? []  Temporary loss of vision in one eye? []  Problems with dizziness?  Gastrointestinal:  []  Blood in stool? []  Vomited blood?  Genitourinary:  []  Burning when urinating? []  Blood in urine?  Psychiatric:  []  Major depression  Hematologic:  []  Bleeding problems? []  Problems with  blood clotting?  Dermatologic:  []  Rashes or ulcers?  Constitutional:  []  Fever or chills?  Ear/Nose/Throat:  []  Change in hearing? []  Nose bleeds? []  Sore throat?  Musculoskeletal:  []  Back pain? []  Joint pain? []  Muscle pain?   Physical Examination   Vitals:   10/10/23 1308  BP: (!) 148/84  Pulse: 87  Temp: 97.7 F (36.5 C)  TempSrc: Temporal  Weight: 114 lb 8 oz (51.9 kg)   Body mass index is 20.94 kg/m.  General:  WDWN in NAD; vital signs documented above Gait: Not observed HENT: WNL, normocephalic Pulmonary: normal non-labored breathing , without rales, rhonchi,  wheezing Cardiac: regular Abdomen: soft, NT, no masses Skin: without rashes Vascular Exam/Pulses: Monophasic DP/PT doppler signals bilaterally, L>R. Palpable femoral pulses bilaterally Extremities: dry ulceration to the dorsal right foot Musculoskeletal: no muscle wasting or atrophy  Neurologic: A&O X 3;  No focal weakness or paresthesias are detected Psychiatric:  The pt has Normal affect.    Non-Invasive Vascular imaging   ABI (09/14/2023) R:  ABI: 0.57 (0.7),  PT: mono DP: none TBI:  0 L:  ABI: 0.82 (0.39),  PT: none DP: mono TBI: 0.57  LLE Arterial Duplex (09/14/2023) Patent left SFA-pop stent without stenosis  Medical Decision Making   [name]. is a 63 y.o. male who presents for follow up  The patient has a history of aortobifemoral bypass, followed by a left fem-pop bypass for CLI. His bypass occluded shortly after creation and he underwent LLE angiogram with native left fem-pop angioplasty and stenting on 04/08/2023 His studies performed on 09/14/2023 demonstrate improved ABIs on the left from 0.39 to 0.82. His ABIs on the right have decreased to 0.57 with a toe pressure of 0.  LLE arterial duplex demonstrates a patent left fem-pop stent without stenosis Unfortunately the patient says that his rest pain in the left foot returned about 2 weeks after his recent  angiogram. He says that his left leg hurts from behind the knee and into the foot. This bothers him especially at night and is somewhat improved by hanging his leg off the bed.  The patient also has a non healing wound of the right dorsal foot that has been present for months. This ulceration has not decreased in size with time.  On exam he has palpable femoral pulses and nonpalpable pedal pulses. He has monophasic DP/PT doppler signals, L>R Given the patient's studies and exam, he does appear to have critical limb ischemia of BLE with tissue loss on the right and rest pain on the left. His toe pressures are 0 on the right which is not enough for wound healing.  He would benefit from angiogram in the near future for his critical limb  ischemia. This would be a left femoral approach with the main focus on the right lower extremity. This may include popliteal or tibial intervention, however the patient is aware that he may not be a candidate for endovascular revascularization. Angiogram may ultimately demonstrate that he needs open revascularization in hopes to heal his right foot wound. Hopefully during the patient's angiogram his left leg can also be studied to evaluate the patency of his fem-pop stents. This would be done in efforts to improve his rest pain on the left.  He is agreeable to angiogram. He will continue aspirin , plavix , and statin. We will schedule this with Dr.Hawken in the next couple of weeks.    Ahmed Holster PA-C [facility] [place] Office: [phone]  Clinic MD: [place]

## 2023-10-13 NOTE — Progress Notes (Signed)
 Office Note   History of Present Illness   [name]. is a 63 y.o. (02-May-1960) male who presents for follow up. He has a history of aortobifemoral bypass in 2022 by Dr.Hawken. He also has a history of left common to below knee popliteal bypass by Dr.Hawken on 12/22/2022 for CLI with rest pain. This bypass occluded shortly after and therefore the patient required LLE angiogram with left fem-pop angioplasty and stenting on 04/08/2023 by Dr.Hawken. His rest pain resolved after his angiogram. 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   He returns today for follow up. He says he is not feeling great at today's visit. He says that his rest pain in the left foot was gone for about 2 weeks after his recent angiogram, however now he feels like it has come back. He says he has throbbing pain in his left leg that starts behind the knee and then radiates into his left foot. This hurts him all throughout the day and gets worse at night. When he lays in bed at night he has to hang his left foot off of the bed for some relief. He denies any numbness or wounds of the left foot. He also still has a dry ulceration to the right dorsal foot, which has been present for several months. This wound has not gotten smaller since it started. He denies any drainage from this area. He says that his right foot will also hurt at night due to his wound.   He continues to take daily aspirin , plavix , and lipitor .   Current Outpatient Medications  Medication Sig Dispense Refill   aspirin  EC 81 MG tablet Take 1 tablet (81 mg total) by mouth daily. Swallow whole.     atorvastatin  (LIPITOR ) 80 MG tablet Take 1 tablet (80 mg total) by mouth at bedtime. 30 tablet 1   Blood Pressure Monitor KIT Check blood pressure daily (Patient taking differently: 1 each by Other route once a week.) 1 kit 0   Cholecalciferol (VITAMIN D3 ULTRA POTENCY) 1.25 MG (50000 UT) TABS Take 50,000 Units by mouth once a  week. Thursdays or Fridays     clopidogrel  (PLAVIX ) 75 MG tablet Take 1 tablet (75 mg total) by mouth daily. 30 tablet 11   DULoxetine  (CYMBALTA ) 30 MG capsule Take 30 mg by mouth daily in the afternoon.     gabapentin  (NEURONTIN ) 600 MG tablet Take 1 tablet (600 mg) in morning and afternoon. Take 2 tablets (1200 mg) at night (Patient taking differently: Take 600 mg by mouth 3 (three) times daily.) 120 tablet 0   lisinopril  (ZESTRIL ) 10 MG tablet Take 10 mg by mouth daily.     Multiple Vitamin (MULTIVITAMIN WITH MINERALS) TABS tablet Take 1 tablet by mouth daily.     oxyCODONE  (OXY IR/ROXICODONE ) 5 MG immediate release tablet Take 1 tablet (5 mg total) by mouth every 8 (eight) hours as needed for severe pain (pain score 7-10). 20 tablet 0   pantoprazole  (PROTONIX ) 20 MG tablet Take 20 mg by mouth daily in the afternoon.     Current Facility-Administered Medications  Medication Dose Route Frequency Provider Last Rate Last Admin   sodium chloride  flush (NS) 0.9 % injection 3 mL  3 mL Intravenous Q12H Court Dorn JINNY, MD        REVIEW OF SYSTEMS (negative unless checked):   Cardiac:  []  Chest pain or chest pressure? []  Shortness of breath upon activity? []  Shortness of  breath when lying flat? []  Irregular heart rhythm?  Vascular:  []  Pain in calf, thigh, or hip brought on by walking? [x]  Pain in feet at night that wakes you up from your sleep? []  Blood clot in your veins? []  Leg swelling?  Pulmonary:  []  Oxygen at home? []  Productive cough? []  Wheezing?  Neurologic:  []  Sudden weakness in arms or legs? []  Sudden numbness in arms or legs? []  Sudden onset of difficult speaking or slurred speech? []  Temporary loss of vision in one eye? []  Problems with dizziness?  Gastrointestinal:  []  Blood in stool? []  Vomited blood?  Genitourinary:  []  Burning when urinating? []  Blood in urine?  Psychiatric:  []  Major depression  Hematologic:  []  Bleeding problems? []  Problems with  blood clotting?  Dermatologic:  []  Rashes or ulcers?  Constitutional:  []  Fever or chills?  Ear/Nose/Throat:  []  Change in hearing? []  Nose bleeds? []  Sore throat?  Musculoskeletal:  []  Back pain? []  Joint pain? []  Muscle pain?   Physical Examination   Vitals:   10/10/23 1308  BP: (!) 148/84  Pulse: 87  Temp: 97.7 F (36.5 C)  TempSrc: Temporal  Weight: 114 lb 8 oz (51.9 kg)   Body mass index is 20.94 kg/m.  General:  WDWN in NAD; vital signs documented above Gait: Not observed HENT: WNL, normocephalic Pulmonary: normal non-labored breathing , without rales, rhonchi,  wheezing Cardiac: regular Abdomen: soft, NT, no masses Skin: without rashes Vascular Exam/Pulses: Monophasic DP/PT doppler signals bilaterally, L>R. Palpable femoral pulses bilaterally Extremities: dry ulceration to the dorsal right foot Musculoskeletal: no muscle wasting or atrophy  Neurologic: A&O X 3;  No focal weakness or paresthesias are detected Psychiatric:  The pt has Normal affect.    Non-Invasive Vascular imaging   ABI (09/14/2023) R:  ABI: 0.57 (0.7),  PT: mono DP: none TBI:  0 L:  ABI: 0.82 (0.39),  PT: none DP: mono TBI: 0.57  LLE Arterial Duplex (09/14/2023) Patent left SFA-pop stent without stenosis  Medical Decision Making   [name]. is a 63 y.o. male who presents for follow up  The patient has a history of aortobifemoral bypass, followed by a left fem-pop bypass for CLI. His bypass occluded shortly after creation and he underwent LLE angiogram with native left fem-pop angioplasty and stenting on 04/08/2023 His studies performed on 09/14/2023 demonstrate improved ABIs on the left from 0.39 to 0.82. His ABIs on the right have decreased to 0.57 with a toe pressure of 0.  LLE arterial duplex demonstrates a patent left fem-pop stent without stenosis Unfortunately the patient says that his rest pain in the left foot returned about 2 weeks after his recent  angiogram. He says that his left leg hurts from behind the knee and into the foot. This bothers him especially at night and is somewhat improved by hanging his leg off the bed.  The patient also has a non healing wound of the right dorsal foot that has been present for months. This ulceration has not decreased in size with time.  On exam he has palpable femoral pulses and nonpalpable pedal pulses. He has monophasic DP/PT doppler signals, L>R Given the patient's studies and exam, he does appear to have critical limb ischemia of BLE with tissue loss on the right and rest pain on the left. His toe pressures are 0 on the right which is not enough for wound healing.  He would benefit from angiogram in the near future for his critical limb  ischemia. This would be a left femoral approach with the main focus on the right lower extremity. This may include popliteal or tibial intervention, however the patient is aware that he may not be a candidate for endovascular revascularization. Angiogram may ultimately demonstrate that he needs open revascularization in hopes to heal his right foot wound. Hopefully during the patient's angiogram his left leg can also be studied to evaluate the patency of his fem-pop stents. This would be done in efforts to improve his rest pain on the left.  He is agreeable to angiogram. He will continue aspirin , plavix , and statin. We will schedule this with Dr.Hawken in the next couple of weeks.    Ahmed Holster PA-C [facility] [place] Office: [phone]  Clinic MD: [place]

## 2023-10-18 ENCOUNTER — Telehealth: Payer: Self-pay

## 2023-10-18 NOTE — Telephone Encounter (Signed)
 Pt called to f/u on his pain medication rx. He has not received it from his pharmacy that delivers to him. I have given him their direct number to f/u. No further questions/concerns at this time.

## 2023-10-28 ENCOUNTER — Observation Stay (HOSPITAL_COMMUNITY)
Admission: RE | Admit: 2023-10-28 | Discharge: 2023-10-29 | Disposition: A | Discharge status: Home / Self Care | Attending: Vascular Surgery | Admitting: Vascular Surgery

## 2023-10-28 ENCOUNTER — Other Ambulatory Visit: Payer: Self-pay

## 2023-10-28 ENCOUNTER — Encounter (HOSPITAL_COMMUNITY)
Admission: RE | Disposition: A | Discharge status: Home / Self Care | Payer: Self-pay | Source: Home / Self Care | Attending: Vascular Surgery

## 2023-10-28 ENCOUNTER — Encounter (HOSPITAL_COMMUNITY): Payer: Self-pay | Admitting: Vascular Surgery

## 2023-10-28 DIAGNOSIS — I70235 Atherosclerosis of native arteries of right leg with ulceration of other part of foot: Principal | ICD-10-CM | POA: Insufficient documentation

## 2023-10-28 DIAGNOSIS — Z7982 Long term (current) use of aspirin: Secondary | ICD-10-CM | POA: Insufficient documentation

## 2023-10-28 DIAGNOSIS — I739 Peripheral vascular disease, unspecified: Principal | ICD-10-CM | POA: Diagnosis present

## 2023-10-28 DIAGNOSIS — M79605 Pain in left leg: Secondary | ICD-10-CM | POA: Insufficient documentation

## 2023-10-28 DIAGNOSIS — L97919 Non-pressure chronic ulcer of unspecified part of right lower leg with unspecified severity: Secondary | ICD-10-CM | POA: Diagnosis not present

## 2023-10-28 DIAGNOSIS — Z95820 Peripheral vascular angioplasty status with implants and grafts: Secondary | ICD-10-CM | POA: Insufficient documentation

## 2023-10-28 DIAGNOSIS — I70239 Atherosclerosis of native arteries of right leg with ulceration of unspecified site: Secondary | ICD-10-CM

## 2023-10-28 DIAGNOSIS — Z79899 Other long term (current) drug therapy: Secondary | ICD-10-CM | POA: Insufficient documentation

## 2023-10-28 DIAGNOSIS — Z7902 Long term (current) use of antithrombotics/antiplatelets: Secondary | ICD-10-CM | POA: Diagnosis not present

## 2023-10-28 DIAGNOSIS — L97519 Non-pressure chronic ulcer of other part of right foot with unspecified severity: Secondary | ICD-10-CM | POA: Diagnosis not present

## 2023-10-28 DIAGNOSIS — I70229 Atherosclerosis of native arteries of extremities with rest pain, unspecified extremity: Secondary | ICD-10-CM

## 2023-10-28 HISTORY — PX: ABDOMINAL AORTOGRAM W/LOWER EXTREMITY: CATH118223

## 2023-10-28 HISTORY — PX: LOWER EXTREMITY INTERVENTION: CATH118252

## 2023-10-28 LAB — POCT ACTIVATED CLOTTING TIME
Activated Clotting Time: 245 s
Activated Clotting Time: 204 s
Activated Clotting Time: 164 s

## 2023-10-28 LAB — POCT I-STAT, CHEM 8
BUN: 21 mg/dL (ref 8–23)
Calcium, Ion: 0.96 mmol/L — ABNORMAL LOW (ref 1.15–1.40)
Chloride: 111 mmol/L (ref 98–111)
Creatinine, Ser: 1.2 mg/dL (ref 0.61–1.24)
Glucose, Bld: 126 mg/dL — ABNORMAL HIGH (ref 70–99)
HCT: 40 % (ref 39.0–52.0)
Hemoglobin: 13.6 g/dL (ref 13.0–17.0)
Potassium: 4.8 mmol/L (ref 3.5–5.1)
Sodium: 143 mmol/L (ref 135–145)
TCO2: 21 mmol/L — ABNORMAL LOW (ref 22–32)

## 2023-10-28 LAB — GLUCOSE, CAPILLARY: Glucose-Capillary: 89 mg/dL (ref 70–99)

## 2023-10-28 MED ORDER — CLOPIDOGREL BISULFATE 75 MG PO TABS
75.0000 mg | ORAL_TABLET | Freq: Every day | ORAL | Status: DC
  Administered 2023-10-29: 75 mg via ORAL
  Filled 2023-10-28: qty 1

## 2023-10-28 MED ORDER — IODIXANOL 320 MG/ML IV SOLN
INTRAVENOUS | Status: DC | PRN
  Administered 2023-10-28: 140 mL

## 2023-10-28 MED ORDER — ONDANSETRON HCL 4 MG/2ML IJ SOLN: 4.0000 mg | Freq: Four times a day (QID) | INTRAMUSCULAR | Status: DC | PRN

## 2023-10-28 MED ORDER — FENTANYL CITRATE (PF) 100 MCG/2ML IJ SOLN
INTRAMUSCULAR | Status: AC
  Administered 2023-10-28: 50 ug via INTRAVENOUS
  Filled 2023-10-28: qty 2

## 2023-10-28 MED ORDER — LABETALOL HCL 5 MG/ML IV SOLN
INTRAVENOUS | Status: AC
  Filled 2023-10-28: qty 4

## 2023-10-28 MED ORDER — OXYCODONE-ACETAMINOPHEN 5-325 MG PO TABS
1.0000 | ORAL_TABLET | ORAL | Status: DC | PRN
  Administered 2023-10-28 – 2023-10-29 (×2): 2 via ORAL
  Filled 2023-10-28 (×2): qty 2

## 2023-10-28 MED ORDER — LABETALOL HCL 5 MG/ML IV SOLN
INTRAVENOUS | Status: DC | PRN
  Administered 2023-10-28: 20 mg via INTRAVENOUS

## 2023-10-28 MED ORDER — HEPARIN SODIUM (PORCINE) 1000 UNIT/ML IJ SOLN
INTRAMUSCULAR | Status: DC | PRN
  Administered 2023-10-28: 6000 [IU] via INTRAVENOUS

## 2023-10-28 MED ORDER — ASPIRIN 81 MG PO CHEW
CHEWABLE_TABLET | ORAL | Status: AC
  Filled 2023-10-28: qty 1

## 2023-10-28 MED ORDER — HYDRALAZINE HCL 20 MG/ML IJ SOLN
INTRAMUSCULAR | Status: AC
  Filled 2023-10-28: qty 1

## 2023-10-28 MED ORDER — MIDAZOLAM HCL 2 MG/2ML IJ SOLN
INTRAMUSCULAR | Status: DC | PRN
  Administered 2023-10-28 (×2): 1 mg via INTRAVENOUS

## 2023-10-28 MED ORDER — LABETALOL HCL 5 MG/ML IV SOLN
10.0000 mg | INTRAVENOUS | Status: DC | PRN
  Administered 2023-10-29: 10 mg via INTRAVENOUS
  Filled 2023-10-28: qty 4

## 2023-10-28 MED ORDER — HEPARIN (PORCINE) IN NACL 1000-0.9 UT/500ML-% IV SOLN
INTRAVENOUS | Status: DC | PRN
  Administered 2023-10-28 (×2): 500 mL

## 2023-10-28 MED ORDER — MIDAZOLAM HCL 2 MG/2ML IJ SOLN
INTRAMUSCULAR | Status: AC
  Filled 2023-10-28: qty 2

## 2023-10-28 MED ORDER — SODIUM CHLORIDE 0.9 % WEIGHT BASED INFUSION: 1.0000 mL/kg/h | INTRAVENOUS | Status: AC

## 2023-10-28 MED ORDER — CLOPIDOGREL BISULFATE 300 MG PO TABS
ORAL_TABLET | ORAL | Status: DC | PRN
  Administered 2023-10-28: 75 mg via ORAL

## 2023-10-28 MED ORDER — ASPIRIN 81 MG PO TBEC
81.0000 mg | DELAYED_RELEASE_TABLET | Freq: Every day | ORAL | Status: DC
Start: 2023-10-29 — End: 2023-10-29

## 2023-10-28 MED ORDER — LIDOCAINE HCL (PF) 1 % IJ SOLN
INTRAMUSCULAR | Status: AC
Start: 2023-10-28 — End: 2023-10-28
  Filled 2023-10-28: qty 30

## 2023-10-28 MED ORDER — LIDOCAINE HCL (PF) 1 % IJ SOLN
INTRAMUSCULAR | Status: DC | PRN
  Administered 2023-10-28: 15 mL
  Administered 2023-10-28: 10 mL

## 2023-10-28 MED ORDER — HYDRALAZINE HCL 20 MG/ML IJ SOLN
5.0000 mg | INTRAMUSCULAR | Status: AC | PRN
  Administered 2023-10-29: 5 mg via INTRAVENOUS
  Filled 2023-10-28: qty 1

## 2023-10-28 MED ORDER — FENTANYL CITRATE (PF) 100 MCG/2ML IJ SOLN: 50.0000 ug | Freq: Once | INTRAMUSCULAR | Status: AC

## 2023-10-28 MED ORDER — SODIUM CHLORIDE 0.9 % IV SOLN: INTRAVENOUS | Status: DC

## 2023-10-28 MED ORDER — SODIUM CHLORIDE 0.9% FLUSH
3.0000 mL | Freq: Two times a day (BID) | INTRAVENOUS | Status: DC
  Administered 2023-10-29: 3 mL via INTRAVENOUS

## 2023-10-28 MED ORDER — LABETALOL HCL 5 MG/ML IV SOLN
INTRAVENOUS | Status: AC
  Administered 2023-10-28: 10 mg via INTRAVENOUS
  Filled 2023-10-28: qty 4

## 2023-10-28 MED ORDER — FENTANYL CITRATE (PF) 100 MCG/2ML IJ SOLN
INTRAMUSCULAR | Status: DC | PRN
  Administered 2023-10-28 (×2): 50 ug via INTRAVENOUS

## 2023-10-28 MED ORDER — HYDRALAZINE HCL 20 MG/ML IJ SOLN
INTRAMUSCULAR | Status: AC
  Administered 2023-10-28: 5 mg via INTRAVENOUS
  Filled 2023-10-28: qty 1

## 2023-10-28 MED ORDER — HEPARIN SODIUM (PORCINE) 1000 UNIT/ML IJ SOLN
INTRAMUSCULAR | Status: AC
  Filled 2023-10-28: qty 10

## 2023-10-28 MED ORDER — FENTANYL CITRATE (PF) 100 MCG/2ML IJ SOLN
INTRAMUSCULAR | Status: AC
  Filled 2023-10-28: qty 2

## 2023-10-28 MED ORDER — ACETAMINOPHEN 325 MG PO TABS
650.0000 mg | ORAL_TABLET | ORAL | Status: DC | PRN
  Administered 2023-10-28: 650 mg via ORAL
  Filled 2023-10-28: qty 2

## 2023-10-28 MED ORDER — SODIUM CHLORIDE 0.9% FLUSH: 3.0000 mL | INTRAVENOUS | Status: DC | PRN

## 2023-10-28 MED ORDER — SODIUM CHLORIDE 0.9 % IV SOLN
250.0000 mL | INTRAVENOUS | Status: DC | PRN
Start: 2023-10-29 — End: 2023-10-30

## 2023-10-28 MED ORDER — ASPIRIN 81 MG PO CHEW
CHEWABLE_TABLET | ORAL | Status: DC | PRN
  Administered 2023-10-28: 81 mg via ORAL

## 2023-10-28 MED ORDER — CLOPIDOGREL BISULFATE 75 MG PO TABS
ORAL_TABLET | ORAL | Status: AC
  Filled 2023-10-28: qty 1

## 2023-10-28 SURGICAL SUPPLY — 20 items
BALLOON MUSTANG 5X150X135 (BALLOONS) IMPLANT
CATH OMNI FLUSH 5F 65CM (CATHETERS) IMPLANT
CATH QUICKCROSS SUPP .035X90CM (MICROCATHETER) IMPLANT
GLIDEWIRE ADV .035X260CM (WIRE) IMPLANT
GUIDEWIRE ANGLED .035 180CM (WIRE) IMPLANT
KIT ENCORE 26 ADVANTAGE (KITS) IMPLANT
KIT MICROPUNCTURE NIT STIFF (SHEATH) IMPLANT
KIT SINGLE USE MANIFOLD (KITS) IMPLANT
PACK CARDIAC CATHETERIZATION (CUSTOM PROCEDURE TRAY) IMPLANT
SET ATX-X65L (MISCELLANEOUS) IMPLANT
SHEATH CATAPULT 6FR 45 (SHEATH) IMPLANT
SHEATH PINNACLE 5F 10CM (SHEATH) IMPLANT
SHEATH PINNACLE 6F 10CM (SHEATH) IMPLANT
SHEATH PROBE COVER 6X72 (BAG) IMPLANT
STENT ELUVIA 6X150X130 (Permanent Stent) IMPLANT
STENT ELUVIA 6X40X130 (Permanent Stent) IMPLANT
STOPCOCK MORSE 400PSI 3WAY (MISCELLANEOUS) IMPLANT
TUBING CIL FLEX 10 FLL-RA (TUBING) IMPLANT
WIRE BENTSON .035X145CM (WIRE) IMPLANT
WIRE TORQFLEX AUST .018X40CM (WIRE) IMPLANT

## 2023-10-28 NOTE — Interval H&P Note (Signed)
 History and Physical Interval Note:  10/28/2023 10:45 AM  [name].  has presented today for surgery, with the diagnosis of critical lim ischemia rt.  The various methods of treatment have been discussed with the patient and family. After consideration of risks, benefits and other options for treatment, the patient has consented to  Procedure(s): LOWER EXTREMITY INTERVENTION (Right) ABDOMINAL AORTOGRAM W/LOWER EXTREMITY (Bilateral) as a surgical intervention.  The patient's history has been reviewed, patient examined, no change in status, stable for surgery.  I have reviewed the patient's chart and labs.  Questions were answered to the patient's satisfaction.     Debby LOISE Robertson

## 2023-10-28 NOTE — Care Management CC44 (Signed)
 Condition Code 44 Documentation Completed  Patient Details  Name: Ruben J Retana Jr. MRN: [id number] Date of Birth: [birth year]   Condition Code 44 given:  Yes Patient signature on Condition Code 44 notice:  Yes Documentation of 2 MD's agreement:  Yes Code 44 added to claim:  Yes    [name], RN 10/28/2023, 6:47 PM

## 2023-10-28 NOTE — Progress Notes (Signed)
 Site area: L fem artery 6Fr sheath Site Prior to Removal:  Level 0 Pressure Applied For: Manual:   yes Patient Status During Pull:  Stable Post Pull Site:  Level 0 Post Pull Instructions Given:  Yes with teach back Post Pull Pulses Present: +2 DP Dressing Applied:  gauze with tegaderm Bedrest begins @ 1518 Comments: Pt BP elevated. PRN meds given per orders. See eMAR for details. Dr. Magda called for pain medication. Orders given.

## 2023-10-28 NOTE — Care Management Obs Status (Signed)
 MEDICARE OBSERVATION STATUS NOTIFICATION   Patient Details  Name: [name]. MRN: [id number] Date of Birth: 1960/03/09   Medicare Observation Status Notification Given:  Yes    [name], RN 10/28/2023, 6:47 PM

## 2023-10-28 NOTE — Op Note (Signed)
 DATE OF SERVICE: 10/28/2023  PATIENT:  [name].  63 y.o. male  PRE-OPERATIVE DIAGNOSIS:  Atherosclerosis of native arteries of right lower extremity causing ulceration  POST-OPERATIVE DIAGNOSIS:  Same  PROCEDURE:   1) Ultrasound guided left common femoral artery access (CPT [phone]) 2) Aortogram (CPT [phone]) 3) Right lower extreimty angiogram with second order cannulation (CPT [phone]) 4) Right lower extremity angiogram with third order cannulation (CPT [phone]) 5) Right femoropopliteal angioplasty and stenting Eluvia 6x169mm x2, 6x72mm (CPT 37226) 6) Conscious sedation (72 minutes) (CPT 99152)  SURGEON:  Debby SAILOR. Magda, MD  ASSISTANT: none  ANESTHESIA:   local and IV sedation  ESTIMATED BLOOD LOSS: min  LOCAL MEDICATIONS USED:  LIDOCAINE    COUNTS: confirmed correct.  PATIENT DISPOSITION:  PACU - hemodynamically stable.   Delay start of Pharmacological VTE agent (>24hrs) due to surgical blood loss or risk of bleeding: no  INDICATION FOR PROCEDURE: [name]. is a 62 y.o. male with right leg ischemic ulcer. He complaints of left leg pain as well. After careful discussion of risks, benefits, and alternatives the patient was offered angiogram. The patient understood and wished to proceed.  OPERATIVE FINDINGS:   Left renal artery: patent Right renal artery: patent  Infrarenal aorta: Occluded, ABF widely patent  Left common iliac artery: Occluded, ABF widely patent Right common iliac artery: Occluded, ABF widely patent  Left internal iliac artery: Occluded, ABF widely patent Right internal iliac artery: Occluded, ABF widely patent  Left external iliac artery: Occluded, ABF widely patent Right external iliac artery: Occluded, ABF widely patent  Left common femoral artery: patent Right common femoral artery: patent  Left profunda femoris artery: patent Right profunda femoris artery: patent  Left superficial femoral artery: prior stenting widely patent Right  superficial femoral artery: occluded  Left popliteal artery: prior stenting widely patent Right popliteal artery: reconstitutes above the knee  Left anterior tibial artery: patent to the foot Right anterior tibial artery: occluded  Left tibioperoneal trunk: patent Right tibioperoneal trunk: patent  Left peroneal artery: occluded Right peroneal artery: patent  Left posterior tibial artery: occluded Right posterior tibial artery: occluded  Left pedal circulation: fills via DP Right pedal circulation: fills via peroneal collaterals   GLASS score. FP4 / IP 0 / P 0. Stage III  WIfI score. 1 / 3 / 0. Stage III.   DESCRIPTION OF PROCEDURE: After identification of the patient in the pre-operative holding area, the patient was transferred to the operating room. The patient was positioned supine on the operating room table. Anesthesia was induced. The groins was prepped and draped in standard fashion. A surgical pause was performed confirming correct patient, procedure, and operative location.  I attempted to access the right common femoral in antegrade direction, but was unable to cannulate the SFA.  The left groin was anesthetized with subcutaneous injection of 1% lidocaine . Using ultrasound guidance, the left common femoral artery was accessed with micropuncture technique.  Fluoroscopy was used to confirm cannulation over the femoral head. The 641F micropuncture sheath was upsized to 41F.   A Benson wire was advanced into the distal aorta. Over the wire an omni flush catheter was advanced to the level of L2. Aortogram was performed - see above for details.   The right limb of the aortobifemoral bypass was selected with an omniflush catheter and Glidewire advantage guidewire. The wire was advanced into the common femoral artery. Over the wire the omni flush catheter was advanced into the external iliac artery. Selective angiography  was performed - see above for details.   The decision was made to  intervene. The patient was heparinized with 6000 units of heparin . The 38F sheath was exchanged for a 64F x 45cm sheath. Selective angiography of the right lower extremity performed prior to intervention.   The lesions were treated with: Right femoropopliteal angioplasty and stenting Eluvia 6x128mm x2, 6x35mm   Completion angiography revealed:  Resolution of right SFA occlusion. Inline flow to the peroneal.  Runoff of the left leg was performed through the sheath. See above for detail.  The sheath was left in place to be removed in the recovery area.  Conscious sedation was administered with the use of IV fentanyl  and midazolam  under continuous physician and nurse monitoring.  Heart rate, blood pressure, and oxygen saturation were continuously monitored.  Total sedation time was 72 minutes  Upon completion of the case instrument and sharps counts were confirmed correct. The patient was transferred to the PACU in good condition. I was present for all portions of the procedure.  PLAN: ASA / Plavix  / Statin. Follow up with me in 4 weeks with ABI and RLE duplex. Left leg pain is not caused by ischemia.   Debby SAILOR. Magda, MD [facility] Phone Number: [phone] 10/28/2023 10:46 AM

## 2023-10-28 NOTE — Plan of Care (Signed)
   Problem: Health Behavior/Discharge Planning: Goal: Ability to manage health-related needs will improve Outcome: Progressing

## 2023-10-28 NOTE — Plan of Care (Signed)

## 2023-10-29 ENCOUNTER — Other Ambulatory Visit (HOSPITAL_COMMUNITY): Payer: Self-pay

## 2023-10-29 DIAGNOSIS — I70221 Atherosclerosis of native arteries of extremities with rest pain, right leg: Secondary | ICD-10-CM

## 2023-10-29 DIAGNOSIS — Z95820 Peripheral vascular angioplasty status with implants and grafts: Secondary | ICD-10-CM

## 2023-10-29 DIAGNOSIS — I70235 Atherosclerosis of native arteries of right leg with ulceration of other part of foot: Secondary | ICD-10-CM | POA: Diagnosis not present

## 2023-10-29 LAB — LIPID PANEL
Cholesterol: 131 mg/dL (ref 0–200)
HDL: 38 mg/dL — ABNORMAL LOW (ref >40)
LDL Cholesterol: 79 mg/dL (ref 0–99)
Total CHOL/HDL Ratio: 3.4 ratio
Triglycerides: 68 mg/dL (ref <150)
VLDL: 14 mg/dL (ref 0–40)

## 2023-10-29 MED ORDER — EZETIMIBE 10 MG PO TABS
10.0000 mg | ORAL_TABLET | Freq: Every day | ORAL | 11 refills | Status: AC
  Filled 2023-10-29: qty 30, 30d supply, fill #0

## 2023-10-29 NOTE — Progress Notes (Signed)
  Daily Progress Note  S/p: Right lower extremity angiogram with angioplasty and stenting of the right femoropopliteal segment  Subjective: Doing well this morning, having some throbbing pain in the right foot  Objective: Vitals:   10/29/23 0723 10/29/23 0801  BP: (!) 186/74 (!) 156/78  Pulse:  74  Resp:  18  Temp:  98.5 F (36.9 C)  SpO2: 98%     Physical Examination HDS Nonlabored breathing Multiphasic DP on left and PT on right  ASSESSMENT/PLAN:  With CL TI with rest pain and tissue loss who underwent right lower extremity angiogram yesterday with femoral-popliteal stenting.  His left groin access site looks good without hematoma and has multiphasic signals distally Plan for discharge today. Will have follow-up scheduled by our office in the next 4 to 6 weeks   [name] Serve MD [facility] [phone] 10/29/2023  8:44 AM

## 2023-10-29 NOTE — Discharge Instructions (Signed)

## 2023-10-29 NOTE — Progress Notes (Signed)
 PHARMACIST LIPID MONITORING [name]. is a 63 y.o. male admitted on 10/28/2023 with PVD.  Pharmacy has been consulted to optimize lipid-lowering therapy with the indication of secondary prevention for clinical ASCVD.  Recent Labs:  Lipid Panel (last 6 months):   Lab Results  Component Value Date   CHOL 131 10/29/2023   TRIG 68 10/29/2023   HDL 38 (L) 10/29/2023   CHOLHDL 3.4 10/29/2023   VLDL 14 10/29/2023   LDLCALC 79 10/29/2023   Hepatic function panel (last 6 months):   No results found for: AST, ALT, ALKPHOS, BILITOT, BILIDIR, IBILI  SCr (since admission):   Serum creatinine: 1.2 mg/dL 89/96/74 9150 Estimated creatinine clearance: 47.6 mL/min  Current therapy and lipid therapy tolerance Current lipid-lowering therapy: atorvastatin  80 mg daily Previous lipid-lowering therapies (if applicable): n/a Documented or reported allergies or intolerances to lipid-lowering therapies (if applicable): none  Assessment:   Patient agrees with changes to lipid-lowering therapy. LDL slightly above goal < 70 at 79, discussed adding ezetimibe  10mg  daily, patient agreeable.   Plan:    1.Statin intensity (high intensity recommended for all patients regardless of the LDL):  No statin changes. The patient is already on a high intensity statin.  2.Add ezetimibe  (if any one of the following):   On a high intensity statin with LDL > 70. Adding ezetimibe  10mg  daily per protocol  3.Refer to lipid clinic:   No  4.Follow-up with:  Primary care provider - Arloa Jarvis, NP  5.Follow-up labs after discharge:  Changes in lipid therapy were made. Check a lipid panel in 8-12 weeks then annually.     Maurilio Patten, PharmD PGY1 Pharmacy Resident [facility] 10/29/2023 8:40 AM

## 2023-10-29 NOTE — Progress Notes (Signed)
 Discharge  Pt verbally understands D/C POC.  Additional education reviewed with copies placed in AVS.  PIV removed Dsg intact, Tele removed CCMD/Holly.   TOC meds in process Dischargd charge lounge called for pickup and taxi needed.

## 2023-10-31 ENCOUNTER — Encounter (HOSPITAL_COMMUNITY): Payer: Self-pay | Admitting: Vascular Surgery

## 2023-10-31 LAB — GLUCOSE, CAPILLARY: Glucose-Capillary: 86 mg/dL (ref 70–99)

## 2023-10-31 NOTE — Discharge Summary (Signed)
  Discharge Summary  Patient ID: [name] [id number] 63 y.o. 02-Dec-1960  Admit date: 10/28/2023  Discharge date and time: 10/29/2023  9:49 AM   Admitting Physician: Debby LOISE Robertson, MD   Discharge Physician: same  Admission Diagnoses: PAD (peripheral artery disease) [I73.9]  Discharge Diagnoses: same  Admission Condition: fair  Discharged Condition: fair  Indication for Admission: observation  Hospital Course: [name] is a 63 year old male with a right foot ulceration who was brought in as an outpatient and underwent right femoral-popliteal angioplasty and stenting by Dr. Robertson on 10/28/2023.  He tolerated the procedure well and was kept in observation overnight.  POD #1 groin access site was without hematoma.  He will follow-up in the office in 4 to 6 weeks with right lower extremity arterial duplex and ABI.  He was discharged home in stable condition.  Consults: None  Treatments: surgery: Aortogram with right femoral-popliteal angioplasty and stenting by Dr. Robertson on 10/28/2023  Discharge Exam: See progress note 10/29/23 Vitals:   10/29/23 0723 10/29/23 0801  BP: (!) 186/74 (!) 156/78  Pulse:  74  Resp:  18  Temp:  98.5 F (36.9 C)  SpO2: 98%      Disposition: Discharge disposition: 01-Home or Self Care       Patient Instructions:  Allergies as of 10/29/2023   No Known Allergies      Medication List     TAKE these medications    aspirin  EC 81 MG tablet Take 1 tablet (81 mg total) by mouth daily. Swallow whole.   atorvastatin  80 MG tablet Commonly known as: LIPITOR  Take 1 tablet (80 mg total) by mouth at bedtime.   Blood Pressure Monitor Kit Check blood pressure daily What changed:  how much to take how to take this when to take this additional instructions   clopidogrel  75 MG tablet Commonly known as: Plavix  Take 1 tablet (75 mg total) by mouth daily.   DULoxetine  30 MG capsule Commonly known as: CYMBALTA  Take 30  mg by mouth daily in the afternoon.   ezetimibe  10 MG tablet Commonly known as: Zetia  Take 1 tablet (10 mg total) by mouth daily.   gabapentin  600 MG tablet Commonly known as: NEURONTIN  Take 1 tablet (600 mg) in morning and afternoon. Take 2 tablets (1200 mg) at night What changed:  how much to take how to take this when to take this additional instructions   lisinopril  10 MG tablet Commonly known as: ZESTRIL  Take 10 mg by mouth daily.   multivitamin with minerals Tabs tablet Take 1 tablet by mouth daily.   oxyCODONE  5 MG immediate release tablet Commonly known as: Oxy IR/ROXICODONE  Take 1 tablet (5 mg total) by mouth every 8 (eight) hours as needed for severe pain (pain score 7-10).   pantoprazole  20 MG tablet Commonly known as: PROTONIX  Take 20 mg by mouth daily in the afternoon.   Vitamin D3 Ultra Potency 1250 MCG Tabs Generic drug: Cholecalciferol Take 50,000 Units by mouth once a week. Thursdays or Fridays       Activity: activity as tolerated Diet: regular diet Wound Care: none needed  Follow-up with [facility] in 6 weeks.  SignedBETHA Donnice Sender, PA-C 10/31/2023 11:02 AM [facility] Office: [phone]

## 2023-11-04 ENCOUNTER — Other Ambulatory Visit: Payer: Self-pay

## 2023-11-04 DIAGNOSIS — I70235 Atherosclerosis of native arteries of right leg with ulceration of other part of foot: Secondary | ICD-10-CM

## 2023-11-10 ENCOUNTER — Telehealth: Payer: Self-pay

## 2023-11-10 NOTE — Telephone Encounter (Signed)
 Patient has called multiple times asking for pain medication.  11/10/23 @ 1218 Called patient, VM full, could not leave message to call back.

## 2023-11-29 ENCOUNTER — Ambulatory Visit (HOSPITAL_COMMUNITY): Attending: Nurse Practitioner

## 2023-11-29 ENCOUNTER — Ambulatory Visit (HOSPITAL_COMMUNITY)

## 2023-11-29 ENCOUNTER — Ambulatory Visit: Attending: Nurse Practitioner

## 2023-11-29 NOTE — Progress Notes (Deleted)
 HISTORY AND PHYSICAL     CC:  follow up. Requesting Provider:  Arloa Jarvis, NP  HPI: This is a 63 y.o. male who is here today for follow up for PAD.  Pt has hx of: -DCB right SFA 07/16/2019 Dr. Court -right CIA drug eluting stent 12/19/2019 Dr. Darron -stent left SFA, popliteal and left CIA 06/10/2020 Dr. Serene -ABF bypass, bilateral femoral endarterectomy 10/09/2020 Dr. Magda -left CFA to BK popliteal artery bypass with GSV in subcu tunnel 12/22/2022 Dr. Magda -left femoropopliteal angioplasty and stenting 04/08/2023 Dr. Magda -right femoropopliteal angioplasty and stenting 10/28/2023 Dr. Magda  The pt returns today for follow up.  ***  The pt is on a statin for cholesterol management.    The pt is on an aspirin .    Other AC:  plavix  The pt is on ACEI for hypertension.  The pt is not on diabetic medication. Tobacco hx:  current  Pt does *** have family hx of AAA.  Past Medical History:  Diagnosis Date   Diabetes mellitus without complication (HCC)    Hyperlipidemia    Hypertension    Peripheral vascular disease    Tobacco use     Past Surgical History:  Procedure Laterality Date   ABDOMINAL AORTOGRAM W/LOWER EXTREMITY Left 07/16/2019   Procedure: ABDOMINAL AORTOGRAM W/LOWER EXTREMITY;  Surgeon: Court Dorn PARAS, MD;  Location: [facility];  Service: Cardiovascular;  Laterality: Left;   ABDOMINAL AORTOGRAM W/LOWER EXTREMITY Left 08/16/2019   ABDOMINAL AORTOGRAM W/LOWER EXTREMITY N/A 08/16/2019   Procedure: ABDOMINAL AORTOGRAM W/LOWER EXTREMITY;  Surgeon: Court Dorn PARAS, MD;  Location: [facility];  Service: Cardiovascular;  Laterality: N/A;   ABDOMINAL AORTOGRAM W/LOWER EXTREMITY Bilateral 12/19/2019   Procedure: ABDOMINAL AORTOGRAM W/LOWER EXTREMITY;  Surgeon: Darron Deatrice LABOR, MD;  Location: [facility];  Service: Cardiovascular;  Laterality: Bilateral;   ABDOMINAL AORTOGRAM W/LOWER EXTREMITY Left 06/10/2020   Procedure: ABDOMINAL  AORTOGRAM W/LOWER EXTREMITY;  Surgeon: Serene Gaile ORN, MD;  Location: [facility];  Service: Cardiovascular;  Laterality: Left;   ABDOMINAL AORTOGRAM W/LOWER EXTREMITY N/A 10/06/2020   Procedure: ABDOMINAL AORTOGRAM W/LOWER EXTREMITY;  Surgeon: Sheree Penne Bruckner, MD;  [facility]: [facility];  Service: Cardiovascular;  Laterality: N/A;   ABDOMINAL AORTOGRAM W/LOWER EXTREMITY Left 12/17/2022   Procedure: ABDOMINAL AORTOGRAM W/LOWER EXTREMITY;  Surgeon: Magda Debby SAILOR, MD;  Location: [facility];  Service: Cardiovascular;  Laterality: Left;   ABDOMINAL AORTOGRAM W/LOWER EXTREMITY N/A 04/08/2023   Procedure: ABDOMINAL AORTOGRAM W/LOWER EXTREMITY;  Surgeon: Magda Debby SAILOR, MD;  Location: [facility];  Service: Cardiovascular;  Laterality: N/A;   ABDOMINAL AORTOGRAM W/LOWER EXTREMITY Bilateral 10/28/2023   Procedure: ABDOMINAL AORTOGRAM W/LOWER EXTREMITY;  Surgeon: Magda Debby SAILOR, MD;  Location: [facility];  Service: Cardiovascular;  Laterality: Bilateral;   AMPUTATION TOE Left 06/13/2020   Procedure: Left Fifth Toe Amputation ;  Surgeon: Silva Juliene SAUNDERS, DPM;  Location[facility];  Service: Podiatry;  Laterality: Left;   AORTA - BILATERAL FEMORAL ARTERY BYPASS GRAFT Bilateral 10/09/2020   Procedure: AORTA BIFEMORAL BYPASS GRAFT;  Surgeon: Magda Debby SAILOR, MD;  Location: [facility] OR;  Service: Vascular;  Laterality: Bilateral;   arm surgery Right    ENDARTERECTOMY FEMORAL Bilateral 10/09/2020   Procedure: ENDARTERECTOMY COMMON FEMORAL;  Surgeon: Magda Debby SAILOR, MD;  Location: [facility] OR;  Service: Vascular;  Laterality: Bilateral;   FEMORAL-POPLITEAL BYPASS GRAFT Left 12/22/2022   Procedure: LEFT COMMON FEMORAL-BELOW KNEE ARTERY BYPASS WITH SAPHENOUS VEIN  HARVEST;  Surgeon: Magda Debby SAILOR, MD;  Location: [facility] OR;  Service: Vascular;  Laterality: Left;   LOWER EXTREMITY INTERVENTION Left 04/08/2023   Procedure: LOWER EXTREMITY INTERVENTION;  Surgeon: Magda Debby SAILOR,  MD;  Location: [facility];  Service: Cardiovascular;  Laterality: Left;  SFA   LOWER EXTREMITY INTERVENTION Right 10/28/2023   Procedure: LOWER EXTREMITY INTERVENTION;  Surgeon: Magda Debby SAILOR, MD;  Location: [facility];  Service: Cardiovascular;  Laterality: Right;   PERIPHERAL VASCULAR BALLOON ANGIOPLASTY  08/16/2019   Procedure: PERIPHERAL VASCULAR BALLOON ANGIOPLASTY;  Surgeon: Court Dorn PARAS, MD;  Location: [facility];  Service: Cardiovascular;;  attempted PTA of Left SFA   PERIPHERAL VASCULAR INTERVENTION Right 07/16/2019   Procedure: PERIPHERAL VASCULAR INTERVENTION;  Surgeon: Court Dorn PARAS, MD;  Location: [facility];  Service: Cardiovascular;  Laterality: Right;   PERIPHERAL VASCULAR INTERVENTION Right 12/19/2019   Procedure: PERIPHERAL VASCULAR INTERVENTION;  Surgeon: Darron Deatrice LABOR, MD;  Location: [facility];  Service: Cardiovascular;  Laterality: Right;  iliac   PERIPHERAL VASCULAR INTERVENTION Left 06/10/2020   Procedure: PERIPHERAL VASCULAR INTERVENTION;  Surgeon: Serene Gaile ORN, MD;  Location: [facility];  Service: Cardiovascular;  Laterality: Left;  SFA /COMMON ILIAC    No Known Allergies  Current Outpatient Medications  Medication Sig Dispense Refill   aspirin  EC 81 MG tablet Take 1 tablet (81 mg total) by mouth daily. Swallow whole.     atorvastatin  (LIPITOR ) 80 MG tablet Take 1 tablet (80 mg total) by mouth at bedtime. 30 tablet 1   Blood Pressure Monitor KIT Check blood pressure daily (Patient taking differently: 1 each by Other route once a week.) 1 kit 0   Cholecalciferol (VITAMIN D3 ULTRA POTENCY) 1.25 MG (50000 UT) TABS Take 50,000 Units by mouth once a week. Thursdays or Fridays     clopidogrel  (PLAVIX ) 75 MG tablet Take 1 tablet (75 mg total) by mouth daily. 30 tablet 11   DULoxetine  (CYMBALTA ) 30 MG capsule Take 30 mg by mouth daily in the afternoon.     ezetimibe  (ZETIA ) 10 MG tablet Take 1 tablet (10 mg  total) by mouth daily. 30 tablet 11   gabapentin  (NEURONTIN ) 600 MG tablet Take 1 tablet (600 mg) in morning and afternoon. Take 2 tablets (1200 mg) at night (Patient taking differently: Take 600 mg by mouth 3 (three) times daily.) 120 tablet 0   lisinopril  (ZESTRIL ) 10 MG tablet Take 10 mg by mouth daily.     Multiple Vitamin (MULTIVITAMIN WITH MINERALS) TABS tablet Take 1 tablet by mouth daily.     oxyCODONE  (OXY IR/ROXICODONE ) 5 MG immediate release tablet Take 1 tablet (5 mg total) by mouth every 8 (eight) hours as needed for severe pain (pain score 7-10). 20 tablet 0   pantoprazole  (PROTONIX ) 20 MG tablet Take 20 mg by mouth daily in the afternoon.     Current Facility-Administered Medications  Medication Dose Route Frequency Provider Last Rate Last Admin   sodium chloride  flush (NS) 0.9 % injection 3 mL  3 mL Intravenous Q12H Court Dorn PARAS, MD        Family History  Problem Relation Age of Onset   Cancer Mother    Diabetes Father     Social History   Socioeconomic History   Marital status: Legally Separated    Spouse name: Not on file   Number of children: Not on file   Years of education: Not on file  Highest education level: Not on file  Occupational History   Not on file  Tobacco Use   Smoking status: Every Day    Current packs/day: 0.50    Average packs/day: 0.5 packs/day for 40.0 years (20.0 ttl pk-yrs)    Types: Cigarettes   Smokeless tobacco: Never  Vaping Use   Vaping status: Never Used  Substance and Sexual Activity   Alcohol use: Yes    Comment: beer and liquor- maybe a few drinks on the weekends   Drug use: Yes    Types: Marijuana, Cocaine    Comment: pt states he has not used cocaine in about 5-6 months. Pt last smoked marijuana today 12/20/22   Sexual activity: Not on file  Other Topics Concern   Not on file  Social History Narrative   Not on file   Social Drivers of Health   Financial Resource Strain: Not on file  Food Insecurity: No Food  Insecurity (10/28/2023)   Hunger Vital Sign    Worried About Running Out of Food in the Last Year: Never true    Ran Out of Food in the Last Year: Never true  Transportation Needs: No Transportation Needs (10/28/2023)   PRAPARE - Administrator, Civil Service (Medical): No    Lack of Transportation (Non-Medical): No  Physical Activity: Not on file  Stress: Not on file  Social Connections: Not on file  Intimate Partner Violence: Not At Risk (10/28/2023)   Humiliation, Afraid, Rape, and Kick questionnaire    Fear of Current or Ex-Partner: No    Emotionally Abused: No    Physically Abused: No    Sexually Abused: No     REVIEW OF SYSTEMS:  *** [X]  denotes positive finding, [ ]  denotes negative finding Cardiac  Comments:  Chest pain or chest pressure:    Shortness of breath upon exertion:    Short of breath when lying flat:    Irregular heart rhythm:        Vascular    Pain in calf, thigh, or hip brought on by ambulation:    Pain in feet at night that wakes you up from your sleep:     Blood clot in your veins:    Leg swelling:         Pulmonary    Oxygen at home:    Productive cough:     Wheezing:         Neurologic    Sudden weakness in arms or legs:     Sudden numbness in arms or legs:     Sudden onset of difficulty speaking or slurred speech:    Temporary loss of vision in one eye:     Problems with dizziness:         Gastrointestinal    Blood in stool:     Vomited blood:         Genitourinary    Burning when urinating:     Blood in urine:        Psychiatric    Major depression:         Hematologic    Bleeding problems:    Problems with blood clotting too easily:        Skin    Rashes or ulcers:        Constitutional    Fever or chills:      PHYSICAL EXAMINATION:  ***  General:  WDWN in NAD; vital signs documented above Gait: Not observed HENT: WNL, normocephalic Pulmonary:  normal non-labored breathing , without wheezing Cardiac:  {Desc; regular/irreg:14544} HR, {With/Without:20273} carotid bruit*** Abdomen: soft, NT; aortic pulse is *** palpable Skin: {With/Without:20273} rashes Vascular Exam/Pulses:  Right Left  Radial {Exam; arterial pulse strength 0-4:30167} {Exam; arterial pulse strength 0-4:30167}  Femoral {Exam; arterial pulse strength 0-4:30167} {Exam; arterial pulse strength 0-4:30167}  Popliteal {Exam; arterial pulse strength 0-4:30167} {Exam; arterial pulse strength 0-4:30167}  DP {Exam; arterial pulse strength 0-4:30167} {Exam; arterial pulse strength 0-4:30167}  PT {Exam; arterial pulse strength 0-4:30167} {Exam; arterial pulse strength 0-4:30167}  Peroneal *** ***   Extremities: {With/Without:20273} ischemic changes, {With/Without:20273} Gangrene , {With/Without:20273} cellulitis; {With/Without:20273} open wounds Musculoskeletal: no muscle wasting or atrophy  Neurologic: A&O X 3 Psychiatric:  The pt has {Desc; normal/abnormal:11317::Normal} affect.   Non-Invasive Vascular Imaging:   ABI's/TBI's on 11/29/2023: Right:  *** - Great toe pressure: *** Left:  *** - Great toe pressure: ***  Arterial duplex on 11/29/2023: ***  Previous ABI's/TBI's on 09/14/2023: Right:  0.57/0 - Great toe pressure: 0 Left:  0.82/0.57 - Great toe pressure:  75  Previous arterial duplex on 09/14/2023: Left: Patent stent with no stenosis.  50-74% stenosis in the TPT.     ASSESSMENT/PLAN:: 63 y.o. male here for follow up for PAD with hx of:  -DCB right SFA 07/16/2019 Dr. Court -right CIA drug eluting stent 12/19/2019 Dr. Darron -stent left SFA, popliteal and left CIA 06/10/2020 Dr. Serene -ABF bypass, bilateral femoral endarterectomy 10/09/2020 Dr. Magda -left CFA to BK popliteal artery bypass with GSV in subcu tunnel 12/22/2022 Dr. Magda -left femoropopliteal angioplasty and stenting 04/08/2023 Dr. Magda -right femoropopliteal angioplasty and stenting 10/28/2023 Dr. Magda   -*** -continue *** -discussed  importance of increased walking daily -pt will f/u in *** with ***.   Lucie Apt, [facility] [phone]  Clinic MD:   Magda

## 2024-01-31 ENCOUNTER — Ambulatory Visit

## 2024-01-31 ENCOUNTER — Ambulatory Visit (HOSPITAL_COMMUNITY)

## 2024-02-28 ENCOUNTER — Ambulatory Visit

## 2024-02-28 ENCOUNTER — Ambulatory Visit (HOSPITAL_COMMUNITY)

## 2024-02-28 ENCOUNTER — Ambulatory Visit (HOSPITAL_COMMUNITY): Admission: RE | Admit: 2024-02-28 | Source: Ambulatory Visit
